# Patient Record
Sex: Female | Born: 1955
Health system: Southern US, Community
[De-identification: ages and names within clinical notes are randomized; demographics above are authoritative.]

## PROBLEM LIST (undated history)

## (undated) DIAGNOSIS — N189 Chronic kidney disease, unspecified: Secondary | ICD-10-CM

## (undated) DIAGNOSIS — Z91018 Allergy to other foods: Secondary | ICD-10-CM

## (undated) DIAGNOSIS — N819 Female genital prolapse, unspecified: Secondary | ICD-10-CM

## (undated) DIAGNOSIS — M21612 Bunion of left foot: Secondary | ICD-10-CM

## (undated) DIAGNOSIS — Z9889 Other specified postprocedural states: Secondary | ICD-10-CM

## (undated) DIAGNOSIS — I480 Paroxysmal atrial fibrillation: Secondary | ICD-10-CM

## (undated) DIAGNOSIS — M21611 Bunion of right foot: Secondary | ICD-10-CM

## (undated) DIAGNOSIS — A159 Respiratory tuberculosis unspecified: Secondary | ICD-10-CM

## (undated) DIAGNOSIS — H269 Unspecified cataract: Secondary | ICD-10-CM

## (undated) DIAGNOSIS — Z9109 Other allergy status, other than to drugs and biological substances: Secondary | ICD-10-CM

## (undated) DIAGNOSIS — K219 Gastro-esophageal reflux disease without esophagitis: Secondary | ICD-10-CM

## (undated) DIAGNOSIS — Z87442 Personal history of urinary calculi: Secondary | ICD-10-CM

## (undated) DIAGNOSIS — F419 Anxiety disorder, unspecified: Secondary | ICD-10-CM

## (undated) DIAGNOSIS — T7840XA Allergy, unspecified, initial encounter: Secondary | ICD-10-CM

## (undated) DIAGNOSIS — G479 Sleep disorder, unspecified: Secondary | ICD-10-CM

## (undated) DIAGNOSIS — M1990 Unspecified osteoarthritis, unspecified site: Secondary | ICD-10-CM

## (undated) DIAGNOSIS — R011 Cardiac murmur, unspecified: Secondary | ICD-10-CM

## (undated) DIAGNOSIS — R0600 Dyspnea, unspecified: Secondary | ICD-10-CM

## (undated) DIAGNOSIS — D509 Iron deficiency anemia, unspecified: Secondary | ICD-10-CM

## (undated) DIAGNOSIS — R002 Palpitations: Secondary | ICD-10-CM

## (undated) DIAGNOSIS — Z9981 Dependence on supplemental oxygen: Secondary | ICD-10-CM

## (undated) DIAGNOSIS — I517 Cardiomegaly: Secondary | ICD-10-CM

## (undated) DIAGNOSIS — T8859XA Other complications of anesthesia, initial encounter: Secondary | ICD-10-CM

## (undated) DIAGNOSIS — R112 Nausea with vomiting, unspecified: Secondary | ICD-10-CM

## (undated) DIAGNOSIS — M79672 Pain in left foot: Secondary | ICD-10-CM

## (undated) DIAGNOSIS — E039 Hypothyroidism, unspecified: Secondary | ICD-10-CM

## (undated) DIAGNOSIS — R32 Unspecified urinary incontinence: Secondary | ICD-10-CM

## (undated) DIAGNOSIS — I10 Essential (primary) hypertension: Secondary | ICD-10-CM

## (undated) DIAGNOSIS — J189 Pneumonia, unspecified organism: Secondary | ICD-10-CM

## (undated) DIAGNOSIS — R0902 Hypoxemia: Secondary | ICD-10-CM

## (undated) DIAGNOSIS — M2550 Pain in unspecified joint: Secondary | ICD-10-CM

## (undated) DIAGNOSIS — E669 Obesity, unspecified: Secondary | ICD-10-CM

## (undated) DIAGNOSIS — I499 Cardiac arrhythmia, unspecified: Secondary | ICD-10-CM

## (undated) DIAGNOSIS — K829 Disease of gallbladder, unspecified: Secondary | ICD-10-CM

## (undated) DIAGNOSIS — R600 Localized edema: Secondary | ICD-10-CM

## (undated) DIAGNOSIS — Z96653 Presence of artificial knee joint, bilateral: Secondary | ICD-10-CM

## (undated) DIAGNOSIS — F32A Depression, unspecified: Secondary | ICD-10-CM

## (undated) DIAGNOSIS — T4145XA Adverse effect of unspecified anesthetic, initial encounter: Secondary | ICD-10-CM

## (undated) HISTORY — DX: Palpitations: R00.2

## (undated) HISTORY — PX: TONSILLECTOMY: SUR1361

## (undated) HISTORY — PX: EYE SURGERY: SHX253

## (undated) HISTORY — DX: Localized edema: R60.0

## (undated) HISTORY — DX: Hypoxemia: R09.02

## (undated) HISTORY — DX: Chronic kidney disease, unspecified: N18.9

## (undated) HISTORY — DX: Pain in unspecified joint: M25.50

## (undated) HISTORY — DX: Female genital prolapse, unspecified: N81.9

## (undated) HISTORY — DX: Pain in left foot: M79.672

## (undated) HISTORY — PX: APPENDECTOMY: SHX54

## (undated) HISTORY — DX: Unspecified osteoarthritis, unspecified site: M19.90

## (undated) HISTORY — DX: Allergy, unspecified, initial encounter: T78.40XA

## (undated) HISTORY — DX: Paroxysmal atrial fibrillation: I48.0

## (undated) HISTORY — DX: Cardiomegaly: I51.7

## (undated) HISTORY — DX: Iron deficiency anemia, unspecified: D50.9

## (undated) HISTORY — DX: Bunion of left foot: M21.611

## (undated) HISTORY — DX: Allergy to other foods: Z91.018

## (undated) HISTORY — DX: Depression, unspecified: F32.A

## (undated) HISTORY — DX: Gastro-esophageal reflux disease without esophagitis: K21.9

## (undated) HISTORY — PX: CHOLECYSTECTOMY: SHX55

## (undated) HISTORY — DX: Dyspnea, unspecified: R06.00

## (undated) HISTORY — DX: Presence of artificial knee joint, bilateral: Z96.653

## (undated) HISTORY — DX: Obesity, unspecified: E66.9

## (undated) HISTORY — DX: Anxiety disorder, unspecified: F41.9

## (undated) HISTORY — DX: Unspecified cataract: H26.9

## (undated) HISTORY — PX: TUBAL LIGATION: SHX77

## (undated) HISTORY — DX: Disease of gallbladder, unspecified: K82.9

## (undated) HISTORY — DX: Bunion of left foot: M21.612

## (undated) HISTORY — DX: Bunion of right foot: M21.611

---

## 1997-04-16 HISTORY — PX: JOINT REPLACEMENT: SHX530

## 1998-02-21 ENCOUNTER — Inpatient Hospital Stay (HOSPITAL_COMMUNITY): Admission: RE | Admit: 1998-02-21 | Discharge: 1998-02-25 | Payer: Self-pay | Admitting: Orthopedic Surgery

## 1998-02-21 ENCOUNTER — Encounter: Payer: Self-pay | Admitting: Orthopedic Surgery

## 1998-03-14 ENCOUNTER — Encounter: Admission: RE | Admit: 1998-03-14 | Discharge: 1998-06-12 | Payer: Self-pay | Admitting: Orthopedic Surgery

## 2006-09-23 ENCOUNTER — Inpatient Hospital Stay (HOSPITAL_COMMUNITY): Admission: EM | Admit: 2006-09-23 | Discharge: 2006-09-30 | Payer: Self-pay | Admitting: Emergency Medicine

## 2006-09-23 ENCOUNTER — Ambulatory Visit: Payer: Self-pay | Admitting: Pulmonary Disease

## 2006-10-03 ENCOUNTER — Ambulatory Visit: Payer: Self-pay | Admitting: Internal Medicine

## 2006-10-16 ENCOUNTER — Ambulatory Visit: Payer: Self-pay | Admitting: Internal Medicine

## 2006-11-11 ENCOUNTER — Ambulatory Visit: Payer: Self-pay | Admitting: Internal Medicine

## 2006-11-11 LAB — CONVERTED CEMR LAB
BUN: 7 mg/dL (ref 6–23)
Calcium: 9.5 mg/dL (ref 8.4–10.5)
Chloride: 102 meq/L (ref 96–112)
Creatinine, Ser: 0.7 mg/dL (ref 0.4–1.2)
Pro B Natriuretic peptide (BNP): 72 pg/mL (ref 0.0–100.0)
Sed Rate: 72 mm/hr — ABNORMAL HIGH (ref 0–25)

## 2006-11-15 ENCOUNTER — Ambulatory Visit: Payer: Self-pay | Admitting: Internal Medicine

## 2006-11-15 ENCOUNTER — Ambulatory Visit: Payer: Self-pay | Admitting: Emergency Medicine

## 2006-11-16 ENCOUNTER — Ambulatory Visit (HOSPITAL_BASED_OUTPATIENT_CLINIC_OR_DEPARTMENT_OTHER): Admission: RE | Admit: 2006-11-16 | Discharge: 2006-11-16 | Payer: Self-pay | Admitting: Emergency Medicine

## 2006-11-26 ENCOUNTER — Ambulatory Visit: Payer: Self-pay

## 2006-11-27 ENCOUNTER — Ambulatory Visit: Payer: Self-pay | Admitting: Pulmonary Disease

## 2006-12-12 ENCOUNTER — Ambulatory Visit: Payer: Self-pay | Admitting: Emergency Medicine

## 2006-12-17 ENCOUNTER — Ambulatory Visit: Payer: Self-pay | Admitting: Internal Medicine

## 2006-12-27 ENCOUNTER — Ambulatory Visit: Payer: Self-pay | Admitting: Emergency Medicine

## 2007-01-03 ENCOUNTER — Ambulatory Visit: Payer: Self-pay | Admitting: Emergency Medicine

## 2007-03-19 ENCOUNTER — Ambulatory Visit: Payer: Self-pay | Admitting: Emergency Medicine

## 2007-04-21 ENCOUNTER — Ambulatory Visit: Payer: Self-pay | Admitting: Emergency Medicine

## 2007-04-21 DIAGNOSIS — R0902 Hypoxemia: Secondary | ICD-10-CM | POA: Insufficient documentation

## 2007-04-21 DIAGNOSIS — I10 Essential (primary) hypertension: Secondary | ICD-10-CM | POA: Insufficient documentation

## 2007-04-21 DIAGNOSIS — K219 Gastro-esophageal reflux disease without esophagitis: Secondary | ICD-10-CM | POA: Insufficient documentation

## 2007-04-21 DIAGNOSIS — E678 Other specified hyperalimentation: Secondary | ICD-10-CM | POA: Insufficient documentation

## 2007-05-20 ENCOUNTER — Ambulatory Visit: Payer: Self-pay | Admitting: Emergency Medicine

## 2007-07-16 ENCOUNTER — Ambulatory Visit: Payer: Self-pay | Admitting: Emergency Medicine

## 2007-07-16 DIAGNOSIS — J8409 Other alveolar and parieto-alveolar conditions: Secondary | ICD-10-CM | POA: Insufficient documentation

## 2007-08-06 ENCOUNTER — Encounter: Payer: Self-pay | Admitting: Emergency Medicine

## 2007-08-18 ENCOUNTER — Ambulatory Visit: Payer: Self-pay | Admitting: Emergency Medicine

## 2007-09-17 ENCOUNTER — Ambulatory Visit: Payer: Self-pay | Admitting: Internal Medicine

## 2007-10-16 ENCOUNTER — Ambulatory Visit: Payer: Self-pay | Admitting: Emergency Medicine

## 2007-11-17 ENCOUNTER — Ambulatory Visit: Payer: Self-pay | Admitting: Internal Medicine

## 2007-11-27 ENCOUNTER — Telehealth (INDEPENDENT_AMBULATORY_CARE_PROVIDER_SITE_OTHER): Payer: Self-pay | Admitting: *Deleted

## 2007-12-01 ENCOUNTER — Telehealth (INDEPENDENT_AMBULATORY_CARE_PROVIDER_SITE_OTHER): Payer: Self-pay | Admitting: *Deleted

## 2010-08-29 NOTE — Assessment & Plan Note (Signed)
[facility]                             PULMONARY OFFICE NOTE   Rebekah, [name]                           MRN:          [id number]  DATE:11/11/2006                            DOB:          [birth year]    HISTORY OF PRESENT ILLNESS:  Patient is a 55 year old morbidly obese  white female patient of Dr. Sherene Sires, who was recently in the hospital, June  9 through June 16 with hypoxic respiratory failure with diffuse  infiltrates on chest x-ray of unclear etiology, associated with  moderately elevated sed rate and a normal BNP.  Patient had dramatic  improvement symptomatically with empiric treatment with Avelox plus  prednisone and oxygen therapy.  Patient returns today, reporting that  she had been substantially improving to the point where she was using  oxygen mainly at bedtime and with exertional activity and had tapered  off prednisone.  Patient complains that, over the last week, she has had  a slight increase in her shortness of breath with activity and it is  noted that her weight had gone up substantially, almost at 20 pounds.  Patient has chronic lower extremity edema and feels that this has  increased, as well.  The patient denies any purulent sputum, hemoptysis,  orthopnea, or PND.   PAST MEDICAL HISTORY:  Reviewed.   CURRENT MEDICATIONS:  Reviewed.   PHYSICAL EXAM:  Patient is a morbidly obese female, in no acute  distress.  She is afebrile with stable vital signs, O2 saturation is 96% on 4 L.  HEENT:  Unremarkable.  NECK:  Supple without cervical adenopathy, no JVD.  LUNG SOUNDS:  Reveal diminished breath sounds in the bases without any  wheezing or crackles.  CARDIAC:  Regular rate and rhythm.  ABDOMEN:  Soft and nontender.  EXTREMITIES:  Warm without any calf cyanosis, clubbing.  There is venous  insufficiency and chronic stasis dermatitis changes with 2+ edema  bilaterally.   IMPRESSION AND PLAN:  Hypoxemic respiratory failure with  pulmonary  infiltrate of unclear etiology.  Patient does appear to have a component  of volume overload.  Will increase Lasix up to 40 mg daily over the next  four days and lab work, including a BMET, BNP and sed rate are pending.  Patient has a followup appointment here in four days with Dr. Sherene Sires, will  follow up accordingly.  Patient is recommended to use oxygen at 2 L at  rest and 4 L with activity  continuously.  Patient is to contact the office if symptoms do not  improve or worsen for sooner followup.      Rubye Oaks, NP  Electronically Signed      Charlaine Dalton. Sherene Sires, MD, [facility]  Electronically Signed   TP/MedQ  DD: 11/12/2006  DT: 11/13/2006  Job #: [id number]

## 2010-08-29 NOTE — Assessment & Plan Note (Signed)
[facility]                             PULMONARY OFFICE NOTE   TANDRA, ROSADO                           MRN:          [id number]  DATE:12/27/2006                            DOB:          [birth year]    Ms. Francom is a 55 year old woman with obesity-hypoventilation syndrome,  hypertension, gastroesophageal reflux.  She follows up today for a  regularly scheduled office visit and to follow up some recent testing.  She was admitted to the hospital in June, 2008 for hypoxemic respiratory  failure in the setting of acute bilateral infiltrates.  She was treated  for a presumed cryptogenic organizing pneumonia versus community-  acquired pneumonia.  She is currently off corticosteroids.  Unfortunately, she has continued to have significant exertional dyspnea  since that hospitalization.  We have performed a 6 minute walk,  echocardiography, and a sleep study to evaluate her further.  She has  also had a CT scan of her chest performed, as detailed below.   CURRENT MEDICATIONS:  1. Lexapro 20 mg daily.  2. Potassium chloride 20 mEq daily.  3. Metaclopramide 10 mg q.a.m. and q.p.m.  4. Prilosec 20 mg b.i.d.  5. Oxybutinin 5 mg t.i.d.  6. Oxygen at 2 liters per minute with activity and with sleep.  7. Benicar 40 mg daily.  8. Lasix 40 mg daily.  9. Centrum Silver once daily.  10.Tramadol p.r.n.  11.Zolpidem p.r.n.  12.Albuterol 2 puffs q.4h. p.r.n. shortness of breath.   PHYSICAL EXAMINATION:  GENERAL:  This pleasant, well-appearing,  overweight woman who is in no distress on room air.  Her weight is 351 pounds, which is down 16 pounds from December 12, 2006.  Her temperature is 98.5, blood pressure 130/90, heart rate 80, SpO2 95%  on 2 liters per minute nasal cannula at rest and 93% on room air at  rest.  HEENT:  Oropharynx is clear.  NECK:  Supple without lymphadenopathy or stridor.  LUNGS:  Distant with some decreased lung volumes, but there are no  crackles or wheezes.  HEART:  Regular without murmur.  ABDOMEN:  Obese, soft and nontender with positive bowel sounds.  EXTREMITIES:  No significant edema.   STUDIES:  Polysomnogram performed on November 16, 2006 showed no apneas or  hypopneas.  There was mild-to-moderate snoring noted.  She did not have  any arrhythmias or leg jerks.  Her saturations remained above 94% while  wearing 2 liters per minute by nasal cannula.   A CT scan of the chest performed on December 17, 2006 showed no evidence  of interstitial infiltrates.  There was some mild lingular scarring and  some changes consistent with mild bronchitic inflammation.   Echocardiogram performed on August 12th showed normal left ventricular  size and function with an ejection fraction for approximately 55-60%.  There was mild focal basal septal hypertrophy and mild mitral valvular  regurgitation.  The estimated peak arterial pressure was mildly  increased at 39 mmHg, consistent with mild pulmonary hypertension.   IMPRESSION:  1. Obesity-hypoventilation syndrome with exertional hypoxemia:  Her  polysomnogram did not support obstructive sleep apnea, and I feel      that we do have her adequately oxygenated at night on 2 liters per      minute.  Likewise, her echocardiogram is reassuring with the      exception of some mild pulmonary hypertension.  I believe we need      to keep her well oxygenated, both at night and with exertion.  We      will perform an ambulatory oximetry today to assure that 2 liters      per minute is adequate.  She has been debilitated by her acute-on-      chronic pulmonary disease and has not returned to her previous      functional capacity since her hospitalization in June.  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  At this time, I do not believe she can walk greater than      200 feet or lift a child, as mentioned.  Certainly, she is not      allowed to exert herself without her supplemental oxygen.  We have      initiated the process of obtaining total disability from work.  I      have filled out paper work to that affect with the dates to include      December 10, 2006 through the end of the current school term, which      is October 14, 2007.  We will reassess her functional status at that      time.  One of her goals is to potentially be able to return to      work, and if she improves, such that she can go back, we will get      rid of her disability at that time.  Alternatively if her job's      requirements change, she may be able to return to a different type      of work.  2. Gastroesophageal reflux disease:  I will discontinue her Reglan and      decrease her Prilosec to once daily.  3. History of cryptogenic organizing pneumonia without any evidence of      recurrence or persistent disease on her CT scan of the chest.     Leslye Peer, MD  Electronically Signed    RSB/MedQ  DD: 01/16/2007  DT: 01/16/2007  Job #: [id number]

## 2010-08-29 NOTE — Assessment & Plan Note (Signed)
[facility]                             PULMONARY OFFICE NOTE   Rebekah [name], Rebekah [name]                           MRN:          [id number]  DATE:01/03/2007                            DOB:          03-19-1956    HISTORY OF PRESENT ILLNESS:  Patient is a morbidly obese 55 year old  female patient of Dr. Delton Coombes who has a known history of  morbid obesity,  hypertension, gastroesophageal reflux disease, and hypoxemia who  presents today due to recent increased hair loss.  Patient reports that  she has noticed over the last several weeks that she has had increased  hair loss up to the point where she lost a large amount of hair over the  weekend.  She was advised by her pharmacist that possibly Benicar could  be causing this.  Patient had previously been switched off ACE inhibitor  over to Benicar several months ago.  Patient's pharmacist did recommend  that in the same class, Diovan does not typically contribute to  alopecia.  Patient denies any recent hair products, heat or cold  intolerances, abdominal pain, nausea, or vomiting.   PAST MEDICAL HISTORY:  Reviewed.   CURRENT MEDICATIONS:  Reviewed.   PHYSICAL EXAMINATION:  Patient is a morbidly obese female in no acute  distress.  She is afebrile with stable vital signs.  Her O2 saturation is 96% on  2L.  HEENT:  Unremarkable.  Hair pattern without any significant patches of  alopecia, and no lesions are noted.  NECK:  Supple without adenopathy.  No JVD.  No palpable thyromegaly.  LUNG FIELDS:  Clear in the bases.  CARDIAC:  Regular rate.  ABDOMEN:  Morbidly obese and soft.  EXTREMITIES:  Warm without any calf cyanosis or clubbing.  There is  chronic stasis dermatitic changes with 1+ edema.   IMPRESSION AND PLAN:  1. Recent increased hair loss.  Possibly secondary to Benicar.  It      should be fine to switch Benicar over to Diovan 160 mg daily.      Samples and prescription were given.  Patient has been  recommended      to follow back up with her primary care for routine followup.  She      has not been see there by her primary care practice in several      months, and have recommended she follow up for this problem.  2. Hypertension.  Patient had previously been intolerant to ACE      inhibitors, and had been changed off ACE inhibitor to Benicar, now      possibly having adverse effect to Benicar.  She has been changed      over to Diovan 160.  She will continue to follow up with her      primary care physician for this as well.  3. Hypoxemia, on oxygen with activity and at rest.  Patient is pending      an overnight oximetry.      We will follow up accordingly.  Patient is to follow back up with  Dr. Delton Coombes as scheduled, or sooner if needed.      Rubye Oaks, NP  Electronically Signed      Leslye Peer, MD  Electronically Signed   TP/MedQ  DD: 01/03/2007  DT: 01/03/2007  Job #: [id number]

## 2010-08-29 NOTE — Assessment & Plan Note (Signed)
[facility]                             PULMONARY OFFICE NOTE   Rebekah [name], Rebekah [name]                           MRN:          [id number]  DATE:12/12/2006                            DOB:          01-17-1956    HISTORY OF PRESENT ILLNESS:  Patient is a 55 year old woman with a known  history of morbid obesity, hypertension and gastroesophageal reflux  disease.  Patient was hospitalized earlier this year in June, June 9  through June 16 with hypoxic respiratory failure with diffuse  infiltrates on chest x-ray of unclear etiology associated with a  moderately elevated sed rate and normal BNP.  Patient was seen in the  office 4 weeks ago by Dr. Delton Coombes.  At that time continued to have  progressive shortness of breath with activity and exertional hypoxemia  with O2 saturations decreasing down into the mid 80s with ambulation on  room air.  Patient is maintained on 2 liters of O2 at rest and 4 liters  of O2 with activity.  Patient had been tapered off of prednisone  completely and chest x-ray showed a resolution of her interstitial  infiltrates.  Sed rate was elevated at 72 and her BNP was also at 72.  Patient was set up for a 2D echo which showed normal left ventricular  systolic function with an EF of 55% - 60% with some mild focal basal  septal hypertrophy.  There was some mild mitral valve regurgitation and  estimated peak pulmonary artery systolic pressures were mildly increased  at 39.  Patient underwent a sleep study on November 16, 2006 which showed  no events and decreased REM sleep with mild to moderate snoring.  O2  saturation maintained at 94% on 2 liters.  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  Patient reports by the time she gets up,  gets dressed and gets to school she is short of breath.  Patient's  job  does require activities such as helping students, changing students and  she does have quite a bit of difficulty with wearing out and shortness  of breath.  Patient had to leave work yesterday due to shortness of  breath.  Patient denies any associated chest pain, palpitations,  headache, visual changes, increased lower extremity swelling.   PAST MEDICAL HISTORY:  Reviewed.   CURRENT MEDICATIONS:  Reviewed.   PHYSICAL EXAMINATION:  Patient is a morbidly obese female in no acute  distress.  Weight is down 11 pounds at 367.  HEENT:  Unremarkable.  NECK:  Supple without cervical adenopathy.  No JVD.  LUNGS:  Sounds reveal diminished breath sounds at the bases.  CARDIAC:  Regular rate.  ABDOMEN:  Soft and morbidly obese with a large panniculus.  EXTREMITIES:  Warm without any calf tenderness, cyanosis, clubbing.  There is chronic stasis dermatitic changes with 1-2+ edema.   IMPRESSION AND PLAN:  Recent admission with acute on chronic respiratory  failure in the  setting of interstitial infiltrates which is now resolved  on chest x-ray.  Patient continues to have an elevated sed rate which is  unclear of its etiology and patient continues to have chronic hypoxemia.  Patient's sleep study did not show any significant obstructive sleep  apnea.  However, suspect patient has a component of obesity  hypoventilation syndrome which may be contributing to her persistent  hypoxemia.  and a component of mild pulmonary hypertension with elevated  pressures on recent 2D echo.  Patient is to continue on oxygen therapy  at 2 liters at rest and 4 liters with activity and her present regimen.  Patient is at risk due to her morbid obesity for possible underlying  pulmonary embolism.  We will go ahead and check a CT of the chest to  rule this out.  Patient will return back here as scheduled on September  12 with Dr. Delton Coombes for a followup.  Patient has been given a work note  today and suspect patient  will be unable to fill her work obligations,  will need to seek disability.      Rubye Oaks, NP  Electronically Signed      Leslye Peer, MD  Electronically Signed   TP/MedQ  DD: 12/12/2006  DT: 12/13/2006  Job #: [id number]

## 2010-08-29 NOTE — Discharge Summary (Signed)
NAME:  Rebekah [name], Rebekah [name]                  ACCOUNT NO.:  [id number]   MEDICAL RECORD NO.:  [id number]          PATIENT TYPE:  INP   LOCATION:  1416                         FACILITY:  [facility]   PHYSICIAN:  Charlaine Dalton. Sherene Sires, MD, FCCPDATE OF BIRTH:  05-26-1955   DATE OF ADMISSION:  09/23/2006  DATE OF DISCHARGE:  09/30/2006                               DISCHARGE SUMMARY   DISCHARGE DIAGNOSES:  1. Hypoxic respiratory failure in the setting of acute interstitial      lung disease of unclear etiology.  2. Obesity hypoventilation syndrome.  3. Volume overload.  4. Steroid-induced hyperglycemia.  5. Hypertension.   LABORATORY DATA:  Blood cultures both obtained on September 23, 2006 negative.  September 29, 2006 BNP 61.6.  September 29, 2006 white blood cell count 11.7,  hemoglobin 9, hematocrit 29.8, platelet count 218.  September 29, 2006 sodium  140, potassium 5, chloride 95, CO2 39, glucose 138, BUN 30, creatinine  0.86.  September 27, 2006 ESR 40, BNP 67.8.  Urinary strep antigen on September 25, 2006 negative.  Sputum per AFB on September 24, 2006 demonstrating no  acid fast bacilli (prelim).   BRIEF HISTORY:  This is a 55 year old morbidly obese female who was in  her usual state of health up until approximately seven days prior to  admission when she developed fever, productive cough with purulent  sputum, associated shortness of breath, wheezing.  She was treated by  her primary care physician in [place] with Levaquin.  Her symptoms  did not improve.  She presented with progressive shortness of breath and  cyanosis with marked hypoxia and saturations in the high 60s to low 70s  on presentation.  She was admitted for further evaluation and therapy.   HOSPITAL COURSE BY DISCHARGE DIAGNOSES:  Hypoxic respiratory failure in  the setting of acute interstitial pulmonary infiltrates of unclear  etiology.  [name] was treated broad spectrum to cover for both  pneumonia and bronchiolitis obliterans and organizing pneumonia.   She  has had a history in the past of positive PPD, but has received  chemoprophylaxis in the past.  The sputum was sent for AFB which is  negative to date.  She was treated initially with Rocephin.  This was  changed later to Avelox and Ceftin.  She will complete a total of 10-day  course of antibiotics which will necessitate three more days of Avelox  in the outpatient setting.  Additionally she was given IV systemic  steroids.  She has currently been weaned down to 40 of prednisone orally  daily.  She will complete three more days of 40 mg prednisone on a daily  basis, then will continue 20 mg daily until adjusted by Dr. Sherene Sires.  Upon  time of discharge her final chest x-ray on September 29, 2006 demonstrates  cardiomegaly with bilateral pulmonary infiltrates.  She clearly has an  element of volume overload with room for diuresis based on her blood  pressure and renal function.  Therefore, she will be discharged to home  on the following regimen.   1. Supplemental oxygen at  2 L at rest with orders to increase this to      4 L with exertion.  Ms. Kendricks was ambulated in the hallway.  She      was noted to have resting room air saturations of 86%.  She was      also noted to have desaturations down to 85 to 86% on 4 L nasal      cannula with exertion after ambulating approximately 150 feet.      This did rebound to the mid 90s after less than 30 seconds of rest.      She will be therefore sent home on 2 L at rest with orders to      increase to 4 L with exertion.  Additionally she will continue      three more days of Avelox, slow prednisone taper, and instructed to      use her albuterol MDI 2 puffs every 4-6 hours on an as needed      basis.  2. Obesity hypoventilation syndrome clearly contributing to her      resting hypoxia. She was instructed on incentive spirometry and      will be discharged with instructions on focus on weight loss.      Additionally she will continue supplemental  oxygen at all times.      Given her body morphology, would certainly consider outpatient PSG      for surveillance of sleep apnea.  3. Fluid volume overload.  Plan for this is to discharge [name]      home on a daily fixed dose of Lasix.  We will supplement her      potassium empirically with this.  4. Hyperglycemia.  Glucose currently fasting in the 80s.  She will be      discharged to home without a hyperglycemia regimen.  However, will      need followup in the outpatient setting to make sure this resolves.  5. Hypertension.  Plan for this is to discharge to home on Benicar and      Lasix.   DISCHARGE INSTRUCTIONS:  1. Low sodium diet.  2. Oxygen at 2 L with rest and increase to 4 L with activity.      Followup nurse practitioner Rubye Oaks, NP Thursday the 19th;      also with Dr. Sherene Sires on June 27th.   DISCHARGE MEDICATIONS:  1. Prednisone 10 mg tabs, four tabs a day x3 days, then decrease to      two times a day and hold.  2. Lexapro 20 mg tab daily.  3. Benicar 20 mg tab daily.  4. Prilosec 20 mg tab twice a day.  5. Avelox 400 mg tab daily x3 days.  6. Furosemide 20 mg tab daily.  7. K-Dur 20 mEq tab daily.  8. Reglan 10 mg p.o. before meals and before bed.  9. Ultram 50 mg tab every six hours as needed for pain.  10.Oxybutynin 5 mg tab every eight hours as needed.  11.Ambien 5 mg tab before bed p.r.n. insomnia.  12.Albuterol MDI 2 puffs every 4-6 hours p.r.n. shortness of breath.   Upon time of discharge [name] has had intensive instructions on  outpatient therapy, discharge instructions, and when to call physician.  She will be discharged to home in improved condition having reached  maximum inpatient benefit of hospital care.      Zenia Resides, NP      Charlaine Dalton. Sherene Sires, MD, [facility]  Electronically  Signed    PB/MEDQ  D:  09/30/2006  T:  09/30/2006  Job:  [id number]  cc:   Rubye Oaks, NP

## 2010-08-29 NOTE — H&P (Signed)
NAME:  Rebekah [name], Rebekah [name]                  ACCOUNT NO.:  [id number]   MEDICAL RECORD NO.:  [id number]          PATIENT TYPE:  INP   LOCATION:  1226                         FACILITY:  [facility]   PHYSICIAN:  Oley Balm. Sung Amabile, MD   DATE OF BIRTH:  1955/10/02   DATE OF ADMISSION:  09/23/2006  DATE OF DISCHARGE:                              HISTORY & PHYSICAL   ADMISSION DIAGNOSES:  1. Hypoxia respiratory failure.  2. Suspected post infectious pneumonitis.  3. Wheezing with no prior history of asthma.  4. Obesity.  5. History of positive PPD.   HISTORY OF PRESENT ILLNESS:  Rebekah [name] is a 55 year old woman who  generally enjoys reasonably good health and was in her usual state of  health until approximately 6 or 7 days prior to admission when she  developed fever, productive cough, purulent sputum, shortness of breath  and wheezing.  On the day after the onset of these symptoms, she called  her primary care physicians in [place], [place], and received  levofloxacin.  Subsequently, she was evaluated, and with the above  symptoms and finding she was started on prednisone, albuterol, Azmacort  and Tussionex.  She called her primary care physicians on the morning of  this admission reporting that her symptoms had not improved and  consequently it was arranged for her to see Dr. Sherene Sires in the office on  the day following this admission.  However, she had progressive  shortness of breath and cyanosis and consequently presented to the  emergency department for further evaluation where she was found to be  markedly hypoxic with oxygen saturations in the high 60s and low 70s on  room air.  Her fevers resolved yesterday.  She continues to have  purulent sputum but this has improved.  She denies pleuritic and anginal  chest pain.  She has no nausea, vomiting, diarrhea, or dysuria.  She  denies headaches and lower extremity edema.  She has had no calf  tenderness.   PAST MEDICAL HISTORY:  1.  Congenital deafness in the left ear.  2. Tonsillectomy and adenoidectomy at age 19.  3. Appendectomy at age 69.  4. Bilateral tubal ligation at age 82.  5. Laparoscopic cholecystectomy at age 52.  6. Left total knee replacement at approximately age 52.  7. Hypertension.  8. Depression.  9. Gastroesophageal reflux disease.  10.Seasonal allergic allergies.  11.Infrequent bouts of bronchitis.  12.Episode of pneumonia in this past winter.   CURRENT MEDICATIONS:  1. Lotrel 10/40 mg daily.  2. Benicar HCT 20/12.5 mg daily.  3. Lexapro 20 mg daily.  4. Oxybutynin 5 mg t.i.d. p.r.n.  5. Prilosec 20 mg once or twice a day when she is using nonsteroidal      which she takes for arthritic pain.  6. Hydrochlorothiazide 12.5 mg p.r.n. for increased lower extremity      edema.   More recently, as stated above, she has been started on Levaquin,  prednisone Dosepak, albuterol inhaler, Azmacort inhaler, Tussionex.   SOCIAL HISTORY:  She has never smoked.  She is a retired Astronomer. and in  fact used to work at [employer]  Presently, she is a IT trainer.  Notably, she has a history of positive PPD and she  did take isoniazid for 6 months.  She has never smoked.   FAMILY HISTORY:  This is noncontributory.   REVIEW OF SYSTEMS:  This is as per the history of present illness and is  only positive otherwise for a chronic nagging cough which has  persisted over the past several months, but is distinctly different in  character than her current productive cough.   PHYSICAL EXAMINATION:  VITAL SIGNS:  Temperature is 98.3, blood pressure  154/86.  Pulse was 119 initially and improved to 99 after rest.  Respirations are 20-25 and unlabored.  Oxygen saturation is 94% on face  mask oxygen but drops to 68% on room air.  HEENT:  Reveals no acute abnormalities.  NECK:  Supple without adenopathy.  Jugular venous pulsations cannot be  visualized.  CHEST:  Examination reveals a normal  percussion note throughout.  Breath  sounds are full throughout with diffuse coarse crackles and expiratory  wheezes diffusely.  There are no findings of consolidation.  CARDIAC:  Exam reveals a regular rate and rhythm with no murmurs heard.  ABDOMEN:  Markedly obese, soft, nontender, with normal bowel sounds.  EXTREMITIES:  Without clubbing, cyanosis or edema.  NEUROLOGIC:  Exam reveals no focal deficits.   DATA:  Her chest x-ray reveals cardiomegaly with diffuse bilateral  airspace disease consistent with edema versus diffuse pneumonitis versus  ARDS.  Laboratory data is notable for a white blood cell count 28,800  with 74% neutrophils.  Hemoglobin is 9.8, hematocrit 32.6%, platelet  count is normal.  Chemistries are normal except a glucose of 176.  B-  natriuretic peptide is 158.  Cardiac markers are negative.  EKG reveals  no acute ischemic changes.   IMPRESSION:  Hypoxic respiratory failure with bilateral pulmonary  infiltrates - clinical history is consistent with pneumonia and she was  very appropriately treated with levofloxacin.  The radiographic picture  now is suggestive of a diffuse pneumonitis picture.  I suspect that the  primary problem now is a post infectious pneumonitis such as  bronchiolitis obliterans and organizing pneumonia.  She also has airways  involvement with diffuse wheezing.  Of note that she has a positive PPD  but I highly doubt that this is a reactivation tuberculosis.  The  clinical and radiographic pictures are not right for this and she did  receive chemoprophylaxis to prevent TB reactivation which is nearly  universally effective.   PLAN:  She will be admitted to the step-down unit for close monitoring  as she is at some risk of requiring ventilatory support.  If her first  24 hours are unremarkable, she can probably be transferred to a regular  floor.  She needs oxygen therapy.  Her activities are going to have to  be limited until her respiratory  status improves.  Therefore, DVT  prophylaxis will be administered.  For the pneumonitis, I am going to  continue treating her with antibiotics - ceftriaxone plus doxycycline -  as well as empiric steroids with Solu-Medrol 80 mg IV q.12h.  Because  of her obesity and the steroids, she will need insulin coverage.  Further evaluation and management will be dictated by her clinical  course.  Of course, sputum Gram stain and culture will be obtained as  well as AFB smears and cultures.  My index of suspicion for tuberculosis  is so low that I do not believe that she requires respiratory isolation.      Oley Balm Sung Amabile, MD  Electronically Signed     DBS/MEDQ  D:  09/23/2006  T:  09/24/2006  Job:  [id number]   cc:   Samuel Jester  Fax: [phone]   Prudy Feeler, P.A.  [facility]  [place], [place] B. Sherene Sires, MD, FCCP  [address]  [place] [address]

## 2010-08-29 NOTE — Procedures (Signed)
NAME:  Rebekah, [name] NO.:  [id number]   MEDICAL RECORD NO.:  [id number]          PATIENT TYPE:  OUT   LOCATION:  SLEEP CENTER                 FACILITY:  [facility]   PHYSICIAN:  Barbaraann Share, MD,FCCPDATE OF BIRTH:  05-23-54   DATE OF STUDY:  11/16/2006                            NOCTURNAL POLYSOMNOGRAM   REFERRING PHYSICIAN:  Leslye Peer, MD   INDICATION FOR STUDY:  786.09, other respiratory disturbance.   EPWORTH SLEEPINESS SCORE:  8   MEDICATIONS:   SLEEP ARCHITECTURE:  The patient had total sleep time of 255 minutes  with mildly decreased slow-wave sleep for age. However, never achieved  REM. Sleep onset latency was mildly prolonged at 35 minutes. Sleep  efficiency was decreased at 71%.   RESPIRATORY DATA:  The patient had no obstructive or central events  during the entire evening. There was mild-to-moderate snoring noted  throughout, however.   OXYGEN DATA:  Low 02 saturation during the night was 94% with the  patient wearing 2 liters of nasal oxygen that she normally wears at  home.   CARDIAC DATA:  No clinically significant cardiac arrhythmias were noted.   MOVEMENT-PARASOMNIA:  None   IMPRESSIONS-RECOMMENDATIONS:  No clinically significant sleep disordered  breathing noted during the night, but there was mild-to-moderate  snoring. Even though the patient never achieved REM during the night,  she had more than ample opportunity to exhibit clinically significant  sleep disordered breathing that may be contributing to pulmonary  hypertension.      Barbaraann Share, MD,FCCP  Diplomate, American Board of Sleep  Medicine  Electronically Signed    KMC/MEDQ  D:  11/27/2006 16:56:13  T:  11/28/2006 21:45:02  Job:  [id number]

## 2010-08-29 NOTE — Assessment & Plan Note (Signed)
[facility]                             PULMONARY OFFICE NOTE   SIERA, BEYERSDORF                           MRN:          [id number]  DATE:10/16/2006                            DOB:          02-16-1956    PULMONARY/EXTENDED POST HOSPITAL FOLLOWUP VISIT   HISTORY:  A 55 year old white female admitted June 9 through June 16  with hypoxemic respiratory failure with diffuse infiltrates on chest x-  ray of unclear etiology associated with moderate elevation of sed rate  and a normal BNP.  She had dramatic improvement symptomatically with  empiric treatment with Avelox plus prednisone.  It is not tapered down  to 20 mg a day.  She still maintains oxygen at 2 L at rest, 4 L with  activity, but overall feels 80% back to normal.  She has minimum cough  with no excess sputum production without orthopnea, PND or leg swelling.   Interestingly she is worked up for a chronic cough by ENT physician  after a pneumonia (clinical diagnosis with no x-ray) was made in  December 2007.  Her symptoms then consisted of a persistent hoarseness,  sensation of fatigue, dyspnea and dry cough.  ENT physician told her  that the problem was just that she was coughing too much (note that she  was on Ace-inhibitors which the ENT physician may have not been aware  of).  She has not been off of the Ace-inhibitor since she was  hospitalized and is 100% back to baseline except for the fact that she  is still mildly short of breath with exertion (oxygen dependent now).   For full inventory of medication please see face sheet dated October 16, 2006, correct as listed.   PHYSICAL EXAMINATION:  She is a pleasant ambulatory white female in no  acute distress.  She has stable vital signs, except for a blood pressure 140/96.  HEENT:  Unremarkable.  Pharynx clear.  LUNG FIELDS:  Clear to auscultation and percussion.  HEART:  There is a rate and rhythm without murmur, rub, gallop.  ABDOMEN:   Soft, benign.  EXTREMITIES:  Warm without calf tenderness, cyanosis, clubbing or edema.   IMPRESSION:  Pulmonary infiltrates with hypoxemia of undetermined  chronicity.  She is convinced her symptoms date all the way back to  December 2007, but note that the clinical diagnosis of pneumonia was  made without an x-ray and a persistent cough, dyspnea and hoarseness  were probably all related to Ace-inhibitors following this clinical  diagnosis of pneumonia she abruptly deteriorated a week prior to  admission and I believe that would be the most likely time she would  have had all the infiltrates on chest (although we do not have a  baseline to make sure that is true).  Since we do not have a baseline  chest x-ray, the burden is upon [name] to make sure that she is restored to  100% of whatever her baseline is.   For now I have recommended the following:  1. Continue Benicar but increase it to 4 mg per day and  avoid Ace-      inhibitors because of the confusion that might insure if she      resumes the Ace-inhibitor as was the case after her previous      pneumonia.  2. Taper the prednisone off for over the next 2 weeks and then follow      her up in 4 weeks to see if we see any evidence of a steroid dose      response curve in terms of her infiltrates or clinical pattern of      cough and dyspnea.  3. She does still have desaturation with exercise, which may be a      result of obesity and with the poor VQ match in the bases (although      this typically results in improvement with exercise).  Note that      now her resting sat is normal, so she certainly does not need      oxygen at rest, but needs 2 lpm with activity and 2 lpm sleeping      until her next visit.     Charlaine Dalton. Sherene Sires, MD, [facility]  Electronically Signed    MBW/MedQ  DD: 10/16/2006  DT: 10/17/2006  Job #: [id number]

## 2010-08-29 NOTE — Assessment & Plan Note (Signed)
[facility]                             PULMONARY OFFICE NOTE   LILLION, ELBERT                           MRN:          [id number]  DATE:10/03/2006                            DOB:          08-Dec-1955    HISTORY OF PRESENT ILLNESS:  The patient is a new patient to the  practice and is a 55 year old, morbidly obese, white female patient of  Dr.  who presents today for a post hospitalization visit. The patient  was admitted June 9 through September 30, 2006, for hypoxia, respiratory  failure in the setting of acute interstitial lung disease of unclear  etiology with underlying obesity, hyperventilation syndrome and  suspected volume overload. The patient had presented to the emergency  room with progressively worsening shortness of breath and marked hypoxia  with saturations in the 60s and 70s. The patient was treated initially  with broad spectrum antibiotics to cover both pneumonia and  bronchiolitis obliterans organizing pneumonia. She does have a past  history of positive PPD and received a six months treatment of INH in  the past in around 1988.  The patient's sputum was sent for AFB, which  is negative to date. She was initially treated with Rocephin and changed  over to Avelox and Ceftin. She will complete a 10-day course of ava lox  as she was also started on IV systemic steroids and was transitioned  over to oral steroids and discharged on 40 mg of prednisone. Initial  chest x-ray showed bilateral pulmonary infiltrates. Sed rate was at 40  and BNP was at 67. A urine strep antigen was negative. The patient was  placed on continuous oxygen therapy and was discharged on 2 liters with  rest and 4 liters with activity. The patient was also given IV diuresis  to combat her volume overload.   The patient returns today reporting that she is substantially improved  with decreased shortness of breath. She is maintained presently on  prednisone 40 mg and will  be tapering down to her 20 mg in the next few  days. She has also taken her last dose of Avelox and she continues on  oxygen therapy at 4 liters at activity and 2 liters at rest. The patient  is saturating well on her present settings. The patient denies any chest  pain, orthopnea, PND or increased leg swelling. The patient reports she  has according to her scale she has lost 45 pounds since her admission.   PAST MEDICAL HISTORY:  1. Hypertension.  2. Gastroesophageal reflux disease.  3. Anxiety.  4. Urinary incontinence.  5. Status post appendectomy and cholecystectomy.  6. Degenerative joint disease.  7. Status post left total knee replacement.  8. Positive PPD in 1998.  9. Status post six months of INH therapy.   CURRENT MEDICATIONS:  1. Lexapro 20 mg daily.  2. Benicar 20 mg daily.  3. Lasix 20 mg daily.  4. Klor-Con 20 mEq daily.  5. Metoclopramide 10 mg b.i.d.  6. Prednisone 40 mg daily.  7. Prilosec 20 mg b.i.d.  8. Oxybutynin 5  mg t.i.d.  9. Oxygen 2 liters at rest and 4 liters with activity.  10.Tramadol p.r.n.  11.Ambien p.r.n.  12.Albuterol p.r.n.   DRUG ALLERGIES:  No known drug allergies.   SOCIAL HISTORY:  The patient is a Runner, broadcasting/film/video, works in IT consultant.  She has two children. She is married. Lives at home with her husband.  She is a never smoker. Rare alcohol use. She has no unusual hobbies or  exposures. Has one dog, five cats and 2 rabbits inside.   REVIEW OF SYSTEMS:  Essentially negative except as noted above.   PHYSICAL EXAMINATION:  The patient is a morbidly obese female in no  acute distress. She is afebrile. Blood pressure is 172/90, O2 saturation  is 96% on 4 liters. Weight: Is at 375.  HEENT: Is unremarkable.  NECK: Supple without cervical adenopathy. No JVD.  LUNGS: Lung sounds reveal diminished breath sounds bilaterally with a  scattered bi-basilar crackles.  CARDIAC: Regular rate and rhythm.  ABDOMEN: Soft, morbidly obese and  nontender.  EXTREMITIES: Warm without any calf cyanosis or clubbing. There is trace  to 1+ edema with venous insufficiency changes.   IMPRESSION/PLAN:  1. Hypoxic respiratory failure in the setting of acute interstitial      pulmonary infiltrates of unclear etiology. The patient will finish      antibiotics as recommended. Decreased down to prednisone 20 mg and      then hold. She will follow back up here in two weeks with Dr.  Sherene Sires      as scheduled or sooner if needed.  2. Obesity, hypoventilation syndrome. The patient is encouraged on      weight loss. The patient      may need to be evaluated for a sleep study to rule out underlying      obstructive sleep apnea.  3. Volume overload. Much improved after diuresis. The patient will      continue on Lasix daily.     Rubye Oaks, NP  Electronically Signed      Charlaine Dalton. Sherene Sires, MD, [facility]  Electronically Signed   TP/MedQ  DD: 10/03/2006  DT: 10/03/2006  Job #: [id number]

## 2010-08-29 NOTE — Assessment & Plan Note (Signed)
[facility]                             PULMONARY OFFICE NOTE   Rebekah [name], Rebekah [name]                           MRN:          [id number]  DATE:03/19/2007                            DOB:          [birth year]    HISTORY OF PRESENT ILLNESS:  The patient is a 55 year old morbidly obese  female patient of Dr. Kavin Leech, who has a known history of obesity,  hypoventilation syndrome with exertional hypoxemia.  The patient did  undergo a sleep study which did not support obstructive sleep apnea.  Since last visit, the patient reports that she continues to have  shortness of breath with activity.  The patient did undergo an overnight  oximetry on January 07, 2007, which did show nocturnal hypoxemia, and  the patient is continued on oxygen therapy, 2 L, nocturnally and with  activity.  The patient denies any chest pain, palpitations, orthopnea,  PND, or leg swelling.   PAST MEDICAL HISTORY:  Reviewed.   CURRENT MEDICATIONS:  Reviewed.   PHYSICAL EXAM:  The patient is a morbidly obese female in no acute  distress.  She is afebrile with stable vital signs.  O2 saturation is 95% on room  air.  HEENT:  Unremarkable.  NECK:  Supple without cervical adenopathy.  No JVD.  LUNG SOUNDS:  Diminished in the bases, otherwise clear.  CARDIAC:  Regular rate.  ABDOMEN:  Morbidly obese, soft, and nontender.  EXTREMITIES:  Warm without any calf tenderness, clubbing.  There is  chronic statis dermatotic changes with trace to 1+ edema bilaterally.   IMPRESSION AND PLAN:  1. Obesity hypoventilation syndrome with exertional hypoxemia.  The      patient will continue on oxygen nocturnally and with activity.  I      have recommended that the patient try to slowly increase her      activity as tolerated.  I have discussed with her the possibility      of referring her to a pulmonary rehab center.  The patient reports      that she will think about this, and we will discuss this at  our      next visit with followup with Dr. Delton Coombes in 1 month or sooner if      needed.  2. Hypertension, currently well controlled and gastroesophageal reflux      disease.  The patient is currently well controlled and will      continue on her present regimen.  3. History of cryptogenic organizing pneumonia with CT scan in      September 2008, showing      mild chronic bronchitic changes with no acute findings.  The      patient will continue to follow accordingly.      Rubye Oaks, NP  Electronically Signed      Leslye Peer, MD  Electronically Signed   TP/MedQ  DD: 03/21/2007  DT: 03/21/2007  Job #: [id number]

## 2010-08-29 NOTE — Assessment & Plan Note (Signed)
[facility]                             PULMONARY OFFICE NOTE   ERNESTA, TRABERT                           MRN:          [id number]  DATE:11/15/2006                            DOB:          06/18/1955    SUBJECTIVE:  Ms. Edmonds is a 55 year old woman with obesity,  hypertension, and gastroesophageal reflux disease.  She also has a  history of a positive PPD in 1998, treated as latent TB with 6 months of  INH therapy at that time.  She was admitted to the hospital in early  June 2008 for hypoxemic respiratory failure in the setting of acute  interstitial infiltrates.  She was treated for possible pneumonia as  well as possible inflammatory interstitial process.  Finally, she also  underwent fairly aggressive diuresis.  Although the etiology of her  infiltrates was never entirely clear, she did improve clinically and  radiographically.  She has since been followed up by Dr. Sherene Sires in our  office.  Her prednisone has been tapered to off.  She remains hypoxemic,  despite the improvement in her x-ray.  She presents today to discuss  returning to work in the aftermath of her hospitalization.  She denies  any changes in her dyspnea.  She has no cough.  She believes that her  activity level is slowly improving.  Her weight is down approximately 8  pounds since a recent visit to see our nurse practitioner, Rubye Oaks, on November 11, 2006.  At that time, her Lasix was changed to 40 mg  daily from 20 mg daily.   CURRENT MEDICATIONS:  1. Lexapro 20 mg daily.  2. Lasix 40 mg daily.  3. Potassium chloride 20 mEq daily.  4. Metoclopramide 10 mg q.a.m. and 10 mg q.p.m.  5. Prilosec 20 mg b.i.d.  6. Oxybutynin 5 mg t.i.d.  7. Oxygen at 2 liters per minute with activity and sleep.  8. Benicar 40 mg daily.  9. Tramadol 50 mg q.6 h. p.r.n.  10.Zolpidem 5 mg at bedtime p.r.n.  11.Albuterol 2 puffs q.4 h. p.r.n. for shortness of breath.   PHYSICAL EXAMINATION:   GENERAL:  This is a morbid obesity woman who is  comfortable on her oxygen.  VITAL SIGNS:  Her weight is 378 pounds, down from 386 pounds at her last  visit.  Temperature 98.2, blood pressure 128/86, heart rate 103, SPO2  95% on 2 liters O2 by nasal cannula.  HEENT:  The posterior pharynx is somewhat narrow.  She has some mild  erythema.  NECK:  Supple, without lymphadenopathy or stridor.  LUNGS:  Distant but clear.  She has no wheezing on a forced expiration.  HEART:  Regular, tachycardic, without murmur.  ABDOMEN:  Obese, soft, nontender.  Positive bowel sounds.  EXTREMITIES:  Have 1+ pretibial pitting edema.   Chest x-ray from today shows resolution of her interstitial infiltrates  and some cardiomegaly.   Laboratory data from November 11, 2006 shows a sedimentation rate of 72.  Normal BMP, with the exception of an elevated glucose at 111.  Her BNP  was 72.   IMPRESSION:  1. Recent admission for acute on chronic respiratory failure in the      setting of interstitial infiltrates.  These are now resolved.      Possible etiologies include viral pneumonitis versus pulmonary      edema in the setting of diastolic dysfunction and hypertension,      versus cryptogenic organizing pneumonia.  2. Chronic hypoxemia, likely secondary to obstructive sleep apnea,      obesity hypoventilation syndrome.  3. Probable secondary pulmonary hypertension, with associated lower      extremity edema and weight gain.   PLANS:  1. I have given her permission to go back to work as long as she uses      her oxygen and undertakes limited duties, including no heavy      lifting.  2. I will continue her Lasix at 40 mg daily for now, and check a BMP      at her next visit.  3. I will arrange for a polysomnogram to evaluate for probable      obstructive sleep apnea that has contributed to her hypoventilation      and pulmonary hypertension.  4. I will order a transthoracic echocardiogram to assess her  pulmonary      arterial pressures and to evaluate for possible diastolic      dysfunction, given her hypertension.     Leslye Peer, MD  Electronically Signed    RSB/MedQ  DD: 11/18/2006  DT: 11/19/2006  Job #: [id number]   cc:   Charlaine Dalton. Sherene Sires, MD, FCCP

## 2011-01-31 LAB — CBC
HCT: 29.8 — ABNORMAL LOW
Hemoglobin: 9 — ABNORMAL LOW
Platelets: 218
WBC: 11.7 — ABNORMAL HIGH

## 2011-01-31 LAB — DIFFERENTIAL
Eosinophils Relative: 0
Lymphocytes Relative: 18
Lymphs Abs: 2.1
Monocytes Relative: 12 — ABNORMAL HIGH

## 2011-01-31 LAB — BASIC METABOLIC PANEL
Calcium: 9
GFR calc non Af Amer: >60
Potassium: 5
Sodium: 140

## 2011-01-31 LAB — B-NATRIURETIC PEPTIDE (CONVERTED LAB): Pro B Natriuretic peptide (BNP): 61.6

## 2011-02-01 LAB — BASIC METABOLIC PANEL
BUN: 30 — ABNORMAL HIGH
CO2: 29
CO2: 33 — ABNORMAL HIGH
Calcium: 9
Calcium: 9.4
Chloride: 97
Creatinine, Ser: 0.75
Creatinine, Ser: 1.02
GFR calc Af Amer: >60
GFR calc Af Amer: >60
GFR calc non Af Amer: 57 — ABNORMAL LOW
Glucose, Bld: 220 — ABNORMAL HIGH
Potassium: 3.6
Potassium: 5.2 — ABNORMAL HIGH
Sodium: 139
Sodium: 140
Sodium: 142

## 2011-02-01 LAB — CBC
HCT: 34.9 — ABNORMAL LOW
Hemoglobin: 10.7 — ABNORMAL LOW
Hemoglobin: 9.8 — ABNORMAL LOW
MCHC: 30.1
MCHC: 30.6
Platelets: 339
RBC: 4.13
RBC: 4.54
RDW: 19.2 — ABNORMAL HIGH
WBC: 11.9 — ABNORMAL HIGH
WBC: 28.8 — ABNORMAL HIGH

## 2011-02-01 LAB — CULTURE, BLOOD (ROUTINE X 2)
Culture: NO GROWTH
Culture: NO GROWTH

## 2011-02-01 LAB — B-NATRIURETIC PEPTIDE (CONVERTED LAB)
Pro B Natriuretic peptide (BNP): 158 — ABNORMAL HIGH
Pro B Natriuretic peptide (BNP): 67.8

## 2011-02-01 LAB — DIFFERENTIAL
Basophils Absolute: 0.3 — ABNORMAL HIGH
Eosinophils Relative: 0
Lymphocytes Relative: 17
Monocytes Relative: 8
Neutrophils Relative %: 74

## 2011-02-01 LAB — BLOOD GAS, ARTERIAL
O2 Content: 13
Patient temperature: 98.6
TCO2: 30.5
pCO2 arterial: 63.7
pH, Arterial: 7.322 — ABNORMAL LOW

## 2011-02-01 LAB — STREP PNEUMONIAE URINARY ANTIGEN: Strep Pneumo Urinary Antigen: NEGATIVE

## 2011-02-01 LAB — AFB CULTURE WITH SMEAR (NOT AT ARMC)

## 2011-02-01 LAB — CULTURE, RESPIRATORY W GRAM STAIN: Culture: NORMAL

## 2011-02-01 LAB — SEDIMENTATION RATE
Sed Rate: 40 — ABNORMAL HIGH
Sed Rate: 70 — ABNORMAL HIGH

## 2011-02-01 LAB — POCT CARDIAC MARKERS: CKMB, poc: 3.2

## 2012-08-31 ENCOUNTER — Encounter (HOSPITAL_COMMUNITY): Payer: Self-pay

## 2012-08-31 ENCOUNTER — Emergency Department (HOSPITAL_COMMUNITY)
Admission: EM | Admit: 2012-08-31 | Discharge: 2012-08-31 | Disposition: A | Discharge status: Home / Self Care | Payer: BC Managed Care – PPO | Attending: Emergency Medicine | Admitting: Emergency Medicine

## 2012-08-31 ENCOUNTER — Emergency Department (HOSPITAL_COMMUNITY): Payer: BC Managed Care – PPO

## 2012-08-31 DIAGNOSIS — I10 Essential (primary) hypertension: Secondary | ICD-10-CM | POA: Insufficient documentation

## 2012-08-31 DIAGNOSIS — M1990 Unspecified osteoarthritis, unspecified site: Secondary | ICD-10-CM | POA: Insufficient documentation

## 2012-08-31 DIAGNOSIS — R0602 Shortness of breath: Secondary | ICD-10-CM

## 2012-08-31 DIAGNOSIS — R0989 Other specified symptoms and signs involving the circulatory and respiratory systems: Secondary | ICD-10-CM

## 2012-08-31 DIAGNOSIS — Z79899 Other long term (current) drug therapy: Secondary | ICD-10-CM | POA: Insufficient documentation

## 2012-08-31 HISTORY — DX: Essential (primary) hypertension: I10

## 2012-08-31 HISTORY — DX: Unspecified osteoarthritis, unspecified site: M19.90

## 2012-08-31 LAB — CBC WITH DIFFERENTIAL/PLATELET
Lymphocytes Relative: 22 % (ref 12–46)
Lymphs Abs: 2 10*3/uL (ref 0.7–4.0)
Neutrophils Relative %: 68 % (ref 43–77)
Platelets: 200 10*3/uL (ref 150–400)
RBC: 4.69 MIL/uL (ref 3.87–5.11)
WBC: 9 10*3/uL (ref 4.0–10.5)

## 2012-08-31 LAB — PRO B NATRIURETIC PEPTIDE: Pro B Natriuretic peptide (BNP): 112 pg/mL (ref 0–125)

## 2012-08-31 LAB — POCT I-STAT TROPONIN I: Troponin i, poc: 0 ng/mL (ref 0.00–0.08)

## 2012-08-31 LAB — BASIC METABOLIC PANEL
CO2: 30 mEq/L (ref 19–32)
GFR calc non Af Amer: >90 mL/min (ref >90)
Glucose, Bld: 91 mg/dL (ref 70–99)
Potassium: 3.7 mEq/L (ref 3.5–5.1)
Sodium: 140 mEq/L (ref 135–145)

## 2012-08-31 NOTE — ED Notes (Signed)
MD at bedside. 

## 2012-08-31 NOTE — ED Notes (Signed)
Patient ambulated well in hall, highest O2 went to was 93% on RAand lowest it went to was 91% on RA. RN notified.

## 2012-08-31 NOTE — ED Notes (Signed)
Pt c/o sob x2 weeks.  Reports that breathing has increasingly worsened over the past couple of days.  Pt also reports that she has gained over 40lbs in the past couple of weeks

## 2012-08-31 NOTE — ED Provider Notes (Signed)
History     CSN: [id number]  Arrival date & time 08/31/12  1122   First MD Initiated Contact with Patient 08/31/12 1149      Chief Complaint  Patient presents with  . Shortness of Breath    (Consider location/radiation/quality/duration/timing/severity/associated sxs/prior treatment) HPI Comments: Patient is a 57 year old female who presents with 2 weeks of gradually worsening SOB. She states she was able to walk a few blocks, but today standing in the shower made her short of breath. Resting alleviates her symptoms. She admits to increased swelling in her legs bilaterally and a 50 lbs weight gain. She has had problems with shortness of breath in the past 5 years ago and came into the hospital with apparent oxygen saturations in the 30s. She recovered from that and does not use home O2. She denies hx of CHF, but takes lasix for her fluid. She takes less than the prescribed dose during the week because she finds the amount she urinates distressing while at work. On the weekends she takes the full 40mg  tab. She had bronchitis 1 month ago which she feels as though she recovered from. No fevers, chest pain, abdominal pain, nausea, vomiting.   Patient is a 57 y.o. female presenting with shortness of breath. The history is provided by the patient. No language interpreter was used.  Shortness of Breath Associated symptoms: no abdominal pain, no chest pain, no fever and no vomiting     Past Medical History  Diagnosis Date  . Arthritis   . Hypertension     Past Surgical History  Procedure Laterality Date  . Joint replacement    . Tonsillectomy    . Cholecystectomy    . Appendectomy    . Tubal ligation      History reviewed. No pertinent family history.  History  Substance Use Topics  . Smoking status: Never Smoker   . Smokeless tobacco: Not on file  . Alcohol Use: No    OB History   Grav Para Term Preterm Abortions TAB SAB Ect Mult Living                  Review of Systems   Constitutional: Negative for fever and chills.  Respiratory: Positive for shortness of breath.   Cardiovascular: Positive for leg swelling. Negative for chest pain.  Gastrointestinal: Negative for nausea, vomiting and abdominal pain.  All other systems reviewed and are negative.    Allergies  Propoxyphene hcl and Sulfonamide derivatives  Home Medications   Current Outpatient Rx  Name  Route  Sig  Dispense  Refill  . albuterol (PROVENTIL HFA;VENTOLIN HFA) 108 (90 BASE) MCG/ACT inhaler   Inhalation   Inhale 2 puffs into the lungs every 6 (six) hours as needed for wheezing.         Marland Kitchen amLODipine (NORVASC) 10 MG tablet   Oral   Take 10 mg by mouth daily.         . cetirizine (ZYRTEC) 10 MG tablet   Oral   Take 10 mg by mouth daily.         Marland Kitchen EPINEPHrine (EPIPEN) 0.3 mg/0.3 mL DEVI   Intramuscular   Inject 0.3 mg into the muscle once.         . escitalopram (LEXAPRO) 20 MG tablet   Oral   Take 20 mg by mouth daily.         . Fluticasone-Salmeterol (ADVAIR HFA IN)   Inhalation   Inhale 2 puffs into the lungs  2 (two) times daily.         . furosemide (LASIX) 40 MG tablet   Oral   Take 40 mg by mouth daily.         . lansoprazole (PREVACID) 30 MG capsule   Oral   Take 30 mg by mouth daily.         . naproxen (NAPROSYN) 500 MG tablet   Oral   Take 500 mg by mouth at bedtime as needed (for pain).         . potassium chloride SA (K-DUR,KLOR-CON) 20 MEQ tablet   Oral   Take 20 mEq by mouth 2 (two) times daily.         . solifenacin (VESICARE) 10 MG tablet   Oral   Take 10 mg by mouth daily.         . traMADol (ULTRAM) 50 MG tablet   Oral   Take 50 mg by mouth every 6 (six) hours as needed for pain.         . valsartan (DIOVAN) 320 MG tablet   Oral   Take 320 mg by mouth daily.           BP 178/70  Pulse 84  Temp(Src) 98.1 F (36.7 C) (Oral)  Resp 20  SpO2 94%  Physical Exam  Nursing note and vitals reviewed. Constitutional:  She is oriented to person, place, and time. She appears well-developed and well-nourished. She does not appear ill. No distress.  Morbidly obese  HENT:  Head: Normocephalic and atraumatic.  Right Ear: External ear normal.  Left Ear: External ear normal.  Nose: Nose normal.  Mouth/Throat: Oropharynx is clear and moist.  Eyes: Conjunctivae are normal.  Neck: Normal range of motion.  Cardiovascular: Normal rate, regular rhythm, normal heart sounds and normal pulses.   Significant edema in bilateral lower extremities, nonpitting  Pulmonary/Chest: Effort normal and breath sounds normal. No stridor. No respiratory distress. She has no wheezes. She has no rales.  Abdominal: Soft. She exhibits no distension.  Musculoskeletal: Normal range of motion.  Neurological: She is alert and oriented to person, place, and time. She has normal strength.  Skin: Skin is warm and dry. She is not diaphoretic. No erythema.  Psychiatric: She has a normal mood and affect. Her behavior is normal.    ED Course  Procedures (including critical care time)  Labs Reviewed  CBC WITH DIFFERENTIAL - Abnormal; Notable for the following:    MCH 25.6 (*)    RDW 15.9 (*)    All other components within normal limits  PRO B NATRIURETIC PEPTIDE  BASIC METABOLIC PANEL  POCT I-STAT TROPONIN I   Dg Chest 2 View  08/31/2012   *RADIOLOGY REPORT*  Clinical Data: Shortness of breath.  Hypertension.  CHEST - 2 VIEW  Comparison: 11/15/2006 and chest CT 12/17/2006  Findings: Stable mild cardiomegaly.  Mild prominence of the central pulmonary vascularity on the right is stable.  Thoracic aorta is stable.  There is pulmonary vascular congestion without edema. Negative for pleural effusion, airspace disease, or pneumothorax. Typical degenerative changes of the thoracic spine.  IMPRESSION: Cardiomegaly with pulmonary vascular congestion.   Original Report Authenticated By: Britta Mccreedy, M.D.     Date: 08/31/2012  Rate: 78  Rhythm:  normal sinus rhythm  QRS Axis: normal  Intervals: normal  ST/T Wave abnormalities: normal  Conduction Disutrbances:none  Narrative Interpretation:   Old EKG Reviewed: none available    1. Shortness of breath   2. Pulmonary  vascular congestion       MDM  Patient is a morbidly obese female who presents with 2 weeks of worsening dyspnea on exertion. CXR shows pulmonary vascular congestion. Labs WNL. Patient is safe to follow up as an outpatient. Take 80mg  of Lasix today. 40 mg of Lasix tomorrow and the next day. Make a follow up appointment with Dr. Delton Coombes this week. Return instructions given. Vital signs stable for discharge. Dr. Rubin Payor evaluated this patient and agrees with plan. Patient / Family / Caregiver informed of clinical course, understand medical decision-making process, and agree with plan.         Mora Bellman, PA-C 09/01/12 1106

## 2012-08-31 NOTE — ED Notes (Signed)
WUJ:[id number]<JY> Expected date:<BR> Expected time:<BR> Means of arrival:<BR> Comments:<BR> Triage-ShOB

## 2012-08-31 NOTE — ED Notes (Signed)
She c/o shortness of breath, plus ~40 lb. Wt. Gain over past two weeks. She also c/o polymyalgias, esp. Of bilat. Knees, both of which have been replaced.

## 2012-08-31 NOTE — ED Notes (Signed)
Patient transported to X-ray 

## 2012-09-03 NOTE — ED Provider Notes (Signed)
Medical screening examination/treatment/procedure(s) were performed by non-physician practitioner and as supervising physician I was immediately available for consultation/collaboration.  Quinntin Malter R. Beuna Bolding, MD 09/03/12 1553 

## 2012-09-05 ENCOUNTER — Telehealth: Payer: Self-pay | Admitting: Pulmonary Disease

## 2012-09-05 ENCOUNTER — Ambulatory Visit (INDEPENDENT_AMBULATORY_CARE_PROVIDER_SITE_OTHER): Payer: BC Managed Care – PPO | Admitting: Pulmonary Disease

## 2012-09-05 ENCOUNTER — Encounter: Payer: Self-pay | Admitting: Pulmonary Disease

## 2012-09-05 VITALS — BP 160/100 | HR 97 | Temp 98.9°F | Ht 62.0 in | Wt 354.8 lb

## 2012-09-05 DIAGNOSIS — R0609 Other forms of dyspnea: Secondary | ICD-10-CM | POA: Insufficient documentation

## 2012-09-05 DIAGNOSIS — R0989 Other specified symptoms and signs involving the circulatory and respiratory systems: Secondary | ICD-10-CM

## 2012-09-05 NOTE — Assessment & Plan Note (Addendum)
The patient has significant dyspnea on exertion that I suspect is multifactorial.  She is morbidly obese, is deconditioned and has chronic musculoskeletal debility, and finally clearly has volume overload with pulmonary vascular congestion on her recent chest x-ray.  She does not have an ongoing pulmonary issue as far as I can tell currently.  She is not wearing oxygen at bedtime, and I suspect that she does have significant overnight desaturation that is contributing to her fluid issue.  I would like to check overnight oximetry and restart on oxygen if she meets criteria.  She is obviously going to need further aggressive diuresis, as well as a cardiac evaluation, and I will leave that to her primary care physician.  She also needs to work aggressively on weight loss and some type of conditioning program.

## 2012-09-05 NOTE — Telephone Encounter (Signed)
Called and spoke with Alvis Lemmings and Selena Batten with [facility] and both stated that a concentrator and portable tanks would be delivered to patient today. Pt was told by [name] at [facility] that we needed to place a separate order for the concentrator and a carrier for the portable tanks. This is standard delivery set up.  [facility] has also arranged for ONO on RA to be done tonight.  Called and spoke with patient who was confused after speaking with [name]. Advised patient that she would get the above equipment ordered and that the ono was to see if she needed o2 at night. Advised patient that [name] was the receptionist and was unsure of how these orders were worded. Verified with Dawn that the patient would have the oxygen delivered tonight, portable tanks with a carrier, concentrator and back up tanks. [name] J Cobb

## 2012-09-05 NOTE — Telephone Encounter (Signed)
LMOM for [name] ([facility]) to return call.

## 2012-09-05 NOTE — Telephone Encounter (Signed)
Larita Fife returned call and can be reached @ [phone]. Leanora Ivanoff

## 2012-09-05 NOTE — Telephone Encounter (Signed)
Called and LM with answering service to return call.

## 2012-09-05 NOTE — Telephone Encounter (Signed)
Message to be routed to [facility]--[name] and I spoke with Rebekah [name] in regards to matter and Rebekah [name] states she will contact [name].

## 2012-09-05 NOTE — Patient Instructions (Addendum)
Will check oxygen level overnight to see if this is needed. Please contact you primary care physician to set up cardiac evaluation and also to work with them on your fluid balance. Work on aggressive weight loss. Will arrange followup with me once we get the results of your oxygen evaluation.

## 2012-09-05 NOTE — Progress Notes (Signed)
  Subjective:    Patient ID: Rebekah [name], female    DOB: 1955/11/14, 57 y.o.   MRN: [id number]  HPI The patient is a 57 year old female who been asked to see for dyspnea on exertion.  The patient was seen last in this office approximately 5 years ago and felt to have mild obesity hypoventilation syndrome.  She has had a sleep study in the past with negative results, and also has a distant history of alveolar pneumopathy that resolved.  The patient was being treated with nocturnal oxygen in the past, but discontinued this on her return.  She recently went to the emergency room with increasing shortness of breath, and felt to be volume overloaded.  She had a chest x-ray that shows cardiomegaly and pulmonary vascular congestion, and was diuresed before sending home.  She did not have a cardiac workup prior to being released.  Some reason, the patient was asked to followup with [name] whether that her primary care physician.  Of note, the patient's serum bicarbonate was only 30 in the emergency room.  The patient saw definite improvement in her breathing with diuresis, but continues to have shortness of breath with exertion but not at rest.  She admits to chronic debility with chronic joint pain which interferes with her ability to ambulate.  The patient states that her weight has been up and down 50 pounds, and is unsure whether she is neutral or heavier compared to a year ago.  She denies any cough or congestion.   Review of Systems  Constitutional: Negative for fever and unexpected weight change.  HENT: Negative for ear pain, nosebleeds, congestion, sore throat, rhinorrhea, sneezing, trouble swallowing, dental problem, postnasal drip and sinus pressure.        Allergies  Eyes: Negative for redness and itching.  Respiratory: Positive for cough ( green/yellow/clear mostly in AM), shortness of breath and wheezing. Negative for chest tightness.   Cardiovascular: Positive for leg swelling. Negative for  palpitations.  Gastrointestinal: Negative for nausea and vomiting.  Genitourinary: Negative for dysuria.  Musculoskeletal: Negative for joint swelling.  Skin: Negative for rash.  Neurological: Negative for headaches.  Hematological: Does not bruise/bleed easily.  Psychiatric/Behavioral: Negative for dysphoric mood. The patient is not nervous/anxious.        Objective:   Physical Exam Constitutional:  Morbidly obese female, no acute distress  HENT:  Nares patent without discharge  Oropharynx without exudate, palate and uvula are mildly elongated.   Eyes:  Perrla, eomi, no scleral icterus  Neck:  No JVD, no TMG  Cardiovascular:  Normal rate, regular rhythm, no rubs or gallops.  No murmurs        Decreased distal pulses.   Pulmonary :  Normal breath sounds, no stridor or respiratory distress   No rales, rhonchi, or wheezing  Abdominal:  Soft, nondistended, bowel sounds present.  No tenderness noted.   Musculoskeletal:  2+ lower extremity edema noted.  Lymph Nodes:  No cervical lymphadenopathy noted  Skin:  No cyanosis noted  Neurologic:  Alert, appropriate, moves all 4 extremities without obvious deficit.         Assessment & Plan:

## 2012-09-09 ENCOUNTER — Telehealth: Payer: Self-pay | Admitting: *Deleted

## 2012-09-09 NOTE — Telephone Encounter (Signed)
OV notes per [facility] have been faxed to Prudy Feeler, PA in [place] at [phone] Spoke with her office at [phone]--verified this was received.  Nothing further needed.

## 2012-09-10 ENCOUNTER — Telehealth: Payer: Self-pay | Admitting: *Deleted

## 2012-09-10 NOTE — Telephone Encounter (Signed)
ONO results have been received and placed in KC green folder for review. Please advise, thanks.   

## 2012-09-11 ENCOUNTER — Telehealth: Payer: Self-pay | Admitting: Pulmonary Disease

## 2012-09-11 NOTE — Telephone Encounter (Signed)
KC, is this okay to have the pt evaluated for portable o2 with [facility]? Please advise thanks!

## 2012-09-12 NOTE — Telephone Encounter (Signed)
Spoke with pt and notified of recs per [facility] She verbalized understanding and denied any questions

## 2012-09-12 NOTE — Telephone Encounter (Signed)
Please let pt know that her oxygen level falls extremely low to dangerous levels overnight while sleeping.  She should get back on her oxygen everynight while sleeping at 3 liters.

## 2012-09-12 NOTE — Telephone Encounter (Signed)
Ok with me 

## 2012-09-12 NOTE — Telephone Encounter (Signed)
Spoke with Selena Batten at [facility] and gave VO for this  Nothing further was needed

## 2012-09-18 ENCOUNTER — Other Ambulatory Visit: Payer: Self-pay | Admitting: Pulmonary Disease

## 2012-09-18 DIAGNOSIS — R0609 Other forms of dyspnea: Secondary | ICD-10-CM

## 2012-09-30 ENCOUNTER — Telehealth: Payer: Self-pay | Admitting: Pulmonary Disease

## 2012-09-30 DIAGNOSIS — R0609 Other forms of dyspnea: Secondary | ICD-10-CM

## 2012-09-30 NOTE — Telephone Encounter (Signed)
Order was sent to [facility] 

## 2012-09-30 NOTE — Telephone Encounter (Signed)
Pt is on 4 lpm continuous during the day and 3 lpm at night. Pt needs to be evaluated for Portable Oxygen Concentrator Device. Please send referral to [facility]. [name] J Cobb

## 2012-10-02 ENCOUNTER — Telehealth: Payer: Self-pay | Admitting: *Deleted

## 2012-10-02 NOTE — Telephone Encounter (Signed)
ONO results have been received and placed in KC green folder for review. Please advise, thanks.   

## 2012-10-13 ENCOUNTER — Other Ambulatory Visit: Payer: Self-pay | Admitting: Pulmonary Disease

## 2012-10-13 DIAGNOSIS — R0609 Other forms of dyspnea: Secondary | ICD-10-CM

## 2012-10-13 NOTE — Telephone Encounter (Signed)
These were already addressed the end of May.  The pt was instructed to stay on oxygen at HS.  See telephone note in record.

## 2012-10-23 ENCOUNTER — Telehealth: Payer: Self-pay | Admitting: Pulmonary Disease

## 2012-10-23 NOTE — Telephone Encounter (Signed)
ATC [facility] but they are closed. Need to call in AM

## 2012-10-24 NOTE — Telephone Encounter (Signed)
Spoke with [employer] @ [facility]-- Order received to for patient to have o2 conserving device at 3L Per Kim patients o2 sats went below 89% at this rate States o2 stayed up on 5L Requesting to change order to 5L Please advise Dr. Shelle Iron, thanks!  Last OV:  09/05/12 w f/u --Will arrange followup with me once we get the results of your oxygen evaluation >>> no scheduled at this time

## 2012-10-27 NOTE — Telephone Encounter (Signed)
I spoke with pt and notified of recs per [facility]  She in out of town until 7/28  [facility] had no appts, so I have scheduled her to see TP on 11/11/12 at 3 pm

## 2012-10-27 NOTE — Telephone Encounter (Signed)
Have to weigh her temporary sat drops against mobility and portability of her oxygen.  I would like pt to come in here with pulsed oxygen at 3 liters and let's do ambulatory oximetry.  Can titrate to 85% or higher when she comes in to do testing with [name]. Please discuss with me the day she comes in.

## 2012-11-10 ENCOUNTER — Other Ambulatory Visit: Payer: Self-pay | Admitting: *Deleted

## 2012-11-10 DIAGNOSIS — R600 Localized edema: Secondary | ICD-10-CM

## 2012-11-10 DIAGNOSIS — R011 Cardiac murmur, unspecified: Secondary | ICD-10-CM

## 2012-11-11 ENCOUNTER — Ambulatory Visit: Payer: BC Managed Care – PPO | Admitting: Adult Health

## 2012-11-14 ENCOUNTER — Ambulatory Visit (HOSPITAL_COMMUNITY): Payer: BC Managed Care – PPO

## 2012-11-14 ENCOUNTER — Encounter: Payer: Self-pay | Admitting: Cardiovascular Disease

## 2012-11-14 ENCOUNTER — Ambulatory Visit (HOSPITAL_COMMUNITY)
Admission: RE | Admit: 2012-11-14 | Discharge: 2012-11-14 | Disposition: A | Discharge status: Home / Self Care | Payer: BC Managed Care – PPO | Source: Ambulatory Visit | Attending: Cardiovascular Disease | Admitting: Cardiovascular Disease

## 2012-11-14 DIAGNOSIS — R0989 Other specified symptoms and signs involving the circulatory and respiratory systems: Secondary | ICD-10-CM | POA: Insufficient documentation

## 2012-11-14 DIAGNOSIS — R0609 Other forms of dyspnea: Secondary | ICD-10-CM | POA: Insufficient documentation

## 2012-11-14 DIAGNOSIS — I10 Essential (primary) hypertension: Secondary | ICD-10-CM | POA: Insufficient documentation

## 2012-11-14 DIAGNOSIS — R011 Cardiac murmur, unspecified: Secondary | ICD-10-CM | POA: Insufficient documentation

## 2012-11-14 HISTORY — PX: TRANSTHORACIC ECHOCARDIOGRAM: SHX275

## 2012-11-14 NOTE — Progress Notes (Signed)
[facility]   2D echo completed 11/14/2012.   Cindy Marwa Fuhrman, RDCS  

## 2012-11-18 ENCOUNTER — Other Ambulatory Visit: Payer: Self-pay | Admitting: *Deleted

## 2012-11-18 ENCOUNTER — Ambulatory Visit (HOSPITAL_COMMUNITY)
Admission: RE | Admit: 2012-11-18 | Discharge: 2012-11-18 | Disposition: A | Discharge status: Home / Self Care | Payer: BC Managed Care – PPO | Source: Ambulatory Visit | Attending: Cardiovascular Disease | Admitting: Cardiovascular Disease

## 2012-11-18 DIAGNOSIS — R600 Localized edema: Secondary | ICD-10-CM

## 2012-11-18 DIAGNOSIS — R0602 Shortness of breath: Secondary | ICD-10-CM

## 2012-11-18 DIAGNOSIS — R609 Edema, unspecified: Secondary | ICD-10-CM | POA: Insufficient documentation

## 2012-11-18 NOTE — Progress Notes (Signed)
Lower Ext. Venous Duplex Completed. Marilynne Halsted, BS, RDMS, RVT

## 2013-02-19 ENCOUNTER — Other Ambulatory Visit: Payer: Self-pay

## 2013-06-25 ENCOUNTER — Ambulatory Visit (INDEPENDENT_AMBULATORY_CARE_PROVIDER_SITE_OTHER): Payer: Self-pay | Admitting: General Surgery

## 2013-07-02 ENCOUNTER — Encounter (INDEPENDENT_AMBULATORY_CARE_PROVIDER_SITE_OTHER): Payer: Self-pay | Admitting: General Surgery

## 2013-07-02 ENCOUNTER — Ambulatory Visit (INDEPENDENT_AMBULATORY_CARE_PROVIDER_SITE_OTHER): Payer: BC Managed Care – PPO | Admitting: General Surgery

## 2013-07-02 ENCOUNTER — Other Ambulatory Visit (INDEPENDENT_AMBULATORY_CARE_PROVIDER_SITE_OTHER): Payer: Self-pay | Admitting: General Surgery

## 2013-07-02 VITALS — BP 136/86 | HR 96 | Temp 97.4°F | Resp 16 | Ht 61.0 in | Wt 372.2 lb

## 2013-07-02 DIAGNOSIS — Z6841 Body Mass Index (BMI) 40.0 and over, adult: Secondary | ICD-10-CM

## 2013-07-02 NOTE — Patient Instructions (Signed)
Congratulations on starting your journey to a healthier life! Over the next few weeks you will be undergoing tests (x-rays and labs) and seeing specialists to help evaluate you for weight loss surgery.  These tests and consultations with a psychologist and nutritionist are needed to prepare you for the lifestyle changes that lie ahead and are often required by insurance companies to approve you for surgery.   Pathway to Surgery:  Over the next few weeks -->Lab work -->Radiology tests   - Chest x-ray - make sure your lungs are normal before surgery  - Upper GI - you drink barium and pictures are taken as it travels down your  esophagus and into your stomach - looks for reflux and a hiatal hernia which may  need to repaired at the same time as your weight loss surgery  - Abdominal Ultrasound - looks at your gallbladder and liver  - Mammogram - up to date mammogram if you are a female -->EKG  -->Sleep study - if you are felt to be at high risk for obstructive sleep apnea -->H. Pylori breath test ([employer]) - you surgeon may order this test to see if you have  a bacteria (H pylori) in your stomach which makes you at higher risk to develop a  ulcer or inflammation of your stomach -->Nutrition consultation -->Psychologist consultation -->Other specialist consults - your surgeon may determine that you need to see a  specialist like a cardiologist or pulmnologist depending on your health history -->Watch EMMI video about your planned weight loss surgery -->Physical Therapy consult to evaluate you and help devise exercise regimen -->I would like for you to try to lose 50 pounds before surgery - Consider rejoining Weight [employer], using [employer]itness[employer]  Two weeks prior to surgery  Go on the extremely low carb liquid diet - this will decrease the size of your liver  which will make surgery safer  Attend preoperative appointment with your surgeon  Attend preoperative surgery class  One week prior to  surgery  No aspirin products.  Tylenol is acceptable   24 hours prior to surgery  No alcoholic beverages  Report fever greater than 100.5 or excessive nasal drainage suggesting infection  Continue bariatric preop diet  Perform bowel prep if ordered  Do not eat or drink anything after midnight the night before surgery  Do not take any medications except those instructed by the anesthesiologist  Morning of surgery  Please arrive at the hospital at least 2 hours before your scheduled surgery time.  No makeup, fingernail polish or jewelry  Bring insurance cards with you  Bring your CPAP mask if you use this

## 2013-07-02 NOTE — Progress Notes (Signed)
Patient ID: Rebekah [name], female   DOB: 1955/07/13, 58 y.o.   MRN: [id number]  Chief Complaint  Patient presents with  . Obesity    New bari -gastric sleeve    HPI Rebekah [name] is a 58 y.o. female.   HPI 58 year old morbidly obese Caucasian female was referred by Particia Nearing, PA for evaluation of weight loss surgery. 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 She used to work at [employer]. She states that she has struggled with her weight her entire life. Despite numerous attempts for sustained weight loss she has been unsuccessful. She has done Weight Watchers numerous times and has had mixed results.She is also done physician supervised weight loss at one point and lost up to 130 pounds but regained all the weight back. She has also tried [employer]. She also has used the [employer] smart app  She is most interested in a sleeve gastrectomy because it by report decrease his hunger and is least invasive compared to a gastric bypass. She states that she understands surgery is a tool. And that surgery will require lifestyle changes. She misses camping and swimming.  She is oxygen dependent at night as well as with some ambulation. She has severe musculoskeletal disease in her knees which limits her mobility. She takes Naprosyn for joint pain Past Medical History  Diagnosis Date  . Arthritis   . Hypertension   . GERD (gastroesophageal reflux disease)     Past Surgical History  Procedure Laterality Date  . Joint replacement    . Tonsillectomy    . Cholecystectomy    . Appendectomy    . Tubal ligation      Family History  Problem Relation Age of Onset  . Diabetes Father   . Diabetes Maternal Grandmother   . Uterine cancer Mother   . Cervical cancer Mother   . Breast cancer Mother   . Cancer Mother     cervical and brreast cancer    Social History History  Substance Use Topics  . Smoking status: Never Smoker     . Smokeless tobacco: Not on file  . Alcohol Use: No    Allergies  Allergen Reactions  . Mucinex [Guaifenesin Er] Anaphylaxis  . Hydromet [Hydrocodone-Homatropine]   . Propoxyphene Hcl     REACTION: hallucinate  . Sulfonamide Derivatives     REACTION: rash    Current Outpatient Prescriptions  Medication Sig Dispense Refill  . albuterol (PROVENTIL HFA;VENTOLIN HFA) 108 (90 BASE) MCG/ACT inhaler Inhale 2 puffs into the lungs every 6 (six) hours as needed for wheezing.      Marland Kitchen amLODipine (NORVASC) 10 MG tablet Take 10 mg by mouth daily.      . cetirizine (ZYRTEC) 10 MG tablet Take 10 mg by mouth daily.      Marland Kitchen EPINEPHrine (EPIPEN) 0.3 mg/0.3 mL DEVI Inject 0.3 mg into the muscle once.      . escitalopram (LEXAPRO) 20 MG tablet Take 20 mg by mouth daily.      . fenoprofen (NALFON) 600 MG TABS tablet Take 600 mg by mouth.      . Fluticasone-Salmeterol (ADVAIR HFA IN) Inhale 2 puffs into the lungs 2 (two) times daily.      . furosemide (LASIX) 40 MG tablet Take 40 mg by mouth daily.      . lansoprazole (PREVACID) 30 MG capsule Take 30 mg by mouth daily.      Marland Kitchen  Multiple Minerals-Vitamins (CALCIUM & VIT D3 BONE HEALTH PO) Take by mouth.      . multivitamin-iron-minerals-folic acid (CENTRUM) chewable tablet Chew 1 tablet by mouth daily.      . naproxen (NAPROSYN) 500 MG tablet Take 500 mg by mouth at bedtime.       . potassium chloride SA (K-DUR,KLOR-CON) 20 MEQ tablet Take 20 mEq by mouth 2 (two) times daily.      . traMADol (ULTRAM) 50 MG tablet Take 50 mg by mouth every 6 (six) hours as needed for pain.      . Trospium Chloride 60 MG CP24       . valsartan (DIOVAN) 320 MG tablet Take 320 mg by mouth daily.       No current facility-administered medications for this visit.    Review of Systems Review of Systems  Constitutional: Negative for fever, activity change, appetite change and unexpected weight change.  HENT: Negative for nosebleeds and trouble swallowing.   Eyes: Negative for  photophobia and visual disturbance.  Respiratory: Negative for chest tightness and shortness of breath.        Uses 3 L of oxygen at night and sometimes with ambulation; prior sleep studies demonstrated no sleep disturbance  Cardiovascular: Negative for chest pain and leg swelling.       Denies CP, SOB, orthopnea, + PND, +DOE; states she cannot lay flat. States can't breathe if she lays flat. Sleeps in a recliner. Does have some lower extremity edema  Gastrointestinal: Negative for nausea, vomiting, abdominal pain, diarrhea and constipation.       Positive reflux. A bowel movement every day. Prior cholecystectomy and appendectomy. No history of upper endoscopy  Genitourinary: Negative for dysuria and difficulty urinating.       G2 P2  Musculoskeletal: Negative for arthralgias.       Bilateral shoulder pain, has osteoarthritis of fingers. Prior left knee replacement. Needs right knee replacement but was told she had to loose weight. Has seen Dr. Alvan Dame  Skin: Negative for pallor and rash.  Neurological: Negative for dizziness, seizures, facial asymmetry and numbness.       Denies TIA and amaurosis fugax   Hematological: Negative for adenopathy. Does not bruise/bleed easily.  Psychiatric/Behavioral: Negative for behavioral problems and agitation.    Blood pressure 136/86, pulse 96, temperature 97.4 F (36.3 C), temperature source Temporal, resp. rate 16, height 5\' 1"  (1.549 m), weight 372 lb 3.2 oz (168.829 kg).  Physical Exam Physical Exam  Vitals reviewed. Constitutional: She is oriented to person, place, and time. She appears well-developed and well-nourished. No distress.  Pear-shaped; Prior mammograms at [facility]; walks independently while in the house. At stores, Uses a scooter because of severe knee pain  HENT:  Head: Normocephalic and atraumatic.  Right Ear: External ear normal.  Left Ear: External ear normal.  Eyes: Conjunctivae are normal. No scleral icterus.  Neck:  Normal range of motion. Neck supple. No tracheal deviation present. No thyromegaly present.  Cardiovascular: Normal rate and normal heart sounds.   Pulmonary/Chest: Effort normal and breath sounds normal. No stridor. No respiratory distress. She has no wheezes.  Abdominal: Soft. She exhibits no distension. There is no tenderness. There is no rebound and no guarding.    Large abdomen, small fascial defect at the umbilicus about half a centimeter  Musculoskeletal: She exhibits no edema and no tenderness.  On both hands, some joint abnormalities involving several fingers consistent with arthritis  Lymphadenopathy:    She has no cervical adenopathy.  Neurological: She is alert and oriented to person, place, and time. She exhibits normal muscle tone.  Skin: Skin is warm and dry. No rash noted. She is not diaphoretic. No erythema.     Brawny lower extremity skin changes, trace edema bilateral lower extremity  Psychiatric: She has a normal mood and affect. Her behavior is normal. Judgment and thought content normal.    Data Reviewed Dr Gwenette Greet office note 09/05/12 - DOE, obesity hypoventilation syndrome Sleep study 2008- no obvious sleep disturbance, no REM LE duplex 8/14 - negative but limited to obesity Echo 8/14 - mod LVH, EF 60%, somewhat limited due to obesity, Dr Rollene Fare  Assessment    Morbid obesity BMI 70 Obesity hypoventilation syndrome Bilateral knee degenerative joint disease Gastroesophageal reflux disease Hypertension     Plan    The patient meets weight loss surgery criteria. I think the patient would be an acceptable candidate for Laparoscopic vertical sleeve gastrectomy. However at her current BMI of 70 surgery could be technically challenging and some of her perioperative risk factors would be increased at her current weight-we discussed this.  We discussed laparoscopic sleeve gastrectomy. We discussed the preoperative, operative and postoperative process. Using  diagrams, I explained the surgery in detail including the performance of an EGD near the end of the surgery and an Upper GI swallow study on POD 1. We discussed the typical hospital course including a 2-3 day stay baring any complications.   The patient was given educational material. I quoted the patient that most patients can lose up to 50-70% of their excess weight. We did discuss the possibility of weight regain several years after the procedure.  The risks of infection, bleeding, pain, scarring, weight regain, too little or too much weight loss, vitamin deficiencies and need for lifelong vitamin supplementation, hair loss, need for protein supplementation, leaks, stricture, reflux, food intolerance, gallstone formation, hernia, need for reoperation and conversion to roux Y gastric bypass, need for open surgery, injury to spleen or surrounding structures, DVT's, PE, and death again discussed with the patient and the patient expressed understanding and desires to proceed with laparoscopic vertical sleeve gastrectomy, possible open, intraoperative endoscopy.  We discussed that before and after surgery that there would be an alteration in their diet. I explained that we have put them on a diet 2 weeks before surgery. I also explained that they would be on a liquid diet for 2 weeks after surgery. We discussed that they would have to avoid certain foods after surgery. We discussed the importance of physical activity as well as compliance with our dietary and supplement recommendations and routine follow-up.  I explained to the patient that we will start our evaluation process which includes labs, Upper GI to evaluate stomach and swallowing anatomy, nutritionist consultation, psychiatrist consultation, EKG, CXR, abdominal ultrasound, Up to date mammogram, cardiac clearance, and physical therapy evaluation  During the evaluation. I've given the patient a goal of 50 pounds to lose.  We discussed her regarding  Weight Watchers or using her Smart app on her phone to help her. She was also given access to watch the EMMI video on sleeve gastrectomy.  Leighton Ruff. Redmond Pulling, MD, FACS General, Bariatric, & Minimally Invasive Surgery [facility] Surgery, [place]          [facility] M 07/02/2013, 6:08 PM

## 2013-07-03 LAB — LIPID PANEL
CHOL/HDL RATIO: 4.2 ratio
Cholesterol: 223 mg/dL — ABNORMAL HIGH (ref 0–200)
HDL: 53 mg/dL (ref >39)
LDL CALC: 127 mg/dL — AB (ref 0–99)
Triglycerides: 214 mg/dL — ABNORMAL HIGH (ref <150)
VLDL: 43 mg/dL — AB (ref 0–40)

## 2013-07-03 LAB — CBC
HCT: 38.7 % (ref 36.0–46.0)
Hemoglobin: 11.9 g/dL — ABNORMAL LOW (ref 12.0–15.0)
MCH: 25.1 pg — ABNORMAL LOW (ref 26.0–34.0)
MCHC: 30.7 g/dL (ref 30.0–36.0)
MCV: 81.5 fL (ref 78.0–100.0)
PLATELETS: 221 10*3/uL (ref 150–400)
RBC: 4.75 MIL/uL (ref 3.87–5.11)
RDW: 15.8 % — AB (ref 11.5–15.5)
WBC: 8.9 10*3/uL (ref 4.0–10.5)

## 2013-07-03 LAB — TSH: TSH: 5.902 u[IU]/mL — ABNORMAL HIGH (ref 0.350–4.500)

## 2013-07-03 LAB — H. PYLORI ANTIBODY, IGG: H Pylori IgG: 0.53 {ISR}

## 2013-07-03 LAB — HEMOGLOBIN A1C
Hgb A1c MFr Bld: 6.1 % — ABNORMAL HIGH (ref <5.7)
MEAN PLASMA GLUCOSE: 128 mg/dL — AB (ref <117)

## 2013-07-06 ENCOUNTER — Other Ambulatory Visit (INDEPENDENT_AMBULATORY_CARE_PROVIDER_SITE_OTHER): Payer: Self-pay | Admitting: General Surgery

## 2013-07-15 ENCOUNTER — Encounter: Payer: Self-pay | Admitting: *Deleted

## 2013-07-21 ENCOUNTER — Other Ambulatory Visit (INDEPENDENT_AMBULATORY_CARE_PROVIDER_SITE_OTHER): Payer: Self-pay | Admitting: General Surgery

## 2013-07-21 DIAGNOSIS — Z1231 Encounter for screening mammogram for malignant neoplasm of breast: Secondary | ICD-10-CM

## 2013-07-23 ENCOUNTER — Ambulatory Visit: Payer: BC Managed Care – PPO | Admitting: Internal Medicine

## 2013-07-24 ENCOUNTER — Other Ambulatory Visit (HOSPITAL_COMMUNITY): Payer: BC Managed Care – PPO

## 2013-07-24 ENCOUNTER — Ambulatory Visit (HOSPITAL_COMMUNITY): Payer: BC Managed Care – PPO

## 2013-07-29 ENCOUNTER — Inpatient Hospital Stay (HOSPITAL_COMMUNITY): Admission: RE | Admit: 2013-07-29 | Payer: BC Managed Care – PPO | Source: Ambulatory Visit

## 2013-07-29 ENCOUNTER — Other Ambulatory Visit (HOSPITAL_COMMUNITY): Payer: BC Managed Care – PPO

## 2013-08-11 ENCOUNTER — Ambulatory Visit
Admission: RE | Admit: 2013-08-11 | Discharge: 2013-08-11 | Disposition: A | Discharge status: Home / Self Care | Payer: BC Managed Care – PPO | Source: Ambulatory Visit | Attending: General Surgery | Admitting: General Surgery

## 2013-08-11 ENCOUNTER — Other Ambulatory Visit (INDEPENDENT_AMBULATORY_CARE_PROVIDER_SITE_OTHER): Payer: Self-pay | Admitting: General Surgery

## 2013-08-11 DIAGNOSIS — Z1231 Encounter for screening mammogram for malignant neoplasm of breast: Secondary | ICD-10-CM

## 2013-08-11 DIAGNOSIS — Z803 Family history of malignant neoplasm of breast: Secondary | ICD-10-CM

## 2013-08-12 ENCOUNTER — Ambulatory Visit (HOSPITAL_COMMUNITY)
Admission: RE | Admit: 2013-08-12 | Discharge: 2013-08-12 | Disposition: A | Discharge status: Home / Self Care | Payer: BC Managed Care – PPO | Source: Ambulatory Visit | Attending: General Surgery | Admitting: General Surgery

## 2013-08-12 ENCOUNTER — Other Ambulatory Visit: Payer: Self-pay

## 2013-08-12 ENCOUNTER — Ambulatory Visit (HOSPITAL_COMMUNITY): Payer: BC Managed Care – PPO

## 2013-08-12 DIAGNOSIS — Z6841 Body Mass Index (BMI) 40.0 and over, adult: Secondary | ICD-10-CM | POA: Insufficient documentation

## 2013-08-12 DIAGNOSIS — M1710 Unilateral primary osteoarthritis, unspecified knee: Secondary | ICD-10-CM | POA: Insufficient documentation

## 2013-08-12 DIAGNOSIS — M129 Arthropathy, unspecified: Secondary | ICD-10-CM | POA: Insufficient documentation

## 2013-08-12 DIAGNOSIS — K219 Gastro-esophageal reflux disease without esophagitis: Secondary | ICD-10-CM | POA: Insufficient documentation

## 2013-08-12 DIAGNOSIS — E662 Morbid (severe) obesity with alveolar hypoventilation: Secondary | ICD-10-CM | POA: Insufficient documentation

## 2013-08-12 DIAGNOSIS — Z9089 Acquired absence of other organs: Secondary | ICD-10-CM | POA: Insufficient documentation

## 2013-08-12 DIAGNOSIS — I10 Essential (primary) hypertension: Secondary | ICD-10-CM | POA: Insufficient documentation

## 2013-08-19 ENCOUNTER — Encounter: Payer: Self-pay | Admitting: Dietician

## 2013-08-19 ENCOUNTER — Encounter: Payer: BC Managed Care – PPO | Attending: General Surgery | Admitting: Dietician

## 2013-08-19 VITALS — Ht 61.0 in | Wt 367.1 lb

## 2013-08-19 DIAGNOSIS — Z6841 Body Mass Index (BMI) 40.0 and over, adult: Secondary | ICD-10-CM

## 2013-08-19 DIAGNOSIS — Z01818 Encounter for other preprocedural examination: Secondary | ICD-10-CM | POA: Insufficient documentation

## 2013-08-19 DIAGNOSIS — Z713 Dietary counseling and surveillance: Secondary | ICD-10-CM | POA: Insufficient documentation

## 2013-08-19 NOTE — Progress Notes (Signed)
  Pre-Op Assessment Visit:  Pre-Operative Gastric sleeve Surgery  Medical Nutrition Therapy:  Appt start time: 7253   End time:  6644.  Patient was seen on 08/19/2013 for Pre-Operative Gastric sleeve Nutrition Assessment. Assessment and letter of approval faxed to [facility] Bariatric Surgery Program coordinator on 08/19/2013.   Preferred Learning Style:  No preference indicated   Learning Readiness:  Ready  Handouts given during visit include:  Pre-Op Goals Bariatric Surgery Protein Shakes  Teaching Method Utilized:  Visual Auditory  Barriers to learning/adherence to lifestyle change: physical limitations  Demonstrated degree of understanding via:  Teach Back   Patient to call the [facility] to enroll in Pre-Op and Post-Op Nutrition Education when surgery date is scheduled.

## 2013-08-21 ENCOUNTER — Ambulatory Visit: Payer: BC Managed Care – PPO | Admitting: Internal Medicine

## 2013-09-16 ENCOUNTER — Ambulatory Visit (INDEPENDENT_AMBULATORY_CARE_PROVIDER_SITE_OTHER): Payer: BC Managed Care – PPO | Admitting: Internal Medicine

## 2013-09-16 ENCOUNTER — Encounter: Payer: Self-pay | Admitting: Cardiovascular Disease

## 2013-09-16 ENCOUNTER — Encounter: Payer: Self-pay | Admitting: Internal Medicine

## 2013-09-16 VITALS — BP 142/86 | HR 86 | Ht 61.0 in | Wt 363.5 lb

## 2013-09-16 DIAGNOSIS — R0902 Hypoxemia: Secondary | ICD-10-CM

## 2013-09-16 DIAGNOSIS — R0989 Other specified symptoms and signs involving the circulatory and respiratory systems: Secondary | ICD-10-CM

## 2013-09-16 DIAGNOSIS — Z6841 Body Mass Index (BMI) 40.0 and over, adult: Secondary | ICD-10-CM

## 2013-09-16 DIAGNOSIS — I10 Essential (primary) hypertension: Secondary | ICD-10-CM

## 2013-09-16 DIAGNOSIS — R0609 Other forms of dyspnea: Secondary | ICD-10-CM

## 2013-09-16 NOTE — Patient Instructions (Signed)
Your physician wants you to follow-up in:  6 months. You will receive a reminder letter in the mail two months in advance. If you don't receive a letter, please call our office to schedule the follow-up appointment.   

## 2013-09-16 NOTE — Progress Notes (Signed)
OFFICE NOTE  Chief Complaint:  Cardiac clearance for surgery, establish new cardiologist  Primary Care Physician: Terald Sleeper, PA-C  HPI:  Rebekah [name] is a pleasant 58 year old female with a past medical history significant for her super morbid obesity, chronic hypoxia, hypertension, GERD and prior knee replacement. She was previously followed by Dr. Rollene Fare and was being evaluated for bariatric surgery. She is here to establish new care with me today and for preoperative evaluation. 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 She appears he seen Dr. Rollene Fare will order an echocardiogram on August of 2014 which showed moderate thickening of the left ventricle with EF of 60-65%, mildly increased RV systolic pressure and no significant valvular disease. There was left atrial enlargement. She denies any chest pain but does have expected shortness of breath with exertion. Lower extremity venous Dopplers were performed which show no evidence of significant venous insufficiency.     PMHx:  Past Medical History  Diagnosis Date  . Arthritis   . Hypertension   . GERD (gastroesophageal reflux disease)   . Obesity     Past Surgical History  Procedure Laterality Date  . Joint replacement    . Tonsillectomy    . Cholecystectomy    . Appendectomy    . Tubal ligation    . Transthoracic echocardiogram  11/2012    EF 60-65%, mod LVH & mod conc hypertrophy, grade 1 diastolic dysfunction; LA mildly dilated; RV systolic pressure increased; PA peak pressure 58mmHg    FAMHx:  Family History  Problem Relation Age of Onset  . Diabetes Father   . Diabetes Maternal Grandmother   . Uterine cancer Mother   . Cervical cancer Mother   . Breast cancer Mother   . Cancer Mother     cervical and brreast cancer    SOCHx:   reports that she has never  smoked. She does not have any smokeless tobacco history on file. She reports that she does not drink alcohol or use illicit drugs.  ALLERGIES:  Allergies  Allergen Reactions  . Mucinex [Guaifenesin Er] Anaphylaxis  . Hydromet [Hydrocodone-Homatropine]   . Propoxyphene Hcl     REACTION: hallucinate  . Sulfonamide Derivatives     REACTION: rash    ROS: A comprehensive review of systems was negative except for: Constitutional: positive for fatigue Respiratory: positive for dyspnea on exertion  HOME MEDS: Current Outpatient Prescriptions  Medication Sig Dispense Refill  . albuterol (PROVENTIL HFA;VENTOLIN HFA) 108 (90 BASE) MCG/ACT inhaler Inhale 2 puffs into the lungs every 6 (six) hours as needed for wheezing.      Marland Kitchen amLODipine (NORVASC) 10 MG tablet Take 10 mg by mouth daily.      . cetirizine (ZYRTEC) 10 MG tablet Take 10 mg by mouth daily.      Marland Kitchen EPINEPHrine (EPIPEN) 0.3 mg/0.3 mL DEVI Inject 0.3 mg into the muscle once.      . escitalopram (LEXAPRO) 20 MG tablet Take 20 mg by mouth daily.      . Fluticasone-Salmeterol (ADVAIR HFA IN) Inhale 2 puffs into the lungs 2 (two) times daily as needed.       . furosemide (LASIX) 40 MG tablet Take 40 mg by mouth daily.      . lansoprazole (PREVACID) 30 MG capsule Take 30 mg by mouth daily.      Marland Kitchen levofloxacin (  LEVAQUIN) 500 MG tablet Take 1 tablet by mouth daily. For 14 days (started 6/1)      . Multiple Minerals-Vitamins (CALCIUM & VIT D3 BONE HEALTH PO) Take by mouth.      . multivitamin-iron-minerals-folic acid (CENTRUM) chewable tablet Chew 1 tablet by mouth daily.      . naproxen (NAPROSYN) 500 MG tablet Take 500 mg by mouth at bedtime.       . potassium chloride SA (K-DUR,KLOR-CON) 20 MEQ tablet Take 20 mEq by mouth 2 (two) times daily.      . traMADol (ULTRAM) 50 MG tablet Take 50 mg by mouth every 6 (six) hours as needed for pain.      . Trospium Chloride 60 MG CP24 Take 1 capsule by mouth daily.       . valsartan (DIOVAN) 320 MG  tablet Take 320 mg by mouth daily.       No current facility-administered medications for this visit.    LABS/IMAGING: No results found for this or any previous visit (from the past 48 hour(s)). No results found.  VITALS: BP 142/86  Pulse 86  Ht 5\' 1"  (1.549 m)  Wt 363 lb 8 oz (164.883 kg)  BMI 68.72 kg/m2  EXAM: General appearance: alert, no distress and morbidly obese Neck: no carotid bruit and no JVD Lungs: diminished breath sounds bilaterally Heart: regular rate and rhythm, S1, S2 normal, no murmur, click, rub or gallop Abdomen: morbidly obese Extremities: extremities normal, atraumatic, no cyanosis or edema Pulses: 2+ and symmetric Skin: Skin color, texture, turgor normal. No rashes or lesions Neurologic: Grossly normal Psych: Mood, affect normal  EKG:  normal sinus rhythm at 86  ASSESSMENT: 1. Low risk for upcoming bariatric surgery 2. Super morbid obesity 3. Hypoxia secondary to #2 4. Hypertension  PLAN: 1.    At this point Mrs. Gunderman should undergo surgery as it would be life saving for her.  I feel she is at low risk. She recognizes this and accepts that there are risks with surgery, but further perioperative testing is not necessary at this time. I will be available as needed perioperatively but will plan to followup with her in 6 months.  Pixie Casino, MD, [facility] Attending Cardiologist [place] 09/16/2013, 9:40 AM

## 2013-09-23 ENCOUNTER — Encounter (INDEPENDENT_AMBULATORY_CARE_PROVIDER_SITE_OTHER): Payer: Self-pay

## 2013-09-29 ENCOUNTER — Telehealth (INDEPENDENT_AMBULATORY_CARE_PROVIDER_SITE_OTHER): Payer: Self-pay | Admitting: General Surgery

## 2013-09-29 NOTE — Telephone Encounter (Signed)
Patient called in and stated that someone from our office call her PCP. Rebekah [name].  And she stated that they had to put her on synthroid 50 mcg. I told the patient that I did not see any phone message that anyone called from here. I told Mrs. Rebekah [name] that will need the lab work that she just had done fax to me so I can show it to Dr Redmond Pulling, I did tell her that I have cardiac clearance on her and I also asked her about her weight and she stated once she left the office that day the she has not been doing good on her weight, that she has been bed bound and she has a hard time with her weight, she ask does she still need to come in to see Dr Redmond Pulling, and I told her that I will send Dr. Redmond Pulling an message to see if he thinks that she needs to keep her 10-28-13 with [name]. And I told the patient that I will call her anyway, if she coming in or not. The patient understood

## 2013-09-29 NOTE — Telephone Encounter (Signed)
i'm not sure what happened. I forwarded her labs I believe to her PCP (if I remember correctly) because her screening bariatric labs revealed some hypothyroidism (elevated TSH). I felt her PCP should know this and would leave it up to them to determine if she needed to be on any thyroid medication. Yes i would like to see her as scheduled. She will need to lose some weight before I consider submitting her packet to insurance for surgery approval.

## 2013-09-29 NOTE — Telephone Encounter (Signed)
Patient call back and she talked to her PCP and Angle Jones stated to the patient that she will need to be on the synthroid med's for 4 weeks , she is getting her lab work done on 10-27-2013. As I was talking to the patient I saw Dr Redmond Pulling message back to me about that she still needs to keep her apt on the 10-28-13. And I read the whole message to the patient and once again she bought up about her lose weight, that it was hard, and she stated that she can not do a lot do having cellulitis. I told the patient that she has to take to lose the weight before Dr Redmond Pulling will consider submitting her packet to the insurance for surgery approval. She stated that she will try.

## 2013-10-28 ENCOUNTER — Other Ambulatory Visit (INDEPENDENT_AMBULATORY_CARE_PROVIDER_SITE_OTHER): Payer: Self-pay | Admitting: General Surgery

## 2013-10-28 ENCOUNTER — Encounter (INDEPENDENT_AMBULATORY_CARE_PROVIDER_SITE_OTHER): Payer: Self-pay | Admitting: General Surgery

## 2013-10-28 ENCOUNTER — Ambulatory Visit (INDEPENDENT_AMBULATORY_CARE_PROVIDER_SITE_OTHER): Payer: BC Managed Care – PPO | Admitting: General Surgery

## 2013-10-28 DIAGNOSIS — R1900 Intra-abdominal and pelvic swelling, mass and lump, unspecified site: Secondary | ICD-10-CM

## 2013-10-28 DIAGNOSIS — Z6841 Body Mass Index (BMI) 40.0 and over, adult: Secondary | ICD-10-CM

## 2013-10-28 NOTE — Patient Instructions (Signed)
Keep up the good work Eliminate sweet tea Continue to try to limit carbs Try parking further away from store entrances

## 2013-10-29 NOTE — Progress Notes (Signed)
Subjective:     Patient ID: Rebekah [name], female   DOB: 07-23-1955, 58 y.o.   MRN: [id number]  HPI 58 year old morbidly obese Caucasian female comes in for followup. I initially met her on March 19 for evaluation for laparoscopic Roux-en-Y gastric bypass surgery. Her weight at that time was 372 pounds. Her BMI was greater than 70. Based on her extreme obesity I asked her to try to lose some weight prior to [name] proceeding with surgery. She comes back in followup. She states that overall she has been doing well however she has had some issues develop since I last saw her. She developed some transient cellulitis of her left foot that resulted in some peeling of the skin however she states the cellulitis has resolved. She also developed some right shoulder rotator cuff problems and is having some difficulty lifting her right arm above her head. Otherwise she denies any chest pain or shortness of breath. She was evaluated by Dr. Debara Pickett in cardiology and was found to be a low risk for bariatric surgery. She is also completed the majority of her workup and evaluation for bariatric surgery. She was found to be hypothyroid on her initial bariatric labs. She was placed on low-dose Synthroid. Her primary care physician's office is monitoring that. She has been able to lose some weight and by monitoring her food choices. She is eating more fruits and vegetables. She is also doing some protein shakes as well. She is still drinking diet caffeine soda and some sweet tea occasionally.  PMHx, PSHx, SOCHx, FAMHx, ALL reviewed and unchanged   Review of Systems A 10 point Review of systems was performed and all systems are negative except for what is mentioned in the history of present illness     Objective:   Physical Exam  Vitals reviewed. Constitutional: She is oriented to person, place, and time. She appears well-developed and well-nourished. No distress.  Morbidly obese  HENT:  Head: Normocephalic and atraumatic.   Right Ear: External ear normal.  Left Ear: External ear normal.  Eyes: Conjunctivae are normal. No scleral icterus.  Cardiovascular: Normal rate.   Pulmonary/Chest: Effort normal. No stridor. No respiratory distress.  Musculoskeletal: She exhibits no edema and no tenderness.  L foot - no edema, skin intact. No cellulitis. Some dry scaly skin; some ROM limitations with R shoulder  Neurological: She is alert and oriented to person, place, and time.  Skin: Skin is warm and dry. No rash noted. She is not diaphoretic.  Psychiatric: She has a normal mood and affect. Her behavior is normal. Judgment and thought content normal.  Blood pressure 144/82, pulse 96, temperature 98.6 F (37 C), resp. rate 16, height 5' 1"  (1.549 m), weight 356 lb (161.481 kg).      Assessment:     Morbid obesity BMI 67 Mild hypothyroidism  Obesity hypoventilation syndrome  Bilateral knee degenerative joint disease  Gastroesophageal reflux disease  Hypertension      Plan:     We reviewed her workup today. Her upper GI was within normal limits except for a finding of a 1-1/2 cm calcified mass in her left pelvis. I have recommended a formal pelvic ultrasound to further look at this. Her labs for the most part were normal however a comprehensive metabolic panel was not ordered so we will repeat that. She has been able to lose some weight. Therefore I told her we can proceed with her evaluation and submission of her package insurance. In the interim I've encouraged to  continue to work on weight loss. She'll be scheduled for pelvic ultrasound  Leighton Ruff. Redmond Pulling, MD, FACS General, Bariatric, & Minimally Invasive Surgery [facility], [place]

## 2013-10-30 ENCOUNTER — Ambulatory Visit (HOSPITAL_COMMUNITY): Payer: BC Managed Care – PPO

## 2013-11-04 ENCOUNTER — Ambulatory Visit (HOSPITAL_COMMUNITY)
Admission: RE | Admit: 2013-11-04 | Discharge: 2013-11-04 | Disposition: A | Discharge status: Home / Self Care | Payer: BC Managed Care – PPO | Source: Ambulatory Visit | Attending: General Surgery | Admitting: General Surgery

## 2013-11-04 ENCOUNTER — Encounter (INDEPENDENT_AMBULATORY_CARE_PROVIDER_SITE_OTHER): Payer: Self-pay | Admitting: General Surgery

## 2013-11-04 DIAGNOSIS — R1900 Intra-abdominal and pelvic swelling, mass and lump, unspecified site: Secondary | ICD-10-CM

## 2013-11-04 MED ORDER — SODIUM CHLORIDE 0.9 % IJ SOLN
INTRAMUSCULAR | Status: AC
  Filled 2013-11-04: qty 33

## 2013-11-06 ENCOUNTER — Encounter (INDEPENDENT_AMBULATORY_CARE_PROVIDER_SITE_OTHER): Payer: Self-pay

## 2013-11-23 ENCOUNTER — Ambulatory Visit: Payer: BC Managed Care – PPO

## 2013-11-23 ENCOUNTER — Other Ambulatory Visit (INDEPENDENT_AMBULATORY_CARE_PROVIDER_SITE_OTHER): Payer: Self-pay | Admitting: General Surgery

## 2013-11-24 ENCOUNTER — Encounter (INDEPENDENT_AMBULATORY_CARE_PROVIDER_SITE_OTHER): Payer: Self-pay

## 2013-11-30 ENCOUNTER — Encounter: Payer: BC Managed Care – PPO | Attending: General Surgery

## 2013-11-30 DIAGNOSIS — Z6841 Body Mass Index (BMI) 40.0 and over, adult: Secondary | ICD-10-CM | POA: Insufficient documentation

## 2013-11-30 DIAGNOSIS — Z713 Dietary counseling and surveillance: Secondary | ICD-10-CM | POA: Insufficient documentation

## 2013-11-30 NOTE — Progress Notes (Signed)
  Pre-Operative Nutrition Class:  Appt start time: 830   End time:  930.  Patient was seen on 11/30/2013 for Pre-Operative Bariatric Surgery Education at the [facility].   Surgery date: 01/04/14 Surgery type: RYGB Start weight at [facility]: 367 lbs on 08/19/13 Weight today: 357.5 lbs  TANITA  BODY COMP RESULTS  11/30/13   BMI (kg/m^2) 67.5   Fat Mass (lbs) 196   Fat Free Mass (lbs) 161.5   Total Body Water (lbs) 118   Samples given per MNT protocol. Patient educated on appropriate usage: Premier protein shake (strawberry - qty 1) Lot #: [id number] Exp: 08/2014  Bariatric Advantage Calcium Citrate (wild cherry - qty 1) Lot #: [id number] Exp: 01/2014  Celebrate Vitamins Multivitamin (pineapple strawberry - qty 1) Lot #: [id number] Exp: 07/2014  Renee Pain Protein Powder (chocolate - qty 1) Lot #: [id number] Exp: 09/2014  The following the learning objectives were met by the patient during this course:  Identify Pre-Op Dietary Goals and will begin 2 weeks pre-operatively  Identify appropriate sources of fluids and proteins   State protein recommendations and appropriate sources pre and post-operatively  Identify Post-Operative Dietary Goals and will follow for 2 weeks post-operatively  Identify appropriate multivitamin and calcium sources  Describe the need for physical activity post-operatively and will follow MD recommendations  State when to call healthcare provider regarding medication questions or post-operative complications  Handouts given during class include:  Pre-Op Bariatric Surgery Diet Handout  Protein Shake Handout  Post-Op Bariatric Surgery Nutrition Handout  BELT Program Information Flyer  Support Group Information Flyer  [facility] Bariatric Supplements Price List  Follow-Up Plan: Patient will follow-up at [facility] 2 weeks post operatively for diet advancement per MD.

## 2013-12-14 ENCOUNTER — Telehealth: Payer: Self-pay | Admitting: Dietician

## 2013-12-14 NOTE — Telephone Encounter (Signed)
Email from pt on 12/05/13  Hello Magda Paganini and thanks for your time!  Your class was loaded with information that I am still trying to digest (oxymoron, huh?).    Does 1-whole egg + 1-egg white = a 3oz protein serving or a 1oz protein serving?  I would make a pre-op meal of salad greens, 2oz grilled chicken, egg and vegetable  OR   have eggs with cheese for breakfast a couple times/week.  Would eggs be 1 egg +1 white  or  2eggs +2 egg whites?  I'm an eggs for breakfast eater.       Can we use any of the [employer] products pre/post-op since 0 calories, 0 fat, 0 carbs, 0 sugar, 0 zero anything except flavors?  Don't know how they do that but they do.  Can be found out at all [employer] around here and [place].   Thank you!   Rebekah [name]   My reply on 12/08/13  Hi [name],   I'm sorry for the delay in my response. 1 whole egg + an egg white would probably be more like 2 ounces. You could add another egg white if you want to equal 3 ounces. Either of the examples you have provided sound great!   Feel free to use any of the [employer] products pre and post op! It truly is amazing that they can do that.   Magda Paganini  Patient reply:  Thank you! I wish I had thought of these 2 questions as well: 1- Do you count tomato as a fruit? I love tomatoes! I will be boohoo-ing for sure if I cant!  2- Maybe this is a stupid question with salad greens but...we are allowed to stuff a cup or eyeball a nice bowl of greens, correct? There's a huge difference between 3 leaves & shredded salad greens.   Sorry for the questions! I had 1 more but can't remember it. Don't worry, I won't keep asking but doing this for a month gives room for creative thinking.  Happy week! Rebekah [name]  My reply:  I think of tomatoes at a vegetable.  Also, you are fine to have a nice big bowl of leafy greens!  Magda Paganini

## 2013-12-15 ENCOUNTER — Ambulatory Visit (INDEPENDENT_AMBULATORY_CARE_PROVIDER_SITE_OTHER): Payer: BC Managed Care – PPO | Admitting: General Surgery

## 2013-12-22 ENCOUNTER — Ambulatory Visit: Payer: BC Managed Care – PPO

## 2013-12-22 ENCOUNTER — Encounter (HOSPITAL_COMMUNITY): Payer: Self-pay | Admitting: Pharmacy Technician

## 2013-12-24 NOTE — Progress Notes (Signed)
Talked to patient via phone making pre-op appointment for surgery and patient states she is NOT having a Gastric Bypass but having surgery for a Gastric Sleeve. Please clarify this with Dr. Redmond Pulling and surgery scheduling and OR consent states she is having a Roux-en-Y!. Please take care of this for her!

## 2013-12-29 NOTE — Patient Instructions (Addendum)
ANTHONELLA KLAUSNER  12/29/2013                           YOUR PROCEDURE IS SCHEDULED ON: 01/04/14               ENTER THRU [facility]IN [facility] ENTRANCE AND                            FOLLOW  SIGNS TO SHORT STAY CENTER                 ARRIVE AT SHORT STAY AT: 7:45 AM               CALL THIS NUMBER IF ANY PROBLEMS THE DAY OF SURGERY :               [phone]                                REMEMBER:   Do not eat food or drink liquids AFTER MIDNIGHT                  Take these medicines the morning of surgery with               A SIPS OF WATER :  AMLODIPINE / VESICARE / LEXAPRO / PREVACID / LEVOTHYROXINE / HYDROCODONE /               STOP ASPIRIN AND HERBAL MEDS 7 DAYS PREOP               NO ESTROGEN FOR 4 WKS PREOP      Do not wear jewelry, make-up   Do not wear lotions, powders, or perfumes.   Do not shave legs or underarms 12 hrs. before surgery (men may shave face)  Do not bring valuables to the hospital.  Contacts, dentures or bridgework may not be worn into surgery.  Leave suitcase in the car. After surgery it may be brought to your room.  For patients admitted to the hospital more than one night, checkout time is            11:00 AM                                                         ________________________________________________________________________                                                                        [place]  Before surgery, you can play an important role.  Because skin is not sterile, your skin needs to be as free of germs as possible.  You can reduce the number of germs on your skin by washing with CHG (chlorahexidine gluconate) soap before surgery.  CHG is an antiseptic cleaner which kills germs and bonds with the skin to continue killing germs even after washing. Please DO NOT use if  you have an allergy to CHG or antibacterial soaps.  If your skin becomes reddened/irritated stop using the CHG and  inform your nurse when you arrive at Short Stay. Do not shave (including legs and underarms) for at least 48 hours prior to the first CHG shower.  You may shave your face. Please follow these instructions carefully:   1.  Shower with CHG Soap the night before surgery and the  morning of Surgery.   2.  If you choose to wash your hair, wash your hair first as usual with your  normal  Shampoo.   3.  After you shampoo, rinse your hair and body thoroughly to remove the  shampoo.                                         4.  Use CHG as you would any other liquid soap.  You can apply chg directly  to the skin and wash . Gently wash with scrungie or clean wascloth    5.  Apply the CHG Soap to your body ONLY FROM THE NECK DOWN.   Do not use on open                           Wound or open sores. Avoid contact with eyes, ears mouth and genitals (private parts).                        Genitals (private parts) with your normal soap.              6.  Wash thoroughly, paying special attention to the area where your surgery  will be performed.   7.  Thoroughly rinse your body with warm water from the neck down.   8.  DO NOT shower/wash with your normal soap after using and rinsing off  the CHG Soap .                9.  Pat yourself dry with a clean towel.             10.  Wear clean pajamas.             11.  Place clean sheets on your bed the night of your first shower and do not  sleep with pets.  Day of Surgery : Do not apply any lotions/deodorants the morning of surgery.  Please wear clean clothes to the hospital/surgery center.  FAILURE TO FOLLOW THESE INSTRUCTIONS MAY RESULT IN THE CANCELLATION OF YOUR SURGERY    PATIENT SIGNATURE_________________________________  ______________________________________________________________________

## 2013-12-30 ENCOUNTER — Encounter (HOSPITAL_COMMUNITY)
Admission: RE | Admit: 2013-12-30 | Discharge: 2013-12-30 | Disposition: A | Discharge status: Home / Self Care | Payer: BC Managed Care – PPO | Source: Ambulatory Visit | Attending: General Surgery | Admitting: General Surgery

## 2013-12-30 ENCOUNTER — Ambulatory Visit (INDEPENDENT_AMBULATORY_CARE_PROVIDER_SITE_OTHER): Payer: BC Managed Care – PPO | Admitting: General Surgery

## 2013-12-30 ENCOUNTER — Encounter (HOSPITAL_COMMUNITY): Payer: Self-pay

## 2013-12-30 DIAGNOSIS — I10 Essential (primary) hypertension: Secondary | ICD-10-CM | POA: Insufficient documentation

## 2013-12-30 DIAGNOSIS — M1990 Unspecified osteoarthritis, unspecified site: Secondary | ICD-10-CM | POA: Diagnosis not present

## 2013-12-30 DIAGNOSIS — E785 Hyperlipidemia, unspecified: Secondary | ICD-10-CM | POA: Diagnosis not present

## 2013-12-30 DIAGNOSIS — K219 Gastro-esophageal reflux disease without esophagitis: Secondary | ICD-10-CM | POA: Diagnosis not present

## 2013-12-30 DIAGNOSIS — R7309 Other abnormal glucose: Secondary | ICD-10-CM | POA: Insufficient documentation

## 2013-12-30 DIAGNOSIS — Z01812 Encounter for preprocedural laboratory examination: Secondary | ICD-10-CM | POA: Insufficient documentation

## 2013-12-30 DIAGNOSIS — Z6841 Body Mass Index (BMI) 40.0 and over, adult: Secondary | ICD-10-CM | POA: Insufficient documentation

## 2013-12-30 DIAGNOSIS — E039 Hypothyroidism, unspecified: Secondary | ICD-10-CM | POA: Insufficient documentation

## 2013-12-30 HISTORY — DX: Unspecified urinary incontinence: R32

## 2013-12-30 HISTORY — DX: Other specified postprocedural states: Z98.890

## 2013-12-30 HISTORY — DX: Sleep disorder, unspecified: G47.9

## 2013-12-30 HISTORY — DX: Other allergy status, other than to drugs and biological substances: Z91.09

## 2013-12-30 HISTORY — DX: Adverse effect of unspecified anesthetic, initial encounter: T41.45XA

## 2013-12-30 HISTORY — DX: Other complications of anesthesia, initial encounter: T88.59XA

## 2013-12-30 HISTORY — DX: Nausea with vomiting, unspecified: R11.2

## 2013-12-30 LAB — CBC WITH DIFFERENTIAL/PLATELET
BASOS ABS: 0.1 10*3/uL (ref 0.0–0.1)
BASOS PCT: 1 % (ref 0–1)
Eosinophils Absolute: 0.2 10*3/uL (ref 0.0–0.7)
Eosinophils Relative: 2 % (ref 0–5)
HCT: 41.7 % (ref 36.0–46.0)
HEMOGLOBIN: 12.2 g/dL (ref 12.0–15.0)
Lymphocytes Relative: 25 % (ref 12–46)
Lymphs Abs: 2.1 10*3/uL (ref 0.7–4.0)
MCH: 25.3 pg — ABNORMAL LOW (ref 26.0–34.0)
MCHC: 29.3 g/dL — AB (ref 30.0–36.0)
MCV: 86.5 fL (ref 78.0–100.0)
Monocytes Absolute: 0.6 10*3/uL (ref 0.1–1.0)
Monocytes Relative: 7 % (ref 3–12)
Neutro Abs: 5.5 10*3/uL (ref 1.7–7.7)
Neutrophils Relative %: 65 % (ref 43–77)
Platelets: 180 10*3/uL (ref 150–400)
RBC: 4.82 MIL/uL (ref 3.87–5.11)
RDW: 15.6 % — ABNORMAL HIGH (ref 11.5–15.5)
WBC: 8.4 10*3/uL (ref 4.0–10.5)

## 2013-12-30 LAB — COMPREHENSIVE METABOLIC PANEL
ALBUMIN: 4.3 g/dL (ref 3.5–5.2)
ALK PHOS: 80 U/L (ref 39–117)
ALT: 27 U/L (ref 0–35)
AST: 39 U/L — AB (ref 0–37)
Anion gap: 14 (ref 5–15)
BILIRUBIN TOTAL: 0.3 mg/dL (ref 0.3–1.2)
BUN: 20 mg/dL (ref 6–23)
CHLORIDE: 98 meq/L (ref 96–112)
CO2: 28 mEq/L (ref 19–32)
Calcium: 10.6 mg/dL — ABNORMAL HIGH (ref 8.4–10.5)
Creatinine, Ser: 0.79 mg/dL (ref 0.50–1.10)
GFR calc Af Amer: >90 mL/min (ref >90)
GFR calc non Af Amer: 89 mL/min — ABNORMAL LOW (ref >90)
Glucose, Bld: 83 mg/dL (ref 70–99)
Potassium: 4.7 mEq/L (ref 3.7–5.3)
SODIUM: 140 meq/L (ref 137–147)
Total Protein: 8.5 g/dL — ABNORMAL HIGH (ref 6.0–8.3)

## 2013-12-30 NOTE — Progress Notes (Signed)
12/30/13 1351  OBSTRUCTIVE SLEEP APNEA  Have you ever been diagnosed with sleep apnea through a sleep study? No  Do you snore loudly (loud enough to be heard through closed doors)?  0  Do you often feel tired, fatigued, or sleepy during the daytime? 0  Has anyone observed you stop breathing during your sleep? 0  Do you have, or are you being treated for high blood pressure? 1  BMI more than 35 kg/m2? 1  Age over 59 years old? 1  Neck circumference greater than 40 cm/16 inches? 1  Gender: 0  Obstructive Sleep Apnea Score 4  Score 4 or greater  Results sent to PCP

## 2014-01-01 ENCOUNTER — Telehealth (INDEPENDENT_AMBULATORY_CARE_PROVIDER_SITE_OTHER): Payer: Self-pay

## 2014-01-01 NOTE — Progress Notes (Signed)
Dr.Wilson and/or Bariatric coordinator contacted x3 about needing new orders for consent

## 2014-01-01 NOTE — Progress Notes (Signed)
Dr. Redmond Pulling - pt states she is having gastric sleeve not gastric bypass. We need new order in [facility] for consent please

## 2014-01-01 NOTE — Progress Notes (Signed)
Spoke with bariatric coordinator on [phone] concerning need for new orders.

## 2014-01-01 NOTE — Telephone Encounter (Signed)
[facility] pre op calling b/c they couldn't get the pt to sign her sx consent b/c the order is incorrect having the pt getting the RNY but instead it should be the gastric sleeve. I advised [facility] that you were out of the office but you would have to correct it the day of sx on Monday then the pt could sign before sx.

## 2014-01-04 ENCOUNTER — Encounter (HOSPITAL_COMMUNITY)
Admission: RE | Disposition: A | Discharge status: Home / Self Care | Payer: Self-pay | Source: Ambulatory Visit | Attending: General Surgery

## 2014-01-04 ENCOUNTER — Encounter (HOSPITAL_COMMUNITY): Payer: Self-pay

## 2014-01-04 ENCOUNTER — Inpatient Hospital Stay (HOSPITAL_COMMUNITY): Payer: BC Managed Care – PPO | Admitting: Anesthesiology

## 2014-01-04 ENCOUNTER — Other Ambulatory Visit (INDEPENDENT_AMBULATORY_CARE_PROVIDER_SITE_OTHER): Payer: Self-pay | Admitting: General Surgery

## 2014-01-04 ENCOUNTER — Inpatient Hospital Stay (HOSPITAL_COMMUNITY)
Admission: RE | Admit: 2014-01-04 | Discharge: 2014-01-06 | DRG: 620 | Disposition: A | Discharge status: Home / Self Care | Payer: BC Managed Care – PPO | Source: Ambulatory Visit | Attending: General Surgery | Admitting: General Surgery

## 2014-01-04 ENCOUNTER — Encounter (HOSPITAL_COMMUNITY): Payer: BC Managed Care – PPO | Admitting: Anesthesiology

## 2014-01-04 DIAGNOSIS — R0609 Other forms of dyspnea: Secondary | ICD-10-CM | POA: Diagnosis present

## 2014-01-04 DIAGNOSIS — Z01812 Encounter for preprocedural laboratory examination: Secondary | ICD-10-CM

## 2014-01-04 DIAGNOSIS — R7303 Prediabetes: Secondary | ICD-10-CM | POA: Diagnosis present

## 2014-01-04 DIAGNOSIS — E039 Hypothyroidism, unspecified: Secondary | ICD-10-CM | POA: Diagnosis present

## 2014-01-04 DIAGNOSIS — Z6841 Body Mass Index (BMI) 40.0 and over, adult: Secondary | ICD-10-CM

## 2014-01-04 DIAGNOSIS — Z23 Encounter for immunization: Secondary | ICD-10-CM | POA: Diagnosis not present

## 2014-01-04 DIAGNOSIS — D259 Leiomyoma of uterus, unspecified: Secondary | ICD-10-CM | POA: Diagnosis present

## 2014-01-04 DIAGNOSIS — Y838 Other surgical procedures as the cause of abnormal reaction of the patient, or of later complication, without mention of misadventure at the time of the procedure: Secondary | ICD-10-CM | POA: Diagnosis not present

## 2014-01-04 DIAGNOSIS — I519 Heart disease, unspecified: Secondary | ICD-10-CM | POA: Diagnosis not present

## 2014-01-04 DIAGNOSIS — Z9981 Dependence on supplemental oxygen: Secondary | ICD-10-CM

## 2014-01-04 DIAGNOSIS — Z79899 Other long term (current) drug therapy: Secondary | ICD-10-CM | POA: Diagnosis not present

## 2014-01-04 DIAGNOSIS — I4891 Unspecified atrial fibrillation: Secondary | ICD-10-CM | POA: Diagnosis not present

## 2014-01-04 DIAGNOSIS — I480 Paroxysmal atrial fibrillation: Secondary | ICD-10-CM

## 2014-01-04 DIAGNOSIS — Z882 Allergy status to sulfonamides status: Secondary | ICD-10-CM | POA: Diagnosis not present

## 2014-01-04 DIAGNOSIS — Z888 Allergy status to other drugs, medicaments and biological substances status: Secondary | ICD-10-CM | POA: Diagnosis not present

## 2014-01-04 DIAGNOSIS — Z8249 Family history of ischemic heart disease and other diseases of the circulatory system: Secondary | ICD-10-CM | POA: Diagnosis not present

## 2014-01-04 DIAGNOSIS — K219 Gastro-esophageal reflux disease without esophagitis: Secondary | ICD-10-CM | POA: Diagnosis present

## 2014-01-04 DIAGNOSIS — I2789 Other specified pulmonary heart diseases: Secondary | ICD-10-CM | POA: Diagnosis present

## 2014-01-04 DIAGNOSIS — Z833 Family history of diabetes mellitus: Secondary | ICD-10-CM | POA: Diagnosis not present

## 2014-01-04 DIAGNOSIS — Z803 Family history of malignant neoplasm of breast: Secondary | ICD-10-CM

## 2014-01-04 DIAGNOSIS — Z8049 Family history of malignant neoplasm of other genital organs: Secondary | ICD-10-CM

## 2014-01-04 DIAGNOSIS — E785 Hyperlipidemia, unspecified: Secondary | ICD-10-CM | POA: Diagnosis present

## 2014-01-04 DIAGNOSIS — I10 Essential (primary) hypertension: Secondary | ICD-10-CM | POA: Diagnosis present

## 2014-01-04 DIAGNOSIS — K7689 Other specified diseases of liver: Secondary | ICD-10-CM | POA: Diagnosis present

## 2014-01-04 DIAGNOSIS — I369 Nonrheumatic tricuspid valve disorder, unspecified: Secondary | ICD-10-CM

## 2014-01-04 DIAGNOSIS — I9789 Other postprocedural complications and disorders of the circulatory system, not elsewhere classified: Secondary | ICD-10-CM

## 2014-01-04 DIAGNOSIS — M1710 Unilateral primary osteoarthritis, unspecified knee: Secondary | ICD-10-CM | POA: Diagnosis present

## 2014-01-04 DIAGNOSIS — M1990 Unspecified osteoarthritis, unspecified site: Secondary | ICD-10-CM | POA: Diagnosis present

## 2014-01-04 DIAGNOSIS — Z9884 Bariatric surgery status: Secondary | ICD-10-CM

## 2014-01-04 DIAGNOSIS — R0989 Other specified symptoms and signs involving the circulatory and respiratory systems: Secondary | ICD-10-CM | POA: Diagnosis present

## 2014-01-04 DIAGNOSIS — R0600 Dyspnea, unspecified: Secondary | ICD-10-CM

## 2014-01-04 HISTORY — PX: LAPAROSCOPIC GASTRIC SLEEVE RESECTION: SHX5895

## 2014-01-04 HISTORY — PX: UPPER GI ENDOSCOPY: SHX6162

## 2014-01-04 LAB — HEMOGLOBIN AND HEMATOCRIT, BLOOD
HCT: 38.6 % (ref 36.0–46.0)
HEMOGLOBIN: 11.5 g/dL — AB (ref 12.0–15.0)

## 2014-01-04 LAB — GLUCOSE, CAPILLARY: Glucose-Capillary: 154 mg/dL — ABNORMAL HIGH (ref 70–99)

## 2014-01-04 LAB — TROPONIN I
Troponin I: <0.3 ng/mL (ref <0.30)
Troponin I: <0.3 ng/mL (ref <0.30)

## 2014-01-04 LAB — MRSA PCR SCREENING: MRSA BY PCR: POSITIVE — AB

## 2014-01-04 SURGERY — GASTRECTOMY, SLEEVE, LAPAROSCOPIC
Anesthesia: General

## 2014-01-04 MED ORDER — BUPIVACAINE-EPINEPHRINE 0.25% -1:200000 IJ SOLN
INTRAMUSCULAR | Status: AC
  Filled 2014-01-04: qty 1

## 2014-01-04 MED ORDER — FENTANYL CITRATE 0.05 MG/ML IJ SOLN
INTRAMUSCULAR | Status: AC
  Filled 2014-01-04: qty 2

## 2014-01-04 MED ORDER — DILTIAZEM HCL 25 MG/5ML IV SOLN
INTRAVENOUS | Status: AC
  Filled 2014-01-04: qty 5

## 2014-01-04 MED ORDER — LIDOCAINE HCL (CARDIAC) 20 MG/ML IV SOLN
INTRAVENOUS | Status: DC | PRN
  Administered 2014-01-04: 60 mg via INTRAVENOUS

## 2014-01-04 MED ORDER — DILTIAZEM LOAD VIA INFUSION
10.0000 mg | Freq: Once | INTRAVENOUS | Status: AC
  Administered 2014-01-04: 10 mg via INTRAVENOUS
  Filled 2014-01-04: qty 10

## 2014-01-04 MED ORDER — PANTOPRAZOLE SODIUM 40 MG IV SOLR
40.0000 mg | INTRAVENOUS | Status: DC
  Administered 2014-01-04 – 2014-01-05 (×2): 40 mg via INTRAVENOUS
  Filled 2014-01-04 (×3): qty 40

## 2014-01-04 MED ORDER — PERFLUTREN LIPID MICROSPHERE
1.0000 mL | INTRAVENOUS | Status: AC | PRN
  Administered 2014-01-04: 2 mL via INTRAVENOUS
  Filled 2014-01-04: qty 10

## 2014-01-04 MED ORDER — LACTATED RINGERS IR SOLN
Status: DC | PRN
Start: 2014-01-04 — End: 2014-01-04
  Administered 2014-01-04: 3000 mL

## 2014-01-04 MED ORDER — PROMETHAZINE HCL 25 MG/ML IJ SOLN
6.2500 mg | INTRAMUSCULAR | Status: DC | PRN
Start: 2014-01-04 — End: 2014-01-04

## 2014-01-04 MED ORDER — KCL IN DEXTROSE-NACL 20-5-0.45 MEQ/L-%-% IV SOLN
INTRAVENOUS | Status: DC
  Administered 2014-01-04 – 2014-01-05 (×4): via INTRAVENOUS
  Administered 2014-01-06: 125 mL via INTRAVENOUS
  Filled 2014-01-04 (×8): qty 1000

## 2014-01-04 MED ORDER — PHENYLEPHRINE HCL 10 MG/ML IJ SOLN
INTRAMUSCULAR | Status: DC | PRN
Start: 2014-01-04 — End: 2014-01-04
  Administered 2014-01-04 (×3): 80 ug via INTRAVENOUS

## 2014-01-04 MED ORDER — DEXAMETHASONE SODIUM PHOSPHATE 10 MG/ML IJ SOLN
INTRAMUSCULAR | Status: DC | PRN
Start: 2014-01-04 — End: 2014-01-04
  Administered 2014-01-04: 10 mg via INTRAVENOUS

## 2014-01-04 MED ORDER — PROPOFOL 10 MG/ML IV BOLUS
INTRAVENOUS | Status: AC
  Filled 2014-01-04: qty 20

## 2014-01-04 MED ORDER — FENTANYL CITRATE 0.05 MG/ML IJ SOLN
25.0000 ug | INTRAMUSCULAR | Status: DC | PRN
  Administered 2014-01-04 (×2): 50 ug via INTRAVENOUS

## 2014-01-04 MED ORDER — PROPOFOL 10 MG/ML IV BOLUS
INTRAVENOUS | Status: DC | PRN
Start: 2014-01-04 — End: 2014-01-04
  Administered 2014-01-04: 200 mg via INTRAVENOUS
  Administered 2014-01-04: 20 mg via INTRAVENOUS

## 2014-01-04 MED ORDER — SODIUM CHLORIDE 0.9 % IV SOLN: INTRAVENOUS | Status: DC

## 2014-01-04 MED ORDER — FENTANYL CITRATE 0.05 MG/ML IJ SOLN
INTRAMUSCULAR | Status: DC | PRN
  Administered 2014-01-04 (×2): 50 ug via INTRAVENOUS
  Administered 2014-01-04: 100 ug via INTRAVENOUS
  Administered 2014-01-04: 50 ug via INTRAVENOUS

## 2014-01-04 MED ORDER — DEXTROSE 5 % IV SOLN
5.0000 mg/h | INTRAVENOUS | Status: DC
  Administered 2014-01-04 (×2): 5 mg/h via INTRAVENOUS
  Filled 2014-01-04: qty 100

## 2014-01-04 MED ORDER — TISSEEL VH 10 ML EX KIT
PACK | CUTANEOUS | Status: AC
  Filled 2014-01-04: qty 2

## 2014-01-04 MED ORDER — UNJURY CHICKEN SOUP POWDER: 2.0000 [oz_av] | Freq: Four times a day (QID) | ORAL | Status: DC

## 2014-01-04 MED ORDER — ROCURONIUM BROMIDE 100 MG/10ML IV SOLN
INTRAVENOUS | Status: AC
  Filled 2014-01-04: qty 1

## 2014-01-04 MED ORDER — OXYCODONE HCL 5 MG/5ML PO SOLN
5.0000 mg | ORAL | Status: DC | PRN
  Administered 2014-01-05 (×2): 5 mg via ORAL
  Filled 2014-01-04 (×3): qty 5

## 2014-01-04 MED ORDER — ALUM & MAG HYDROXIDE-SIMETH 200-200-20 MG/5ML PO SUSP: 15.0000 mL | ORAL | Status: DC | PRN

## 2014-01-04 MED ORDER — FENTANYL CITRATE 0.05 MG/ML IJ SOLN
INTRAMUSCULAR | Status: AC
  Filled 2014-01-04: qty 5

## 2014-01-04 MED ORDER — LACTATED RINGERS IV SOLN
INTRAVENOUS | Status: DC
  Administered 2014-01-04: 1000 mL via INTRAVENOUS

## 2014-01-04 MED ORDER — GLYCOPYRROLATE 0.2 MG/ML IJ SOLN
INTRAMUSCULAR | Status: DC | PRN
  Administered 2014-01-04: .6 mg via INTRAVENOUS

## 2014-01-04 MED ORDER — EPHEDRINE SULFATE 50 MG/ML IJ SOLN
INTRAMUSCULAR | Status: DC | PRN
  Administered 2014-01-04: 5 mg via INTRAVENOUS

## 2014-01-04 MED ORDER — NEOSTIGMINE METHYLSULFATE 10 MG/10ML IV SOLN
INTRAVENOUS | Status: DC | PRN
  Administered 2014-01-04: 4 mg via INTRAVENOUS

## 2014-01-04 MED ORDER — CHLORHEXIDINE GLUCONATE 4 % EX LIQD: 60.0000 mL | Freq: Once | CUTANEOUS | Status: DC

## 2014-01-04 MED ORDER — PHENYLEPHRINE 40 MCG/ML (10ML) SYRINGE FOR IV PUSH (FOR BLOOD PRESSURE SUPPORT)
PREFILLED_SYRINGE | INTRAVENOUS | Status: AC
  Filled 2014-01-04: qty 10

## 2014-01-04 MED ORDER — CHLORHEXIDINE GLUCONATE CLOTH 2 % EX PADS
6.0000 | MEDICATED_PAD | Freq: Every day | CUTANEOUS | Status: DC
  Administered 2014-01-06: 6 via TOPICAL

## 2014-01-04 MED ORDER — LIDOCAINE HCL (CARDIAC) 20 MG/ML IV SOLN
INTRAVENOUS | Status: AC
  Filled 2014-01-04: qty 5

## 2014-01-04 MED ORDER — ACETAMINOPHEN 10 MG/ML IV SOLN
INTRAVENOUS | Status: DC | PRN
  Administered 2014-01-04: 1000 mg via INTRAVENOUS

## 2014-01-04 MED ORDER — MEPERIDINE HCL 50 MG/ML IJ SOLN: 6.2500 mg | INTRAMUSCULAR | Status: DC | PRN

## 2014-01-04 MED ORDER — ALBUTEROL SULFATE (2.5 MG/3ML) 0.083% IN NEBU: 2.5000 mg | INHALATION_SOLUTION | Freq: Four times a day (QID) | RESPIRATORY_TRACT | Status: DC | PRN

## 2014-01-04 MED ORDER — ENOXAPARIN SODIUM 40 MG/0.4ML ~~LOC~~ SOLN
40.0000 mg | Freq: Two times a day (BID) | SUBCUTANEOUS | Status: DC
  Administered 2014-01-05 – 2014-01-06 (×3): 40 mg via SUBCUTANEOUS
  Filled 2014-01-04 (×5): qty 0.4

## 2014-01-04 MED ORDER — 0.9 % SODIUM CHLORIDE (POUR BTL) OPTIME
TOPICAL | Status: DC | PRN
  Administered 2014-01-04: 1000 mL

## 2014-01-04 MED ORDER — PROMETHAZINE HCL 25 MG/ML IJ SOLN: 6.2500 mg | INTRAMUSCULAR | Status: DC | PRN

## 2014-01-04 MED ORDER — ONDANSETRON HCL 4 MG/2ML IJ SOLN
4.0000 mg | INTRAMUSCULAR | Status: DC | PRN
  Administered 2014-01-05: 4 mg via INTRAVENOUS
  Filled 2014-01-04 (×2): qty 2

## 2014-01-04 MED ORDER — DEXMEDETOMIDINE HCL 200 MCG/2ML IV SOLN
INTRAVENOUS | Status: DC | PRN
  Administered 2014-01-04: 8 ug via INTRAVENOUS
  Administered 2014-01-04 (×2): 10 ug via INTRAVENOUS
  Administered 2014-01-04: 12 ug via INTRAVENOUS

## 2014-01-04 MED ORDER — ACETAMINOPHEN 160 MG/5ML PO SOLN: 650.0000 mg | ORAL | Status: DC | PRN

## 2014-01-04 MED ORDER — MIDAZOLAM HCL 2 MG/2ML IJ SOLN
INTRAMUSCULAR | Status: AC
  Filled 2014-01-04: qty 2

## 2014-01-04 MED ORDER — ESMOLOL HCL 10 MG/ML IV SOLN
5.0000 mg | Freq: Once | INTRAVENOUS | Status: AC
  Administered 2014-01-04: 5 mg via INTRAVENOUS
  Filled 2014-01-04: qty 0.5

## 2014-01-04 MED ORDER — MORPHINE SULFATE 2 MG/ML IJ SOLN
2.0000 mg | INTRAMUSCULAR | Status: DC | PRN
  Administered 2014-01-04: 2 mg via INTRAVENOUS
  Filled 2014-01-04: qty 1

## 2014-01-04 MED ORDER — ONDANSETRON HCL 4 MG/2ML IJ SOLN
INTRAMUSCULAR | Status: DC | PRN
  Administered 2014-01-04: 4 mg via INTRAVENOUS

## 2014-01-04 MED ORDER — ACETAMINOPHEN 160 MG/5ML PO SOLN: 325.0000 mg | ORAL | Status: DC | PRN

## 2014-01-04 MED ORDER — GLYCOPYRROLATE 0.2 MG/ML IJ SOLN
INTRAMUSCULAR | Status: AC
  Filled 2014-01-04: qty 3

## 2014-01-04 MED ORDER — ONDANSETRON HCL 4 MG/2ML IJ SOLN
INTRAMUSCULAR | Status: AC
  Filled 2014-01-04: qty 2

## 2014-01-04 MED ORDER — SUCCINYLCHOLINE CHLORIDE 20 MG/ML IJ SOLN
INTRAMUSCULAR | Status: DC | PRN
  Administered 2014-01-04: 100 mg via INTRAVENOUS

## 2014-01-04 MED ORDER — ESMOLOL HCL 10 MG/ML IV SOLN
INTRAVENOUS | Status: AC
  Filled 2014-01-04: qty 10

## 2014-01-04 MED ORDER — UNJURY CHOCOLATE CLASSIC POWDER: 2.0000 [oz_av] | Freq: Four times a day (QID) | ORAL | Status: DC

## 2014-01-04 MED ORDER — KETOROLAC TROMETHAMINE 30 MG/ML IJ SOLN
INTRAMUSCULAR | Status: DC | PRN
  Administered 2014-01-04: 30 mg via INTRAVENOUS

## 2014-01-04 MED ORDER — ACETAMINOPHEN 10 MG/ML IV SOLN
1000.0000 mg | Freq: Once | INTRAVENOUS | Status: DC
  Filled 2014-01-04: qty 100

## 2014-01-04 MED ORDER — KETOROLAC TROMETHAMINE 30 MG/ML IJ SOLN
INTRAMUSCULAR | Status: AC
  Filled 2014-01-04: qty 1

## 2014-01-04 MED ORDER — DEXMEDETOMIDINE HCL IN NACL 200 MCG/50ML IV SOLN
0.4000 ug/kg/h | INTRAVENOUS | Status: DC
  Filled 2014-01-04: qty 50

## 2014-01-04 MED ORDER — NEOSTIGMINE METHYLSULFATE 10 MG/10ML IV SOLN
INTRAVENOUS | Status: AC
  Filled 2014-01-04: qty 1

## 2014-01-04 MED ORDER — GLYCOPYRROLATE 0.2 MG/ML IJ SOLN
INTRAMUSCULAR | Status: AC
  Filled 2014-01-04: qty 1

## 2014-01-04 MED ORDER — ENOXAPARIN SODIUM 40 MG/0.4ML ~~LOC~~ SOLN
40.0000 mg | SUBCUTANEOUS | Status: AC
  Administered 2014-01-04: 40 mg via SUBCUTANEOUS
  Filled 2014-01-04: qty 0.4

## 2014-01-04 MED ORDER — BUPIVACAINE-EPINEPHRINE 0.25% -1:200000 IJ SOLN
INTRAMUSCULAR | Status: DC | PRN
  Administered 2014-01-04: 37 mL

## 2014-01-04 MED ORDER — UNJURY VANILLA POWDER: 2.0000 [oz_av] | Freq: Four times a day (QID) | ORAL | Status: DC

## 2014-01-04 MED ORDER — ROCURONIUM BROMIDE 100 MG/10ML IV SOLN
INTRAVENOUS | Status: DC | PRN
  Administered 2014-01-04: 20 mg via INTRAVENOUS
  Administered 2014-01-04: 5 mg via INTRAVENOUS
  Administered 2014-01-04: 20 mg via INTRAVENOUS
  Administered 2014-01-04: 50 mg via INTRAVENOUS
  Administered 2014-01-04: 10 mg via INTRAVENOUS

## 2014-01-04 MED ORDER — MUPIROCIN 2 % EX OINT
1.0000 "application " | TOPICAL_OINTMENT | Freq: Two times a day (BID) | CUTANEOUS | Status: DC
  Administered 2014-01-04 – 2014-01-06 (×4): 1 via NASAL
  Filled 2014-01-04 (×2): qty 22

## 2014-01-04 MED ORDER — FENTANYL CITRATE 0.05 MG/ML IJ SOLN: 25.0000 ug | INTRAMUSCULAR | Status: DC | PRN

## 2014-01-04 MED ORDER — METOCLOPRAMIDE HCL 5 MG/ML IJ SOLN
INTRAMUSCULAR | Status: DC | PRN
  Administered 2014-01-04: 10 mg via INTRAVENOUS

## 2014-01-04 MED ORDER — DEXTROSE 5 % IV SOLN
2.0000 g | INTRAVENOUS | Status: AC
  Administered 2014-01-04: 2 g via INTRAVENOUS

## 2014-01-04 MED ORDER — CEFOXITIN SODIUM 2 G IV SOLR
INTRAVENOUS | Status: AC
  Filled 2014-01-04: qty 2

## 2014-01-04 MED ORDER — MIDAZOLAM HCL 5 MG/5ML IJ SOLN
INTRAMUSCULAR | Status: DC | PRN
  Administered 2014-01-04: 2 mg via INTRAVENOUS

## 2014-01-04 SURGICAL SUPPLY — 64 items
ADH SKN CLS APL DERMABOND .7 (GAUZE/BANDAGES/DRESSINGS) ×1
APL SRG 32X5 SNPLK LF DISP (MISCELLANEOUS)
APPLICATOR COTTON TIP 6IN STRL (MISCELLANEOUS) IMPLANT
APPLIER CLIP ROT 10 11.4 M/L (STAPLE)
APR CLP MED LRG 11.4X10 (STAPLE)
BLADE SURG SZ11 CARB STEEL (BLADE) ×3 IMPLANT
CABLE HIGH FREQUENCY MONO STRZ (ELECTRODE) ×3 IMPLANT
CHLORAPREP W/TINT 26ML (MISCELLANEOUS) ×6 IMPLANT
CLIP APPLIE ROT 10 11.4 M/L (STAPLE) IMPLANT
DERMABOND ADVANCED (GAUZE/BANDAGES/DRESSINGS) ×1
DERMABOND ADVANCED .7 DNX12 (GAUZE/BANDAGES/DRESSINGS) ×2 IMPLANT
DEVICE SUT QUICK LOAD TK 5 (STAPLE) IMPLANT
DEVICE SUT TI-KNOT TK 5X26 (MISCELLANEOUS) IMPLANT
DEVICE SUTURE ENDOST 10MM (ENDOMECHANICALS) IMPLANT
DEVICE TROCAR PUNCTURE CLOSURE (ENDOMECHANICALS) ×3 IMPLANT
DRAPE CAMERA CLOSED 9X96 (DRAPES) ×3 IMPLANT
DRAPE UTILITY XL STRL (DRAPES) ×6 IMPLANT
ELECT REM PT RETURN 9FT ADLT (ELECTROSURGICAL) ×3
ELECTRODE REM PT RTRN 9FT ADLT (ELECTROSURGICAL) ×2 IMPLANT
GAUZE SPONGE 4X4 12PLY STRL (GAUZE/BANDAGES/DRESSINGS) IMPLANT
GLOVE BIOGEL M STRL SZ7.5 (GLOVE) ×3 IMPLANT
GOWN STRL REUS W/TWL XL LVL3 (GOWN DISPOSABLE) ×21 IMPLANT
HANDLE STAPLE EGIA 4 XL (STAPLE) ×3 IMPLANT
HOVERMATT SINGLE USE (MISCELLANEOUS) ×3 IMPLANT
KIT BASIN OR (CUSTOM PROCEDURE TRAY) ×3 IMPLANT
NEEDLE SPNL 22GX3.5 QUINCKE BK (NEEDLE) ×3 IMPLANT
PACK UNIVERSAL I (CUSTOM PROCEDURE TRAY) ×3 IMPLANT
PEN SKIN MARKING BROAD (MISCELLANEOUS) ×3 IMPLANT
RELOAD STAPLER BLUE 60MM (STAPLE) IMPLANT
RELOAD STAPLER GOLD 60MM (STAPLE) IMPLANT
RELOAD STAPLER GREEN 60MM (STAPLE) IMPLANT
RELOAD TRI 45 ART MED THCK BLK (STAPLE) ×3 IMPLANT
RELOAD TRI 60 ART MED THCK BLK (STAPLE) ×3 IMPLANT
RELOAD TRI 60 ART MED THCK PUR (STAPLE) ×15 IMPLANT
SCISSORS LAP 5X35 DISP (ENDOMECHANICALS) ×3 IMPLANT
SCISSORS LAP 5X45 EPIX DISP (ENDOMECHANICALS) ×3 IMPLANT
SEALANT SURGICAL APPL DUAL CAN (MISCELLANEOUS) IMPLANT
SET IRRIG TUBING LAPAROSCOPIC (IRRIGATION / IRRIGATOR) ×3 IMPLANT
SHEARS CURVED HARMONIC AC 45CM (MISCELLANEOUS) ×3 IMPLANT
SLEEVE ADV FIXATION 5X100MM (TROCAR) ×6 IMPLANT
SLEEVE GASTRECTOMY 36FR VISIGI (MISCELLANEOUS) ×3 IMPLANT
SLEEVE XCEL OPT CAN 5 100 (ENDOMECHANICALS) IMPLANT
SOLUTION ANTI FOG 6CC (MISCELLANEOUS) ×3 IMPLANT
STAPLE ECHEON FLEX 60 POW ENDO (STAPLE) IMPLANT
STAPLER ECHELON LONG 60 440 (INSTRUMENTS) IMPLANT
STAPLER RELOAD BLUE 60MM (STAPLE)
STAPLER RELOAD GOLD 60MM (STAPLE)
STAPLER RELOAD GREEN 60MM (STAPLE)
SUT MNCRL AB 4-0 PS2 18 (SUTURE) ×3 IMPLANT
SUT SURGIDAC NAB ES-9 0 48 120 (SUTURE) IMPLANT
SUT VIC AB 2-0 SH 27 (SUTURE) ×3
SUT VIC AB 2-0 SH 27X BRD (SUTURE) ×2 IMPLANT
SUT VICRYL 0 TIES 12 18 (SUTURE) ×3 IMPLANT
SYR 20CC LL (SYRINGE) ×3 IMPLANT
SYR 50ML LL SCALE MARK (SYRINGE) ×3 IMPLANT
TOWEL OR 17X26 10 PK STRL BLUE (TOWEL DISPOSABLE) ×3 IMPLANT
TOWEL OR NON WOVEN STRL DISP B (DISPOSABLE) ×3 IMPLANT
TRAY FOLEY CATH 14FRSI W/METER (CATHETERS) ×3 IMPLANT
TROCAR ADV FIXATION 5X100MM (TROCAR) ×3 IMPLANT
TROCAR BLADELESS 15MM (ENDOMECHANICALS) ×3 IMPLANT
TROCAR BLADELESS OPT 5 100 (ENDOMECHANICALS) ×3 IMPLANT
TUBING CONNECTING 10 (TUBING) ×3 IMPLANT
TUBING ENDO SMARTCAP (MISCELLANEOUS) ×3 IMPLANT
TUBING FILTER THERMOFLATOR (ELECTROSURGICAL) ×3 IMPLANT

## 2014-01-04 NOTE — Anesthesia Preprocedure Evaluation (Addendum)
Anesthesia Evaluation  Patient identified by MRN, date of birth, ID band Patient awake and Patient confused    Reviewed: Allergy & Precautions, H&P , NPO status , Patient's Chart, lab work & pertinent test results  Airway Mallampati: II TM Distance: >3 FB     Dental  (+) Dental Advisory Given, Teeth Intact   Pulmonary          Cardiovascular hypertension, Pt. on medications Rhythm:Regular Rate:Normal  EF 65% 2014, cardiac clearance ok   Neuro/Psych    GI/Hepatic   Endo/Other    Renal/GU      Musculoskeletal   Abdominal (+) + obese,   Peds  Hematology   Anesthesia Other Findings   Reproductive/Obstetrics                         Anesthesia Physical Anesthesia Plan  ASA: IV  Anesthesia Plan: General   Post-op Pain Management:    Induction: Intravenous  Airway Management Planned: Oral ETT  Additional Equipment:   Intra-op Plan:   Post-operative Plan:   Informed Consent: I have reviewed the patients History and Physical, chart, labs and discussed the procedure including the risks, benefits and alternatives for the proposed anesthesia with the patient or authorized representative who has indicated his/her understanding and acceptance.     Plan Discussed with:   Anesthesia Plan Comments: (Super, super, morbidly obese, multinodal pain rx)       Anesthesia Quick Evaluation

## 2014-01-04 NOTE — Op Note (Signed)
Name:  Rebekah [name] MRN: [id number] Date of Surgery: 01/04/2014  Preop Diagnosis:  Morbid Obesity  Postop Diagnosis:  Morbid Obesity (Weight - 356, BMI - 67.3), S/P Gastric Sleeve  Procedure:  Upper endoscopy  (Intraoperative)  Surgeon:  Alphonsa Overall, M.D.  Anesthesia:  GET  Indications for procedure: MARIENE DICKERMAN is a 58 y.o. female whose primary care physician is Terald Sleeper, PA-C and has completed a Gastric Sleeve today by Dr. Redmond Pulling.  I am doing an intraoperative upper endoscopy to evaluate the gastric pouch.  Operative Note: The patient is under general anesthesia.  Dr. Redmond Pulling is laparoscoping the patient while I do an upper endoscopy to evaluate the stomach pouch.  With the patient intubated, I passed the Pentax upper endoscope without difficulty down the esophagus.  The esophago-gastric junction was at 41 cm.    The mucosa of the stomach looked viable and the staple line was intact without bleeding.  I advanced to the pylorus, but did not go through it.  While I insufflated the stomach pouch with air, Dr. Redmond Pulling  flooded the upper abdomen with saline to put the gastric pouch under saline.  There was no bubbling or evidence of a leak.  Photos were taken of the gastric pouch.  There was no evidence of narrowing of the pouch and the gastric sleeve looked tubular.  The scope was then withdrawn.  The esophagus was unremarkable and the patient tolerated the endoscopy without difficulty.  Alphonsa Overall, MD, [facility] Surgery Pager: [phone] Office phone:  [phone]

## 2014-01-04 NOTE — Consult Note (Signed)
CARDIOLOGY CONSULT NOTE   Patient ID: Rebekah [name] MRN: [id number], DOB/AGE: 1955/05/23   Admit date: 01/04/2014 Date of Consult: 01/04/2014   Primary Physician: Terald Sleeper, PA-C Primary Cardiologist: Dr. Debara Pickett  Pt. Profile  super Rebekah obese 58 year old Caucasian female with past medical history significant for hypertension, morbid obesity, prediabetes, hyperlipidemia and chronic hypoventilation syndrome presented with postop a-fib with RVR after lap sleeve gastrectomy  Problem List  Past Medical History  Diagnosis Date  . Arthritis   . Hypertension   . GERD (gastroesophageal reflux disease)   . Obesity   . Complication of anesthesia   . PONV (postoperative nausea and vomiting)   . Shortness of breath     WITH EXERTION - uses 02 3L nightly / 5 L  other times with exertion   . Environmental allergies   . Incontinence of urine   . Urgency of urination   . Difficulty sleeping     Past Surgical History  Procedure Laterality Date  . Tonsillectomy    . Cholecystectomy    . Appendectomy    . Tubal ligation    . Transthoracic echocardiogram  11/2012    EF 60-65%, mod LVH & mod conc hypertrophy, grade 1 diastolic dysfunction; LA mildly dilated; RV systolic pressure increased; PA peak pressure 69mmHg  . Joint replacement  1999    L TOTAL KNEE     Allergies  Allergies  Allergen Reactions  . Mucinex [Guaifenesin Er] Anaphylaxis  . Propoxyphene Hcl     REACTION: hallucinate  . Sulfonamide Derivatives     REACTION: rash  . Hydromet [Hydrocodone-Homatropine] Hives and Rash    HPI   The patient is a super Rebekah obese 58 year old Caucasian female with past medical history significant for hypertension, morbid obesity, prediabetes, hyperlipidemia and chronic hypoventilation syndrome. She has been on home oxygen for the past year. She states she used about 3 L per minute at night in the roughly 5 L per minute during the day when she's exerting herself. She denies  being very active during the day. Her morbid obesity was significantly interfering with her lifestyle. She denies any previous cardiac history or history of atrial fibrillation. Her last echocardiogram on 11/14/2012 showed EF 60-65%, normal wall motion, grade 1 diastolic dysfunction, mildly dilated left atrium. She was previously followed by Dr. Rollene Fare and was being evaluated for bariatric surgery recently. She saw Dr. Debara Pickett on 09/16/2013 for preoperative clearance, at that time, she has some expected shortness breath on exertion however no significant chest discomfort. She was cleared from cardiology perspective to undergo bariatric surgery without further preoperative testing.  Patient underwent planned laparoscopic bariatric surgery on 01/04/2014. According to anesthesiology report, patient had some atrial ectopy prior to the surgery however was in normal sinus rhythm throughout the entire surgery. There was no complication from her procedure. Shortly after she was transferred to PACU, patient went into atrial fibrillation with RVR. She denies any cardiac awareness. Her vital has been stable in the 110 to 120s range, however her heart rate has been in the 100 to 120s. EKG was obtained which confirmed atrial fibrillation with RVR. Cardiology has been consulted for management of postoperative a-fib.   Inpatient Medications  . acetaminophen  1,000 mg Intravenous Once  . chlorhexidine  60 mL Topical Once   And  . [START ON 01/05/2014] chlorhexidine  60 mL Topical Once  . [COMPLETED] esmolol  5 mg Intravenous Once  . fentaNYL        Family History  Family History  Problem Relation Age of Onset  . Diabetes Father   . Diabetes Maternal Grandmother   . Uterine cancer Mother   . Cervical cancer Mother   . Breast cancer Mother   . Cancer Mother     cervical and brreast cancer     Social History History   Social History  . Marital Status: Married    Spouse Name: N/A    Number of Children: 2  .  Years of Education: N/A   Occupational History  . special education teacher Other    [place]  . retired Marine scientist - L&D @ [employer] Topics  . Smoking status: Never Smoker   . Smokeless tobacco: Not on file  . Alcohol Use: No  . Drug Use: No  . Sexual Activity: Not on file   Other Topics Concern  . Not on file   Social History Narrative  . No narrative on file     Review of Systems  General:  No chills, fever, night sweats or weight changes. Rebekah obese Cardiovascular:  No chest pain +chronic DOE, LE edema and orthopnea (sleep on recliner) Dermatological: No rash, lesions/masses Respiratory: No cough, dyspnea Urologic: No hematuria, dysuria Abdominal:   No nausea, vomiting, diarrhea, bright red blood per rectum, melena, or hematemesis Neurologic:  No visual changes, wkns, changes in mental status. All other systems reviewed and are otherwise negative except as noted above.  Physical Exam  Blood pressure 125/78, pulse 112, temperature 97.9 F (36.6 C), temperature source Oral, resp. rate 18, height 5\' 2"  (1.575 m), weight 340 lb 12.8 oz (154.586 kg), SpO2 100.00%.  General: Pleasant, NAD Psych: Normal affect. Neuro: Alert and oriented X 3. Moves all extremities spontaneously. HEENT: Normal  Neck: Supple without bruits or JVD. Lungs:  Resp regular and unlabored, CTA. Heart: tachycardic, irregular. no s3, s4, or murmurs. Abdomen: Soft, non-tender, non-distended, BS + x 4.  Extremities: No clubbing, cyanosis. DP/PT/Radials 2+ and equal bilaterally. LE nonpitting edema  Labs  No results found for this basename: CKTOTAL, CKMB, TROPONINI,  in the last 72 hours Lab Results  Component Value Date   WBC 8.4 12/30/2013   HGB 11.5* 01/04/2014   HCT 38.6 01/04/2014   MCV 86.5 12/30/2013   PLT 180 12/30/2013    Recent Labs Lab 12/30/13 1435  NA 140  K 4.7  CL 98  CO2 28  BUN 20  CREATININE 0.79  CALCIUM 10.6*  PROT 8.5*  BILITOT  0.3  ALKPHOS 80  ALT 27  AST 39*  GLUCOSE 83   Lab Results  Component Value Date   CHOL 223* 07/02/2013   HDL 53 07/02/2013   LDLCALC 127* 07/02/2013   TRIG 214* 07/02/2013   No results found for this basename: DDIMER    Radiology/Studies  No results found.  ECG  Atrial fibrillation with RVR with heart rates of 104  ASSESSMENT AND PLAN  1. Post-op a-fib with RVR  - check TSH, transfer to step-down  - obtain echo, trend troponin x 3  - initiate Diltiazem gtt, hopefully will self convert.   - if continue to have a-fib tomorrow, need check with primary team to start heparin and consider cardioversion (electrical vs chemical)  - CHADS-Vasc score 2 (age, female), however if this is purely postop a-fib and no recurrence, may not need systemic anticoagulation once converted  2. Morbid obesity s/p laparoscopic sleeve gastrectomy 9/21 3. Hypertension 4. Prediabetes 5. hyperlipidemia  6.  chronic hypoventilation syndrome on home O2  - 3L at night, PRN 5 L during day depend on activity    Signed, Almyra Deforest, PA-C 01/04/2014, 2:39 PM Patient seen and examined and history reviewed. Agree with above findings and plan. Rebekah [name] with history of chronic hypoventilation, HTN, prediabetes, and HL is seen post op bariatric surgery. Post op she developed Afib with RVR. No history of Afib. She is asymptomatic. No chest pain or SOB. Exam reveals Afib with rate 120s. No JVD. Lungs are clear. CV IRR without murmur or gallop. No edema.  Afib is probably related to stress of surgery. Will check TSH and cycle cardiac enzymes. Will control rate with IV cardizem. Since she is immediately post op I would not anticoagulate at this point. She likely will convert in the next 24 hours. If not may need to consider DCCV.   Peter Martinique, [place] 01/04/2014 3:19 PM

## 2014-01-04 NOTE — Progress Notes (Signed)
Clarified with Dr Redmond Pulling, pt to have FOLEY OVERNIGHT.

## 2014-01-04 NOTE — H&P (Signed)
History of Present Illness Rebekah [name] M. Fifi Schindler [name]; 01/04/2014 9:37 AM) Patient words: eval for bairtric surgery sleeve.  The patient is a 58 year old female who presents for a pre-op visit. The procedure scheduled is a Lap sleeve gastrectomy 01/04/14. The surgeon for the procedure will be Gayland Curry, [name].  Her initial visit weight on 07/02/13 was 372 pounds. Her last visit weight on 10/28/13 was 356 pounds.  She denies any changes since she was last seen. She has several questions regarding upcoming surgery. She is still having daily musculoskeletal difficulities with OA in hands and knees. otherwise she denies any new medications or trips to hospital/ED.  Her UGI was normal. Her cxr was normal. The calcified pelvic mass turned out to be uterine fibroid. She was cleared by cardiology by Dr Debara Pickett for surgery.  Her evaluation labs showed prediabetes with an A1C of 6.1. Her lipid panel was abnormal - Total cholesterol 223, LDL 127, TG 214. Her mammogram was normal.   Other Problems Gayland Curry, [name]; 01/04/2014 9:32 AM) Arthritis Bladder Problems Gastroesophageal Reflux Disease Hemorrhoids High blood pressure Home Oxygen Use Migraine Headache Thyroid Disease MORBID OBESITY, UNSPECIFIED OBESITY TYPE (278.01  E66.01) DYSLIPIDEMIA (272.4  E78.5) PREDIABETES (790.29  R73.09) A1C 07/02/13 - 6.1 OSTEOARTHRITIS OF BOTH KNEES, UNSPECIFIED OSTEOARTHRITIS TYPE (715.96  M17.0)  Past Surgical History Marjean Donna, CMA; 12/30/2013 11:16 AM) Appendectomy Gallbladder Surgery - Laparoscopic Knee Surgery Left. Tonsillectomy  Diagnostic Studies History Marjean Donna, CMA; 12/30/2013 11:16 AM) Colonoscopy never Mammogram within last year Pap Smear >5 years ago  Allergies Marjean Donna, CMA; 12/30/2013 11:21 AM) Mucinex *COUGH/COLD/ALLERGY* Propoxyphene Compound *ANALGESICS - OPIOID* Sulfonamide Derivatives Hydromet *COUGH/COLD/ALLERGY*  Medication History (Sonya Bynum, CMA;  12/30/2013 11:20 AM) Hydrocodone-Acetaminophen (10-325MG  Tablet, Oral as needed) Active. AmLODIPine Besylate (10MG  Tablet, Oral daily) Active. Furosemide (40MG  Tablet, Oral daily) Active. Klor-Con M20 [facility] Tablet ER, Oral daily) Active. VESIcare (10MG  Tablet, Oral daily) Active. Valsartan (320MG  Tablet, Oral daily) Active. Levothyroxine Sodium (50MCG Tablet, Oral daily) Active. Lansoprazole (30MG  Capsule DR, Oral daily) Active. Acetaminophen-Codeine #3 (300-30MG  Tablet, Oral as needed) Active. Lexapro (20MG  Tablet, Oral daily) Active.  Social History ([place]; 12/30/2013 11:16 AM) Caffeine use Tea. No alcohol use No drug use Tobacco use Never smoker.  Family History Marjean Donna, [place]; 12/30/2013 11:16 AM) Arthritis Mother. Breast Cancer Mother. Cervical Cancer Mother. Diabetes Mellitus Brother, Father. Hypertension Brother, Father, Mother.  Pregnancy / Birth History Marjean Donna, [place]; 12/30/2013 11:16 AM) Age at menarche 85 years. Age of menopause 48-55 Gravida 2 Irregular periods Maternal age 69-30 Para 2  Review of Systems Rebekah [name] M. Miel Wisener [name]; 01/04/2014 9:27 AM) General Not Present- Appetite Loss, Chills, Fatigue, Fever, Night Sweats, Weight Gain and Weight Loss. Skin Present- Dryness. Not Present- Change in Wart/Mole, Hives, Jaundice, New Lesions, Non-Healing Wounds, Rash and Ulcer. HEENT Present- Seasonal Allergies and Wears glasses/contact lenses. Not Present- Earache, Hearing Loss, Hoarseness, Nose Bleed, Oral Ulcers, Ringing in the Ears, Sinus Pain, Sore Throat, Visual Disturbances and Yellow Eyes. Respiratory Present- Difficulty Breathing and Snoring. Not Present- Bloody sputum, Chronic Cough and Wheezing. Note: uses O2 at night. had sleep study several yrs which showed no OSA. +DOE. no SOB at rest   Breast Not Present- Breast Mass, Breast Pain, Nipple Discharge and Skin Changes. Cardiovascular Present- Swelling of Extremities. Not  Present- Chest Pain, Difficulty Breathing Lying Down, Leg Cramps, Palpitations, Rapid Heart Rate and Shortness of Breath. Gastrointestinal Present- Heartburn and Hemorrhoids. Not Present- Abdominal Pain, Bloating, Bloody Stool, Change in Bowel  Habits, Chronic diarrhea, Constipation, Difficulty Swallowing, Excessive gas, Gets full quickly at meals, Indigestion, Nausea, Rectal Pain and Vomiting. Female Genitourinary Present- Urgency. Not Present- Frequency, Nocturia, Painful Urination and Pelvic Pain. Musculoskeletal Present- Joint Pain (B/L HANDS AND KNEES), Joint Stiffness, Muscle Pain and Swelling of Extremities. Not Present- Back Pain and Muscle Weakness. Neurological Present- Decreased Memory. Not Present- Fainting, Headaches, Numbness, Seizures, Tingling, Tremor, Trouble walking and Weakness. Endocrine Present- Heat Intolerance. Not Present- Cold Intolerance, Excessive Hunger, Hair Changes, Hot flashes and New Diabetes.  BP 149/82  Pulse 83  Temp(Src) 98.1 F (36.7 C) (Oral)  Resp 20  Ht 5\' 2"  (1.575 m)  Wt 340 lb 12.8 oz (154.586 kg)  BMI 62.32 kg/m2  SpO2 94%  Vitals (Sonya Bynum CMA; 12/30/2013 11:22 AM) 12/30/2013 11:21 AM Weight: 342 lb Height: 62in Body Surface Area: 2.6 m Body Mass Index: 62.55 kg/m Pulse: 83 (Regular)  BP: 146/84 (Sitting, Left Arm, Standard)    Physical Exam Rebekah [name] M. Suraj Ramdass [name]; 01/04/2014 9:30 AM) General Mental Status-Alert. General Appearance-Consistent with stated age. Hydration-Well hydrated. Voice-Normal. Note: MORBIDLY OBESE, PEAR SHAPED   Head and Neck Head-normocephalic, atraumatic with no lesions or palpable masses. Trachea-midline. Thyroid Gland Characteristics - normal size and consistency.  Eye Eyeball - Bilateral-Extraocular movements intact. Sclera/Conjunctiva - Bilateral-No scleral icterus.  Chest and Lung Exam Chest and lung exam reveals -quiet, even and easy respiratory effort with no use of  accessory muscles, non-tender and normal tactile fremitus and on auscultation, normal breath sounds, no adventitious sounds and normal vocal resonance. Inspection Chest Wall - Normal. Back - normal.  Breast - Did not examine.  Cardiovascular Cardiovascular examination reveals -on palpation PMI is normal in location and amplitude, no palpable S3 or S4. Normal cardiac borders., normal heart sounds, regular rate and rhythm with no murmurs, carotid auscultation reveals no bruits and normal pedal pulses bilaterally.  Abdomen Inspection Inspection of the abdomen reveals - No Hernias. Skin - Scar - Right Lower Quadrant(SMALL VERTICAL RLQ PARAMEDIAN INCISION). Palpation/Percussion Palpation and Percussion of the abdomen reveal - Soft, Non Tender, No Rebound tenderness, No Rigidity (guarding) and No hepatosplenomegaly. Auscultation Auscultation of the abdomen reveals - Bowel sounds normal.  Rectal - Did not examine.  Peripheral Vascular Upper Extremity Inspection - Bilateral - Normal - No Clubbing, No Cyanosis, No Edema, Pulses Intact. Palpation - Pulses bilaterally normal. Lower Extremity Palpation - Pulses bilaterally normal.  Neurologic Neurologic evaluation reveals -alert and oriented x 3 with no impairment of recent or remote memory. Mental Status-Normal.  Musculoskeletal Normal Exam - Left-Upper Extremity Strength Normal and Lower Extremity Strength Normal. Normal Exam - Right-Upper Extremity Strength Normal and Lower Extremity Strength Normal. Note: SOME CREPITUS IN B/L KNEES; SOME DECREASED ROM IN B/L FINGERS; BRAWNY SKIN B/L LE   Lymphatic Head & Neck  General Head & Neck Lymphatics: Bilateral - Description - Normal. Axillary  General Axillary Region: Bilateral - Description - Normal. Tenderness - Non Tender. Femoral & Inguinal  Generalized Femoral & Inguinal Lymphatics: Bilateral - Description - Normal. Tenderness - Non Tender.    Assessment & Plan Rebekah [name]  M. Enzley Kitchens [name]; 01/04/2014 9:37 AM) MORBID OBESITY, UNSPECIFIED OBESITY TYPE (278.01  E66.01) Impression: We discussed her preoperative workup. we discussed the typical hospitalization and postop course. We had about a 25 minute discussion. she has a requested a foley overnight. She is also concerned about her volume status perioperatively and requested to be given lasix the day of surgery. I explained that her intra-operative fluid volume should not be  that great and was reluctant to give her lasix on day of surgery. I also explained that her oral intake will be severely diminshed posteroperatively so she will need some IVFs but we will keep an eye on her volume status. She was given her postop pain medicaiton rx today. All of her questions were asked and answered.  24 hours prior to surgery No alcoholic beverages Report fever greater than 100.5 or excessive nasal drainage suggesting infection Continue bariatric preop diet Do not eat or drink anything after midnight the night before surgery Do not take any medications except those instructed by the anesthesiologist  Morning of surgery Please arrive at the hospital at least 2 hours before your scheduled surgery time. No makeup, fingernail polish or jewelry Bring insurance cards with you Bring your CPAP mask if you use this Started OxyCODONE HCl 5MG /5ML, 5-10 Milliliter every four hours, as needed, 200 Milliliter, 12/30/2013, No Refill.  DYSLIPIDEMIA (272.4  E78.5) OSTEOARTHRITIS OF BOTH KNEES, UNSPECIFIED OSTEOARTHRITIS TYPE (715.96  M17.0) PREDIABETES (790.29  R73.09) Story: A1C 07/02/13 - 6.1 High blood pressure Gastroesophageal Reflux Disease  Leighton Ruff. Redmond Pulling, [name], FACS General, Bariatric, & Minimally Invasive Surgery [facility], [place]

## 2014-01-04 NOTE — Progress Notes (Signed)
Echocardiogram 2D Echocardiogram has been performed.  Rebekah [name] 01/04/2014, 4:38 PM

## 2014-01-04 NOTE — Anesthesia Postprocedure Evaluation (Signed)
  Anesthesia Post-op Note  Patient: Rebekah [name]  Procedure(s) Performed: Procedure(s): LAPAROSCOPIC GASTRIC SLEEVE RESECTION (N/A) UPPER GI ENDOSCOPY  Patient Location: PACU  Anesthesia Type:General  Level of Consciousness: awake and alert   Airway and Oxygen Therapy: Patient Spontanous Breathing and Patient connected to nasal cannula oxygen  Post-op Pain: mild  Post-op Assessment: Post-op Vital signs reviewed, PATIENT'S CARDIOVASCULAR STATUS UNSTABLE, Respiratory Function Stable, Patent Airway and No signs of Nausea or vomiting  Post-op Vital Signs: Reviewed and stable  Last Vitals:  Filed Vitals:   01/04/14 1415  BP:   Pulse: 134  Temp:   Resp: 16    Complications: No apparent anesthesia complications

## 2014-01-04 NOTE — Progress Notes (Signed)
S/P Laparoscopic Sleeve Gastrectomy for obesity, some mild atrial arhythmia during induction, NSR throughout case, now in PACU has AF .  Hemodynamically stable, alert and oriented.  No new pain, continues to have pain from arthritis in Right elbow.  Have given pain meds without benefit.  Called cardiology for consultation.

## 2014-01-04 NOTE — Transfer of Care (Signed)
Immediate Anesthesia Transfer of Care Note  Patient: Rebekah [name]  Procedure(s) Performed: Procedure(s): LAPAROSCOPIC GASTRIC SLEEVE RESECTION (N/A) UPPER GI ENDOSCOPY  Patient Location: PACU  Anesthesia Type:General  Level of Consciousness: awake, alert , oriented and patient cooperative  Airway & Oxygen Therapy: Patient Spontanous Breathing and Patient connected to face mask oxygen  Post-op Assessment: Report given to PACU RN, Post -op Vital signs reviewed and stable and Patient moving all extremities  Post vital signs: Reviewed and stable  Complications: No apparent anesthesia complications

## 2014-01-04 NOTE — Op Note (Signed)
01/04/2014 Rebekah [name] 06-02-1955 [id number]   PRE-OPERATIVE DIAGNOSIS:   Morbid obesity BMI 62.5 Prediabetes OA/Degenerative Joint Disease HTN GERD Hypothyroidism dyslipidemia   POST-OPERATIVE DIAGNOSIS:  same  PROCEDURE:  Procedure(s): LAPAROSCOPIC SLEEVE GASTRECTOMY UPPER GI ENDOSCOPY  SURGEON:  Surgeon(s): Gayland Curry, MD FACS  ASSISTANTS: Alphonsa Overall MD  ANESTHESIA:   general  DRAINS: none   BOUGIE: 54 fr ViSiGi  LOCAL MEDICATIONS USED:  MARCAINE     SPECIMEN:  Source of Specimen:  Greater curvature of stomach  DISPOSITION OF SPECIMEN:  PATHOLOGY  COUNTS:  YES  INDICATION FOR PROCEDURE: This is a very pleasant 58 year old morbidly obese WF who has had unsuccessful attempts for sustained weight loss. She presents today for a planned laparoscopic sleeve gastrectomy with upper endoscopy. We have discussed the risk and benefits of the procedure extensively preoperatively. Please see my separate notes.  PROCEDURE: After obtaining informed consent and receiving 5000 units of subcutaneous heparin, the patient was brought to the operating room at [facility] and placed supine on the operating room table. General endotracheal anesthesia was established. Sequential compression devices were placed. A Foley catheter was placed. A orogastric tube was placed. The patient's abdomen was prepped and draped in the usual standard surgical fashion. She received preoperative IV antibiotics. A surgical timeout was performed.  Access to the abdomen was achieved using a 5 mm 0 laparoscope thru a 5 mm trocar In the left upper Quadrant 2 fingerbreadths below the left subcostal margin using the Optiview technique. Pneumoperitoneum was smoothly established up to 15 mm of mercury. The laparoscope was advanced and the abdominal cavity was surveilled. There was a small umbilical fascial defect found on laparoscopy.  A 5 mm trocar was placed slightly above and to the left of the  umbilicus under direct visualization. The patient was then placed in reverse Trendelenburg. The [facility] liver retractor was placed under the left lobe of the liver through a 5 mm trocar incision site in the subxiphoid position. A 5 mm trocar was placed in the lateral right upper quadrant along with a 15 mm trocar in the mid right abdomen  All under direct visualization after local had been infiltrated.  The stomach was inspected. It was completely decompressed and the orogastric tube was removed. We identified the pylorus and measured 5 cm proximal to the pylorus and identified an area of where we would start taking down the short gastric vessels. Harmonic scalpel was used to take down the short gastric vessels along the greater curvature of the stomach. We were able to enter the lesser sac. We continued to march along the greater curvature of the stomach taking down the short gastrics. As we approached the gastrosplenic ligament we took care in this area not to injure the spleen. We were able to take down the entire gastrosplenic ligament. We then mobilized the fundus away from the left crus of diaphragm. There were a few thin posterior gastric avascular attachments which were taken down. This left the stomach completely mobilized. No vessels had been taken down along the lesser curvature of the stomach.  We then reidentified the pylorus. A 36Fr ViSiGi was then placed in the oropharynx and advanced down into the stomach and placed in the distal antrum and positioned along the lesser curvature. It was placed under suction which secured the 36Fr ViSiGi in place along the lesser curve. Then using the Covidien black 45 mm TRS stapler load,  I placed a stapler along the antrum approximately 5cm from the  pylorus. The stapler was angled so that there is ample room at the angularis incisura. I then fired the first staple load after inspecting it posteriorly to ensure adequate space both anteriorly and posteriorly. At  this point I still was not completely past the angularis so with another 8mm black TRS stapler load, I placed the stapler in position just inside the prior stapleline. We then rotated the stomach to insure that there was adequate anteriorly as well as posteriorly. The stapler was then fired. At this point I started using 60 mm purple TRS stapler load staple cartridges. The  stapler was then repositioned with a 60 mm purple TRS load and we continued to march up along the [place]. My assistant was holding traction along the greater curvature stomach along the cauterized short gastric vessels ensuring that the stomach was symmetrically retracted. Prior to each firing of the staple, we rotated the stomach to ensure that there is adequate stomach left.  As we approached the fundus, the last firing of the stapler was lateral to the esophageal fat pad. Although the staples on this fire had completely gone thru the last part of the stomach it had not completely cut it. Therefore 1 additional 60 purple TRS load was used to free the remaining stomach. The sleeve was inspected. There is no evidence of cork screw. The staple line appeared hemostatic. The CRNA inflated the ViSiGi to the green zone and the upper abdomen was flooded with saline. There were no bubbles. The sleeve was decompressed and the ViSiGi removed. My assistant scrubbed out and performed an upper endoscopy. The sleeve easily distended with air and the scope was easily advanced to the pylorus. There is no evidence of internal bleeding or cork screwing. There was no narrowing at the angularis. There is no evidence of bubbles. Please see his operative note for further details. The gastric sleeve was decompressed and the endoscope was removed. I tack one area of the distal short gastric tissue back to the staple line just inferior to the incisura with a single interrupted 2-0 vicryl suture. The greater curvature the stomach was grasped with a laparoscopic grasper  and removed from the 15 mm trocar site.  The liver retractor was removed. I then closed the 15 mm trocar site with 1 interrupted 0 Vicryl sutures through the fascia using the endoclose. The closure was viewed laparoscopically and it was airtight. Pneumoperitoneum was released. All trocar sites were closed with a 4-0 Monocryl in a subcuticular fashion followed by the application of Dermabond. The Foley catheter was removed. The patient was extubated and taken to the recovery room in stable condition. All needle, instrument, and sponge counts were correct x2. There are no immediate complications  (1) 45 black TRS, (1) 60 black TRS, (5) 60 purple TRS  PLAN OF CARE: Admit to inpatient   PATIENT DISPOSITION:  PACU - hemodynamically stable.   Delay start of Pharmacological VTE agent (>24hrs) due to surgical blood loss or risk of bleeding:  no  Leighton Ruff. Redmond Pulling, MD, FACS General, Bariatric, & Minimally Invasive Surgery [facility], [place]

## 2014-01-04 NOTE — Telephone Encounter (Signed)
Consent/preop orders updated

## 2014-01-05 ENCOUNTER — Inpatient Hospital Stay (HOSPITAL_COMMUNITY): Payer: BC Managed Care – PPO

## 2014-01-05 ENCOUNTER — Encounter (HOSPITAL_COMMUNITY): Payer: Self-pay | Admitting: General Surgery

## 2014-01-05 DIAGNOSIS — R0609 Other forms of dyspnea: Secondary | ICD-10-CM

## 2014-01-05 DIAGNOSIS — R0989 Other specified symptoms and signs involving the circulatory and respiratory systems: Secondary | ICD-10-CM

## 2014-01-05 DIAGNOSIS — Z6841 Body Mass Index (BMI) 40.0 and over, adult: Secondary | ICD-10-CM

## 2014-01-05 LAB — COMPREHENSIVE METABOLIC PANEL
ALBUMIN: 3.6 g/dL (ref 3.5–5.2)
ALT: 86 U/L — AB (ref 0–35)
ANION GAP: 10 (ref 5–15)
AST: 86 U/L — ABNORMAL HIGH (ref 0–37)
Alkaline Phosphatase: 145 U/L — ABNORMAL HIGH (ref 39–117)
BILIRUBIN TOTAL: 0.2 mg/dL — AB (ref 0.3–1.2)
BUN: 12 mg/dL (ref 6–23)
CHLORIDE: 98 meq/L (ref 96–112)
CO2: 29 mEq/L (ref 19–32)
Calcium: 9.2 mg/dL (ref 8.4–10.5)
Creatinine, Ser: 0.7 mg/dL (ref 0.50–1.10)
GFR calc Af Amer: >90 mL/min (ref >90)
GFR calc non Af Amer: >90 mL/min (ref >90)
Glucose, Bld: 155 mg/dL — ABNORMAL HIGH (ref 70–99)
Potassium: 5.1 mEq/L (ref 3.7–5.3)
Sodium: 137 mEq/L (ref 137–147)
Total Protein: 7.4 g/dL (ref 6.0–8.3)

## 2014-01-05 LAB — CBC WITH DIFFERENTIAL/PLATELET
Basophils Absolute: 0 10*3/uL (ref 0.0–0.1)
Basophils Relative: 0 % (ref 0–1)
Eosinophils Absolute: 0 10*3/uL (ref 0.0–0.7)
Eosinophils Relative: 0 % (ref 0–5)
HCT: 36 % (ref 36.0–46.0)
Hemoglobin: 10.7 g/dL — ABNORMAL LOW (ref 12.0–15.0)
LYMPHS ABS: 1.2 10*3/uL (ref 0.7–4.0)
Lymphocytes Relative: 12 % (ref 12–46)
MCH: 25.4 pg — ABNORMAL LOW (ref 26.0–34.0)
MCHC: 29.7 g/dL — ABNORMAL LOW (ref 30.0–36.0)
MCV: 85.3 fL (ref 78.0–100.0)
MONOS PCT: 6 % (ref 3–12)
Monocytes Absolute: 0.6 10*3/uL (ref 0.1–1.0)
NEUTROS PCT: 82 % — AB (ref 43–77)
Neutro Abs: 8.2 10*3/uL — ABNORMAL HIGH (ref 1.7–7.7)
Platelets: 174 10*3/uL (ref 150–400)
RBC: 4.22 MIL/uL (ref 3.87–5.11)
RDW: 15.8 % — ABNORMAL HIGH (ref 11.5–15.5)
WBC: 9.9 10*3/uL (ref 4.0–10.5)

## 2014-01-05 LAB — TSH: TSH: 1.1 u[IU]/mL (ref 0.350–4.500)

## 2014-01-05 LAB — TROPONIN I: Troponin I: <0.3 ng/mL (ref <0.30)

## 2014-01-05 LAB — HEMOGLOBIN AND HEMATOCRIT, BLOOD
HCT: 36.9 % (ref 36.0–46.0)
Hemoglobin: 11.1 g/dL — ABNORMAL LOW (ref 12.0–15.0)

## 2014-01-05 MED ORDER — UNJURY CHOCOLATE CLASSIC POWDER
2.0000 [oz_av] | Freq: Four times a day (QID) | ORAL | Status: DC
  Administered 2014-01-05 – 2014-01-06 (×2): 2 [oz_av] via ORAL

## 2014-01-05 MED ORDER — HYDROMORPHONE HCL 1 MG/ML IJ SOLN
0.5000 mg | INTRAMUSCULAR | Status: DC | PRN
  Administered 2014-01-05: 0.5 mg via INTRAVENOUS
  Filled 2014-01-05: qty 1

## 2014-01-05 MED ORDER — IOHEXOL 300 MG/ML  SOLN
50.0000 mL | Freq: Once | INTRAMUSCULAR | Status: AC | PRN
  Administered 2014-01-05: 50 mL via ORAL

## 2014-01-05 MED ORDER — AMLODIPINE BESYLATE 5 MG PO TABS: 5.0000 mg | ORAL_TABLET | Freq: Every day | ORAL | Status: DC

## 2014-01-05 MED ORDER — LEVOTHYROXINE SODIUM 50 MCG PO TABS
50.0000 ug | ORAL_TABLET | Freq: Every day | ORAL | Status: DC
  Administered 2014-01-06: 50 ug via ORAL
  Filled 2014-01-05 (×2): qty 1

## 2014-01-05 MED ORDER — ENALAPRILAT 1.25 MG/ML IV SOLN
1.2500 mg | Freq: Four times a day (QID) | INTRAVENOUS | Status: DC | PRN
  Filled 2014-01-05: qty 1

## 2014-01-05 MED ORDER — UNJURY CHICKEN SOUP POWDER: 2.0000 [oz_av] | Freq: Four times a day (QID) | ORAL | Status: DC

## 2014-01-05 MED ORDER — AMLODIPINE BESYLATE 10 MG PO TABS
20.0000 mg | ORAL_TABLET | Freq: Every day | ORAL | Status: DC
  Administered 2014-01-05 – 2014-01-06 (×2): 20 mg via ORAL
  Filled 2014-01-05 (×2): qty 2

## 2014-01-05 MED ORDER — ENALAPRILAT 1.25 MG/ML IV SOLN
1.2500 mg | Freq: Once | INTRAVENOUS | Status: AC
  Administered 2014-01-05: 1.25 mg via INTRAVENOUS
  Filled 2014-01-05 (×2): qty 1

## 2014-01-05 MED ORDER — IRBESARTAN 150 MG PO TABS
150.0000 mg | ORAL_TABLET | Freq: Every day | ORAL | Status: DC
  Administered 2014-01-05 – 2014-01-06 (×2): 150 mg via ORAL
  Filled 2014-01-05 (×3): qty 1

## 2014-01-05 MED ORDER — INFLUENZA VAC SPLIT QUAD 0.5 ML IM SUSY
0.5000 mL | PREFILLED_SYRINGE | INTRAMUSCULAR | Status: AC
  Administered 2014-01-06: 0.5 mL via INTRAMUSCULAR
  Filled 2014-01-05 (×2): qty 0.5

## 2014-01-05 MED ORDER — ENALAPRILAT 1.25 MG/ML IV SOLN
1.2500 mg | Freq: Four times a day (QID) | INTRAVENOUS | Status: DC
Start: 2014-01-05 — End: 2014-01-05
  Filled 2014-01-05 (×4): qty 1

## 2014-01-05 MED ORDER — UNJURY VANILLA POWDER: 2.0000 [oz_av] | Freq: Four times a day (QID) | ORAL | Status: DC

## 2014-01-05 NOTE — Care Management Note (Signed)
    Page 1 of 1   01/05/2014     1:47:01 PM CARE MANAGEMENT NOTE 01/05/2014  Patient:  [facility]   Account Number:  [id number]  Date Initiated:  01/05/2014  Documentation initiated by:  Sunday Spillers  Subjective/Objective Assessment:   58 yo female admitted s/p sleeve gastrectomy. PTA lived at home with spouse     Action/Plan:   Home when stable   Anticipated DC Date:  01/08/2014   Anticipated DC Plan:  [place]  CM consult      Choice offered to / List presented to:             Status of service:  Completed, signed off Medicare Important Message given?   (If response is "NO", the following Medicare IM given date fields will be blank) Date Medicare IM given:   Medicare IM given by:   Date Additional Medicare IM given:   Additional Medicare IM given by:    Discharge Disposition:  HOME/SELF CARE  Per UR Regulation:  Reviewed for med. necessity/level of care/duration of stay  If discussed at [place] of Stay Meetings, dates discussed:    Comments:

## 2014-01-05 NOTE — Progress Notes (Signed)
Patient alert and oriented, Post op day 1.  Provided support and encouragement.  Encouraged pulmonary toilet, ambulation and small sips of liquids when swallow study returns satisfactorily.  All questions answered.  Will continue to monitor.

## 2014-01-05 NOTE — Progress Notes (Signed)
1 Day Post-Op  Subjective: Converted to sinus. Had HTN issues. Minimal pain. No n/v. Ambulated. Got a little nauseous with morphine  Objective: Vital signs in last 24 hours: Temp:  [97.9 F (36.6 C)-98.8 F (37.1 C)] 98.7 F (37.1 C) (09/22 0400) Pulse Rate:  [65-137] 73 (09/22 0700) Resp:  [14-26] 15 (09/22 0700) BP: (89-201)/(37-102) 168/78 mmHg (09/22 0700) SpO2:  [87 %-100 %] 95 % (09/22 0700) Weight:  [343 lb 14.7 oz (156 kg)] 343 lb 14.7 oz (156 kg) (09/21 1550) Last BM Date: 01/02/14  Intake/Output from previous day: 09/21 0701 - 09/22 0700 In: 4102.8 [I.V.:4102.8] Out: 1290 [Urine:1270; Blood:20] Intake/Output this shift:    Looks good. Nontoxic cta b/l Sinus, reg Obese, soft, nt, nd. Incision c/d/i No edema  Lab Results:   Recent Labs  01/04/14 1308 01/05/14 0304  WBC  --  9.9  HGB 11.5* 10.7*  HCT 38.6 36.0  PLT  --  174   BMET  Recent Labs  01/05/14 0304  NA 137  K 5.1  CL 98  CO2 29  GLUCOSE 155*  BUN 12  CREATININE 0.70  CALCIUM 9.2   PT/INR No results found for this basename: LABPROT, INR,  in the last 72 hours ABG No results found for this basename: PHART, PCO2, PO2, HCO3,  in the last 72 hours  Studies/Results: No results found.  Anti-infectives: Anti-infectives   Start     Dose/Rate Route Frequency Ordered Stop   01/04/14 0725  cefOXitin (MEFOXIN) 2 g in dextrose 5 % 50 mL IVPB     2 g 100 mL/hr over 30 Minutes Intravenous On call to O.R. 01/04/14 0725 01/04/14 1051      Assessment/Plan: s/p Procedure(s): LAPAROSCOPIC GASTRIC SLEEVE RESECTION (N/A) UPPER GI ENDOSCOPY  CV - appreciate card assistance. D/c cardizem gtt. In sinus. IV vasotec prn for BP. Will reassume home BP meds after swallow this am. F/u cards rec GI- strict NPO until swallow results back. No tachycardia, no fever, nml wbc. No clinical signs of leak Renal - good uop. Cr ok. D/c foley Pulm - home O2 at night, continue; out of bed, ambulate, IS VTE  prophylaxis - hgb down some. Will continue lovenox. Repeat cbc later today  tx to floor  Leighton Ruff. Redmond Pulling, MD, FACS General, Bariatric, & Minimally Invasive Surgery [facility], [place]   LOS: 1 day    Gayland Curry 01/05/2014

## 2014-01-05 NOTE — Progress Notes (Signed)
Patient Name: Rebekah [name] Date of Encounter: 01/05/2014  Active Problems:   Morbid obesity   Length of Stay: 1  SUBJECTIVE  The patients feels well, she started to walk, and only complains of mild disconfort in her stomach, otherwise no chest pain, SOB or palpitations.   CURRENT MEDS . Chlorhexidine Gluconate Cloth  6 each Topical Q0600  . enalaprilat  1.25 mg Intravenous 4 times per day  . enoxaparin (LOVENOX) injection  40 mg Subcutaneous Q12H  . mupirocin ointment  1 application Nasal BID  . pantoprazole (PROTONIX) IV  40 mg Intravenous Q24H  . [START ON 01/06/2014] protein supplement  2 oz Oral QID   Or  . [START ON 01/06/2014] protein supplement  2 oz Oral QID   Or  . [START ON 01/06/2014] protein supplement  2 oz Oral QID   . sodium chloride Stopped (01/04/14 2100)  . dextrose 5 % and 0.45 % NaCl with KCl 20 mEq/L 125 mL/hr at 01/05/14 0132   OBJECTIVE  Filed Vitals:   01/05/14 0400 01/05/14 0500 01/05/14 0600 01/05/14 0700  BP: 176/75 201/78 174/79 168/78  Pulse: 69 65 77 73  Temp: 98.7 F (37.1 C)     TempSrc: Oral     Resp: 16 15 17 15   Height:      Weight:      SpO2: 97% 98% 99% 95%    Intake/Output Summary (Last 24 hours) at 01/05/14 0828 Last data filed at 01/05/14 0700  Gross per 24 hour  Intake 4102.84 ml  Output   1290 ml  Net 2812.84 ml   Filed Weights   01/04/14 0724 01/04/14 1550  Weight: 340 lb 12.8 oz (154.586 kg) 343 lb 14.7 oz (156 kg)    PHYSICAL EXAM  General: Pleasant, NAD. Neuro: Alert and oriented X 3. Moves all extremities spontaneously. Psych: Normal affect. HEENT:  Normal  Neck: Supple without bruits or JVD. Lungs:  Resp regular and unlabored, CTA. Heart: RRR no s3, s4, or murmurs. Abdomen: Soft, non-tender, non-distended, BS + x 4.  Extremities: No clubbing, cyanosis or edema. DP/PT/Radials 2+ and equal bilaterally.  Accessory Clinical Findings  CBC  Recent Labs  01/04/14 1308 01/05/14 0304  WBC  --  9.9    NEUTROABS  --  8.2*  HGB 11.5* 10.7*  HCT 38.6 36.0  MCV  --  85.3  PLT  --  301   Basic Metabolic Panel  Recent Labs  01/05/14 0304  NA 137  K 5.1  CL 98  CO2 29  GLUCOSE 155*  BUN 12  CREATININE 0.70  CALCIUM 9.2   Liver Function Tests  Recent Labs  01/05/14 0304  AST 86*  ALT 86*  ALKPHOS 145*  BILITOT 0.2*  PROT 7.4  ALBUMIN 3.6   No results found for this basename: LIPASE, AMYLASE,  in the last 72 hours Cardiac Enzymes  Recent Labs  01/04/14 1520 01/04/14 2109 01/05/14 0304  TROPONINI <0.30 <0.30 <0.30    Recent Labs  01/04/14 2100  TSH 1.100    Ech0 01/04/2014 Left ventricle: The cavity size was normal. There was mild focal basal hypertrophy of the septum. Systolic function was vigorous. The estimated ejection fraction was in the range of 65% to 70%. Wall motion was normal; there were no regional wall motion abnormalities. Features are consistent with a pseudonormal left ventricular filling pattern, with concomitant abnormal relaxation and increased filling pressure (grade 2 diastolic dysfunction). - Right ventricle: The cavity size was moderately dilated.  Wall thickness was normal. - Right atrium: The atrium was moderately dilated. - Tricuspid valve: There was moderate regurgitation. - Pulmonary arteries: Systolic pressure was mildly increased. PA peak pressure: 46 mm Hg (S).  ECG: 01/04/2014, a-fib with RVR, no ST-T wave changes  TELE: SR, 1 episode of ns VT, 6 beats    ASSESSMENT AND PLAN  Super morbidly obese 58 year old Caucasian female with past medical history significant for hypertension, morbid obesity, prediabetes, hyperlipidemia and chronic hypoventilation syndrome presented with postop a-fib with RVR after lap sleeve gastrectomy  1. Post-op a-fib with RVR, spontaneously cardioverted to SR - TSH normal  - troponin negative x 3  - Diltiazem gtt discontinued   - CHADS-Vasc score 2 (age, female), however if this is purely  postop a-fib and no recurrence, may not need systemic anticoagulation once converted, for now no anticoagulation   2. Morbid obesity s/p laparoscopic sleeve gastrectomy 9/21   3. Hypertension - and pulmonary hypertension, we would recommend to restart home dose of amlodipine 20 mg po daily, if tolerated we could add Imdur 30 mg po daily.  4. Liver enzyme elevation - most probably sec to fatty liver  4. Prediabetes   5. Hyperlipidemia   6. chronic hypoventilation syndrome on home O2  - 3L at night, PRN 5 L during day depend on activity  Signed, Dorothy Spark MD, [facility] 01/05/2014

## 2014-01-06 ENCOUNTER — Ambulatory Visit (INDEPENDENT_AMBULATORY_CARE_PROVIDER_SITE_OTHER): Payer: BC Managed Care – PPO | Admitting: General Surgery

## 2014-01-06 DIAGNOSIS — Z9884 Bariatric surgery status: Secondary | ICD-10-CM

## 2014-01-06 DIAGNOSIS — I9789 Other postprocedural complications and disorders of the circulatory system, not elsewhere classified: Secondary | ICD-10-CM

## 2014-01-06 DIAGNOSIS — I4891 Unspecified atrial fibrillation: Secondary | ICD-10-CM | POA: Diagnosis not present

## 2014-01-06 DIAGNOSIS — M1990 Unspecified osteoarthritis, unspecified site: Secondary | ICD-10-CM | POA: Diagnosis present

## 2014-01-06 DIAGNOSIS — R7303 Prediabetes: Secondary | ICD-10-CM | POA: Diagnosis present

## 2014-01-06 DIAGNOSIS — E785 Hyperlipidemia, unspecified: Secondary | ICD-10-CM | POA: Diagnosis present

## 2014-01-06 LAB — CBC WITH DIFFERENTIAL/PLATELET
BASOS ABS: 0 10*3/uL (ref 0.0–0.1)
Basophils Relative: 0 % (ref 0–1)
EOS ABS: 0.1 10*3/uL (ref 0.0–0.7)
EOS PCT: 1 % (ref 0–5)
HCT: 36.1 % (ref 36.0–46.0)
Hemoglobin: 10.8 g/dL — ABNORMAL LOW (ref 12.0–15.0)
Lymphocytes Relative: 23 % (ref 12–46)
Lymphs Abs: 1.8 10*3/uL (ref 0.7–4.0)
MCH: 25.8 pg — AB (ref 26.0–34.0)
MCHC: 29.9 g/dL — ABNORMAL LOW (ref 30.0–36.0)
MCV: 86.2 fL (ref 78.0–100.0)
Monocytes Absolute: 1 10*3/uL (ref 0.1–1.0)
Monocytes Relative: 12 % (ref 3–12)
Neutro Abs: 5.1 10*3/uL (ref 1.7–7.7)
Neutrophils Relative %: 64 % (ref 43–77)
Platelets: 149 10*3/uL — ABNORMAL LOW (ref 150–400)
RBC: 4.19 MIL/uL (ref 3.87–5.11)
RDW: 16.1 % — AB (ref 11.5–15.5)
WBC: 8 10*3/uL (ref 4.0–10.5)

## 2014-01-06 MED ORDER — ONDANSETRON HCL 4 MG PO TABS: 4.0000 mg | ORAL_TABLET | Freq: Three times a day (TID) | ORAL | Status: DC | PRN

## 2014-01-06 MED ORDER — VALSARTAN 320 MG PO TABS: 160.0000 mg | ORAL_TABLET | Freq: Every morning | ORAL | Status: DC

## 2014-01-06 NOTE — Progress Notes (Signed)
Patient alert and oriented, pain is controlled. Patient is tolerating fluids, advanced to protein shake today, tolerating well.  Reviewed Gastric sleeve discharge instructions with patient and patient is able to articulate understanding.  Provided information on BELT program, Support Group and [facility] outpatient pharmacy. All questions answered, will continue to monitor.

## 2014-01-06 NOTE — Progress Notes (Signed)
2 Days Post-Op  Subjective: Doing well. Tolerating shakes. No regurg. +flatus. Pain controlled. ambulating  Objective: Vital signs in last 24 hours: Temp:  [98.4 F (36.9 C)-99.3 F (37.4 C)] 99.1 F (37.3 C) (09/23 0600) Pulse Rate:  [66-71] 70 (09/23 0600) Resp:  [18-21] 18 (09/23 0600) BP: (129-169)/(56-81) 129/56 mmHg (09/23 0600) SpO2:  [96 %-100 %] 97 % (09/23 0600) Last BM Date: 01/03/14  Intake/Output from previous day: 09/22 0701 - 09/23 0700 In: 2000 [P.O.:120; I.V.:1880] Out: 1600 [Urine:1600] Intake/Output this shift: Total I/O In: -  Out: 650 [Urine:650]  Alert, sitting in chair cta b/l Reg Obese, soft, expected incisional TTP, incision c/d/i +scds, no edema  Lab Results:   Recent Labs  01/05/14 0304 01/05/14 1540 01/06/14 0433  WBC 9.9  --  8.0  HGB 10.7* 11.1* 10.8*  HCT 36.0 36.9 36.1  PLT 174  --  149*   BMET  Recent Labs  01/05/14 0304  NA 137  K 5.1  CL 98  CO2 29  GLUCOSE 155*  BUN 12  CREATININE 0.70  CALCIUM 9.2   PT/INR No results found for this basename: LABPROT, INR,  in the last 72 hours ABG No results found for this basename: PHART, PCO2, PO2, HCO3,  in the last 72 hours  Studies/Results: Dg Ugi W/water Sol Cm  01/05/2014   CLINICAL DATA:  Status post gastric sleeve  EXAM: WATER SOLUBLE UPPER GI SERIES  TECHNIQUE: Single-column upper GI series was performed using water soluble contrast.  CONTRAST:  79mL OMNIPAQUE IOHEXOL 300 MG/ML  SOLN  COMPARISON:  None.  FLUOROSCOPY TIME:  53 seconds  FINDINGS: The esophagogastric junction is patent. Postoperative changes involving the stomach are noted compatible with gastric sleeve surgery. No extravasation of contrast material identified to suggest leak. The proximal small bowel loops appear normal.  IMPRESSION: 1. No complications status post gastric sleeve surgery.   Electronically Signed   By: Kerby Moors M.D.   On: 01/05/2014 11:07    Anti-infectives: Anti-infectives   Start      Dose/Rate Route Frequency Ordered Stop   01/04/14 0725  cefOXitin (MEFOXIN) 2 g in dextrose 5 % 50 mL IVPB     2 g 100 mL/hr over 30 Minutes Intravenous On call to O.R. 01/04/14 0725 01/04/14 1051      Assessment/Plan: s/p Procedure(s): LAPAROSCOPIC GASTRIC SLEEVE RESECTION (N/A) UPPER GI ENDOSCOPY  Doing well. No fever, tachycardia, wbc - no clinical signs of leak Probable dc later this am if cont to tolerate shakes.  BP - will send pt home on norvasc and 1/2 dose of her diovan. Will hold scheduled lasix for now given low PO that pt will be taking in over next week. Instructed pt to check BP daily. If increasing, resume full dose of diovan. If notice fluid retention, can take 20 of lasix. Will see if cards has other recs.   Discussed dc instructions. Discussed importance of ambulation  Rebekah Ruff. Redmond Pulling, MD, FACS General, Bariatric, & Minimally Invasive Surgery [facility], [place]   LOS: 2 days    Rebekah [name] 01/06/2014

## 2014-01-06 NOTE — Discharge Summary (Signed)
Physician Discharge Summary  Rebekah [name] VOJ:[id number] DOB: [birth year] DOA: 01/04/2014  PCP: Terald Sleeper, PA-C  Admit date: 01/04/2014 Discharge date: 01/06/2014  Recommendations for Outpatient Follow-up:   Follow-up Information   Follow up with Gayland Curry, MD On 01/27/2014. (10:15 AM  (arrive 10 AM) , For wound re-check)    Specialty:  General Surgery   Contact information:   [place] [address] [phone]       Follow up with Terald Sleeper, PA-C. Schedule an appointment as soon as possible for a visit in 2 weeks.   Specialty:  General Practice   Contact information:   [address] [place] [address] [phone]       Follow up with Dorothy Spark, MD. Schedule an appointment as soon as possible for a visit in 2 weeks. (for HR/afib recheck)    Specialty:  Cardiology   Contact information:   [place] [address] [phone]      Discharge Diagnoses:  Active Problems:   HYPERTENSION   G E R D   Dyspnea on exertion   Morbid obesity with BMI of 60.0-69.9, adult   Prediabetes   Dyslipidemia   OA (osteoarthritis)   S/P laparoscopic sleeve gastrectomy 01/04/14   Postoperative atrial fibrillation - resolved   Surgical Procedure: Laparoscopic Sleeve Gastrectomy, upper endoscopy  Discharge Condition: Good Disposition: Home  Diet recommendation: Postoperative sleeve gastrectomy diet (liquids only)  Filed Weights   01/04/14 0724 01/04/14 1550  Weight: 340 lb 12.8 oz (154.586 kg) 343 lb 14.7 oz (156 kg)     Hospital Course:  The patient was admitted for a planned laparoscopic sleeve gastrectomy. Please see operative note. Preoperatively the patient was given 5000 units of subcutaneous heparin for DVT prophylaxis. Postoperative prophylactic Lovenox dosing was started on the morning of postoperative day 1. In recovery the pt went into Afib with RVR- she was asymptomatic. Cardiology was  consulted. She was placed in stepdown for cardiac monitoring and cardizem drip. She spontaneously converted to sinus overnight and the drip was stopped and was transferred to the floor.  The patient underwent an upper GI on postoperative day 1 which demonstrated no extravasation of contrast and emptying of the contrast into the small bowel. The patient was started on ice chips and water which they tolerated. On postoperative day 2 The patient's diet was advanced to protein shakes which they also tolerated. The patient was ambulating without difficulty. Their vital signs are stable without fever or tachycardia. Their hemoglobin had remained stable. The small bump in LFTs is attributed to liver retraction during surgery. The patient had received discharge instructions and counseling. They were deemed stable for discharge. I did hold her lasix on discharge given her low oral intake anticipated over the next week. She was instructed to resume lasix if she noticed LE edema.    Discharge Instructions      Discharge Instructions   Discharge instructions    Complete by:  As directed   Monitor your blood pressure daily. If you notice your blood pressure increasing and consistently running high (>160/90) then resume diovan. I would recommend taking Lasix on a as needed basis for now given your oral intake will be low over the next week. Monitor for fluid in your legs - if present recommend taking 20mg  lasix. Call your PCP or me with questions     Increase activity slowly    Complete by:  As  directed             Medication List    STOP taking these medications       furosemide 40 MG tablet  Commonly known as:  LASIX     naproxen 500 MG tablet  Commonly known as:  NAPROSYN     potassium chloride SA 20 MEQ tablet  Commonly known as:  K-DUR,KLOR-CON      TAKE these medications       acetaminophen-codeine 300-30 MG per tablet  Commonly known as:  TYLENOL #3  Take 1-2 tablets by mouth every 6  (six) hours as needed for severe pain.     ADVAIR HFA IN  Inhale 2 puffs into the lungs 2 (two) times daily as needed.     albuterol 108 (90 BASE) MCG/ACT inhaler  Commonly known as:  PROVENTIL HFA;VENTOLIN HFA  Inhale 2 puffs into the lungs every 6 (six) hours as needed for wheezing.     amLODipine 10 MG tablet  Commonly known as:  NORVASC  Take 10 mg by mouth every morning.     CALCIUM & VIT D3 BONE HEALTH PO  Take by mouth.     cetirizine 10 MG tablet  Commonly known as:  ZYRTEC  Take 10 mg by mouth at bedtime.     EPIPEN 0.3 mg/0.3 mL Soaj injection  Generic drug:  EPINEPHrine  Inject 0.3 mg into the muscle once.     escitalopram 20 MG tablet  Commonly known as:  LEXAPRO  Take 20 mg by mouth every morning.     HYDROcodone-acetaminophen 10-325 MG per tablet  Commonly known as:  NORCO  Take 1 tablet by mouth every 6 (six) hours as needed for moderate pain.     levothyroxine 50 MCG tablet  Commonly known as:  SYNTHROID, LEVOTHROID  Take 50 mcg by mouth daily before breakfast.     multivitamin-iron-minerals-folic acid chewable tablet  Chew 1 tablet by mouth daily.     ondansetron 4 MG tablet  Commonly known as:  ZOFRAN  Take 1 tablet (4 mg total) by mouth every 8 (eight) hours as needed for nausea or vomiting.     OXYGEN  Inhale 5 L/min into the lungs continuous as needed (when active). 3 liters nightly     PREVACID 30 MG capsule  Generic drug:  lansoprazole  Take 30 mg by mouth every morning.     solifenacin 10 MG tablet  Commonly known as:  VESICARE  Take 10 mg by mouth every morning.     Trospium Chloride 60 MG Cp24  Take 1 capsule by mouth every morning.     valsartan 320 MG tablet  Commonly known as:  DIOVAN  Take 0.5 tablets (160 mg total) by mouth every morning.       Follow-up Information   Follow up with Gayland Curry, MD On 01/27/2014. (10:15 AM  (arrive 10 AM) , For wound re-check)    Specialty:  General Surgery   Contact information:    [place] [address] [phone]       Follow up with Terald Sleeper, PA-C. Schedule an appointment as soon as possible for a visit in 2 weeks.   Specialty:  General Practice   Contact information:   [address] [place] [address] [phone]       Follow up with Dorothy Spark, MD. Schedule an appointment as soon as possible for a visit in 2 weeks. (for HR/afib  recheck)    Specialty:  Cardiology   Contact information:   [place] [address] [phone]        The results of significant diagnostics from this hospitalization (including imaging, microbiology, ancillary and laboratory) are listed below for reference.    Significant Diagnostic Studies: Dg Ugi W/water Sol Cm  01/05/2014   CLINICAL DATA:  Status post gastric sleeve  EXAM: WATER SOLUBLE UPPER GI SERIES  TECHNIQUE: Single-column upper GI series was performed using water soluble contrast.  CONTRAST:  71mL OMNIPAQUE IOHEXOL 300 MG/ML  SOLN  COMPARISON:  None.  FLUOROSCOPY TIME:  53 seconds  FINDINGS: The esophagogastric junction is patent. Postoperative changes involving the stomach are noted compatible with gastric sleeve surgery. No extravasation of contrast material identified to suggest leak. The proximal small bowel loops appear normal.  IMPRESSION: 1. No complications status post gastric sleeve surgery.   Electronically Signed   By: Kerby Moors M.D.   On: 01/05/2014 11:07    Labs: Basic Metabolic Panel:  Recent Labs Lab 01/05/14 0304  NA 137  K 5.1  CL 98  CO2 29  GLUCOSE 155*  BUN 12  CREATININE 0.70  CALCIUM 9.2   Liver Function Tests:  Recent Labs Lab 01/05/14 0304  AST 86*  ALT 86*  ALKPHOS 145*  BILITOT 0.2*  PROT 7.4  ALBUMIN 3.6    CBC:  Recent Labs Lab 01/04/14 1308 01/05/14 0304 01/05/14 1540 01/06/14 0433  WBC  --  9.9  --  8.0  NEUTROABS  --  8.2*  --  5.1  HGB 11.5* 10.7* 11.1* 10.8*  HCT 38.6 36.0  36.9 36.1  MCV  --  85.3  --  86.2  PLT  --  174  --  149*    CBG:  Recent Labs Lab 01/04/14 1400  GLUCAP 154*    Active Problems:   HYPERTENSION   G E R D   Dyspnea on exertion   Morbid obesity with BMI of 60.0-69.9, adult   Prediabetes   Dyslipidemia   OA (osteoarthritis)   S/P laparoscopic sleeve gastrectomy 01/04/14   Postoperative atrial fibrillation - resolved   Time coordinating discharge: 15 minutes  Signed:  Gayland Curry, MD [facility], [place] 01/06/2014, 3:47 PM

## 2014-01-06 NOTE — Discharge Instructions (Signed)

## 2014-01-06 NOTE — Progress Notes (Signed)
Discharge instructions given to patient and husband at bedside.

## 2014-01-06 NOTE — Progress Notes (Signed)
Nutrition Education Note  Received consult for diet education per DROP protocol.   Discussed 2 week post op diet with pt. Emphasized that liquids must be non carbonated, non caffeinated, and sugar free. Fluid goals discussed. Pt to follow up with outpatient bariatric RD for further diet progression after 2 weeks. Multivitamins and minerals also reviewed. Teach back method used, pt expressed understanding, expect good compliance. Had questions about sugar free popsicles which were answered.    Diet: First 2 Weeks  You will see the nutritionist about two (2) weeks after your surgery. The nutritionist will increase the types of foods you can eat if you are handling liquids well:  If you have severe vomiting or nausea and cannot handle clear liquids lasting longer than 1 day, call your surgeon  Protein Shake  Drink at least 2 ounces of shake 5-6 times per day  Each serving of protein shakes (usually 8 - 12 ounces) should have a minimum of:  15 grams of protein  And no more than 5 grams of carbohydrate  Goal for protein each day:  Men = 80 grams per day  Women = 60 grams per day  Protein powder may be added to fluids such as non-fat milk or Lactaid milk or Soy milk (limit to 35 grams added protein powder per serving)   Hydration  Slowly increase the amount of water and other clear liquids as tolerated (See Acceptable Fluids)  Slowly increase the amount of protein shake as tolerated  Sip fluids slowly and throughout the day  May use sugar substitutes in small amounts (no more than 6 - 8 packets per day; i.e. Splenda)   Fluid Goal  The first goal is to drink at least 8 ounces of protein shake/drink per day (or as directed by the nutritionist); some examples of protein shakes are [employer], [employer], EAS Edge HP, and Unjury. See handout from pre-op Bariatric Education Class:  Slowly increase the amount of protein shake you drink as tolerated  You may find it easier to slowly sip  shakes throughout the day  It is important to get your proteins in first  Your fluid goal is to drink 64 - 100 ounces of fluid daily  It may take a few weeks to build up to this  32 oz (or more) should be clear liquids  And  32 oz (or more) should be full liquids (see below for examples)  Liquids should not contain sugar, caffeine, or carbonation   Clear Liquids:  Water or Sugar-free flavored water (i.e. Fruit H2O, Propel)  Decaffeinated coffee or tea (sugar-free)  Crystal Lite, Wyler's Lite, Minute Maid Lite  Sugar-free Jell-O  Bouillon or broth  Sugar-free Popsicle: *Less than 20 calories each; Limit 1 per day   Full Liquids:  Protein Shakes/Drinks + 2 choices per day of other full liquids  Full liquids must be:  No More Than 12 grams of Carbs per serving  No More Than 3 grams of Fat per serving  Strained low-fat cream soup  Non-Fat milk  Fat-free Lactaid Milk  Sugar-free yogurt (Dannon Lite & Fit, [place] yogurt)     [place] MS, RD, [place] [phone] Pager [phone] Weekend/After Hours Pager

## 2014-01-07 ENCOUNTER — Telehealth (HOSPITAL_COMMUNITY): Payer: Self-pay

## 2014-01-07 NOTE — Telephone Encounter (Signed)
Made discharge phone call to patient per DROP protocol. Asking the following questions.    1. Do you have someone to care for you now that you are home?  yes 2. Are you having pain now that is not relieved by your pain medication?  no 3. Are you able to drink the recommended daily amount of fluids (48 ounces minimum/day) and protein (60-80 grams/day) as prescribed by the dietitian or nutritional counselor?  Yes, working on it 4. Are you taking the vitamins and minerals as prescribed?  i am but they taste bad... 5. Do you have the "on call" number to contact your surgeon if you have a problem or question?  yes 6. Are your incisions free of redness, swelling or drainage? (If steri strips, address that these can fall off, shower as tolerated) yes 7. Have your bowels moved since your surgery?  If not, are you passing gas?  No, yes 8. Are you up and walking 3-4 times per day?  yes    1. Do you have an appointment made to see your surgeon in the next month?   yes 2. Were you provided your discharge medications before your surgery or before you were discharged from the hospital and are you taking them without problem?  yes 3. Were you provided phone numbers to the clinic/surgeon's office?  yes 4. Did you watch the patient education video module in the (clinic, surgeon's office, etc.) before your surgery? yes 5. Do you have a discharge checklist that was provided to you in the hospital to reference with instructions on how to take care of yourself after surgery?  yes 6. Did you see a dietitian or nutritional counselor while you were in the hospital?  yes 7. Do you have an appointment to see a dietitian or nutritional counselor in the next month?  yes

## 2014-01-14 ENCOUNTER — Telehealth (INDEPENDENT_AMBULATORY_CARE_PROVIDER_SITE_OTHER): Payer: Self-pay

## 2014-01-14 NOTE — Telephone Encounter (Signed)
Pt called stating she has had low grade temps for the past 10 days since surgery. Temp between 99.9 to 100.9. No other symptoms. No nausea. No drainage. No cough or sob. Voiding well with out pain or difficulty. BMs ok. No abd pain. Tolerating liquid diet. Pt will take tylenol and increase fluids. Pt states she was instructed at d/c to call if temp of 101 or higher. Pt states temp has not been over 100.9 but she wanted Dr Redmond Pulling to be aware of temps. Noted in allscripts also.

## 2014-01-14 NOTE — Telephone Encounter (Signed)
pls call pt back. Sounds ok AS long as no other symptoms - like abd or upper back or shoulder pain and tolerating liquids without regurgitation. Encourage incentive spirometry and ambulation

## 2014-01-18 NOTE — Telephone Encounter (Signed)
Pt is doing much better today.  Tolerating liquids and no pain.  She is using her home spirometer and is up moving around.  Still having low grade fevers (99.0 - 100F) which are controlled with Tylenol.  Advised her to call us with any changes.  She has a f/u appt with Dr. Redmond Pulling on 01/27/14.

## 2014-01-18 NOTE — Telephone Encounter (Signed)
Rebekah [name] has attempted to reach pt since 10-1. She will try to reach pt re: this again today.

## 2014-01-19 ENCOUNTER — Encounter: Payer: BC Managed Care – PPO | Attending: General Surgery

## 2014-01-19 DIAGNOSIS — Z6841 Body Mass Index (BMI) 40.0 and over, adult: Secondary | ICD-10-CM | POA: Diagnosis not present

## 2014-01-19 DIAGNOSIS — Z713 Dietary counseling and surveillance: Secondary | ICD-10-CM | POA: Insufficient documentation

## 2014-01-19 NOTE — Patient Instructions (Signed)
Patient to follow Phase 3A-Soft, High Protein Diet and follow-up at [facility] in 6 weeks for 2 months post-op nutrition visit for diet advancement. 

## 2014-01-19 NOTE — Progress Notes (Signed)
Bariatric Class:  Appt start time: 1530 end time:  1630.  2 Week Post-Operative Nutrition Class  Patient was seen on 01/19/14 for Post-[facility]ative Nutrition education at [facility].   Surgery date: 01/04/14 Surgery type: RYGB Start weight at [facility]: 367 lbs on 08/19/13 Weight today: 330.5 lbs Weight change: 27 lbs  TANITA  BODY COMP RESULTS  11/30/13 01/19/14   BMI (kg/m^2) 67.5 60.4   Fat Mass (lbs) 196 160.0   Fat Free Mass (lbs) 161.5 170.5   Total Body Water (lbs) 118 125.0    The following the learning objectives were met by the patient during this course:  Identifies Phase 3A (Soft, High Proteins) Dietary Goals and will begin from 2 weeks post-operatively to 2 months post-operatively  Identifies appropriate sources of fluids and proteins   States protein recommendations and appropriate sources post-operatively  Identifies the need for appropriate texture modifications, mastication, and bite sizes when consuming solids  Identifies appropriate multivitamin and calcium sources post-operatively  Describes the need for physical activity post-operatively and will follow MD recommendations  States when to call healthcare provider regarding medication questions or post-operative complications  Handouts given during class include:  Phase 3A: Soft, High Protein Diet Handout  Follow-Up Plan: Patient will follow-up at [facility] in 6 weeks for 2 month post-op nutrition visit for diet advancement per MD.   

## 2014-01-26 ENCOUNTER — Ambulatory Visit (INDEPENDENT_AMBULATORY_CARE_PROVIDER_SITE_OTHER): Payer: BC Managed Care – PPO | Admitting: Cardiology

## 2014-01-26 ENCOUNTER — Encounter: Payer: Self-pay | Admitting: Cardiology

## 2014-01-26 VITALS — BP 132/80 | HR 91 | Ht 62.0 in | Wt 328.0 lb

## 2014-01-26 DIAGNOSIS — I4891 Unspecified atrial fibrillation: Secondary | ICD-10-CM

## 2014-01-26 DIAGNOSIS — I9789 Other postprocedural complications and disorders of the circulatory system, not elsewhere classified: Secondary | ICD-10-CM

## 2014-01-26 NOTE — Patient Instructions (Addendum)
No changes have been made today in your current medication or treatment plan.  Lyda Jester, PA-C recommends that you schedule a follow-up appointment in 1 month with Dr Debara Pickett.

## 2014-01-26 NOTE — Progress Notes (Signed)
Patient ID: Rebekah [name], female   DOB: [birth year], 58 y.o.   MRN: [id number]    01/26/2014 Rebekah [name]   1955-10-04  [id number]  Primary Physicia Rebekah Sleeper, PA-C Primary Cardiologist: Dr. Debara Pickett  HPI:  The patient is a morbidly obese 58 year old Caucasian female with a past medical history significant for hypertension, morbid obesity, prediabetes, hyperlipidemia and chronic hypoventilation syndrome who underwent lap sleeve gastrectomy at [facility] in September 2015. Post-operatively, she developed atrial fibrillation with a rapid ventricular response. [facility] was consulted to evaluate. She was placed on an IV Cardizem drip and spontaneously converted back to normal sinus rhythm. TSH was within normal limits. Cardiac enzymes were negative x3. 2-D echocardiogram revealed normal systolic function with an estimated ejection fraction 65-70%. Grade 2 diastolic dysfunction was noted. There were no significant valvular abnormalities and the left atrium was normal size. Her CHADS VASC score was calculated at 2 (HTN and female sex). However, her atrial fibrillation was felt to be strictly postoperative and it was felt she would not need long-term oral anticoagulation unless she had further recurrence in the future. Once stable, she was discharged home. She was not discharged home on any rate control agents.   She presents back to clinic today for f/u. She denies any symptoms of atrial fibrillation: no palpitations, chest pain, dyspnea, dizziness, syncope/near syncope. Also denies orthopnea, PND and LEE.   Her EKG today demonstrates NSR. HR 91 bpm.   Current Outpatient Prescriptions  Medication Sig Dispense Refill  . amLODipine (NORVASC) 10 MG tablet Take 10 mg by mouth every morning.       . cetirizine (ZYRTEC) 10 MG tablet Take 10 mg by mouth at bedtime.       Marland Kitchen EPINEPHrine (EPIPEN) 0.3 mg/0.3 mL DEVI Inject 0.3 mg into the muscle once.      . escitalopram (LEXAPRO) 20 MG tablet Take 20 mg by mouth  every morning.       Marland Kitchen HYDROcodone-acetaminophen (NORCO) 10-325 MG per tablet Take 0.5 tablets by mouth as needed for moderate pain.       Marland Kitchen lansoprazole (PREVACID) 30 MG capsule Take 30 mg by mouth every morning.       Marland Kitchen levothyroxine (SYNTHROID, LEVOTHROID) 50 MCG tablet Take 50 mcg by mouth daily before breakfast.      . Multiple Minerals-Vitamins (CALCIUM & VIT D3 BONE HEALTH PO) Take by mouth.      . ondansetron (ZOFRAN) 4 MG tablet Take 1 tablet (4 mg total) by mouth every 8 (eight) hours as needed for nausea or vomiting.  20 tablet  0  . OVER THE COUNTER MEDICATION Journey 3+3 Multivitamin, take 1 tablet daily.      . OXYGEN Inhale 5 L/min into the lungs continuous as needed (when active). 3 liters nightly      . solifenacin (VESICARE) 10 MG tablet Take 10 mg by mouth every morning.      . valsartan (DIOVAN) 320 MG tablet Take 0.5 tablets (160 mg total) by mouth every morning.  1 tablet  0  . furosemide (LASIX) 40 MG tablet Take 1 tablet by mouth daily.      Marland Kitchen KLOR-CON M20 20 MEQ tablet Take 1 tablet by mouth daily.      . prednisoLONE acetate (PRED FORTE) 1 % ophthalmic suspension Place 1 drop into the left eye as needed.      Marland Kitchen PROLENSA 0.07 % SOLN Place 1 drop into the left eye daily.      Marland Kitchen  VIGAMOX 0.5 % ophthalmic solution Place 1 drop into the left eye 4 (four) times daily.       No current facility-administered medications for this visit.    Allergies  Allergen Reactions  . Mucinex [Guaifenesin Er] Anaphylaxis  . Propoxyphene Hcl     REACTION: hallucinate  . Sulfonamide Derivatives     REACTION: rash  . Hydromet [Hydrocodone-Homatropine] Hives and Rash    History   Social History  . Marital Status: Married    Spouse Name: N/A    Number of Children: 2  . Years of Education: N/A   Occupational History  . special education teacher Other    [place]  . retired Marine scientist - L&D @ [employer]  . Smoking status: Never Smoker     . Smokeless tobacco: Not on file  . Alcohol Use: No  . Drug Use: No  . Sexual Activity: Not on file   Other Topics Concern  . Not on file   Social History Narrative  . No narrative on file     Review of Systems: General: negative for chills, fever, night sweats or weight changes.  Cardiovascular: negative for chest pain, dyspnea on exertion, edema, orthopnea, palpitations, paroxysmal nocturnal dyspnea or shortness of breath Dermatological: negative for rash Respiratory: negative for cough or wheezing Urologic: negative for hematuria Abdominal: negative for nausea, vomiting, diarrhea, bright red blood per rectum, melena, or hematemesis Neurologic: negative for visual changes, syncope, or dizziness All other systems reviewed and are otherwise negative except as noted above.    Blood pressure 132/80, pulse 91, height 5\' 2"  (1.575 m), weight 328 lb (148.78 kg).  General appearance: alert, cooperative, no distress and morbidly obese Neck: no carotid bruit and no JVD Lungs: clear to auscultation bilaterally Heart: regular rate and rhythm and 2/6 SM heard throughout percordium but best at RUSB Extremities: no LEE Pulses: 2+ and symmetric Skin: warm and dry Neurologic: Grossly normal  EKG NSR 91 bpm  ASSESSMENT AND PLAN:   1. Pos-operative Afib: No further recurrence. EKG today demonstrates NSR. No indication for anticoagulation unless recurrence.   2. HTN: Well controlled at 132/80. Continue amlodipine and valsartan.  PLAN  No change in medical therapy. Continue routine f/u with Dr. Debara Pickett.   SIMMONS, Sasser 01/26/2014 9:13 AM

## 2014-01-27 ENCOUNTER — Encounter (INDEPENDENT_AMBULATORY_CARE_PROVIDER_SITE_OTHER): Payer: BC Managed Care – PPO | Admitting: General Surgery

## 2014-02-02 ENCOUNTER — Telehealth: Payer: Self-pay | Admitting: Dietician

## 2014-02-02 NOTE — Telephone Encounter (Signed)
Responded to Obie's question about taking Benefiber. Suggested that she take Benefiber or Miralax regularly until constipation subsides. Also answered her question about qualifying for the [facility] program. She has paid the $100 program fee and should be able to start [facility] when cleared. Recommended that she direct the contact at the [facility] program to our office if there is any question about her paying the fee.

## 2014-03-01 ENCOUNTER — Encounter: Payer: Self-pay | Admitting: Internal Medicine

## 2014-03-01 ENCOUNTER — Ambulatory Visit (INDEPENDENT_AMBULATORY_CARE_PROVIDER_SITE_OTHER): Payer: BC Managed Care – PPO | Admitting: Internal Medicine

## 2014-03-01 VITALS — BP 124/90 | HR 75 | Ht 62.0 in | Wt 305.3 lb

## 2014-03-01 DIAGNOSIS — Z9884 Bariatric surgery status: Secondary | ICD-10-CM

## 2014-03-01 DIAGNOSIS — I4891 Unspecified atrial fibrillation: Secondary | ICD-10-CM

## 2014-03-01 DIAGNOSIS — I10 Essential (primary) hypertension: Secondary | ICD-10-CM

## 2014-03-01 DIAGNOSIS — E785 Hyperlipidemia, unspecified: Secondary | ICD-10-CM

## 2014-03-01 DIAGNOSIS — I9789 Other postprocedural complications and disorders of the circulatory system, not elsewhere classified: Secondary | ICD-10-CM

## 2014-03-01 MED ORDER — METOPROLOL SUCCINATE ER 25 MG PO TB24: 12.5000 mg | ORAL_TABLET | Freq: Every day | ORAL | Status: DC

## 2014-03-01 NOTE — Patient Instructions (Signed)
Your physician has recommended you make the following change in your medication..  1. START Toprol XL12.5mg  once daily 2. DECREASE amlodipine to 5mg  once daily 3. START aspirin 81mg  once daily (if OK with surgeon)  Your physician wants you to follow-up in: 1 year with Dr. Debara Pickett. You will receive a reminder letter in the mail two months in advance. If you don't receive a letter, please call our office to schedule the follow-up appointment.

## 2014-03-01 NOTE — Progress Notes (Signed)
OFFICE NOTE  Chief Complaint:  Cardiac clearance for surgery, establish new cardiologist  Primary Care Physician: Terald Sleeper, PA-C  HPI:  Rebekah [name] is a pleasant 58 year old female with a past medical history significant for her super morbid obesity, chronic hypoxia, hypertension, GERD and prior knee replacement. She was previously followed by Dr. Rollene Fare and was being evaluated for bariatric surgery. She is here to establish new care with me today and for preoperative evaluation. 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 She appears he seen Dr. Rollene Fare will order an echocardiogram on August of 2014 which showed moderate thickening of the left ventricle with EF of 60-65%, mildly increased RV systolic pressure and no significant valvular disease. There was left atrial enlargement. She denies any chest pain but does have expected shortness of breath with exertion. Lower extremity venous Dopplers were performed which show no evidence of significant venous insufficiency.    Rebekah [name] returns today for follow-up. She seems to be doing remarkably well. She underwent gastric sleeve surgery and has had close to 60 pound weight loss. Unfortunately she was complicated by postoperative atrial fibrillation. She was seen by Dr. Martinique and placed on Cardizem. Subsequently she converted to sinus rhythm within 24 hours spontaneously. She's had no recurrence since that time and has not been additionally anticoagulated. She was not continued on Cardizem. She does have an elevated CHADSVASC score of 2.   PMHx:  Past Medical History  Diagnosis Date  . Arthritis   . Hypertension   . GERD (gastroesophageal reflux disease)   . Obesity   . Complication of anesthesia   . PONV (postoperative nausea and vomiting)   . Shortness of breath     WITH EXERTION -  uses 02 3L nightly / 5 L  other times with exertion   . Environmental allergies   . Incontinence of urine   . Urgency of urination   . Difficulty sleeping     Past Surgical History  Procedure Laterality Date  . Tonsillectomy    . Cholecystectomy    . Appendectomy    . Tubal ligation    . Transthoracic echocardiogram  11/2012    EF 60-65%, mod LVH & mod conc hypertrophy, grade 1 diastolic dysfunction; LA mildly dilated; RV systolic pressure increased; PA peak pressure 48mmHg  . Joint replacement  1999    L TOTAL KNEE  . Laparoscopic gastric sleeve resection N/A 01/04/2014    Procedure: LAPAROSCOPIC GASTRIC SLEEVE RESECTION;  Surgeon: Greer Pickerel, MD;  Location: [facility] ORS;  Service: General;  Laterality: N/A;  . Upper gi endoscopy  01/04/2014    Procedure: UPPER GI ENDOSCOPY;  Surgeon: Greer Pickerel, MD;  Location: [facility] ORS;  Service: General;;    FAMHx:  Family History  Problem Relation Age of Onset  . Diabetes Father   . Diabetes Maternal Grandmother   . Uterine cancer Mother   . Cervical cancer Mother   . Breast cancer Mother   . Cancer Mother     cervical and brreast cancer    SOCHx:   reports that she has never smoked. She does not have any smokeless tobacco history on file. She reports that she does not drink alcohol or use illicit drugs.  ALLERGIES:  Allergies  Allergen Reactions  . Mucinex [Guaifenesin Er] Anaphylaxis  . Ace Inhibitors Swelling and Cough  Pedal Edema  . Propoxyphene Hcl     REACTION: hallucinate  . Sulfonamide Derivatives     REACTION: rash  . Hydromet [Hydrocodone-Homatropine] Hives and Rash    ROS: A comprehensive review of systems was negative except for: Constitutional: positive for weight loss  HOME MEDS: Current Outpatient Prescriptions  Medication Sig Dispense Refill  . Acetaminophen (TYLENOL PO) Take by mouth as needed.    Marland Kitchen amLODipine (NORVASC) 10 MG tablet Take 5 mg by mouth every morning.     Marland Kitchen aspirin 81 MG tablet Take 81 mg by  mouth daily.    . cetirizine (ZYRTEC) 10 MG tablet Take 10 mg by mouth at bedtime.     Marland Kitchen EPINEPHrine (EPIPEN) 0.3 mg/0.3 mL DEVI Inject 0.3 mg into the muscle once.    . escitalopram (LEXAPRO) 20 MG tablet Take 20 mg by mouth every morning.     . furosemide (LASIX) 40 MG tablet Take 20-40 mg by mouth daily as needed for fluid or edema.     Marland Kitchen HYDROcodone-acetaminophen (NORCO) 10-325 MG per tablet Take 0.5 tablets by mouth as needed for moderate pain.     Marland Kitchen KLOR-CON M20 20 MEQ tablet Take 10-20 mEq by mouth daily as needed.     . lansoprazole (PREVACID) 30 MG capsule Take 30 mg by mouth every morning.     Marland Kitchen levothyroxine (SYNTHROID, LEVOTHROID) 50 MCG tablet Take 50 mcg by mouth daily before breakfast.    . Magnesium Hydroxide (MILK OF MAGNESIA PO) Take by mouth at bedtime. 5x weekly    . Multiple Minerals-Vitamins (CALCIUM & VIT D3 BONE HEALTH PO) Take by mouth.    Marland Kitchen OVER THE COUNTER MEDICATION Journey 3+3 Multivitamin, take 1 tablet daily.    . OXYGEN Inhale 5 L/min into the lungs continuous as needed (when active). 3 liters nightly    . solifenacin (VESICARE) 10 MG tablet Take 10 mg by mouth every morning.    Marland Kitchen VIGAMOX 0.5 % ophthalmic solution Place 1 drop into the left eye 4 (four) times daily.    . metoprolol succinate (TOPROL-XL) 25 MG 24 hr tablet Take 0.5 tablets (12.5 mg total) by mouth daily. 15 tablet 11   No current facility-administered medications for this visit.    LABS/IMAGING: No results found for this or any previous visit (from the past 48 hour(s)). No results found.  VITALS: BP 124/90 mmHg  Pulse 75  Ht 5\' 2"  (1.575 m)  Wt 305 lb 4.8 oz (138.483 kg)  BMI 55.83 kg/m2  EXAM: General appearance: alert, no distress and morbidly obese Neck: no carotid bruit and no JVD Lungs: diminished breath sounds bilaterally Heart: regular rate and rhythm, S1, S2 normal, no murmur, click, rub or gallop Abdomen: morbidly obese Extremities: extremities normal, atraumatic, no  cyanosis or edema Pulses: 2+ and symmetric Skin: Skin color, texture, turgor normal. No rashes or lesions Neurologic: Grossly normal Psych: Mood, affect normal  EKG:  normal sinus rhythm at 75  ASSESSMENT: 1. Marked weight loss s/p bariatric surgery 2. Super morbid obesity 3. Hypoxia secondary to #2 4. Hypertension 5. Postoperative transient A. fib  PLAN: 1.    Mrs. Carrera had successful bariatric surgery and has now lost about 60 pounds. She is discontinued her Diovan and blood pressure seems to be stable on amlodipine. Unfortunately she had perioperative A. Fib which was brief. She does not note any recurrence and was symptomatic. I think it is unlikely that she will have recurrent A. Fib however she would be at  increased risk of stroke if this were to happen. At this point I would recommend low-dose daily aspirin, which she could also benefit from base of the fact that she has chronic arthritic pain. She was advised by her surgeon to stay off of NSAIDs however I believe low-dose aspirin would be a reasonable compromise. She will discuss it with him. This should give her some stroke protection if she were to have recurrent A. Fib. Also, if she were to have recurrent A. Fib not associated with the surgery, she will need to be on medication to keep her rate controlled. I would recommend decreasing her Norvasc to 5 mg daily and adding low-dose Toprol-XL 12.5 mg daily to her regimen. Otherwise she is doing well and I'm happy to see her back annually or sooner as necessary.  Pixie Casino, MD, [facility] Attending Cardiologist [facility]  Braedin Millhouse C 03/01/2014, 10:09 AM

## 2014-03-04 ENCOUNTER — Ambulatory Visit: Payer: BC Managed Care – PPO | Admitting: Dietician

## 2014-03-04 ENCOUNTER — Encounter: Payer: BC Managed Care – PPO | Attending: General Surgery | Admitting: Dietician

## 2014-03-04 DIAGNOSIS — Z713 Dietary counseling and surveillance: Secondary | ICD-10-CM | POA: Diagnosis not present

## 2014-03-04 DIAGNOSIS — Z6841 Body Mass Index (BMI) 40.0 and over, adult: Secondary | ICD-10-CM | POA: Insufficient documentation

## 2014-03-04 NOTE — Patient Instructions (Addendum)
-  Keep portions of nuts and seeds very small -Enjoy small bites of favorites during the holidays and then get back on track  [place] RESULTS  11/30/13 01/19/14 03/04/14   BMI (kg/m^2) 67.5 60.4 55.2   Fat Mass (lbs) 196 160.0 142.5   Fat Free Mass (lbs) 161.5 170.5 159.5   Total Body Water (lbs) 118 125.0 117

## 2014-03-04 NOTE — Progress Notes (Signed)
  Follow-up visit:  8 Weeks Post-Operative Gastric sleeve Surgery  Medical Nutrition Therapy:  Appt start time: 910 end time:  1000.  Primary concerns today: Post-operative Bariatric Surgery Nutrition Management. Rebekah [name] returns today stating that she is feeling well and tolerating all recommended foods. She is still struggling with constipation and take Milk of Magnesia in pill form. She reports that she belches excessively when she gets too full.   Surgery date: 01/04/14 Surgery type: Gastric sleeve Start weight at [facility]: 367 lbs on 08/19/13 Weight today: 302 lbs Weight change: 28.5 lbs Total weight lost: 65 lbs  TANITA  BODY COMP RESULTS  11/30/13 01/19/14 03/04/14   BMI (kg/m^2) 67.5 60.4 55.2   Fat Mass (lbs) 196 160.0 142.5   Fat Free Mass (lbs) 161.5 170.5 159.5   Total Body Water (lbs) 118 125.0 117    Preferred Learning Style:  No preference indicated   Learning Readiness:   Ready  24-hr recall: B (AM): Premier protein shake and egg with slice of cheese (38S) Snk (AM): sometimes, low fat string cheese (6g)  L (PM): 2-3 oz fat free cottage cheese (12-18g) Snk (PM):   D (PM): 2-3 oz pulled pork with [employer] sauce (14-21g) Snk (PM): protein shake (30g)  Fluid intake: water, water with sugarfree flavoring Estimated total protein intake: 80-90 grams per day per patient  Medications: see list; stopped taking BP meds Supplementation: taking  Drinking while eating: no Hair loss: "not any more than normal" Carbonated beverages: none N/V/D/C: none; belching when she gets too full; constipation resolved with Milk of Magnesia pill Dumping syndrome: none  Recent physical activity:  none  Progress Towards Goal(s):  In progress.  Handouts given during visit include:  Phase 3B lean protein + non starchy vegetables  Bariatric Fast Food Guide   Nutritional Diagnosis:  [place]-3.3 Overweight/obesity related to past poor dietary habits and physical inactivity as evidenced  by patient w/ recent gastric sleeve surgery following dietary guidelines for continued weight loss.     Intervention:  Nutrition counseling provided.  Teaching Method Utilized:  Visual Auditory  Barriers to learning/adherence to lifestyle change: none  Demonstrated degree of understanding via:  Teach Back   Monitoring/Evaluation:  Dietary intake, exercise, and body weight. Follow up in 2 months for 5 month post-op visit.

## 2014-04-22 ENCOUNTER — Encounter: Payer: BC Managed Care – PPO | Attending: General Surgery | Admitting: Dietician

## 2014-04-22 DIAGNOSIS — Z6841 Body Mass Index (BMI) 40.0 and over, adult: Secondary | ICD-10-CM | POA: Insufficient documentation

## 2014-04-22 DIAGNOSIS — Z713 Dietary counseling and surveillance: Secondary | ICD-10-CM | POA: Diagnosis not present

## 2014-04-22 NOTE — Progress Notes (Signed)
  Follow-up visit:  3.5 months Post-Operative Gastric sleeve Surgery  Medical Nutrition Therapy:  Appt start time: 716 end time:  967  Primary concerns today: Post-operative Bariatric Surgery Nutrition Management.   Rebekah [name] returns having lost 17.5 lbs since last visit. She has been eating out a lot. She reports she is still struggling with constipation. Trying to eat more leafy greens and broccoli (raw or steamed). Able to tolerate 3 oz of meat; meeting protein needs.  Surgery date: 01/04/14 Surgery type: Gastric sleeve Start weight at [facility]: 367 lbs on 08/19/13 (372 lbs per patient) Weight today: 284.5 lbs Weight change: 17.5 lbs Total weight lost: 87.5 lbs  TANITA  BODY COMP RESULTS  11/30/13 01/19/14 03/04/14 04/22/14   BMI (kg/m^2) 67.5 60.4 55.2 52   Fat Mass (lbs) 196 160.0 142.5 131.5   Fat Free Mass (lbs) 161.5 170.5 159.5 153   Total Body Water (lbs) 118 125.0 117 112    Preferred Learning Style:  No preference indicated   Learning Readiness:   Ready  24-hr recall: B (AM): Premier protein shake and 1 egg with slice of fat free cheese (40g) Snk (AM):   L (PM): 2-3 oz fat free cottage cheese and celery (12-18g) Snk (PM): 5-6 nuts or cheese stick (6g) D (PM): [employer] roasted chicken bites OR [employer] chili (~12g) Snk (PM): protein shake if needed (30g)  Fluid intake: water, water with sugarfree flavoring Estimated total protein intake: 80-90 grams per day per patient  Medications: see list; taking 1/2 amlodipine, toprol added Supplementation: taking  Drinking while eating: no Hair loss: yes Carbonated beverages: none N/V/D/C: none; belching when she gets too full; constipation resolved with Milk of Magnesia pill Dumping syndrome: none  Recent physical activity:  none  Progress Towards Goal(s):  In progress.   Nutritional Diagnosis:  [place]-3.3 Overweight/obesity related to past poor dietary habits and physical inactivity as evidenced by patient w/ recent gastric  sleeve surgery following dietary guidelines for continued weight loss.     Intervention:  Nutrition counseling provided. Goals: -Try a diet Fudgesicle -Limit 15 grams of carbs per meal -Try water with cut up fruit -Try Quest protein chips -Try a Magnesium citrate supplement with calcium  Teaching Method Utilized:  Visual Auditory  Barriers to learning/adherence to lifestyle change: none  Demonstrated degree of understanding via:  Teach Back   Monitoring/Evaluation:  Dietary intake, exercise, and body weight. Follow up in 3 months for 6.5 month post-op visit.

## 2014-04-22 NOTE — Patient Instructions (Addendum)
-  Try a diet Fudgesicle -Limit 15 grams of carbs per meal -Try water with cut up fruit -Try Quest protein chips -Try a Magnesium citrate supplement with calcium   TANITA  BODY COMP RESULTS  11/30/13 01/19/14 03/04/14 04/22/14   BMI (kg/m^2) 67.5 60.4 55.2 52   Fat Mass (lbs) 196 160.0 142.5 131.5   Fat Free Mass (lbs) 161.5 170.5 159.5 153   Total Body Water (lbs) 118 125.0 117 112

## 2014-04-29 ENCOUNTER — Ambulatory Visit: Payer: BC Managed Care – PPO | Admitting: Dietician

## 2014-07-20 ENCOUNTER — Emergency Department (HOSPITAL_COMMUNITY): Payer: BC Managed Care – PPO

## 2014-07-20 ENCOUNTER — Emergency Department (HOSPITAL_COMMUNITY)
Admission: EM | Admit: 2014-07-20 | Discharge: 2014-07-20 | Disposition: A | Discharge status: Home / Self Care | Payer: BC Managed Care – PPO | Attending: Emergency Medicine | Admitting: Emergency Medicine

## 2014-07-20 ENCOUNTER — Encounter (HOSPITAL_COMMUNITY): Payer: Self-pay

## 2014-07-20 DIAGNOSIS — Z8719 Personal history of other diseases of the digestive system: Secondary | ICD-10-CM | POA: Insufficient documentation

## 2014-07-20 DIAGNOSIS — I10 Essential (primary) hypertension: Secondary | ICD-10-CM | POA: Diagnosis not present

## 2014-07-20 DIAGNOSIS — R079 Chest pain, unspecified: Secondary | ICD-10-CM

## 2014-07-20 DIAGNOSIS — I480 Paroxysmal atrial fibrillation: Secondary | ICD-10-CM | POA: Diagnosis not present

## 2014-07-20 DIAGNOSIS — I4891 Unspecified atrial fibrillation: Secondary | ICD-10-CM | POA: Diagnosis present

## 2014-07-20 DIAGNOSIS — M1990 Unspecified osteoarthritis, unspecified site: Secondary | ICD-10-CM | POA: Diagnosis not present

## 2014-07-20 DIAGNOSIS — Z79899 Other long term (current) drug therapy: Secondary | ICD-10-CM | POA: Insufficient documentation

## 2014-07-20 DIAGNOSIS — E669 Obesity, unspecified: Secondary | ICD-10-CM | POA: Diagnosis not present

## 2014-07-20 DIAGNOSIS — I481 Persistent atrial fibrillation: Secondary | ICD-10-CM | POA: Insufficient documentation

## 2014-07-20 DIAGNOSIS — Z7982 Long term (current) use of aspirin: Secondary | ICD-10-CM | POA: Insufficient documentation

## 2014-07-20 DIAGNOSIS — I4819 Other persistent atrial fibrillation: Secondary | ICD-10-CM

## 2014-07-20 LAB — CBC WITH DIFFERENTIAL/PLATELET
Basophils Absolute: 0.1 10*3/uL (ref 0.0–0.1)
Basophils Relative: 1 % (ref 0–1)
Eosinophils Absolute: 0.2 10*3/uL (ref 0.0–0.7)
Eosinophils Relative: 2 % (ref 0–5)
HEMATOCRIT: 45.1 % (ref 36.0–46.0)
Hemoglobin: 14.2 g/dL (ref 12.0–15.0)
Lymphocytes Relative: 27 % (ref 12–46)
Lymphs Abs: 2.4 10*3/uL (ref 0.7–4.0)
MCH: 28.3 pg (ref 26.0–34.0)
MCHC: 31.5 g/dL (ref 30.0–36.0)
MCV: 89.8 fL (ref 78.0–100.0)
Monocytes Absolute: 0.8 10*3/uL (ref 0.1–1.0)
Monocytes Relative: 9 % (ref 3–12)
NEUTROS ABS: 5.5 10*3/uL (ref 1.7–7.7)
Neutrophils Relative %: 61 % (ref 43–77)
Platelets: 151 10*3/uL (ref 150–400)
RBC: 5.02 MIL/uL (ref 3.87–5.11)
RDW: 17.6 % — AB (ref 11.5–15.5)
WBC: 9 10*3/uL (ref 4.0–10.5)

## 2014-07-20 LAB — BASIC METABOLIC PANEL
ANION GAP: 18 — AB (ref 5–15)
BUN: 22 mg/dL (ref 6–23)
CALCIUM: 9.9 mg/dL (ref 8.4–10.5)
CO2: 20 mmol/L (ref 19–32)
Chloride: 103 mmol/L (ref 96–112)
Creatinine, Ser: 0.74 mg/dL (ref 0.50–1.10)
GFR calc non Af Amer: >90 mL/min (ref >90)
Glucose, Bld: 87 mg/dL (ref 70–99)
Potassium: 4 mmol/L (ref 3.5–5.1)
SODIUM: 141 mmol/L (ref 135–145)

## 2014-07-20 LAB — TROPONIN I

## 2014-07-20 LAB — I-STAT TROPONIN, ED: TROPONIN I, POC: 0 ng/mL (ref 0.00–0.08)

## 2014-07-20 LAB — TSH: TSH: 1.523 u[IU]/mL (ref 0.350–4.500)

## 2014-07-20 MED ORDER — RIVAROXABAN 15 MG PO TABS: 15.0000 mg | ORAL_TABLET | Freq: Once | ORAL | Status: DC

## 2014-07-20 MED ORDER — DILTIAZEM HCL 25 MG/5ML IV SOLN
20.0000 mg | Freq: Once | INTRAVENOUS | Status: AC
  Administered 2014-07-20: 20 mg via INTRAVENOUS
  Filled 2014-07-20: qty 5

## 2014-07-20 MED ORDER — SODIUM CHLORIDE 0.9 % IV BOLUS (SEPSIS)
500.0000 mL | Freq: Once | INTRAVENOUS | Status: AC
  Administered 2014-07-20: 500 mL via INTRAVENOUS

## 2014-07-20 MED ORDER — DILTIAZEM HCL ER COATED BEADS 120 MG PO CP24
120.0000 mg | ORAL_CAPSULE | Freq: Every day | ORAL | Status: DC
  Administered 2014-07-20: 120 mg via ORAL
  Filled 2014-07-20 (×2): qty 1

## 2014-07-20 MED ORDER — DILTIAZEM HCL ER 120 MG PO CP24: 120.0000 mg | ORAL_CAPSULE | Freq: Every day | ORAL | Status: DC

## 2014-07-20 MED ORDER — RIVAROXABAN 20 MG PO TABS
20.0000 mg | ORAL_TABLET | Freq: Once | ORAL | Status: AC
  Administered 2014-07-20: 20 mg via ORAL
  Filled 2014-07-20: qty 1

## 2014-07-20 MED ORDER — FLECAINIDE ACETATE 100 MG PO TABS
300.0000 mg | ORAL_TABLET | Freq: Once | ORAL | Status: AC
  Administered 2014-07-20: 300 mg via ORAL
  Filled 2014-07-20: qty 3

## 2014-07-20 MED ORDER — XARELTO VTE STARTER PACK 15 & 20 MG PO TBPK: 20.0000 mg | ORAL_TABLET | ORAL | Status: DC

## 2014-07-20 NOTE — Discharge Instructions (Signed)

## 2014-07-20 NOTE — ED Notes (Addendum)
Cardiologist at bedside.  

## 2014-07-20 NOTE — ED Provider Notes (Signed)
CSN: [id number]     Arrival date & time 07/20/14  1350 History   First MD Initiated Contact with Patient 07/20/14 1459     Chief Complaint  Patient presents with  . Atrial Fibrillation     (Consider location/radiation/quality/duration/timing/severity/associated sxs/prior Treatment) HPI Comments: Noted some occasional palpitations in her chest last night. Patient denies any CP, SOB. Noted "irregular heartbeat" on her automatic blood pressure cuff last night.  Hx of Afib once previously after bariatric surgery. On metoprolol and aspirin, but not on any long-term anti-coagulants.  Patient is a 59 y.o. female presenting with atrial fibrillation. The history is provided by the patient.  Atrial Fibrillation This is a recurrent problem. The current episode started yesterday. The problem occurs constantly. The problem has not changed since onset.Pertinent negatives include no abdominal pain and no shortness of breath. Nothing aggravates the symptoms. Nothing relieves the symptoms.    Past Medical History  Diagnosis Date  . Arthritis   . Hypertension   . GERD (gastroesophageal reflux disease)   . Obesity   . Complication of anesthesia   . PONV (postoperative nausea and vomiting)   . Shortness of breath     WITH EXERTION - uses 02 3L nightly / 5 L  other times with exertion   . Environmental allergies   . Incontinence of urine   . Urgency of urination   . Difficulty sleeping    Past Surgical History  Procedure Laterality Date  . Tonsillectomy    . Cholecystectomy    . Appendectomy    . Tubal ligation    . Transthoracic echocardiogram  11/2012    EF 60-65%, mod LVH & mod conc hypertrophy, grade 1 diastolic dysfunction; LA mildly dilated; RV systolic pressure increased; PA peak pressure 65mmHg  . Joint replacement  1999    L TOTAL KNEE  . Laparoscopic gastric sleeve resection N/A 01/04/2014    Procedure: LAPAROSCOPIC GASTRIC SLEEVE RESECTION;  Surgeon: Greer Pickerel, MD;  Location: [facility];   Service: General;  Laterality: N/A;  . Upper gi endoscopy  01/04/2014    Procedure: UPPER GI ENDOSCOPY;  Surgeon: Greer Pickerel, MD;  Location: [facility];  Service: General;;   Family History  Problem Relation Age of Onset  . Diabetes Father   . Diabetes Maternal Grandmother   . Uterine cancer Mother   . Cervical cancer Mother   . Breast cancer Mother   . Cancer Mother     cervical and brreast cancer   History  Substance Use Topics  . Smoking status: Never Smoker   . Smokeless tobacco: Not on file  . Alcohol Use: No   OB History    No data available     Review of Systems  Constitutional: Negative for fever and chills.  Respiratory: Negative for cough and shortness of breath.   Cardiovascular: Positive for palpitations (occasional, mild).  Gastrointestinal: Negative for vomiting and abdominal pain.  All other systems reviewed and are negative.     Allergies  Mucinex; Ace inhibitors; Propoxyphene hcl; Sulfonamide derivatives; and Hydromet  Home Medications   Prior to Admission medications   Medication Sig Start Date End Date Taking? Authorizing Provider  acetaminophen (TYLENOL) 500 MG tablet Take 500-1,000 mg by mouth every 6 (six) hours as needed for moderate pain or headache.   Yes Historical Provider, MD  amLODipine (NORVASC) 10 MG tablet Take 5 mg by mouth every morning.    Yes Historical Provider, MD  aspirin 81 MG tablet Take 81 mg by mouth  daily.   Yes Historical Provider, MD  BIOTIN PO Take 1 tablet by mouth daily.   Yes Historical Provider, MD  cetirizine (ZYRTEC) 10 MG tablet Take 10 mg by mouth at bedtime.    Yes Historical Provider, MD  docusate sodium (COLACE) 100 MG capsule Take 100 mg by mouth daily as needed for mild constipation.   Yes Historical Provider, MD  EPINEPHrine (EPIPEN) 0.3 mg/0.3 mL DEVI Inject 0.3 mg into the muscle once.   Yes Historical Provider, MD  escitalopram (LEXAPRO) 20 MG tablet Take 20 mg by mouth every morning.    Yes Historical  Provider, MD  furosemide (LASIX) 40 MG tablet Take 20-40 mg by mouth daily as needed for fluid or edema.  12/03/13  Yes Historical Provider, MD  HYDROcodone-acetaminophen (NORCO) 10-325 MG per tablet Take 0.5 tablets by mouth as needed for moderate pain.    Yes Historical Provider, MD  KLOR-CON M20 20 MEQ tablet Take 10-20 mEq by mouth daily as needed (when takes lasix).  11/04/13  Yes Historical Provider, MD  levothyroxine (SYNTHROID, LEVOTHROID) 50 MCG tablet Take 50 mcg by mouth daily before breakfast.   Yes Historical Provider, MD  metoprolol succinate (TOPROL-XL) 25 MG 24 hr tablet Take 0.5 tablets (12.5 mg total) by mouth daily. 03/01/14  Yes Pixie Casino, MD  Multiple Minerals-Vitamins (CALCIUM & VIT D3 BONE HEALTH PO) Take 1 tablet by mouth daily.    Yes Historical Provider, MD  OVER THE COUNTER MEDICATION Journey 3+3 Multivitamin, take 1 tablet daily.   Yes Historical Provider, MD  OXYGEN Inhale 5 L/min into the lungs continuous as needed (when active). 3 liters nightly   Yes Historical Provider, MD  solifenacin (VESICARE) 10 MG tablet Take 10 mg by mouth every morning.   Yes Historical Provider, MD   BP 135/89 mmHg  Pulse 146  Temp(Src) 97.8 F (36.6 C) (Oral)  Resp 16  SpO2 96% Physical Exam  Constitutional: She is oriented to person, place, and time. She appears well-developed and well-nourished. No distress.  HENT:  Head: Normocephalic and atraumatic.  Mouth/Throat: Oropharynx is clear and moist.  Eyes: EOM are normal. Pupils are equal, round, and reactive to light.  Neck: Normal range of motion. Neck supple.  Cardiovascular: An irregularly irregular rhythm present. Tachycardia present.  Exam reveals no friction rub.   No murmur heard. Pulmonary/Chest: Effort normal and breath sounds normal. No respiratory distress. She has no wheezes. She has no rales.  Abdominal: Soft. She exhibits no distension. There is no tenderness. There is no rebound.  Musculoskeletal: Normal range  of motion. She exhibits no edema.  Neurological: She is alert and oriented to person, place, and time. No cranial nerve deficit. She exhibits normal muscle tone.  Skin: No rash noted. She is not diaphoretic.  Nursing note and vitals reviewed.   ED Course  Procedures (including critical care time) Labs Review Labs Reviewed  CBC WITH DIFFERENTIAL/PLATELET - Abnormal; Notable for the following:    RDW 17.6 (*)    All other components within normal limits  BASIC METABOLIC PANEL  I-STAT TROPOININ, ED    Imaging Review Dg Chest Port 1 View  07/20/2014   CLINICAL DATA:  Chest pain, atrial fibrillation complicating prior bariatric surgery, hypertension  EXAM: PORTABLE CHEST - 1 VIEW  COMPARISON:  Portable exam 1457 hours compared to 08/12/2013  FINDINGS: Minimal enlargement of cardiac silhouette with pulmonary vascular congestion.  Mediastinal contours normal.  Lungs clear.  No pleural effusion or pneumothorax.  Osseous structures  unremarkable.  IMPRESSION: Minimal enlargement of cardiac silhouette with slight pulmonary vascular congestion.  No acute infiltrate.   Electronically Signed   By: Lavonia Dana M.D.   On: 07/20/2014 15:32     EKG Interpretation   Date/Time:  Tuesday July 20 2014 14:04:58 EDT Ventricular Rate:  154 PR Interval:    QRS Duration: 85 QT Interval:  301 QTC Calculation: 482 R Axis:   75 Text Interpretation:  Atrial fibrillation with rapid V-rate Anteroseptal  infarct, old Repolarization abnormality, prob rate related T wave  inversion V3-V6, new, Afib old Confirmed by Mingo Amber  MD, [place] ([id number]) on  07/20/2014 3:02:19 PM      MDM   Final diagnoses:  Persistent atrial fibrillation    59 year old female here with atrial fibrillation. History of once in the PACU after bariatric surgery. As not had a recurrence since then. She has seen Dr. Debara Pickett with cardiology for this before. She's on metoprolol for blood pressure. She stated they changed her to metoprolol from  Diovan when she was known to have A. fib. She is on aspirin but no other anticoagulants. She noticed some skipping beats in her chest last night. She does not feel bad or complaint chest pain, shortness of breath, fever. All blood pressures here. Will start with IV don't bolus and speak with cardiology. If IV Cardizem does not work for conversion back to sinus rhythm, will discuss electrical cardioversion with patient.  Repeat EKG with better rate control, but patient is still in Atrial Fibrillation.  Seen by Cards, recommended Xarelto initiation, switching from toprol to diltiazem, and f/u in a day or two. Patient comfortable with this plan. Stable for discharge, given first dose of PO dilt here.  Evelina Bucy, MD 07/20/14 [phone]

## 2014-07-20 NOTE — Consult Note (Signed)
  CONSULTATION NOTE  Reason for Consult: A-fib   Requesting Physician: Dr. Walden   Cardiologist: Hilty  HPI: This is a 59 y.o. female with a past medical history significant for super morbid obesity, chronic hypoxia, hypertension, GERD and prior knee replacement. She was previously followed by Dr. Weintraub and was being evaluated for bariatric surgery. She was recently seen in November of 2015 to establish new care with me. 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 Unfortunately she's had significant weight gain over the past several years and is now significantly affecting her quality of life. She appears he seen Dr. Weintraub will order an echocardiogram on August of 2014 which showed moderate thickening of the left ventricle with EF of 60-65%, mildly increased RV systolic pressure and no significant valvular disease. There was left atrial enlargement. She denies any chest pain but does have expected shortness of breath with exertion. Lower extremity venous Dopplers were performed which show no evidence of significant venous insufficiency.Ultimately, she underwent gastric sleeve surgery and has had close to 60 pound weight loss - at this point, I understand it is close to 150 lbs. Unfortunately her hospitalization was complicated by postoperative atrial fibrillation. She was seen by Dr. Jordan and placed on Cardizem. Subsequently she converted to sinus rhythm within 24 hours spontaneously. She's had no recurrence since that time and has not been additionally anticoagulated. She was not continued on Cardizem. She does have an elevated CHADSVASC score of 2. When I saw her in the office I recommended decreasing her Norvasc to 5 mg daily and adding low-dose Toprol XL 12.5 mg daily.  I'm now asked to consult on her as she's gone back into atrial fibrillation. Cardiology was notified earlier of her abnormal rhythm and given the fact that she is had onset within the last 24 hours,  recommended single dose 300 mg flecainide. She is now been 1-1/2 hours since her dose of flecainide and she remains in atrial fibrillation. She was also given Xarelto 20 mg.  PMHx:  Past Medical History  Diagnosis Date  . Arthritis   . Hypertension   . GERD (gastroesophageal reflux disease)   . Obesity   . Complication of anesthesia   . PONV (postoperative nausea and vomiting)   . Shortness of breath     WITH EXERTION - uses 02 3L nightly / 5 L  other times with exertion   . Environmental allergies   . Incontinence of urine   . Urgency of urination   . Difficulty sleeping    Past Surgical History  Procedure Laterality Date  . Tonsillectomy    . Cholecystectomy    . Appendectomy    . Tubal ligation    . Transthoracic echocardiogram  11/2012    EF 60-65%, mod LVH & mod conc hypertrophy, grade 1 diastolic dysfunction; LA mildly dilated; RV systolic pressure increased; PA peak pressure 36mmHg  . Joint replacement  1999    L TOTAL KNEE  . Laparoscopic gastric sleeve resection N/A 01/04/2014    Procedure: LAPAROSCOPIC GASTRIC SLEEVE RESECTION;  Surgeon: Eric Wilson, MD;  Location: [facility];  Service: General;  Laterality: N/A;  . Upper gi endoscopy  01/04/2014    Procedure: UPPER GI ENDOSCOPY;  Surgeon: Eric Wilson, MD;  Location: [facility];  Service: General;;    FAMHx: Family History  Problem Relation Age of Onset  . Diabetes Father   . Diabetes Maternal Grandmother   . Uterine cancer Mother   . Cervical   cancer Mother   . Breast cancer Mother   . Cancer Mother     cervical and brreast cancer    SOCHx:  reports that she has never smoked. She does not have any smokeless tobacco history on file. She reports that she does not drink alcohol or use illicit drugs.  ALLERGIES: Allergies  Allergen Reactions  . Mucinex [Guaifenesin Er] Anaphylaxis  . Ace Inhibitors Swelling and Cough    Pedal Edema  . Propoxyphene Hcl     REACTION: hallucinate  . Sulfonamide Derivatives      REACTION: rash  . Hydromet [Hydrocodone-Homatropine] Hives and Rash    ROS: A comprehensive review of systems was negative except for: Cardiovascular: positive for palpitations  HOME MEDICATIONS:   Medication List    TAKE these medications        diltiazem 120 MG 24 hr capsule  Commonly known as:  DILACOR XR  Take 1 capsule (120 mg total) by mouth daily.     XARELTO STARTER PACK 15 & 20 MG Tbpk  Generic drug:  Rivaroxaban  Take 20 mg by mouth as directed. Take 20 mg once daily      ASK your doctor about these medications        acetaminophen 500 MG tablet  Commonly known as:  TYLENOL  Take 500-1,000 mg by mouth every 6 (six) hours as needed for moderate pain or headache.     amLODipine 10 MG tablet  Commonly known as:  NORVASC  Take 5 mg by mouth every morning.     aspirin 81 MG tablet  Take 81 mg by mouth daily.     BIOTIN PO  Take 1 tablet by mouth daily.     CALCIUM & VIT D3 BONE HEALTH PO  Take 1 tablet by mouth daily.     cetirizine 10 MG tablet  Commonly known as:  ZYRTEC  Take 10 mg by mouth at bedtime.     docusate sodium 100 MG capsule  Commonly known as:  COLACE  Take 100 mg by mouth daily as needed for mild constipation.     EPIPEN 0.3 mg/0.3 mL Soaj injection  Generic drug:  EPINEPHrine  Inject 0.3 mg into the muscle once.     escitalopram 20 MG tablet  Commonly known as:  LEXAPRO  Take 20 mg by mouth every morning.     furosemide 40 MG tablet  Commonly known as:  LASIX  Take 20-40 mg by mouth daily as needed for fluid or edema.     HYDROcodone-acetaminophen 10-325 MG per tablet  Commonly known as:  NORCO  Take 0.5 tablets by mouth as needed for moderate pain.     KLOR-CON M20 20 MEQ tablet  Generic drug:  potassium chloride SA  Take 10-20 mEq by mouth daily as needed (when takes lasix).     levothyroxine 50 MCG tablet  Commonly known as:  SYNTHROID, LEVOTHROID  Take 50 mcg by mouth daily before breakfast.     metoprolol succinate  25 MG 24 hr tablet  Commonly known as:  TOPROL-XL  Take 0.5 tablets (12.5 mg total) by mouth daily.     OVER THE COUNTER MEDICATION  Journey 3+3 Multivitamin, take 1 tablet daily.     OXYGEN  Inhale 5 L/min into the lungs continuous as needed (when active). 3 liters nightly     solifenacin 10 MG tablet  Commonly known as:  VESICARE  Take 10 mg by mouth every morning.          HOSPITAL MEDICATIONS: I have reviewed the patient's current medications.  VITALS: Blood pressure 112/86, pulse 75, temperature 97.8 F (36.6 C), temperature source Oral, resp. rate 19, SpO2 96 %.  PHYSICAL EXAM: General appearance: alert and no distress Neck: no carotid bruit and no JVD Lungs: clear to auscultation bilaterally Heart: irregularly irregular rhythm Abdomen: soft, non-tender; bowel sounds normal; no masses,  no organomegaly Extremities: extremities normal, atraumatic, no cyanosis or edema Pulses: 2+ and symmetric Skin: Skin color, texture, turgor normal. No rashes or lesions Neurologic: Grossly normal Psych: Pleasant  LABS: Results for orders placed or performed during the hospital encounter of 07/20/14 (from the past 48 hour(s))  CBC with Differential     Status: Abnormal   Collection Time: 07/20/14  3:04 PM  Result Value Ref Range   WBC 9.0 4.0 - 10.5 K/uL   RBC 5.02 3.87 - 5.11 MIL/uL   Hemoglobin 14.2 12.0 - 15.0 g/dL   HCT 45.1 36.0 - 46.0 %   MCV 89.8 78.0 - 100.0 fL   MCH 28.3 26.0 - 34.0 pg   MCHC 31.5 30.0 - 36.0 g/dL   RDW 17.6 (H) 11.5 - 15.5 %   Platelets 151 150 - 400 K/uL   Neutrophils Relative % 61 43 - 77 %   Neutro Abs 5.5 1.7 - 7.7 K/uL   Lymphocytes Relative 27 12 - 46 %   Lymphs Abs 2.4 0.7 - 4.0 K/uL   Monocytes Relative 9 3 - 12 %   Monocytes Absolute 0.8 0.1 - 1.0 K/uL   Eosinophils Relative 2 0 - 5 %   Eosinophils Absolute 0.2 0.0 - 0.7 K/uL   Basophils Relative 1 0 - 1 %   Basophils Absolute 0.1 0.0 - 0.1 K/uL  Basic metabolic panel     Status:  Abnormal   Collection Time: 07/20/14  3:04 PM  Result Value Ref Range   Sodium 141 135 - 145 mmol/L   Potassium 4.0 3.5 - 5.1 mmol/L   Chloride 103 96 - 112 mmol/L   CO2 20 19 - 32 mmol/L   Glucose, Bld 87 70 - 99 mg/dL   BUN 22 6 - 23 mg/dL   Creatinine, Ser 0.74 0.50 - 1.10 mg/dL   Calcium 9.9 8.4 - 10.5 mg/dL   GFR calc non Af Amer >90 >90 mL/min   GFR calc Af Amer >90 >90 mL/min    Comment: (NOTE) The eGFR has been calculated using the CKD EPI equation. This calculation has not been validated in all clinical situations. eGFR's persistently <90 mL/min signify possible Chronic Kidney Disease.    Anion gap 18 (H) 5 - 15  I-Stat Troponin, ED (not at [facility])     Status: None   Collection Time: 07/20/14  3:45 PM  Result Value Ref Range   Troponin i, poc 0.00 0.00 - 0.08 ng/mL   Comment 3            Comment: Due to the release kinetics of cTnI, a negative result within the first hours of the onset of symptoms does not rule out myocardial infarction with certainty. If myocardial infarction is still suspected, repeat the test at appropriate intervals.   TSH     Status: None   Collection Time: 07/20/14  3:46 PM  Result Value Ref Range   TSH 1.523 0.350 - 4.500 uIU/mL  Troponin I     Status: None   Collection Time: 07/20/14  3:47 PM  Result Value Ref Range   Troponin I <0.03 <  0.031 ng/mL    Comment:        NO INDICATION OF MYOCARDIAL INJURY.     IMAGING: Dg Chest Port 1 View  07/20/2014   CLINICAL DATA:  Chest pain, atrial fibrillation complicating prior bariatric surgery, hypertension  EXAM: PORTABLE CHEST - 1 VIEW  COMPARISON:  Portable exam 1457 hours compared to 08/12/2013  FINDINGS: Minimal enlargement of cardiac silhouette with pulmonary vascular congestion.  Mediastinal contours normal.  Lungs clear.  No pleural effusion or pneumothorax.  Osseous structures unremarkable.  IMPRESSION: Minimal enlargement of cardiac silhouette with slight pulmonary vascular congestion.  No  acute infiltrate.   Electronically Signed   By: Lavonia Dana M.D.   On: 07/20/2014 15:32    HOSPITAL DIAGNOSES: Principal Problem:   Paroxysmal a-fib Active Problems:   Essential hypertension   IMPRESSION: 1. Recurrent atrial fibrillation 2. HTN 3. CHADSVASC 2  RECOMMENDATION: 1. Mrs. Thane has recurrent atrial fibrillation. It was initially thought that this was just postoperative however it has now occurred away from surgery. She was maintained on aspirin, but I agree with increasing her anticoagulation based on her CHADSVASC score of 2. Xarelto 20 mg daily is a good option and she had her first dose tonight. I recommend that she continues on that without interruption. She had good rate control with IV Cardizem. I recommend starting Cardizem ER 120 mg daily. We can discontinue her amlodipine. She should remain on Toprol-XL 12.5 mg daily. She can discontinue aspirin. Stable for discharge. Our office will contact her tomorrow to schedule an outpatient cardioversion (no TEE) later this week at [facility].  Thanks for the consultation.  Time Spent Directly with Patient: 45 minutes  Pixie Casino, MD, [facility] Attending Cardiologist [facility]  Zarya Lasseigne C 07/20/2014, 7:43 PM

## 2014-07-20 NOTE — ED Notes (Signed)
Per pt, has gone into a-fib in past year post surgery.  Last night felt a little "strange in her chest".  This morning called MD and was told to come here.  Pt states she stills feels "different in chest".  Denies chest pain.

## 2014-07-21 ENCOUNTER — Encounter: Payer: Self-pay | Admitting: Internal Medicine

## 2014-07-21 ENCOUNTER — Other Ambulatory Visit: Payer: Self-pay | Admitting: *Deleted

## 2014-07-21 ENCOUNTER — Telehealth: Payer: Self-pay | Admitting: Internal Medicine

## 2014-07-21 ENCOUNTER — Ambulatory Visit (INDEPENDENT_AMBULATORY_CARE_PROVIDER_SITE_OTHER): Payer: BC Managed Care – PPO

## 2014-07-21 VITALS — BP 126/84 | HR 63 | Ht 62.0 in | Wt 249.4 lb

## 2014-07-21 DIAGNOSIS — I480 Paroxysmal atrial fibrillation: Secondary | ICD-10-CM

## 2014-07-21 DIAGNOSIS — I4891 Unspecified atrial fibrillation: Secondary | ICD-10-CM

## 2014-07-21 DIAGNOSIS — Z0189 Encounter for other specified special examinations: Secondary | ICD-10-CM

## 2014-07-21 MED ORDER — SODIUM CHLORIDE 0.9 % IV SOLN: INTRAVENOUS | Status: DC

## 2014-07-21 NOTE — Patient Instructions (Addendum)
Stop Norvasc ( Amlodipine )  Continue Toprol XL 12.5 mg daily  No Aspirin   Continue all other medications  Cardioversion cancelled 07/22/14   Keep appointment with Dr.Hilty Wed 08/18/14 at 2:45 pm.

## 2014-07-21 NOTE — Telephone Encounter (Signed)
Pt went to the ER yesterday for atrial fib. Please call,concerning her medicine,different dosage and different instructions.

## 2014-07-21 NOTE — Progress Notes (Signed)
1.) Reason for visit:EKG  2.) Name of MD requesting visit: Dr.Hilty  3.) H&P: Atrial Fib  4.) ROS related to problem: Patient feels like heart back in rhythm  5.) Assessment and plan per MD: Dr.Hilty advised cancel cardioversion scheduled 07/22/14.Stop Norvasc,continue Toprol XL 12.5 mg daily,No Aspirin,continue all other medications.Keep appointment with him 08/18/14.

## 2014-07-21 NOTE — Progress Notes (Deleted)
1.) Reason for visit: ***  2.) Name of MD requesting visit: ****  3.) H&P: *****  4.) ROS related to problem: *****  5.) Assessment and plan per MD: ****

## 2014-07-21 NOTE — Telephone Encounter (Signed)
Spoke with patient and she was confused about her xarelto dose. Her instructions from ED said to take xarelto 15mg  BID x21 days and on day 22 change to xarelto 20mg  daily. Only xarelto 20mg  was called in to patient's pharmacy. This medication discrepancy was clarified with Dr. Debara Pickett - she should take xarelto 20mg  once daily. Patient voiced understanding of this.   Patient also reports she feels as if she has converted back to SR. This was communicated to Dr. Debara Pickett and he recommended that patient come in for EKG check (along with a BP check) today to assess if is in AF or SR. Patient will come in around 230-3pm for this.

## 2014-07-22 ENCOUNTER — Encounter (HOSPITAL_COMMUNITY): Admission: RE | Payer: Self-pay | Source: Ambulatory Visit

## 2014-07-22 ENCOUNTER — Ambulatory Visit (HOSPITAL_COMMUNITY)
Admission: RE | Admit: 2014-07-22 | Payer: BC Managed Care – PPO | Source: Ambulatory Visit | Admitting: Internal Medicine

## 2014-07-22 ENCOUNTER — Ambulatory Visit: Payer: BC Managed Care – PPO | Admitting: Dietician

## 2014-07-22 SURGERY — CARDIOVERSION
Anesthesia: Monitor Anesthesia Care

## 2014-07-23 ENCOUNTER — Telehealth: Payer: Self-pay | Admitting: *Deleted

## 2014-07-23 NOTE — Telephone Encounter (Signed)
Patient called in with 2 questions/complaints.   1. She reports constipation after having bariatric surgery   >> she used to have BM QD and now goes about 1-2x/week  >> she was concerned that straining to have BM may trigger AF - she consulted with EDP about this who told her it would not - just concern of vagal response 2. She reports yesterday afternoon she had ~ 10 minutes of what she felt was AF and then again last nite 10-30 minutes - just as she had in ED. She reports his AM she had another episode lasting ~ 10 minutes.   >> she is NOT symptomatic with AF but is aware of it  >> she reports her wrist BP cuff alerts her when her heart beat is irregular.   >> she reports her heart beat is normal & steady now 3. Her BP ranges 90s/50s - 120s/80s Her HR ranges 40s-70s  Informed her I would notify Dr. Debara Pickett for advice - med changes? monitor until OV in May?

## 2014-07-23 NOTE — Telephone Encounter (Signed)
She could take the toprol XL 12.5 mg tablet twice daily - morning and night.  Dr. Lemmie Evens

## 2014-07-25 ENCOUNTER — Encounter: Payer: Self-pay | Admitting: Internal Medicine

## 2014-07-26 NOTE — Telephone Encounter (Signed)
Dr. Debara Pickett spoke with patient Friday 4/8

## 2014-08-18 ENCOUNTER — Encounter: Payer: Self-pay | Admitting: Internal Medicine

## 2014-08-18 ENCOUNTER — Ambulatory Visit (INDEPENDENT_AMBULATORY_CARE_PROVIDER_SITE_OTHER): Payer: BC Managed Care – PPO | Admitting: Internal Medicine

## 2014-08-18 VITALS — BP 142/104 | HR 66 | Ht 62.0 in | Wt 233.8 lb

## 2014-08-18 DIAGNOSIS — I480 Paroxysmal atrial fibrillation: Secondary | ICD-10-CM | POA: Diagnosis not present

## 2014-08-18 DIAGNOSIS — I10 Essential (primary) hypertension: Secondary | ICD-10-CM

## 2014-08-18 DIAGNOSIS — K219 Gastro-esophageal reflux disease without esophagitis: Secondary | ICD-10-CM

## 2014-08-18 DIAGNOSIS — Z6841 Body Mass Index (BMI) 40.0 and over, adult: Secondary | ICD-10-CM

## 2014-08-18 NOTE — Patient Instructions (Signed)
Dr Debara Pickett recommends that you schedule a follow-up appointment in 6 months. You will receive a reminder letter in the mail two months in advance. If you don't receive a letter, please call our office to schedule the follow-up appointment.

## 2014-08-18 NOTE — Progress Notes (Signed)
OFFICE NOTE  Chief Complaint:  Palpitations  Primary Care Physician: Terald Sleeper, PA-C  HPI:  Rebekah [name] is a pleasant 59 year old female with a past medical history significant for her super morbid obesity, chronic hypoxia, hypertension, GERD and prior knee replacement. She was previously followed by Dr. Rollene Fare and was being evaluated for bariatric surgery. She is here to establish new care with me today and for preoperative evaluation. 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 She appears he seen Dr. Rollene Fare will order an echocardiogram on August of 2014 which showed moderate thickening of the left ventricle with EF of 60-65%, mildly increased RV systolic pressure and no significant valvular disease. There was left atrial enlargement. She denies any chest pain but does have expected shortness of breath with exertion. Lower extremity venous Dopplers were performed which show no evidence of significant venous insufficiency.    Rebekah [name] returns today for follow-up. She seems to be doing remarkably well. She underwent gastric sleeve surgery and has had close to 60 pound weight loss. Unfortunately she was complicated by postoperative atrial fibrillation. She was seen by Dr. Martinique and placed on Cardizem. Subsequently she converted to sinus rhythm within 24 hours spontaneously. She's had no recurrence since that time and has not been additionally anticoagulated. She was not continued on Cardizem. She does have an elevated CHADSVASC score of 2.  I saw Rebekah [name] back in the office today. Please see her recent notes when I saw her in the emergency room at [facility]. She was noted to be in atrial fibrillation again and given flecainide to help convert her, but this was not successful. She is placed on diltiazem and in addition to metoprolol.  Rate slowed down and eventually she cardioverted on her own. We also started her on Xarelto which she's continues to take. Overall she's had very good rate control his heart rate is remained in the mid 40s to mid 60s. Blood pressure is somewhat labile and ranges between the low 90s up to the 140s. Overall she feels fairly well. She still has a few episodes of palpitations during the week when she feels that she is in A. fib although she feels like she is in A. fib today and her rhythm is sinus. She may not be sensitive to A. fib anymore. 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   PMHx:  Past Medical History  Diagnosis Date  . Arthritis   . Hypertension   . GERD (gastroesophageal reflux disease)   . Obesity   . Complication of anesthesia   . PONV (postoperative nausea and vomiting)   . Shortness of breath     WITH EXERTION - uses 02 3L nightly / 5 L  other times with exertion   . Environmental allergies   . Incontinence of urine   . Urgency of urination   . Difficulty sleeping     Past Surgical History  Procedure Laterality Date  . Tonsillectomy    . Cholecystectomy    . Appendectomy    . Tubal ligation    . Transthoracic echocardiogram  11/2012    EF 60-65%, mod LVH & mod conc hypertrophy, grade 1 diastolic dysfunction; LA mildly dilated; RV systolic pressure increased; PA peak pressure 3mmHg  . Joint replacement  1999    L TOTAL KNEE  .  Laparoscopic gastric sleeve resection N/A 01/04/2014    Procedure: LAPAROSCOPIC GASTRIC SLEEVE RESECTION;  Surgeon: Greer Pickerel, MD;  Location: [facility];  Service: General;  Laterality: N/A;  . Upper gi endoscopy  01/04/2014    Procedure: UPPER GI ENDOSCOPY;  Surgeon: Greer Pickerel, MD;  Location: [facility];  Service: General;;    FAMHx:  Family History  Problem Relation Age of Onset  . Diabetes Father   . Diabetes Maternal Grandmother   . Uterine cancer Mother   . Cervical cancer Mother   . Breast cancer Mother   . Cancer  Mother     cervical and brreast cancer    SOCHx:   reports that she has never smoked. She does not have any smokeless tobacco history on file. She reports that she does not drink alcohol or use illicit drugs.  ALLERGIES:  Allergies  Allergen Reactions  . Mucinex [Guaifenesin Er] Anaphylaxis  . Ace Inhibitors Swelling and Cough    Pedal Edema  . Propoxyphene Hcl     REACTION: hallucinate  . Sulfonamide Derivatives     REACTION: rash  . Hydromet [Hydrocodone-Homatropine] Hives and Rash    ROS: A comprehensive review of systems was negative except for: Constitutional: positive for weight loss Cardiovascular: positive for palpitations  HOME MEDS: Current Outpatient Prescriptions  Medication Sig Dispense Refill  . acetaminophen (TYLENOL) 500 MG tablet Take 500-1,000 mg by mouth every 6 (six) hours as needed for moderate pain or headache.    Marland Kitchen BIOTIN PO Take 1 tablet by mouth daily.    Marland Kitchen CALCIUM PO Take 500 mg by mouth daily.    . cetirizine (ZYRTEC) 10 MG tablet Take 10 mg by mouth at bedtime.     Marland Kitchen diltiazem (DILACOR XR) 120 MG 24 hr capsule Take 1 capsule (120 mg total) by mouth daily. 30 capsule 0  . docusate sodium (COLACE) 100 MG capsule Take 100 mg by mouth daily as needed for mild constipation.    Marland Kitchen EPINEPHrine (EPIPEN) 0.3 mg/0.3 mL DEVI Inject 0.3 mg into the muscle once.    . escitalopram (LEXAPRO) 20 MG tablet Take 20 mg by mouth every morning.     . furosemide (LASIX) 40 MG tablet Take 20-40 mg by mouth daily as needed for fluid or edema.     Marland Kitchen HYDROcodone-acetaminophen (NORCO) 10-325 MG per tablet Take 0.5 tablets by mouth as needed for moderate pain.     Marland Kitchen KLOR-CON M20 20 MEQ tablet Take 10-20 mEq by mouth daily as needed (when takes lasix).     Marland Kitchen levothyroxine (SYNTHROID, LEVOTHROID) 50 MCG tablet Take 50 mcg by mouth daily before breakfast.    . metoprolol succinate (TOPROL-XL) 25 MG 24 hr tablet Take 0.5 tablets (12.5 mg total) by mouth daily. 15 tablet 11  .  Multiple Vitamins-Minerals (MULTIVITAMIN ADULT PO) Take 3 capsules by mouth 2 (two) times daily. Journey 3+3 Bariatric Multivitamin    . OVER THE COUNTER MEDICATION Journey 3+3 Multivitamin, take 1 tablet daily.    . OXYGEN Inhale 5 L/min into the lungs continuous as needed (when active). 3 liters nightly    . rivaroxaban (XARELTO) 20 MG TABS tablet Take 1 tablet (20 mg total) by mouth daily with supper. 30 tablet 6  . solifenacin (VESICARE) 10 MG tablet Take 10 mg by mouth every morning.     No current facility-administered medications for this visit.    LABS/IMAGING: No results found for this or any previous visit (from the past 48 hour(s)). No results  found.  VITALS: BP 142/104 mmHg  Pulse 66  Ht 5\' 2"  (1.575 m)  Wt 233 lb 12.8 oz (106.051 kg)  BMI 42.75 kg/m2  EXAM: General appearance: alert, no distress and morbidly obese Neck: no carotid bruit and no JVD Lungs: diminished breath sounds bilaterally Heart: regular rate and rhythm, S1, S2 normal, no murmur, click, rub or gallop Abdomen: morbidly obese Extremities: extremities normal, atraumatic, no cyanosis or edema Pulses: 2+ and symmetric Skin: Skin color, texture, turgor normal. No rashes or lesions Neurologic: Grossly normal Psych: Mood, affect normal  EKG:  normal sinus rhythm at 66  ASSESSMENT: 1. Marked weight loss s/p bariatric surgery 2. Super morbid obesity 3. Hypoxia secondary to #2 4. Hypertension 5. Paroxysmal atrial fibrillation  PLAN: 1.    Rebekah [name] has had recurrent atrial fibrillation. This did not respond to single dose flecainide. She is now on diltiazem and metoprolol for rate control. She's taking Xarelto for anticoagulation. Overall her episodes are very infrequent. She notices them but is not particular bothered by them. I would not recommend antiarrhythmic therapy at this time. She should continue work on weight loss. I do not feel that she would be disabled from a cardiac standpoint with her  atrial fibrillation, however her weight and any orthopedic issues may ultimately make her disabled. Given her labile blood pressures, I've asked her to take her metoprolol at night and diltiazem in the morning, rather than take all of her medicines at the same time. Hopefully spacing this out may help a little bit with her lower heart rates and labile blood pressures.  Plan to see her back in 6 months.  Pixie Casino, MD, [facility] Attending Cardiologist [place] 08/18/2014, 6:10 PM

## 2014-08-19 ENCOUNTER — Telehealth: Payer: Self-pay | Admitting: Internal Medicine

## 2014-08-19 ENCOUNTER — Other Ambulatory Visit: Payer: Self-pay | Admitting: *Deleted

## 2014-08-19 MED ORDER — RIVAROXABAN 20 MG PO TABS: 20.0000 mg | ORAL_TABLET | Freq: Every day | ORAL | Status: DC

## 2014-08-19 MED ORDER — DILTIAZEM HCL ER 120 MG PO CP24: 6.0000 mg | ORAL_CAPSULE | Freq: Every day | ORAL | Status: DC

## 2014-08-19 MED ORDER — DILTIAZEM HCL ER 120 MG PO CP24: 120.0000 mg | ORAL_CAPSULE | Freq: Every day | ORAL | Status: DC

## 2014-08-19 NOTE — Telephone Encounter (Signed)
°  1. Which medications need to be refilled? Xarelto and Diltiaz ER -new ptescription2. Which pharmacy is medication to be sent to?[facility]-[id number]  3. Do they need a 30 day or 90 day supply? 90 and refills 4. Would they like a call back once the medication has been sent to the pharmacy? no

## 2014-08-19 NOTE — Telephone Encounter (Signed)
Refill submitted to patient's preferred pharmacy.  

## 2014-09-24 ENCOUNTER — Ambulatory Visit: Payer: BC Managed Care – PPO | Admitting: Physical Therapy

## 2014-10-27 ENCOUNTER — Ambulatory Visit: Payer: BC Managed Care – PPO | Attending: Physician Assistant

## 2014-10-27 DIAGNOSIS — M79641 Pain in right hand: Secondary | ICD-10-CM

## 2014-10-27 DIAGNOSIS — M25551 Pain in right hip: Secondary | ICD-10-CM | POA: Diagnosis present

## 2014-10-27 DIAGNOSIS — M25572 Pain in left ankle and joints of left foot: Secondary | ICD-10-CM

## 2014-10-27 DIAGNOSIS — M25562 Pain in left knee: Secondary | ICD-10-CM | POA: Diagnosis present

## 2014-10-27 DIAGNOSIS — M6289 Other specified disorders of muscle: Secondary | ICD-10-CM | POA: Insufficient documentation

## 2014-10-27 DIAGNOSIS — M25561 Pain in right knee: Secondary | ICD-10-CM

## 2014-10-27 DIAGNOSIS — R29898 Other symptoms and signs involving the musculoskeletal system: Secondary | ICD-10-CM

## 2014-10-27 NOTE — Therapy (Addendum)
[place], [place], [address] Phone: [phone]   Fax:  [phone]  Physical Therapy Evaluation  Patient Details  Name: Rebekah [name] MRN: [id number] Date of Birth: [birth year] Referring Provider:  Terald Sleeper, PA-C  Encounter Date: 10/27/2014      PT End of Session - 10/27/14 1259    Visit Number 1   PT Start Time 7017   PT Stop Time 7939   PT Time Calculation (min) 261 min   Activity Tolerance Patient limited by pain;Patient tolerated treatment well   Behavior During Therapy [facility] for tasks assessed/performed      Past Medical History  Diagnosis Date  . Arthritis   . Hypertension   . GERD (gastroesophageal reflux disease)   . Obesity   . Complication of anesthesia   . PONV (postoperative nausea and vomiting)   . Shortness of breath     WITH EXERTION - uses 02 3L nightly / 5 L  other times with exertion   . Environmental allergies   . Incontinence of urine   . Urgency of urination   . Difficulty sleeping     Past Surgical History  Procedure Laterality Date  . Tonsillectomy    . Cholecystectomy    . Appendectomy    . Tubal ligation    . Transthoracic echocardiogram  11/2012    EF 60-65%, mod LVH & mod conc hypertrophy, grade 1 diastolic dysfunction; LA mildly dilated; RV systolic pressure increased; PA peak pressure 40mHg  . Joint replacement  1999    L TOTAL KNEE  . Laparoscopic gastric sleeve resection N/A 01/04/2014    Procedure: LAPAROSCOPIC GASTRIC SLEEVE RESECTION;  Surgeon: EGreer Pickerel MD;  Location: [facility];  Service: General;  Laterality: N/A;  . Upper gi endoscopy  01/04/2014    Procedure: UPPER GI ENDOSCOPY;  Surgeon: EGreer Pickerel MD;  Location: [facility];  Service: General;;    There were no vitals filed for this visit.  Visit Diagnosis:  Arthralgia of both knees - Plan: PT plan of care cert/re-cert  Pain of right hand - Plan: PT plan of care cert/re-cert  Pain, hip, right  - Plan: PT plan of care cert/re-cert  Pain in joint, ankle and foot, left - Plan: PT plan of care cert/re-cert  Weakness of both hips - Plan: PT plan of care cert/re-cert      She completed the FCE process with rating of light level of work. See scanned FCE test for results.                                    Plan - 10/27/14 1301    Clinical Impression Statement She completed the FCE process with rating of light level of work   PT Next Visit Plan Discharge to MD this visit   Consulted and Agree with Plan of Care Patient         Problem List Patient Active Problem List   Diagnosis Date Noted  . Paroxysmal a-fib 07/20/2014  . Prediabetes 01/06/2014  . Dyslipidemia 01/06/2014  . OA (osteoarthritis) 01/06/2014  . S/P laparoscopic sleeve gastrectomy 01/04/14 01/06/2014  . Postoperative atrial fibrillation - resolved 01/06/2014  . Morbid obesity 01/04/2014  . Morbid obesity with BMI of 60.0-69.9, adult 10/28/2013  . Dyspnea on exertion 09/05/2012  . Essential hypertension 04/21/2007  . G E R D 04/21/2007    CDarrel Hoover  PT 10/27/2014, 1:05 PM  [place], [place], [address] Phone: [phone]   Fax:  [phone]    PHYSICAL THERAPY DISCHARGE SUMMARY  Visits from Start of Care: EVAL only  Current functional level related to goals / functional outcomes: NA   Remaining deficits: NA   Education / Equipment: NA Plan: Patient agrees to discharge.  Patient goals were met. Patient is being discharged due to                                                     ?????   Conpleteing the FCE process

## 2015-02-17 ENCOUNTER — Telehealth: Payer: Self-pay | Admitting: Internal Medicine

## 2015-02-17 NOTE — Telephone Encounter (Signed)
Pt called in stating that she had a really bad nose bleed last night that lasted a couple of hours. She thinks that her oxygen might have caused this. But she wanted to be sure that it had nothing to do her Xarelto prescription. Please f/u with her  Thanks

## 2015-02-17 NOTE — Telephone Encounter (Signed)
Recommendations given to patient, who voiced understanding and agreement w/ recommendations. Pt advised to call for any further needs.

## 2015-02-17 NOTE — Telephone Encounter (Signed)
Should use nasal saline spray once or twice daily. Agree with humidified oxygen. For minor nosebleeds, pinch nose and hold for 20 minutes - no blowing, lean head forward, so blood doesn't drain down the throat. Could also use Afrin, which constricts blood vessels and may help - but don't use it regularly.  Serious bleeds that don't stop, would be a need to go to the ER.  Xarelto is not the cause of this, but makes the blood thinner when there is bleeding.  Dr. Lemmie Evens

## 2015-02-17 NOTE — Telephone Encounter (Signed)
Spoke to this patient. She reports intermittent epistaxis for "a while", notes awareness that her nighttime O2 makes nostrils dry.  She states she had a large bleed yesterday that did not resolve for 2+ hours. This began after she sneezed. She knows Xarelto makes it more difficult for the bleed to stop. Filled several tissues w/ blood, still can feel plug in nose this morning from congealed blood.  She asked for recommendations - no longer seeing pulmonology. Pt used to use humidifed O2, but was having problems w/ water from tank getting into cannulae. Asked if vaseline OK to use around nostrils. Advised caution w/ this as concern for flammability. Should be fine to use in AM once off nighttime O2.  Advised if she has return of bleeds prolonged for more than an hour, go to urgent care. She states no UC w/in hour of her that are open after hours - bleed occurred last night and she was reluctant to go to ED, thought problem too trivial to address there. Advised ED would be appropriate w/ prolonged bleed.  While on phone, her nose began to bleed again.  Advised UC today, will route to Dr. Debara Pickett for further considerations, pt voiced agreement w/ this plan.

## 2015-02-17 NOTE — Telephone Encounter (Signed)
Left message for patient to call to discuss recommendations.

## 2015-02-23 ENCOUNTER — Other Ambulatory Visit: Payer: Self-pay | Admitting: Internal Medicine

## 2015-03-01 ENCOUNTER — Encounter: Payer: Self-pay | Admitting: Internal Medicine

## 2015-03-01 ENCOUNTER — Ambulatory Visit (INDEPENDENT_AMBULATORY_CARE_PROVIDER_SITE_OTHER): Payer: BC Managed Care – PPO | Admitting: Internal Medicine

## 2015-03-01 VITALS — BP 124/76 | HR 70 | Ht 62.0 in | Wt 188.2 lb

## 2015-03-01 DIAGNOSIS — I4891 Unspecified atrial fibrillation: Secondary | ICD-10-CM | POA: Diagnosis not present

## 2015-03-01 DIAGNOSIS — I10 Essential (primary) hypertension: Secondary | ICD-10-CM | POA: Diagnosis not present

## 2015-03-01 DIAGNOSIS — R0902 Hypoxemia: Secondary | ICD-10-CM

## 2015-03-01 DIAGNOSIS — I9789 Other postprocedural complications and disorders of the circulatory system, not elsewhere classified: Secondary | ICD-10-CM

## 2015-03-01 DIAGNOSIS — Z6841 Body Mass Index (BMI) 40.0 and over, adult: Secondary | ICD-10-CM

## 2015-03-01 DIAGNOSIS — R0609 Other forms of dyspnea: Secondary | ICD-10-CM

## 2015-03-01 DIAGNOSIS — Z9884 Bariatric surgery status: Secondary | ICD-10-CM

## 2015-03-01 NOTE — Patient Instructions (Signed)
Dr. Debara Pickett has ordered an [place] - this will be arranged thru [facility] wants you to follow-up in: 6 months with Dr. Debara Pickett. You will receive a reminder letter in the mail two months in advance. If you don't receive a letter, please call our office to schedule the follow-up appointment.

## 2015-03-02 NOTE — Progress Notes (Signed)
OFFICE NOTE  Chief Complaint:  No complaints  Primary Care Physician: Terald Sleeper, PA-C  HPI:  Rebekah [name] is a pleasant 59 year old female with a past medical history significant for her super morbid obesity, chronic hypoxia, hypertension, GERD and prior knee replacement. She was previously followed by Dr. Rollene Fare and was being evaluated for bariatric surgery. She is here to establish new care with me today and for preoperative evaluation. 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 She appears he seen Dr. Rollene Fare will order an echocardiogram on August of 2014 which showed moderate thickening of the left ventricle with EF of 60-65%, mildly increased RV systolic pressure and no significant valvular disease. There was left atrial enlargement. She denies any chest pain but does have expected shortness of breath with exertion. Lower extremity venous Dopplers were performed which show no evidence of significant venous insufficiency.    Rebekah [name] returns today for follow-up. She seems to be doing remarkably well. She underwent gastric sleeve surgery and has had close to 60 pound weight loss. Unfortunately she was complicated by postoperative atrial fibrillation. She was seen by Dr. Martinique and placed on Cardizem. Subsequently she converted to sinus rhythm within 24 hours spontaneously. She's had no recurrence since that time and has not been additionally anticoagulated. She was not continued on Cardizem. She does have an elevated CHADSVASC score of 2.  I saw Rebekah [name] back in the office today. Please see her recent notes when I saw her in the emergency room at [facility]. She was noted to be in atrial fibrillation again and given flecainide to help convert her, but this was not successful. She is placed on diltiazem and in addition to metoprolol.  Rate slowed down and eventually she cardioverted on her own. We also started her on Xarelto which she's continues to take. Overall she's had very good rate control his heart rate is remained in the mid 40s to mid 60s. Blood pressure is somewhat labile and ranges between the low 90s up to the 140s. Overall she feels fairly well. She still has a few episodes of palpitations during the week when she feels that she is in A. fib although she feels like she is in A. fib today and her rhythm is sinus. She may not be sensitive to A. fib anymore. 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  I saw Rebekah [name] back in the office today. She's had remarkable weight loss. In fact her weight after sleeve gastrectomy is down 288 pounds. Her main issue now is excess tissue for which she's tried to get plastic surgery but has been denied multiple times. She reports no further palpitations or atrial fibrillation. She continues to take Xarelto without any bleeding complications. She continues to use nocturnal oxygen due to hypoxia, although her sleep study indicated no sleep apnea, her overnight oximetry study did indicate hypoxia with a low O2 sats ration of 86%. This was, of course prior to the recent 80-100 pound weight loss. She also reports recently she's been having epistaxis. In fact she had an episode that lasted for 10-12 hours, she used a box of 40 micro-tampons to try to get it to stop and then eventually tried to go to the emergency room. She is using nasal saline but feels that the oxygen at night is probably drying  her nose out.  PMHx:  Past Medical History  Diagnosis Date  . Arthritis   . Hypertension   . GERD (gastroesophageal reflux disease)   . Obesity   . Complication of anesthesia   . PONV (postoperative nausea and vomiting)   . Shortness of breath     WITH EXERTION - uses 02 3L nightly / 5 L  other times with exertion   . Environmental allergies   . Incontinence of urine   .  Urgency of urination   . Difficulty sleeping     Past Surgical History  Procedure Laterality Date  . Tonsillectomy    . Cholecystectomy    . Appendectomy    . Tubal ligation    . Transthoracic echocardiogram  11/2012    EF 60-65%, mod LVH & mod conc hypertrophy, grade 1 diastolic dysfunction; LA mildly dilated; RV systolic pressure increased; PA peak pressure 76mmHg  . Joint replacement  1999    L TOTAL KNEE  . Laparoscopic gastric sleeve resection N/A 01/04/2014    Procedure: LAPAROSCOPIC GASTRIC SLEEVE RESECTION;  Surgeon: Greer Pickerel, MD;  Location: [facility];  Service: General;  Laterality: N/A;  . Upper gi endoscopy  01/04/2014    Procedure: UPPER GI ENDOSCOPY;  Surgeon: Greer Pickerel, MD;  Location: [facility];  Service: General;;    FAMHx:  Family History  Problem Relation Age of Onset  . Diabetes Father   . Diabetes Maternal Grandmother   . Uterine cancer Mother   . Cervical cancer Mother   . Breast cancer Mother   . Cancer Mother     cervical and brreast cancer    SOCHx:   reports that she has never smoked. She does not have any smokeless tobacco history on file. She reports that she does not drink alcohol or use illicit drugs.  ALLERGIES:  Allergies  Allergen Reactions  . Mucinex [Guaifenesin Er] Anaphylaxis  . Ace Inhibitors Swelling and Cough    Pedal Edema  . Codeine Nausea Only    Stomach Ache, face itches  . Propoxyphene Hcl     REACTION: hallucinate  . Sulfonamide Derivatives     REACTION: rash  . Hydromet [Hydrocodone-Homatropine] Hives and Rash    ROS: A comprehensive review of systems was negative except for: Constitutional: positive for weight loss  HOME MEDS: Current Outpatient Prescriptions  Medication Sig Dispense Refill  . acetaminophen (TYLENOL) 500 MG tablet Take 500-1,000 mg by mouth every 6 (six) hours as needed for moderate pain or headache.    Marland Kitchen BIOTIN PO Take 5,000 mcg by mouth every other day.     Marland Kitchen CALCIUM PO Take 500 mg by mouth daily.     . cetirizine (ZYRTEC) 10 MG tablet Take 10 mg by mouth at bedtime.     Marland Kitchen diltiazem (DILACOR XR) 120 MG 24 hr capsule Take 1 capsule (120 mg total) by mouth daily. 90 capsule 3  . docusate sodium (COLACE) 100 MG capsule Take 100 mg by mouth daily as needed for mild constipation.    Marland Kitchen EPINEPHrine (EPIPEN) 0.3 mg/0.3 mL DEVI Inject 0.3 mg into the muscle once.    . escitalopram (LEXAPRO) 20 MG tablet Take 20 mg by mouth every morning.     . furosemide (LASIX) 40 MG tablet Take 20-40 mg by mouth daily as needed for fluid or edema.     Marland Kitchen KLOR-CON M20 20 MEQ tablet Take 10-20 mEq by mouth daily as needed (when takes lasix).     Marland Kitchen levothyroxine (SYNTHROID,  LEVOTHROID) 50 MCG tablet Take 50 mcg by mouth daily before breakfast.    . metoprolol succinate (TOPROL-XL) 25 MG 24 hr tablet Take 0.5 tablets (12.5 mg total) by mouth daily. 15 tablet 5  . Multiple Vitamins-Minerals (MULTIVITAMIN ADULT PO) Take 3 capsules by mouth 2 (two) times daily. Journey 3+3 Bariatric Multivitamin    . OXYGEN Inhale 5 L/min into the lungs continuous as needed (when active). 3 liters nightly    . rivaroxaban (XARELTO) 20 MG TABS tablet Take 1 tablet (20 mg total) by mouth daily with supper. 90 tablet 3  . tolterodine (DETROL LA) 4 MG 24 hr capsule Take 4 mg by mouth daily.    . traMADol (ULTRAM) 50 MG tablet Take 50 mg by mouth every 6 (six) hours as needed.     No current facility-administered medications for this visit.    LABS/IMAGING: No results found for this or any previous visit (from the past 48 hour(s)). No results found.  VITALS: BP 124/76 mmHg  Pulse 70  Ht 5\' 2"  (1.575 m)  Wt 188 lb 3.2 oz (85.367 kg)  BMI 34.41 kg/m2  EXAM: General appearance: alert, no distress and moderately obese Neck: no carotid bruit and no JVD Lungs: diminished breath sounds bilaterally Heart: regular rate and rhythm, S1, S2 normal, no murmur, click, rub or gallop Abdomen: obese Extremities: extremities normal, atraumatic, no  cyanosis or edema Pulses: 2+ and symmetric Skin: Skin color, texture, turgor normal. No rashes or lesions Neurologic: Grossly normal Psych: Mood, affect normal  EKG:  normal sinus rhythm at 70  ASSESSMENT: 1. Marked weight loss s/p bariatric surgery 2. Super morbid obesity - improving 3. Hypoxia secondary to #2 (on nocturnal oxygen) 4. Recurrent epistaxis 5. Hypertension 6. Paroxysmal atrial fibrillation - post-op, no recurrence  PLAN: 1.    Rebekah [name] has been experiencing epistaxis which is been recurrent and significant. She does need to stay on Xarelto for stroke risk reduction secondary to A. fib. I'm doubtful that she would need oxygen at night if her sleep study was negative for obstructive sleep apnea. Her hypoxia seem to be related to her obesity which is decreasing significantly after surgery. I like to repeat her nocturnal oxygen study off of oxygen and see if she really needs to continue on it. Hopefully we can discontinue it that would reduce her likelihood of getting epistaxis. Should she continue to have symptoms, I would refer her to an ear nose and throat specialist.  Plan to see her back in 6 months.  Pixie Casino, MD, [facility] Attending Cardiologist [place] 03/02/2015, 6:34 PM

## 2015-03-26 ENCOUNTER — Encounter (HOSPITAL_COMMUNITY): Payer: Self-pay | Admitting: Emergency Medicine

## 2015-03-26 ENCOUNTER — Emergency Department (HOSPITAL_COMMUNITY)
Admission: EM | Admit: 2015-03-26 | Discharge: 2015-03-26 | Disposition: A | Discharge status: Home / Self Care | Payer: BC Managed Care – PPO | Attending: Emergency Medicine | Admitting: Emergency Medicine

## 2015-03-26 DIAGNOSIS — E669 Obesity, unspecified: Secondary | ICD-10-CM | POA: Diagnosis not present

## 2015-03-26 DIAGNOSIS — R7989 Other specified abnormal findings of blood chemistry: Secondary | ICD-10-CM | POA: Diagnosis not present

## 2015-03-26 DIAGNOSIS — Z8669 Personal history of other diseases of the nervous system and sense organs: Secondary | ICD-10-CM | POA: Diagnosis not present

## 2015-03-26 DIAGNOSIS — Z7901 Long term (current) use of anticoagulants: Secondary | ICD-10-CM | POA: Insufficient documentation

## 2015-03-26 DIAGNOSIS — R799 Abnormal finding of blood chemistry, unspecified: Secondary | ICD-10-CM

## 2015-03-26 DIAGNOSIS — M1990 Unspecified osteoarthritis, unspecified site: Secondary | ICD-10-CM | POA: Insufficient documentation

## 2015-03-26 DIAGNOSIS — I480 Paroxysmal atrial fibrillation: Secondary | ICD-10-CM | POA: Diagnosis not present

## 2015-03-26 DIAGNOSIS — I10 Essential (primary) hypertension: Secondary | ICD-10-CM | POA: Diagnosis not present

## 2015-03-26 DIAGNOSIS — Z79899 Other long term (current) drug therapy: Secondary | ICD-10-CM | POA: Diagnosis not present

## 2015-03-26 DIAGNOSIS — R002 Palpitations: Secondary | ICD-10-CM | POA: Diagnosis present

## 2015-03-26 DIAGNOSIS — Z8719 Personal history of other diseases of the digestive system: Secondary | ICD-10-CM | POA: Diagnosis not present

## 2015-03-26 LAB — BASIC METABOLIC PANEL
Anion gap: 10 (ref 5–15)
BUN: 27 mg/dL — AB (ref 6–20)
CO2: 27 mmol/L (ref 22–32)
Calcium: 10.3 mg/dL (ref 8.9–10.3)
Chloride: 104 mmol/L (ref 101–111)
Creatinine, Ser: 0.8 mg/dL (ref 0.44–1.00)
GFR calc Af Amer: >60 mL/min (ref >60)
GLUCOSE: 168 mg/dL — AB (ref 65–99)
Potassium: 3.7 mmol/L (ref 3.5–5.1)
SODIUM: 141 mmol/L (ref 135–145)

## 2015-03-26 LAB — CBC WITH DIFFERENTIAL/PLATELET
BASOS ABS: 0.1 10*3/uL (ref 0.0–0.1)
BASOS PCT: 1 %
EOS PCT: 4 %
Eosinophils Absolute: 0.3 10*3/uL (ref 0.0–0.7)
HCT: 42.2 % (ref 36.0–46.0)
Hemoglobin: 13.3 g/dL (ref 12.0–15.0)
Lymphocytes Relative: 41 %
Lymphs Abs: 2.8 10*3/uL (ref 0.7–4.0)
MCH: 29 pg (ref 26.0–34.0)
MCHC: 31.5 g/dL (ref 30.0–36.0)
MCV: 92.1 fL (ref 78.0–100.0)
MONO ABS: 0.8 10*3/uL (ref 0.1–1.0)
Monocytes Relative: 11 %
NEUTROS ABS: 2.9 10*3/uL (ref 1.7–7.7)
Neutrophils Relative %: 43 %
PLATELETS: 193 10*3/uL (ref 150–400)
RBC: 4.58 MIL/uL (ref 3.87–5.11)
RDW: 14.1 % (ref 11.5–15.5)
WBC: 6.8 10*3/uL (ref 4.0–10.5)

## 2015-03-26 MED ORDER — FLECAINIDE ACETATE 100 MG PO TABS
300.0000 mg | ORAL_TABLET | Freq: Once | ORAL | Status: DC
  Filled 2015-03-26: qty 3

## 2015-03-26 MED ORDER — DILTIAZEM HCL 100 MG IV SOLR
5.0000 mg/h | INTRAVENOUS | Status: DC
  Filled 2015-03-26: qty 100

## 2015-03-26 MED ORDER — DILTIAZEM LOAD VIA INFUSION
15.0000 mg | Freq: Once | INTRAVENOUS | Status: DC
  Filled 2015-03-26: qty 15

## 2015-03-26 NOTE — Discharge Instructions (Signed)

## 2015-03-26 NOTE — ED Notes (Signed)
MD at bedside. 

## 2015-03-26 NOTE — ED Notes (Signed)
Pt c/o palpitations onset @2100 . Pt has hx of afib post op last year. Denies pain.

## 2015-03-26 NOTE — ED Provider Notes (Addendum)
CSN: [id number]     Arrival date & time 03/26/15  0252 History  By signing my name below, I, Emmanuella Mensah, attest that this documentation has been prepared under the direction and in the presence of Delora Fuel, MD. Electronically Signed: Judithann Sauger, ED Scribe. 03/26/2015. 3:42 AM.    Chief Complaint  Patient presents with  . Palpitations   The history is provided by the patient. No language interpreter was used.   HPI Comments: Rebekah [name] is a 59 y.o. female with a hx of HTN, SOB, and Afib who presents to the Emergency Department complaining of ongoing moderate episodes of irregular palpitations onset 9 pm last night. She reports associated jitters and a "pinching feeling". She denies any SOB or chest pain. She states that she took her Xarelto last night and her Diltiazem last am. She notes that she is currenlty on night home O2 to help her sleep. She reports that she has a hx of Afib s/p her bariatric surgery last year. She states that her HR was over 200 at that time and was sent home. She adds that did not have to have the cardioversion because she had flipped before her appointment. She reports that she was seen here in April for similar symptoms and she was in Afib.    Past Medical History  Diagnosis Date  . Arthritis   . Hypertension   . GERD (gastroesophageal reflux disease)   . Obesity   . Complication of anesthesia   . PONV (postoperative nausea and vomiting)   . Shortness of breath     WITH EXERTION - uses 02 3L nightly / 5 L  other times with exertion   . Environmental allergies   . Incontinence of urine   . Urgency of urination   . Difficulty sleeping    Past Surgical History  Procedure Laterality Date  . Tonsillectomy    . Cholecystectomy    . Appendectomy    . Tubal ligation    . Transthoracic echocardiogram  11/2012    EF 60-65%, mod LVH & mod conc hypertrophy, grade 1 diastolic dysfunction; LA mildly dilated; RV systolic pressure increased; PA peak  pressure 13mmHg  . Joint replacement  1999    L TOTAL KNEE  . Laparoscopic gastric sleeve resection N/A 01/04/2014    Procedure: LAPAROSCOPIC GASTRIC SLEEVE RESECTION;  Surgeon: Greer Pickerel, MD;  Location: [facility];  Service: General;  Laterality: N/A;  . Upper gi endoscopy  01/04/2014    Procedure: UPPER GI ENDOSCOPY;  Surgeon: Greer Pickerel, MD;  Location: [facility];  Service: General;;   Family History  Problem Relation Age of Onset  . Diabetes Father   . Diabetes Maternal Grandmother   . Uterine cancer Mother   . Cervical cancer Mother   . Breast cancer Mother   . Cancer Mother     cervical and brreast cancer   Social History  Substance Use Topics  . Smoking status: Never Smoker   . Smokeless tobacco: None  . Alcohol Use: No   OB History    No data available     Review of Systems  Cardiovascular: Positive for palpitations. Negative for chest pain.  Gastrointestinal: Negative for nausea and vomiting.  All other systems reviewed and are negative.     Allergies  Mucinex; Ace inhibitors; Codeine; Propoxyphene hcl; Sulfonamide derivatives; and Hydromet  Home Medications   Prior to Admission medications   Medication Sig Start Date End Date Taking? Authorizing Provider  acetaminophen (TYLENOL) 500  MG tablet Take 500-1,000 mg by mouth every 6 (six) hours as needed for moderate pain or headache.    Historical Provider, MD  BIOTIN PO Take 5,000 mcg by mouth every other day.     Historical Provider, MD  CALCIUM PO Take 500 mg by mouth daily.    Historical Provider, MD  cetirizine (ZYRTEC) 10 MG tablet Take 10 mg by mouth at bedtime.     Historical Provider, MD  diltiazem (DILACOR XR) 120 MG 24 hr capsule Take 1 capsule (120 mg total) by mouth daily. 08/19/14   Pixie Casino, MD  docusate sodium (COLACE) 100 MG capsule Take 100 mg by mouth daily as needed for mild constipation.    Historical Provider, MD  EPINEPHrine (EPIPEN) 0.3 mg/0.3 mL DEVI Inject 0.3 mg into the muscle once.     Historical Provider, MD  escitalopram (LEXAPRO) 20 MG tablet Take 20 mg by mouth every morning.     Historical Provider, MD  furosemide (LASIX) 40 MG tablet Take 20-40 mg by mouth daily as needed for fluid or edema.  12/03/13   Historical Provider, MD  KLOR-CON M20 20 MEQ tablet Take 10-20 mEq by mouth daily as needed (when takes lasix).  11/04/13   Historical Provider, MD  levothyroxine (SYNTHROID, LEVOTHROID) 50 MCG tablet Take 50 mcg by mouth daily before breakfast.    Historical Provider, MD  metoprolol succinate (TOPROL-XL) 25 MG 24 hr tablet Take 0.5 tablets (12.5 mg total) by mouth daily. 02/24/15   Pixie Casino, MD  Multiple Vitamins-Minerals (MULTIVITAMIN ADULT PO) Take 3 capsules by mouth 2 (two) times daily. Journey 3+3 Bariatric Multivitamin    Historical Provider, MD  OXYGEN Inhale 5 L/min into the lungs continuous as needed (when active). 3 liters nightly    Historical Provider, MD  rivaroxaban (XARELTO) 20 MG TABS tablet Take 1 tablet (20 mg total) by mouth daily with supper. 08/19/14   Pixie Casino, MD  tolterodine (DETROL LA) 4 MG 24 hr capsule Take 4 mg by mouth daily.    Historical Provider, MD  traMADol (ULTRAM) 50 MG tablet Take 50 mg by mouth every 6 (six) hours as needed.    Historical Provider, MD   BP 112/71 mmHg  Pulse 69  Temp(Src) 98.4 F (36.9 C) (Oral)  Resp 22  SpO2 99% Physical Exam  Constitutional: She is oriented to person, place, and time. She appears well-developed and well-nourished. No distress.  HENT:  Head: Normocephalic and atraumatic.  Eyes: Conjunctivae and EOM are normal.  Neck: Neck supple. No tracheal deviation present.  Cardiovascular: Normal rate.   Tachycardiac and irregular  Pulmonary/Chest: Effort normal. No respiratory distress.  Musculoskeletal: Normal range of motion.  Neurological: She is alert and oriented to person, place, and time.  Skin: Skin is warm and dry.  Psychiatric: She has a normal mood and affect. Her behavior is  normal.  Nursing note and vitals reviewed.   ED Course  Procedures (including critical care time) DIAGNOSTIC STUDIES: Oxygen Saturation is 99% on RA, normal by my interpretation.    COORDINATION OF CARE: 3:40 AM- Pt advised of plan for treatment and pt agrees. Will try flecainide here in the ER to monitor pt's progress on medication.    Labs Review Results for orders placed or performed during the hospital encounter of [id number]  Basic metabolic panel  Result Value Ref Range   Sodium 141 135 - 145 mmol/L   Potassium 3.7 3.5 - 5.1 mmol/L   Chloride 104 101 -  111 mmol/L   CO2 27 22 - 32 mmol/L   Glucose, Bld 168 (H) 65 - 99 mg/dL   BUN 27 (H) 6 - 20 mg/dL   Creatinine, Ser 0.80 0.44 - 1.00 mg/dL   Calcium 10.3 8.9 - 10.3 mg/dL   GFR calc non Af Amer >60 >60 mL/min   GFR calc Af Amer >60 >60 mL/min   Anion gap 10 5 - 15  CBC with Differential  Result Value Ref Range   WBC 6.8 4.0 - 10.5 K/uL   RBC 4.58 3.87 - 5.11 MIL/uL   Hemoglobin 13.3 12.0 - 15.0 g/dL   HCT 42.2 36.0 - 46.0 %   MCV 92.1 78.0 - 100.0 fL   MCH 29.0 26.0 - 34.0 pg   MCHC 31.5 30.0 - 36.0 g/dL   RDW 14.1 11.5 - 15.5 %   Platelets 193 150 - 400 K/uL   Neutrophils Relative % 43 %   Neutro Abs 2.9 1.7 - 7.7 K/uL   Lymphocytes Relative 41 %   Lymphs Abs 2.8 0.7 - 4.0 K/uL   Monocytes Relative 11 %   Monocytes Absolute 0.8 0.1 - 1.0 K/uL   Eosinophils Relative 4 %   Eosinophils Absolute 0.3 0.0 - 0.7 K/uL   Basophils Relative 1 %   Basophils Absolute 0.1 0.0 - 0.1 K/uL    Delora Fuel, MD has personally reviewed and evaluated these lab results as part of his medical decision-making.   EKG Interpretation   Date/Time:  Saturday March 26 2015 03:01:18 EST Ventricular Rate:  154 PR Interval:    QRS Duration: 95 QT Interval:  271 QTC Calculation: 434 R Axis:   69 Text Interpretation:  Atrial fibrillation Repolarization abnormality, prob  rate related Baseline wander in lead(s) I II aVR aVL V1 V2 V3  V4 V5 V6  When compared with ECG of 07/20/2014, HEART RATE has increased Confirmed by  [facility]  MD, Yancarlos Berthold ([id number]) on 03/26/2015 3:14:02 AM        EKG Interpretation   Date/Time:  Saturday March 26 2015 04:08:41 EST Ventricular Rate:  72 PR Interval:  190 QRS Duration: 100 QT Interval:  394 QTC Calculation: 431 R Axis:   63 Text Interpretation:  Sinus rhythm Atrial premature complexes Otherwise  within normal limits When compared with ECG of 03/26/2015, Sinus rhythm  has replaced Atrial fibrillation with rapid ventricular response Confirmed  by [facility]  MD, Vernadine Coombs ([id number]) on 03/26/2015 4:21:13 AM      CRITICAL CARE Performed by: [id number] Total critical care time: 35 minutes Critical care time was exclusive of separately billable procedures and treating other patients. Critical care was necessary to treat or prevent imminent or life-threatening deterioration. Critical care was time spent personally by me on the following activities: development of treatment plan with patient and/or surrogate as well as nursing, discussions with consultants, evaluation of patient's response to treatment, examination of patient, obtaining history from patient or surrogate, ordering and performing treatments and interventions, ordering and review of laboratory studies, ordering and review of radiographic studies, pulse oximetry and re-evaluation of patient's condition.  MDM   Final diagnoses:  Paroxysmal atrial fibrillation (HCC)  Elevated BUN  Anticoagulant long-term use    Atrial fibrillation with rapid ventricular response. Old records are reviewed confirming prior ED visit for atrial fibrillation. At that time, she converted spontaneously prior to scheduled cardioversion. Intravenous diltiazem and oral flecainide were ordered, the patient converted to sinus rhythm prior to her getting any medications. Laboratory workup shows mild  elevation of BUN but not really significantly changed from baseline.  At this point, I feel she needs to discuss with her cardiologist whether she should be on medication to try to stay in sinus rhythm, and whether she should get a prescription for flecainide to use on an as-needed basis. She is discharged in good condition.  I personally performed the services described in this documentation, which was scribed in my presence. The recorded information has been reviewed and is accurate.      Delora Fuel, MD [id number]  Delora Fuel, MD [id number]

## 2015-03-26 NOTE — ED Notes (Signed)
Patient has converted to NSR, MD notified. Medications held at this time pending further orders from MD. EKG obtained.

## 2015-03-28 ENCOUNTER — Telehealth: Payer: Self-pay | Admitting: Internal Medicine

## 2015-03-28 ENCOUNTER — Ambulatory Visit (INDEPENDENT_AMBULATORY_CARE_PROVIDER_SITE_OTHER): Payer: BC Managed Care – PPO | Admitting: Physician Assistant

## 2015-03-28 ENCOUNTER — Ambulatory Visit: Payer: BC Managed Care – PPO | Admitting: Physician Assistant

## 2015-03-28 ENCOUNTER — Encounter: Payer: Self-pay | Admitting: Physician Assistant

## 2015-03-28 VITALS — BP 136/88 | HR 68 | Ht 62.0 in | Wt 185.5 lb

## 2015-03-28 DIAGNOSIS — R0609 Other forms of dyspnea: Secondary | ICD-10-CM | POA: Diagnosis not present

## 2015-03-28 DIAGNOSIS — R079 Chest pain, unspecified: Secondary | ICD-10-CM | POA: Diagnosis not present

## 2015-03-28 DIAGNOSIS — R011 Cardiac murmur, unspecified: Secondary | ICD-10-CM

## 2015-03-28 NOTE — Telephone Encounter (Signed)
Spoke to patient Appointment schedule for today  patient would like parameters on what to do PAF.

## 2015-03-28 NOTE — Progress Notes (Addendum)
Cardiology Office Note   Date:  03/28/2015   ID:  Rebekah [name], DOB 05-11-1955, MRN [id number]  PCP:  Terald Sleeper, PA-C  Cardiologist:   Dr. Debara Pickett (was Dr. Rollene Fare)  [place], [place]   Chief Complaint  Patient presents with  . Follow-up    ED--pt states she was in AFIB for 9 hrs//Friday night and yesterday she felt jittery, "pinching" in her chest , lasted less than minute.//no other Sx.//would like to know at what point she should be concerned when having an AFIB episode.    History of Present Illness: Rebekah [name] is a 59 y.o. female with a history of PAF, super morbid obesity ( peak weight 372 pounds, peak BMI 70.5) s/p gastric sleeve, HTN, GERD, L TKR, chronic hypoxia felt secondary to obesity hypoventilation.  Rebekah [name] presents for evaluation after an episode of atrial fibrillation associated with chest pain.   she was in her usual state of health Friday night when she had sudden onset of palpitations. At that time, she also had some pinching type of chest pain. The pain would reach a 3 or 4/10. The pain was in the upper left chest. She has had similar symptoms related to GERD, but this was in a different location. The symptoms resolved without intervention. When her A. Fib did not resolve , she went to the hospital and she spontaneously converted to sinus rhythm after being in atrial fibrillation about 9 hours total. This is longest episode she's had a long time.    She had another episode of chest pain yesterday that started without any obvious cause. She was not exerting herself and not having any palpitations. It also resolved in a few minutes without intervention.   She has gotten the atrial fibrillation intermittently since it first happened. The episodes will last 2 or 3 minutes and then resolve. She doesn't have very many of them in a year and can go a couple of months without one.    she does not have problems with fatigue. She is aware that her heart rate  is in the 40s when she is at rest and Notes that during her recent overnight oximetry, it would drop into the 30s at times. She is not aware of any symptoms that she has from this.   She has some dyspnea on exertion but feels this is secondary to the pain in her knee, possibly some deconditioning. She needs a right knee replacement and this is scheduled for the end of January.   Past Medical History  Diagnosis Date  . Arthritis   . Hypertension   . GERD (gastroesophageal reflux disease)   . Obesity   . Complication of anesthesia   . PONV (postoperative nausea and vomiting)   . Shortness of breath     WITH EXERTION - uses 02 3L nightly / 5 L  other times with exertion   . Environmental allergies   . Incontinence of urine   . Urgency of urination   . Difficulty sleeping     Past Surgical History  Procedure Laterality Date  . Tonsillectomy    . Cholecystectomy    . Appendectomy    . Tubal ligation    . Transthoracic echocardiogram  11/2012    EF 60-65%, mod LVH & mod conc hypertrophy, grade 1 diastolic dysfunction; LA mildly dilated; RV systolic pressure increased; PA peak pressure 64mmHg  . Joint replacement  1999    L TOTAL KNEE  . Laparoscopic  gastric sleeve resection N/A 01/04/2014    Procedure: LAPAROSCOPIC GASTRIC SLEEVE RESECTION;  Surgeon: Greer Pickerel, MD;  Location: [facility];  Service: General;  Laterality: N/A;  . Upper gi endoscopy  01/04/2014    Procedure: UPPER GI ENDOSCOPY;  Surgeon: Greer Pickerel, MD;  Location: [facility];  Service: General;;    Current Outpatient Prescriptions  Medication Sig Dispense Refill  . acetaminophen (TYLENOL) 500 MG tablet Take 500-1,000 mg by mouth every 6 (six) hours as needed for moderate pain or headache.    Marland Kitchen BIOTIN PO Take 5,000 mcg by mouth every other day.     Marland Kitchen CALCIUM PO Take 500 mg by mouth daily.    . cetirizine (ZYRTEC) 10 MG tablet Take 10 mg by mouth at bedtime.     Marland Kitchen diltiazem (DILACOR XR) 120 MG 24 hr capsule Take 1 capsule (120  mg total) by mouth daily. 90 capsule 3  . docusate sodium (COLACE) 100 MG capsule Take 100 mg by mouth daily as needed for mild constipation.    Marland Kitchen EPINEPHrine (EPIPEN) 0.3 mg/0.3 mL DEVI Inject 0.3 mg into the muscle once as needed (anaphylaxis).     Marland Kitchen escitalopram (LEXAPRO) 20 MG tablet Take 10 mg by mouth every morning.     . furosemide (LASIX) 40 MG tablet Take 20-40 mg by mouth daily as needed for fluid or edema.     Marland Kitchen KLOR-CON M20 20 MEQ tablet Take 10-20 mEq by mouth daily as needed (when takes lasix).     Marland Kitchen levothyroxine (SYNTHROID, LEVOTHROID) 50 MCG tablet Take 50 mcg by mouth daily before breakfast.    . metoprolol succinate (TOPROL-XL) 25 MG 24 hr tablet Take 0.5 tablets (12.5 mg total) by mouth daily. 15 tablet 5  . Multiple Vitamins-Minerals (MULTIVITAMIN ADULT PO) Take 3 capsules by mouth 2 (two) times daily. Journey 3+3 Bariatric Multivitamin    . OXYGEN Inhale 5 L/min into the lungs continuous as needed (when active). 3 liters nightly    . rivaroxaban (XARELTO) 20 MG TABS tablet Take 1 tablet (20 mg total) by mouth daily with supper. 90 tablet 3  . tolterodine (DETROL LA) 4 MG 24 hr capsule Take 4 mg by mouth daily.    . traMADol (ULTRAM) 50 MG tablet Take 50 mg by mouth every 6 (six) hours as needed for severe pain.      No current facility-administered medications for this visit.    Allergies:   Mucinex; Ace inhibitors; Codeine; Propoxyphene hcl; Tramadol; Hydromet; and Sulfonamide derivatives    Social History:  The patient  reports that she has never smoked. She does not have any smokeless tobacco history on file. She reports that she does not drink alcohol or use illicit drugs.   Family History:  The patient's family history includes Breast cancer in her mother; Cancer in her mother; Cervical cancer in her mother; Diabetes in her father and maternal grandmother; Uterine cancer in her mother.    ROS:  Please see the history of present illness. All other systems are  reviewed and negative.    PHYSICAL EXAM: VS:  BP 136/88 mmHg  Pulse 68  Ht 5\' 2"  (1.575 m)  Wt 185 lb 8 oz (84.142 kg)  BMI 33.92 kg/m2 , BMI Body mass index is 33.92 kg/(m^2). GEN: Well nourished, well developed, female in no acute distress HEENT: normal for age  Neck: no JVD, left carotid bruit versus radiation of murmur, no masses Cardiac: RRR;  2/6 murmur, no rubs, or gallops Respiratory:  clear to auscultation bilaterally, normal work of breathing GI: soft, nontender, nondistended, + BS MS: no deformity or atrophy; no edema; distal pulses are 2+ in all 4 extremities  Skin: warm and dry, no rash Neuro:  Strength and sensation are intact Psych: euthymic mood, full affect   EKG:  EKG is not ordered today.  Recent Labs: 07/20/2014: TSH 1.523 03/26/2015: BUN 27*; Creatinine, Ser 0.80; Hemoglobin 13.3; Platelets 193; Potassium 3.7; Sodium 141    Lipid Panel    Component Value Date/Time   CHOL 223* 07/02/2013 1301   TRIG 214* 07/02/2013 1301   HDL 53 07/02/2013 1301   CHOLHDL 4.2 07/02/2013 1301   VLDL 43* 07/02/2013 1301   LDLCALC 127* 07/02/2013 1301     Wt Readings from Last 3 Encounters:  03/28/15 185 lb 8 oz (84.142 kg)  03/01/15 188 lb 3.2 oz (85.367 kg)  08/18/14 233 lb 12.8 oz (106.051 kg)     Other studies Reviewed: Additional studies/ records that were reviewed today include:  Previous office notes, recent hospital records in testing.  ASSESSMENT AND PLAN:  1.   Paroxysmal atrial fibrillation: she is very sensitive to her symptoms. She feels that she is aware when she has an episode although most of the episodes are very brief. The emergency room physician suggested that she get on a medication to prevent the episodes versus taking something whenever she gets one and she is curious about this.  She also wants to know when she should go to the hospital as she feels like she converted spontaneously and so could have stayed home.   She would be a candidate for  flecainide, pill-in-pocket approach. She may not need daily flecainide as her episodes are very infrequent an generally very brief.  I will discuss this with Dr. Debara Pickett.   2. Systolic murmur: because she may be placed on flecainide and because of her murmur, we will check an echocardiogram to make sure her EF is normal.   3. Dyspnea on exertion: There is likely an element of deconditioning because of her knee problems. She also feels the pain is causing her to be more short of breath which is likely. However, since she is scheduled for surgery, we will go ahead and do a Lexi scan Myoview.  4. Hypoxia: f/u on results of the sleep study, she may not need overnight O2 any more.  5. Bradycardia: If HR is sustained < 40, may need to change the Cardizem to short acting 30 mg TID or change to a BB.  Current medicines are reviewed at length with the patient today.  The patient does not have concerns regarding medicines.  The following changes have been made:  no change  Labs/ tests ordered today include:   Orders Placed This Encounter  Procedures  . Myocardial Perfusion Imaging  . ECHOCARDIOGRAM COMPLETE   Disposition:   FU with  Dr. Debara Pickett  Signed, Lenoard Aden  03/28/2015 3:00 PM    [address], [place], [place]  [address] Phone: [phone]; Fax: [phone]

## 2015-03-28 NOTE — Telephone Encounter (Signed)
Pt went to the ER on Friday night late,she was in Atrial Fib. They told her to call the office today and talk to somebody.

## 2015-03-28 NOTE — Patient Instructions (Signed)
Your physician has requested that you have an echocardiogram. Echocardiography is a painless test that uses sound waves to create images of your heart. It provides your doctor with information about the size and shape of your heart and how well your heart's chambers and valves are working. This procedure takes approximately one hour. There are no restrictions for this procedure.  Your physician has requested that you have a lexiscan myoview. For further information please visit [email or web address]. Please follow instruction sheet, as given.  Your physician recommends that you schedule a follow-up appointment in: LATE February WITH DR. HILTY

## 2015-04-01 ENCOUNTER — Telehealth (HOSPITAL_COMMUNITY): Payer: Self-pay

## 2015-04-01 ENCOUNTER — Telehealth: Payer: Self-pay | Admitting: Internal Medicine

## 2015-04-01 NOTE — Telephone Encounter (Signed)
Pt called in stating that she received a call from our office(bu tno one answered) and she thought that it might have been in regards to her Oxygen Titration test. Please call her back if this is so. If not please close this encounter.  Thank you

## 2015-04-01 NOTE — Telephone Encounter (Signed)
Encounter complete. 

## 2015-04-05 ENCOUNTER — Telehealth: Payer: Self-pay | Admitting: Internal Medicine

## 2015-04-05 NOTE — Telephone Encounter (Signed)
Please call,questions about her atrial fib.

## 2015-04-05 NOTE — Telephone Encounter (Signed)
While she responded to flecainide in the hospital, we now know she has moderate LVH and signs of structural heart disease on echo, which is a contraindication to flecainide. Given this an her low heart rates, I would prefer a referral to Dr. Rayann Heman or [facility] for their opinion as to the best antiarrythmic strategy.  Dr. Lemmie Evens

## 2015-04-05 NOTE — Telephone Encounter (Signed)
Returned Rebekah [name]'s call. We spoke about her recent visit w/ [name] on the 12th.  Pt notes [name] was going to get Dr. Lysbeth Penner recommendation on whether or not to start flecainide.  Pt states she continues to have resting bradycardic rate and intermittent A Fib episodes w/ rapid rate.  She woke at 3am last night w/ one of these episodes. Notes she went back to sleep and woke around 6am and was still having palps - this resolved when she got in the shower soon afterwards. She can tell when she converts. She did note 2 more isolated episodes today, 2-3 mins long. She has had intermittent isolated episodes throughout the week.  Since her HR normally in 40s & unsure of last BP, advised no change to current diltiazem & metoprolol doses. Pt aware I will forward to Dr. Debara Pickett for recommendations.  Advised to call if new problems.

## 2015-04-06 ENCOUNTER — Ambulatory Visit (HOSPITAL_COMMUNITY)
Admission: RE | Admit: 2015-04-06 | Discharge: 2015-04-06 | Disposition: A | Discharge status: Home / Self Care | Payer: BC Managed Care – PPO | Source: Ambulatory Visit | Attending: Cardiovascular Disease | Admitting: Cardiovascular Disease

## 2015-04-06 DIAGNOSIS — Z9884 Bariatric surgery status: Secondary | ICD-10-CM | POA: Diagnosis not present

## 2015-04-06 DIAGNOSIS — Z8249 Family history of ischemic heart disease and other diseases of the circulatory system: Secondary | ICD-10-CM | POA: Diagnosis not present

## 2015-04-06 DIAGNOSIS — R0609 Other forms of dyspnea: Secondary | ICD-10-CM | POA: Diagnosis not present

## 2015-04-06 DIAGNOSIS — E669 Obesity, unspecified: Secondary | ICD-10-CM | POA: Diagnosis not present

## 2015-04-06 DIAGNOSIS — I10 Essential (primary) hypertension: Secondary | ICD-10-CM | POA: Insufficient documentation

## 2015-04-06 DIAGNOSIS — Z6833 Body mass index (BMI) 33.0-33.9, adult: Secondary | ICD-10-CM | POA: Insufficient documentation

## 2015-04-06 DIAGNOSIS — R001 Bradycardia, unspecified: Secondary | ICD-10-CM | POA: Diagnosis not present

## 2015-04-06 DIAGNOSIS — R079 Chest pain, unspecified: Secondary | ICD-10-CM | POA: Insufficient documentation

## 2015-04-06 LAB — MYOCARDIAL PERFUSION IMAGING
CSEPPHR: 82 {beats}/min
LV dias vol: 150 mL
LV sys vol: 60 mL
Rest HR: 60 {beats}/min
SDS: 0
SRS: 0
SSS: 0
TID: 1.16

## 2015-04-06 MED ORDER — REGADENOSON 0.4 MG/5ML IV SOLN
0.4000 mg | Freq: Once | INTRAVENOUS | Status: AC
  Administered 2015-04-06: 0.4 mg via INTRAVENOUS

## 2015-04-06 MED ORDER — TECHNETIUM TC 99M SESTAMIBI GENERIC - CARDIOLITE
10.8000 | Freq: Once | INTRAVENOUS | Status: AC | PRN
  Administered 2015-04-06: 11 via INTRAVENOUS

## 2015-04-06 MED ORDER — TECHNETIUM TC 99M SESTAMIBI GENERIC - CARDIOLITE
31.2000 | Freq: Once | INTRAVENOUS | Status: AC | PRN
  Administered 2015-04-06: 31.2 via INTRAVENOUS

## 2015-04-06 NOTE — Telephone Encounter (Signed)
Called pt. She understands rationale for referral. She also states she is a little confused regarding the need for her upcoming echo - she had most recent one done in 2015 and we have these records, but she was told at last visit she needed one bc she hadn't had in 4 years.  She came to office today for stress test for pre-op clearance. Understands these results pending.  Will route to Dr. Debara Pickett for recommendations.

## 2015-04-12 ENCOUNTER — Other Ambulatory Visit (HOSPITAL_COMMUNITY): Payer: BC Managed Care – PPO

## 2015-04-13 ENCOUNTER — Encounter: Payer: Self-pay | Admitting: Physician Assistant

## 2015-04-13 NOTE — Telephone Encounter (Signed)
The echo apparently is to evaluate her shortness of breath and a "new murmur" that [name] heard in the office. Also to look for interval changes compared to the prior echo in 2015. Not sure why she was told the last study was 4 years ago (it was in 2015). There may be some confusion there. Either way, the echo and stress test seem like reasonable tests to do based on her recent office visit. Please communicate this to her.  Dr. Lemmie Evens

## 2015-04-19 ENCOUNTER — Telehealth: Payer: Self-pay | Admitting: Internal Medicine

## 2015-04-19 NOTE — Telephone Encounter (Signed)
Spoke with pt, according to dr hilty's note from 04-13-15, the echo is needed.

## 2015-04-19 NOTE — Telephone Encounter (Signed)
Please call asap,she wants to know if she should have her echo tomorrow?

## 2015-04-20 ENCOUNTER — Other Ambulatory Visit: Payer: Self-pay

## 2015-04-20 ENCOUNTER — Ambulatory Visit (HOSPITAL_COMMUNITY): Payer: BC Managed Care – PPO | Attending: Cardiology

## 2015-04-20 DIAGNOSIS — I10 Essential (primary) hypertension: Secondary | ICD-10-CM | POA: Insufficient documentation

## 2015-04-20 DIAGNOSIS — R011 Cardiac murmur, unspecified: Secondary | ICD-10-CM

## 2015-04-20 DIAGNOSIS — E785 Hyperlipidemia, unspecified: Secondary | ICD-10-CM | POA: Insufficient documentation

## 2015-04-20 DIAGNOSIS — R7303 Prediabetes: Secondary | ICD-10-CM | POA: Insufficient documentation

## 2015-04-20 DIAGNOSIS — I340 Nonrheumatic mitral (valve) insufficiency: Secondary | ICD-10-CM | POA: Diagnosis not present

## 2015-04-20 DIAGNOSIS — Z6833 Body mass index (BMI) 33.0-33.9, adult: Secondary | ICD-10-CM | POA: Insufficient documentation

## 2015-04-20 DIAGNOSIS — R079 Chest pain, unspecified: Secondary | ICD-10-CM | POA: Diagnosis present

## 2015-04-20 DIAGNOSIS — I351 Nonrheumatic aortic (valve) insufficiency: Secondary | ICD-10-CM | POA: Insufficient documentation

## 2015-04-20 DIAGNOSIS — I517 Cardiomegaly: Secondary | ICD-10-CM | POA: Insufficient documentation

## 2015-04-26 ENCOUNTER — Telehealth: Payer: Self-pay | Admitting: *Deleted

## 2015-04-26 NOTE — Telephone Encounter (Signed)
Signed surgical clearance faxed to Dr. Alvan Dame for right knee surgery.  Authorized to [employer] 3 days prior to surgery.

## 2015-04-28 ENCOUNTER — Telehealth: Payer: Self-pay | Admitting: Internal Medicine

## 2015-04-28 NOTE — Telephone Encounter (Signed)
Left voice mail telling her that the form has been completed by Dr. Debara Pickett and sent over to her physician's office

## 2015-04-28 NOTE — Telephone Encounter (Signed)
Pt is calling in to see if she is cleared to have her knee surgery that is scheduled for 1/30. She says that Dr. Aurea Graff office faxed over a clearance form on 12/5 but she has not received a response yet.  Please f/u with her  Thanks

## 2015-05-04 ENCOUNTER — Encounter: Payer: Self-pay | Admitting: Internal Medicine

## 2015-05-04 ENCOUNTER — Ambulatory Visit (INDEPENDENT_AMBULATORY_CARE_PROVIDER_SITE_OTHER): Payer: BC Managed Care – PPO | Admitting: Internal Medicine

## 2015-05-04 VITALS — BP 128/80 | HR 55 | Ht 62.0 in | Wt 181.6 lb

## 2015-05-04 DIAGNOSIS — I10 Essential (primary) hypertension: Secondary | ICD-10-CM | POA: Diagnosis not present

## 2015-05-04 DIAGNOSIS — I480 Paroxysmal atrial fibrillation: Secondary | ICD-10-CM

## 2015-05-04 MED ORDER — DILTIAZEM HCL 30 MG PO TABS: ORAL_TABLET | ORAL | Status: DC

## 2015-05-04 MED ORDER — FLECAINIDE ACETATE 100 MG PO TABS: ORAL_TABLET | ORAL | Status: DC

## 2015-05-04 NOTE — Patient Instructions (Addendum)
Medication Instructions:  Your physician has recommended you make the following change in your medication:  1) Stop Metoprolol 2) Take Flecainide 100mg --take 3 tablets by mouth as needed at onset of fast heart rates 3) Take Cardizem 30mg  1-2 by mouth every 6 hours as needed for fast heart rates   Labwork: None ordered   Testing/Procedures: None ordered   Follow-Up: Your physician recommends that you schedule a follow-up appointment in: 6 months with Roderic Palau, NP in the afib clinic   Any Other Special Instructions Will Be Listed Below (If Applicable).     If you need a refill on your cardiac medications before your next appointment, please call your pharmacy.

## 2015-05-04 NOTE — Progress Notes (Signed)
Electrophysiology Office Note   Date:  05/04/2015   ID:  Rebekah [name], DOB 1955-10-04, MRN [id number]  PCP:  Terald Sleeper, PA-C  Cardiologist:  Dr Debara Pickett (referring) Primary Electrophysiologist: Thompson Grayer, MD    Chief Complaint  Patient presents with  . Atrial Fibrillation     History of Present Illness: Rebekah [name] is a 60 y.o. female who presents today for electrophysiology evaluation.   The patient reports initially having atrial fibrillation following bariatric surgery 9/15.  She did well until 4/16 when she woke from sleep with recurrent symptomatic atrial fibrillation.  Her AF persisted for more than 30 hours and so she went to [facility] ER.  She was given flecainide and converted to sinus rhythm.  03/28/15, she had recurrent afib for which she presented to the ED.  She was placed on diltiazem and converted to sinus rhythm.  She reports having intermittent palpitations for about 2 weeks following these events.  She has done well since that time.  She is unaware of triggers or precipitants.    Today, she denies symptoms of chest pain, shortness of breath, orthopnea, PND, lower extremity edema, claudication, dizziness, presyncope, syncope, bleeding, or neurologic sequela. The patient is tolerating medications without difficulties and is otherwise without complaint today.    Past Medical History  Diagnosis Date  . Arthritis   . Hypertension   . GERD (gastroesophageal reflux disease)   . Obesity     s/p gastric sleeve 12/2013 (previously weighed close to 400 lbs)  . Complication of anesthesia   . PONV (postoperative nausea and vomiting)   . Shortness of breath     WITH EXERTION - uses 02 3L nightly / 5 L  other times with exertion ,  resolved with bariatric surgery and weight loss  . Environmental allergies   . Incontinence of urine   . Difficulty sleeping     prior sleep study did not reveal sleep apnea per patient  . Paroxysmal atrial fibrillation (HCC)   . Left atrial  dilatation   . DJD (degenerative joint disease)    Past Surgical History  Procedure Laterality Date  . Tonsillectomy    . Cholecystectomy    . Appendectomy    . Tubal ligation    . Transthoracic echocardiogram  11/2012    EF 60-65%, mod LVH & mod conc hypertrophy, grade 1 diastolic dysfunction; LA mildly dilated; RV systolic pressure increased; PA peak pressure 27mmHg  . Joint replacement  1999    L TOTAL KNEE  . Laparoscopic gastric sleeve resection N/A 01/04/2014    Procedure: LAPAROSCOPIC GASTRIC SLEEVE RESECTION;  Surgeon: Greer Pickerel, MD;  Location: [facility];  Service: General;  Laterality: N/A;  . Upper gi endoscopy  01/04/2014    Procedure: UPPER GI ENDOSCOPY;  Surgeon: Greer Pickerel, MD;  Location: [facility];  Service: General;;     Current Outpatient Prescriptions  Medication Sig Dispense Refill  . acetaminophen (TYLENOL) 500 MG tablet Take 500-1,000 mg by mouth every 6 (six) hours as needed for moderate pain or headache.    Marland Kitchen BIOTIN PO Take 5,000 mcg by mouth daily.     Marland Kitchen CALCIUM PO Take 1,000 mg by mouth daily.    . cetirizine (ZYRTEC) 10 MG tablet Take 10 mg by mouth at bedtime.     . Cyanocobalamin (VITAMIN B 12 PO) Take 500 mcg by mouth daily.    Marland Kitchen diltiazem (DILACOR XR) 120 MG 24 hr capsule Take 1 capsule (120 mg total) by  mouth daily. 90 capsule 3  . docusate sodium (COLACE) 100 MG capsule Take 100 mg by mouth daily.     Marland Kitchen EPINEPHrine (EPIPEN) 0.3 mg/0.3 mL DEVI Inject 0.3 mg into the muscle once as needed (anaphylaxis).     Marland Kitchen escitalopram (LEXAPRO) 20 MG tablet Take 10 mg by mouth every morning.     Marland Kitchen levothyroxine (SYNTHROID, LEVOTHROID) 50 MCG tablet Take 50 mcg by mouth daily before breakfast.    . metoprolol succinate (TOPROL-XL) 25 MG 24 hr tablet Take 0.5 tablets (12.5 mg total) by mouth daily. 15 tablet 5  . Multiple Vitamins-Minerals (MULTIVITAMIN ADULT PO) Take 1 capsule by mouth 2 (two) times daily. Journey 3+3 Bariatric Multivitamin    . OXYGEN Inhale 3 L/min into  the lungs at bedtime. Continuously    . rivaroxaban (XARELTO) 20 MG TABS tablet Take 1 tablet (20 mg total) by mouth daily with supper. 90 tablet 3  . traMADol (ULTRAM) 50 MG tablet Take 50 mg by mouth every 6 (six) hours as needed for severe pain.      No current facility-administered medications for this visit.    Allergies:   Mucinex; Ace inhibitors; Codeine; Propoxyphene hcl; Tramadol; Hydromet; and Sulfonamide derivatives   Social History:  The patient  reports that she has never smoked. She does not have any smokeless tobacco history on file. She reports that she does not drink alcohol or use illicit drugs.   Family History:  The patient's  family history includes Breast cancer in her mother; Cancer in her mother; Cervical cancer in her mother; Diabetes in her father and maternal grandmother; Uterine cancer in her mother.    ROS:  Please see the history of present illness.   All other systems are reviewed and negative.    PHYSICAL EXAM: VS:  BP 128/80 mmHg  Pulse 55  Ht 5\' 2"  (1.575 m)  Wt 181 lb 9.6 oz (82.373 kg)  BMI 33.21 kg/m2 , BMI Body mass index is 33.21 kg/(m^2). GEN: overweight, in no acute distress HEENT: normal Neck: no JVD, carotid bruits, or masses Cardiac: RRR; no murmurs, rubs, or gallops,no edema  Respiratory:  clear to auscultation bilaterally, normal work of breathing GI: soft, nontender, nondistended, + BS MS: no deformity or atrophy Skin: warm and dry  Neuro:  Strength and sensation are intact Psych: euthymic mood, full affect  EKG:  EKG is ordered today. The ekg ordered today shows sinus bradycardia, nonspecific St/T changes    Recent Labs: 07/20/2014: TSH 1.523 03/26/2015: BUN 27*; Creatinine, Ser 0.80; Hemoglobin 13.3; Platelets 193; Potassium 3.7; Sodium 141    Lipid Panel     Component Value Date/Time   CHOL 223* 07/02/2013 1301   TRIG 214* 07/02/2013 1301   HDL 53 07/02/2013 1301   CHOLHDL 4.2 07/02/2013 1301   VLDL 43* 07/02/2013 1301     LDLCALC 127* 07/02/2013 1301     Wt Readings from Last 3 Encounters:  05/04/15 181 lb 9.6 oz (82.373 kg)  04/06/15 185 lb (83.915 kg)  03/28/15 185 lb 8 oz (84.142 kg)      Other studies Reviewed: Additional studies/ records that were reviewed today include: echo, myoview, Dr Hilty's notes, R Barrett's notes    ASSESSMENT AND PLAN:  1.  Paroxysmal atrial fibrillation The patient has symptomatic recurrent atrial fibrillation.  She has done well with pill in pocket flecainide previously but has recently been taken off of this medicine.  Based on most recent echo and myoview, I do not see  any contraindications to this approach.  Therapeutic strategies for afib including medicine and ablation were discussed in detail with the patient today. Risk, benefits, and alternatives to EP study and radiofrequency ablation for afib were also discussed in detail today.  She is most interested in resuming flecainide as a pill in pocket option.  She does not wish to consider ablation at this time.  I will stop metoprolol at this time due to fatigue/ sinus bradycardia and she can use prn cardizem in addition to flecainide.  chads2vasc score is at least 2.  Continue long term anticoagulation  2. preop Proceed with surgery if medically indicated without further EP workup  3. HTN Stable No change required today  Follow-up with Dr Debara Pickett in 3 months (as scheduled) and with Roderic Palau NP in the AF clinic in 6 months.  I will see as needed going forward.  Current medicines are reviewed at length with the patient today.   The patient does not have concerns regarding her medicines.  The following changes were made today:  none   Signed, Thompson Grayer, MD  05/04/2015 9:28 AM     [address] [place] [address] [phone] (office) [phone] (fax)

## 2015-05-06 NOTE — H&P (Signed)
TOTAL KNEE ADMISSION H&P  Patient is being admitted for right total knee arthroplasty.  Subjective:  Chief Complaint:  Right knee primary OA / pain  HPI: Rebekah [name], 60 y.o. female, has a history of pain and functional disability in the right knee due to arthritis and has failed non-surgical conservative treatments for greater than 12 weeks to include NSAID's and/or analgesics, corticosteriod injections, viscosupplementation injections, use of assistive devices and activity modification.  Onset of symptoms was gradual, starting >10 years ago with gradually worsening course since that time. The patient noted no past surgery on the right knee(s).  Patient currently rates pain in the right knee(s) at 8 out of 10 with activity. Patient has night pain, worsening of pain with activity and weight bearing, pain that interferes with activities of daily living, pain with passive range of motion, crepitus and joint swelling.  Patient has evidence of periarticular osteophytes and joint space narrowing by imaging studies.  There is no active infection.   Risks, benefits and expectations were discussed with the patient.  Risks including but not limited to the risk of anesthesia, blood clots, nerve damage, blood vessel damage, failure of the prosthesis, infection and up to and including death.  Patient understand the risks, benefits and expectations and wishes to proceed with surgery.   PCP: Rebekah Sleeper, PA-C  D/C Plans:      Home with HHPT  Post-op Meds:       No Rx given  Tranexamic Acid:      To be given - IV   Decadron:      Is to be given  FYI:     Xarelto (on pre-op)  Dilaudid PO  O2 at night 3L    Patient Active Problem List   Diagnosis Date Noted  . Paroxysmal a-fib ([place]) 07/20/2014  . Prediabetes 01/06/2014  . Dyslipidemia 01/06/2014  . OA (osteoarthritis) 01/06/2014  . S/P laparoscopic sleeve gastrectomy 01/04/14 01/06/2014  . Postoperative atrial fibrillation - resolved 01/06/2014  .  Morbid obesity ([place]) 01/04/2014  . Morbid obesity with BMI of 60.0-69.9, adult ([place]) 10/28/2013  . Dyspnea on exertion 09/05/2012  . Essential hypertension 04/21/2007  . G E R D 04/21/2007   Past Medical History  Diagnosis Date  . Arthritis   . Hypertension   . GERD (gastroesophageal reflux disease)   . Obesity     s/p gastric sleeve 12/2013 (previously weighed close to 400 lbs)  . Complication of anesthesia   . PONV (postoperative nausea and vomiting)   . Shortness of breath     WITH EXERTION - uses 02 3L nightly / 5 L  other times with exertion ,  resolved with bariatric surgery and weight loss  . Environmental allergies   . Incontinence of urine   . Difficulty sleeping     prior sleep study did not reveal sleep apnea per patient  . Paroxysmal atrial fibrillation ([facility])   . Left atrial dilatation   . DJD (degenerative joint disease)     Past Surgical History  Procedure Laterality Date  . Tonsillectomy    . Cholecystectomy    . Appendectomy    . Tubal ligation    . Transthoracic echocardiogram  11/2012    EF 60-65%, mod LVH & mod conc hypertrophy, grade 1 diastolic dysfunction; LA mildly dilated; RV systolic pressure increased; PA peak pressure 78mmHg  . Joint replacement  1999    L TOTAL KNEE  . Laparoscopic gastric sleeve resection N/A 01/04/2014  Procedure: LAPAROSCOPIC GASTRIC SLEEVE RESECTION;  Surgeon: Greer Pickerel, MD;  Location: [facility];  Service: General;  Laterality: N/A;  . Upper gi endoscopy  01/04/2014    Procedure: UPPER GI ENDOSCOPY;  Surgeon: Greer Pickerel, MD;  Location: [facility];  Service: General;;    No prescriptions prior to admission   Allergies  Allergen Reactions  . Mucinex [Guaifenesin Er] Anaphylaxis  . Ace Inhibitors Swelling and Cough    Pedal Edema  . Codeine Nausea Only and Other (See Comments)    Stomach Ache, face itches  . Propoxyphene Hcl Other (See Comments)    REACTION: hallucinate- [place]  . Tramadol Itching and Other (See Comments)     Severe abdominal pain- can stand when she eats.   Marland Kitchen Hydromet [Hydrocodone-Homatropine] Itching and Other (See Comments)    Severe stomach pain-face itches.   . Sulfonamide Derivatives Rash    Social History  Substance Use Topics  . Smoking status: Never Smoker   . Smokeless tobacco: Not on file  . Alcohol Use: No    Family History  Problem Relation Age of Onset  . Diabetes Father   . Diabetes Maternal Grandmother   . Uterine cancer Mother   . Cervical cancer Mother   . Breast cancer Mother   . Cancer Mother     cervical and brreast cancer     Review of Systems  Constitutional: Negative.   HENT: Positive for hearing loss and tinnitus.   Eyes: Negative.   Respiratory: Positive for shortness of breath (on exertion).   Cardiovascular: Negative.   Genitourinary: Positive for frequency.  Musculoskeletal: Positive for joint pain.  Skin: Negative.   Neurological: Negative.   Endo/Heme/Allergies: Negative.   Psychiatric/Behavioral: Negative.     Objective:  Physical Exam  Constitutional: She is oriented to person, place, and time. She appears well-developed.  HENT:  Head: Normocephalic.  Eyes: Pupils are equal, round, and reactive to light.  Neck: Neck supple. No JVD present. No tracheal deviation present. No thyromegaly present.  Cardiovascular: Normal rate, regular rhythm and intact distal pulses.   Murmur heard. Respiratory: Effort normal and breath sounds normal. No stridor. No respiratory distress. She has no wheezes.  GI: Soft. There is no tenderness. There is no guarding.  Musculoskeletal:       Right knee: She exhibits decreased range of motion, swelling and bony tenderness. She exhibits no ecchymosis, no deformity, no laceration and no erythema. Tenderness found.  Lymphadenopathy:    She has no cervical adenopathy.  Neurological: She is alert and oriented to person, place, and time.  Skin: Skin is warm and dry.  Psychiatric: She has a normal mood and affect.       Labs:  Estimated body mass index is 33.92 kg/(m^2) as calculated from the following:   Height as of 03/01/15: 5\' 2"  (1.575 m).   Weight as of 03/28/15: 84.142 kg (185 lb 8 oz).   Imaging Review Plain radiographs demonstrate severe degenerative joint disease of the right knee(s).  The bone quality appears to be good for age and reported activity level.  Assessment/Plan:  End stage arthritis, right knee   The patient history, physical examination, clinical judgment of the provider and imaging studies are consistent with end stage degenerative joint disease of the right knee(s) and total knee arthroplasty is deemed medically necessary. The treatment options including medical management, injection therapy arthroscopy and arthroplasty were discussed at length. The risks and benefits of total knee arthroplasty were presented and reviewed. The risks due  to aseptic loosening, infection, stiffness, patella tracking problems, thromboembolic complications and other imponderables were discussed. The patient acknowledged the explanation, agreed to proceed with the plan and consent was signed. Patient is being admitted for inpatient treatment for surgery, pain control, PT, OT, prophylactic antibiotics, VTE prophylaxis, progressive ambulation and ADL's and discharge planning. The patient is planning to be discharged home with home health services.     West Pugh Citlaly Camplin   PA-C  05/06/2015, 9:13 AM

## 2015-05-09 NOTE — Patient Instructions (Signed)
Rebekah [name]  05/09/2015   Your procedure is scheduled on: 05/17/2015    Report to [facility] Main  Entrance take [place]  elevators to 3rd floor to  [place] at    0830 AM.  Call this number if you have problems the morning of surgery [phone]   Remember: ONLY 1 PERSON MAY GO WITH YOU TO SHORT STAY TO GET  READY MORNING OF [place].  Do not eat food or drink liquids :After Midnight.     Take these medicines the morning of surgery with A SIP OF WATER: DiltiazemXR, Lexapro, Synthroid, Flecainide if needed, Cardiazem if needed                                 You may not have any metal on your body including hair pins and              piercings  Do not wear jewelry, make-up, lotions, powders or perfumes, deodorant             Do not wear nail polish.  Do not shave  48 hours prior to surgery.                Do not bring valuables to the hospital. [place].  Contacts, dentures or bridgework may not be worn into surgery.  Leave suitcase in the car. After surgery it may be brought to your room.         Special Instructions: coughing and deep breathing exercises, leg exercises               Please read over the following fact sheets you were given: _____________________________________________________________________             [facility] - Preparing for Surgery Before surgery, you can play an important role.  Because skin is not sterile, your skin needs to be as free of germs as possible.  You can reduce the number of germs on your skin by washing with CHG (chlorahexidine gluconate) soap before surgery.  CHG is an antiseptic cleaner which kills germs and bonds with the skin to continue killing germs even after washing. Please DO NOT use if you have an allergy to CHG or antibacterial soaps.  If your skin becomes reddened/irritated stop using the CHG and inform your nurse when you arrive at Short  Stay. Do not shave (including legs and underarms) for at least 48 hours prior to the first CHG shower.  You may shave your face/neck. Please follow these instructions carefully:  1.  Shower with CHG Soap the night before surgery and the  morning of Surgery.  2.  If you choose to wash your hair, wash your hair first as usual with your  normal  shampoo.  3.  After you shampoo, rinse your hair and body thoroughly to remove the  shampoo.                           4.  Use CHG as you would any other liquid soap.  You can apply chg directly  to the skin and wash  Gently with a scrungie or clean washcloth.  5.  Apply the CHG Soap to your body ONLY FROM THE NECK DOWN.   Do not use on face/ open                           Wound or open sores. Avoid contact with eyes, ears mouth and genitals (private parts).                       Wash face,  Genitals (private parts) with your normal soap.             6.  Wash thoroughly, paying special attention to the area where your surgery  will be performed.  7.  Thoroughly rinse your body with warm water from the neck down.  8.  DO NOT shower/wash with your normal soap after using and rinsing off  the CHG Soap.                9.  Pat yourself dry with a clean towel.            10.  Wear clean pajamas.            11.  Place clean sheets on your bed the night of your first shower and do not  sleep with pets. Day of Surgery : Do not apply any lotions/deodorants the morning of surgery.  Please wear clean clothes to the hospital/surgery center.  FAILURE TO FOLLOW THESE INSTRUCTIONS MAY RESULT IN THE CANCELLATION OF YOUR SURGERY PATIENT SIGNATURE_________________________________  NURSE SIGNATURE__________________________________  ________________________________________________________________________  WHAT IS A BLOOD TRANSFUSION? Blood Transfusion Information  A transfusion is the replacement of blood or some of its parts. Blood is made up of  multiple cells which provide different functions.  Red blood cells carry oxygen and are used for blood loss replacement.  White blood cells fight against infection.  Platelets control bleeding.  Plasma helps clot blood.  Other blood products are available for specialized needs, such as hemophilia or other clotting disorders. BEFORE THE TRANSFUSION  Who gives blood for transfusions?   Healthy volunteers who are fully evaluated to make sure their blood is safe. This is blood bank blood. Transfusion therapy is the safest it has ever been in the practice of medicine. Before blood is taken from a donor, a complete history is taken to make sure that person has no history of diseases nor engages in risky social behavior (examples are intravenous drug use or sexual activity with multiple partners). The donor's travel history is screened to minimize risk of transmitting infections, such as malaria. The donated blood is tested for signs of infectious diseases, such as HIV and hepatitis. The blood is then tested to be sure it is compatible with you in order to minimize the chance of a transfusion reaction. If you or a relative donates blood, this is often done in anticipation of surgery and is not appropriate for emergency situations. It takes many days to process the donated blood. RISKS AND COMPLICATIONS Although transfusion therapy is very safe and saves many lives, the main dangers of transfusion include:  1. Getting an infectious disease. 2. Developing a transfusion reaction. This is an allergic reaction to something in the blood you were given. Every precaution is taken to prevent this. The decision to have a blood transfusion has been considered carefully by your caregiver before blood is given. Blood is not given unless the benefits outweigh  the risks. AFTER THE TRANSFUSION  Right after receiving a blood transfusion, you will usually feel much better and more energetic. This is especially true if  your red blood cells have gotten low (anemic). The transfusion raises the level of the red blood cells which carry oxygen, and this usually causes an energy increase.  The nurse administering the transfusion will monitor you carefully for complications. HOME CARE INSTRUCTIONS  No special instructions are needed after a transfusion. You may find your energy is better. Speak with your caregiver about any limitations on activity for underlying diseases you may have. SEEK MEDICAL CARE IF:   Your condition is not improving after your transfusion.  You develop redness or irritation at the intravenous (IV) site. SEEK IMMEDIATE MEDICAL CARE IF:  Any of the following symptoms occur over the next 12 hours:  Shaking chills.  You have a temperature by mouth above 102 F (38.9 C), not controlled by medicine.  Chest, back, or muscle pain.  People around you feel you are not acting correctly or are confused.  Shortness of breath or difficulty breathing.  Dizziness and fainting.  You get a rash or develop hives.  You have a decrease in urine output.  Your urine turns a dark color or changes to pink, red, or brown. Any of the following symptoms occur over the next 10 days:  You have a temperature by mouth above 102 F (38.9 C), not controlled by medicine.  Shortness of breath.  Weakness after normal activity.  The white part of the eye turns yellow (jaundice).  You have a decrease in the amount of urine or are urinating less often.  Your urine turns a dark color or changes to pink, red, or brown. Document Released: 03/30/2000 Document Revised: 06/25/2011 Document Reviewed: 11/17/2007 ExitCare Patient Information 2014 [place].  _______________________________________________________________________  Incentive Spirometer  An incentive spirometer is a tool that can help keep your lungs clear and active. This tool measures how well you are filling your lungs with each breath.  Taking long deep breaths may help reverse or decrease the chance of developing breathing (pulmonary) problems (especially infection) following:  A long period of time when you are unable to move or be active. BEFORE THE PROCEDURE   If the spirometer includes an indicator to show your best effort, your nurse or respiratory therapist will set it to a desired goal.  If possible, sit up straight or lean slightly forward. Try not to slouch.  Hold the incentive spirometer in an upright position. INSTRUCTIONS FOR USE  3. Sit on the edge of your bed if possible, or sit up as far as you can in bed or on a chair. 4. Hold the incentive spirometer in an upright position. 5. Breathe out normally. 6. Place the mouthpiece in your mouth and seal your lips tightly around it. 7. Breathe in slowly and as deeply as possible, raising the piston or the ball toward the top of the column. 8. Hold your breath for 3-5 seconds or for as long as possible. Allow the piston or ball to fall to the bottom of the column. 9. Remove the mouthpiece from your mouth and breathe out normally. 10. Rest for a few seconds and repeat Steps 1 through 7 at least 10 times every 1-2 hours when you are awake. Take your time and take a few normal breaths between deep breaths. 11. The spirometer may include an indicator to show your best effort. Use the indicator as a goal to work  toward during each repetition. 12. After each set of 10 deep breaths, practice coughing to be sure your lungs are clear. If you have an incision (the cut made at the time of surgery), support your incision when coughing by placing a pillow or rolled up towels firmly against it. Once you are able to get out of bed, walk around indoors and cough well. You may stop using the incentive spirometer when instructed by your caregiver.  RISKS AND COMPLICATIONS  Take your time so you do not get dizzy or light-headed.  If you are in pain, you may need to take or ask for  pain medication before doing incentive spirometry. It is harder to take a deep breath if you are having pain. AFTER USE  Rest and breathe slowly and easily.  It can be helpful to keep track of a log of your progress. Your caregiver can provide you with a simple table to help with this. If you are using the spirometer at home, follow these instructions: [place] IF:   You are having difficultly using the spirometer.  You have trouble using the spirometer as often as instructed.  Your pain medication is not giving enough relief while using the spirometer.  You develop fever of 100.5 F (38.1 C) or higher. SEEK IMMEDIATE MEDICAL CARE IF:   You cough up bloody sputum that had not been present before.  You develop fever of 102 F (38.9 C) or greater.  You develop worsening pain at or near the incision site. MAKE SURE YOU:   Understand these instructions.  Will watch your condition.  Will get help right away if you are not doing well or get worse. Document Released: 08/13/2006 Document Revised: 06/25/2011 Document Reviewed: 10/14/2006 [facility] Patient Information 2014 [place], [place].   ________________________________________________________________________

## 2015-05-11 ENCOUNTER — Encounter (HOSPITAL_COMMUNITY)
Admission: RE | Admit: 2015-05-11 | Discharge: 2015-05-11 | Disposition: A | Discharge status: Home / Self Care | Payer: BC Managed Care – PPO | Source: Ambulatory Visit | Attending: Orthopedic Surgery | Admitting: Orthopedic Surgery

## 2015-05-11 ENCOUNTER — Encounter (HOSPITAL_COMMUNITY): Payer: Self-pay

## 2015-05-11 DIAGNOSIS — Z01812 Encounter for preprocedural laboratory examination: Secondary | ICD-10-CM | POA: Diagnosis not present

## 2015-05-11 HISTORY — DX: Pneumonia, unspecified organism: J18.9

## 2015-05-11 HISTORY — DX: Cardiac murmur, unspecified: R01.1

## 2015-05-11 HISTORY — DX: Hypothyroidism, unspecified: E03.9

## 2015-05-11 HISTORY — DX: Respiratory tuberculosis unspecified: A15.9

## 2015-05-11 HISTORY — DX: Dependence on supplemental oxygen: Z99.81

## 2015-05-11 LAB — CBC
HCT: 40.3 % (ref 36.0–46.0)
Hemoglobin: 12.8 g/dL (ref 12.0–15.0)
MCH: 28.4 pg (ref 26.0–34.0)
MCHC: 31.8 g/dL (ref 30.0–36.0)
MCV: 89.4 fL (ref 78.0–100.0)
PLATELETS: 142 10*3/uL — AB (ref 150–400)
RBC: 4.51 MIL/uL (ref 3.87–5.11)
RDW: 13.1 % (ref 11.5–15.5)
WBC: 4.6 10*3/uL (ref 4.0–10.5)

## 2015-05-11 LAB — URINALYSIS, ROUTINE W REFLEX MICROSCOPIC
Bilirubin Urine: NEGATIVE
Glucose, UA: NEGATIVE mg/dL
Hgb urine dipstick: NEGATIVE
Ketones, ur: NEGATIVE mg/dL
NITRITE: NEGATIVE
PH: 6 (ref 5.0–8.0)
Protein, ur: NEGATIVE mg/dL
SPECIFIC GRAVITY, URINE: 1.022 (ref 1.005–1.030)

## 2015-05-11 LAB — BASIC METABOLIC PANEL
Anion gap: 8 (ref 5–15)
BUN: 20 mg/dL (ref 6–20)
CALCIUM: 10.1 mg/dL (ref 8.9–10.3)
CHLORIDE: 104 mmol/L (ref 101–111)
CO2: 30 mmol/L (ref 22–32)
CREATININE: 0.72 mg/dL (ref 0.44–1.00)
GFR calc non Af Amer: >60 mL/min (ref >60)
Glucose, Bld: 92 mg/dL (ref 65–99)
Potassium: 4.1 mmol/L (ref 3.5–5.1)
SODIUM: 142 mmol/L (ref 135–145)

## 2015-05-11 LAB — SURGICAL PCR SCREEN
MRSA, PCR: NEGATIVE
STAPHYLOCOCCUS AUREUS: POSITIVE — AB

## 2015-05-11 LAB — ABO/RH: ABO/RH(D): A POS

## 2015-05-11 LAB — URINE MICROSCOPIC-ADD ON

## 2015-05-11 LAB — PROTIME-INR
INR: 1.29 (ref 0.00–1.49)
PROTHROMBIN TIME: 15.7 s — AB (ref 11.6–15.2)

## 2015-05-11 LAB — APTT: aPTT: 37 seconds (ref 24–37)

## 2015-05-11 NOTE — Progress Notes (Signed)
U/A and Micro results done 05/11/15 faxed via EPIC to Dr Alvan Dame.

## 2015-05-11 NOTE — Progress Notes (Signed)
EKG- 03/28/15- EPIC  Clearance- 03/28/15- Dr Debara Pickett on chart 04/20/15- [place]  04/06/15- Stress Test- EPIC  1V CXR- 07/2014- EPIC  05/04/15- [place]- Cardiology- EPIC

## 2015-05-17 ENCOUNTER — Inpatient Hospital Stay (HOSPITAL_COMMUNITY)
Admission: RE | Admit: 2015-05-17 | Discharge: 2015-05-18 | DRG: 470 | Disposition: A | Discharge status: Home Health | Payer: BC Managed Care – PPO | Source: Ambulatory Visit | Attending: Orthopedic Surgery | Admitting: Orthopedic Surgery

## 2015-05-17 ENCOUNTER — Encounter (HOSPITAL_COMMUNITY): Payer: Self-pay | Admitting: Certified Registered Nurse Anesthetist

## 2015-05-17 ENCOUNTER — Inpatient Hospital Stay (HOSPITAL_COMMUNITY): Payer: BC Managed Care – PPO | Admitting: Anesthesiology

## 2015-05-17 ENCOUNTER — Encounter (HOSPITAL_COMMUNITY)
Admission: RE | Disposition: A | Discharge status: Home Health | Payer: Self-pay | Source: Ambulatory Visit | Attending: Orthopedic Surgery

## 2015-05-17 DIAGNOSIS — Z96651 Presence of right artificial knee joint: Secondary | ICD-10-CM

## 2015-05-17 DIAGNOSIS — H9190 Unspecified hearing loss, unspecified ear: Secondary | ICD-10-CM | POA: Diagnosis present

## 2015-05-17 DIAGNOSIS — Z96659 Presence of unspecified artificial knee joint: Secondary | ICD-10-CM

## 2015-05-17 DIAGNOSIS — M25561 Pain in right knee: Secondary | ICD-10-CM | POA: Diagnosis present

## 2015-05-17 DIAGNOSIS — E669 Obesity, unspecified: Secondary | ICD-10-CM | POA: Diagnosis present

## 2015-05-17 DIAGNOSIS — Z01812 Encounter for preprocedural laboratory examination: Secondary | ICD-10-CM | POA: Diagnosis not present

## 2015-05-17 DIAGNOSIS — M1711 Unilateral primary osteoarthritis, right knee: Secondary | ICD-10-CM | POA: Diagnosis present

## 2015-05-17 DIAGNOSIS — Z96652 Presence of left artificial knee joint: Secondary | ICD-10-CM | POA: Diagnosis present

## 2015-05-17 DIAGNOSIS — Z9884 Bariatric surgery status: Secondary | ICD-10-CM | POA: Diagnosis not present

## 2015-05-17 DIAGNOSIS — M659 Synovitis and tenosynovitis, unspecified: Secondary | ICD-10-CM | POA: Diagnosis present

## 2015-05-17 HISTORY — PX: TOTAL KNEE ARTHROPLASTY: SHX125

## 2015-05-17 LAB — TYPE AND SCREEN
ABO/RH(D): A POS
Antibody Screen: NEGATIVE

## 2015-05-17 SURGERY — ARTHROPLASTY, KNEE, TOTAL
Anesthesia: Monitor Anesthesia Care | Site: Knee | Laterality: Right

## 2015-05-17 MED ORDER — METHOCARBAMOL 1000 MG/10ML IJ SOLN
500.0000 mg | Freq: Four times a day (QID) | INTRAMUSCULAR | Status: DC | PRN
  Administered 2015-05-17: 500 mg via INTRAVENOUS
  Filled 2015-05-17 (×2): qty 5

## 2015-05-17 MED ORDER — LORATADINE 10 MG PO TABS
10.0000 mg | ORAL_TABLET | Freq: Every day | ORAL | Status: DC
  Administered 2015-05-18: 10 mg via ORAL
  Filled 2015-05-17: qty 1

## 2015-05-17 MED ORDER — SODIUM CHLORIDE 0.9 % IR SOLN
Status: DC | PRN
  Administered 2015-05-17: 1000 mL

## 2015-05-17 MED ORDER — MIDAZOLAM HCL 2 MG/2ML IJ SOLN
INTRAMUSCULAR | Status: AC
  Filled 2015-05-17: qty 2

## 2015-05-17 MED ORDER — DIPHENHYDRAMINE HCL 25 MG PO CAPS: 25.0000 mg | ORAL_CAPSULE | Freq: Four times a day (QID) | ORAL | Status: DC | PRN

## 2015-05-17 MED ORDER — DEXAMETHASONE SODIUM PHOSPHATE 10 MG/ML IJ SOLN
10.0000 mg | Freq: Once | INTRAMUSCULAR | Status: AC
  Administered 2015-05-17: 10 mg via INTRAVENOUS

## 2015-05-17 MED ORDER — ONDANSETRON HCL 4 MG PO TABS: 4.0000 mg | ORAL_TABLET | Freq: Four times a day (QID) | ORAL | Status: DC | PRN

## 2015-05-17 MED ORDER — STERILE WATER FOR IRRIGATION IR SOLN
Status: DC | PRN
  Administered 2015-05-17: 2000 mL

## 2015-05-17 MED ORDER — CHLORHEXIDINE GLUCONATE 4 % EX LIQD: 60.0000 mL | Freq: Once | CUTANEOUS | Status: DC

## 2015-05-17 MED ORDER — EPINEPHRINE 0.3 MG/0.3ML IJ SOAJ: 0.3000 mg | Freq: Once | INTRAMUSCULAR | Status: DC | PRN

## 2015-05-17 MED ORDER — ONDANSETRON HCL 4 MG/2ML IJ SOLN: 4.0000 mg | Freq: Four times a day (QID) | INTRAMUSCULAR | Status: DC | PRN

## 2015-05-17 MED ORDER — ALUM & MAG HYDROXIDE-SIMETH 200-200-20 MG/5ML PO SUSP: 30.0000 mL | ORAL | Status: DC | PRN

## 2015-05-17 MED ORDER — ESCITALOPRAM OXALATE 10 MG PO TABS
10.0000 mg | ORAL_TABLET | Freq: Every morning | ORAL | Status: DC
  Administered 2015-05-18: 10 mg via ORAL
  Filled 2015-05-17: qty 1

## 2015-05-17 MED ORDER — FENTANYL CITRATE (PF) 100 MCG/2ML IJ SOLN
INTRAMUSCULAR | Status: DC | PRN
  Administered 2015-05-17 (×2): 50 ug via INTRAVENOUS

## 2015-05-17 MED ORDER — BUPIVACAINE-EPINEPHRINE (PF) 0.25% -1:200000 IJ SOLN
INTRAMUSCULAR | Status: DC | PRN
  Administered 2015-05-17: 30 mL

## 2015-05-17 MED ORDER — CEFAZOLIN SODIUM-DEXTROSE 2-3 GM-% IV SOLR
2.0000 g | Freq: Four times a day (QID) | INTRAVENOUS | Status: AC
  Administered 2015-05-17 (×2): 2 g via INTRAVENOUS
  Filled 2015-05-17 (×2): qty 50

## 2015-05-17 MED ORDER — MIDAZOLAM HCL 5 MG/5ML IJ SOLN
INTRAMUSCULAR | Status: DC | PRN
  Administered 2015-05-17 (×2): 1 mg via INTRAVENOUS

## 2015-05-17 MED ORDER — FENTANYL CITRATE (PF) 100 MCG/2ML IJ SOLN
INTRAMUSCULAR | Status: AC
  Filled 2015-05-17: qty 2

## 2015-05-17 MED ORDER — BISACODYL 10 MG RE SUPP: 10.0000 mg | Freq: Every day | RECTAL | Status: DC | PRN

## 2015-05-17 MED ORDER — METOCLOPRAMIDE HCL 10 MG PO TABS: 5.0000 mg | ORAL_TABLET | Freq: Three times a day (TID) | ORAL | Status: DC | PRN

## 2015-05-17 MED ORDER — SODIUM CHLORIDE 0.9 % IJ SOLN
INTRAMUSCULAR | Status: DC | PRN
  Administered 2015-05-17: 30 mL

## 2015-05-17 MED ORDER — DILTIAZEM HCL ER 120 MG PO CP24
120.0000 mg | ORAL_CAPSULE | Freq: Every day | ORAL | Status: DC
  Administered 2015-05-18: 120 mg via ORAL
  Filled 2015-05-17: qty 1

## 2015-05-17 MED ORDER — CEFAZOLIN SODIUM-DEXTROSE 2-3 GM-% IV SOLR
INTRAVENOUS | Status: AC
  Filled 2015-05-17: qty 50

## 2015-05-17 MED ORDER — BUPIVACAINE-EPINEPHRINE (PF) 0.25% -1:200000 IJ SOLN
INTRAMUSCULAR | Status: AC
  Filled 2015-05-17: qty 30

## 2015-05-17 MED ORDER — BUPIVACAINE HCL (PF) 0.75 % IJ SOLN
INTRAMUSCULAR | Status: DC | PRN
  Administered 2015-05-17: 1.4 mL via INTRATHECAL

## 2015-05-17 MED ORDER — FERROUS SULFATE 325 (65 FE) MG PO TABS
325.0000 mg | ORAL_TABLET | Freq: Three times a day (TID) | ORAL | Status: DC
  Administered 2015-05-18: 325 mg via ORAL
  Filled 2015-05-17 (×4): qty 1

## 2015-05-17 MED ORDER — MENTHOL 3 MG MT LOZG: 1.0000 | LOZENGE | OROMUCOSAL | Status: DC | PRN

## 2015-05-17 MED ORDER — PROPOFOL 10 MG/ML IV BOLUS
INTRAVENOUS | Status: AC
  Filled 2015-05-17: qty 40

## 2015-05-17 MED ORDER — EPHEDRINE SULFATE 50 MG/ML IJ SOLN
INTRAMUSCULAR | Status: DC | PRN
  Administered 2015-05-17 (×2): 10 mg via INTRAVENOUS

## 2015-05-17 MED ORDER — KETOROLAC TROMETHAMINE 30 MG/ML IJ SOLN
INTRAMUSCULAR | Status: DC | PRN
  Administered 2015-05-17: 30 mg

## 2015-05-17 MED ORDER — PROPOFOL 500 MG/50ML IV EMUL
INTRAVENOUS | Status: DC | PRN
  Administered 2015-05-17: 50 ug/kg/min via INTRAVENOUS

## 2015-05-17 MED ORDER — CEFAZOLIN SODIUM-DEXTROSE 2-3 GM-% IV SOLR
2.0000 g | INTRAVENOUS | Status: AC
  Administered 2015-05-17: 2 g via INTRAVENOUS

## 2015-05-17 MED ORDER — HYDROMORPHONE HCL 2 MG PO TABS
2.0000 mg | ORAL_TABLET | ORAL | Status: DC
  Administered 2015-05-17 (×2): 2 mg via ORAL
  Administered 2015-05-18 (×3): 4 mg via ORAL
  Filled 2015-05-17: qty 1
  Filled 2015-05-17 (×6): qty 2

## 2015-05-17 MED ORDER — PHENOL 1.4 % MT LIQD: 1.0000 | OROMUCOSAL | Status: DC | PRN

## 2015-05-17 MED ORDER — RIVAROXABAN 10 MG PO TABS
10.0000 mg | ORAL_TABLET | ORAL | Status: DC
  Administered 2015-05-18: 10 mg via ORAL
  Filled 2015-05-17 (×2): qty 1

## 2015-05-17 MED ORDER — POLYETHYLENE GLYCOL 3350 17 G PO PACK: 17.0000 g | PACK | Freq: Two times a day (BID) | ORAL | Status: DC

## 2015-05-17 MED ORDER — HYDROMORPHONE HCL 1 MG/ML IJ SOLN
0.5000 mg | INTRAMUSCULAR | Status: DC | PRN
  Administered 2015-05-17: 1 mg via INTRAVENOUS
  Administered 2015-05-18 (×2): 0.5 mg via INTRAVENOUS
  Filled 2015-05-17 (×3): qty 1

## 2015-05-17 MED ORDER — TRANEXAMIC ACID 1000 MG/10ML IV SOLN
1000.0000 mg | Freq: Once | INTRAVENOUS | Status: AC
  Administered 2015-05-17: 1000 mg via INTRAVENOUS
  Filled 2015-05-17: qty 10

## 2015-05-17 MED ORDER — DILTIAZEM HCL 30 MG PO TABS
30.0000 mg | ORAL_TABLET | Freq: Four times a day (QID) | ORAL | Status: DC | PRN
  Filled 2015-05-17: qty 2

## 2015-05-17 MED ORDER — HYDROMORPHONE HCL 1 MG/ML IJ SOLN
0.2500 mg | INTRAMUSCULAR | Status: DC | PRN
  Administered 2015-05-17 (×2): 0.5 mg via INTRAVENOUS

## 2015-05-17 MED ORDER — LACTATED RINGERS IV SOLN
INTRAVENOUS | Status: DC
  Administered 2015-05-17: 1000 mL via INTRAVENOUS
  Administered 2015-05-17: 12:00:00 via INTRAVENOUS

## 2015-05-17 MED ORDER — SODIUM CHLORIDE 0.9 % IV SOLN
INTRAVENOUS | Status: DC
  Administered 2015-05-17 – 2015-05-18 (×2): via INTRAVENOUS
  Filled 2015-05-17 (×5): qty 1000

## 2015-05-17 MED ORDER — HYDROMORPHONE HCL 1 MG/ML IJ SOLN
INTRAMUSCULAR | Status: AC
  Filled 2015-05-17: qty 1

## 2015-05-17 MED ORDER — 0.9 % SODIUM CHLORIDE (POUR BTL) OPTIME
TOPICAL | Status: DC | PRN
  Administered 2015-05-17: 1000 mL

## 2015-05-17 MED ORDER — FENTANYL CITRATE (PF) 100 MCG/2ML IJ SOLN
25.0000 ug | INTRAMUSCULAR | Status: DC | PRN
  Administered 2015-05-17 (×2): 50 ug via INTRAVENOUS

## 2015-05-17 MED ORDER — METHOCARBAMOL 500 MG PO TABS
500.0000 mg | ORAL_TABLET | Freq: Four times a day (QID) | ORAL | Status: DC | PRN
  Administered 2015-05-17 – 2015-05-18 (×3): 500 mg via ORAL
  Filled 2015-05-17 (×3): qty 1

## 2015-05-17 MED ORDER — SODIUM CHLORIDE 0.9 % IJ SOLN
INTRAMUSCULAR | Status: AC
  Filled 2015-05-17: qty 50

## 2015-05-17 MED ORDER — DEXAMETHASONE SODIUM PHOSPHATE 10 MG/ML IJ SOLN
10.0000 mg | Freq: Once | INTRAMUSCULAR | Status: AC
  Administered 2015-05-18: 10 mg via INTRAVENOUS
  Filled 2015-05-17: qty 1

## 2015-05-17 MED ORDER — DOCUSATE SODIUM 100 MG PO CAPS
100.0000 mg | ORAL_CAPSULE | Freq: Two times a day (BID) | ORAL | Status: DC
  Administered 2015-05-17 – 2015-05-18 (×2): 100 mg via ORAL

## 2015-05-17 MED ORDER — FLECAINIDE ACETATE 100 MG PO TABS
300.0000 mg | ORAL_TABLET | Freq: Once | ORAL | Status: DC | PRN
  Filled 2015-05-17: qty 3

## 2015-05-17 MED ORDER — MAGNESIUM CITRATE PO SOLN
1.0000 | Freq: Once | ORAL | Status: DC | PRN
Start: 2015-05-17 — End: 2015-05-18

## 2015-05-17 MED ORDER — LEVOTHYROXINE SODIUM 50 MCG PO TABS
50.0000 ug | ORAL_TABLET | Freq: Every day | ORAL | Status: DC
  Administered 2015-05-18: 50 ug via ORAL
  Filled 2015-05-17 (×2): qty 1

## 2015-05-17 MED ORDER — KETOROLAC TROMETHAMINE 30 MG/ML IJ SOLN
INTRAMUSCULAR | Status: AC
  Filled 2015-05-17: qty 1

## 2015-05-17 MED ORDER — METOCLOPRAMIDE HCL 5 MG/ML IJ SOLN: 5.0000 mg | Freq: Three times a day (TID) | INTRAMUSCULAR | Status: DC | PRN

## 2015-05-17 SURGICAL SUPPLY — 44 items
BAG DECANTER FOR FLEXI CONT (MISCELLANEOUS) IMPLANT
BAG ZIPLOCK 12X15 (MISCELLANEOUS) IMPLANT
BANDAGE ACE 6X5 VEL STRL LF (GAUZE/BANDAGES/DRESSINGS) ×2 IMPLANT
BLADE SAW SGTL 13.0X1.19X90.0M (BLADE) ×2 IMPLANT
BOWL SMART MIX CTS (DISPOSABLE) ×2 IMPLANT
CAPT KNEE TOTAL 3 ATTUNE ×2 IMPLANT
CEMENT HV SMART SET (Cement) ×4 IMPLANT
CLOTH BEACON ORANGE TIMEOUT ST (SAFETY) ×2 IMPLANT
CUFF TOURN SGL QUICK 34 (TOURNIQUET CUFF) ×2
CUFF TRNQT CYL 34X4X40X1 (TOURNIQUET CUFF) ×1 IMPLANT
DRAPE U-SHAPE 47X51 STRL (DRAPES) ×2 IMPLANT
DRSG AQUACEL AG ADV 3.5X10 (GAUZE/BANDAGES/DRESSINGS) ×2 IMPLANT
DURAPREP 26ML APPLICATOR (WOUND CARE) ×4 IMPLANT
ELECT REM PT RETURN 9FT ADLT (ELECTROSURGICAL) ×2
ELECTRODE REM PT RTRN 9FT ADLT (ELECTROSURGICAL) ×1 IMPLANT
GLOVE BIOGEL M 7.0 STRL (GLOVE) ×2 IMPLANT
GLOVE BIOGEL PI IND STRL 6.5 (GLOVE) ×1 IMPLANT
GLOVE BIOGEL PI IND STRL 7.5 (GLOVE) ×1 IMPLANT
GLOVE BIOGEL PI INDICATOR 6.5 (GLOVE) ×1
GLOVE BIOGEL PI INDICATOR 7.5 (GLOVE) ×1
GLOVE ORTHO TXT STRL SZ7.5 (GLOVE) ×4 IMPLANT
GLOVE SURG SS PI 7.0 STRL IVOR (GLOVE) ×2 IMPLANT
GOWN STRL REUS W/TWL LRG LVL3 (GOWN DISPOSABLE) ×2 IMPLANT
GOWN STRL REUS W/TWL XL LVL3 (GOWN DISPOSABLE) ×2 IMPLANT
HANDPIECE INTERPULSE COAX TIP (DISPOSABLE) ×2
LIQUID BAND (GAUZE/BANDAGES/DRESSINGS) ×2 IMPLANT
MANIFOLD NEPTUNE II (INSTRUMENTS) ×2 IMPLANT
NDL SAFETY ECLIPSE 18X1.5 (NEEDLE) IMPLANT
NEEDLE HYPO 18GX1.5 SHARP (NEEDLE)
PACK TOTAL KNEE CUSTOM (KITS) ×2 IMPLANT
POSITIONER SURGICAL ARM (MISCELLANEOUS) ×2 IMPLANT
SET HNDPC FAN SPRY TIP SCT (DISPOSABLE) ×1 IMPLANT
SET PAD KNEE POSITIONER (MISCELLANEOUS) ×2 IMPLANT
SUCTION FRAZIER 12FR DISP (SUCTIONS) ×2 IMPLANT
SUT MNCRL AB 4-0 PS2 18 (SUTURE) ×2 IMPLANT
SUT VIC AB 1 CT1 36 (SUTURE) ×2 IMPLANT
SUT VIC AB 2-0 CT1 27 (SUTURE) ×6
SUT VIC AB 2-0 CT1 TAPERPNT 27 (SUTURE) ×3 IMPLANT
SUT VLOC 180 0 24IN GS25 (SUTURE) ×2 IMPLANT
SYR CONTROL 10ML LL (SYRINGE) ×2 IMPLANT
TRAY FOLEY W/METER SILVER 14FR (SET/KITS/TRAYS/PACK) ×2 IMPLANT
WATER STERILE IRR 1500ML POUR (IV SOLUTION) ×2 IMPLANT
WRAP KNEE MAXI GEL POST OP (GAUZE/BANDAGES/DRESSINGS) ×2 IMPLANT
YANKAUER SUCT BULB TIP 10FT TU (MISCELLANEOUS) ×2 IMPLANT

## 2015-05-17 NOTE — Anesthesia Procedure Notes (Signed)
Spinal Patient location during procedure: OR Start time: 05/17/2015 11:35 AM End time: 05/17/2015 11:39 AM Staffing Resident/CRNA: Herbie Drape J Performed by: resident/CRNA  Preanesthetic Checklist Completed: patient identified, site marked, surgical consent, pre-op evaluation, timeout performed, IV checked, risks and benefits discussed and monitors and equipment checked Spinal Block Patient position: sitting Prep: Betadine Patient monitoring: heart rate, continuous pulse ox and blood pressure Approach: midline Location: L4-5 Injection technique: single-shot Needle Needle type: Spinocan  Needle gauge: 22 G Needle length: 9 cm Needle insertion depth: 7 cm

## 2015-05-17 NOTE — Anesthesia Preprocedure Evaluation (Signed)
Anesthesia Evaluation  Patient identified by MRN, date of birth, ID band Patient awake    Reviewed: Allergy & Precautions, NPO status , Patient's Chart, lab work & pertinent test results  History of Anesthesia Complications (+) PONV  Airway Mallampati: II   Neck ROM: full    Dental   Pulmonary shortness of breath,    breath sounds clear to auscultation       Cardiovascular hypertension, + dysrhythmias Atrial Fibrillation  Rhythm:regular Rate:Normal     Neuro/Psych    GI/Hepatic GERD  ,  Endo/Other  Hypothyroidism   Renal/GU      Musculoskeletal  (+) Arthritis ,   Abdominal   Peds  Hematology   Anesthesia Other Findings   Reproductive/Obstetrics                             Anesthesia Physical Anesthesia Plan  ASA: III  Anesthesia Plan: MAC and Spinal   Post-op Pain Management:    Induction: Intravenous  Airway Management Planned: Simple Face Mask  Additional Equipment:   Intra-op Plan:   Post-operative Plan:   Informed Consent: I have reviewed the patients History and Physical, chart, labs and discussed the procedure including the risks, benefits and alternatives for the proposed anesthesia with the patient or authorized representative who has indicated his/her understanding and acceptance.     Plan Discussed with: CRNA, Anesthesiologist and Surgeon  Anesthesia Plan Comments:         Anesthesia Quick Evaluation

## 2015-05-17 NOTE — Op Note (Signed)
NAME:  Rebekah [name]                      MEDICAL RECORD NO.:  [id number]                             FACILITY:  [facility]      PHYSICIAN:  Pietro Cassis. Alvan Dame, M.D.  DATE OF BIRTH:  [birth year]      DATE OF PROCEDURE:  05/17/2015                                     OPERATIVE REPORT         PREOPERATIVE DIAGNOSIS:  Right knee osteoarthritis.      POSTOPERATIVE DIAGNOSIS:  Right knee osteoarthritis.      FINDINGS:  The patient was noted to have complete loss of cartilage and   bone-on-bone arthritis with associated osteophytes in all three compartments of   the knee with a significant synovitis and associated effusion.      PROCEDURE:  Right total knee replacement.      COMPONENTS USED:  DePuy Attune rotating platform posterior stabilized knee   system, a size 6 femur, 6 tibia, 6 mm AOX PS insert, and 35 anatomic patellar   button.      SURGEON:  Pietro Cassis. Alvan Dame, M.D.      ASSISTANT:  Nehemiah Massed, PA-C.      ANESTHESIA:  Spinal.      SPECIMENS:  None.      COMPLICATION:  None.      DRAINS: None  EBL: <100cc      TOURNIQUET TIME:   Total Tourniquet Time Documented: Thigh (Right) - 36 minutes Total: Thigh (Right) - 36 minutes  .      The patient was stable to the recovery room.      INDICATION FOR PROCEDURE:  Rebekah [name] is a 60 y.o. female patient of   mine.  The patient had been seen, evaluated, and treated conservatively in the   office with medication, activity modification, and injections.  The patient had   radiographic changes of bone-on-bone arthritis with endplate sclerosis and osteophytes noted.      The patient failed conservative measures including medication, injections, and activity modification, and at this point was ready for more definitive measures.   Based on the radiographic changes and failed conservative measures, the patient   decided to proceed with total knee replacement.  Risks of infection,   DVT, component failure, need for revision surgery,  postop course, and   expectations were all   discussed and reviewed.  Consent was obtained for benefit of pain   relief.      PROCEDURE IN DETAIL:  The patient was brought to the operative theater.   Once adequate anesthesia, preoperative antibiotics, 2 gm of Ancef, 1 gm of Tranexamic Acid, and 10 mg of Decadron administered, the patient was positioned supine with the right thigh tourniquet placed.  The  right lower extremity was prepped and draped in sterile fashion.  A time-   out was performed identifying the patient, planned procedure, and   extremity.      The right lower extremity was placed in the [facility] leg holder.  The leg was   exsanguinated, tourniquet elevated to 250 mmHg.  A midline incision was   made followed by median parapatellar  arthrotomy.  Following initial   exposure, attention was first directed to the patella.  Precut   measurement was noted to be 23 mm.  I resected down to 14 mm and used a   35 anatomic patellar button to restore patellar height as well as cover the cut   surface.      The lug holes were drilled and a metal shim was placed to protect the   patella from retractors and saw blades.      At this point, attention was now directed to the femur.  The femoral   canal was opened with a drill, irrigated to try to prevent fat emboli.  An   intramedullary rod was passed at 3 degrees valgus, 9 mm of bone was   resected off the distal femur.  Following this resection, the tibia was   subluxated anteriorly.  Using the extramedullary guide, 2 mm of bone was resected off   the proximal medial tibia.  We confirmed the gap would be   stable medially and laterally with a size 5 insert as well as confirmed   the cut was perpendicular in the coronal plane, checking with an alignment rod.      Once this was done, I sized the femur to be a size 6 in the anterior-   posterior dimension, chose a standard component based on medial and   lateral dimension.  The size 6  rotation block was then pinned in   position anterior referenced using the C-clamp to set rotation.  The   anterior, posterior, and  chamfer cuts were made without difficulty nor   notching making certain that I was along the anterior cortex to help   with flexion gap stability.      The final box cut was made off the lateral aspect of distal femur.      At this point, the tibia was sized to be a size 6, the size 6 tray was   then pinned in position through the medial third of the tubercle,   drilled, and keel punched.  Trial reduction was now carried with a 6 femur,  6 tibia, a size 6 mm PS insert, and the 35 anatomic patella botton.  The knee was brought to   extension, full extension with good flexion stability with the patella   tracking through the trochlea without application of pressure.  Given   all these findings the femoral lugs were drilled and the trial components removed.  Final components were   opened and cement was mixed.  The knee was irrigated with normal saline   solution and pulse lavage.  The synovial lining was   then injected with 30 cc of 0.25% Marcaine with epinephrine and 1 cc of Toradol plus 30 cc of NS for a  total of 61 cc.      The knee was irrigated.  Final implants were then cemented onto clean and   dried cut surfaces of bone with the knee brought to extension with a size 6 mm trial insert.      Once the cement had fully cured, the excess cement was removed   throughout the knee.  I confirmed I was satisfied with the range of   motion and stability, and the final size 6 mm PS AOX insert was chosen.  It was   placed into the knee.      The tourniquet had been let down at 36 minutes.  No significant   hemostasis  required.  The   extensor mechanism was then reapproximated using #1 Vicryl and #0 V-lock sutures with the knee   in flexion.  The   remaining wound was closed with 2-0 Vicryl and running 4-0 Monocryl.   The knee was cleaned, dried, dressed  sterilely using Dermabond and   Aquacel dressing.  The patient was then   brought to recovery room in stable condition, tolerating the procedure   well.   Please note that Physician Assistant, Nehemiah Massed, PA-C, was present for the entirety of the case, and was utilized for pre-operative positioning, peri-operative retractor management, general facilitation of the procedure.  He was also utilized for primary wound closure at the end of the case.              Pietro Cassis Alvan Dame, M.D.    05/17/2015 2:38 PM

## 2015-05-17 NOTE — Anesthesia Postprocedure Evaluation (Signed)
Anesthesia Post Note  Patient: Rebekah [name]  Procedure(s) Performed: Procedure(s) (LRB): RIGHT TOTAL KNEE ARTHROPLASTY (Right)  Patient location during evaluation: PACU Anesthesia Type: Spinal Level of consciousness: oriented and awake and alert Pain management: pain level controlled Vital Signs Assessment: post-procedure vital signs reviewed and stable Respiratory status: spontaneous breathing, respiratory function stable and patient connected to nasal cannula oxygen Cardiovascular status: blood pressure returned to baseline and stable Postop Assessment: no headache and no backache Anesthetic complications: no    Last Vitals:  Filed Vitals:   05/17/15 1509 05/17/15 1609  BP: 157/90 154/88  Pulse: 67 73  Temp: 36.6 C 36.7 C  Resp: 16 15    Last Pain:  Filed Vitals:   05/17/15 1620  PainSc: [place]

## 2015-05-17 NOTE — Transfer of Care (Signed)
Immediate Anesthesia Transfer of Care Note  Patient: Rebekah [name]  Procedure(s) Performed: Procedure(s): RIGHT TOTAL KNEE ARTHROPLASTY (Right)  Patient Location: PACU  Anesthesia Type:Spinal  Level of Consciousness: awake, alert  and oriented  Airway & Oxygen Therapy: Patient Spontanous Breathing and Patient connected to face mask oxygen  Post-op Assessment: Report given to RN and Post -op Vital signs reviewed and stable  Post vital signs: Reviewed and stable  Last Vitals:  Filed Vitals:   05/17/15 0832  BP: 157/93  Pulse: 63  Temp: 36.7 C  Resp: 18    Complications: No apparent anesthesia complications

## 2015-05-17 NOTE — Interval H&P Note (Signed)
History and Physical Interval Note:  05/17/2015 10:26 AM  Rebekah [name]  has presented today for surgery, with the diagnosis of right knee osteoarthritis  The various methods of treatment have been discussed with the patient and family. After consideration of risks, benefits and other options for treatment, the patient has consented to  Procedure(s): RIGHT TOTAL KNEE ARTHROPLASTY (Right) as a surgical intervention .  The patient's history has been reviewed, patient examined, no change in status, stable for surgery.  I have reviewed the patient's chart and labs.  Questions were answered to the patient's satisfaction.     Mauri Pole

## 2015-05-17 NOTE — Discharge Instructions (Signed)

## 2015-05-17 NOTE — Progress Notes (Signed)
Utilization review completed.  

## 2015-05-18 LAB — BASIC METABOLIC PANEL
Anion gap: 8 (ref 5–15)
BUN: 21 mg/dL — ABNORMAL HIGH (ref 6–20)
CALCIUM: 9.1 mg/dL (ref 8.9–10.3)
CO2: 27 mmol/L (ref 22–32)
Chloride: 106 mmol/L (ref 101–111)
Creatinine, Ser: 0.73 mg/dL (ref 0.44–1.00)
GFR calc Af Amer: >60 mL/min (ref >60)
Glucose, Bld: 141 mg/dL — ABNORMAL HIGH (ref 65–99)
POTASSIUM: 4.1 mmol/L (ref 3.5–5.1)
SODIUM: 141 mmol/L (ref 135–145)

## 2015-05-18 LAB — CBC
HCT: 29.7 % — ABNORMAL LOW (ref 36.0–46.0)
Hemoglobin: 9.6 g/dL — ABNORMAL LOW (ref 12.0–15.0)
MCH: 29.2 pg (ref 26.0–34.0)
MCHC: 32.3 g/dL (ref 30.0–36.0)
MCV: 90.3 fL (ref 78.0–100.0)
PLATELETS: 117 10*3/uL — AB (ref 150–400)
RBC: 3.29 MIL/uL — AB (ref 3.87–5.11)
RDW: 13.4 % (ref 11.5–15.5)
WBC: 8 10*3/uL (ref 4.0–10.5)

## 2015-05-18 MED ORDER — POLYETHYLENE GLYCOL 3350 17 G PO PACK: 17.0000 g | PACK | Freq: Two times a day (BID) | ORAL | Status: DC

## 2015-05-18 MED ORDER — HYDROMORPHONE HCL 2 MG PO TABS: 2.0000 mg | ORAL_TABLET | ORAL | Status: DC | PRN

## 2015-05-18 MED ORDER — METHOCARBAMOL 500 MG PO TABS: 500.0000 mg | ORAL_TABLET | Freq: Four times a day (QID) | ORAL | Status: DC | PRN

## 2015-05-18 MED ORDER — DOCUSATE SODIUM 100 MG PO CAPS: 100.0000 mg | ORAL_CAPSULE | Freq: Two times a day (BID) | ORAL | Status: DC

## 2015-05-18 MED ORDER — FERROUS SULFATE 325 (65 FE) MG PO TABS: 325.0000 mg | ORAL_TABLET | Freq: Three times a day (TID) | ORAL | Status: DC

## 2015-05-18 MED FILL — HYDROmorphone HCL 2 MG TABS: 2 | 8 days supply | Qty: 100 | Fill #0

## 2015-05-18 MED FILL — METHOCARBAMOL 500 MG TABLET: 500 | 10 days supply | Qty: 40 | Fill #0

## 2015-05-18 NOTE — Progress Notes (Signed)
Physical Therapy Treatment Patient Details Name: Rebekah [name] MRN: [id number] DOB: 1955/04/30 Today's Date: 05-19-15    History of Present Illness Pt s/p R TKR with hx of L TKR 1999    PT Comments    Pt motivated but ltd this pm by increased pain.  Reviewed stair with pt and spouse.  Follow Up Recommendations  Home health PT     Equipment Recommendations  None recommended by PT    Recommendations for Other Services OT consult     Precautions / Restrictions Precautions Precautions: Knee;Fall Restrictions Weight Bearing Restrictions: No Other Position/Activity Restrictions: WBAT    Mobility  Bed Mobility               General bed mobility comments: PT OOB and declines back to bed  Transfers Overall transfer level: Needs assistance Equipment used: Rolling walker (2 wheeled) Transfers: Sit to/from Stand Sit to Stand: Supervision         General transfer comment: cues for LE management and use of UEs to self assist  Ambulation/Gait Ambulation/Gait assistance: Min guard;Supervision Ambulation Distance (Feet): 44 Feet (and 20' into bathroom) Assistive device: Rolling walker (2 wheeled) Gait Pattern/deviations: Step-to pattern;Decreased step length - right;Decreased step length - left;Shuffle;Trunk flexed Gait velocity: decr   General Gait Details: cues for sequence, posture and position from RW   Stairs Stairs: Yes Stairs assistance: Min assist Stair Management: No rails;Step to pattern;Backwards;With walker Number of Stairs: 3 General stair comments: single step 3x with spouse assisting on last attempt  Wheelchair Mobility    Modified Rankin (Stroke Patients Only)       Balance                                    Cognition Arousal/Alertness: Awake/alert Behavior During Therapy: WFL for tasks assessed/performed Overall Cognitive Status: Within Functional Limits for tasks assessed                      Exercises       General Comments        Pertinent Vitals/Pain Pain Assessment: 0-10 Pain Score: 7  Pain Location: R knee Pain Descriptors / Indicators: Aching;Sore Pain Intervention(s): Limited activity within patient's tolerance;Monitored during session;Premedicated before session;Ice applied    Home Living                      Prior Function            PT Goals (current goals can now be found in the care plan section) Acute Rehab PT Goals Patient Stated Goal: Resume previous lifestyle wtih decreased pain PT Goal Formulation: With patient Time For Goal Achievement: 05/21/15 Potential to Achieve Goals: Good Progress towards PT goals: Progressing toward goals    Frequency  7X/week    PT Plan Current plan remains appropriate    Co-evaluation             End of Session Equipment Utilized During Treatment: Gait belt Activity Tolerance: Patient tolerated treatment well Patient left: in chair;with call bell/phone within reach;with family/visitor present     Time: [id number] PT Time Calculation (min) (ACUTE ONLY): 17 min  Charges:  $Gait Training: 8-22 mins                    G Codes:      Rebekah [name] 05-19-15, 5:13 PM

## 2015-05-18 NOTE — Care Management Note (Signed)
Case Management Note  Patient Details  Name: ARPITA FENTRESS MRN: [id number] Date of Birth: [birth year]  Subjective/Objective:                  RIGHT TOTAL KNEE ARTHROPLASTY (Right) Action/Plan: Discharge planning Expected Discharge Date:  05/18/15             Expected Discharge Plan:  [place]  In-House Referral:     Discharge planning Services  CM Consult  Post Acute Care Choice:  Home Health Choice offered to:  Patient  DME Arranged:  N/A DME Agency:  NA  HH Arranged:  PT HH Agency:  [facility]  Status of Service:  Completed, signed off  Medicare Important Message Given:    Date Medicare IM Given:    Medicare IM give by:    Date Additional Medicare IM Given:    Additional Medicare Important Message give by:     If discussed at [place] of Stay Meetings, dates discussed:    Additional Comments: CM met with pt in room to offer choice of home health agency,  Pt chooses [facility] to render HHPT.  Pt states she has rolling walker and bedside commode at home.  Referral given to [employer], Tim.  No other CM needs were communicated. Dellie Catholic, RN 05/18/2015, 12:09 PM

## 2015-05-18 NOTE — Progress Notes (Signed)
Pt to d/c home with [facility] home [facility]. No DME needs. AVS reviewed and "My Chart" discussed with pt. Pt capable of verbalizing medications, signs and symptoms of infection, and follow-up appointments. Remains hemodynamically stable. No signs and symptoms of distress. Educated pt to return to ER in the case of SOB, dizziness, or chest pain.  

## 2015-05-18 NOTE — Progress Notes (Signed)
     Subjective: 1 Day Post-Op Procedure(s) (LRB): RIGHT TOTAL KNEE ARTHROPLASTY (Right)   Patient reports pain as mild, pain controlled. No events throughout the night. Ready to work with PT and get this knee well. Discussed the use of ACE bandages in the place of TED hose.  Ready to be discharged home.  Objective:   VITALS:   Filed Vitals:   05/18/15 0239 05/18/15 0644  BP: 108/71 149/79  Pulse: 63 60  Temp: 98.6 F (37 C) 98.7 F (37.1 C)  Resp: 16 16    Dorsiflexion/Plantar flexion intact Incision: dressing C/D/I No cellulitis present Compartment soft  LABS  Recent Labs  05/18/15 0456  HGB 9.6*  HCT 29.7*  WBC 8.0  PLT 117*     Recent Labs  05/18/15 0456  NA 141  K 4.1  BUN 21*  CREATININE 0.73  GLUCOSE 141*     Assessment/Plan: 1 Day Post-Op Procedure(s) (LRB): RIGHT TOTAL KNEE ARTHROPLASTY (Right) Foley cath d/c'ed Advance diet Up with therapy D/C IV fluids Discharge home with home health  Follow up in 2 weeks at [facility]. Follow up with OLIN,Clay Solum D in 2 weeks.  Contact information:  [facility] [address], [place] [id number]    Obese (BMI 30-39.9) Estimated body mass index is 32.91 kg/(m^2) as calculated from the following:   Height as of this encounter: 5\' 2"  (1.575 m).   Weight as of this encounter: 81.647 kg (180 lb). Patient also counseled that weight may inhibit the healing process Patient counseled that losing weight will help with future health issues         [place] Pugh. Aster Screws   PAC  05/18/2015, 9:45 AM

## 2015-05-18 NOTE — Progress Notes (Signed)
Occupational Therapy Evaluation Patient Details Name: Rebekah [name] MRN: [id number] DOB: May 30, 1955 Today's Date: 05/18/2015    History of Present Illness Pt s/p R TKR with hx of L TKR 1999   Clinical Impression   Patient educated on all ADL techniques and ADL transfer techniques. All question answered. No further OT needs. Will sign off.    Follow Up Recommendations  No OT follow up;Supervision - Intermittent    Equipment Recommendations  3 in 1 bedside comode    Recommendations for Other Services       Precautions / Restrictions Precautions Precautions: Knee;Fall Restrictions Weight Bearing Restrictions: No Other Position/Activity Restrictions: WBAT      Mobility Bed Mobility              Transfers Overall transfer level: Needs assistance Equipment used: Rolling walker (2 wheeled) Transfers: Sit to/from Stand Sit to Stand: Supervision             Balance                                            ADL Overall ADL's : Needs assistance/impaired Eating/Feeding: Independent;Sitting   Grooming: Wash/dry hands;Wash/dry face;Oral care;Applying deodorant;Brushing hair;Supervision/safety;Standing   Upper Body Bathing: Supervision/ safety;Standing   Lower Body Bathing: Minimal assistance;Sit to/from stand   Upper Body Dressing : Set up;Sitting   Lower Body Dressing: Minimal assistance;Sit to/from stand   Toilet Transfer: Supervision/safety;Ambulation;RW   Toileting- Clothing Manipulation and Hygiene: Supervision/safety;Sit to/from stand       Functional mobility during ADLs: Supervision/safety;Rolling walker General ADL Comments: Patient received in the bathroom completing toileting. After toileting, she performed grooming at sink, then ambulated to recliner with RW and donned her pajama top and bottom from home. Patient tolerated session well. Education regarding tub/shower transfer with therapist demonstration. Patient and husband  to purchase shower chair and they verbalize understanding of techniques. No further OT needs as all education complete.     Vision     Perception     Praxis      Pertinent Vitals/Pain Pain Assessment: 0-10 Pain Score: 4  Pain Location: R knee Pain Descriptors / Indicators: Aching;Sore Pain Intervention(s): Limited activity within patient's tolerance;Monitored during session;Repositioned;Ice applied     Hand Dominance Right   Extremity/Trunk Assessment Upper Extremity Assessment Upper Extremity Assessment: Overall WFL for tasks assessed   Lower Extremity Assessment Lower Extremity Assessment: Defer to PT evaluation RLE Deficits / Details: 3/5 quads with IND SLR and AAROM -10 - 40   Cervical / Trunk Assessment Cervical / Trunk Assessment: Normal   Communication Communication Communication: No difficulties   Cognition Arousal/Alertness: Awake/alert Behavior During Therapy: WFL for tasks assessed/performed Overall Cognitive Status: Within Functional Limits for tasks assessed                     General Comments       Exercises      Shoulder Instructions      Home Living Family/patient expects to be discharged to:: Private residence Living Arrangements: Spouse/significant other Available Help at Discharge: Family Type of Home: House Home Access: Stairs to enter Technical brewer of Steps: 1   Home Layout: One level     Bathroom Shower/Tub: Teacher, [employer] years/pre: Standard Bathroom Accessibility: Yes How Accessible: Accessible via walker Home Equipment: Walker - standard;Walker - 2 wheels (patient reports she will purchase tub  seat)          Prior Functioning/Environment Level of Independence: Independent             OT Diagnosis: Acute pain   OT Problem List: Decreased strength;Decreased range of motion;Decreased knowledge of use of DME or AE;Pain   OT Treatment/Interventions:      OT Goals(Current goals can be found  in the care plan section) Acute Rehab OT Goals Patient Stated Goal: Resume previous lifestyle wtih decreased pain OT Goal Formulation: All assessment and education complete, DC therapy  OT Frequency:     Barriers to D/C:            Co-evaluation              End of Session Equipment Utilized During Treatment: Rolling walker Nurse Communication: Mobility status  Activity Tolerance: Patient tolerated treatment well Patient left: in chair;with call bell/phone within reach;with family/visitor present   Time: 1015-1051 OT Time Calculation (min): 36 min Charges:  OT General Charges $OT Visit: 1 Procedure OT Evaluation $OT Eval Moderate Complexity: 1 Procedure OT Treatments $Self Care/Home Management : 8-22 mins G-Codes:    Ringo Sherod A Jun 09, 2015, 12:47 PM

## 2015-05-18 NOTE — Evaluation (Signed)
Physical Therapy Evaluation Patient Details Name: Rebekah [name] MRN: [id number] DOB: 02-09-1956 Today's Date: 05/18/2015   History of Present Illness  Pt s/p R TKR with hx of L TKR 1999  Clinical Impression  Pt s/p R TKR presents with decreased R LE strength/ROM and post op pain limiting functional mobility.  Pt should progress to dc home with family assist and HHPT follow up.    Follow Up Recommendations Home health PT    Equipment Recommendations  None recommended by PT    Recommendations for Other Services OT consult     Precautions / Restrictions Precautions Precautions: Knee;Fall Restrictions Weight Bearing Restrictions: No Other Position/Activity Restrictions: WBAT      Mobility  Bed Mobility Overal bed mobility: Needs Assistance Bed Mobility: Supine to Sit     Supine to sit: Min guard     General bed mobility comments: Cues for sequence and use of LLE to self assist  Transfers Overall transfer level: Needs assistance Equipment used: Rolling walker (2 wheeled) Transfers: Sit to/from Stand Sit to Stand: Min guard         General transfer comment: cues for LE management and use of UEs to self assist  Ambulation/Gait Ambulation/Gait assistance: Min assist;Min guard Ambulation Distance (Feet): 75 Feet Assistive device: Rolling walker (2 wheeled) Gait Pattern/deviations: Step-to pattern;Decreased step length - right;Decreased step length - left;Shuffle Gait velocity: decr   General Gait Details: cues for sequence, posture and position from [employer]            Wheelchair Mobility    Modified Rankin (Stroke Patients Only)       Balance                                             Pertinent Vitals/Pain Pain Assessment: 0-10 Pain Score: 4  Pain Location: R knee Pain Descriptors / Indicators: Aching;Sore Pain Intervention(s): Limited activity within patient's tolerance;Monitored during session;Premedicated before  session;Ice applied    Home Living Family/patient expects to be discharged to:: Private residence Living Arrangements: Spouse/significant other Available Help at Discharge: Family Type of Home: House Home Access: Stairs to enter   Technical brewer of Steps: 1 Home Layout: One level Home Equipment: Nurse, children's - 4 wheels      Prior Function Level of Independence: Independent               Hand Dominance        Extremity/Trunk Assessment   Upper Extremity Assessment: Overall WFL for tasks assessed           Lower Extremity Assessment: RLE deficits/detail RLE Deficits / Details: 3/5 quads with IND SLR and AAROM -10 - 40    Cervical / Trunk Assessment: Normal  Communication   Communication: No difficulties  Cognition Arousal/Alertness: Awake/alert Behavior During Therapy: WFL for tasks assessed/performed Overall Cognitive Status: Within Functional Limits for tasks assessed                      General Comments      Exercises Total Joint Exercises Ankle Circles/Pumps: AROM;20 reps;Supine;Both Quad Sets: AROM;Both;10 reps;Supine Heel Slides: AAROM;Right;15 reps;Supine Straight Leg Raises: AAROM;AROM;Right;20 reps;Supine      Assessment/Plan    PT Assessment Patient needs continued PT services  PT Diagnosis Difficulty walking   PT Problem List Decreased strength;Decreased range of motion;Decreased activity tolerance;Decreased mobility;Decreased knowledge  of use of DME;Pain;Obesity  PT Treatment Interventions DME instruction;Gait training;Stair training;Functional mobility training;Therapeutic activities;Therapeutic exercise;Patient/family education   PT Goals (Current goals can be found in the Care Plan section) Acute Rehab PT Goals Patient Stated Goal: Resume previous lifestyle wtih decreased pain PT Goal Formulation: With patient Time For Goal Achievement: 05/21/15 Potential to Achieve Goals: Good    Frequency 7X/week    Barriers to discharge        Co-evaluation               End of Session Equipment Utilized During Treatment: Gait belt Activity Tolerance: Patient tolerated treatment well Patient left: in chair;with call bell/phone within reach Nurse Communication: Mobility status         Time: [id number] PT Time Calculation (min) (ACUTE ONLY): 39 min   Charges:   PT Evaluation $PT Eval Low Complexity: 1 Procedure PT Treatments $Gait Training: 8-22 mins $Therapeutic Exercise: 8-22 mins   PT G Codes:        [name] May 28, 2015, 12:33 PM

## 2015-05-20 DIAGNOSIS — E669 Obesity, unspecified: Secondary | ICD-10-CM | POA: Diagnosis present

## 2015-05-20 NOTE — Discharge Summary (Signed)
Physician Discharge Summary  Patient ID: Rebekah [name] MRN: [id number] DOB/AGE: [birth year] 60 y.o.  Admit date: 05/17/2015 Discharge date: 05/18/2015   Procedures:  Procedure(s) (LRB): RIGHT TOTAL KNEE ARTHROPLASTY (Right)  Attending Physician:  Dr. Paralee Cancel   Admission Diagnoses:   Right knee primary OA / pain  Discharge Diagnoses:  Principal Problem:   S/P right TKA Active Problems:   S/P knee replacement   Obese  Past Medical History  Diagnosis Date  . Arthritis   . Hypertension   . GERD (gastroesophageal reflux disease)   . Obesity     s/p gastric sleeve 12/2013 (previously weighed close to 400 lbs)  . Complication of anesthesia   . PONV (postoperative nausea and vomiting)   . Shortness of breath     WITH EXERTION - uses 02 3L nightly / 5 L  other times with exertion ,  resolved with bariatric surgery and weight loss  . Environmental allergies   . Incontinence of urine     at nite   . Difficulty sleeping     prior sleep study did not reveal sleep apnea per patient  . Paroxysmal atrial fibrillation (HCC)   . Left atrial dilatation   . DJD (degenerative joint disease)   . Heart murmur   . Supplemental oxygen dependent     uses 3l of oxygen at nite when lying flat   . Pneumonia     hx of   . Tuberculosis     had 6 month of INH due to exposure   . Hypothyroidism     HPI:    Rebekah [name], 60 y.o. female, has a history of pain and functional disability in the right knee due to arthritis and has failed non-surgical conservative treatments for greater than 12 weeks to include NSAID's and/or analgesics, corticosteriod injections, viscosupplementation injections, use of assistive devices and activity modification. Onset of symptoms was gradual, starting >10 years ago with gradually worsening course since that time. The patient noted no past surgery on the right knee(s). Patient currently rates pain in the right knee(s) at 8 out of 10 with activity. Patient has night  pain, worsening of pain with activity and weight bearing, pain that interferes with activities of daily living, pain with passive range of motion, crepitus and joint swelling. Patient has evidence of periarticular osteophytes and joint space narrowing by imaging studies. There is no active infection. Risks, benefits and expectations were discussed with the patient. Risks including but not limited to the risk of anesthesia, blood clots, nerve damage, blood vessel damage, failure of the prosthesis, infection and up to and including death. Patient understand the risks, benefits and expectations and wishes to proceed with surgery.   PCP: Terald Sleeper, PA-C   Discharged Condition: good  Hospital Course:  Patient underwent the above stated procedure on 05/17/2015. Patient tolerated the procedure well and brought to the recovery room in good condition and subsequently to the floor.  POD #1 BP: 149/79 ; Pulse: 60 ; Temp: 98.7 F (37.1 C) ; Resp: 16 Patient reports pain as mild, pain controlled. No events throughout the night. Ready to work with PT and get this knee well. Discussed the use of ACE bandages in the place of TED hose. Ready to be discharged home. Dorsiflexion/plantar flexion intact, incision: dressing C/D/I, no cellulitis present and compartment soft.   LABS  Basename    HGB     9.6  HCT     29.7    Discharge  Exam: General appearance: alert, cooperative and no distress Extremities: Homans sign is negative, no sign of DVT, no edema, redness or tenderness in the calves or thighs and no ulcers, gangrene or trophic changes  Disposition: Home with follow up in 2 weeks   Follow-up Information    Follow up with Mauri Pole, MD. Schedule an appointment as soon as possible for a visit in 2 weeks.   Specialty:  Orthopedic Surgery   Contact information:   [address] [place] [address] [phone]       Follow up with [facility].   Why:  home  health physical therapy   Contact information:   [address] [place] [address] [phone]       Discharge Instructions    Call MD / Call 911    Complete by:  As directed   If you experience chest pain or shortness of breath, CALL 911 and be transported to the hospital emergency room.  If you develope a fever above 101 F, pus (white drainage) or increased drainage or redness at the wound, or calf pain, call your surgeon's office.     Change dressing    Complete by:  As directed   Maintain surgical dressing until follow up in the clinic. If the edges start to pull up, may reinforce with tape. If the dressing is no longer working, may remove and cover with gauze and tape, but must keep the area dry and clean.  Call with any questions or concerns.     Constipation Prevention    Complete by:  As directed   Drink plenty of fluids.  Prune juice may be helpful.  You may use a stool softener, such as Colace (over the counter) 100 mg twice a day.  Use MiraLax (over the counter) for constipation as needed.     Diet - low sodium heart healthy    Complete by:  As directed      Discharge instructions    Complete by:  As directed   Maintain surgical dressing until follow up in the clinic. If the edges start to pull up, may reinforce with tape. If the dressing is no longer working, may remove and cover with gauze and tape, but must keep the area dry and clean.  Follow up in 2 weeks at [facility]. Call with any questions or concerns.     Increase activity slowly as tolerated    Complete by:  As directed   Weight bearing as tolerated with assist device (walker, cane, etc) as directed, use it as long as suggested by your surgeon or therapist, typically at least 4-6 weeks.     TED hose    Complete by:  As directed   Use stockings (TED hose) for 2 weeks on both leg(s).  You may remove them at night for sleeping.             Medication List    STOP taking these  medications        traMADol 50 MG tablet  Commonly known as:  ULTRAM      TAKE these medications        acetaminophen 500 MG tablet  Commonly known as:  TYLENOL  Take 500-1,000 mg by mouth every 6 (six) hours as needed for moderate pain or headache.     BIOTIN PO  Take 5,000 mcg by mouth daily.     CALCIUM PO  Take 1,000 mg by mouth daily.  cetirizine 10 MG tablet  Commonly known as:  ZYRTEC  Take 10 mg by mouth at bedtime.     ciprofloxacin 500 MG tablet  Commonly known as:  CIPRO  Take 500 mg by mouth 2 (two) times daily.     diltiazem 120 MG 24 hr capsule  Commonly known as:  DILACOR XR  Take 1 capsule (120 mg total) by mouth daily.     diltiazem 30 MG tablet  Commonly known as:  CARDIZEM  Take 1-2 tablets by mouth every 6 hours as needed for fast heart rates     docusate sodium 100 MG capsule  Commonly known as:  COLACE  Take 1 capsule (100 mg total) by mouth 2 (two) times daily.     EPIPEN 0.3 mg/0.3 mL Soaj injection  Generic drug:  EPINEPHrine  Inject 0.3 mg into the muscle once as needed (anaphylaxis).     escitalopram 20 MG tablet  Commonly known as:  LEXAPRO  Take 10 mg by mouth every morning.     ferrous sulfate 325 (65 FE) MG tablet  Take 1 tablet (325 mg total) by mouth 3 (three) times daily after meals.     flecainide 100 MG tablet  Commonly known as:  TAMBOCOR  Take 3 tablets by mouth at onset of fast heart rates as needed     HYDROmorphone 2 MG tablet  Commonly known as:  DILAUDID  Take 1-2 tablets (2-4 mg total) by mouth every 4 (four) hours as needed for severe pain.     levothyroxine 50 MCG tablet  Commonly known as:  SYNTHROID, LEVOTHROID  Take 50 mcg by mouth daily before breakfast.     methocarbamol 500 MG tablet  Commonly known as:  ROBAXIN  Take 1 tablet (500 mg total) by mouth every 6 (six) hours as needed for muscle spasms.     MULTIVITAMIN ADULT PO  Take 1 capsule by mouth 2 (two) times daily. Journey 3+3 Bariatric  Multivitamin     OXYGEN  Inhale 3 L/min into the lungs at bedtime. Continuously     polyethylene glycol packet  Commonly known as:  MIRALAX / GLYCOLAX  Take 17 g by mouth 2 (two) times daily.     rivaroxaban 20 MG Tabs tablet  Commonly known as:  XARELTO  Take 1 tablet (20 mg total) by mouth daily with supper.     VITAMIN B 12 PO  Take 500 mcg by mouth daily.         Signed: [place] Pugh. Irini Leet   PA-C  05/20/2015, 12:51 PM

## 2015-05-30 ENCOUNTER — Ambulatory Visit (INDEPENDENT_AMBULATORY_CARE_PROVIDER_SITE_OTHER): Payer: BC Managed Care – PPO | Admitting: Internal Medicine

## 2015-05-30 ENCOUNTER — Telehealth: Payer: Self-pay | Admitting: Internal Medicine

## 2015-05-30 ENCOUNTER — Encounter: Payer: Self-pay | Admitting: Internal Medicine

## 2015-05-30 VITALS — BP 122/70 | HR 81 | Ht 62.0 in | Wt 184.9 lb

## 2015-05-30 DIAGNOSIS — I480 Paroxysmal atrial fibrillation: Secondary | ICD-10-CM

## 2015-05-30 DIAGNOSIS — I10 Essential (primary) hypertension: Secondary | ICD-10-CM | POA: Diagnosis not present

## 2015-05-30 DIAGNOSIS — R600 Localized edema: Secondary | ICD-10-CM | POA: Diagnosis not present

## 2015-05-30 MED ORDER — FUROSEMIDE 20 MG PO TABS: 20.0000 mg | ORAL_TABLET | Freq: Every day | ORAL | Status: DC | PRN

## 2015-05-30 MED ORDER — POTASSIUM CHLORIDE CRYS ER 20 MEQ PO TBCR: 20.0000 meq | EXTENDED_RELEASE_TABLET | Freq: Every day | ORAL | Status: DC | PRN

## 2015-05-30 MED FILL — HYDROmorphone HCL 2 MG TABS: 2 | 15 days supply | Qty: 60 | Fill #0

## 2015-05-30 NOTE — Telephone Encounter (Signed)
Rebekah [name] is calling in stating that the pt came in to office with Afib and a HR spiking up to 200bpm. She would like for the pt to be seen today.

## 2015-05-30 NOTE — Telephone Encounter (Signed)
Spoke to [place] Patient is seeing  [place] PA, for post op follow up  Patient is in afb  Rate up to 200 beats prior to office visit per L[place] Sousa Would like for patient to be evaluated today since she lives in [place]? Reviewed with Dr Debara Pickett. Schedule appointment for today , patient will be notified by [facility]

## 2015-05-30 NOTE — Patient Instructions (Signed)
Your physician has recommended you make the following change in your medication...  1. Dr. Debara Pickett has prescribed FUROSEMIDE (LASIX) 20mg  to take [place] for swelling  >> if you take lasix, please take POTASSIUM 72mEq with this   Please keep scheduled appointment

## 2015-05-30 NOTE — Progress Notes (Signed)
OFFICE NOTE  Chief Complaint:  Paroxysmal tachycardia   Primary Care Physician: Terald Sleeper, PA-C  HPI:  Rebekah [name] is a pleasant 60 year old female with a past medical history significant for her super morbid obesity, chronic hypoxia, hypertension, GERD and prior knee replacement. She was previously followed by Dr. Rollene Fare and was being evaluated for bariatric surgery. She is here to establish new care with me today and for preoperative evaluation. 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 She appears he seen Dr. Rollene Fare will order an echocardiogram on August of 2014 which showed moderate thickening of the left ventricle with EF of 60-65%, mildly increased RV systolic pressure and no significant valvular disease. There was left atrial enlargement. She denies any chest pain but does have expected shortness of breath with exertion. Lower extremity venous Dopplers were performed which show no evidence of significant venous insufficiency.    Rebekah [name] returns today for follow-up. She seems to be doing remarkably well. She underwent gastric sleeve surgery and has had close to 60 pound weight loss. Unfortunately she was complicated by postoperative atrial fibrillation. She was seen by Dr. Martinique and placed on Cardizem. Subsequently she converted to sinus rhythm within 24 hours spontaneously. She's had no recurrence since that time and has not been additionally anticoagulated. She was not continued on Cardizem. She does have an elevated CHADSVASC score of 2.  I saw Rebekah [name] back in the office today. Please see her recent notes when I saw her in the emergency room at [facility]. She was noted to be in atrial fibrillation again and given flecainide to help convert her, but this was not successful. She is placed on diltiazem and in addition to  metoprolol. Rate slowed down and eventually she cardioverted on her own. We also started her on Xarelto which she's continues to take. Overall she's had very good rate control his heart rate is remained in the mid 40s to mid 60s. Blood pressure is somewhat labile and ranges between the low 90s up to the 140s. Overall she feels fairly well. She still has a few episodes of palpitations during the week when she feels that she is in A. fib although she feels like she is in A. fib today and her rhythm is sinus. She may not be sensitive to A. fib anymore. 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  I saw Rebekah [name] back in the office today. She's had remarkable weight loss. In fact her weight after sleeve gastrectomy is down 288 pounds. Her main issue now is excess tissue for which she's tried to get plastic surgery but has been denied multiple times. She reports no further palpitations or atrial fibrillation. She continues to take Xarelto without any bleeding complications. She continues to use nocturnal oxygen due to hypoxia, although her sleep study indicated no sleep apnea, her overnight oximetry study did indicate hypoxia with a low O2 sats ration of 86%. This was, of course prior to the recent 80-100 pound weight loss. She also reports recently she's been having epistaxis. In fact she had an episode that lasted for 10-12 hours, she used a box of 40 micro-tampons to try to get it to stop and then eventually tried to go to the emergency room. She is using nasal saline but feels that the oxygen at night is probably  drying her nose out.  Rebekah [name] was seen today for an urgent add-on. This morning she was awakened by her alarm clock around 5 AM and noticed that her heart was racing when she woke up. It did not slow down right away and she felt increasingly more fatigue. She said after taking a shower she felt dizzy and had some chest pressure. Her heart rate was very elevated and she tried to take  it noting it was "greater than 200". She decided then take 300 mg of flecainide. Subsequently she took 2 diltiazem's and after several hours he decided to come in for her appointment with the orthopedist. She says that on the way in to the office her heart rate slowed down and she felt better. When she saw her orthopedist they felt that she should be reevaluated by [name] here. Currently she is asymptomatic. EKG was performed showing sinus rhythm with sinus arrhythmia at 81.  PMHx:  Past Medical History  Diagnosis Date  . Arthritis   . Hypertension   . GERD (gastroesophageal reflux disease)   . Obesity     s/p gastric sleeve 12/2013 (previously weighed close to 400 lbs)  . Complication of anesthesia   . PONV (postoperative nausea and vomiting)   . Shortness of breath     WITH EXERTION - uses 02 3L nightly / 5 L  other times with exertion ,  resolved with bariatric surgery and weight loss  . Environmental allergies   . Incontinence of urine     at nite   . Difficulty sleeping     prior sleep study did not reveal sleep apnea per patient  . Paroxysmal atrial fibrillation (HCC)   . Left atrial dilatation   . DJD (degenerative joint disease)   . Heart murmur   . Supplemental oxygen dependent     uses 3l of oxygen at nite when lying flat   . Pneumonia     hx of   . Tuberculosis     had 6 month of INH due to exposure   . Hypothyroidism     Past Surgical History  Procedure Laterality Date  . Tonsillectomy    . Cholecystectomy    . Appendectomy    . Tubal ligation    . Transthoracic echocardiogram  11/2012    EF 60-65%, mod LVH & mod conc hypertrophy, grade 1 diastolic dysfunction; LA mildly dilated; RV systolic pressure increased; PA peak pressure 19mmHg  . Joint replacement  1999    L TOTAL KNEE  . Laparoscopic gastric sleeve resection N/A 01/04/2014    Procedure: LAPAROSCOPIC GASTRIC SLEEVE RESECTION;  Surgeon: Greer Pickerel, MD;  Location: [facility];  Service: General;  Laterality: N/A;  .  Upper gi endoscopy  01/04/2014    Procedure: UPPER GI ENDOSCOPY;  Surgeon: Greer Pickerel, MD;  Location: [facility];  Service: General;;  . Eye surgery      03/2014 lens implant left lens   . Total knee arthroplasty Right 05/17/2015    Procedure: RIGHT TOTAL KNEE ARTHROPLASTY;  Surgeon: Paralee Cancel, MD;  Location: [facility];  Service: Orthopedics;  Laterality: Right;    FAMHx:  Family History  Problem Relation Age of Onset  . Diabetes Father   . Diabetes Maternal Grandmother   . Uterine cancer Mother   . Cervical cancer Mother   . Breast cancer Mother   . Cancer Mother     cervical and brreast cancer    SOCHx:   reports that she has never smoked.  She has never used smokeless tobacco. She reports that she does not drink alcohol or use illicit drugs.  ALLERGIES:  Allergies  Allergen Reactions  . Mucinex [Guaifenesin Er] Anaphylaxis  . Ace Inhibitors Swelling and Cough    Pedal Edema  . Codeine Nausea Only and Other (See Comments)    Stomach Ache, face itches  . Propoxyphene Hcl Other (See Comments)    REACTION: hallucinate- [place]  . Tramadol Itching and Other (See Comments)    Severe abdominal pain- can stand when she eats.   Marland Kitchen Hydromet [Hydrocodone-Homatropine] Itching and Other (See Comments)    Severe stomach pain-face itches.   . Sulfonamide Derivatives Rash    ROS: Pertinent items noted in HPI and remainder of comprehensive ROS otherwise negative.  HOME MEDS: Current Outpatient Prescriptions  Medication Sig Dispense Refill  . acetaminophen (TYLENOL) 500 MG tablet Take 500-1,000 mg by mouth every 6 (six) hours as needed for moderate pain or headache.    Marland Kitchen BIOTIN PO Take 5,000 mcg by mouth daily.     Marland Kitchen CALCIUM PO Take 1,000 mg by mouth daily.    . cetirizine (ZYRTEC) 10 MG tablet Take 10 mg by mouth at bedtime.     . Cyanocobalamin (VITAMIN B 12 PO) Take 500 mcg by mouth daily.    Marland Kitchen diltiazem (CARDIZEM) 30 MG tablet Take 1-2 tablets by mouth every 6 hours as needed for fast  heart rates 30 tablet 1  . diltiazem (DILACOR XR) 120 MG 24 hr capsule Take 1 capsule (120 mg total) by mouth daily. 90 capsule 3  . docusate sodium (COLACE) 100 MG capsule Take 1 capsule (100 mg total) by mouth 2 (two) times daily. 10 capsule 0  . EPINEPHrine (EPIPEN) 0.3 mg/0.3 mL DEVI Inject 0.3 mg into the muscle once as needed (anaphylaxis).     Marland Kitchen escitalopram (LEXAPRO) 20 MG tablet Take 10 mg by mouth every morning.     . flecainide (TAMBOCOR) 100 MG tablet Take 3 tablets by mouth at onset of fast heart rates as needed 30 tablet 1  . HYDROmorphone (DILAUDID) 2 MG tablet Take 1-2 tablets (2-4 mg total) by mouth every 4 (four) hours as needed for severe pain. 100 tablet 0  . levothyroxine (SYNTHROID, LEVOTHROID) 50 MCG tablet Take 50 mcg by mouth daily before breakfast.    . methocarbamol (ROBAXIN) 500 MG tablet Take 1 tablet (500 mg total) by mouth every 6 (six) hours as needed for muscle spasms. 40 tablet 0  . Multiple Vitamins-Minerals (MULTIVITAMIN ADULT PO) Take 1 capsule by mouth 2 (two) times daily. Journey 3+3 Bariatric Multivitamin    . OXYGEN Inhale 3 L/min into the lungs at bedtime. Continuously    . rivaroxaban (XARELTO) 20 MG TABS tablet Take 1 tablet (20 mg total) by mouth daily with supper. 90 tablet 3  . furosemide (LASIX) 20 MG tablet Take 1 tablet (20 mg total) by mouth daily as needed for edema. 30 tablet 3  . potassium chloride SA (K-DUR,KLOR-CON) 20 MEQ tablet Take 1 tablet (20 mEq total) by mouth daily as needed (take when taking lasix). 30 tablet 3   No current facility-administered medications for this visit.    LABS/IMAGING: No results found for this or any previous visit (from the past 48 hour(s)). No results found.  VITALS: BP 122/70 mmHg  Pulse 81  Ht 5\' 2"  (1.575 m)  Wt 184 lb 14.4 oz (83.87 kg)  BMI 33.81 kg/m2  EXAM: General appearance: alert, no distress and moderately obese  Lungs: diminished breath sounds bilaterally Heart: regular rate and rhythm,  S1, S2 normal, no murmur, click, rub or gallop Skin: Skin color, texture, turgor normal. No rashes or lesions Neurologic: Grossly normal  EKG: Normal sinus rhythm at 81  ASSESSMENT: 1. Paroxysmal atrial fibrillation, recurrent- on pocket pill therapy 2. Bilateral leg edema  PLAN: 1.    Rebekah [name] had an episode this morning of paroxysmal atrial fibrillation. She treated appropriately with pocket pill flecainide and Cardizem. She converted back to sinus rhythm within an hour and seems to be feeling better. I encouraged her to continue with the same strategy of management and if she has more frequent episodes, will need to refer back to Dr. Rayann Heman for consideration of ablation or more regular antiarrhythmic use. She has been appropriately anticoagulated on Xarelto and I encouraged her to continue taking that without fail. She is also reporting worsening lower extremity edema today. She has a very firm right lower extremity. It is not thought that she has DVT. She had an Ace bandage wrapped around it from her orthopedist today. She previously took Lasix for swelling. Her current pills are more than 61 year old. I'll give her a new prescription for Lasix and potassium. She has follow-up with me next month.  Pixie Casino, MD, [facility] Attending Cardiologist Turner C Hilty 05/30/2015, 6:15 PM

## 2015-06-07 ENCOUNTER — Ambulatory Visit: Payer: BC Managed Care – PPO | Attending: Orthopedic Surgery | Admitting: Physical Therapy

## 2015-06-07 DIAGNOSIS — M25461 Effusion, right knee: Secondary | ICD-10-CM | POA: Diagnosis present

## 2015-06-07 DIAGNOSIS — R5381 Other malaise: Secondary | ICD-10-CM | POA: Insufficient documentation

## 2015-06-07 DIAGNOSIS — R29898 Other symptoms and signs involving the musculoskeletal system: Secondary | ICD-10-CM | POA: Insufficient documentation

## 2015-06-07 DIAGNOSIS — M25661 Stiffness of right knee, not elsewhere classified: Secondary | ICD-10-CM

## 2015-06-07 DIAGNOSIS — M25561 Pain in right knee: Secondary | ICD-10-CM | POA: Insufficient documentation

## 2015-06-07 NOTE — Therapy (Signed)
[facility] [place], [place], [address] Phone: [phone]   Fax:  [phone]  Physical Therapy Evaluation  Patient Details  Name: Rebekah [name] MRN: [id number] Date of Birth: 1955/11/10 Referring Provider: Paralee Cancel MD.  Encounter Date: 06/07/2015      PT End of Session - 06/07/15 1213    Visit Number 1   Number of Visits 12   Date for PT Re-Evaluation 07/12/15   PT Start Time 0958   PT Stop Time 1048   PT Time Calculation (min) 50 min      Past Medical History  Diagnosis Date  . Arthritis   . Hypertension   . GERD (gastroesophageal reflux disease)   . Obesity     s/p gastric sleeve 12/2013 (previously weighed close to 400 lbs)  . Complication of anesthesia   . PONV (postoperative nausea and vomiting)   . Shortness of breath     WITH EXERTION - uses 02 3L nightly / 5 L  other times with exertion ,  resolved with bariatric surgery and weight loss  . Environmental allergies   . Incontinence of urine     at nite   . Difficulty sleeping     prior sleep study did not reveal sleep apnea per patient  . Paroxysmal atrial fibrillation (HCC)   . Left atrial dilatation   . DJD (degenerative joint disease)   . Heart murmur   . Supplemental oxygen dependent     uses 3l of oxygen at nite when lying flat   . Pneumonia     hx of   . Tuberculosis     had 6 month of INH due to exposure   . Hypothyroidism     Past Surgical History  Procedure Laterality Date  . Tonsillectomy    . Cholecystectomy    . Appendectomy    . Tubal ligation    . Transthoracic echocardiogram  11/2012    EF 60-65%, mod LVH & mod conc hypertrophy, grade 1 diastolic dysfunction; LA mildly dilated; RV systolic pressure increased; PA peak pressure 58mmHg  . Joint replacement  1999    L TOTAL KNEE  . Laparoscopic gastric sleeve resection N/A 01/04/2014    Procedure: LAPAROSCOPIC GASTRIC SLEEVE RESECTION;  Surgeon: Greer Pickerel, MD;  Location: [facility];  Service: General;  Laterality: N/A;  . Upper gi endoscopy  01/04/2014    Procedure: UPPER GI ENDOSCOPY;  Surgeon: Greer Pickerel, MD;  Location: [facility];  Service: General;;  . Eye surgery      03/2014 lens implant left lens   . Total knee arthroplasty Right 05/17/2015    Procedure: RIGHT TOTAL KNEE ARTHROPLASTY;  Surgeon: Paralee Cancel, MD;  Location: [facility];  Service: Orthopedics;  Laterality: Right;    There were no vitals filed for this visit.  Visit Diagnosis:  Right knee pain - Plan: PT plan of care cert/re-cert  Swelling of right knee joint - Plan: PT plan of care cert/re-cert  Decreased range of motion of right knee - Plan: PT plan of care cert/re-cert  Debility - Plan: PT plan of care cert/re-cert      Subjective Assessment - 06/07/15 1223    Subjective pain about a 2-3/10 right now at rest.   Limitations Standing   Patient Stated Goals Get back to normal life.   Pain Score 3    Pain Location Knee   Pain Orientation Right   Pain Descriptors / Indicators Aching   Pain Type Surgical  pain   Pain Frequency Constant            [facility] PT Assessment - 06/07/15 0001    Assessment   Medical Diagnosis Right total knee replacement.   Referring Provider Paralee Cancel MD.   Onset Date/Surgical Date --  05/17/15 (surgery date).   Precautions   Precautions --  No ultrasound.   Restrictions   Weight Bearing Restrictions No   Balance Screen   Has the patient fallen in the past 6 months Yes   How many times? --  1   Has the patient had a decrease in activity level because of a fear of falling?  No   Is the patient reluctant to leave their home because of a fear of falling?  No   Home Environment   Living Environment Private residence   Prior Function   Level of Independence Independent   Observation/Other Assessments   Observations Right knee incision looks good.  She is using to ACE wraps to help control swelling.   Observation/Other Assessments-Edema    Edema  Circumferential   Circumferential Edema   Circumferential - Right 12.5 cms > left   ROM / Strength   AROM / PROM / Strength AROM;Strength   AROM   Overall AROM Comments -8 degrees and passive extension of right knee= -3 degrees with active flexion= 79 degres and 85 degrees of passive flexion.   Strength   Overall Strength Comments Right hip strength= 3=/5 and right knee pain= 4-/5.   Palpation   Palpation comment Mild tenderness on either side of the patient's right knee.incisional site.   Ambulation/Gait   Gait Pattern Decreased stance time - right;Antalgic                   [facility] Adult PT Treatment/Exercise - 06/07/15 0001    Modalities   Modalities Vasopneumatic   Vasopneumatic   Number Minutes Vasopneumatic  15 minutes   Vasopnuematic Location  --  Right knee.   Vasopneumatic Pressure Medium                     PT Long Term Goals - 06/07/15 1235    PT LONG TERM GOAL #1   Title Ind with a HEP.   Time 4   Period Weeks   Status New   PT LONG TERM GOAL #2   Title Decrease edema to within 4 cms of contralateral side to assist with range of motion gains and decrease pain.   Time 4   Period Weeks   Status New   PT LONG TERM GOAL #3   Title Full active right knee extension in order to normalize gait   Time 4   Period Weeks   Status New   PT LONG TERM GOAL #4   Title Active knee flexion to 115 degrees+ so the patient can perform functional tasks and do so with pain not > 2-3/10.   Time 4   Period Weeks   Status New   PT LONG TERM GOAL #5   Title Increase right  knee strength to a solid 4+/5 to provide good stability for accomplishment of functional activities   Time 4   Period Weeks   Status New   Additional Long Term Goals   Additional Long Term Goals Yes   PT LONG TERM GOAL #6   Title Perform a reciprocating stair gait with one railing with pain not > 2-3/10.   Time 4   Period Weeks  Status New               Plan - 06/07/15  1225    Clinical Impression Statement The patient underwent a right total knee replacement on 05/17/15.  She reports she had a gastric sleeve surgery on 01/04/14.  As a result of this she reports a great deal of loose skin.  This has caused a great deal of right knee swelling.  She has a lot of swelling today but reports it was even worse than this.  She had home health physical therapy and is compliant to these exercises.  She reports a resting pain-level of  2-3/10 and higher (5-6+/10) with range of motion exercises.  Her pain also increases with prolonged standing.  The patient states when she is not doing her exercises her Dr. recommends that she lie down and keep her feet elevated.  She also states that she has a pressure sore on a buttock and her husband is treatment this for her..   Pt will benefit from skilled therapeutic intervention in order to improve on the following deficits Abnormal gait;Decreased activity tolerance;Pain;Decreased range of motion;Increased edema;Decreased strength   Rehab Potential Excellent   PT Frequency 3x / week   PT Duration 4 weeks   PT Treatment/Interventions ADLs/Self Care Home Management;Cryotherapy;Electrical Stimulation;Therapeutic exercise;Moist Heat;Therapeutic activities;Neuromuscular re-education;Patient/family education;Manual techniques;Vasopneumatic Device;Passive range of motion   PT Next Visit Plan Total knee replacement protocol. No ultrasound.  Vasopneumatic and electrical stimulatiom.   Consulted and Agree with Plan of Care Patient         Problem List Patient Active Problem List   Diagnosis Date Noted  . Obese 05/20/2015  . S/P right TKA 05/17/2015  . S/P knee replacement 05/17/2015  . Paroxysmal a-fib ([place]) 07/20/2014  . Prediabetes 01/06/2014  . Dyslipidemia 01/06/2014  . OA (osteoarthritis) 01/06/2014  . S/P laparoscopic sleeve gastrectomy 01/04/14 01/06/2014  . Postoperative atrial fibrillation - resolved 01/06/2014  . Morbid obesity  ([place]) 01/04/2014  . Morbid obesity with BMI of 60.0-69.9, adult ([place]) 10/28/2013  . Dyspnea on exertion 09/05/2012  . Essential hypertension 04/21/2007  . Hermina Staggers 04/21/2007    Elim Peale, [place] MPT 06/07/2015, 12:58 PM  [facility] [address] [place], [place], [address] Phone: [phone]   Fax:  [phone]  Name: MAKEL HALLIGAN MRN: [id number] Date of Birth: [birth year]

## 2015-06-09 ENCOUNTER — Ambulatory Visit: Payer: BC Managed Care – PPO | Admitting: Physical Therapy

## 2015-06-09 DIAGNOSIS — M25461 Effusion, right knee: Secondary | ICD-10-CM

## 2015-06-09 DIAGNOSIS — M25561 Pain in right knee: Secondary | ICD-10-CM | POA: Diagnosis not present

## 2015-06-09 DIAGNOSIS — R5381 Other malaise: Secondary | ICD-10-CM

## 2015-06-09 DIAGNOSIS — M25661 Stiffness of right knee, not elsewhere classified: Secondary | ICD-10-CM

## 2015-06-09 NOTE — Therapy (Signed)
[facility] [place], [place], [address] Phone: [phone]   Fax:  [phone]  Physical Therapy Treatment  Patient Details  Name: Rebekah [name] MRN: [id number] Date of Birth: 04-Dec-1955 Referring Provider: Paralee Cancel MD.  Encounter Date: 06/09/2015      PT End of Session - 06/09/15 1638    Visit Number 2   Number of Visits 12   Date for PT Re-Evaluation 07/12/15   PT Start Time 0315   PT Stop Time 0432   PT Time Calculation (min) 77 min      Past Medical History  Diagnosis Date  . Arthritis   . Hypertension   . GERD (gastroesophageal reflux disease)   . Obesity     s/p gastric sleeve 12/2013 (previously weighed close to 400 lbs)  . Complication of anesthesia   . PONV (postoperative nausea and vomiting)   . Shortness of breath     WITH EXERTION - uses 02 3L nightly / 5 L  other times with exertion ,  resolved with bariatric surgery and weight loss  . Environmental allergies   . Incontinence of urine     at nite   . Difficulty sleeping     prior sleep study did not reveal sleep apnea per patient  . Paroxysmal atrial fibrillation (HCC)   . Left atrial dilatation   . DJD (degenerative joint disease)   . Heart murmur   . Supplemental oxygen dependent     uses 3l of oxygen at nite when lying flat   . Pneumonia     hx of   . Tuberculosis     had 6 month of INH due to exposure   . Hypothyroidism     Past Surgical History  Procedure Laterality Date  . Tonsillectomy    . Cholecystectomy    . Appendectomy    . Tubal ligation    . Transthoracic echocardiogram  11/2012    EF 60-65%, mod LVH & mod conc hypertrophy, grade 1 diastolic dysfunction; LA mildly dilated; RV systolic pressure increased; PA peak pressure 59mmHg  . Joint replacement  1999    L TOTAL KNEE  . Laparoscopic gastric sleeve resection N/A 01/04/2014    Procedure: LAPAROSCOPIC GASTRIC SLEEVE RESECTION;  Surgeon: Greer Pickerel, MD;  Location: [facility];  Service: General;  Laterality: N/A;  . Upper gi endoscopy  01/04/2014    Procedure: UPPER GI ENDOSCOPY;  Surgeon: Greer Pickerel, MD;  Location: [facility];  Service: General;;  . Eye surgery      03/2014 lens implant left lens   . Total knee arthroplasty Right 05/17/2015    Procedure: RIGHT TOTAL KNEE ARTHROPLASTY;  Surgeon: Paralee Cancel, MD;  Location: [facility];  Service: Orthopedics;  Laterality: Right;    There were no vitals filed for this visit.  Visit Diagnosis:  Right knee pain  Swelling of right knee joint  Decreased range of motion of right knee  Debility      Subjective Assessment - 06/09/15 1639    Subjective i'm doing okay today.  The PA showed me 2 more exercises today.  One to straighten my knee and another to bend it.   Patient Stated Goals Get back to normal life.   Pain Score 3    Pain Location Knee   Pain Orientation Right   Pain Descriptors / Indicators Aching   Pain Type Surgical pain   Pain Frequency Constant  [facility] Adult PT Treatment/Exercise - 06/09/15 0001    Exercises   Exercises Knee/Hip;Ankle   Knee/Hip Exercises: Aerobic   Nustep Level 3 x 15 minutes moving seat forward x 1 to increase flexion.   Vasopneumatic   Number Minutes Vasopneumatic  20 minutes   Vasopnuematic Location  --  Right knee.   Vasopneumatic Pressure Medium   Manual Therapy   Manual Therapy Passive ROM   Manual therapy comments Gentle PROM/stretching in supine to patient's right knee in flexion and extension with long duration holds and STW on either side of the patient's incisional site but not over the incision.   Ankle Exercises: Standing   Rocker Board Limitations 4 minutes in parallel bars.                     PT Long Term Goals - 06/07/15 1235    PT LONG TERM GOAL #1   Title Ind with a HEP.   Time 4   Period Weeks   Status New   PT LONG TERM GOAL #2   Title Decrease edema to within 4 cms of contralateral side  to assist with range of motion gains and decrease pain.   Time 4   Period Weeks   Status New   PT LONG TERM GOAL #3   Title Full active right knee extension in order to normalize gait   Time 4   Period Weeks   Status New   PT LONG TERM GOAL #4   Title Active knee flexion to 115 degrees+ so the patient can perform functional tasks and do so with pain not > 2-3/10.   Time 4   Period Weeks   Status New   PT LONG TERM GOAL #5   Title Increase right  knee strength to a solid 4+/5 to provide good stability for accomplishment of functional activities   Time 4   Period Weeks   Status New   Additional Long Term Goals   Additional Long Term Goals Yes   PT LONG TERM GOAL #6   Title Perform a reciprocating stair gait with one railing with pain not > 2-3/10.   Time 4   Period Weeks   Status New               Problem List Patient Active Problem List   Diagnosis Date Noted  . Obese 05/20/2015  . S/P right TKA 05/17/2015  . S/P knee replacement 05/17/2015  . Paroxysmal a-fib ([place]) 07/20/2014  . Prediabetes 01/06/2014  . Dyslipidemia 01/06/2014  . OA (osteoarthritis) 01/06/2014  . S/P laparoscopic sleeve gastrectomy 01/04/14 01/06/2014  . Postoperative atrial fibrillation - resolved 01/06/2014  . Morbid obesity ([place]) 01/04/2014  . Morbid obesity with BMI of 60.0-69.9, adult ([place]) 10/28/2013  . Dyspnea on exertion 09/05/2012  . Essential hypertension 04/21/2007  . [name] 04/21/2007    Taralyn Ferraiolo, [place]  MPT 06/09/2015, 4:53 PM  [facility] [address] [place], [place], [address] Phone: [phone]   Fax:  [phone]  Name: STEPHINIE JOLLIE MRN: [id number] Date of Birth: 12/24/1955

## 2015-06-13 ENCOUNTER — Ambulatory Visit: Payer: BC Managed Care – PPO | Admitting: Physical Therapy

## 2015-06-13 ENCOUNTER — Encounter: Payer: Self-pay | Admitting: Physical Therapy

## 2015-06-13 DIAGNOSIS — M25561 Pain in right knee: Secondary | ICD-10-CM

## 2015-06-13 DIAGNOSIS — R5381 Other malaise: Secondary | ICD-10-CM

## 2015-06-13 DIAGNOSIS — M25461 Effusion, right knee: Secondary | ICD-10-CM

## 2015-06-13 DIAGNOSIS — M25661 Stiffness of right knee, not elsewhere classified: Secondary | ICD-10-CM

## 2015-06-13 NOTE — Therapy (Signed)
[facility] [place], [place], [address] Phone: [phone]   Fax:  [phone]  Physical Therapy Treatment  Patient Details  Name: Rebekah [name] MRN: [id number] Date of Birth: 03-12-1956 Referring Provider: Paralee Cancel MD.  Encounter Date: 06/13/2015      PT End of Session - 06/13/15 0953    Visit Number 3   Number of Visits 12   Date for PT Re-Evaluation 07/12/15   PT Start Time 0952   PT Stop Time 1035   PT Time Calculation (min) 43 min   Activity Tolerance Patient tolerated treatment well   Behavior During Therapy [facility] for tasks assessed/performed      Past Medical History  Diagnosis Date  . Arthritis   . Hypertension   . GERD (gastroesophageal reflux disease)   . Obesity     s/p gastric sleeve 12/2013 (previously weighed close to 400 lbs)  . Complication of anesthesia   . PONV (postoperative nausea and vomiting)   . Shortness of breath     WITH EXERTION - uses 02 3L nightly / 5 L  other times with exertion ,  resolved with bariatric surgery and weight loss  . Environmental allergies   . Incontinence of urine     at nite   . Difficulty sleeping     prior sleep study did not reveal sleep apnea per patient  . Paroxysmal atrial fibrillation (HCC)   . Left atrial dilatation   . DJD (degenerative joint disease)   . Heart murmur   . Supplemental oxygen dependent     uses 3l of oxygen at nite when lying flat   . Pneumonia     hx of   . Tuberculosis     had 6 month of INH due to exposure   . Hypothyroidism     Past Surgical History  Procedure Laterality Date  . Tonsillectomy    . Cholecystectomy    . Appendectomy    . Tubal ligation    . Transthoracic echocardiogram  11/2012    EF 60-65%, mod LVH & mod conc hypertrophy, grade 1 diastolic dysfunction; LA mildly dilated; RV systolic pressure increased; PA peak pressure 74mmHg  . Joint replacement  1999    L TOTAL KNEE  . Laparoscopic gastric sleeve  resection N/A 01/04/2014    Procedure: LAPAROSCOPIC GASTRIC SLEEVE RESECTION;  Surgeon: Greer Pickerel, MD;  Location: [facility];  Service: General;  Laterality: N/A;  . Upper gi endoscopy  01/04/2014    Procedure: UPPER GI ENDOSCOPY;  Surgeon: Greer Pickerel, MD;  Location: [facility];  Service: General;;  . Eye surgery      03/2014 lens implant left lens   . Total knee arthroplasty Right 05/17/2015    Procedure: RIGHT TOTAL KNEE ARTHROPLASTY;  Surgeon: Paralee Cancel, MD;  Location: [facility];  Service: Orthopedics;  Laterality: Right;    There were no vitals filed for this visit.  Visit Diagnosis:  Right knee pain  Swelling of right knee joint  Decreased range of motion of right knee  Debility      Subjective Assessment - 06/13/15 0953    Subjective States that pressure sore has now healed and she does not have A Fib symptoms today. Reports pain in posterior R knee and has anteriomedial knee hot spot.   Limitations Standing   Patient Stated Goals Get back to normal life.   Currently in Pain? Yes   Pain Score 3    Pain Location Knee  Pain Orientation Right   Pain Descriptors / Indicators Aching   Pain Type Surgical pain            [facility] PT Assessment - 06/13/15 0001    Assessment   Medical Diagnosis Right total knee replacement.   Onset Date/Surgical Date 05/17/15   Restrictions   Weight Bearing Restrictions No                     [facility] Adult PT Treatment/Exercise - 06/13/15 0001    Knee/Hip Exercises: Aerobic   Nustep Level 3 x 15 minutes moving seat forward x 1 to increase flexion.   Knee/Hip Exercises: Standing   Rocker Board 3 minutes   Knee/Hip Exercises: Supine   Short Arc Quad Sets AROM;Right;2 sets;10 reps;Other (comment)  5 sec hold at end range   Heel Slides AROM;Right;2 sets;10 reps   Modalities   Modalities Vasopneumatic   Vasopneumatic   Number Minutes Vasopneumatic  15 minutes   Vasopnuematic Location  Knee   Vasopneumatic Pressure Medium    Vasopneumatic Temperature  38                     PT Long Term Goals - 06/07/15 1235    PT LONG TERM GOAL #1   Title Ind with a HEP.   Time 4   Period Weeks   Status New   PT LONG TERM GOAL #2   Title Decrease edema to within 4 cms of contralateral side to assist with range of motion gains and decrease pain.   Time 4   Period Weeks   Status New   PT LONG TERM GOAL #3   Title Full active right knee extension in order to normalize gait   Time 4   Period Weeks   Status New   PT LONG TERM GOAL #4   Title Active knee flexion to 115 degrees+ so the patient can perform functional tasks and do so with pain not > 2-3/10.   Time 4   Period Weeks   Status New   PT LONG TERM GOAL #5   Title Increase right  knee strength to a solid 4+/5 to provide good stability for accomplishment of functional activities   Time 4   Period Weeks   Status New   Additional Long Term Goals   Additional Long Term Goals Yes   PT LONG TERM GOAL #6   Title Perform a reciprocating stair gait with one railing with pain not > 2-3/10.   Time 4   Period Weeks   Status New               Plan - 06/13/15 1021    Clinical Impression Statement Patient tolerated today's treatment well with no reports of increased pain with any exercises. Continues to be restricted with R knee flexion due to swelling and extra skin from weight loss. Presented in clnic with SPC and with ACE bandage around R knee to control RLE edema. Reports HEP complaince with exercises at home. Ambulated between exercises in gym without AD with antalgic gait and decreased R knee flexion. Normal vasopneumatic response noted following removal of the modalities. Patient experienced increased sensitivty to anteriomedial R knee hot spot following vasopneumatic system.   Pt will benefit from skilled therapeutic intervention in order to improve on the following deficits Abnormal gait;Decreased activity tolerance;Pain;Decreased range of  motion;Increased edema;Decreased strength   Rehab Potential Excellent   PT Frequency 3x / week   PT  Duration 4 weeks   PT Treatment/Interventions ADLs/Self Care Home Management;Cryotherapy;Electrical Stimulation;Therapeutic exercise;Moist Heat;Therapeutic activities;Neuromuscular re-education;Patient/family education;Manual techniques;Vasopneumatic Device;Passive range of motion   PT Next Visit Plan Total knee replacement protocol. No ultrasound.  Vasopneumatic and electrical stimulatiom.   Consulted and Agree with Plan of Care Patient        Problem List Patient Active Problem List   Diagnosis Date Noted  . Obese 05/20/2015  . S/P right TKA 05/17/2015  . S/P knee replacement 05/17/2015  . Paroxysmal a-fib ([place]) 07/20/2014  . Prediabetes 01/06/2014  . Dyslipidemia 01/06/2014  . OA (osteoarthritis) 01/06/2014  . S/P laparoscopic sleeve gastrectomy 01/04/14 01/06/2014  . Postoperative atrial fibrillation - resolved 01/06/2014  . Morbid obesity ([place]) 01/04/2014  . Morbid obesity with BMI of 60.0-69.9, adult ([place]) 10/28/2013  . Dyspnea on exertion 09/05/2012  . Essential hypertension 04/21/2007  . [name] 04/21/2007    Wynelle Fanny, PTA 06/13/2015, 10:39 AM  [facility] [address] [place], [place], [address] Phone: [phone]   Fax:  [phone]  Name: NATAIYA CZARNOWSKI MRN: [id number] Date of Birth: Dec 28, 1955

## 2015-06-15 ENCOUNTER — Encounter: Payer: Self-pay | Admitting: Physical Therapy

## 2015-06-15 ENCOUNTER — Ambulatory Visit: Payer: BC Managed Care – PPO | Attending: Orthopedic Surgery | Admitting: Physical Therapy

## 2015-06-15 DIAGNOSIS — M25661 Stiffness of right knee, not elsewhere classified: Secondary | ICD-10-CM

## 2015-06-15 DIAGNOSIS — R5381 Other malaise: Secondary | ICD-10-CM | POA: Insufficient documentation

## 2015-06-15 DIAGNOSIS — R29898 Other symptoms and signs involving the musculoskeletal system: Secondary | ICD-10-CM | POA: Insufficient documentation

## 2015-06-15 DIAGNOSIS — M25461 Effusion, right knee: Secondary | ICD-10-CM | POA: Insufficient documentation

## 2015-06-15 DIAGNOSIS — M25561 Pain in right knee: Secondary | ICD-10-CM | POA: Insufficient documentation

## 2015-06-15 NOTE — Therapy (Signed)
[facility] [place], [place], [address] Phone: [phone]   Fax:  [phone]  Physical Therapy Treatment  Patient Details  Name: Rebekah [name] MRN: [id number] Date of Birth: 1955/12/20 Referring Provider: Paralee Cancel MD.  Encounter Date: 06/15/2015      PT End of Session - 06/15/15 0943    Visit Number 4   Number of Visits 12   Date for PT Re-Evaluation 07/12/15   PT Start Time 0945   PT Stop Time 1034   PT Time Calculation (min) 49 min   Activity Tolerance Patient tolerated treatment well   Behavior During Therapy [facility] for tasks assessed/performed      Past Medical History  Diagnosis Date  . Arthritis   . Hypertension   . GERD (gastroesophageal reflux disease)   . Obesity     s/p gastric sleeve 12/2013 (previously weighed close to 400 lbs)  . Complication of anesthesia   . PONV (postoperative nausea and vomiting)   . Shortness of breath     WITH EXERTION - uses 02 3L nightly / 5 L  other times with exertion ,  resolved with bariatric surgery and weight loss  . Environmental allergies   . Incontinence of urine     at nite   . Difficulty sleeping     prior sleep study did not reveal sleep apnea per patient  . Paroxysmal atrial fibrillation (HCC)   . Left atrial dilatation   . DJD (degenerative joint disease)   . Heart murmur   . Supplemental oxygen dependent     uses 3l of oxygen at nite when lying flat   . Pneumonia     hx of   . Tuberculosis     had 6 month of INH due to exposure   . Hypothyroidism     Past Surgical History  Procedure Laterality Date  . Tonsillectomy    . Cholecystectomy    . Appendectomy    . Tubal ligation    . Transthoracic echocardiogram  11/2012    EF 60-65%, mod LVH & mod conc hypertrophy, grade 1 diastolic dysfunction; LA mildly dilated; RV systolic pressure increased; PA peak pressure 15mmHg  . Joint replacement  1999    L TOTAL KNEE  . Laparoscopic gastric sleeve  resection N/A 01/04/2014    Procedure: LAPAROSCOPIC GASTRIC SLEEVE RESECTION;  Surgeon: Greer Pickerel, MD;  Location: [facility];  Service: General;  Laterality: N/A;  . Upper gi endoscopy  01/04/2014    Procedure: UPPER GI ENDOSCOPY;  Surgeon: Greer Pickerel, MD;  Location: [facility];  Service: General;;  . Eye surgery      03/2014 lens implant left lens   . Total knee arthroplasty Right 05/17/2015    Procedure: RIGHT TOTAL KNEE ARTHROPLASTY;  Surgeon: Paralee Cancel, MD;  Location: [facility];  Service: Orthopedics;  Laterality: Right;    There were no vitals filed for this visit.  Visit Diagnosis:  Right knee pain  Swelling of right knee joint  Decreased range of motion of right knee  Debility      Subjective Assessment - 06/15/15 0959    Subjective I know I'm supposed to elevate my leg when I'm not up but I feel my knee is stiffening.  Plan on using a Cardio-Gloide I have at home.   Currently in Pain? Yes   Pain Score 2    Pain Location Knee   Pain Orientation Right   Pain Descriptors / Indicators Aching   Pain  Type Surgical pain   Pain Frequency Constant   Aggravating Factors  Rest and elevation.                         [facility] Adult PT Treatment/Exercise - 06/15/15 0001    Exercises   Exercises Knee/Hip   Knee/Hip Exercises: Aerobic   Stationary Bike Attempting backward revolutions over 15 minutes.   Vasopneumatic   Number Minutes Vasopneumatic  15 minutes   Vasopnuematic Location  --  Right knee.   Vasopneumatic Pressure Medium   Manual Therapy   Manual Therapy Passive ROM   Manual therapy comments PROM x 8 minutes to patient's right knee into flexion and extension                     PT Long Term Goals - 06/07/15 1235    PT LONG TERM GOAL #1   Title Ind with a HEP.   Time 4   Period Weeks   Status New   PT LONG TERM GOAL #2   Title Decrease edema to within 4 cms of contralateral side to assist with range of motion gains and decrease pain.    Time 4   Period Weeks   Status New   PT LONG TERM GOAL #3   Title Full active right knee extension in order to normalize gait   Time 4   Period Weeks   Status New   PT LONG TERM GOAL #4   Title Active knee flexion to 115 degrees+ so the patient can perform functional tasks and do so with pain not > 2-3/10.   Time 4   Period Weeks   Status New   PT LONG TERM GOAL #5   Title Increase right  knee strength to a solid 4+/5 to provide good stability for accomplishment of functional activities   Time 4   Period Weeks   Status New   Additional Long Term Goals   Additional Long Term Goals Yes   PT LONG TERM GOAL #6   Title Perform a reciprocating stair gait with one railing with pain not > 2-3/10.   Time 4   Period Weeks   Status New               Problem List Patient Active Problem List   Diagnosis Date Noted  . Obese 05/20/2015  . S/P right TKA 05/17/2015  . S/P knee replacement 05/17/2015  . Paroxysmal a-fib ([place]) 07/20/2014  . Prediabetes 01/06/2014  . Dyslipidemia 01/06/2014  . OA (osteoarthritis) 01/06/2014  . S/P laparoscopic sleeve gastrectomy 01/04/14 01/06/2014  . Postoperative atrial fibrillation - resolved 01/06/2014  . Morbid obesity ([place]) 01/04/2014  . Morbid obesity with BMI of 60.0-69.9, adult ([place]) 10/28/2013  . Dyspnea on exertion 09/05/2012  . Essential hypertension 04/21/2007  . [name] 04/21/2007    Dal Blew, [place] MPT 06/15/2015, 10:37 AM  [facility] [address] [place], [place], [address] Phone: [phone]   Fax:  [phone]  Name: Rebekah [name] MRN: [id number] Date of Birth: 1956/04/06

## 2015-06-16 ENCOUNTER — Ambulatory Visit: Payer: BC Managed Care – PPO | Admitting: Physical Therapy

## 2015-06-16 ENCOUNTER — Encounter: Payer: Self-pay | Admitting: Physical Therapy

## 2015-06-16 DIAGNOSIS — M25461 Effusion, right knee: Secondary | ICD-10-CM

## 2015-06-16 DIAGNOSIS — M25561 Pain in right knee: Secondary | ICD-10-CM | POA: Diagnosis not present

## 2015-06-16 DIAGNOSIS — M25661 Stiffness of right knee, not elsewhere classified: Secondary | ICD-10-CM

## 2015-06-16 DIAGNOSIS — R5381 Other malaise: Secondary | ICD-10-CM

## 2015-06-16 NOTE — Therapy (Signed)
[facility] [place], [place], [address] Phone: [phone]   Fax:  [phone]  Physical Therapy Treatment  Patient Details  Name: Rebekah [name] MRN: [id number] Date of Birth: 08/18/1955 Referring Provider: Paralee Cancel MD.  Encounter Date: 06/16/2015      PT End of Session - 06/16/15 0958    Visit Number 5   Number of Visits 12   Date for PT Re-Evaluation 07/12/15   PT Start Time 0950   PT Stop Time 1031   PT Time Calculation (min) 41 min   Activity Tolerance Patient tolerated treatment well   Behavior During Therapy [facility] for tasks assessed/performed      Past Medical History  Diagnosis Date  . Arthritis   . Hypertension   . GERD (gastroesophageal reflux disease)   . Obesity     s/p gastric sleeve 12/2013 (previously weighed close to 400 lbs)  . Complication of anesthesia   . PONV (postoperative nausea and vomiting)   . Shortness of breath     WITH EXERTION - uses 02 3L nightly / 5 L  other times with exertion ,  resolved with bariatric surgery and weight loss  . Environmental allergies   . Incontinence of urine     at nite   . Difficulty sleeping     prior sleep study did not reveal sleep apnea per patient  . Paroxysmal atrial fibrillation (HCC)   . Left atrial dilatation   . DJD (degenerative joint disease)   . Heart murmur   . Supplemental oxygen dependent     uses 3l of oxygen at nite when lying flat   . Pneumonia     hx of   . Tuberculosis     had 6 month of INH due to exposure   . Hypothyroidism     Past Surgical History  Procedure Laterality Date  . Tonsillectomy    . Cholecystectomy    . Appendectomy    . Tubal ligation    . Transthoracic echocardiogram  11/2012    EF 60-65%, mod LVH & mod conc hypertrophy, grade 1 diastolic dysfunction; LA mildly dilated; RV systolic pressure increased; PA peak pressure 10mmHg  . Joint replacement  1999    L TOTAL KNEE  . Laparoscopic gastric sleeve  resection N/A 01/04/2014    Procedure: LAPAROSCOPIC GASTRIC SLEEVE RESECTION;  Surgeon: Greer Pickerel, MD;  Location: [facility];  Service: General;  Laterality: N/A;  . Upper gi endoscopy  01/04/2014    Procedure: UPPER GI ENDOSCOPY;  Surgeon: Greer Pickerel, MD;  Location: [facility];  Service: General;;  . Eye surgery      03/2014 lens implant left lens   . Total knee arthroplasty Right 05/17/2015    Procedure: RIGHT TOTAL KNEE ARTHROPLASTY;  Surgeon: Paralee Cancel, MD;  Location: [facility];  Service: Orthopedics;  Laterality: Right;    There were no vitals filed for this visit.  Visit Diagnosis:  Right knee pain  Swelling of right knee joint  Decreased range of motion of right knee  Debility      Subjective Assessment - 06/16/15 0956    Subjective States that her B legs hurt today. Reports tightness in R HS.   Limitations Standing   Patient Stated Goals Get back to normal life.   Currently in Pain? Yes   Pain Score 4    Pain Location Knee   Pain Orientation Right;Distal;Medial   Pain Descriptors / Indicators Burning  Quarter size hotspot per patient  report   Pain Type Surgical pain            [facility] PT Assessment - 06/16/15 0001    Assessment   Medical Diagnosis Right total knee replacement.   Onset Date/Surgical Date 05/17/15   Next MD Visit 06/24/2015   Restrictions   Weight Bearing Restrictions No                     [facility] Adult PT Treatment/Exercise - 06/16/15 0001    Knee/Hip Exercises: Aerobic   Nustep L5 x18 min with seat advanced x2 to improve R knee ROM   Knee/Hip Exercises: Standing   Forward Step Up Right;2 sets;10 reps;Hand Hold: 2;Step Height: 6"   Rocker Board 4 minutes   Knee/Hip Exercises: Seated   Long Arc Quad Strengthening;Right;3 sets;10 reps;Weights   Long Arc Quad Weight 3 lbs.   Manual Therapy   Manual Therapy Myofascial release   Myofascial Release MFR to R distal HS to decrease tightness reported by patient                      PT Long Term Goals - 06/07/15 1235    PT LONG TERM GOAL #1   Title Ind with a HEP.   Time 4   Period Weeks   Status New   PT LONG TERM GOAL #2   Title Decrease edema to within 4 cms of contralateral side to assist with range of motion gains and decrease pain.   Time 4   Period Weeks   Status New   PT LONG TERM GOAL #3   Title Full active right knee extension in order to normalize gait   Time 4   Period Weeks   Status New   PT LONG TERM GOAL #4   Title Active knee flexion to 115 degrees+ so the patient can perform functional tasks and do so with pain not > 2-3/10.   Time 4   Period Weeks   Status New   PT LONG TERM GOAL #5   Title Increase right  knee strength to a solid 4+/5 to provide good stability for accomplishment of functional activities   Time 4   Period Weeks   Status New   Additional Long Term Goals   Additional Long Term Goals Yes   PT LONG TERM GOAL #6   Title Perform a reciprocating stair gait with one railing with pain not > 2-3/10.   Time 4   Period Weeks   Status New               Plan - 06/16/15 1324    Clinical Impression Statement Patient tolerated today's treatment well today although she reports R HS tightness and discomfort in B knees. Patient able to advance seat on NuStep as warm up progressed and to her tolerance. Patient tolerated new functional strengthening exercises fairly well such as forward step up and LAQ. Patient encounters difficulty with functional activities such as step ups due to the extra skin that is present around the medial aspects of both knees and the inflammation from the R TKR has caused the extra skin around R knee to swell. The area of extra skin around the R knee is more firm than that present around L knee and the extra skin comes in contact with each other during normal ambulation but also present with step up activities. Minimal R HS tightnes was present during manual therapy today in supine. Patient denied  modalities  as she feels it makes no significant difference in the inflammation of the R knee especially that that has settled in the extra skin of the R knee. Patient experienced 2-3/10 R knee pain following today's treatment.   Pt will benefit from skilled therapeutic intervention in order to improve on the following deficits Abnormal gait;Decreased activity tolerance;Pain;Decreased range of motion;Increased edema;Decreased strength   Rehab Potential Excellent   PT Frequency 3x / week   PT Duration 4 weeks   PT Treatment/Interventions ADLs/Self Care Home Management;Cryotherapy;Electrical Stimulation;Therapeutic exercise;Moist Heat;Therapeutic activities;Neuromuscular re-education;Patient/family education;Manual techniques;Vasopneumatic Device;Passive range of motion   PT Next Visit Plan Continue per TKR protocol and MPT POC as tolerated with modalites as needed for pain and inflammation.   Consulted and Agree with Plan of Care Patient        Problem List Patient Active Problem List   Diagnosis Date Noted  . Obese 05/20/2015  . S/P right TKA 05/17/2015  . S/P knee replacement 05/17/2015  . Paroxysmal a-fib ([place]) 07/20/2014  . Prediabetes 01/06/2014  . Dyslipidemia 01/06/2014  . OA (osteoarthritis) 01/06/2014  . S/P laparoscopic sleeve gastrectomy 01/04/14 01/06/2014  . Postoperative atrial fibrillation - resolved 01/06/2014  . Morbid obesity ([place]) 01/04/2014  . Morbid obesity with BMI of 60.0-69.9, adult ([place]) 10/28/2013  . Dyspnea on exertion 09/05/2012  . Essential hypertension 04/21/2007  . [name] 04/21/2007    Wynelle Fanny, PTA 06/16/2015, 1:33 PM  [facility] [address] [place], [place], [address] Phone: [phone]   Fax:  [phone]  Name: SHANTEZ WIKER MRN: [id number] Date of Birth: 08-19-1955

## 2015-06-17 ENCOUNTER — Encounter: Payer: Self-pay | Admitting: Internal Medicine

## 2015-06-17 ENCOUNTER — Ambulatory Visit (INDEPENDENT_AMBULATORY_CARE_PROVIDER_SITE_OTHER): Payer: BC Managed Care – PPO | Admitting: Internal Medicine

## 2015-06-17 VITALS — BP 122/74 | HR 60 | Ht 62.0 in | Wt 185.1 lb

## 2015-06-17 DIAGNOSIS — I480 Paroxysmal atrial fibrillation: Secondary | ICD-10-CM | POA: Diagnosis not present

## 2015-06-17 DIAGNOSIS — R0609 Other forms of dyspnea: Secondary | ICD-10-CM | POA: Diagnosis not present

## 2015-06-17 NOTE — Patient Instructions (Signed)
Your physician wants you to follow-up in: 6 months with Dr. Hilty. You will receive a reminder letter in the mail two months in advance. If you don't receive a letter, please call our office to schedule the follow-up appointment.    

## 2015-06-19 NOTE — Progress Notes (Signed)
OFFICE NOTE  Chief Complaint:  No complaints  Primary Care Physician: Terald Sleeper, PA-C  HPI:  Rebekah [name] is a pleasant 60 year old female with a past medical history significant for her super morbid obesity, chronic hypoxia, hypertension, GERD and prior knee replacement. She was previously followed by Dr. Rollene Fare and was being evaluated for bariatric surgery. She is here to establish new care with me today and for preoperative evaluation. 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 She appears he seen Dr. Rollene Fare will order an echocardiogram on August of 2014 which showed moderate thickening of the left ventricle with EF of 60-65%, mildly increased RV systolic pressure and no significant valvular disease. There was left atrial enlargement. She denies any chest pain but does have expected shortness of breath with exertion. Lower extremity venous Dopplers were performed which show no evidence of significant venous insufficiency.    Rebekah [name] returns today for follow-up. She seems to be doing remarkably well. She underwent gastric sleeve surgery and has had close to 60 pound weight loss. Unfortunately she was complicated by postoperative atrial fibrillation. She was seen by Dr. Martinique and placed on Cardizem. Subsequently she converted to sinus rhythm within 24 hours spontaneously. She's had no recurrence since that time and has not been additionally anticoagulated. She was not continued on Cardizem. She does have an elevated CHADSVASC score of 2.  I saw Rebekah [name] back in the office today. Please see her recent notes when I saw her in the emergency room at [facility]. She was noted to be in atrial fibrillation again and given flecainide to help convert her, but this was not successful. She is placed on diltiazem and in addition to metoprolol.  Rate slowed down and eventually she cardioverted on her own. We also started her on Xarelto which she's continues to take. Overall she's had very good rate control his heart rate is remained in the mid 40s to mid 60s. Blood pressure is somewhat labile and ranges between the low 90s up to the 140s. Overall she feels fairly well. She still has a few episodes of palpitations during the week when she feels that she is in A. fib although she feels like she is in A. fib today and her rhythm is sinus. She may not be sensitive to A. fib anymore. 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  I saw Rebekah [name] back in the office today. She's had remarkable weight loss. In fact her weight after sleeve gastrectomy is down 288 pounds. Her main issue now is excess tissue for which she's tried to get plastic surgery but has been denied multiple times. She reports no further palpitations or atrial fibrillation. She continues to take Xarelto without any bleeding complications. She continues to use nocturnal oxygen due to hypoxia, although her sleep study indicated no sleep apnea, her overnight oximetry study did indicate hypoxia with a low O2 sats ration of 86%. This was, of course prior to the recent 80-100 pound weight loss. She also reports recently she's been having epistaxis. In fact she had an episode that lasted for 10-12 hours, she used a box of 40 micro-tampons to try to get it to stop and then eventually tried to go to the emergency room. She is using nasal saline but feels that the oxygen at night is probably drying  her nose out.  Rebekah [name] was seen today for an urgent add-on. This morning she was awakened by her alarm clock around 5 AM and noticed that her heart was racing when she woke up. It did not slow down right away and she felt increasingly more fatigue. She said after taking a shower she felt dizzy and had some chest pressure. Her heart rate was very elevated and she tried to take it noting  it was "greater than 200". She decided then take 300 mg of flecainide. Subsequently she took 2 diltiazem's and after several hours he decided to come in for her appointment with the orthopedist. She says that on the way in to the office her heart rate slowed down and she felt better. When she saw her orthopedist they felt that she should be reevaluated by [name] here. Currently she is asymptomatic. EKG was performed showing sinus rhythm with sinus arrhythmia at 81.  Rebekah [name] returns today for follow-up. She had been reporting some worsening leg swelling however this seems to be improved somewhat today. She has had no recurrent paroxysmal arrhythmias. She continues to want to use the pocket pill strategy.  PMHx:  Past Medical History  Diagnosis Date  . Arthritis   . Hypertension   . GERD (gastroesophageal reflux disease)   . Obesity     s/p gastric sleeve 12/2013 (previously weighed close to 400 lbs)  . Complication of anesthesia   . PONV (postoperative nausea and vomiting)   . Shortness of breath     WITH EXERTION - uses 02 3L nightly / 5 L  other times with exertion ,  resolved with bariatric surgery and weight loss  . Environmental allergies   . Incontinence of urine     at nite   . Difficulty sleeping     prior sleep study did not reveal sleep apnea per patient  . Paroxysmal atrial fibrillation (HCC)   . Left atrial dilatation   . DJD (degenerative joint disease)   . Heart murmur   . Supplemental oxygen dependent     uses 3l of oxygen at nite when lying flat   . Pneumonia     hx of   . Tuberculosis     had 6 month of INH due to exposure   . Hypothyroidism     Past Surgical History  Procedure Laterality Date  . Tonsillectomy    . Cholecystectomy    . Appendectomy    . Tubal ligation    . Transthoracic echocardiogram  11/2012    EF 60-65%, mod LVH & mod conc hypertrophy, grade 1 diastolic dysfunction; LA mildly dilated; RV systolic pressure increased; PA peak pressure 52mmHg  .  Joint replacement  1999    L TOTAL KNEE  . Laparoscopic gastric sleeve resection N/A 01/04/2014    Procedure: LAPAROSCOPIC GASTRIC SLEEVE RESECTION;  Surgeon: Greer Pickerel, MD;  Location: [facility];  Service: General;  Laterality: N/A;  . Upper gi endoscopy  01/04/2014    Procedure: UPPER GI ENDOSCOPY;  Surgeon: Greer Pickerel, MD;  Location: [facility];  Service: General;;  . Eye surgery      03/2014 lens implant left lens   . Total knee arthroplasty Right 05/17/2015    Procedure: RIGHT TOTAL KNEE ARTHROPLASTY;  Surgeon: Paralee Cancel, MD;  Location: [facility];  Service: Orthopedics;  Laterality: Right;    FAMHx:  Family History  Problem Relation Age of Onset  . Diabetes Father   . Diabetes Maternal Grandmother   . Uterine  cancer Mother   . Cervical cancer Mother   . Breast cancer Mother   . Cancer Mother     cervical and brreast cancer    SOCHx:   reports that she has never smoked. She has never used smokeless tobacco. She reports that she does not drink alcohol or use illicit drugs.  ALLERGIES:  Allergies  Allergen Reactions  . Mucinex [Guaifenesin Er] Anaphylaxis  . Ace Inhibitors Swelling and Cough    Pedal Edema  . Codeine Nausea Only and Other (See Comments)    Stomach Ache, face itches  . Propoxyphene Hcl Other (See Comments)    REACTION: hallucinate- Darvon  . Tramadol Itching and Other (See Comments)    Severe abdominal pain- can stand when she eats.   Marland Kitchen Hydromet [Hydrocodone-Homatropine] Itching and Other (See Comments)    Severe stomach pain-face itches.   . Sulfonamide Derivatives Rash    ROS: Pertinent items noted in HPI and remainder of comprehensive ROS otherwise negative.  HOME MEDS: Current Outpatient Prescriptions  Medication Sig Dispense Refill  . acetaminophen (TYLENOL) 500 MG tablet Take 500-1,000 mg by mouth every 6 (six) hours as needed for moderate pain or headache.    Marland Kitchen BIOTIN PO Take 5,000 mcg by mouth daily.     Marland Kitchen CALCIUM PO Take 1,000 mg by mouth daily.     . cetirizine (ZYRTEC) 10 MG tablet Take 10 mg by mouth at bedtime.     . Cyanocobalamin (VITAMIN B 12 PO) Take 500 mcg by mouth daily.    Marland Kitchen diltiazem (CARDIZEM) 30 MG tablet Take 1-2 tablets by mouth every 6 hours as needed for fast heart rates 30 tablet 1  . diltiazem (DILACOR XR) 120 MG 24 hr capsule Take 1 capsule (120 mg total) by mouth daily. 90 capsule 3  . docusate sodium (COLACE) 100 MG capsule Take 1 capsule (100 mg total) by mouth 2 (two) times daily. (Patient taking differently: Take 100 mg by mouth as needed. ) 10 capsule 0  . doxycycline (DORYX) 100 MG EC tablet Take 100 mg by mouth 2 (two) times daily.    Marland Kitchen EPINEPHrine (EPIPEN) 0.3 mg/0.3 mL DEVI Inject 0.3 mg into the muscle once as needed (anaphylaxis).     Marland Kitchen escitalopram (LEXAPRO) 20 MG tablet Take 10 mg by mouth every morning.     . flecainide (TAMBOCOR) 100 MG tablet Take 3 tablets by mouth at onset of fast heart rates as needed 30 tablet 1  . furosemide (LASIX) 20 MG tablet Take 1 tablet (20 mg total) by mouth daily as needed for edema. 30 tablet 3  . HYDROmorphone (DILAUDID) 2 MG tablet Take 2-4 mg by mouth every 6 (six) hours as needed for severe pain.    Marland Kitchen levothyroxine (SYNTHROID, LEVOTHROID) 50 MCG tablet Take 50 mcg by mouth daily before breakfast.    . Multiple Vitamins-Minerals (MULTIVITAMIN ADULT PO) Take 1 capsule by mouth 2 (two) times daily. Journey 3+3 Bariatric Multivitamin    . OXYGEN Inhale 3 L/min into the lungs at bedtime. Continuously    . potassium chloride SA (K-DUR,KLOR-CON) 20 MEQ tablet Take 1 tablet (20 mEq total) by mouth daily as needed (take when taking lasix). 30 tablet 3  . Probiotic Product (PROBIOTIC DAILY PO) Take 2-4 Doses by mouth daily.    . rivaroxaban (XARELTO) 20 MG TABS tablet Take 1 tablet (20 mg total) by mouth daily with supper. 90 tablet 3   No current facility-administered medications for this visit.    LABS/IMAGING:  No results found for this or any previous visit (from the  past 48 hour(s)). No results found.  VITALS: BP 122/74 mmHg  Pulse 60  Ht 5\' 2"  (1.575 m)  Wt 185 lb 1.6 oz (83.961 kg)  BMI 33.85 kg/m2  EXAM: General appearance: alert, no distress and moderately obese Lungs: diminished breath sounds bilaterally Heart: regular rate and rhythm, S1, S2 normal, no murmur, click, rub or gallop Extremities: No ankle edema, excess tissue and firmness around the upper calf and knee areas Skin: Skin color, texture, turgor normal. No rashes or lesions Neurologic: Grossly normal  EKG: Deferred  ASSESSMENT: 1. Paroxysmal atrial fibrillation, recurrent- on pocket pill therapy 2. Bilateral leg edema - minimal 3. Morbid obesity with significant weight loss after bariatric surgery and excess tissue  PLAN: 1.    Rebekah [name] does not appear to have significant ankle or foot swelling today. She has significant excess tissue of her upper legs and knees from massive recent weight loss. She is not had recurrent A. fib since we last saw her and continues to want to use the pocket pill strategy. She's not had any problems with bleeding on Xarelto. We'll continue her current medications plan to see her back in 6 months.  Pixie Casino, MD, [facility] Attending Cardiologist [place] C Hilty 06/19/2015, 12:32 PM

## 2015-06-20 ENCOUNTER — Ambulatory Visit: Payer: BC Managed Care – PPO | Admitting: Physical Therapy

## 2015-06-20 ENCOUNTER — Encounter: Payer: Self-pay | Admitting: Physical Therapy

## 2015-06-20 DIAGNOSIS — R5381 Other malaise: Secondary | ICD-10-CM

## 2015-06-20 DIAGNOSIS — M25561 Pain in right knee: Secondary | ICD-10-CM | POA: Diagnosis not present

## 2015-06-20 DIAGNOSIS — M25661 Stiffness of right knee, not elsewhere classified: Secondary | ICD-10-CM

## 2015-06-20 DIAGNOSIS — M25461 Effusion, right knee: Secondary | ICD-10-CM

## 2015-06-20 NOTE — Therapy (Signed)
[facility] [place], [place], [address] Phone: [phone]   Fax:  [phone]  Physical Therapy Treatment  Patient Details  Name: Rebekah [name] MRN: [id number] Date of Birth: 1955/05/29 Referring Provider: Paralee Cancel MD.  Encounter Date: 06/20/2015      PT End of Session - 06/20/15 0949    Visit Number 6   Number of Visits 12   Date for PT Re-Evaluation 07/12/15   PT Start Time 0947   PT Stop Time 1037   PT Time Calculation (min) 50 min   Activity Tolerance Patient tolerated treatment well   Behavior During Therapy [place] for tasks assessed/performed      Past Medical History  Diagnosis Date  . Arthritis   . Hypertension   . GERD (gastroesophageal reflux disease)   . Obesity     s/p gastric sleeve 12/2013 (previously weighed close to 400 lbs)  . Complication of anesthesia   . PONV (postoperative nausea and vomiting)   . Shortness of breath     WITH EXERTION - uses 02 3L nightly / 5 L  other times with exertion ,  resolved with bariatric surgery and weight loss  . Environmental allergies   . Incontinence of urine     at nite   . Difficulty sleeping     prior sleep study did not reveal sleep apnea per patient  . Paroxysmal atrial fibrillation (HCC)   . Left atrial dilatation   . DJD (degenerative joint disease)   . Heart murmur   . Supplemental oxygen dependent     uses 3l of oxygen at nite when lying flat   . Pneumonia     hx of   . Tuberculosis     had 6 month of INH due to exposure   . Hypothyroidism     Past Surgical History  Procedure Laterality Date  . Tonsillectomy    . Cholecystectomy    . Appendectomy    . Tubal ligation    . Transthoracic echocardiogram  11/2012    EF 60-65%, mod LVH & mod conc hypertrophy, grade 1 diastolic dysfunction; LA mildly dilated; RV systolic pressure increased; PA peak pressure 29mmHg  . Joint replacement  1999    L TOTAL KNEE  . Laparoscopic gastric sleeve  resection N/A 01/04/2014    Procedure: LAPAROSCOPIC GASTRIC SLEEVE RESECTION;  Surgeon: Greer Pickerel, MD;  Location: [facility];  Service: General;  Laterality: N/A;  . Upper gi endoscopy  01/04/2014    Procedure: UPPER GI ENDOSCOPY;  Surgeon: Greer Pickerel, MD;  Location: [facility];  Service: General;;  . Eye surgery      03/2014 lens implant left lens   . Total knee arthroplasty Right 05/17/2015    Procedure: RIGHT TOTAL KNEE ARTHROPLASTY;  Surgeon: Paralee Cancel, MD;  Location: [facility];  Service: Orthopedics;  Laterality: Right;    There were no vitals filed for this visit.  Visit Diagnosis:  Right knee pain  Swelling of right knee joint  Decreased range of motion of right knee  Debility      Subjective Assessment - 06/20/15 0948    Subjective Reports that HS discomfort is not too bad today   Limitations Standing   Patient Stated Goals Get back to normal life.   Currently in Pain? Yes   Pain Score 2    Pain Location Knee   Pain Orientation Right   Pain Descriptors / Indicators Aching   Pain Type Surgical pain  [facility] PT Assessment - 06/20/15 0001    Assessment   Medical Diagnosis Right total knee replacement.   Onset Date/Surgical Date 05/17/15   Next MD Visit 06/24/2015   Restrictions   Weight Bearing Restrictions No   ROM / Strength   AROM / PROM / Strength AROM;PROM   AROM   Overall AROM  Deficits   AROM Assessment Site Knee   Right/Left Knee Right   Right Knee Flexion 94   PROM   Overall PROM  Deficits   PROM Assessment Site Knee   Right/Left Knee Right   Right Knee Flexion 98                     [facility] Adult PT Treatment/Exercise - 06/20/15 0001    Ambulation/Gait   Ambulation/Gait Yes   Ambulation/Gait Assistance 6: Modified independent (Device/Increase time)   Ambulation Distance (Feet) 80 Feet   Assistive device None   Gait Pattern Step-through pattern;Decreased hip/knee flexion - right;Antalgic;Lateral trunk lean to left   Ambulation  Surface Level;Indoor   Stairs Yes   Stair Management Technique One rail Right;Step to pattern;Forwards   Number of Stairs 12   Height of Stairs 6   Door Management 6: Modified independent (Device/Increase time)   Knee/Hip Exercises: Aerobic   Nustep L6-8 x12 min with seat advanced x2 for R knee ROM   Knee/Hip Exercises: Standing   Hip Abduction Stengthening;Right;2 sets;10 reps   Forward Step Up Right;3 sets;10 reps;Hand Hold: 2;Step Height: 6"   Functional Squat 2 sets;10 reps   Knee/Hip Exercises: Seated   Long Arc Quad Strengthening;Right;3 sets;10 reps;Weights   Long Arc Quad Weight 3 lbs.   Manual Therapy   Manual Therapy Myofascial release;Passive ROM;Soft tissue mobilization   Soft tissue mobilization R knee incision mobilizations and patella mobilizations in L/R, sup/inf, tilits to prevent adhesions and promote proper mobility   Myofascial Release MFR to R distal HS to decrease tightness reported by patient   Passive ROM PROM of R knee into flexion with gentle holds at end range in supine                     PT Long Term Goals - 06/07/15 1235    PT LONG TERM GOAL #1   Title Ind with a HEP.   Time 4   Period Weeks   Status New   PT LONG TERM GOAL #2   Title Decrease edema to within 4 cms of contralateral side to assist with range of motion gains and decrease pain.   Time 4   Period Weeks   Status New   PT LONG TERM GOAL #3   Title Full active right knee extension in order to normalize gait   Time 4   Period Weeks   Status New   PT LONG TERM GOAL #4   Title Active knee flexion to 115 degrees+ so the patient can perform functional tasks and do so with pain not > 2-3/10.   Time 4   Period Weeks   Status New   PT LONG TERM GOAL #5   Title Increase right  knee strength to a solid 4+/5 to provide good stability for accomplishment of functional activities   Time 4   Period Weeks   Status New   Additional Long Term Goals   Additional Long Term Goals Yes    PT LONG TERM GOAL #6   Title Perform a reciprocating stair gait with one railing with pain not >  2-3/10.   Time 4   Period Weeks   Status New               Plan - 06/20/15 1111    Clinical Impression Statement Patient tolerated today's treatment well with no reports of any increased pain with exercises. Patient able to advance seat and resistance on NuStep as tolerated by her discretion. Patient completed all exercises well with good technique although with squats patient could feel L knee discomfort. Patient ambulated well without AD with only gait deviation being lack of full R knee flexion and also the contact of extra skin with leg swing. Patient presented with decreased inflammation and tightness in posterior R knee today and also has good R knee incison and patella mobility. Patient demonstrated 94 deg AROM flexion and PROM measured as 98 deg today in supine.    Pt will benefit from skilled therapeutic intervention in order to improve on the following deficits Abnormal gait;Decreased activity tolerance;Pain;Decreased range of motion;Increased edema;Decreased strength   Rehab Potential Excellent   PT Frequency 3x / week   PT Duration 4 weeks   PT Treatment/Interventions ADLs/Self Care Home Management;Cryotherapy;Electrical Stimulation;Therapeutic exercise;Moist Heat;Therapeutic activities;Neuromuscular re-education;Patient/family education;Manual techniques;Vasopneumatic Device;Passive range of motion   PT Next Visit Plan Continue per TKR protocol and MPT POC as tolerated with modalites as needed for pain and inflammation.   Consulted and Agree with Plan of Care Patient        Problem List Patient Active Problem List   Diagnosis Date Noted  . Obese 05/20/2015  . S/P right TKA 05/17/2015  . S/P knee replacement 05/17/2015  . Paroxysmal a-fib ([place]) 07/20/2014  . Prediabetes 01/06/2014  . Dyslipidemia 01/06/2014  . OA (osteoarthritis) 01/06/2014  . S/P laparoscopic sleeve  gastrectomy 01/04/14 01/06/2014  . Postoperative atrial fibrillation - resolved 01/06/2014  . Morbid obesity ([place]) 01/04/2014  . Morbid obesity with BMI of 60.0-69.9, adult ([place]) 10/28/2013  . Dyspnea on exertion 09/05/2012  . Essential hypertension 04/21/2007  . [name] 04/21/2007    Wynelle Fanny, PTA 06/20/2015, 11:17 AM  [facility] [address] [place], [place], [address] Phone: [phone]   Fax:  [phone]  Name: LENDA MORTENSEN MRN: [id number] Date of Birth: 1955-11-25

## 2015-06-22 ENCOUNTER — Ambulatory Visit: Payer: BC Managed Care – PPO | Admitting: *Deleted

## 2015-06-22 DIAGNOSIS — M25561 Pain in right knee: Secondary | ICD-10-CM

## 2015-06-22 DIAGNOSIS — R5381 Other malaise: Secondary | ICD-10-CM

## 2015-06-22 DIAGNOSIS — M25461 Effusion, right knee: Secondary | ICD-10-CM

## 2015-06-22 DIAGNOSIS — M25661 Stiffness of right knee, not elsewhere classified: Secondary | ICD-10-CM

## 2015-06-22 NOTE — Therapy (Signed)
[facility] [place], [place], [address] Phone: [phone]   Fax:  [phone]  Physical Therapy Treatment  Patient Details  Name: Rebekah [name] MRN: [id number] Date of Birth: 08-Aug-1955 Referring Provider: Paralee Cancel MD.  Encounter Date: 06/22/2015      PT End of Session - 06/22/15 0955    Visit Number 7   Number of Visits 12   Date for PT Re-Evaluation 07/12/15   PT Start Time 0945   PT Stop Time [id number]   PT Time Calculation (min) 59 min      Past Medical History  Diagnosis Date  . Arthritis   . Hypertension   . GERD (gastroesophageal reflux disease)   . Obesity     s/p gastric sleeve 12/2013 (previously weighed close to 400 lbs)  . Complication of anesthesia   . PONV (postoperative nausea and vomiting)   . Shortness of breath     WITH EXERTION - uses 02 3L nightly / 5 L  other times with exertion ,  resolved with bariatric surgery and weight loss  . Environmental allergies   . Incontinence of urine     at nite   . Difficulty sleeping     prior sleep study did not reveal sleep apnea per patient  . Paroxysmal atrial fibrillation (HCC)   . Left atrial dilatation   . DJD (degenerative joint disease)   . Heart murmur   . Supplemental oxygen dependent     uses 3l of oxygen at nite when lying flat   . Pneumonia     hx of   . Tuberculosis     had 6 month of INH due to exposure   . Hypothyroidism     Past Surgical History  Procedure Laterality Date  . Tonsillectomy    . Cholecystectomy    . Appendectomy    . Tubal ligation    . Transthoracic echocardiogram  11/2012    EF 60-65%, mod LVH & mod conc hypertrophy, grade 1 diastolic dysfunction; LA mildly dilated; RV systolic pressure increased; PA peak pressure 67mmHg  . Joint replacement  1999    L TOTAL KNEE  . Laparoscopic gastric sleeve resection N/A 01/04/2014    Procedure: LAPAROSCOPIC GASTRIC SLEEVE RESECTION;  Surgeon: Greer Pickerel, MD;  Location: [facility];  Service: General;  Laterality: N/A;  . Upper gi endoscopy  01/04/2014    Procedure: UPPER GI ENDOSCOPY;  Surgeon: Greer Pickerel, MD;  Location: [facility];  Service: General;;  . Eye surgery      03/2014 lens implant left lens   . Total knee arthroplasty Right 05/17/2015    Procedure: RIGHT TOTAL KNEE ARTHROPLASTY;  Surgeon: Paralee Cancel, MD;  Location: [facility];  Service: Orthopedics;  Laterality: Right;    There were no vitals filed for this visit.  Visit Diagnosis:  Right knee pain  Swelling of right knee joint  Decreased range of motion of right knee  Debility      Subjective Assessment - 06/22/15 0951    Subjective Reports that HS discomfort is not too bad today. RT knee continues to swell   Limitations Standing   Patient Stated Goals Get back to normal life.   Currently in Pain? Yes   Pain Score 2    Pain Location Knee   Pain Orientation Right   Pain Descriptors / Indicators Aching   Pain Type Surgical pain   Pain Frequency Constant   Pain Relieving Factors Rest and elevation  [place] Adult PT Treatment/Exercise - 06/22/15 0001    Knee/Hip Exercises: Aerobic   Nustep L6-8 x15 min with seat advanced 10,9,8,7,6 for R knee ROM   Knee/Hip Exercises: Standing   Knee Flexion AROM;Right;10 reps   Forward Step Up Right;3 sets;10 reps;Hand Hold: 2;Step Height: 6"   Rocker Board 4 minutes  calf stretching   Modalities   Modalities Vasopneumatic   Vasopneumatic   Number Minutes Vasopneumatic  15 minutes   Vasopnuematic Location  Knee   Vasopneumatic Pressure Medium   Vasopneumatic Temperature  38   Manual Therapy   Manual Therapy Myofascial release;Passive ROM;Soft tissue mobilization   Passive ROM PROM of R knee into flexion with gentle holds at end range in supine                     PT Long Term Goals - 06/07/15 1235    PT LONG TERM GOAL #1   Title Ind with a HEP.   Time 4   Period Weeks   Status New   PT LONG  TERM GOAL #2   Title Decrease edema to within 4 cms of contralateral side to assist with range of motion gains and decrease pain.   Time 4   Period Weeks   Status New   PT LONG TERM GOAL #3   Title Full active right knee extension in order to normalize gait   Time 4   Period Weeks   Status New   PT LONG TERM GOAL #4   Title Active knee flexion to 115 degrees+ so the patient can perform functional tasks and do so with pain not > 2-3/10.   Time 4   Period Weeks   Status New   PT LONG TERM GOAL #5   Title Increase right  knee strength to a solid 4+/5 to provide good stability for accomplishment of functional activities   Time 4   Period Weeks   Status New   Additional Long Term Goals   Additional Long Term Goals Yes   PT LONG TERM GOAL #6   Title Perform a reciprocating stair gait with one railing with pain not > 2-3/10.   Time 4   Period Weeks   Status New               Plan - 06/22/15 [id number]    Clinical Impression Statement Pt  did great today and was able to move seat closer on nustep to increase ROM and had 95 degrees today. She did well with other Therex and feels that her strength is improving. Her greatest concern is scar tissue and swelling preventing her flexion ROM.   Pt will benefit from skilled therapeutic intervention in order to improve on the following deficits Abnormal gait;Decreased activity tolerance;Pain;Decreased range of motion;Increased edema;Decreased strength   Rehab Potential Excellent   PT Frequency 3x / week   PT Duration 4 weeks   PT Treatment/Interventions ADLs/Self Care Home Management;Cryotherapy;Electrical Stimulation;Therapeutic exercise;Moist Heat;Therapeutic activities;Neuromuscular re-education;Patient/family education;Manual techniques;Vasopneumatic Device;Passive range of motion   PT Next Visit Plan Continue per TKR protocol and MPT POC as tolerated with modalites as needed for pain and inflammation.  TO MD FRIDAY   Consulted and Agree  with Plan of Care Patient        Problem List Patient Active Problem List   Diagnosis Date Noted  . Obese 05/20/2015  . S/P right TKA 05/17/2015  . S/P knee replacement 05/17/2015  . Paroxysmal a-fib ([place]) 07/20/2014  . Prediabetes 01/06/2014  .  Dyslipidemia 01/06/2014  . OA (osteoarthritis) 01/06/2014  . S/P laparoscopic sleeve gastrectomy 01/04/14 01/06/2014  . Postoperative atrial fibrillation - resolved 01/06/2014  . Morbid obesity ([place]) 01/04/2014  . Morbid obesity with BMI of 60.0-69.9, adult ([place]) 10/28/2013  . Dyspnea on exertion 09/05/2012  . Essential hypertension 04/21/2007  . G E R D 04/21/2007    RAMSEUR,CHRIS, PTA 06/22/2015, 11:16 AM  [facility] [place], [place], [address] Phone: [phone]   Fax:  [phone]  Name: Rebekah [name] MRN: [id number] Date of Birth: 10-Jul-1955

## 2015-06-24 ENCOUNTER — Ambulatory Visit: Payer: BC Managed Care – PPO | Admitting: Physical Therapy

## 2015-06-24 DIAGNOSIS — M25561 Pain in right knee: Secondary | ICD-10-CM | POA: Diagnosis not present

## 2015-06-24 DIAGNOSIS — M25461 Effusion, right knee: Secondary | ICD-10-CM

## 2015-06-24 DIAGNOSIS — M25661 Stiffness of right knee, not elsewhere classified: Secondary | ICD-10-CM

## 2015-06-24 NOTE — Therapy (Signed)
[facility] [place], [place], [address] Phone: [phone]   Fax:  [phone]  Physical Therapy Treatment  Patient Details  Name: Rebekah [name] MRN: [id number] Date of Birth: 20-Mar-1956 Referring Provider: Paralee Cancel MD.  Encounter Date: 06/24/2015      PT End of Session - 06/24/15 0919    Visit Number 8   Number of Visits 12   Date for PT Re-Evaluation 07/12/15   PT Start Time 0900   Activity Tolerance Patient tolerated treatment well   Behavior During Therapy [facility] for tasks assessed/performed      Past Medical History  Diagnosis Date  . Arthritis   . Hypertension   . GERD (gastroesophageal reflux disease)   . Obesity     s/p gastric sleeve 12/2013 (previously weighed close to 400 lbs)  . Complication of anesthesia   . PONV (postoperative nausea and vomiting)   . Shortness of breath     WITH EXERTION - uses 02 3L nightly / 5 L  other times with exertion ,  resolved with bariatric surgery and weight loss  . Environmental allergies   . Incontinence of urine     at nite   . Difficulty sleeping     prior sleep study did not reveal sleep apnea per patient  . Paroxysmal atrial fibrillation (HCC)   . Left atrial dilatation   . DJD (degenerative joint disease)   . Heart murmur   . Supplemental oxygen dependent     uses 3l of oxygen at nite when lying flat   . Pneumonia     hx of   . Tuberculosis     had 6 month of INH due to exposure   . Hypothyroidism     Past Surgical History  Procedure Laterality Date  . Tonsillectomy    . Cholecystectomy    . Appendectomy    . Tubal ligation    . Transthoracic echocardiogram  11/2012    EF 60-65%, mod LVH & mod conc hypertrophy, grade 1 diastolic dysfunction; LA mildly dilated; RV systolic pressure increased; PA peak pressure 64mmHg  . Joint replacement  1999    L TOTAL KNEE  . Laparoscopic gastric sleeve resection N/A 01/04/2014    Procedure: LAPAROSCOPIC GASTRIC  SLEEVE RESECTION;  Surgeon: Greer Pickerel, MD;  Location: [facility];  Service: General;  Laterality: N/A;  . Upper gi endoscopy  01/04/2014    Procedure: UPPER GI ENDOSCOPY;  Surgeon: Greer Pickerel, MD;  Location: [facility];  Service: General;;  . Eye surgery      03/2014 lens implant left lens   . Total knee arthroplasty Right 05/17/2015    Procedure: RIGHT TOTAL KNEE ARTHROPLASTY;  Surgeon: Paralee Cancel, MD;  Location: [facility];  Service: Orthopedics;  Laterality: Right;    There were no vitals filed for this visit.  Visit Diagnosis:  Right knee pain  Swelling of right knee joint  Decreased range of motion of right knee      Subjective Assessment - 06/24/15 0920    Subjective My pain-level is a 2/10 today.   Limitations Standing   Patient Stated Goals Get back to normal life.   Pain Score 2    Pain Location Knee   Pain Orientation Right   Pain Descriptors / Indicators Aching   Pain Type Surgical pain   Pain Frequency Constant            [facility] PT Assessment - 06/24/15 0001    PROM  Right/Left Knee --  Right knee.   Right Knee Flexion --  100 degrees.                     [place] Adult PT Treatment/Exercise - 06/24/15 0001    Knee/Hip Exercises: Aerobic   Nustep Level 6 to 8 moving forward to increase flexion to seat 5 over 5 minutes.   Vasopneumatic   Number Minutes Vasopneumatic  15 minutes   Vasopnuematic Location  --  Right knee.   Vasopneumatic Pressure Medium   Manual Therapy   Manual therapy comments PROM into right knee flexion x 3 minutes.   Ankle Exercises: Standing   Rocker Board Limitations 6 minutes.                     PT Long Term Goals - 06/24/15 0956    PT LONG TERM GOAL #1   Title Ind with a HEP.   Time 4   Period Weeks   Status Achieved   PT LONG TERM GOAL #4   Status --  Passive 100 degrees.               Plan - 06/24/15 0955    Clinical Impression Statement My pain is low but my knee keeps swelling.   Pt  will benefit from skilled therapeutic intervention in order to improve on the following deficits Abnormal gait;Decreased activity tolerance;Pain;Decreased range of motion;Increased edema;Decreased strength        Problem List Patient Active Problem List   Diagnosis Date Noted  . Obese 05/20/2015  . S/P right TKA 05/17/2015  . S/P knee replacement 05/17/2015  . Paroxysmal a-fib ([place]) 07/20/2014  . Prediabetes 01/06/2014  . Dyslipidemia 01/06/2014  . OA (osteoarthritis) 01/06/2014  . S/P laparoscopic sleeve gastrectomy 01/04/14 01/06/2014  . Postoperative atrial fibrillation - resolved 01/06/2014  . Morbid obesity ([place]) 01/04/2014  . Morbid obesity with BMI of 60.0-69.9, adult ([place]) 10/28/2013  . Dyspnea on exertion 09/05/2012  . Essential hypertension 04/21/2007  . G E R D 04/21/2007   Plan to continue and push ROM. Duston Smolenski, [place] MPT 06/24/2015, 9:59 AM  [facility] [address] [place], [place], [address] Phone: [phone]   Fax:  [phone]  Name: JENNAVIE SALYARDS MRN: [id number] Date of Birth: 1955-08-24

## 2015-06-27 ENCOUNTER — Ambulatory Visit: Payer: BC Managed Care – PPO | Admitting: Physical Therapy

## 2015-06-27 ENCOUNTER — Encounter: Payer: Self-pay | Admitting: Physical Therapy

## 2015-06-27 DIAGNOSIS — R5381 Other malaise: Secondary | ICD-10-CM

## 2015-06-27 DIAGNOSIS — M25561 Pain in right knee: Secondary | ICD-10-CM

## 2015-06-27 DIAGNOSIS — M25461 Effusion, right knee: Secondary | ICD-10-CM

## 2015-06-27 DIAGNOSIS — M25661 Stiffness of right knee, not elsewhere classified: Secondary | ICD-10-CM

## 2015-06-27 NOTE — Patient Instructions (Signed)
Stretching: Hamstring (Sitting)    With right leg straight, tuck other foot near groin. Reach down until stretch is felt in back of thigh. Keep back straight. Hold __30__ seconds. Repeat _3___ times per set. Do __1__ sets per session. Do __2-3__ sessions per day.  http://orth.exer.us/660   Copyright  VHI. All rights reserved.

## 2015-06-27 NOTE — Therapy (Signed)
[facility] [place], [place], [address] Phone: [phone]   Fax:  [phone]  Physical Therapy Treatment  Patient Details  Name: Rebekah [name] MRN: [id number] Date of Birth: February 01, 1956 Referring Provider: Paralee Cancel MD.  Encounter Date: 06/27/2015      PT End of Session - 06/27/15 0951    Visit Number 9   Number of Visits 12   Date for PT Re-Evaluation 07/12/15   PT Start Time 0949   PT Stop Time 1030   PT Time Calculation (min) 41 min   Activity Tolerance Patient tolerated treatment well   Behavior During Therapy [facility] for tasks assessed/performed      Past Medical History  Diagnosis Date  . Arthritis   . Hypertension   . GERD (gastroesophageal reflux disease)   . Obesity     s/p gastric sleeve 12/2013 (previously weighed close to 400 lbs)  . Complication of anesthesia   . PONV (postoperative nausea and vomiting)   . Shortness of breath     WITH EXERTION - uses 02 3L nightly / 5 L  other times with exertion ,  resolved with bariatric surgery and weight loss  . Environmental allergies   . Incontinence of urine     at nite   . Difficulty sleeping     prior sleep study did not reveal sleep apnea per patient  . Paroxysmal atrial fibrillation (HCC)   . Left atrial dilatation   . DJD (degenerative joint disease)   . Heart murmur   . Supplemental oxygen dependent     uses 3l of oxygen at nite when lying flat   . Pneumonia     hx of   . Tuberculosis     had 6 month of INH due to exposure   . Hypothyroidism     Past Surgical History  Procedure Laterality Date  . Tonsillectomy    . Cholecystectomy    . Appendectomy    . Tubal ligation    . Transthoracic echocardiogram  11/2012    EF 60-65%, mod LVH & mod conc hypertrophy, grade 1 diastolic dysfunction; LA mildly dilated; RV systolic pressure increased; PA peak pressure 38mmHg  . Joint replacement  1999    L TOTAL KNEE  . Laparoscopic gastric sleeve  resection N/A 01/04/2014    Procedure: LAPAROSCOPIC GASTRIC SLEEVE RESECTION;  Surgeon: Greer Pickerel, MD;  Location: [facility];  Service: General;  Laterality: N/A;  . Upper gi endoscopy  01/04/2014    Procedure: UPPER GI ENDOSCOPY;  Surgeon: Greer Pickerel, MD;  Location: [facility];  Service: General;;  . Eye surgery      03/2014 lens implant left lens   . Total knee arthroplasty Right 05/17/2015    Procedure: RIGHT TOTAL KNEE ARTHROPLASTY;  Surgeon: Paralee Cancel, MD;  Location: [facility];  Service: Orthopedics;  Laterality: Right;    There were no vitals filed for this visit.  Visit Diagnosis:  Right knee pain  Swelling of right knee joint  Decreased range of motion of right knee  Debility      Subjective Assessment - 06/27/15 0950    Subjective States MD was pleased and is understanding of obstacles she is encountering and is now allowed to drive.   Limitations Standing   Patient Stated Goals Get back to normal life.   Currently in Pain? Yes   Pain Score 2    Pain Location Knee   Pain Orientation Right   Pain Type Surgical pain  [facility] PT Assessment - 06/27/15 0001    Assessment   Medical Diagnosis Right total knee replacement.   Onset Date/Surgical Date 05/17/15   Next MD Visit 08/05/2015   Restrictions   Weight Bearing Restrictions No                     [facility] Adult PT Treatment/Exercise - 06/27/15 0001    Knee/Hip Exercises: Aerobic   Nustep L6, seat 7-5 x12 min for L knee fleixon   Knee/Hip Exercises: Standing   Lateral Step Up Right;2 sets;10 reps;Hand Hold: 2;Step Height: 6"   Forward Step Up Right;3 sets;10 reps;Hand Hold: 2;Step Height: 6"   Rocker Board 3 minutes   Knee/Hip Exercises: Seated   Long Arc Quad Strengthening;Right;3 sets;10 reps;Weights   Long Arc Quad Weight 3 lbs.   Manual Therapy   Manual Therapy Passive ROM   Passive ROM PROM of R knee into flexion/ext with gentle holds at end range in supine                PT  Education - 06/27/15 1036    Education provided Yes   Education Details HEP- HS stretch   Person(s) Educated Patient   Methods Explanation;Verbal cues;Handout   Comprehension Verbalized understanding;Verbal cues required             PT Long Term Goals - 06/24/15 0956    PT LONG TERM GOAL #1   Title Ind with a HEP.   Time 4   Period Weeks   Status Achieved   PT LONG TERM GOAL #4   Status --  Passive 100 degrees.               Plan - 06/27/15 1036    Clinical Impression Statement Patient tolerated today's treatment well with only reports of discomfort with PROM of R knee into flexion. R hip circumduction noted with initial reps of forward step ups but patient able to correct without VCs from PTA. Completed all exercises well otherwise without compensation noted and without complaint of pain. The extra skin was visibily observed as in contact with forward step ups. PROM of R knee was tolerated fairly well and firm end feels noted with both flexion and extension. Accepted HS stretch in sitting HEP to improve knee extension and to decrease HS tightness and discomfort.   Pt will benefit from skilled therapeutic intervention in order to improve on the following deficits Abnormal gait;Decreased activity tolerance;Pain;Decreased range of motion;Increased edema;Decreased strength   Rehab Potential Excellent   PT Frequency 3x / week   PT Duration 4 weeks   PT Treatment/Interventions ADLs/Self Care Home Management;Cryotherapy;Electrical Stimulation;Therapeutic exercise;Moist Heat;Therapeutic activities;Neuromuscular re-education;Patient/family education;Manual techniques;Vasopneumatic Device;Passive range of motion   PT Next Visit Plan Continue per TKR protocol and MPT POC as tolerated with modalites as needed for pain and inflammation.    PT Home Exercise Plan HEP- HS stretch   Consulted and Agree with Plan of Care Patient        Problem List Patient Active Problem List    Diagnosis Date Noted  . Obese 05/20/2015  . S/P right TKA 05/17/2015  . S/P knee replacement 05/17/2015  . Paroxysmal a-fib ([place]) 07/20/2014  . Prediabetes 01/06/2014  . Dyslipidemia 01/06/2014  . OA (osteoarthritis) 01/06/2014  . S/P laparoscopic sleeve gastrectomy 01/04/14 01/06/2014  . Postoperative atrial fibrillation - resolved 01/06/2014  . Morbid obesity ([place]) 01/04/2014  . Morbid obesity with BMI of 60.0-69.9, adult ([place]) 10/28/2013  . Dyspnea on exertion  09/05/2012  . Essential hypertension 04/21/2007  . [name] 04/21/2007    Wynelle Fanny, PTA 06/27/2015, 10:46 AM  [facility] [address] [place], [place], [address] Phone: [phone]   Fax:  [phone]  Name: Rebekah [name] MRN: [id number] Date of Birth: 1955-05-28

## 2015-06-30 ENCOUNTER — Ambulatory Visit: Payer: BC Managed Care – PPO | Admitting: Physical Therapy

## 2015-06-30 ENCOUNTER — Encounter: Payer: Self-pay | Admitting: Physical Therapy

## 2015-06-30 DIAGNOSIS — M25461 Effusion, right knee: Secondary | ICD-10-CM

## 2015-06-30 DIAGNOSIS — M25661 Stiffness of right knee, not elsewhere classified: Secondary | ICD-10-CM

## 2015-06-30 DIAGNOSIS — M25561 Pain in right knee: Secondary | ICD-10-CM | POA: Diagnosis not present

## 2015-06-30 DIAGNOSIS — R5381 Other malaise: Secondary | ICD-10-CM

## 2015-06-30 NOTE — Therapy (Signed)
[name] [place], [place], [address] Phone: [phone]   Fax:  [phone]  Physical Therapy Treatment  Patient Details  Name: Rebekah [name] MRN: [id number] Date of Birth: 08/23/1955 Referring Provider: Paralee Cancel MD.  Encounter Date: 06/30/2015      PT End of Session - 06/30/15 0949    Visit Number 10   Number of Visits 12   Date for PT Re-Evaluation 07/12/15   PT Start Time 0952   PT Stop Time 1034   PT Time Calculation (min) 42 min   Activity Tolerance Patient tolerated treatment well   Behavior During Therapy Rebekah [name] for tasks assessed/performed      Past Medical History  Diagnosis Date  . Arthritis   . Hypertension   . GERD (gastroesophageal reflux disease)   . Obesity     s/p gastric sleeve 12/2013 (previously weighed close to 400 lbs)  . Complication of anesthesia   . PONV (postoperative nausea and vomiting)   . Shortness of breath     WITH EXERTION - uses 02 3L nightly / 5 L  other times with exertion ,  resolved with bariatric surgery and weight loss  . Environmental allergies   . Incontinence of urine     at nite   . Difficulty sleeping     prior sleep study did not reveal sleep apnea per patient  . Paroxysmal atrial fibrillation (HCC)   . Left atrial dilatation   . DJD (degenerative joint disease)   . Heart murmur   . Supplemental oxygen dependent     uses 3l of oxygen at nite when lying flat   . Pneumonia     hx of   . Tuberculosis     had 6 month of INH due to exposure   . Hypothyroidism     Past Surgical History  Procedure Laterality Date  . Tonsillectomy    . Cholecystectomy    . Appendectomy    . Tubal ligation    . Transthoracic echocardiogram  11/2012    EF 60-65%, mod LVH & mod conc hypertrophy, grade 1 diastolic dysfunction; LA mildly dilated; RV systolic pressure increased; PA peak pressure 13mmHg  . Joint replacement  1999    L TOTAL KNEE  . Laparoscopic gastric sleeve  resection N/A 01/04/2014    Procedure: LAPAROSCOPIC GASTRIC SLEEVE RESECTION;  Surgeon: Greer Pickerel, MD;  Location: [facility];  Service: General;  Laterality: N/A;  . Upper gi endoscopy  01/04/2014    Procedure: UPPER GI ENDOSCOPY;  Surgeon: Greer Pickerel, MD;  Location: [facility];  Service: General;;  . Eye surgery      03/2014 lens implant left lens   . Total knee arthroplasty Right 05/17/2015    Procedure: RIGHT TOTAL KNEE ARTHROPLASTY;  Surgeon: Paralee Cancel, MD;  Location: [facility];  Service: Orthopedics;  Laterality: Right;    There were no vitals filed for this visit.  Visit Diagnosis:  Right knee pain  Swelling of right knee joint  Decreased range of motion of right knee  Debility      Subjective Assessment - 06/30/15 0959    Subjective States that her knee is a little sore after being busy the last few days. States that she had A Fib symptoms this morning but are now gone. Patient refused vitals monitoring in clinic. States that she has not taken any pain medication in several days.   Limitations Standing   Patient Stated Goals Get back to normal life.  Currently in Pain? Yes   Pain Score 2    Pain Location Knee   Pain Orientation Right   Pain Descriptors / Indicators Sore   Pain Type Surgical pain            [facility] PT Assessment - 06/30/15 0001    Assessment   Medical Diagnosis Right total knee replacement.   Onset Date/Surgical Date 05/17/15   Next MD Visit 08/05/2015   Restrictions   Weight Bearing Restrictions No                     [facility] Adult PT Treatment/Exercise - 06/30/15 0001    Knee/Hip Exercises: Aerobic   Nustep L6, seat 8- 5, x10 min for L knee flexion   Knee/Hip Exercises: Standing   Forward Lunges Right;2 sets;10 reps;3 seconds  12" step   Lateral Step Up Right;3 sets;10 reps;Hand Hold: 2;Step Height: 6"   Forward Step Up Right;3 sets;10 reps;Hand Hold: 2;Step Height: 6"   Rocker Board 3 minutes   Knee/Hip Exercises: Seated   Long Arc  Quad Strengthening;Right;2 sets;10 reps;Weights   Long Arc Quad Weight 4 lbs.   Manual Therapy   Manual Therapy Passive ROM   Passive ROM PROM of R knee into flexion with gentle holds at end range in sitting                     PT Long Term Goals - 06/30/15 [id number]    PT LONG TERM GOAL #1   Title Ind with a HEP.   Time 4   Period Weeks   Status Achieved   PT LONG TERM GOAL #2   Title Decrease edema to within 4 cms of contralateral side to assist with range of motion gains and decrease pain.   Time 4   Period Weeks   Status On-going   PT LONG TERM GOAL #3   Title Full active right knee extension in order to normalize gait   Time 4   Period Weeks   Status On-going   PT LONG TERM GOAL #4   Title Active knee flexion to 115 degrees+ so the patient can perform functional tasks and do so with pain not > 2-3/10.   Time 4   Period Weeks   Status On-going  Passive 100 degrees.   PT LONG TERM GOAL #5   Title Increase right  knee strength to a solid 4+/5 to provide good stability for accomplishment of functional activities   Time 4   Period Weeks   Status On-going   PT LONG TERM GOAL #6   Title Perform a reciprocating stair gait with one railing with pain not > 2-3/10.   Time 4   Period Weeks   Status On-going               Plan - 06/30/15 1300    Clinical Impression Statement Patient tolerated today's treatment well although she arrived with increased soreness. Completed all exercises well today with no complaints of increased pain or soreness. Contact with the extra skin of B lower legs continues to limit patient with step up activiites. Patient completed step ups with minimal to very little hip hiking to advance RLE. Patient was able to advance seat position on NuStep per her tolerance to increase R knee flexion. PROM of R knee into flexion in sitting was tolerated without complaint of increased pain or soreness. Patient experienced 4-5/10 R knee soreness following  today's treatment. Patient did not  verbalize any A Fib symptoms aloud to PT staff during today's treatment.   Pt will benefit from skilled therapeutic intervention in order to improve on the following deficits Abnormal gait;Decreased activity tolerance;Pain;Decreased range of motion;Increased edema;Decreased strength   Rehab Potential Excellent   PT Frequency 3x / week   PT Duration 4 weeks   PT Treatment/Interventions ADLs/Self Care Home Management;Cryotherapy;Electrical Stimulation;Therapeutic exercise;Moist Heat;Therapeutic activities;Neuromuscular re-education;Patient/family education;Manual techniques;Vasopneumatic Device;Passive range of motion   PT Next Visit Plan Continue per TKR protocol and MPT POC as tolerated with modalites as needed for pain and inflammation.    PT Home Exercise Plan HEP- HS stretch   Consulted and Agree with Plan of Care Patient        Problem List Patient Active Problem List   Diagnosis Date Noted  . Obese 05/20/2015  . S/P right TKA 05/17/2015  . S/P knee replacement 05/17/2015  . Paroxysmal a-fib ([place]) 07/20/2014  . Prediabetes 01/06/2014  . Dyslipidemia 01/06/2014  . OA (osteoarthritis) 01/06/2014  . S/P laparoscopic sleeve gastrectomy 01/04/14 01/06/2014  . Postoperative atrial fibrillation - resolved 01/06/2014  . Morbid obesity ([place]) 01/04/2014  . Morbid obesity with BMI of 60.0-69.9, adult ([place]) 10/28/2013  . Dyspnea on exertion 09/05/2012  . Essential hypertension 04/21/2007  . [name] 04/21/2007    Wynelle Fanny, PTA 06/30/2015, 4:20 PM  [name] [address] [place], [place], [address] Phone: [phone]   Fax:  [phone]  Name: Rebekah [name] MRN: [id number] Date of Birth: Apr 15, 1956

## 2015-07-04 ENCOUNTER — Ambulatory Visit: Payer: BC Managed Care – PPO | Admitting: Physical Therapy

## 2015-07-04 ENCOUNTER — Encounter: Payer: Self-pay | Admitting: Physical Therapy

## 2015-07-04 DIAGNOSIS — M25561 Pain in right knee: Secondary | ICD-10-CM

## 2015-07-04 DIAGNOSIS — M25461 Effusion, right knee: Secondary | ICD-10-CM

## 2015-07-04 DIAGNOSIS — M25661 Stiffness of right knee, not elsewhere classified: Secondary | ICD-10-CM

## 2015-07-04 DIAGNOSIS — R5381 Other malaise: Secondary | ICD-10-CM

## 2015-07-04 NOTE — Therapy (Signed)
[facility] [place], [place], [address] Phone: [phone]   Fax:  [phone]  Physical Therapy Treatment  Patient Details  Name: Rebekah [name] MRN: [id number] Date of Birth: 1955/10/08 Referring Provider: Paralee Cancel MD.  Encounter Date: 07/04/2015      PT End of Session - 07/04/15 0943    Visit Number 11   Number of Visits 12   Date for PT Re-Evaluation 07/12/15   PT Start Time 0948   PT Stop Time 1035   PT Time Calculation (min) 47 min   Activity Tolerance Patient tolerated treatment well   Behavior During Therapy [facility] for tasks assessed/performed      Past Medical History  Diagnosis Date  . Arthritis   . Hypertension   . GERD (gastroesophageal reflux disease)   . Obesity     s/p gastric sleeve 12/2013 (previously weighed close to 400 lbs)  . Complication of anesthesia   . PONV (postoperative nausea and vomiting)   . Shortness of breath     WITH EXERTION - uses 02 3L nightly / 5 L  other times with exertion ,  resolved with bariatric surgery and weight loss  . Environmental allergies   . Incontinence of urine     at nite   . Difficulty sleeping     prior sleep study did not reveal sleep apnea per patient  . Paroxysmal atrial fibrillation (HCC)   . Left atrial dilatation   . DJD (degenerative joint disease)   . Heart murmur   . Supplemental oxygen dependent     uses 3l of oxygen at nite when lying flat   . Pneumonia     hx of   . Tuberculosis     had 6 month of INH due to exposure   . Hypothyroidism     Past Surgical History  Procedure Laterality Date  . Tonsillectomy    . Cholecystectomy    . Appendectomy    . Tubal ligation    . Transthoracic echocardiogram  11/2012    EF 60-65%, mod LVH & mod conc hypertrophy, grade 1 diastolic dysfunction; LA mildly dilated; RV systolic pressure increased; PA peak pressure 60mmHg  . Joint replacement  1999    L TOTAL KNEE  . Laparoscopic gastric sleeve  resection N/A 01/04/2014    Procedure: LAPAROSCOPIC GASTRIC SLEEVE RESECTION;  Surgeon: Greer Pickerel, MD;  Location: [facility];  Service: General;  Laterality: N/A;  . Upper gi endoscopy  01/04/2014    Procedure: UPPER GI ENDOSCOPY;  Surgeon: Greer Pickerel, MD;  Location: [facility];  Service: General;;  . Eye surgery      03/2014 lens implant left lens   . Total knee arthroplasty Right 05/17/2015    Procedure: RIGHT TOTAL KNEE ARTHROPLASTY;  Surgeon: Paralee Cancel, MD;  Location: [facility];  Service: Orthopedics;  Laterality: Right;    There were no vitals filed for this visit.  Visit Diagnosis:  Right knee pain  Swelling of right knee joint  Decreased range of motion of right knee  Debility      Subjective Assessment - 07/04/15 1010    Subjective Reports she was in bed all day yesterday due to A- Fib symptoms. States that she continues elevating and icing at home for inflammation. States that she still has difficulty with stairs to which she attributes to weakness.   Limitations Standing   Patient Stated Goals Get back to normal life.   Currently in Pain? Yes   Pain  Score 1    Pain Location Knee   Pain Orientation Right   Pain Descriptors / Indicators Sore   Pain Type Surgical pain            [facility] PT Assessment - 07/04/15 0001    Assessment   Medical Diagnosis Right total knee replacement.   Onset Date/Surgical Date 05/17/15   Next MD Visit 08/05/2015   Restrictions   Weight Bearing Restrictions No   Observation/Other Assessments-Edema    Edema Circumferential   ROM / Strength   AROM / PROM / Strength AROM;PROM   AROM   Overall AROM  Deficits   AROM Assessment Site Knee   Right/Left Knee Right   Right Knee Extension -3   Right Knee Flexion 100   PROM   Overall PROM  --   PROM Assessment Site --   Right/Left Knee --                     [facility] Adult PT Treatment/Exercise - 07/04/15 0001    Knee/Hip Exercises: Aerobic   Nustep L6, seat 8-5, x12 min for L knee  flexion   Knee/Hip Exercises: Standing   Forward Lunges Right;2 sets;10 reps;3 seconds  10" step   Lateral Step Up Right;3 sets;10 reps;Hand Hold: 2;Step Height: 6"   Forward Step Up Right;3 sets;10 reps;Hand Hold: 2;Step Height: 6"   Knee/Hip Exercises: Seated   Long Arc Quad Strengthening;Right;3 sets;10 reps;Weights   Long Arc Quad Weight 4 lbs.   Manual Therapy   Manual Therapy Passive ROM   Passive ROM PROM of R knee into flexion with gentle holds at end range in sitting                     PT Long Term Goals - 06/30/15 [id number]    PT LONG TERM GOAL #1   Title Ind with a HEP.   Time 4   Period Weeks   Status Achieved   PT LONG TERM GOAL #2   Title Decrease edema to within 4 cms of contralateral side to assist with range of motion gains and decrease pain.   Time 4   Period Weeks   Status On-going   PT LONG TERM GOAL #3   Title Full active right knee extension in order to normalize gait   Time 4   Period Weeks   Status On-going   PT LONG TERM GOAL #4   Title Active knee flexion to 115 degrees+ so the patient can perform functional tasks and do so with pain not > 2-3/10.   Time 4   Period Weeks   Status On-going  Passive 100 degrees.   PT LONG TERM GOAL #5   Title Increase right  knee strength to a solid 4+/5 to provide good stability for accomplishment of functional activities   Time 4   Period Weeks   Status On-going   PT LONG TERM GOAL #6   Title Perform a reciprocating stair gait with one railing with pain not > 2-3/10.   Time 4   Period Weeks   Status On-going               Plan - 07/04/15 1043    Clinical Impression Statement Patient tolerated today's treatment well with only reports of R medioinferior knee "hotspot" as patient calls it. Completed all exercises well although weakness continues to be noted forward and lateral step ups. Patient noted that she could tell R knee was swelling  during exercises as extra skin between knees were  coming in contact more often. AROM of R knee measured as 3-100 deg today. Patient experenced R knee "hotspot" sensitivity with PROM of R knee into flexion and sitting. Patient experienced 2-3/10 R knee "hotspot" soreness upon end of treatment.   Pt will benefit from skilled therapeutic intervention in order to improve on the following deficits Abnormal gait;Decreased activity tolerance;Pain;Decreased range of motion;Increased edema;Decreased strength   Rehab Potential Excellent   PT Frequency 3x / week   PT Duration 4 weeks   PT Treatment/Interventions ADLs/Self Care Home Management;Cryotherapy;Electrical Stimulation;Therapeutic exercise;Moist Heat;Therapeutic activities;Neuromuscular re-education;Patient/family education;Manual techniques;Vasopneumatic Device;Passive range of motion   PT Next Visit Plan Continue per TKR protocol and MPT POC as tolerated with modalites as needed for pain and inflammation.    PT Home Exercise Plan HEP- HS stretch   Consulted and Agree with Plan of Care Patient        Problem List Patient Active Problem List   Diagnosis Date Noted  . Obese 05/20/2015  . S/P right TKA 05/17/2015  . S/P knee replacement 05/17/2015  . Paroxysmal a-fib ([place]) 07/20/2014  . Prediabetes 01/06/2014  . Dyslipidemia 01/06/2014  . OA (osteoarthritis) 01/06/2014  . S/P laparoscopic sleeve gastrectomy 01/04/14 01/06/2014  . Postoperative atrial fibrillation - resolved 01/06/2014  . Morbid obesity ([place]) 01/04/2014  . Morbid obesity with BMI of 60.0-69.9, adult ([place]) 10/28/2013  . Dyspnea on exertion 09/05/2012  . Essential hypertension 04/21/2007  . [name] 04/21/2007    Wynelle Fanny, PTA 07/04/2015, 10:48 AM  [facility] [address] [place], [place], [address] Phone: [phone]   Fax:  [phone]  Name: Rebekah [name] MRN: [id number] Date of Birth: 1955/09/13

## 2015-07-06 ENCOUNTER — Ambulatory Visit: Payer: BC Managed Care – PPO | Admitting: Physical Therapy

## 2015-07-06 ENCOUNTER — Encounter: Payer: Self-pay | Admitting: Physical Therapy

## 2015-07-06 DIAGNOSIS — M25561 Pain in right knee: Secondary | ICD-10-CM | POA: Diagnosis not present

## 2015-07-06 DIAGNOSIS — M25461 Effusion, right knee: Secondary | ICD-10-CM

## 2015-07-06 DIAGNOSIS — M25661 Stiffness of right knee, not elsewhere classified: Secondary | ICD-10-CM

## 2015-07-06 DIAGNOSIS — R5381 Other malaise: Secondary | ICD-10-CM

## 2015-07-06 NOTE — Therapy (Addendum)
[facility] [place], [place], [address] Phone: [phone]   Fax:  [phone]  Physical Therapy Treatment  Patient Details  Name: Rebekah [name] MRN: [id number] Date of Birth: 09/17/1955 Referring Provider: Paralee Cancel MD.  Encounter Date: 07/06/2015      PT End of Session - 07/06/15 0946    Visit Number 12   Number of Visits 12   Date for PT Re-Evaluation 07/12/15   PT Start Time 0949   PT Stop Time 1031   PT Time Calculation (min) 42 min   Activity Tolerance Patient tolerated treatment well   Behavior During Therapy [facility] for tasks assessed/performed      Past Medical History  Diagnosis Date  . Arthritis   . Hypertension   . GERD (gastroesophageal reflux disease)   . Obesity     s/p gastric sleeve 12/2013 (previously weighed close to 400 lbs)  . Complication of anesthesia   . PONV (postoperative nausea and vomiting)   . Shortness of breath     WITH EXERTION - uses 02 3L nightly / 5 L  other times with exertion ,  resolved with bariatric surgery and weight loss  . Environmental allergies   . Incontinence of urine     at nite   . Difficulty sleeping     prior sleep study did not reveal sleep apnea per patient  . Paroxysmal atrial fibrillation (HCC)   . Left atrial dilatation   . DJD (degenerative joint disease)   . Heart murmur   . Supplemental oxygen dependent     uses 3l of oxygen at nite when lying flat   . Pneumonia     hx of   . Tuberculosis     had 6 month of INH due to exposure   . Hypothyroidism     Past Surgical History  Procedure Laterality Date  . Tonsillectomy    . Cholecystectomy    . Appendectomy    . Tubal ligation    . Transthoracic echocardiogram  11/2012    EF 60-65%, mod LVH & mod conc hypertrophy, grade 1 diastolic dysfunction; LA mildly dilated; RV systolic pressure increased; PA peak pressure 71mmHg  . Joint replacement  1999    L TOTAL KNEE  . Laparoscopic gastric sleeve  resection N/A 01/04/2014    Procedure: LAPAROSCOPIC GASTRIC SLEEVE RESECTION;  Surgeon: Greer Pickerel, MD;  Location: [facility];  Service: General;  Laterality: N/A;  . Upper gi endoscopy  01/04/2014    Procedure: UPPER GI ENDOSCOPY;  Surgeon: Greer Pickerel, MD;  Location: [facility];  Service: General;;  . Eye surgery      03/2014 lens implant left lens   . Total knee arthroplasty Right 05/17/2015    Procedure: RIGHT TOTAL KNEE ARTHROPLASTY;  Surgeon: Paralee Cancel, MD;  Location: [facility];  Service: Orthopedics;  Laterality: Right;    There were no vitals filed for this visit.  Visit Diagnosis:  Right knee pain  Swelling of right knee joint  Decreased range of motion of right knee  Debility      Subjective Assessment - 07/06/15 0946    Subjective States that she felt like she could straighten knee out better in standing and feels like it is equal to LLE.   Limitations Standing   Patient Stated Goals Get back to normal life.            [facility] PT Assessment - 07/06/15 0001    Assessment   Medical Diagnosis Right  total knee replacement.   Onset Date/Surgical Date 05/17/15   Next MD Visit 08/05/2015   Restrictions   Weight Bearing Restrictions No   ROM / Strength   AROM / PROM / Strength AROM   AROM   Overall AROM  Deficits   AROM Assessment Site Knee   Right/Left Knee Right   Right Knee Extension -8  in standing per patient request   Right Knee Flexion 100  in sitting                     [facility] Adult PT Treatment/Exercise - 07/06/15 0001    Knee/Hip Exercises: Aerobic   Nustep L6, seat 8-4, x12 min for L knee flexion   Knee/Hip Exercises: Standing   Lateral Step Up Right;3 sets;10 reps;Hand Hold: 2;Step Height: 6"   Forward Step Up Right;4 sets;10 reps;Hand Hold: 2;Step Height: 6"   Manual Therapy   Manual Therapy Passive ROM   Passive ROM PROM of R knee into flexion with gentle holds at end range in sitting                     PT Long Term Goals -  06/30/15 [id number]    PT LONG TERM GOAL #1   Title Ind with a HEP.   Time 4   Period Weeks   Status Achieved   PT LONG TERM GOAL #2   Title Decrease edema to within 4 cms of contralateral side to assist with range of motion gains and decrease pain.   Time 4   Period Weeks   Status On-going   PT LONG TERM GOAL #3   Title Full active right knee extension in order to normalize gait   Time 4   Period Weeks   Status On-going   PT LONG TERM GOAL #4   Title Active knee flexion to 115 degrees+ so the patient can perform functional tasks and do so with pain not > 2-3/10.   Time 4   Period Weeks   Status On-going  Passive 100 degrees.   PT LONG TERM GOAL #5   Title Increase right  knee strength to a solid 4+/5 to provide good stability for accomplishment of functional activities   Time 4   Period Weeks   Status On-going   PT LONG TERM GOAL #6   Title Perform a reciprocating stair gait with one railing with pain not > 2-3/10.   Time 4   Period Weeks   Status On-going               Plan - 07/06/15 1036    Clinical Impression Statement Patient tolerated today's treatment well and requested R knee extension measured in standing as she feels she is able to extend it more and is now equal to the LLE. In standing R knee extension was measured as -8 deg from neutral and LLE was measured as 0 deg. Patient's R knee flexion was measured as 100 deg in sitting with VC to avoid hip hiking. Patient able to tolerate advancing NuStep seat to 4 today to further improve R knee flexion with reports of hotspot aggravation. Hotspot aggravation in R knee was noted again PROM of R knee into flexion in sitting.    Pt will benefit from skilled therapeutic intervention in order to improve on the following deficits Abnormal gait;Decreased activity tolerance;Pain;Decreased range of motion;Increased edema;Decreased strength   Rehab Potential Excellent   PT Frequency 3x / week   PT Duration  4 weeks   PT  Treatment/Interventions ADLs/Self Care Home Management;Cryotherapy;Electrical Stimulation;Therapeutic exercise;Moist Heat;Therapeutic activities;Neuromuscular re-education;Patient/family education;Manual techniques;Vasopneumatic Device;Passive range of motion   PT Next Visit Plan Continue per TKR protocol and MPT POC as tolerated with modalites as needed for pain and inflammation.    PT Home Exercise Plan HEP- HS stretch   Consulted and Agree with Plan of Care Patient        Problem List Patient Active Problem List   Diagnosis Date Noted  . Obese 05/20/2015  . S/P right TKA 05/17/2015  . S/P knee replacement 05/17/2015  . Paroxysmal a-fib ([place]) 07/20/2014  . Prediabetes 01/06/2014  . Dyslipidemia 01/06/2014  . OA (osteoarthritis) 01/06/2014  . S/P laparoscopic sleeve gastrectomy 01/04/14 01/06/2014  . Postoperative atrial fibrillation - resolved 01/06/2014  . Morbid obesity ([place]) 01/04/2014  . Morbid obesity with BMI of 60.0-69.9, adult ([place]) 10/28/2013  . Dyspnea on exertion 09/05/2012  . Essential hypertension 04/21/2007  . [name] 04/21/2007    Wynelle Fanny, PTA 07/06/2015, 10:42 AM  [facility] [address] [place], [place], [address] Phone: [phone]   Fax:  [phone]  Name: Rebekah [name] MRN: [id number] Date of Birth: 1955-10-17

## 2015-07-07 ENCOUNTER — Encounter: Payer: BC Managed Care – PPO | Admitting: Physical Therapy

## 2015-07-11 ENCOUNTER — Encounter: Payer: Self-pay | Admitting: Physical Therapy

## 2015-07-11 ENCOUNTER — Ambulatory Visit: Payer: BC Managed Care – PPO | Admitting: Physical Therapy

## 2015-07-11 DIAGNOSIS — R5381 Other malaise: Secondary | ICD-10-CM

## 2015-07-11 DIAGNOSIS — M25661 Stiffness of right knee, not elsewhere classified: Secondary | ICD-10-CM

## 2015-07-11 DIAGNOSIS — M25561 Pain in right knee: Secondary | ICD-10-CM | POA: Diagnosis not present

## 2015-07-11 DIAGNOSIS — M25461 Effusion, right knee: Secondary | ICD-10-CM

## 2015-07-11 NOTE — Therapy (Signed)
[facility] [place], [place], [address] Phone: [phone]   Fax:  [phone]  Physical Therapy Treatment  Patient Details  Name: Rebekah [name] MRN: [id number] Date of Birth: 07/22/1955 Referring Provider: Paralee Cancel MD.  Encounter Date: 07/11/2015      PT End of Session - 07/11/15 0951    Visit Number 13   Number of Visits 20   Date for PT Re-Evaluation 07/22/15   PT Start Time 0947   PT Stop Time 1032   PT Time Calculation (min) 45 min   Activity Tolerance Patient tolerated treatment well   Behavior During Therapy [facility] for tasks assessed/performed      Past Medical History  Diagnosis Date  . Arthritis   . Hypertension   . GERD (gastroesophageal reflux disease)   . Obesity     s/p gastric sleeve 12/2013 (previously weighed close to 400 lbs)  . Complication of anesthesia   . PONV (postoperative nausea and vomiting)   . Shortness of breath     WITH EXERTION - uses 02 3L nightly / 5 L  other times with exertion ,  resolved with bariatric surgery and weight loss  . Environmental allergies   . Incontinence of urine     at nite   . Difficulty sleeping     prior sleep study did not reveal sleep apnea per patient  . Paroxysmal atrial fibrillation (HCC)   . Left atrial dilatation   . DJD (degenerative joint disease)   . Heart murmur   . Supplemental oxygen dependent     uses 3l of oxygen at nite when lying flat   . Pneumonia     hx of   . Tuberculosis     had 6 month of INH due to exposure   . Hypothyroidism     Past Surgical History  Procedure Laterality Date  . Tonsillectomy    . Cholecystectomy    . Appendectomy    . Tubal ligation    . Transthoracic echocardiogram  11/2012    EF 60-65%, mod LVH & mod conc hypertrophy, grade 1 diastolic dysfunction; LA mildly dilated; RV systolic pressure increased; PA peak pressure 58mmHg  . Joint replacement  1999    L TOTAL KNEE  . Laparoscopic gastric sleeve  resection N/A 01/04/2014    Procedure: LAPAROSCOPIC GASTRIC SLEEVE RESECTION;  Surgeon: Greer Pickerel, MD;  Location: [facility];  Service: General;  Laterality: N/A;  . Upper gi endoscopy  01/04/2014    Procedure: UPPER GI ENDOSCOPY;  Surgeon: Greer Pickerel, MD;  Location: [facility];  Service: General;;  . Eye surgery      03/2014 lens implant left lens   . Total knee arthroplasty Right 05/17/2015    Procedure: RIGHT TOTAL KNEE ARTHROPLASTY;  Surgeon: Paralee Cancel, MD;  Location: [facility];  Service: Orthopedics;  Laterality: Right;    There were no vitals filed for this visit.  Visit Diagnosis:  Right knee pain  Swelling of right knee joint  Decreased range of motion of right knee  Debility      Subjective Assessment - 07/11/15 0950    Subjective States that she has very low pain this morning but went to both [employer] and [employer] yesterday and had pain after walking around both places. States that she took a pain medication and muscle relaxant.   Limitations Standing   Patient Stated Goals Get back to normal life.   Currently in Pain? Yes   Pain Score 1  Pain Location Knee   Pain Orientation Right   Pain Type Surgical pain            [facility] PT Assessment - 07/11/15 0001    Assessment   Medical Diagnosis Right total knee replacement.   Onset Date/Surgical Date 05/17/15   Next MD Visit 08/05/2015   Restrictions   Weight Bearing Restrictions No                     [facility] Adult PT Treatment/Exercise - 07/11/15 0001    Knee/Hip Exercises: Aerobic   Nustep L6, seat 7-4, x15 min for L knee flexion   Knee/Hip Exercises: Standing   Lateral Step Up Right;3 sets;10 reps;Hand Hold: 2;Step Height: 6"   Forward Step Up Right;3 sets;10 reps;Hand Hold: 2;Step Height: 6"   Knee/Hip Exercises: Seated   Long Arc Quad Strengthening;Right;3 sets;10 reps;Weights   Long Arc Quad Weight 4 lbs.   Manual Therapy   Manual Therapy Passive ROM   Passive ROM PROM of R knee into flexion/  extension with gentle holds at end range in sitting                     PT Long Term Goals - 06/30/15 0958    PT LONG TERM GOAL #1   Title Ind with a HEP.   Time 4   Period Weeks   Status Achieved   PT LONG TERM GOAL #2   Title Decrease edema to within 4 cms of contralateral side to assist with range of motion gains and decrease pain.   Time 4   Period Weeks   Status On-going   PT LONG TERM GOAL #3   Title Full active right knee extension in order to normalize gait   Time 4   Period Weeks   Status On-going   PT LONG TERM GOAL #4   Title Active knee flexion to 115 degrees+ so the patient can perform functional tasks and do so with pain not > 2-3/10.   Time 4   Period Weeks   Status On-going  Passive 100 degrees.   PT LONG TERM GOAL #5   Title Increase right  knee strength to a solid 4+/5 to provide good stability for accomplishment of functional activities   Time 4   Period Weeks   Status On-going   PT LONG TERM GOAL #6   Title Perform a reciprocating stair gait with one railing with pain not > 2-3/10.   Time 4   Period Weeks   Status On-going               Plan - 07/11/15 1113    Clinical Impression Statement Patient tolerated today's treatment well although she was fatigued from previous day of prolonged ambulation. Patient was able to complete exercises well through the fatigue. Patient was able to tolerate advancing to seat four on NuStep again. Patient tolerated PROM of R knee well although ROM into flexion may not have been as good as it has previously due to fatigue.    Pt will benefit from skilled therapeutic intervention in order to improve on the following deficits Abnormal gait;Decreased activity tolerance;Pain;Decreased range of motion;Increased edema;Decreased strength   Rehab Potential Excellent   PT Frequency 3x / week   PT Duration 4 weeks   PT Treatment/Interventions ADLs/Self Care Home Management;Cryotherapy;Electrical  Stimulation;Therapeutic exercise;Moist Heat;Therapeutic activities;Neuromuscular re-education;Patient/family education;Manual techniques;Vasopneumatic Device;Passive range of motion   PT Next Visit Plan Continue per TKR protocol and MPT  POC as tolerated with modalites as needed for pain and inflammation.    PT Home Exercise Plan HEP- HS stretch   Consulted and Agree with Plan of Care Patient        Problem List Patient Active Problem List   Diagnosis Date Noted  . Obese 05/20/2015  . S/P right TKA 05/17/2015  . S/P knee replacement 05/17/2015  . Paroxysmal a-fib ([place]) 07/20/2014  . Prediabetes 01/06/2014  . Dyslipidemia 01/06/2014  . OA (osteoarthritis) 01/06/2014  . S/P laparoscopic sleeve gastrectomy 01/04/14 01/06/2014  . Postoperative atrial fibrillation - resolved 01/06/2014  . Morbid obesity ([place]) 01/04/2014  . Morbid obesity with BMI of 60.0-69.9, adult ([place]) 10/28/2013  . Dyspnea on exertion 09/05/2012  . Essential hypertension 04/21/2007  . [name] 04/21/2007    Wynelle Fanny, PTA 07/11/2015, 11:17 AM  [facility] [address] [place], [place], [address] Phone: [phone]   Fax:  [phone]  Name: Rebekah [name] MRN: [id number] Date of Birth: 06/24/1955

## 2015-07-13 ENCOUNTER — Ambulatory Visit: Payer: BC Managed Care – PPO | Admitting: Physical Therapy

## 2015-07-13 ENCOUNTER — Encounter: Payer: Self-pay | Admitting: Physical Therapy

## 2015-07-13 DIAGNOSIS — R5381 Other malaise: Secondary | ICD-10-CM

## 2015-07-13 DIAGNOSIS — M25561 Pain in right knee: Secondary | ICD-10-CM | POA: Diagnosis not present

## 2015-07-13 DIAGNOSIS — M25661 Stiffness of right knee, not elsewhere classified: Secondary | ICD-10-CM

## 2015-07-13 DIAGNOSIS — M25461 Effusion, right knee: Secondary | ICD-10-CM

## 2015-07-13 NOTE — Therapy (Signed)
[facility] [place], [place], [address] Phone: [phone]   Fax:  [phone]  Physical Therapy Treatment  Patient Details  Name: RHIANNAH DALSANTO MRN: [id number] Date of Birth: [birth year] Referring Provider: Paralee Cancel MD.  Encounter Date: 07/13/2015      PT End of Session - 07/13/15 0947    Visit Number 14   Number of Visits 20   Date for PT Re-Evaluation 07/22/15   PT Start Time 0950   PT Stop Time 1031   PT Time Calculation (min) 41 min   Activity Tolerance Patient tolerated treatment well   Behavior During Therapy [facility] for tasks assessed/performed      Past Medical History  Diagnosis Date  . Arthritis   . Hypertension   . GERD (gastroesophageal reflux disease)   . Obesity     s/p gastric sleeve 12/2013 (previously weighed close to 400 lbs)  . Complication of anesthesia   . PONV (postoperative nausea and vomiting)   . Shortness of breath     WITH EXERTION - uses 02 3L nightly / 5 L  other times with exertion ,  resolved with bariatric surgery and weight loss  . Environmental allergies   . Incontinence of urine     at nite   . Difficulty sleeping     prior sleep study did not reveal sleep apnea per patient  . Paroxysmal atrial fibrillation (HCC)   . Left atrial dilatation   . DJD (degenerative joint disease)   . Heart murmur   . Supplemental oxygen dependent     uses 3l of oxygen at nite when lying flat   . Pneumonia     hx of   . Tuberculosis     had 6 month of INH due to exposure   . Hypothyroidism     Past Surgical History  Procedure Laterality Date  . Tonsillectomy    . Cholecystectomy    . Appendectomy    . Tubal ligation    . Transthoracic echocardiogram  11/2012    EF 60-65%, mod LVH & mod conc hypertrophy, grade 1 diastolic dysfunction; LA mildly dilated; RV systolic pressure increased; PA peak pressure 53mmHg  . Joint replacement  1999    L TOTAL KNEE  . Laparoscopic gastric sleeve  resection N/A 01/04/2014    Procedure: LAPAROSCOPIC GASTRIC SLEEVE RESECTION;  Surgeon: Greer Pickerel, MD;  [facility]: [facility];  Service: General;  Laterality: N/A;  . Upper gi endoscopy  01/04/2014    Procedure: UPPER GI ENDOSCOPY;  Surgeon: Greer Pickerel, MD;  [facility]: [facility];  Service: General;;  . Eye surgery      03/2014 lens implant left lens   . Total knee arthroplasty Right 05/17/2015    Procedure: RIGHT TOTAL KNEE ARTHROPLASTY;  Surgeon: Paralee Cancel, MD;  Location: [facility];  Service: Orthopedics;  Laterality: Right;    There were no vitals filed for this visit.  Visit Diagnosis:  Right knee pain  Swelling of right knee joint  Decreased range of motion of right knee  Debility      Subjective Assessment - 07/13/15 1026    Subjective States she has some soreness from playing pickeball yesterday. States that while she is out of town she will continue to exercise as she can and will look into local gym.   Limitations Standing   Patient Stated Goals Get back to normal life.   Currently in Pain? Yes   Pain Score 1    Pain Location  Knee   Pain Orientation Right   Pain Descriptors / Indicators Sore   Pain Type Surgical pain            [facility] PT Assessment - 07/13/15 0001    Assessment   Medical Diagnosis Right total knee replacement.   Onset Date/Surgical Date 05/17/15   Next MD Visit 08/05/2015   Restrictions   Weight Bearing Restrictions No                     [facility] Adult PT Treatment/Exercise - 07/13/15 0001    Knee/Hip Exercises: Aerobic   Nustep L6, seat 7-4, x15 min for L knee flexion   Knee/Hip Exercises: Standing   Forward Lunges Right;2 sets;10 reps;3 seconds  12" step   Lateral Step Up Right;3 sets;10 reps;Hand Hold: 2;Step Height: 6"   Forward Step Up Right;3 sets;10 reps;Hand Hold: 2;Step Height: 6"   Manual Therapy   Manual Therapy Passive ROM   Passive ROM PROM of R knee into flexion with gentle holds at end range in sitting                      PT Long Term Goals - 06/30/15 [id number]    PT LONG TERM GOAL #1   Title Ind with a HEP.   Time 4   Period Weeks   Status Achieved   PT LONG TERM GOAL #2   Title Decrease edema to within 4 cms of contralateral side to assist with range of motion gains and decrease pain.   Time 4   Period Weeks   Status On-going   PT LONG TERM GOAL #3   Title Full active right knee extension in order to normalize gait   Time 4   Period Weeks   Status On-going   PT LONG TERM GOAL #4   Title Active knee flexion to 115 degrees+ so the patient can perform functional tasks and do so with pain not > 2-3/10.   Time 4   Period Weeks   Status On-going  Passive 100 degrees.   PT LONG TERM GOAL #5   Title Increase right  knee strength to a solid 4+/5 to provide good stability for accomplishment of functional activities   Time 4   Period Weeks   Status On-going   PT LONG TERM GOAL #6   Title Perform a reciprocating stair gait with one railing with pain not > 2-3/10.   Time 4   Period Weeks   Status On-going               Plan - 07/13/15 1022    Clinical Impression Statement Patient tolerated today's treatment well although she experienced soreness from playing pickleball yesterday. Patient completed all exercises well but with lateral step ups she noticed discomfort in lateral R knee where she has been noted as numb by patient. Patient will be out of town for 2 weeks and was discussed with PT to place her on hold until she returns and will resume therapy. Paitent continues to be limited during medial extra skin at B knee region. Patient experinced 2-3/10 soreness after today's treatment.   Pt will benefit from skilled therapeutic intervention in order to improve on the following deficits Abnormal gait;Decreased activity tolerance;Pain;Decreased range of motion;Increased edema;Decreased strength   Rehab Potential Excellent   PT Frequency 3x / week   PT Duration 4 weeks    PT Treatment/Interventions ADLs/Self Care Home Management;Cryotherapy;Electrical Stimulation;Therapeutic exercise;Moist Heat;Therapeutic activities;Neuromuscular re-education;Patient/family education;Manual  techniques;Vasopneumatic Device;Passive range of motion   PT Next Visit Plan Continue per TKR protocol and MPT POC as tolerated with modalites as needed for pain and inflammation.    PT Home Exercise Plan HEP- HS stretch   Consulted and Agree with Plan of Care Patient        Problem List Patient Active Problem List   Diagnosis Date Noted  . Obese 05/20/2015  . S/P right TKA 05/17/2015  . S/P knee replacement 05/17/2015  . Paroxysmal a-fib ([place]) 07/20/2014  . Prediabetes 01/06/2014  . Dyslipidemia 01/06/2014  . OA (osteoarthritis) 01/06/2014  . S/P laparoscopic sleeve gastrectomy 01/04/14 01/06/2014  . Postoperative atrial fibrillation - resolved 01/06/2014  . Morbid obesity ([place]) 01/04/2014  . Morbid obesity with BMI of 60.0-69.9, adult ([place]) 10/28/2013  . Dyspnea on exertion 09/05/2012  . Essential hypertension 04/21/2007  . [name] 04/21/2007    Wynelle Fanny, PTA  07/13/2015, 11:06 AM  [facility] [address] [place], [place], [address] Phone: [phone]   Fax:  [phone]  Name: MARIANN NIEBEL MRN: [id number] Date of Birth: 23-Feb-1956

## 2015-08-03 ENCOUNTER — Ambulatory Visit: Payer: BC Managed Care – PPO | Attending: Orthopedic Surgery | Admitting: Physical Therapy

## 2015-08-03 ENCOUNTER — Encounter: Payer: Self-pay | Admitting: Physical Therapy

## 2015-08-03 DIAGNOSIS — R601 Generalized edema: Secondary | ICD-10-CM | POA: Diagnosis not present

## 2015-08-03 DIAGNOSIS — M25561 Pain in right knee: Secondary | ICD-10-CM | POA: Insufficient documentation

## 2015-08-03 DIAGNOSIS — M25661 Stiffness of right knee, not elsewhere classified: Secondary | ICD-10-CM | POA: Insufficient documentation

## 2015-08-03 NOTE — Therapy (Signed)
[facility] [place], [place], [address] Phone: [phone]   Fax:  [phone]  Physical Therapy Treatment  Patient Details  Name: Rebekah [name] MRN: [id number] Date of Birth: 10/15/1955 Referring Provider: Paralee Cancel MD.  Encounter Date: 08/03/2015      PT End of Session - 08/03/15 1044    Visit Number 15   Number of Visits 20   Date for PT Re-Evaluation 09/16/15   PT Start Time [id number]   PT Stop Time 1115   PT Time Calculation (min) 46 min   Activity Tolerance Patient tolerated treatment well   Behavior During Therapy [facility] for tasks assessed/performed      Past Medical History  Diagnosis Date  . Arthritis   . Hypertension   . GERD (gastroesophageal reflux disease)   . Obesity     s/p gastric sleeve 12/2013 (previously weighed close to 400 lbs)  . Complication of anesthesia   . PONV (postoperative nausea and vomiting)   . Shortness of breath     WITH EXERTION - uses 02 3L nightly / 5 L  other times with exertion ,  resolved with bariatric surgery and weight loss  . Environmental allergies   . Incontinence of urine     at nite   . Difficulty sleeping     prior sleep study did not reveal sleep apnea per patient  . Paroxysmal atrial fibrillation (HCC)   . Left atrial dilatation   . DJD (degenerative joint disease)   . Heart murmur   . Supplemental oxygen dependent     uses 3l of oxygen at nite when lying flat   . Pneumonia     hx of   . Tuberculosis     had 6 month of INH due to exposure   . Hypothyroidism     Past Surgical History  Procedure Laterality Date  . Tonsillectomy    . Cholecystectomy    . Appendectomy    . Tubal ligation    . Transthoracic echocardiogram  11/2012    EF 60-65%, mod LVH & mod conc hypertrophy, grade 1 diastolic dysfunction; LA mildly dilated; RV systolic pressure increased; PA peak pressure 67mmHg  . Joint replacement  1999    L TOTAL KNEE  . Laparoscopic gastric sleeve  resection N/A 01/04/2014    Procedure: LAPAROSCOPIC GASTRIC SLEEVE RESECTION;  Surgeon: Greer Pickerel, MD;  [facility];  Service: General;  Laterality: N/A;  . Upper gi endoscopy  01/04/2014    Procedure: UPPER GI ENDOSCOPY;  Surgeon: Greer Pickerel, MD;  [facility]: [facility];  Service: General;;  . Eye surgery      03/2014 lens implant left lens   . Total knee arthroplasty Right 05/17/2015    Procedure: RIGHT TOTAL KNEE ARTHROPLASTY;  Surgeon: Paralee Cancel, MD;  Location: [facility];  Service: Orthopedics;  Laterality: Right;    There were no vitals filed for this visit.      Subjective Assessment - 08/03/15 1042    Subjective States that she walked approx. 2 or so miles a day and had more issue with L leg and foot.   Limitations Standing   Patient Stated Goals Get back to normal life.   Currently in Pain? Yes   Pain Score 2    Pain Location Knee   Pain Orientation Right   Pain Descriptors / Indicators Tightness  Hamstring   Pain Type Surgical pain            [facility] PT  Assessment - 08/03/15 0001    Assessment   Medical Diagnosis Right total knee replacement.   Onset Date/Surgical Date 05/17/15   Next MD Visit 08/05/2015   Restrictions   Weight Bearing Restrictions No   ROM / Strength   AROM / PROM / Strength AROM;PROM   AROM   Overall AROM  Deficits   AROM Assessment Site Knee   Right/Left Knee Right   Right Knee Flexion 107   PROM   Overall PROM  Deficits   PROM Assessment Site Knee   Right/Left Knee Right   Right Knee Flexion 115                     [facility] Adult PT Treatment/Exercise - 08/03/15 0001    Knee/Hip Exercises: Aerobic   Nustep L6, seat 6-4 x15 min for L knee flexion   Knee/Hip Exercises: Standing   Forward Lunges Right;2 sets;10 reps;3 seconds  12" step   Lateral Step Up Right;3 sets;10 reps;Hand Hold: 2;Step Height: 6"   Forward Step Up Right;3 sets;10 reps;Hand Hold: 2;Step Height: 6"   Rocker Board 4 minutes   Manual Therapy   Manual  Therapy Passive ROM   Passive ROM PROM of R knee into flexion with gentle holds at end range in sitting                     PT Long Term Goals - 06/30/15 [id number]    PT LONG TERM GOAL #1   Title Ind with a HEP.   Time 4   Period Weeks   Status Achieved   PT LONG TERM GOAL #2   Title Decrease edema to within 4 cms of contralateral side to assist with range of motion gains and decrease pain.   Time 4   Period Weeks   Status On-going   PT LONG TERM GOAL #3   Title Full active right knee extension in order to normalize gait   Time 4   Period Weeks   Status On-going   PT LONG TERM GOAL #4   Title Active knee flexion to 115 degrees+ so the patient can perform functional tasks and do so with pain not > 2-3/10.   Time 4   Period Weeks   Status On-going  Passive 100 degrees.   PT LONG TERM GOAL #5   Title Increase right  knee strength to a solid 4+/5 to provide good stability for accomplishment of functional activities   Time 4   Period Weeks   Status On-going   PT LONG TERM GOAL #6   Title Perform a reciprocating stair gait with one railing with pain not > 2-3/10.   Time 4   Period Weeks   Status On-going               Plan - 08/03/15 1120    Clinical Impression Statement Patient's R knee has improved upon return to PT from being out of town. Arc of motion of R knee flexion is much smoother upon observation. Completed all exercises as directed with no reports of pain or of "hotspot" pain. Seat 4 was reported as much easier for patient on NuStep today. AROM R knee flexion measured as 107 deg and PROM of R knee flexion measured as 115 deg in sitting.    Rehab Potential Excellent   PT Frequency 3x / week   PT Duration 4 weeks   PT Treatment/Interventions ADLs/Self Care Home Management;Cryotherapy;Electrical Stimulation;Therapeutic exercise;Moist Heat;Therapeutic activities;Neuromuscular re-education;Patient/family education;Manual  techniques;Vasopneumatic  Device;Passive range of motion   PT Next Visit Plan Continue per TKR protocol and MPT POC as tolerated with modalites as needed for pain and inflammation.    PT Home Exercise Plan HEP- HS stretch   Consulted and Agree with Plan of Care Patient      Patient will benefit from skilled therapeutic intervention in order to improve the following deficits and impairments:  Abnormal gait, Decreased activity tolerance, Pain, Decreased range of motion, Increased edema, Decreased strength  Visit Diagnosis: Pain in right knee - Plan: PT plan of care cert/re-cert  Stiffness of right knee, not elsewhere classified - Plan: PT plan of care cert/re-cert     Problem List Patient Active Problem List   Diagnosis Date Noted  . Obese 05/20/2015  . S/P right TKA 05/17/2015  . S/P knee replacement 05/17/2015  . Paroxysmal a-fib ([place]) 07/20/2014  . Prediabetes 01/06/2014  . Dyslipidemia 01/06/2014  . OA (osteoarthritis) 01/06/2014  . S/P laparoscopic sleeve gastrectomy 01/04/14 01/06/2014  . Postoperative atrial fibrillation - resolved 01/06/2014  . Morbid obesity ([place]) 01/04/2014  . Morbid obesity with BMI of 60.0-69.9, adult ([place]) 10/28/2013  . Dyspnea on exertion 09/05/2012  . Essential hypertension 04/21/2007  . Hermina Staggers 04/21/2007    Ahmed Prima, PTA 08/03/2015 11:33 AM Mali Applegate MPT [facility] [address] [place], [place], [address] Phone: [phone]   Fax:  [phone]  Name: ALFHILD FEINER MRN: [id number] Date of Birth: [birth year]

## 2015-08-09 ENCOUNTER — Encounter: Payer: Self-pay | Admitting: Physical Therapy

## 2015-08-09 ENCOUNTER — Ambulatory Visit: Payer: BC Managed Care – PPO | Admitting: Physical Therapy

## 2015-08-09 DIAGNOSIS — M25661 Stiffness of right knee, not elsewhere classified: Secondary | ICD-10-CM

## 2015-08-09 DIAGNOSIS — M25561 Pain in right knee: Secondary | ICD-10-CM | POA: Diagnosis not present

## 2015-08-09 NOTE — Therapy (Signed)
[facility] [place], [place], [address] Phone: [phone]   Fax:  [phone]  Physical Therapy Treatment  Patient Details  Name: Rebekah [name] MRN: [id number] Date of Birth: 11/01/1955 Referring Provider: Paralee Cancel MD.  Encounter Date: 08/09/2015      PT End of Session - 08/09/15 1120    Visit Number 16   Number of Visits 20   Date for PT Re-Evaluation 09/16/15   PT Start Time 1121   PT Stop Time 1202  2 units secondary to patient no longer using modalities and appointmet   PT Time Calculation (min) 41 min   Activity Tolerance Patient tolerated treatment well   Behavior During Therapy [facility] for tasks assessed/performed      Past Medical History  Diagnosis Date  . Arthritis   . Hypertension   . GERD (gastroesophageal reflux disease)   . Obesity     s/p gastric sleeve 12/2013 (previously weighed close to 400 lbs)  . Complication of anesthesia   . PONV (postoperative nausea and vomiting)   . Shortness of breath     WITH EXERTION - uses 02 3L nightly / 5 L  other times with exertion ,  resolved with bariatric surgery and weight loss  . Environmental allergies   . Incontinence of urine     at nite   . Difficulty sleeping     prior sleep study did not reveal sleep apnea per patient  . Paroxysmal atrial fibrillation (HCC)   . Left atrial dilatation   . DJD (degenerative joint disease)   . Heart murmur   . Supplemental oxygen dependent     uses 3l of oxygen at nite when lying flat   . Pneumonia     hx of   . Tuberculosis     had 6 month of INH due to exposure   . Hypothyroidism     Past Surgical History  Procedure Laterality Date  . Tonsillectomy    . Cholecystectomy    . Appendectomy    . Tubal ligation    . Transthoracic echocardiogram  11/2012    EF 60-65%, mod LVH & mod conc hypertrophy, grade 1 diastolic dysfunction; LA mildly dilated; RV systolic pressure increased; PA peak pressure 68mHg  . Joint  replacement  1999    L TOTAL KNEE  . Laparoscopic gastric sleeve resection N/A 01/04/2014    Procedure: LAPAROSCOPIC GASTRIC SLEEVE RESECTION;  Surgeon: EGreer Pickerel MD;  Location: [facility];  Service: General;  Laterality: N/A;  . Upper gi endoscopy  01/04/2014    Procedure: UPPER GI ENDOSCOPY;  Surgeon: EGreer Pickerel MD;  Location: [facility];  Service: General;;  . Eye surgery      03/2014 lens implant left lens   . Total knee arthroplasty Right 05/17/2015    Procedure: RIGHT TOTAL KNEE ARTHROPLASTY;  Surgeon: MParalee Cancel MD;  Location: [facility];  Service: Orthopedics;  Laterality: Right;    There were no vitals filed for this visit.      Subjective Assessment - 08/09/15 1120    Subjective Had no new complaints today.   Limitations Standing   Patient Stated Goals Get back to normal life.   Currently in Pain? Other (Comment)  Gave no numerical pain rating for pain            [facility]    Assessment   Medical Diagnosis Right total knee replacement.   Onset Date/Surgical Date 05/17/15   Restrictions  Weight Bearing Restrictions No   Observation/Other Assessments-Edema    Edema Circumferential   Circumferential Edema   Circumferential - Right 56.4   Circumferential - Left  56   ROM / Strength   AROM / PROM / Strength AROM;Strength   AROM   Overall AROM  Deficits   AROM Assessment Site Knee   Right/Left Knee Right   Right Knee Extension 0   Right Knee Flexion 107   Strength   Overall Strength Within functional limits for tasks performed   Strength Assessment Site Knee   Right/Left Knee Right   Right Knee Flexion 5/5   Right Knee Extension 5/5                     [facility] Adult PT Treatment/Exercise - 08/09/15 0001    Knee/Hip Exercises: Aerobic   Nustep L5, seat 5-4, x15 min for knee ROM   Knee/Hip Exercises: Machines for Strengthening   Cybex Knee Extension 10# x20 reps   Cybex Knee Flexion 30# x20 reps   Cybex Leg Press Seat 4, 2  plates x20 reps                     PT Long Term Goals - 08/09/15 1128    PT LONG TERM GOAL #1   Title Ind with a HEP.   Time 4   Period Weeks   Status Achieved   PT LONG TERM GOAL #2   Title Decrease edema to within 4 cms of contralateral side to assist with range of motion gains and decrease pain.   Time 4   Period Weeks   Status Achieved  0.4 cm difference with R>L 08/09/2015   PT LONG TERM GOAL #3   Title Full active right knee extension in order to normalize gait   Time 4   Period Weeks   Status Achieved  AROM R knee ext 0 deg 08/09/2015   PT LONG TERM GOAL #4   Title Active knee flexion to 115 degrees+ so the patient can perform functional tasks and do so with pain not > 2-3/10.   Time 4   Period Weeks   Status Partially Met  AROM R knee flexion 107 deg 08/09/2015   PT LONG TERM GOAL #5   Title Increase right  knee strength to a solid 4+/5 to provide good stability for accomplishment of functional activities   Time 4   Period Weeks   Status Achieved  R knee MMT 5/5 throughout 08/09/2015   PT LONG TERM GOAL #6   Title Perform a reciprocating stair gait with one railing with pain not > 2-3/10.   Time 4   Period Weeks   Status Not Met  Reports pain >2-3/10 pain with stairs per patient report 08/09/2015               Plan - 08/09/15 1320    Clinical Impression Statement Patient tolerated today's treatment well with no reports of R knee pain throughout the treatment. Patient has met or partially met all goals set at evaluation. Able to tolerate machine strengthening as patient is interested in beginning faciltiy's self directed gym program. Patient was educated upon the [place] and how to advance herself with the exercises. AROM of R knee measured as 0-107 deg in supine and MMT of R knee assessed as 5/5. Patient also achieved R knee edema goal with a 0.6 cm difference R>L. Patient continues to have difficulty with stair ambulation and did not  achieve but  patient does not encounter stairs much in her daily activities. Patient notes that descending stairs is the most difficult as she feels pain in Hamstring region.   Rehab Potential Excellent   PT Frequency 3x / week   PT Duration 4 weeks   PT Treatment/Interventions ADLs/Self Care Home Management;Cryotherapy;Electrical Stimulation;Therapeutic exercise;Moist Heat;Therapeutic activities;Neuromuscular re-education;Patient/family education;Manual techniques;Vasopneumatic Device;Passive range of motion   PT Next Visit Plan D/C summary required for patient's chart at this time.   PT Home Exercise Plan HEP- HS stretch   Consulted and Agree with Plan of Care Patient      Patient will benefit from skilled therapeutic intervention in order to improve the following deficits and impairments:  Abnormal gait, Decreased activity tolerance, Pain, Decreased range of motion, Increased edema, Decreased strength  Visit Diagnosis: Pain in right knee  Stiffness of right knee, not elsewhere classified     Problem List Patient Active Problem List   Diagnosis Date Noted  . Obese 05/20/2015  . S/P right TKA 05/17/2015  . S/P knee replacement 05/17/2015  . Paroxysmal a-fib ([place]) 07/20/2014  . Prediabetes 01/06/2014  . Dyslipidemia 01/06/2014  . OA (osteoarthritis) 01/06/2014  . S/P laparoscopic sleeve gastrectomy 01/04/14 01/06/2014  . Postoperative atrial fibrillation - resolved 01/06/2014  . Morbid obesity ([place]) 01/04/2014  . Morbid obesity with BMI of 60.0-69.9, adult ([place]) 10/28/2013  . Dyspnea on exertion 09/05/2012  . Essential hypertension 04/21/2007  . G E R D 04/21/2007   PHYSICAL THERAPY DISCHARGE SUMMARY  Visits from Start of Care: 16  Current functional level related to goals / functional outcomes: Please see above.   Remaining deficits: Right knee flexion to 107 degrees.   Education / Equipment: HEP.  Plan: Patient agrees to discharge.  Patient goals were partially met. Patient is  being discharged due to being pleased with the current functional level.  ?????      Yurika Pereda, [place] MPT 08/09/2015, 5:23 PM  [facility] [address] [place], [place], [address] Phone: [phone]   Fax:  [phone]  Name: Rebekah [name] MRN: [id number] Date of Birth: 1955-12-30

## 2015-08-09 NOTE — Therapy (Signed)
[facility] [place], [place], [address] Phone: [phone]   Fax:  [phone]  Physical Therapy Treatment  Patient Details  Name: Rebekah [name] MRN: [id number] Date of Birth: 11/01/1955 Referring Provider: Paralee Cancel MD.  Encounter Date: 08/09/2015      PT End of Session - 08/09/15 1120    Visit Number 16   Number of Visits 20   Date for PT Re-Evaluation 09/16/15   PT Start Time 1121   PT Stop Time 1202  2 units secondary to patient no longer using modalities and appointmet   PT Time Calculation (min) 41 min   Activity Tolerance Patient tolerated treatment well   Behavior During Therapy [facility] for tasks assessed/performed      Past Medical History  Diagnosis Date  . Arthritis   . Hypertension   . GERD (gastroesophageal reflux disease)   . Obesity     s/p gastric sleeve 12/2013 (previously weighed close to 400 lbs)  . Complication of anesthesia   . PONV (postoperative nausea and vomiting)   . Shortness of breath     WITH EXERTION - uses 02 3L nightly / 5 L  other times with exertion ,  resolved with bariatric surgery and weight loss  . Environmental allergies   . Incontinence of urine     at nite   . Difficulty sleeping     prior sleep study did not reveal sleep apnea per patient  . Paroxysmal atrial fibrillation (HCC)   . Left atrial dilatation   . DJD (degenerative joint disease)   . Heart murmur   . Supplemental oxygen dependent     uses 3l of oxygen at nite when lying flat   . Pneumonia     hx of   . Tuberculosis     had 6 month of INH due to exposure   . Hypothyroidism     Past Surgical History  Procedure Laterality Date  . Tonsillectomy    . Cholecystectomy    . Appendectomy    . Tubal ligation    . Transthoracic echocardiogram  11/2012    EF 60-65%, mod LVH & mod conc hypertrophy, grade 1 diastolic dysfunction; LA mildly dilated; RV systolic pressure increased; PA peak pressure 68mHg  . Joint  replacement  1999    L TOTAL KNEE  . Laparoscopic gastric sleeve resection N/A 01/04/2014    Procedure: LAPAROSCOPIC GASTRIC SLEEVE RESECTION;  Surgeon: EGreer Pickerel MD;  [facility];  Service: General;  Laterality: N/A;  . Upper gi endoscopy  01/04/2014    Procedure: UPPER GI ENDOSCOPY;  Surgeon: EGreer Pickerel MD;  [facility];  Service: General;;  . Eye surgery      03/2014 lens implant left lens   . Total knee arthroplasty Right 05/17/2015    Procedure: RIGHT TOTAL KNEE ARTHROPLASTY;  Surgeon: MParalee Cancel MD;  [facility];  Service: Orthopedics;  Laterality: Right;    There were no vitals filed for this visit.      Subjective Assessment - 08/09/15 1120    Subjective Had no new complaints today.   Limitations Standing   Patient Stated Goals Get back to normal life.   Currently in Pain? Other (Comment)  Gave no numerical pain rating for pain            [facility] - [facility]    Assessment   Medical Diagnosis Right total knee replacement.   Onset Date/Surgical Date 05/17/15   Restrictions  Weight Bearing Restrictions No   Observation/Other Assessments-Edema    Edema Circumferential   Circumferential Edema   Circumferential - Right 56.4   Circumferential - Left  56   ROM / Strength   AROM / PROM / Strength AROM;Strength   AROM   Overall AROM  Deficits   AROM Assessment Site Knee   Right/Left Knee Right   Right Knee Extension 0   Right Knee Flexion 107   Strength   Overall Strength Within functional limits for tasks performed   Strength Assessment Site Knee   Right/Left Knee Right   Right Knee Flexion 5/5   Right Knee Extension 5/5                     [facility] Adult PT Treatment/Exercise - 08/09/15 0001    Knee/Hip Exercises: Aerobic   Nustep L5, seat 5-4, x15 min for knee ROM   Knee/Hip Exercises: Machines for Strengthening   Cybex Knee Extension 10# x20 reps   Cybex Knee Flexion 30# x20 reps   Cybex Leg Press Seat 4, 2  plates x20 reps                     PT Long Term Goals - 08/09/15 1128    PT LONG TERM GOAL #1   Title Ind with a HEP.   Time 4   Period Weeks   Status Achieved   PT LONG TERM GOAL #2   Title Decrease edema to within 4 cms of contralateral side to assist with range of motion gains and decrease pain.   Time 4   Period Weeks   Status Achieved  0.4 cm difference with R>L 08/09/2015   PT LONG TERM GOAL #3   Title Full active right knee extension in order to normalize gait   Time 4   Period Weeks   Status Achieved  AROM R knee ext 0 deg 08/09/2015   PT LONG TERM GOAL #4   Title Active knee flexion to 115 degrees+ so the patient can perform functional tasks and do so with pain not > 2-3/10.   Time 4   Period Weeks   Status Partially Met  AROM R knee flexion 107 deg 08/09/2015   PT LONG TERM GOAL #5   Title Increase right  knee strength to a solid 4+/5 to provide good stability for accomplishment of functional activities   Time 4   Period Weeks   Status Achieved  R knee MMT 5/5 throughout 08/09/2015   PT LONG TERM GOAL #6   Title Perform a reciprocating stair gait with one railing with pain not > 2-3/10.   Time 4   Period Weeks   Status Not Met  Reports pain >2-3/10 pain with stairs per patient report 08/09/2015               Plan - 08/09/15 1320    Clinical Impression Statement Patient tolerated today's treatment well with no reports of R knee pain throughout the treatment. Patient has met or partially met all goals set at evaluation. Able to tolerate machine strengthening as patient is interested in beginning faciltiy's self directed gym program. Patient was educated upon the [place] and how to advance herself with the exercises. AROM of R knee measured as 0-107 deg in supine and MMT of R knee assessed as 5/5. Patient also achieved R knee edema goal with a 0.6 cm difference R>L. Patient continues to have difficulty with stair ambulation and did not  achieve but  patient does not encounter stairs much in her daily activities. Patient notes that descending stairs is the most difficult as she feels pain in Hamstring region.   Rehab Potential Excellent   PT Frequency 3x / week   PT Duration 4 weeks   PT Treatment/Interventions ADLs/Self Care Home Management;Cryotherapy;Electrical Stimulation;Therapeutic exercise;Moist Heat;Therapeutic activities;Neuromuscular re-education;Patient/family education;Manual techniques;Vasopneumatic Device;Passive range of motion   PT Next Visit Plan D/C summary required for patient's chart at this time.   PT Home Exercise Plan HEP- HS stretch   Consulted and Agree with Plan of Care Patient      Patient will benefit from skilled therapeutic intervention in order to improve the following deficits and impairments:  Abnormal gait, Decreased activity tolerance, Pain, Decreased range of motion, Increased edema, Decreased strength  Visit Diagnosis: Pain in right knee  Stiffness of right knee, not elsewhere classified     Problem List Patient Active Problem List   Diagnosis Date Noted  . Obese 05/20/2015  . S/P right TKA 05/17/2015  . S/P knee replacement 05/17/2015  . Paroxysmal a-fib ([place]) 07/20/2014  . Prediabetes 01/06/2014  . Dyslipidemia 01/06/2014  . OA (osteoarthritis) 01/06/2014  . S/P laparoscopic sleeve gastrectomy 01/04/14 01/06/2014  . Postoperative atrial fibrillation - resolved 01/06/2014  . Morbid obesity ([place]) 01/04/2014  . Morbid obesity with BMI of 60.0-69.9, adult ([place]) 10/28/2013  . Dyspnea on exertion 09/05/2012  . Essential hypertension 04/21/2007  . Hermina Staggers 04/21/2007    Ahmed Prima, PTA 08/09/2015 5:13 PM  [facility] [place], [place], [address] Phone: [phone]   Fax:  [phone]  Name: Rebekah [name] MRN: [id number] Date of Birth: [birth year]

## 2015-08-10 ENCOUNTER — Other Ambulatory Visit: Payer: Self-pay | Admitting: Internal Medicine

## 2015-08-11 NOTE — Telephone Encounter (Signed)
Rx has been sent to the pharmacy electronically. ° °

## 2015-08-30 ENCOUNTER — Other Ambulatory Visit: Payer: Self-pay | Admitting: Internal Medicine

## 2015-11-02 ENCOUNTER — Encounter (HOSPITAL_COMMUNITY): Payer: Self-pay | Admitting: Nurse Practitioner

## 2015-11-02 ENCOUNTER — Ambulatory Visit (HOSPITAL_COMMUNITY)
Admission: RE | Admit: 2015-11-02 | Discharge: 2015-11-02 | Disposition: A | Discharge status: Home / Self Care | Payer: BC Managed Care – PPO | Source: Ambulatory Visit | Attending: Nurse Practitioner | Admitting: Nurse Practitioner

## 2015-11-02 VITALS — BP 138/82 | HR 64 | Ht 62.0 in | Wt 187.2 lb

## 2015-11-02 DIAGNOSIS — R9431 Abnormal electrocardiogram [ECG] [EKG]: Secondary | ICD-10-CM | POA: Diagnosis not present

## 2015-11-02 DIAGNOSIS — I480 Paroxysmal atrial fibrillation: Secondary | ICD-10-CM | POA: Insufficient documentation

## 2015-11-02 DIAGNOSIS — I517 Cardiomegaly: Secondary | ICD-10-CM | POA: Diagnosis not present

## 2015-11-02 DIAGNOSIS — I4891 Unspecified atrial fibrillation: Secondary | ICD-10-CM | POA: Diagnosis present

## 2015-11-03 NOTE — Progress Notes (Signed)
Patient ID: Rebekah [name], female   DOB: 1956/03/24, 60 y.o.   MRN: [id number]     Primary Care Physician: Terald Sleeper, PA Referring Physician: Dr. Jearld Lesch is a 60 y.o. female with a h/o PAF, that is in the afib clinic for f/u.She  is s/p bariatric surgery 9/15 and has lost 200 lbs since surgery. She has PIP flecainide for breakthrough afib but has only had to use it 2x in last year. Most recent epiosde was 2 weeks post op knee replacement. She took short acting cardizem, as she reports high v rates and flecainide and went on for a post op knee visit. Orthopedics office in is same building of Dr. Debara Pickett and she was asked to be seen by cardiology that same day. When seen by Dr. Debara Pickett, she was feeling improved and had converted.She has not had any further issues with afib. She is currently happy with management of afib with PIP flecainide with short acting Cardizem and is on long acting Cardizem daily. She continues on xarelto with a chadsvasc score of 2.  Today, she denies symptoms of palpitations, chest pain, shortness of breath, orthopnea, PND, lower extremity edema, dizziness, presyncope, syncope, or neurologic sequela. The patient is tolerating medications without difficulties and is otherwise without complaint today.   Past Medical History  Diagnosis Date  . Arthritis   . Hypertension   . GERD (gastroesophageal reflux disease)   . Obesity     s/p gastric sleeve 12/2013 (previously weighed close to 400 lbs)  . Complication of anesthesia   . PONV (postoperative nausea and vomiting)   . Shortness of breath     WITH EXERTION - uses 02 3L nightly / 5 L  other times with exertion ,  resolved with bariatric surgery and weight loss  . Environmental allergies   . Incontinence of urine     at nite   . Difficulty sleeping     prior sleep study did not reveal sleep apnea per patient  . Paroxysmal atrial fibrillation (HCC)   . Left atrial dilatation   . DJD (degenerative joint  disease)   . Heart murmur   . Supplemental oxygen dependent     uses 3l of oxygen at nite when lying flat   . Pneumonia     hx of   . Tuberculosis     had 6 month of INH due to exposure   . Hypothyroidism    Past Surgical History  Procedure Laterality Date  . Tonsillectomy    . Cholecystectomy    . Appendectomy    . Tubal ligation    . Transthoracic echocardiogram  11/2012    EF 60-65%, mod LVH & mod conc hypertrophy, grade 1 diastolic dysfunction; LA mildly dilated; RV systolic pressure increased; PA peak pressure 109mmHg  . Joint replacement  1999    L TOTAL KNEE  . Laparoscopic gastric sleeve resection N/A 01/04/2014    Procedure: LAPAROSCOPIC GASTRIC SLEEVE RESECTION;  Surgeon: Greer Pickerel, MD;  [facility];  Service: General;  Laterality: N/A;  . Upper gi endoscopy  01/04/2014    Procedure: UPPER GI ENDOSCOPY;  Surgeon: Greer Pickerel, MD;  Location: [facility];  Service: General;;  . Eye surgery      03/2014 lens implant left lens   . Total knee arthroplasty Right 05/17/2015    Procedure: RIGHT TOTAL KNEE ARTHROPLASTY;  Surgeon: Paralee Cancel, MD;  Location: [facility];  Service: Orthopedics;  Laterality: Right;  Current Outpatient Prescriptions  Medication Sig Dispense Refill  . acetaminophen (TYLENOL) 500 MG tablet Take 500-1,000 mg by mouth every 6 (six) hours as needed for moderate pain or headache.    Marland Kitchen BIOTIN PO Take 5,000 mcg by mouth daily.     Marland Kitchen CALCIUM PO Take 1,000 mg by mouth daily.    . cetirizine (ZYRTEC) 10 MG tablet Take 10 mg by mouth at bedtime.     . Cyanocobalamin (VITAMIN B 12 PO) Take 500 mcg by mouth daily.    Marland Kitchen diltiazem (CARDIZEM) 30 MG tablet Take 1-2 tablets by mouth every 6 hours as needed for fast heart rates 30 tablet 1  . diltiazem (DILACOR XR) 120 MG 24 hr capsule Take 1 capsule (120 mg total) by mouth daily. 90 capsule 3  . docusate sodium (COLACE) 100 MG capsule Take 1 capsule (100 mg total) by mouth 2 (two) times daily. 10 capsule 0  .  EPINEPHrine (EPIPEN) 0.3 mg/0.3 mL DEVI Inject 0.3 mg into the muscle once as needed (anaphylaxis).     Marland Kitchen escitalopram (LEXAPRO) 10 MG tablet Take 10 mg by mouth daily.    . flecainide (TAMBOCOR) 100 MG tablet Take 3 tablets by mouth at onset of fast heart rates as needed 30 tablet 1  . furosemide (LASIX) 20 MG tablet Take 1 tablet (20 mg total) by mouth daily as needed for edema. 30 tablet 3  . levothyroxine (SYNTHROID, LEVOTHROID) 50 MCG tablet Take 50 mcg by mouth daily before breakfast.    . Multiple Vitamins-Minerals (MULTIVITAMIN ADULT PO) Take 1 capsule by mouth 2 (two) times daily. Journey 3+3 Bariatric Multivitamin    . OXYGEN Inhale 3 L/min into the lungs at bedtime. Continuously    . potassium chloride SA (K-DUR,KLOR-CON) 20 MEQ tablet Take 1 tablet (20 mEq total) by mouth daily as needed (take when taking lasix). 30 tablet 3  . XARELTO 20 MG TABS tablet TAKE ONE TABLET BY MOUTH ONCE DAILY WITH SUPPER 90 tablet 1   No current facility-administered medications for this encounter.    Allergies  Allergen Reactions  . Mucinex [Guaifenesin Er] Anaphylaxis  . Ace Inhibitors Swelling and Cough    Pedal Edema  . Codeine Nausea Only and Other (See Comments)    Stomach Ache, face itches  . Propoxyphene Hcl Other (See Comments)    REACTION: hallucinate- [place]von  . Hydromet [Hydrocodone-Homatropine] Itching and Other (See Comments)    Severe stomach pain-face itches.   . Sulfonamide Derivatives Rash    Social History   Social History  . Marital Status: Married    Spouse Name: N/A  . Number of Children: 2  . Years of Education: N/A   Occupational History  . special education teacher Other    [place]  . 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 Smoking status: Never Smoker   . Smokeless tobacco: Never Used  . Alcohol Use: No  . Drug Use: No  . Sexual Activity: Not on file   Other Topics Concern  . 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 Diabetes Father   . Diabetes Maternal Grandmother   . Uterine cancer Mother   . Cervical cancer Mother   . Breast cancer Mother   . Cancer  Mother     cervical and brreast cancer    ROS- All systems are reviewed and negative except as per the HPI above  Physical Exam: Filed Vitals:   11/02/15 1001  BP: 138/82  Pulse: 64  Height: 5\' 2"  (1.575 m)  Weight: 187 lb 3.2 oz (84.913 kg)    GEN- The patient is well appearing, alert and oriented x 3 today.   Head- normocephalic, atraumatic Eyes-  Sclera clear, conjunctiva pink Ears- hearing intact Oropharynx- clear Neck- supple, no JVP Lymph- no cervical lymphadenopathy Lungs- Clear to ausculation bilaterally, normal work of breathing Heart- Regular rate and rhythm, no murmurs, rubs or gallops, PMI not laterally displaced GI- soft, NT, ND, + BS Extremities- no clubbing, cyanosis, or edema MS- no significant deformity or atrophy Skin- no rash or lesion Psych- euthymic mood, full affect Neuro- strength and sensation are intact  EKG-NSR at 64 bpm, pr int 136 ms, qrs int 96 ms, qtc 414 ms Epic records reviewed  Assessment and Plan: 1. PAF Currently happy to treat afib episodes with PIP approach with flecainide Currently low afib burden Continue diltiazem, long acting and short acting as needed Continue xarelto 20 mg daily  2. Morbid obesity Improved after 200 lb  weight loss with bariatric surgery Continue weight loss efforts thru close follow up with bariatric support group  3. HTN stable  F/u in December Dr. Debara Pickett as scheduled   Geroge Baseman. Jacolyn Joaquin, [facility] [address] [place], [place] [address] [phone]

## 2015-11-07 ENCOUNTER — Encounter: Payer: Self-pay | Admitting: Internal Medicine

## 2015-11-07 ENCOUNTER — Encounter (HOSPITAL_COMMUNITY): Payer: Self-pay | Admitting: Nurse Practitioner

## 2015-11-24 ENCOUNTER — Encounter: Payer: Self-pay | Admitting: Internal Medicine

## 2015-12-27 ENCOUNTER — Encounter: Payer: Self-pay | Admitting: Dietician

## 2015-12-27 ENCOUNTER — Encounter: Payer: BC Managed Care – PPO | Attending: General Surgery | Admitting: Dietician

## 2015-12-27 DIAGNOSIS — Z713 Dietary counseling and surveillance: Secondary | ICD-10-CM | POA: Diagnosis not present

## 2015-12-27 DIAGNOSIS — Z9884 Bariatric surgery status: Secondary | ICD-10-CM | POA: Insufficient documentation

## 2015-12-27 NOTE — Patient Instructions (Addendum)
-  Continue to eat protein first, then vegetables, then carbs -Continue to preportion foods -Increase weight bearing activity (PT gym)

## 2015-12-27 NOTE — Progress Notes (Signed)
  Follow-up visit:  2 years Post-Operative Sleeve gastrectomy Surgery  Medical Nutrition Therapy:  Appt start time: 9169 end time:  1150.  Primary concerns today: Post-operative Bariatric Surgery Nutrition Management.  Rebekah [name] returns today for a bariatric nutrition follow up. She has been seeing a dietitian in private practice for the past year and a half because she was covered under her insurance. [name] states that this RD is no longer working in the bariatric specialty and wanted to follow up here since she has met her out of pocket for the year. Patient reports that she is proud of her weight loss but would like to lose more. She reports struggling with A-fib and states she has "gained more doctors since I've lost weight." Highest weight 411 lbs per patient. Lowest weight: 180 lbs and then immediately went up with 185-195 lbs. Currently weighs 206.6 lbs. She states she does not sleep well, may only sleep 2-3 hours a night and sometimes not at all; then sometimes may sleep excessively. Sleep pattern is erratic overall. Sleep meds are not recommended for her due to heartrate drops at night. Does not do a lot of organized activity due to joint issues but states she is more active overall. Playing pickleball. Patient reports eating 500-700 calories per day.   TANITA  BODY COMP RESULTS  11/30/13 01/19/14 03/04/14 04/22/14 12/27/15   BMI (kg/m^2) 67.5 60.4 55.2 52 37.8   Fat Mass (lbs) 196 160.0 142.5 131.5 74.8   Fat Free Mass (lbs) 161.5 170.5 159.5 153 131.8   Total Body Water (lbs) 118 125.0 117 112 94.2    Preferred Learning Style:  No preference indicated   Learning Readiness:  Ready  Change in progress  24-hr recall: B (AM): Premier protein shake Snk (AM): none   L (PM): leftover meat and vegetable OR cottage cheese with apple slices, sometimes a few peanuts (less than an ounce) Snk (PM):   D (PM): 3-4 oz chicken or steak, vegetables, sometimes fruit  Snk (PM): bariatric-friendly  cheesecake  Fluid intake: 80-90 oz per day of decaf tea with sweetener (does not like plain water) Estimated total protein intake: 60+ grams per day  Medications: see list Supplementation: yes  Recent physical activity:  Pickleball, walking, and playing with grandson  Progress Towards Goal(s):  In progress.  Handouts given during visit include:  none   Nutritional Diagnosis:  [name]-3.4 Unintentional weight gain As related to 2 years post op bariatric surgery and patient recently went out of town and increased energy intake.  As evidenced by patient reports recent weight gain of 13 pounds. .    Intervention:  Nutrition counseling provided. Congratulated patient on her weight loss and praised her for sticking with her healthy habits. Encouraged her to continue to eat protein foods first, then vegetables. Recommended increased physical activity as tolerated. Encouraged the patient to be kind to herself and focus on non scale victories.   Teaching Method Utilized:  Visual Auditory Hands on  Barriers to learning/adherence to lifestyle change: joint pain  Demonstrated degree of understanding via:  Teach Back   Monitoring/Evaluation:  Dietary intake, exercise, and body weight. Patient to call to follow up before the end of the year.

## 2016-01-13 ENCOUNTER — Telehealth (HOSPITAL_COMMUNITY): Payer: Self-pay | Admitting: *Deleted

## 2016-01-13 NOTE — Telephone Encounter (Signed)
Pt called in stating she had a bout of Afib last night in which she took PIP flecainide as well as several doses of PRN cardizem.  Pt states after about 6 hours she did convert back into SR. Pt just feels fatigued today but otherwise without complaint. Instructed pt to call if breakthrough afib becomes more frequent and will bring in sooner than scheduled appt in Dec. Reminded pt she cannot take PIP flecainide for next 4 days pt verbalized understanding.

## 2016-01-31 ENCOUNTER — Telehealth: Payer: Self-pay

## 2016-01-31 NOTE — Telephone Encounter (Signed)
Received Medical clearance, form completed by Dr Debara Pickett and faxed back to [facility]. Copy of medical clearance form will be scanned in to chart.

## 2016-02-01 ENCOUNTER — Other Ambulatory Visit: Payer: Self-pay | Admitting: Orthopedic Surgery

## 2016-02-08 ENCOUNTER — Encounter: Payer: Self-pay | Admitting: Dietician

## 2016-02-08 ENCOUNTER — Encounter: Payer: BC Managed Care – PPO | Attending: General Surgery | Admitting: Dietician

## 2016-02-08 DIAGNOSIS — Z713 Dietary counseling and surveillance: Secondary | ICD-10-CM | POA: Diagnosis present

## 2016-02-08 DIAGNOSIS — Z9884 Bariatric surgery status: Secondary | ICD-10-CM | POA: Diagnosis not present

## 2016-02-08 NOTE — Patient Instructions (Addendum)
-  Increase weight bearing activity (PT gym)  -Increase calorie intake  -Try having a solid breakfast (1/4 cup steel cut oatmeal) and a shake midmorning if you need it    -3 solid meals

## 2016-02-08 NOTE — Progress Notes (Signed)
Follow-up visit:  2 years Post-Operative Sleeve gastrectomy Surgery  Medical Nutrition Therapy:  Appt start time: 1100 end time:  60  Primary concerns today: Post-operative Bariatric Surgery Nutrition Management.  Rebekah [name] returns today for a bariatric nutrition follow up. She has been seeing a dietitian in private practice for the past year and a half because she was covered under her insurance. Rebekah [name] states that this RD is no longer working in the bariatric specialty and wanted to follow up here since she has met her out of pocket for the year. Patient reports that she is proud of her weight loss but would like to lose more. She reports struggling with A-fib and states she has "gained more doctors since I've lost weight." Highest weight 411 lbs per patient. Lowest weight: 180 lbs and then immediately went up with 185-195 lbs. Currently weighs 206.6 lbs. She states she does not sleep well, may only sleep 2-3 hours a night and sometimes not at all; then sometimes may sleep excessively. Sleep pattern is erratic overall. Sleep meds are not recommended for her due to heartrate drops at night. Does not do a lot of organized activity due to joint issues but states she is more active overall. Playing pickleball. Patient reports eating 500-700 calories per day.   Rebekah [name] returns having lost 2 pounds in the last 6 weeks. She would still like to lose another 50 pounds and is interested in increasing her calorie intake in an attempt to jumpstart her metabolism. Having foot surgery at the end of November and going back to [place] this weekend.    TANITA  BODY COMP RESULTS  11/30/13 01/19/14 03/04/14 04/22/14 12/27/15 02/08/16   BMI (kg/m^2) 67.5 60.4 55.2 52 37.8 37.3   Fat Mass (lbs) 196 160.0 142.5 131.5 74.8 77.2   Fat Free Mass (lbs) 161.5 170.5 159.5 153 131.8 126.8   Total Body Water (lbs) 118 125.0 117 112 94.2 90.6    Preferred Learning Style:  No preference indicated   Learning  Readiness:  Ready  Change in progress  24-hr recall: B (6:30-8 AM): Premier protein shake Snk (AM): none   L (11:30-12:30 PM): leftover meat and vegetable OR cottage cheese with apple slices, sometimes a few peanuts (less than an ounce) Snk (PM):   D (PM): 3-4 oz chicken or steak, vegetables, sometimes fruit  Snk (PM): bariatric-friendly cheesecake  Fluid intake: 80-90 oz per day of decaf tea with sweetener (does not like plain water) Estimated total protein intake: 60+ grams per day  Medications: see list Supplementation: yes  Recent physical activity:  Pickleball, walking, and playing with grandson  Progress Towards Goal(s):  In progress.  Handouts given during visit include:  none   Nutritional Diagnosis:  [place]-3.4 Unintentional weight gain As related to 2 years post op bariatric surgery and patient recently went out of town and increased energy intake.  As evidenced by patient reports recent weight gain of 13 pounds. .    Intervention:  Nutrition counseling provided. Congratulated patient on her weight loss and praised her for sticking with her healthy habits. Encouraged her to continue to eat protein foods first, then vegetables. Recommended increased physical activity as tolerated. Encouraged the patient to be kind to herself and focus on non scale victories.   Teaching Method Utilized:  Visual Auditory Hands on  Barriers to learning/adherence to lifestyle change: joint pain  Demonstrated degree of understanding via:  Teach Back   Monitoring/Evaluation:  Dietary intake, exercise, and body weight. Patient to call to  follow up before the end of the year.

## 2016-02-09 ENCOUNTER — Telehealth: Payer: Self-pay | Admitting: *Deleted

## 2016-02-09 NOTE — Telephone Encounter (Signed)
Patient is needing clearance for gastrocnemius recession as well as subtalar and talonavicular joint arthrodesis. They are also requesting directions regarding xarelto. Will forward for dr hilty's review and advise.

## 2016-02-10 NOTE — Telephone Encounter (Signed)
Low to intermediate risk for surgery. Hold Xarelto for 2 days prior and restart after.  Dr. Lemmie Evens

## 2016-02-14 NOTE — Telephone Encounter (Signed)
Will forward this note to the number provided. 

## 2016-03-06 ENCOUNTER — Other Ambulatory Visit: Payer: Self-pay | Admitting: Internal Medicine

## 2016-03-12 ENCOUNTER — Encounter (HOSPITAL_BASED_OUTPATIENT_CLINIC_OR_DEPARTMENT_OTHER): Payer: Self-pay | Admitting: *Deleted

## 2016-03-12 DIAGNOSIS — Z6839 Body mass index (BMI) 39.0-39.9, adult: Secondary | ICD-10-CM | POA: Diagnosis not present

## 2016-03-12 DIAGNOSIS — K219 Gastro-esophageal reflux disease without esophagitis: Secondary | ICD-10-CM | POA: Diagnosis not present

## 2016-03-12 DIAGNOSIS — I10 Essential (primary) hypertension: Secondary | ICD-10-CM | POA: Diagnosis not present

## 2016-03-12 DIAGNOSIS — J449 Chronic obstructive pulmonary disease, unspecified: Secondary | ICD-10-CM | POA: Diagnosis not present

## 2016-03-12 DIAGNOSIS — M76822 Posterior tibial tendinitis, left leg: Secondary | ICD-10-CM | POA: Diagnosis not present

## 2016-03-12 DIAGNOSIS — M6702 Short Achilles tendon (acquired), left ankle: Secondary | ICD-10-CM | POA: Diagnosis not present

## 2016-03-12 DIAGNOSIS — I4891 Unspecified atrial fibrillation: Secondary | ICD-10-CM | POA: Diagnosis not present

## 2016-03-12 DIAGNOSIS — Z9981 Dependence on supplemental oxygen: Secondary | ICD-10-CM | POA: Diagnosis not present

## 2016-03-12 DIAGNOSIS — M1990 Unspecified osteoarthritis, unspecified site: Secondary | ICD-10-CM | POA: Diagnosis not present

## 2016-03-12 NOTE — Progress Notes (Signed)
Per Dr Debara Pickett, pt will hold Xarelto 2d prior to surgery. Chart reviewed by Dr Ola Spurr, aware that patient uses supplemental O2 at night,she states that her oxygen drops when her HR drops when asleep. No hx OSA. Plan on [place] stay.

## 2016-03-15 ENCOUNTER — Encounter (HOSPITAL_BASED_OUTPATIENT_CLINIC_OR_DEPARTMENT_OTHER): Payer: Self-pay | Admitting: Certified Registered"

## 2016-03-15 ENCOUNTER — Encounter (HOSPITAL_BASED_OUTPATIENT_CLINIC_OR_DEPARTMENT_OTHER)
Admission: RE | Disposition: A | Discharge status: Home / Self Care | Payer: Self-pay | Source: Ambulatory Visit | Attending: Orthopedic Surgery

## 2016-03-15 ENCOUNTER — Ambulatory Visit (HOSPITAL_BASED_OUTPATIENT_CLINIC_OR_DEPARTMENT_OTHER): Payer: BC Managed Care – PPO | Admitting: Certified Registered"

## 2016-03-15 ENCOUNTER — Ambulatory Visit (HOSPITAL_BASED_OUTPATIENT_CLINIC_OR_DEPARTMENT_OTHER)
Admission: RE | Admit: 2016-03-15 | Discharge: 2016-03-15 | Disposition: A | Discharge status: Home / Self Care | Payer: BC Managed Care – PPO | Source: Ambulatory Visit | Attending: Orthopedic Surgery | Admitting: Orthopedic Surgery

## 2016-03-15 DIAGNOSIS — M1990 Unspecified osteoarthritis, unspecified site: Secondary | ICD-10-CM | POA: Insufficient documentation

## 2016-03-15 DIAGNOSIS — M6702 Short Achilles tendon (acquired), left ankle: Secondary | ICD-10-CM | POA: Insufficient documentation

## 2016-03-15 DIAGNOSIS — J449 Chronic obstructive pulmonary disease, unspecified: Secondary | ICD-10-CM | POA: Insufficient documentation

## 2016-03-15 DIAGNOSIS — Z9981 Dependence on supplemental oxygen: Secondary | ICD-10-CM | POA: Insufficient documentation

## 2016-03-15 DIAGNOSIS — M76822 Posterior tibial tendinitis, left leg: Secondary | ICD-10-CM | POA: Insufficient documentation

## 2016-03-15 DIAGNOSIS — I4891 Unspecified atrial fibrillation: Secondary | ICD-10-CM | POA: Insufficient documentation

## 2016-03-15 DIAGNOSIS — Z6839 Body mass index (BMI) 39.0-39.9, adult: Secondary | ICD-10-CM | POA: Insufficient documentation

## 2016-03-15 DIAGNOSIS — I10 Essential (primary) hypertension: Secondary | ICD-10-CM | POA: Insufficient documentation

## 2016-03-15 DIAGNOSIS — K219 Gastro-esophageal reflux disease without esophagitis: Secondary | ICD-10-CM | POA: Insufficient documentation

## 2016-03-15 HISTORY — PX: GASTROC RECESSION EXTREMITY: SHX6262

## 2016-03-15 HISTORY — DX: Cardiac arrhythmia, unspecified: I49.9

## 2016-03-15 HISTORY — PX: FOOT ARTHRODESIS: SHX1655

## 2016-03-15 SURGERY — RECESSION, TENDON, GASTROCNEMIUS
Anesthesia: General | Site: Leg Lower | Laterality: Left

## 2016-03-15 MED ORDER — PROPOFOL 10 MG/ML IV BOLUS
INTRAVENOUS | Status: DC | PRN
  Administered 2016-03-15: 200 mg via INTRAVENOUS

## 2016-03-15 MED ORDER — CEFAZOLIN SODIUM-DEXTROSE 2-4 GM/100ML-% IV SOLN
INTRAVENOUS | Status: AC
  Filled 2016-03-15: qty 100

## 2016-03-15 MED ORDER — CEFAZOLIN SODIUM-DEXTROSE 2-4 GM/100ML-% IV SOLN
2.0000 g | INTRAVENOUS | Status: AC
  Administered 2016-03-15: 2 g via INTRAVENOUS

## 2016-03-15 MED ORDER — FENTANYL CITRATE (PF) 100 MCG/2ML IJ SOLN
INTRAMUSCULAR | Status: AC
  Filled 2016-03-15: qty 2

## 2016-03-15 MED ORDER — 0.9 % SODIUM CHLORIDE (POUR BTL) OPTIME
TOPICAL | Status: DC | PRN
  Administered 2016-03-15: 250 mL

## 2016-03-15 MED ORDER — ONDANSETRON HCL 4 MG/2ML IJ SOLN
INTRAMUSCULAR | Status: DC | PRN
  Administered 2016-03-15: 4 mg via INTRAVENOUS

## 2016-03-15 MED ORDER — LACTATED RINGERS IV SOLN
INTRAVENOUS | Status: DC
  Administered 2016-03-15: 09:00:00 via INTRAVENOUS

## 2016-03-15 MED ORDER — DOCUSATE SODIUM 100 MG PO CAPS
100.0000 mg | ORAL_CAPSULE | Freq: Two times a day (BID) | ORAL | 0 refills | Status: DC
Start: 2016-03-15 — End: 2016-04-20

## 2016-03-15 MED ORDER — SODIUM CHLORIDE 0.9 % IV SOLN: INTRAVENOUS | Status: DC

## 2016-03-15 MED ORDER — FENTANYL CITRATE (PF) 100 MCG/2ML IJ SOLN
50.0000 ug | INTRAMUSCULAR | Status: DC | PRN
  Administered 2016-03-15: 100 ug via INTRAVENOUS

## 2016-03-15 MED ORDER — OXYCODONE HCL 5 MG PO TABS: 5.0000 mg | ORAL_TABLET | ORAL | 0 refills | Status: DC | PRN

## 2016-03-15 MED ORDER — BUPIVACAINE-EPINEPHRINE (PF) 0.5% -1:200000 IJ SOLN
INTRAMUSCULAR | Status: DC | PRN
  Administered 2016-03-15: 30 mL via PERINEURAL

## 2016-03-15 MED ORDER — SENNA 8.6 MG PO TABS: 2.0000 | ORAL_TABLET | Freq: Two times a day (BID) | ORAL | 0 refills | Status: DC

## 2016-03-15 MED ORDER — MIDAZOLAM HCL 2 MG/2ML IJ SOLN
1.0000 mg | INTRAMUSCULAR | Status: DC | PRN
  Administered 2016-03-15: 2 mg via INTRAVENOUS

## 2016-03-15 MED ORDER — BUPIVACAINE HCL (PF) 0.5 % IJ SOLN
INTRAMUSCULAR | Status: DC | PRN
  Administered 2016-03-15: 15 mL

## 2016-03-15 MED ORDER — DEXAMETHASONE SODIUM PHOSPHATE 10 MG/ML IJ SOLN
INTRAMUSCULAR | Status: DC | PRN
  Administered 2016-03-15: 4 mg via INTRAVENOUS

## 2016-03-15 MED ORDER — LIDOCAINE HCL (CARDIAC) 20 MG/ML IV SOLN
INTRAVENOUS | Status: DC | PRN
  Administered 2016-03-15: 30 mg via INTRAVENOUS

## 2016-03-15 MED ORDER — MIDAZOLAM HCL 2 MG/2ML IJ SOLN
INTRAMUSCULAR | Status: AC
  Filled 2016-03-15: qty 2

## 2016-03-15 MED ORDER — SCOPOLAMINE 1 MG/3DAYS TD PT72: 1.0000 | MEDICATED_PATCH | Freq: Once | TRANSDERMAL | Status: DC | PRN

## 2016-03-15 MED ORDER — CHLORHEXIDINE GLUCONATE 4 % EX LIQD: 60.0000 mL | Freq: Once | CUTANEOUS | Status: DC

## 2016-03-15 SURGICAL SUPPLY — 77 items
BANDAGE ESMARK 6X9 LF (GAUZE/BANDAGES/DRESSINGS) ×2 IMPLANT
BIT DRILL 4.8X200 CANN (BIT) ×3 IMPLANT
BIT DRILL CANN 3.5MM (DRILL) ×2 IMPLANT
BLADE AVERAGE 25X9 (BLADE) IMPLANT
BLADE MICRO SAGITTAL (BLADE) ×3 IMPLANT
BLADE SURG 15 STRL LF DISP TIS (BLADE) ×4 IMPLANT
BLADE SURG 15 STRL SS (BLADE) ×6
BNDG CMPR 9X4 STRL LF SNTH (GAUZE/BANDAGES/DRESSINGS)
BNDG CMPR 9X6 STRL LF SNTH (GAUZE/BANDAGES/DRESSINGS) ×2
BNDG COHESIVE 4X5 TAN STRL (GAUZE/BANDAGES/DRESSINGS) ×3 IMPLANT
BNDG COHESIVE 6X5 TAN STRL LF (GAUZE/BANDAGES/DRESSINGS) ×3 IMPLANT
BNDG ESMARK 4X9 LF (GAUZE/BANDAGES/DRESSINGS) IMPLANT
BNDG ESMARK 6X9 LF (GAUZE/BANDAGES/DRESSINGS) ×3
CHLORAPREP W/TINT 26ML (MISCELLANEOUS) ×3 IMPLANT
COVER BACK TABLE 60X90IN (DRAPES) ×3 IMPLANT
CUFF TOURNIQUET SINGLE 34IN LL (TOURNIQUET CUFF) IMPLANT
CUFF TOURNIQUET SINGLE 44IN (TOURNIQUET CUFF) ×3 IMPLANT
DRAPE C-ARM 42X72 X-RAY (DRAPES) IMPLANT
DRAPE EXTREMITY T 121X128X90 (DRAPE) ×3 IMPLANT
DRAPE OEC MINIVIEW 54X84 (DRAPES) ×3 IMPLANT
DRAPE U-SHAPE 47X51 STRL (DRAPES) ×3 IMPLANT
DRILL CANN 3.5MM (DRILL) ×3
DRSG MEPITEL 4X7.2 (GAUZE/BANDAGES/DRESSINGS) ×3 IMPLANT
DRSG PAD ABDOMINAL 8X10 ST (GAUZE/BANDAGES/DRESSINGS) ×6 IMPLANT
ELECT REM PT RETURN 9FT ADLT (ELECTROSURGICAL) ×3
ELECTRODE REM PT RTRN 9FT ADLT (ELECTROSURGICAL) ×2 IMPLANT
GAUZE SPONGE 4X4 12PLY STRL (GAUZE/BANDAGES/DRESSINGS) ×3 IMPLANT
GLOVE BIO SURGEON STRL SZ8 (GLOVE) ×3 IMPLANT
GLOVE BIOGEL PI IND STRL 7.0 (GLOVE) ×4 IMPLANT
GLOVE BIOGEL PI IND STRL 8 (GLOVE) ×4 IMPLANT
GLOVE BIOGEL PI INDICATOR 7.0 (GLOVE) ×2
GLOVE BIOGEL PI INDICATOR 8 (GLOVE) ×2
GLOVE ECLIPSE 6.5 STRL STRAW (GLOVE) ×3 IMPLANT
GLOVE ECLIPSE 7.5 STRL STRAW (GLOVE) ×3 IMPLANT
GOWN STRL REUS W/ TWL LRG LVL3 (GOWN DISPOSABLE) ×2 IMPLANT
GOWN STRL REUS W/ TWL XL LVL3 (GOWN DISPOSABLE) ×4 IMPLANT
GOWN STRL REUS W/TWL LRG LVL3 (GOWN DISPOSABLE) ×3
GOWN STRL REUS W/TWL XL LVL3 (GOWN DISPOSABLE) ×6
K-WIRE .062X4 (WIRE) IMPLANT
NDL SAFETY ECLIPSE 18X1.5 (NEEDLE) IMPLANT
NEEDLE HYPO 18GX1.5 SHARP (NEEDLE)
NEEDLE HYPO 22GX1.5 SAFETY (NEEDLE) IMPLANT
PACK BASIN DAY SURGERY FS (CUSTOM PROCEDURE TRAY) ×3 IMPLANT
PAD CAST 4YDX4 CTTN HI CHSV (CAST SUPPLIES) ×2 IMPLANT
PADDING CAST ABS 4INX4YD NS (CAST SUPPLIES)
PADDING CAST ABS COTTON 4X4 ST (CAST SUPPLIES) IMPLANT
PADDING CAST COTTON 4X4 STRL (CAST SUPPLIES) ×2
PADDING CAST COTTON 6X4 STRL (CAST SUPPLIES) ×3 IMPLANT
PENCIL BUTTON HOLSTER BLD 10FT (ELECTRODE) ×3 IMPLANT
PIN GUIDE DRILL TIP 2.8X300 (DRILL) ×3 IMPLANT
SANITIZER HAND PURELL 535ML FO (MISCELLANEOUS) ×3 IMPLANT
SCREW 16MM THREAD 6.5X75MM (Screw) ×3 IMPLANT
SCREW CANN 5.0X34MM (Screw) ×3 IMPLANT
SCREW CANNFT 6.5X60 (Screw) ×3 IMPLANT
SCREW CANNULATED 5.0X34 (Screw) ×2 IMPLANT
SCREW CANNULATED 5.0X34MM (Screw) ×3 IMPLANT
SCREW P/T IMPL CANN 5.0X36 (Screw) ×3 IMPLANT
SHEET MEDIUM DRAPE 40X70 STRL (DRAPES) ×6 IMPLANT
SLEEVE SCD COMPRESS KNEE MED (MISCELLANEOUS) ×3 IMPLANT
SPLINT FAST PLASTER 5X30 (CAST SUPPLIES) ×20
SPLINT PLASTER CAST FAST 5X30 (CAST SUPPLIES) ×40 IMPLANT
SPONGE LAP 18X18 X RAY DECT (DISPOSABLE) ×3 IMPLANT
SPONGE SURGIFOAM ABS GEL 12-7 (HEMOSTASIS) IMPLANT
STOCKINETTE 6  STRL (DRAPES) ×1
STOCKINETTE 6 STRL (DRAPES) ×2 IMPLANT
STRIP CLOSURE SKIN 1/2X4 (GAUZE/BANDAGES/DRESSINGS) IMPLANT
SUCTION FRAZIER HANDLE 10FR (MISCELLANEOUS) ×1
SUCTION TUBE FRAZIER 10FR DISP (MISCELLANEOUS) ×2 IMPLANT
SUT ETHILON 3 0 PS 1 (SUTURE) ×9 IMPLANT
SUT MNCRL AB 3-0 PS2 18 (SUTURE) ×6 IMPLANT
SUT VIC AB 0 SH 27 (SUTURE) IMPLANT
SUT VIC AB 2-0 SH 27 (SUTURE) ×3
SUT VIC AB 2-0 SH 27XBRD (SUTURE) ×2 IMPLANT
SYR BULB 3OZ (MISCELLANEOUS) ×3 IMPLANT
TOWEL OR 17X24 6PK STRL BLUE (TOWEL DISPOSABLE) ×6 IMPLANT
TUBE CONNECTING 20X1/4 (TUBING) ×3 IMPLANT
UNDERPAD 30X30 (UNDERPADS AND DIAPERS) ×3 IMPLANT

## 2016-03-15 NOTE — Anesthesia Postprocedure Evaluation (Signed)
Anesthesia Post Note  Patient: Rebekah [name]  Procedure(s) Performed: Procedure(s) (LRB): LEFT GASTROC RECESSION (Left) TALONAVICULAR AND SUBTALAR ARTHRODESIS (Left)  Patient location during evaluation: PACU Anesthesia Type: General and Regional Level of consciousness: awake and alert, patient cooperative and oriented Pain management: pain level controlled Vital Signs Assessment: post-procedure vital signs reviewed and stable Respiratory status: spontaneous breathing, nonlabored ventilation and respiratory function stable Cardiovascular status: blood pressure returned to baseline and stable Postop Assessment: no signs of nausea or vomiting Anesthetic complications: no    Last Vitals:  Vitals:   03/15/16 1200 03/15/16 1244  BP: (!) 162/98 (!) 147/86  Pulse: 85 73  Resp: (!) 30 20  Temp:  36.9 C    Last Pain:  Vitals:   03/15/16 1244  TempSrc: Oral  PainSc: 6                  Hildegard Hlavac,E. Reniya Mcclees

## 2016-03-15 NOTE — H&P (Signed)
Rebekah [name] is an 60 y.o. female.   Chief Complaint:  Left foot pain HPI: 60 y/o female with PMH of a fib (on Xarelto) with chronic left foot pain from a stage 3 PTTD and a tight heelcord.  She has failed non op treatment to date and present today for surgical correction.    Past Medical History:  Diagnosis Date  . Arthritis   . Complication of anesthesia   . Difficulty sleeping    prior sleep study did not reveal sleep apnea per patient  . DJD (degenerative joint disease)   . Dysrhythmia    a-fib  . Environmental allergies   . GERD (gastroesophageal reflux disease)   . Heart murmur   . Hypertension    no meds after weight loss  . Hypothyroidism   . Incontinence of urine    at nite   . Left atrial dilatation   . Obesity    s/p gastric sleeve 12/2013 (previously weighed close to 400 lbs)  . Paroxysmal atrial fibrillation (HCC)   . Pneumonia    hx of   . PONV (postoperative nausea and vomiting)   . Supplemental oxygen dependent    uses 2l/[place] at night, states HR goes low and O2 drops  . Tuberculosis    had 6 month of INH due to exposure     Past Surgical History:  Procedure Laterality Date  . APPENDECTOMY    . CHOLECYSTECTOMY    . EYE SURGERY     03/2014 lens implant left lens   . JOINT REPLACEMENT  1999   L TOTAL KNEE  . LAPAROSCOPIC GASTRIC SLEEVE RESECTION N/A 01/04/2014   Procedure: LAPAROSCOPIC GASTRIC SLEEVE RESECTION;  Surgeon: Greer Pickerel, MD;  Location: [facility];  Service: General;  Laterality: N/A;  . TONSILLECTOMY    . TOTAL KNEE ARTHROPLASTY Right 05/17/2015   Procedure: RIGHT TOTAL KNEE ARTHROPLASTY;  Surgeon: Paralee Cancel, MD;  Location: [facility];  Service: Orthopedics;  Laterality: Right;  . TRANSTHORACIC ECHOCARDIOGRAM  11/2012   EF 60-65%, mod LVH & mod conc hypertrophy, grade 1 diastolic dysfunction; LA mildly dilated; RV systolic pressure increased; PA peak pressure 108mmHg  . TUBAL LIGATION    . UPPER GI ENDOSCOPY  01/04/2014   Procedure: UPPER GI ENDOSCOPY;   Surgeon: Greer Pickerel, MD;  Location: [facility];  Service: General;;    Family History  Problem Relation Age of Onset  . Diabetes Father   . Uterine cancer Mother   . Cervical cancer Mother   . Breast cancer Mother   . Cancer Mother     cervical and brreast cancer  . Diabetes Maternal Grandmother    Social History:  reports that she has never smoked. She has never used smokeless tobacco. She reports that she does not drink alcohol or use drugs.  Allergies:  Allergies  Allergen Reactions  . Mucinex [Guaifenesin Er] Anaphylaxis  . Ace Inhibitors Swelling and Cough    Pedal Edema  . Propoxyphene Hcl Other (See Comments)    REACTION: hallucinate- Darvon  . Hydromet [Hydrocodone-Homatropine] Itching and Other (See Comments)    Severe stomach pain-face itches.   . Sulfonamide Derivatives Rash    Medications Prior to Admission  Medication Sig Dispense Refill  . BIOTIN PO Take 5,000 mcg by mouth daily.     Marland Kitchen CALCIUM PO Take 1,000 mg by mouth daily.    . cetirizine (ZYRTEC) 10 MG tablet Take 10 mg by mouth at bedtime.     Marland Kitchen diltiazem (DILACOR XR)  120 MG 24 hr capsule Take 1 capsule (120 mg total) by mouth daily. 90 capsule 3  . escitalopram (LEXAPRO) 10 MG tablet Take 10 mg by mouth daily.    . furosemide (LASIX) 20 MG tablet Take 1 tablet (20 mg total) by mouth daily as needed for edema. 30 tablet 3  . levothyroxine (SYNTHROID, LEVOTHROID) 50 MCG tablet Take 50 mcg by mouth daily before breakfast.    . Multiple Vitamins-Minerals (MULTIVITAMIN ADULT PO) Take 1 capsule by mouth 2 (two) times daily. Journey 3+3 Bariatric Multivitamin    . OXYGEN Inhale 3 L/min into the lungs at bedtime. Continuously    . potassium chloride SA (K-DUR,KLOR-CON) 20 MEQ tablet Take 1 tablet (20 mEq total) by mouth daily as needed (take when taking lasix). 30 tablet 3  . traMADol (ULTRAM) 50 MG tablet Take by mouth every 6 (six) hours as needed.    Alveda Reasons 20 MG TABS tablet TAKE ONE TABLET BY MOUTH ONCE DAILY  WITH SUPPER 90 tablet 1  . acetaminophen (TYLENOL) 500 MG tablet Take 500-1,000 mg by mouth every 6 (six) hours as needed for moderate pain or headache.    . diltiazem (CARDIZEM) 30 MG tablet Take 1-2 tablets by mouth every 6 hours as needed for fast heart rates 30 tablet 1  . EPINEPHrine (EPIPEN) 0.3 mg/0.3 mL DEVI Inject 0.3 mg into the muscle once as needed (anaphylaxis).     . flecainide (TAMBOCOR) 100 MG tablet Take 3 tablets by mouth at onset of fast heart rates as needed 30 tablet 1    No results found for this or any previous visit (from the past 48 hour(s)). No results found.  ROS  No recent f/c/n/v/wt loss  Blood pressure (!) 147/89, pulse 82, temperature 97.8 F (36.6 C), temperature source Oral, resp. rate 20, height 5\' 2"  (1.575 m), weight 98.4 kg (217 lb), SpO2 100 %. Physical Exam  wn wd woman in nad.  A and O x 4.  Mood and affect normal.  EOMI.  resp unlabored.  L foot with healthy skin.  No lymphadenpathy.  sens to LT intact.  Tight left heelcord.  5/5 strength in PF and DF of the ankle and toes.  Assessment/Plan L stage 3 PTTD and tight heelcord.  To OR for TN and ST fusion and gastroc recession. The risks and benefits of the alternative treatment options have been discussed in detail.  The patient wishes to proceed with surgery and specifically understands risks of bleeding, infection, nerve damage, blood clots, need for additional surgery, amputation and death.   Wylene Simmer, MD 2016/03/31, 9:25 AM

## 2016-03-15 NOTE — Discharge Instructions (Addendum)
Rebekah Simmer, MD [place]  Please read the following information regarding your care after surgery.  Medications  You only need a prescription for the narcotic pain medicine (ex. oxycodone, Percocet, Norco).  All of the other medicines listed below are available over the counter. X acetominophen (Tylenol) 650 mg every 4-6 hours as you need for minor pain X oxycodone as prescribed for moderate to severe pain X aleve 220 mg - 2 tablets twice daily with food as needed for pain.   Narcotic pain medicine (ex. oxycodone, Percocet, Vicodin) will cause constipation.  To prevent this problem, take the following medicines while you are taking any pain medicine. X docusate sodium (Colace) 100 mg twice a day X senna (Senokot) 2 tablets twice a day  X To help prevent blood clots, restart your xarelto on Friday 03/16/16.  You should also get up every hour while you are awake to move around.    Weight Bearing X Do not bear any weight on the operated leg or foot.  Cast / Splint / Dressing X Keep your splint or cast clean and dry.  Dont put anything (coat hanger, pencil, etc) down inside of it.  If it gets damp, use a hair dryer on the cool setting to dry it.  If it gets soaked, call the office to schedule an appointment for a cast change.  After your dressing, cast or splint is removed; you may shower, but do not soak or scrub the wound.  Allow the water to run over it, and then gently pat it dry.  Swelling It is normal for you to have swelling where you had surgery.  To reduce swelling and pain, keep your toes above your nose for at least 3 days after surgery.  It may be necessary to keep your foot or leg elevated for several weeks.  If it hurts, it should be elevated.  Follow Up Call my office at [phone] when you are discharged from the hospital or surgery center to schedule an appointment to be seen two weeks after surgery.  Call my office at [phone] if you develop a fever  >101.5 F, nausea, vomiting, bleeding from the surgical site or severe pain.    Regional Anesthesia Blocks  1. Numbness or the inability to move the "blocked" extremity may last from 3-48 hours after placement. The length of time depends on the medication injected and your individual response to the medication. If the numbness is not going away after 48 hours, call your surgeon.  2. The extremity that is blocked will need to be protected until the numbness is gone and the  Strength has returned. Because you cannot feel it, you will need to take extra care to avoid injury. Because it may be weak, you may have difficulty moving it or using it. You may not know what position it is in without looking at it while the block is in effect.  3. For blocks in the legs and feet, returning to weight bearing and walking needs to be done carefully. You will need to wait until the numbness is entirely gone and the strength has returned. You should be able to move your leg and foot normally before you try and bear weight or walk. You will need someone to be with you when you first try to ensure you do not fall and possibly risk injury.  4. Bruising and tenderness at the needle site are common side effects and will resolve in a few days.  5. Persistent  numbness or new problems with movement should be communicated to the surgeon or the [facility] [phone] [place] [phone]).  Post Anesthesia Home Care Instructions  Activity: Get plenty of rest for the remainder of the day. A responsible adult should stay with you for 24 hours following the procedure.  For the next 24 hours, DO NOT: -Drive a car -Paediatric nurse -Drink alcoholic beverages -Take any medication unless instructed by your physician -Make any legal decisions or sign important papers.  Meals: Start with liquid foods such as gelatin or soup. Progress to regular foods as tolerated. Avoid greasy, spicy, heavy  foods. If nausea and/or vomiting occur, drink only clear liquids until the nausea and/or vomiting subsides. Call your physician if vomiting continues.  Special Instructions/Symptoms: Your throat may feel dry or sore from the anesthesia or the breathing tube placed in your throat during surgery. If this causes discomfort, gargle with warm salt water. The discomfort should disappear within 24 hours.  If you had a scopolamine patch placed behind your ear for the management of post- operative nausea and/or vomiting:  1. The medication in the patch is effective for 72 hours, after which it should be removed.  Wrap patch in a tissue and discard in the trash. Wash hands thoroughly with soap and water. 2. You may remove the patch earlier than 72 hours if you experience unpleasant side effects which may include dry mouth, dizziness or visual disturbances. 3. Avoid touching the patch. Wash your hands with soap and water after contact with the patch.

## 2016-03-15 NOTE — Brief Op Note (Signed)
03/15/2016  11:37 AM  PATIENT:  [name]  60 y.o. female  PRE-OPERATIVE DIAGNOSIS:    1.  Stage 3 left posterior tibial tendon dysfunction      2.  Tight left heelcord   POST-OPERATIVE DIAGNOSIS:  Same  Procedure(s): 1.  LEFT GASTROC RECESSION 2.  Left TALONAVICULAR arthrodesis 3.  Left SUBTALAR ARTHRODESIS 4.  Left foot AP, lateral and Harris heel xrays  SURGEON:  Wylene Simmer, MD  ASSISTANT: Mechele Claude, PA-C  ANESTHESIA:   General, regional  EBL:  minimal   TOURNIQUET:   Total Tourniquet Time Documented: Thigh (Left) - 82 minutes Total: Thigh (Left) - 82 minutes  COMPLICATIONS:  None apparent  DISPOSITION:  Extubated, awake and stable to recovery.  DICTATION ID:  [id number]

## 2016-03-15 NOTE — Progress Notes (Signed)
Assisted Dr. Annye Asa with left, popliteal/saphenous block. Side rails up, monitors on throughout procedure. See vital signs in flow sheet. Tolerated Procedure well.

## 2016-03-15 NOTE — Anesthesia Procedure Notes (Signed)
Anesthesia Regional Block:  Popliteal block  Pre-Anesthetic Checklist: ,, timeout performed, Correct Patient, Correct Site, Correct Laterality, Correct Procedure, Correct Position, site marked, Risks and benefits discussed,  Surgical consent,  Pre-op evaluation,  At surgeon's request and post-op pain management  Laterality: Left and Lower  Prep: chloraprep       Needles:  Injection technique: Single-shot  Needle Type: Stimulator Needle - 80     Needle Length: 9cm 9 cm Needle Gauge: 22 and 22 G    Additional Needles:  Procedures: nerve stimulator Popliteal block  Nerve Stimulator or Paresthesia:  Response: toe dorsiflexion, 0.45 mA, 0.1 ms,  Response: toe abduction, 0.45 mA, 0.1 ms,   Additional Responses:   Narrative:  Start time: 03/15/2016 9:17 AM End time: 03/15/2016 9:32 AM Injection made incrementally with aspirations every 5 mL.  Performed by: Personally  Anesthesiologist: Glennon Mac, Samyak Sackmann  Additional Notes: Pt identified in Holding room.  Monitors applied. Working IV access confirmed. Sterile prep L lateral knee.  #22ga PNS to toe twitches at 0.60mA threshold.  30cc 0.5% Bupivacaine with 1:200k epi injected incrementally after negative test dose. Re-prep prox, ant-med tibia and 15 cc 0.5% Bupivacaine infiltrated for saph nerve supplementation. Patient asymptomatic, VSS, no heme aspirated, tolerated well.  Jenita Seashore, MD

## 2016-03-15 NOTE — Anesthesia Procedure Notes (Signed)
Procedure Name: LMA Insertion Date/Time: 03/15/2016 9:49 AM Performed by: Estellar Cadena D Pre-anesthesia Checklist: Patient identified, Emergency Drugs available, Suction available and Patient being monitored Patient Re-evaluated:Patient Re-evaluated prior to inductionOxygen Delivery Method: Circle system utilized Preoxygenation: Pre-oxygenation with 100% oxygen Intubation Type: IV induction Ventilation: Mask ventilation without difficulty LMA: LMA inserted LMA Size: 4.0 Number of attempts: 1 Airway Equipment and Method: Bite block Placement Confirmation: positive ETCO2 Tube secured with: Tape Dental Injury: Teeth and Oropharynx as per pre-operative assessment

## 2016-03-15 NOTE — Transfer of Care (Signed)
Immediate Anesthesia Transfer of Care Note  Patient: Rebekah [name]  Procedure(s) Performed: Procedure(s): LEFT GASTROC RECESSION (Left) TALONAVICULAR AND SUBTALAR ARTHRODESIS (Left)  Patient Location: PACU  Anesthesia Type:GA combined with regional for post-op pain  Level of Consciousness: awake and patient cooperative  Airway & Oxygen Therapy: Patient Spontanous Breathing and Patient connected to face mask oxygen  Post-op Assessment: Report given to RN and Post -op Vital signs reviewed and stable  Post vital signs: Reviewed and stable  Last Vitals:  Vitals:   03/15/16 0936 03/15/16 0937  BP:    Pulse: 75 78  Resp: 18 20  Temp:      Last Pain:  Vitals:   03/15/16 0857  TempSrc: Oral         Complications: No apparent anesthesia complications

## 2016-03-15 NOTE — Anesthesia Preprocedure Evaluation (Addendum)
Anesthesia Evaluation  Patient identified by MRN, date of birth, ID band Patient awake    Reviewed: Allergy & Precautions, NPO status , Patient's Chart, lab work & pertinent test results  History of Anesthesia Complications (+) PONV and history of anesthetic complications  Airway Mallampati: II  TM Distance: >3 FB Neck ROM: Full    Dental  (+) Dental Advisory Given   Pulmonary COPD,  oxygen dependent,    breath sounds clear to auscultation       Cardiovascular hypertension, + dysrhythmias Atrial Fibrillation  Rhythm:Regular Rate:Normal  '16 ECHO: The left ventricular ejection fraction is normal (55-65%). no ST segment deviation noted during stress. The study is normal. 1/17 ECHO: EF 60-65%, valves OK   Neuro/Psych negative neurological ROS     GI/Hepatic Neg liver ROS, GERD  Controlled,  Endo/Other  Hypothyroidism Morbid obesity  Renal/GU negative Renal ROS     Musculoskeletal  (+) Arthritis ,   Abdominal (+) + obese,   Peds  Hematology negative hematology ROS (+) Xarelto: off since sunday   Anesthesia Other Findings   Reproductive/Obstetrics                             Anesthesia Physical Anesthesia Plan  ASA: III  Anesthesia Plan: General   Post-op Pain Management: GA combined w/ Regional for post-op pain   Induction: Intravenous  Airway Management Planned: LMA  Additional Equipment:   Intra-op Plan:   Post-operative Plan:   Informed Consent: I have reviewed the patients History and Physical, chart, labs and discussed the procedure including the risks, benefits and alternatives for the proposed anesthesia with the patient or authorized representative who has indicated his/her understanding and acceptance.   Dental advisory given  Plan Discussed with: CRNA and Surgeon  Anesthesia Plan Comments: (Plan routine monitors, GA- LMA OK, popliteal block for post op analgesia)        Anesthesia Quick Evaluation

## 2016-03-16 ENCOUNTER — Encounter (HOSPITAL_BASED_OUTPATIENT_CLINIC_OR_DEPARTMENT_OTHER): Payer: Self-pay | Admitting: Orthopedic Surgery

## 2016-03-16 NOTE — Op Note (Signed)
NAME:  Rebekah [name], Rebekah [name] NO.:  [id number]  MEDICAL RECORD NO.:  [id number]  LOCATION:                                 FACILITY:  PHYSICIAN:  Wylene Simmer, MD        DATE OF BIRTH:  1956/03/10  DATE OF PROCEDURE:  03/15/2016 DATE OF DISCHARGE:                              OPERATIVE REPORT   PREOPERATIVE DIAGNOSES: 1. Stage III posterior tibial tendon dysfunction. 2. Tight left heel cord.  POSTOPERATIVE DIAGNOSES: 1. Stage III posterior tibial tendon dysfunction. 2. Tight left heel cord.  PROCEDURE: 1. Left gastrocnemius recession through a separate incision. 2. Left talonavicular joint arthrodesis through a separate incision. 3. Left subtalar joint arthrodesis. 4. Left foot AP, lateral, and Harris heel x-rays.  ASSISTANT:  Mechele Claude, PA-C.  ANESTHESIA:  General, regional.  ESTIMATED BLOOD LOSS:  Minimal.  TOURNIQUET TIME:  80 minutes at 350 mmHg.  COMPLICATIONS:  None apparent.  DISPOSITION:  Extubated, awake, and stable to recovery.  INDICATIONS FOR PROCEDURE:  The patient is a 60 year old woman with past medical history significant for atrial fibrillation.  She has a long history of left foot pain.  She has stage III posterior tibial tendon dysfunction as well as the tight left heel cord.  She has failed nonoperative treatment for this painful condition and presents today for surgical correction.  She understands the risks and benefits of the alternative treatment options and elects surgical treatment.  She specifically understands risks of bleeding, infection, nerve damage, blood clots, need for additional surgery, continued pain, nonunion, amputation, and death.  PROCEDURE IN DETAIL:  After preoperative consent was obtained, the correct operative site was identified, the patient was brought to the operating room and placed supine on the operating table.  General anesthesia was induced.  Preop antibiotics were administered.  Surgical time-out  was taken.  Left lower extremity was prepped and draped in standard sterile fashion with tourniquet around the thigh.  The extremity was exsanguinated and tourniquet was inflated to 250 mmHg.  A longitudinal incision was then made over the medial calf, sharp dissection was carried down through skin subcutaneous tissue.  The superficial fascia was incised.  The gastrocnemius tendon was identified.  The sural nerve was protected.  The tendon was transected from medial to lateral in its entirety.  Wound was irrigated and closed with Monocryl and nylon.  Attention was then turned to the lateral aspect of the hindfoot.  A curvilinear incision was made at the sinus tarsi.  Sharp dissection was carried down through skin and subcutaneous tissue.  Peroneal tendons were protected.  The subtalar joint was opened with a lamina spreader. The remaining articular cartilage on both sides of the joint was removed with curettes and rongeurs.  Wound was irrigated copiously.  A drill bit was used to perforate both sides of the joint leaving the resultant bone graft in place.  Attention was then turned to the talonavicular joint.  A longitudinal incision was made.  Sharp dissection was carried down through the skin and subcutaneous tissue.  The interval between the tibialis anterior and extensor hallucis longus was developed.  Neurovascular bundle was identified.  It  was retracted and protected throughout the case. Talonavicular joint was opened with lamina spreaders.  The remaining articular cartilage and subchondral bone were removed from both sides of the joint.  The wound was irrigated copiously.  The drill bit was again used to perforate both sides of the joint leaving the resultant bone graft in place.  The subtalar joint was then reduced.  A K-wire was inserted from the tuberosity of the heel across the posterior facet and into the dome of the talus.  AP ankle, and lateral foot films  show appropriate position of this guide pin.  It was then over drilled and a 6.5 mm partially threaded cannulated screw from the new [employer] set was inserted and advanced.  It was noted to compress the joint appropriately and had excellent purchase.  The heel wound was then irrigated and closed with nylon.  A second guide wire was then placed from the neck of the talus across the anterior portion of the subtalar joint into the anterior process of the calcaneus.  AP and lateral radiographs confirmed appropriate position of the guide pin.  A guide pin was then over drilled and a fully-threaded 6.5 mm cannulated screw was inserted.  It was noted to have excellent purchase.  The guidewire was removed.  The talonavicular joint was then reduced.  It was provisionally pinned over the lateral half of the joint.  The medial half was then pinned.  AP and lateral radiographs confirmed appropriate reduction of the talonavicular joint. Both guide pins were then over drilled and a 5 mm partially threaded cannulated screws from the [employer] set were inserted.  Both were noted to compress the joint appropriately.  A fully-threaded 5 mm screw was then inserted more centrally between the other 2 compression screws.  AP, lateral, and Harris heel radiographs showed appropriate position length of all hardware and appropriate reduction of the talonavicular and subtalar joints.  Wounds were irrigated.  The retinaculum was closed with Vicryl.  Subcutaneous tissues were approximated with Monocryl and the skin was closed with nylon anteriorly.  Laterally, the peroneal tendon sheath was closed with Vicryl.  Subcutaneous tissues were approximated with Monocryl and skin closed with nylon.  Sterile dressings were applied followed by well-padded short-leg splint. Tourniquet was released after application of the dressings at 82 minutes.  The patient was awakened from anesthesia and transported to the recovery room in  stable condition.  FOLLOWUP PLAN:  The patient will be nonweightbearing on the left lower extremity.  She will be observed overnight for pain control.  She will restart Xarelto tomorrow.  RADIOGRAPHS:  AP foot, lateral foot, and Harris heel radiographs of the left foot were obtained today intraoperatively.  These show interval arthrodesis of the talonavicular and subtalar joints.  Hardware is appropriately positioned of the appropriate lengths.  Mechele Claude, PA-C, was present and scrubbed for the duration of the case.  His assistance was essential in positioning the patient, prepping and draping, gaining and maintaining exposure, performing the operation, closing and dressing the wounds, and applying the splint.     Wylene Simmer, MD   ______________________________ Wylene Simmer, MD    JH/MEDQ  D:  03/15/2016  T:  03/16/2016  Job:  [id number]

## 2016-03-28 ENCOUNTER — Ambulatory Visit (HOSPITAL_COMMUNITY): Payer: BC Managed Care – PPO | Admitting: Nurse Practitioner

## 2016-04-04 ENCOUNTER — Ambulatory Visit (HOSPITAL_COMMUNITY)
Admission: RE | Admit: 2016-04-04 | Discharge: 2016-04-04 | Disposition: A | Discharge status: Home / Self Care | Payer: BC Managed Care – PPO | Source: Ambulatory Visit | Attending: Nurse Practitioner | Admitting: Nurse Practitioner

## 2016-04-04 ENCOUNTER — Encounter (HOSPITAL_COMMUNITY): Payer: Self-pay | Admitting: Nurse Practitioner

## 2016-04-04 VITALS — BP 150/90 | HR 64 | Ht 62.0 in

## 2016-04-04 DIAGNOSIS — Z803 Family history of malignant neoplasm of breast: Secondary | ICD-10-CM | POA: Insufficient documentation

## 2016-04-04 DIAGNOSIS — Z7901 Long term (current) use of anticoagulants: Secondary | ICD-10-CM | POA: Diagnosis not present

## 2016-04-04 DIAGNOSIS — K219 Gastro-esophageal reflux disease without esophagitis: Secondary | ICD-10-CM | POA: Insufficient documentation

## 2016-04-04 DIAGNOSIS — Z96653 Presence of artificial knee joint, bilateral: Secondary | ICD-10-CM | POA: Diagnosis not present

## 2016-04-04 DIAGNOSIS — I480 Paroxysmal atrial fibrillation: Secondary | ICD-10-CM | POA: Diagnosis not present

## 2016-04-04 DIAGNOSIS — I10 Essential (primary) hypertension: Secondary | ICD-10-CM | POA: Insufficient documentation

## 2016-04-04 DIAGNOSIS — Z885 Allergy status to narcotic agent status: Secondary | ICD-10-CM | POA: Diagnosis not present

## 2016-04-04 DIAGNOSIS — Z79899 Other long term (current) drug therapy: Secondary | ICD-10-CM | POA: Diagnosis not present

## 2016-04-04 DIAGNOSIS — Z888 Allergy status to other drugs, medicaments and biological substances status: Secondary | ICD-10-CM | POA: Insufficient documentation

## 2016-04-04 DIAGNOSIS — E039 Hypothyroidism, unspecified: Secondary | ICD-10-CM | POA: Diagnosis not present

## 2016-04-04 DIAGNOSIS — Z833 Family history of diabetes mellitus: Secondary | ICD-10-CM | POA: Diagnosis not present

## 2016-04-04 DIAGNOSIS — M1990 Unspecified osteoarthritis, unspecified site: Secondary | ICD-10-CM | POA: Insufficient documentation

## 2016-04-04 DIAGNOSIS — Z8049 Family history of malignant neoplasm of other genital organs: Secondary | ICD-10-CM | POA: Diagnosis not present

## 2016-04-04 DIAGNOSIS — Z882 Allergy status to sulfonamides status: Secondary | ICD-10-CM | POA: Diagnosis not present

## 2016-04-04 DIAGNOSIS — Z9981 Dependence on supplemental oxygen: Secondary | ICD-10-CM | POA: Diagnosis not present

## 2016-04-04 DIAGNOSIS — Z9884 Bariatric surgery status: Secondary | ICD-10-CM | POA: Diagnosis not present

## 2016-04-04 NOTE — Progress Notes (Addendum)
Patient ID: Rebekah [name], female   DOB: 10/17/1955, 60 y.o.   MRN: [id number]     Primary Care Physician: Terald Sleeper, PA-C Referring Physician: Dr. Jearld Lesch is a 60 y.o. female with a h/o PAF, that is in the afib clinic for f/u.She  is s/p bariatric surgery 9/15 and has lost 200 lbs since surgery. She has PIP flecainide for breakthrough afib but has only had to use it 2x in last year. Most recent epiosde was 2 weeks post op knee replacement. She took short acting cardizem, as she reports high v rates and flecainide and went on for a post op knee visit. Orthopedics office in is same building of Dr. Debara Pickett and she was asked to be seen by cardiology that same day. When seen by Dr. Debara Pickett, she was feeling improved and had converted.She has not had any further issues with afib. She is currently happy with management of afib with PIP flecainide with short acting Cardizem and is on long acting Cardizem daily. She continues on xarelto with a chadsvasc score of 2.  F/u in afib clinic 12/20- she is in [place]. She however had to take pill in pocket flecainide yesterday. She may have to take 2-3 times in one month but then may go several months and not need. She is happy with her current afib management. She will usually convert in 1-2 hours.  Today, she denies symptoms of palpitations, chest pain, shortness of breath, orthopnea, PND, lower extremity edema, dizziness, presyncope, syncope, or neurologic sequela. The patient is tolerating medications without difficulties and is otherwise without complaint today.   Past Medical History:  Diagnosis Date  . Arthritis   . Complication of anesthesia   . Difficulty sleeping    prior sleep study did not reveal sleep apnea per patient  . DJD (degenerative joint disease)   . Dysrhythmia    a-fib  . Environmental allergies   . GERD (gastroesophageal reflux disease)   . Heart murmur   . Hypertension    no meds after weight loss  . Hypothyroidism   .  Incontinence of urine    at nite   . Left atrial dilatation   . Obesity    s/p gastric sleeve 12/2013 (previously weighed close to 400 lbs)  . Paroxysmal atrial fibrillation (HCC)   . Pneumonia    hx of   . PONV (postoperative nausea and vomiting)   . Supplemental oxygen dependent    uses 2l/[place] at night, states HR goes low and O2 drops  . Tuberculosis    had 6 month of INH due to exposure    Past Surgical History:  Procedure Laterality Date  . APPENDECTOMY    . CHOLECYSTECTOMY    . EYE SURGERY     03/2014 lens implant left lens   . FOOT ARTHRODESIS Left 03/15/2016   Procedure: TALONAVICULAR AND SUBTALAR ARTHRODESIS;  Surgeon: Wylene Simmer, MD;  Location: [facility];  Service: Orthopedics;  Laterality: Left;  Marland Kitchen GASTROC RECESSION EXTREMITY Left 03/15/2016   Procedure: LEFT GASTROC RECESSION;  Surgeon: Wylene Simmer, MD;  Location: [facility];  Service: Orthopedics;  Laterality: Left;  . JOINT REPLACEMENT  1999   L TOTAL KNEE  . LAPAROSCOPIC GASTRIC SLEEVE RESECTION N/A 01/04/2014   Procedure: LAPAROSCOPIC GASTRIC SLEEVE RESECTION;  Surgeon: Greer Pickerel, MD;  Location: [facility];  Service: General;  Laterality: N/A;  . TONSILLECTOMY    . TOTAL KNEE ARTHROPLASTY Right 05/17/2015  Procedure: RIGHT TOTAL KNEE ARTHROPLASTY;  Surgeon: Paralee Cancel, MD;  Location: [facility];  Service: Orthopedics;  Laterality: Right;  . TRANSTHORACIC ECHOCARDIOGRAM  11/2012   EF 60-65%, mod LVH & mod conc hypertrophy, grade 1 diastolic dysfunction; LA mildly dilated; RV systolic pressure increased; PA peak pressure 59mmHg  . TUBAL LIGATION    . UPPER GI ENDOSCOPY  01/04/2014   Procedure: UPPER GI ENDOSCOPY;  Surgeon: Greer Pickerel, MD;  Location: [facility];  Service: General;;    Current Outpatient Prescriptions  Medication Sig Dispense Refill  . acetaminophen (TYLENOL) 500 MG tablet Take 500-1,000 mg by mouth every 6 (six) hours as needed for moderate pain or headache.    Marland Kitchen BIOTIN PO Take  5,000 mcg by mouth daily.     Marland Kitchen CALCIUM PO Take 1,000 mg by mouth daily.    . cetirizine (ZYRTEC) 10 MG tablet Take 10 mg by mouth at bedtime.     Marland Kitchen diltiazem (CARDIZEM) 30 MG tablet Take 1-2 tablets by mouth every 6 hours as needed for fast heart rates 30 tablet 1  . diltiazem (DILACOR XR) 120 MG 24 hr capsule Take 1 capsule (120 mg total) by mouth daily. 90 capsule 3  . escitalopram (LEXAPRO) 10 MG tablet Take 10 mg by mouth daily.    . flecainide (TAMBOCOR) 100 MG tablet Take 3 tablets by mouth at onset of fast heart rates as needed 30 tablet 1  . furosemide (LASIX) 20 MG tablet Take 1 tablet (20 mg total) by mouth daily as needed for edema. 30 tablet 3  . levothyroxine (SYNTHROID, LEVOTHROID) 50 MCG tablet Take 50 mcg by mouth daily before breakfast.    . Multiple Vitamins-Minerals (MULTIVITAMIN ADULT PO) Take 1 capsule by mouth 2 (two) times daily. Journey 3+3 Bariatric Multivitamin    . OXYGEN Inhale 3 L/min into the lungs at bedtime. Continuously    . potassium chloride SA (K-DUR,KLOR-CON) 20 MEQ tablet Take 1 tablet (20 mEq total) by mouth daily as needed (take when taking lasix). 30 tablet 3  . XARELTO 20 MG TABS tablet TAKE ONE TABLET BY MOUTH ONCE DAILY WITH SUPPER 90 tablet 1  . docusate sodium (COLACE) 100 MG capsule Take 1 capsule (100 mg total) by mouth 2 (two) times daily. While taking narcotic pain medicine. (Patient not taking: Reported on 04/04/2016) 30 capsule 0  . EPINEPHrine (EPIPEN) 0.3 mg/0.3 mL DEVI Inject 0.3 mg into the muscle once as needed (anaphylaxis).     Marland Kitchen oxyCODONE (ROXICODONE) 5 MG immediate release tablet Take 1-2 tablets (5-10 mg total) by mouth every 4 (four) hours as needed for moderate pain or severe pain. (Patient not taking: Reported on 04/04/2016) 30 tablet 0  . senna (SENOKOT) 8.6 MG TABS tablet Take 2 tablets (17.2 mg total) by mouth 2 (two) times daily. (Patient not taking: Reported on 04/04/2016) 30 each 0   No current facility-administered medications  for this encounter.     Allergies  Allergen Reactions  . Mucinex [Guaifenesin Er] Anaphylaxis  . Ace Inhibitors Swelling and Cough    Pedal Edema  . Propoxyphene Hcl Other (See Comments)    REACTION: hallucinate- Darvon  . Hydromet [Hydrocodone-Homatropine] Itching and Other (See Comments)    Severe stomach pain-face itches.   . Sulfonamide Derivatives Rash    Social History   Social History  . Marital status: Married    Spouse name: N/A  . Number of children: 2  . Years of education: N/A   Occupational History  . special  education teacher Other    [place]  . retired Marine scientist - L&D @ [employer] Topics  . Smoking status: Never Smoker  . Smokeless tobacco: Never Used  . Alcohol use No  . Drug use: No  . Sexual activity: Not on file   Other Topics Concern  . 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 Diabetes Father   . Uterine cancer Mother   . Cervical cancer Mother   . Breast cancer Mother   . Cancer Mother     cervical and brreast cancer  . Diabetes Maternal Grandmother     ROS- All systems are reviewed and negative except as per the HPI above  Physical Exam: Vitals:   04/04/16 1112  BP: (!) 150/90  Pulse: 64  Height: 5\' 2"  (1.575 m)    GEN- The patient is well appearing, alert and oriented x 3 today.   Head- normocephalic, atraumatic Eyes-  Sclera clear, conjunctiva pink Ears- hearing intact Oropharynx- clear Neck- supple, no JVP Lymph- no cervical lymphadenopathy Lungs- Clear to ausculation bilaterally, normal work of breathing Heart- Regular rate and rhythm, no murmurs, rubs or gallops, PMI not laterally displaced GI- soft, NT, ND, + BS Extremities- no clubbing, cyanosis, or edema MS- no significant deformity or atrophy Skin- no rash or lesion Psych- euthymic  mood, full affect Neuro- strength and sensation are intact  EKG-NSR at 64 bpm, pr int 148 ms, qrs int 96 ms, qtc 425 ms Epic records reviewed  Assessment and Plan: 1. PAF Currently happy to treat afib episodes with PIP approach with flecainide, not ready for daily flecainide  Currently low afib burden Continue diltiazem, long acting and short acting as needed Continue xarelto 20 mg daily  2. Morbid obesity Improved after 200 lb  weight loss with bariatric surgery Continue weight loss efforts thru close follow up with bariatric support group  3. HTN stable   afib clinic as needed Dr. Debara Pickett as scheduled   Geroge Baseman. [place], [facility] [address] [place], [place] [address] [phone]

## 2016-04-05 ENCOUNTER — Encounter: Payer: Self-pay | Admitting: Dietician

## 2016-04-05 ENCOUNTER — Encounter: Payer: BC Managed Care – PPO | Attending: General Surgery | Admitting: Dietician

## 2016-04-05 DIAGNOSIS — Z9884 Bariatric surgery status: Secondary | ICD-10-CM | POA: Insufficient documentation

## 2016-04-05 DIAGNOSIS — Z713 Dietary counseling and surveillance: Secondary | ICD-10-CM | POA: Diagnosis not present

## 2016-04-05 NOTE — Patient Instructions (Signed)
-  Increase weight bearing activity (PT gym)  -Increase calorie intake  -Try having a solid breakfast (1/4 cup steel cut oatmeal) and a shake midmorning if you need it    -3 solid meals

## 2016-04-05 NOTE — Progress Notes (Signed)
  Follow-up visit:  2 years Post-Operative Sleeve gastrectomy Surgery  Medical Nutrition Therapy:  Appt start time: 1100 end time:  1200  Primary concerns today: Post-operative Bariatric Surgery Nutrition Management.  Rebekah [name] returns today for a bariatric nutrition follow up. Unable to be weighed due to recent foot surgery; wheelchair/walker bound. Will need physical therapy.    TANITA  BODY COMP RESULTS  11/30/13 01/19/14 03/04/14 04/22/14 12/27/15 02/08/16 04/05/16   BMI (kg/m^2) 67.5 60.4 55.2 52 37.8 37.3 n/a   Fat Mass (lbs) 196 160.0 142.5 131.5 74.8 77.2    Fat Free Mass (lbs) 161.5 170.5 159.5 153 131.8 126.8    Total Body Water (lbs) 118 125.0 117 112 94.2 90.6     Preferred Learning Style:  No preference indicated   Learning Readiness:  Ready  Change in progress  24-hr recall: B (6:30-8 AM): Premier protein shake Snk (AM): none   L (11:30-12:30 PM): leftover meat and vegetable OR cottage cheese with apple slices, sometimes a few peanuts (less than an ounce) Snk (PM):   D (PM): 3-4 oz chicken or steak, vegetables, sometimes fruit  Snk (PM): bariatric-friendly cheesecake  Fluid intake: 80-90 oz per day of decaf tea with sweetener (does not like plain water) Estimated total protein intake: 60+ grams per day  Medications: see list Supplementation: yes  Recent physical activity:  Pickleball, walking, and playing with grandson  Progress Towards Goal(s):  In progress.  Handouts given during visit include:  none   Nutritional Diagnosis:  [place]-3.4 Unintentional weight gain As related to 2 years post op bariatric surgery and patient recently went out of town and increased energy intake.  As evidenced by patient reports recent weight gain of 13 pounds. .    Intervention:  Nutrition counseling provided. Congratulated patient on her weight loss and praised her for sticking with her healthy habits. Encouraged her to continue to eat protein foods first, then vegetables.  Recommended increased physical activity as tolerated. Encouraged the patient to be kind to herself and focus on non scale victories.   Teaching Method Utilized:  Visual Auditory Hands on  Barriers to learning/adherence to lifestyle change: joint pain  Demonstrated degree of understanding via:  Teach Back   Monitoring/Evaluation:  Dietary intake, exercise, and body weight. Patient to call to follow up before the end of the year.

## 2016-04-13 ENCOUNTER — Telehealth: Payer: Self-pay | Admitting: Physician Assistant

## 2016-04-13 NOTE — Telephone Encounter (Signed)
Pt given appt with Glenard Haring Jan 5th at 3:40

## 2016-04-20 ENCOUNTER — Encounter: Payer: Self-pay | Admitting: Physician Assistant

## 2016-04-20 ENCOUNTER — Ambulatory Visit (INDEPENDENT_AMBULATORY_CARE_PROVIDER_SITE_OTHER): Payer: BC Managed Care – PPO | Admitting: Physician Assistant

## 2016-04-20 ENCOUNTER — Ambulatory Visit (INDEPENDENT_AMBULATORY_CARE_PROVIDER_SITE_OTHER): Payer: BC Managed Care – PPO

## 2016-04-20 VITALS — BP 152/92 | HR 68 | Temp 97.7°F | Ht 62.0 in | Wt 204.0 lb

## 2016-04-20 DIAGNOSIS — S6992XA Unspecified injury of left wrist, hand and finger(s), initial encounter: Secondary | ICD-10-CM | POA: Diagnosis not present

## 2016-04-20 MED ORDER — TRAMADOL HCL 50 MG PO TABS: 50.0000 mg | ORAL_TABLET | Freq: Four times a day (QID) | ORAL | 5 refills | Status: DC | PRN

## 2016-04-20 NOTE — Patient Instructions (Signed)
Wrist Sprain, Adult A wrist sprain is a stretch or tear in the strong, fibrous tissues (ligaments) that connect your wrist bones. There are three types of wrist sprains:  Grade 1. In this type of sprain, the ligament is stretched more than normal.  Grade 2. In this type of sprain, the ligament is partially torn. You may be able to move your wrist, but not very much.  Grade 3. In this type of sprain, the ligament or muscle is completely torn. You may find it difficult or extremely painful to move your wrist even a little.  What are the causes? A wrist sprain can be caused by using the wrist too much during sports, exercise, or at work. It can also happen with a fall or during an accident. What increases the risk? This condition is more likely to occur in people:  With a previous wrist or arm injury.  With poor wrist strength and flexibility.  Who play contact sports, such as football or soccer.  Who play sports that may result in a fall, such as skateboarding, biking, skiing, or snowboarding.  Who do not exercise regularly.  Who use exercise equipment that does not fit well.  What are the signs or symptoms? Symptoms of this condition include:  Pain in the wrist, arm, or hand.  Swelling or bruised skin near the wrist, hand, or arm. The skin may look yellow or kind of blue.  Stiffness or trouble moving the hand.  Hearing a pop or feeling a tear at the time of the injury.  A warm feeling in the skin around the wrist.  How is this diagnosed? This condition is diagnosed with a physical exam. Sometimes an X-ray is taken to make sure a bone did not break. If your health care provider thinks that you tore a ligament, he or she may order an MRI of your wrist. How is this treated? This condition is treated by resting and applying ice to your wrist. Additional treatment may include:  Medicine for pain and inflammation.  A splint to keep your wrist still (immobilized).  Exercises  to strengthen and stretch your wrist.  Surgery. This may be done if the ligament is completely torn.  Follow these instructions at home: If you have a splint:   Do not put pressure on any part of the splint until it is fully hardened. This may take several hours.  Wear the splint as told by your health care provider. Remove it only as told by your health care provider.  Loosen the splint if your fingers tingle, become numb, or turn cold and blue.  If your splint is not waterproof: ? Do not let it get wet. ? Cover it with a watertight covering when you take a bath or a shower.  Keep the splint clean. Managing pain, stiffness, and swelling   If directed, put ice on the injured area. ? If you have a removable splint, remove it as told by your health care provider. ? Put ice in a plastic bag. ? Place a towel between your skin and the bag or between the splint and the bag. ? Leave the ice on for 20 minutes, 2-3 times per day.  Move your fingers often to avoid stiffness and to lessen swelling.  Raise (elevate) the injured area above the level of your heart while you are sitting or lying down. Activity  Rest your wrist. Do not do things that cause pain.  Return to your normal activities as told by   your health care provider. Ask your health care provider what activities are safe for you.  Do exercises as told by your health care provider. General instructions  Take over-the-counter and prescription medicines only as told by your health care provider.  Do not use any products that contain nicotine or tobacco, such as cigarettes and e-cigarettes. These can delay healing. If you need help quitting, ask your health care provider.  Ask your health care provider when it is safe to drive if you have a splint.  Keep all follow-up visits as told by your health care provider. This is important. Contact a health care provider if:  Your pain, bruising, or swelling gets worse.  Your  skin becomes red, gets a rash, or has open sores.  Your pain does not get better or it gets worse. Get help right away if:  You have a new or sudden sharp pain in the hand, arm, or wrist.  You have tingling or numbness in your hand.  Your fingers turn white, very red, or cold and blue.  You cannot move your fingers. This information is not intended to replace advice given to you by your health care provider. Make sure you discuss any questions you have with your health care provider. Document Released: 12/04/2013 Document Revised: 10/29/2015 Document Reviewed: 10/20/2015 Elsevier Interactive Patient Education  2017 Elsevier Inc.  

## 2016-04-22 ENCOUNTER — Encounter: Payer: Self-pay | Admitting: Physician Assistant

## 2016-04-22 NOTE — Progress Notes (Signed)
BP (!) 152/92   Pulse 68   Temp 97.7 F (36.5 C) (Oral)   Ht 5\' 2"  (1.575 m)   Wt 204 lb (92.5 kg)   BMI 37.31 kg/m    Subjective:    Patient ID: Rebekah [name], female    DOB: 1955/08/21, 61 y.o.   MRN: [id number]  HPI: Rebekah [name] is a 61 y.o. female presenting on 04/20/2016 for Wrist Pain (hurt wrist 6 weeks ago- left )  Pain in the left wrist with movement and lifting. Hurt about 4 weeks ago but has had to be off feet because of foot surgery.  No severe deformity. Otherwise doing well.   Past Medical History:  Diagnosis Date  . Arthritis   . Complication of anesthesia   . Difficulty sleeping    prior sleep study did not reveal sleep apnea per patient  . DJD (degenerative joint disease)   . Dysrhythmia    a-fib  . Environmental allergies   . GERD (gastroesophageal reflux disease)   . Heart murmur   . Hypertension    no meds after weight loss  . Hypothyroidism   . Incontinence of urine    at nite   . Left atrial dilatation   . Obesity    s/p gastric sleeve 12/2013 (previously weighed close to 400 lbs)  . Paroxysmal atrial fibrillation (HCC)   . Pneumonia    hx of   . PONV (postoperative nausea and vomiting)   . Supplemental oxygen dependent    uses 2l/[place] at night, states HR goes low and O2 drops  . Tuberculosis    had 6 month of INH due to exposure    Relevant past medical, surgical, family and social history reviewed and updated as indicated. Interim medical history since our last visit reviewed. Allergies and medications reviewed and updated. DATA REVIEWED: CHART IN EPIC  Social History   Social History  . Marital status: Married    Spouse name: N/A  . Number of children: 2  . Years of education: N/A   Occupational History  . special education teacher Other    [place]  . retired Marine scientist - L&D @ [employer]  . Smoking status: Never Smoker  . Smokeless tobacco: Never Used  . Alcohol use No  . Drug use: No    . Sexual activity: Not on file   Other Topics Concern  . 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 APPENDECTOMY    . CHOLECYSTECTOMY    . EYE SURGERY     03/2014 lens implant left lens   . FOOT ARTHRODESIS Left 03/15/2016   Procedure: TALONAVICULAR AND SUBTALAR ARTHRODESIS;  Surgeon: Wylene Simmer, MD;  Location: [facility];  Service: Orthopedics;  Laterality: Left;  Marland Kitchen GASTROC RECESSION EXTREMITY Left 03/15/2016   Procedure: LEFT GASTROC RECESSION;  Surgeon: Wylene Simmer, MD;  Location: [facility];  Service: Orthopedics;  Laterality: Left;  . JOINT REPLACEMENT  1999   L TOTAL KNEE  . LAPAROSCOPIC GASTRIC SLEEVE RESECTION N/A 01/04/2014   Procedure: LAPAROSCOPIC GASTRIC SLEEVE RESECTION;  Surgeon: Greer Pickerel, MD;  [facility];  Service: General;  Laterality: N/A;  . TONSILLECTOMY    . TOTAL KNEE ARTHROPLASTY Right 05/17/2015   Procedure: RIGHT  TOTAL KNEE ARTHROPLASTY;  Surgeon: Paralee Cancel, MD;  [facility];  Service: Orthopedics;  Laterality: Right;  . TRANSTHORACIC ECHOCARDIOGRAM  11/2012   EF 60-65%, mod LVH & mod conc hypertrophy, grade 1 diastolic dysfunction; LA mildly dilated; RV systolic pressure increased; PA peak pressure 70mmHg  . TUBAL LIGATION    . UPPER GI ENDOSCOPY  01/04/2014   Procedure: UPPER GI ENDOSCOPY;  Surgeon: Greer Pickerel, MD;  [facility];  Service: General;;    Family History  Problem Relation Age of Onset  . Diabetes Father   . Uterine cancer Mother   . Cervical cancer Mother   . Breast cancer Mother   . Cancer Mother     cervical and brreast cancer  . Diabetes Maternal Grandmother     Review of Systems  Constitutional: Negative.  Negative for activity change, fatigue and fever.  HENT: Negative.   Eyes: Negative.   Respiratory: Negative.  Negative for  cough.   Cardiovascular: Negative.  Negative for chest pain.  Gastrointestinal: Negative.  Negative for abdominal pain.  Endocrine: Negative.   Genitourinary: Negative.  Negative for dysuria.  Musculoskeletal: Negative.   Skin: Negative.   Neurological: Negative.     Allergies as of 04/20/2016      Reactions   Mucinex [guaifenesin Er] Anaphylaxis   Ace Inhibitors Swelling, Cough   Pedal Edema   Propoxyphene Hcl Other (See Comments)   REACTION: hallucinate- Darvon   Hydromet [hydrocodone-homatropine] Itching, Other (See Comments)   Severe stomach pain-face itches.    Sulfonamide Derivatives Rash      Medication List       Accurate as of 04/20/16 11:59 PM. Always use your most recent med list.          acetaminophen 500 MG tablet Commonly known as:  TYLENOL Take 500-1,000 mg by mouth every 6 (six) hours as needed for moderate pain or headache.   BIOTIN PO Take 5,000 mcg by mouth daily.   CALCIUM PO Take 1,000 mg by mouth daily.   cetirizine 10 MG tablet Commonly known as:  ZYRTEC Take 10 mg by mouth at bedtime.   diltiazem 120 MG 24 hr capsule Commonly known as:  DILACOR XR Take 1 capsule (120 mg total) by mouth daily.   diltiazem 30 MG tablet Commonly known as:  CARDIZEM Take 1-2 tablets by mouth every 6 hours as needed for fast heart rates   EPIPEN 0.3 mg/0.3 mL Soaj injection Generic drug:  EPINEPHrine Inject 0.3 mg into the muscle once as needed (anaphylaxis).   escitalopram 10 MG tablet Commonly known as:  LEXAPRO Take 10 mg by mouth daily.   flecainide 100 MG tablet Commonly known as:  TAMBOCOR Take 3 tablets by mouth at onset of fast heart rates as needed   furosemide 20 MG tablet Commonly known as:  LASIX Take 1 tablet (20 mg total) by mouth daily as needed for edema.   levothyroxine 50 MCG tablet Commonly known as:  SYNTHROID, LEVOTHROID Take 50 mcg by mouth daily before breakfast.   MULTIVITAMIN ADULT PO Take 1 capsule by mouth 2 (two)  times daily. Journey 3+3 Bariatric Multivitamin   OXYGEN Inhale 3 L/min into the lungs at bedtime. Continuously   potassium chloride SA 20 MEQ tablet Commonly known as:  K-DUR,KLOR-CON Take 1 tablet (20 mEq total) by mouth daily as needed (take when taking lasix).   traMADol 50 MG tablet Commonly known as:  ULTRAM Take 1 tablet (50 mg total) by mouth every 6 (six)  hours as needed.   XARELTO 20 MG Tabs tablet Generic drug:  rivaroxaban TAKE ONE TABLET BY MOUTH ONCE DAILY WITH SUPPER          Objective:    BP (!) 152/92   Pulse 68   Temp 97.7 F (36.5 C) (Oral)   Ht 5\' 2"  (1.575 m)   Wt 204 lb (92.5 kg)   BMI 37.31 kg/m   Allergies  Allergen Reactions  . Mucinex [Guaifenesin Er] Anaphylaxis  . Ace Inhibitors Swelling and Cough    Pedal Edema  . Propoxyphene Hcl Other (See Comments)    REACTION: hallucinate- Darvon  . Hydromet [Hydrocodone-Homatropine] Itching and Other (See Comments)    Severe stomach pain-face itches.   . Sulfonamide Derivatives Rash    Wt Readings from Last 3 Encounters:  04/20/16 204 lb (92.5 kg)  03/15/16 217 lb (98.4 kg)  02/08/16 204 lb (92.5 kg)    Physical Exam  Constitutional: She is oriented to person, place, and time. She appears well-developed and well-nourished.  HENT:  Head: Normocephalic and atraumatic.  Eyes: Conjunctivae and EOM are normal. Pupils are equal, round, and reactive to light.  Cardiovascular: Normal rate, regular rhythm, normal heart sounds and intact distal pulses.   Pulmonary/Chest: Effort normal and breath sounds normal.  Abdominal: Soft. Bowel sounds are normal.  Musculoskeletal:       Left wrist: She exhibits decreased range of motion, tenderness and swelling. She exhibits no deformity.  Neurological: She is alert and oriented to person, place, and time. She has normal reflexes.  Skin: Skin is warm and dry. No rash noted.  Psychiatric: She has a normal mood and affect. Her behavior is normal. Judgment and  thought content normal.        Assessment & Plan:   1. Injury of left wrist, initial encounter - DG Wrist Complete Left; Future Wrap, rest, call if not better in 2 weeks. Tramadol 50 mg 1 BID  Continue all other maintenance medications as listed above.  Follow up plan: Follow-up as needed or worsening of symptoms. Call office for any issues.   Orders Placed This Encounter  Procedures  . DG Wrist Complete Left    Educational handout given for wrist sprain  Terald Sleeper PA-C [facility] [address]  [place], [place] [address] [phone]   04/22/2016, 6:59 PM

## 2016-04-26 ENCOUNTER — Other Ambulatory Visit: Payer: Self-pay | Admitting: Cardiology

## 2016-04-27 NOTE — Telephone Encounter (Signed)
Rx(s) sent to pharmacy electronically.  

## 2016-05-01 ENCOUNTER — Ambulatory Visit: Payer: BC Managed Care – PPO | Attending: Orthopedic Surgery | Admitting: Physical Therapy

## 2016-05-01 ENCOUNTER — Encounter: Payer: Self-pay | Admitting: Physical Therapy

## 2016-05-01 DIAGNOSIS — M25672 Stiffness of left ankle, not elsewhere classified: Secondary | ICD-10-CM | POA: Insufficient documentation

## 2016-05-01 DIAGNOSIS — R600 Localized edema: Secondary | ICD-10-CM | POA: Insufficient documentation

## 2016-05-01 DIAGNOSIS — R2689 Other abnormalities of gait and mobility: Secondary | ICD-10-CM | POA: Insufficient documentation

## 2016-05-01 NOTE — Patient Instructions (Signed)
Gastroc / Heel Cord Stretch - Seated With Towel   Sit on floor, towel around ball of foot. Gently pull foot in toward body, stretching heel cord and calf. Hold for _30__ seconds. Repeat _3__ times. Do 3-4___ times per day.   Ankle Alphabet   Using left ankle and foot only, trace the letters of the alphabet. Perform A to Z. Repeat _1___ times per set. Do ____ sets per session. Do __2-3__ sessions per day.  http://orth.exer.us/16   Copyright  VHI. All rights reserved.    Ankle Circles   Slowly rotate right foot and ankle clockwise then counterclockwise. Gradually increase range of motion. Avoid pain. Circle __10__ times each direction per set. Do ____ sets per session. Do 2-3____ sessions per day.  http://orth.exer.us/30   Copyright  VHI. All rights reserved.    ROM: Plantar / Dorsiflexion   With left leg relaxed, gently flex and extend ankle. Move through full range of motion. Avoid pain. Repeat __20__ times per set. Do ____ sets per session. Do __3-4__ sessions per day.  http://orth.exer.us/34   Copyright  VHI. All rights reserved.    ROM: Inversion / Eversion   With left leg relaxed, gently turn ankle and foot in and out. Move through full range of motion. Avoid pain. Repeat _20___ times per set. Do ____ sets per session. Do _3-4___ sessions per day.  http://orth.exer.us/36   Copyright  VHI. All rights reserved.   Toe Curl: Bilateral   With both feet resting on towel, slowly bunch up towel by curling toes. Hold ____ seconds. Repeat _10-20___ times per set. Do ____ sets per session. Do _2___ sessions per day.  http://orth.exer.us/20   Rebekah [name], PT 05/01/16 [facility] [place], [place], [address] Phone: [phone]   Fax:  [phone]

## 2016-05-01 NOTE — Therapy (Signed)
[facility] [place], [place], [address] Phone: [phone]   Fax:  [phone]  Physical Therapy Evaluation  Patient Details  Name: Rebekah [name] MRN: [id number] Date of Birth: 01-23-1956 Referring Provider: Mechele Claude, PA-C  Encounter Date: 05/01/2016      PT End of Session - 05/01/16 1036    Visit Number 1   Number of Visits 12   Date for PT Re-Evaluation 06/12/16   PT Start Time [id number]   PT Stop Time 1126   PT Time Calculation (min) 51 min   Activity Tolerance Patient tolerated treatment well   Behavior During Therapy [facility] for tasks assessed/performed      Past Medical History:  Diagnosis Date  . Arthritis   . Complication of anesthesia   . Difficulty sleeping    prior sleep study did not reveal sleep apnea per patient  . DJD (degenerative joint disease)   . Dysrhythmia    a-fib  . Environmental allergies   . GERD (gastroesophageal reflux disease)   . Heart murmur   . Hypertension    no meds after weight loss  . Hypothyroidism   . Incontinence of urine    at nite   . Left atrial dilatation   . Obesity    s/p gastric sleeve 12/2013 (previously weighed close to 400 lbs)  . Paroxysmal atrial fibrillation (HCC)   . Pneumonia    hx of   . PONV (postoperative nausea and vomiting)   . Supplemental oxygen dependent    uses 2l/[place] at night, states HR goes low and O2 drops  . Tuberculosis    had 6 month of INH due to exposure     Past Surgical History:  Procedure Laterality Date  . APPENDECTOMY    . CHOLECYSTECTOMY    . EYE SURGERY     03/2014 lens implant left lens   . FOOT ARTHRODESIS Left 03/15/2016   Procedure: TALONAVICULAR AND SUBTALAR ARTHRODESIS;  Surgeon: Wylene Simmer, MD;  Location: [facility];  Service: Orthopedics;  Laterality: Left;  Marland Kitchen GASTROC RECESSION EXTREMITY Left 03/15/2016   Procedure: LEFT GASTROC RECESSION;  Surgeon: Wylene Simmer, MD;  Location: [facility];   Service: Orthopedics;  Laterality: Left;  . JOINT REPLACEMENT  1999   L TOTAL KNEE  . LAPAROSCOPIC GASTRIC SLEEVE RESECTION N/A 01/04/2014   Procedure: LAPAROSCOPIC GASTRIC SLEEVE RESECTION;  Surgeon: Greer Pickerel, MD;  [facility];  Service: General;  Laterality: N/A;  . TONSILLECTOMY    . TOTAL KNEE ARTHROPLASTY Right 05/17/2015   Procedure: RIGHT TOTAL KNEE ARTHROPLASTY;  Surgeon: Paralee Cancel, MD;  [facility];  Service: Orthopedics;  Laterality: Right;  . TRANSTHORACIC ECHOCARDIOGRAM  11/2012   EF 60-65%, mod LVH & mod conc hypertrophy, grade 1 diastolic dysfunction; LA mildly dilated; RV systolic pressure increased; PA peak pressure 50mmHg  . TUBAL LIGATION    . UPPER GI ENDOSCOPY  01/04/2014   Procedure: UPPER GI ENDOSCOPY;  Surgeon: Greer Pickerel, MD;  [facility];  Service: General;;    There were no vitals filed for this visit.       Subjective Assessment - 05/01/16 1026    Subjective Patient had surgery on her L foot 03/15/16. She presents today with rollator walker and with her L foot in a CAM boot which she started wearing 04/25/16. She also has a splint on her L wrist.  She has only been walking short distances in the house and to car. She reports  soreness in the inner ankle. She was a significant pronator which is why she had the surgery.   Pertinent History L foot arthrodesis (talonavicular and subtalar); L gastro recession; R TKA (1/17), afib, HTN; L wrist pain   How long can you walk comfortably? 2 steps but walks short distances   Patient Stated Goals decrease pain, improve strength/RoM   Currently in Pain? Yes   Pain Score 2    Pain Location Foot   Pain Orientation Left   Pain Descriptors / Indicators Throbbing;Pressure   Pain Type Surgical pain   Pain Onset More than a month ago   Pain Frequency Intermittent   Aggravating Factors  walking   Pain Relieving Factors rest, elevation   Effect of Pain on Daily Activities limited with walking             [facility] PT Assessment - 05/01/16 0001      Assessment   Medical Diagnosis L Post tibial tendon dysfunction   Referring Provider Mechele Claude, PA-C   Onset Date/Surgical Date 03/15/16   Next MD Visit 05/26/16     Precautions   Precaution Comments CAM boot or AD with ambulation   Required Braces or Orthoses Other Brace/Splint   Other Brace/Splint CAM boot     Restrictions   Other Position/Activity Restrictions WBAT     Balance Screen   Has the patient fallen in the past 6 months Yes   How many times? 4-6  not lack of balance per pt; uneven surfaces   Has the patient had a decrease in activity level because of a fear of falling?  No   Is the patient reluctant to leave their home because of a fear of falling?  No     Home Environment   Living Environment Private residence   Type of [place] Access Stairs to enter   Entrance Stairs-Number of Steps 1  one 4 inch step no railing   Home Layout One level   [place] - 4 wheels;Cane - single point     Prior Function   Level of Independence Independent with household mobility with device   Vocation Retired     Observation/Other Assessments-Edema    Edema Figure 8     Figure 8 Edema   Figure 8 - Right  58 cm   Figure 8 - Left  63 cm     ROM / Strength   AROM / PROM / Strength AROM;PROM;Strength     AROM   AROM Assessment Site Ankle   Right/Left Ankle Left   Left Ankle Dorsiflexion 4   Left Ankle Plantar Flexion 31   Left Ankle Inversion 5   Left Ankle Eversion 14     PROM   PROM Assessment Site Ankle   Right/Left Ankle Left   Left Ankle Dorsiflexion 5   Left Ankle Plantar Flexion 42     Strength   Overall Strength Comments B hip flex 4-/5, ABD in sitting 5/5,    Strength Assessment Site Ankle   Right/Left Ankle Left                           PT Education - 05/01/16 1123    Education provided Yes   Education Details HEP   Person(s) Educated Patient   Methods  Explanation;Demonstration;Handout   Comprehension Verbalized understanding;Returned demonstration;Verbal cues required          PT Short Term Goals - 05/01/16  [place] #1   Title Patient independent with initial HEP. (05/29/16)   Time 4   Period Weeks   Status New     PT SHORT TERM GOAL #2   Title Patient able to ambulate safely with SPC in the home. (05/29/16)   Time 4   Period Weeks   Status New           PT Long Term Goals - 05/01/16 1225      PT LONG TERM GOAL #1   Title Ind with advanced HEP.   Time 8   Period Weeks   Status New     PT LONG TERM GOAL #2   Title Decrease edema in L foot/ankle  to within 3 cms of contralateral side to assist with range of motion gains and decrease pain.   Time 8   Period Weeks   Status New     PT LONG TERM GOAL #3   Title Patient able to amb safely 300 feet in clinic with least restrictive AD and normal heel strike.   Time 8   Period Weeks   Status New     PT LONG TERM GOAL #4   Title Patient able to ambulate with pain no greater than 1/10 for community distances.   Time 8   Period Weeks   Status New     PT LONG TERM GOAL #5   Title Patient to be able to demonstrate good balance with activities on noncompliant surfaces to prevent falls.   Time 8   Period Weeks   Status New               Plan - 05/01/16 1130    Clinical Impression Statement Patient presents s/p Left foot arthrodesis. She amb in a CAM boot and with a rollator walker with abnormal gait pattern due to boot. She has pain and decreased L ankle ROM and strength limiting standing and walking.    Rehab Potential Excellent   PT Frequency 2x / week  1x per week until boot off   PT Duration 8 weeks   PT Treatment/Interventions ADLs/Self Care Home Management;Electrical Stimulation;Cryotherapy;Ultrasound;Gait training;Stair training;Functional mobility training;Therapeutic activities;Therapeutic exercise;Neuromuscular  re-education;Balance training;Patient/family education;Passive range of motion;Scar mobilization;Manual techniques;Vasopneumatic Device   PT Next Visit Plan work on gait with SPC in boot, ankle ROM, strength NWB for ankle/knee/hip. modalities for pain/edema.   PT Home Exercise Plan ankle ROM, toe scrunch, towel stretch for DF   Consulted and Agree with Plan of Care Patient      Patient will benefit from skilled therapeutic intervention in order to improve the following deficits and impairments:  Abnormal gait, Decreased range of motion, Pain, Decreased strength, Increased edema  Visit Diagnosis: Stiffness of left ankle, not elsewhere classified - Plan: PT plan of care cert/re-cert  Other abnormalities of gait and mobility - Plan: PT plan of care cert/re-cert  Localized edema - Plan: PT plan of care cert/re-cert     Problem List Patient Active Problem List   Diagnosis Date Noted  . Obese 05/20/2015  . S/P right TKA 05/17/2015  . S/P knee replacement 05/17/2015  . Paroxysmal a-fib ([place]) 07/20/2014  . Prediabetes 01/06/2014  . Dyslipidemia 01/06/2014  . OA (osteoarthritis) 01/06/2014  . S/P laparoscopic sleeve gastrectomy 01/04/14 01/06/2014  . Postoperative atrial fibrillation - resolved 01/06/2014  . Morbid obesity ([place]) 01/04/2014  . Morbid obesity with BMI of 60.0-69.9, adult ([place]) 10/28/2013  . Dyspnea on exertion 09/05/2012  .  Essential hypertension 04/21/2007  . Hermina Staggers 04/21/2007    Madelyn Flavors PT 05/01/2016, 12:34 PM  [facility] [place], [place], [address] Phone: [phone]   Fax:  [phone]  Name: Rebekah [name] MRN: [id number] Date of Birth: 1955/05/02

## 2016-05-08 ENCOUNTER — Ambulatory Visit: Payer: BC Managed Care – PPO | Admitting: *Deleted

## 2016-05-08 DIAGNOSIS — R600 Localized edema: Secondary | ICD-10-CM

## 2016-05-08 DIAGNOSIS — R2689 Other abnormalities of gait and mobility: Secondary | ICD-10-CM

## 2016-05-08 DIAGNOSIS — M25672 Stiffness of left ankle, not elsewhere classified: Secondary | ICD-10-CM

## 2016-05-08 NOTE — Therapy (Signed)
[facility] [place], [place], [address] Phone: [phone]   Fax:  [phone]  Physical Therapy Treatment  Patient Details  Name: Rebekah [name] MRN: [id number] Date of Birth: 1955-07-27 Referring Provider: Mechele Claude, PA-C  Encounter Date: 05/08/2016      PT End of Session - 05/08/16 1132    Visit Number 2   Number of Visits 12   Date for PT Re-Evaluation 06/12/16   PT Start Time 1030   PT Stop Time 1124   PT Time Calculation (min) 54 min      Past Medical History:  Diagnosis Date  . Arthritis   . Complication of anesthesia   . Difficulty sleeping    prior sleep study did not reveal sleep apnea per patient  . DJD (degenerative joint disease)   . Dysrhythmia    a-fib  . Environmental allergies   . GERD (gastroesophageal reflux disease)   . Heart murmur   . Hypertension    no meds after weight loss  . Hypothyroidism   . Incontinence of urine    at nite   . Left atrial dilatation   . Obesity    s/p gastric sleeve 12/2013 (previously weighed close to 400 lbs)  . Paroxysmal atrial fibrillation (HCC)   . Pneumonia    hx of   . PONV (postoperative nausea and vomiting)   . Supplemental oxygen dependent    uses 2l/[place] at night, states HR goes low and O2 drops  . Tuberculosis    had 6 month of INH due to exposure     Past Surgical History:  Procedure Laterality Date  . APPENDECTOMY    . CHOLECYSTECTOMY    . EYE SURGERY     03/2014 lens implant left lens   . FOOT ARTHRODESIS Left 03/15/2016   Procedure: TALONAVICULAR AND SUBTALAR ARTHRODESIS;  Surgeon: Wylene Simmer, MD;  Location: [place];  Service: Orthopedics;  Laterality: Left;  Marland Kitchen GASTROC RECESSION EXTREMITY Left 03/15/2016   Procedure: LEFT GASTROC RECESSION;  Surgeon: Wylene Simmer, MD;  Location: [place];  Service: Orthopedics;  Laterality: Left;  . JOINT REPLACEMENT  1999   L TOTAL KNEE  . LAPAROSCOPIC GASTRIC  SLEEVE RESECTION N/A 01/04/2014   Procedure: LAPAROSCOPIC GASTRIC SLEEVE RESECTION;  Surgeon: Greer Pickerel, MD;  [facility];  Service: General;  Laterality: N/A;  . TONSILLECTOMY    . TOTAL KNEE ARTHROPLASTY Right 05/17/2015   Procedure: RIGHT TOTAL KNEE ARTHROPLASTY;  Surgeon: Paralee Cancel, MD;  [facility];  Service: Orthopedics;  Laterality: Right;  . TRANSTHORACIC ECHOCARDIOGRAM  11/2012   EF 60-65%, mod LVH & mod conc hypertrophy, grade 1 diastolic dysfunction; LA mildly dilated; RV systolic pressure increased; PA peak pressure 74mmHg  . TUBAL LIGATION    . UPPER GI ENDOSCOPY  01/04/2014   Procedure: UPPER GI ENDOSCOPY;  Surgeon: Greer Pickerel, MD;  [facility];  Service: General;;    There were no vitals filed for this visit.                       [facility] Adult PT Treatment/Exercise - 05/08/16 0001      Ambulation/Gait   Gait Comments Gait with SPC and CAM boot in clinic working on Heel-toe pateern with less Hip ER     Exercises   Exercises Ankle;Knee/Hip     Knee/Hip Exercises: Supine   Straight Leg Raises AROM;Left;3 sets  3x 10-15  Modalities   Modalities Vasopneumatic     Vasopneumatic   Number Minutes Vasopneumatic  15 minutes   Vasopnuematic Location  Ankle   Vasopneumatic Pressure Low   Vasopneumatic Temperature  38     Ankle Exercises: Aerobic   Stationary Bike Nustep x 10 mins with boot on ,seat 10     Ankle Exercises: Seated   Other Seated Ankle Exercises rocker board DF/PF x 72mins                  PT Short Term Goals - 05/01/16 1219      PT SHORT TERM GOAL #1   Title Patient independent with initial HEP. (05/29/16)   Time 4   Period Weeks   Status New     PT SHORT TERM GOAL #2   Title Patient able to ambulate safely with SPC in the home. (05/29/16)   Time 4   Period Weeks   Status New           PT Long Term Goals - 05/01/16 1225      PT LONG TERM GOAL #1   Title Ind with advanced HEP.   Time 8    Period Weeks   Status New     PT LONG TERM GOAL #2   Title Decrease edema in L foot/ankle  to within 3 cms of contralateral side to assist with range of motion gains and decrease pain.   Time 8   Period Weeks   Status New     PT LONG TERM GOAL #3   Title Patient able to amb safely 300 feet in clinic with least restrictive AD and normal heel strike.   Time 8   Period Weeks   Status New     PT LONG TERM GOAL #4   Title Patient able to ambulate with pain no greater than 1/10 for community distances.   Time 8   Period Weeks   Status New     PT LONG TERM GOAL #5   Title Patient to be able to demonstrate good balance with activities on noncompliant surfaces to prevent falls.   Time 8   Period Weeks   Status New               Plan - 05/08/16 1133    Clinical Impression Statement Pt did fairly well today with LT ankle rehab and LE therex. She was able to perform seated rockerboard for PF/DF ROM with only minimal discomfort. We practiced gait with SPC and Cam boot with focus on heel-toe pattern and decreasing hip ER.   Rehab Potential Excellent   PT Frequency 2x / week   PT Treatment/Interventions ADLs/Self Care Home Management;Electrical Stimulation;Cryotherapy;Ultrasound;Gait training;Stair training;Functional mobility training;Therapeutic activities;Therapeutic exercise;Neuromuscular re-education;Balance training;Patient/family education;Passive range of motion;Scar mobilization;Manual techniques;Vasopneumatic Device   PT Next Visit Plan work on gait with SPC in boot, ankle ROM, strength NWB for ankle/knee/hip. modalities for pain/edema.   PT Home Exercise Plan ankle ROM, toe scrunch, towel stretch for DF   Consulted and Agree with Plan of Care Patient      Patient will benefit from skilled therapeutic intervention in order to improve the following deficits and impairments:  Abnormal gait, Decreased range of motion, Pain, Decreased strength, Increased edema  Visit  Diagnosis: Stiffness of left ankle, not elsewhere classified  Other abnormalities of gait and mobility  Localized edema     Problem List Patient Active Problem List   Diagnosis Date Noted  . Obese 05/20/2015  . S/P  right TKA 05/17/2015  . S/P knee replacement 05/17/2015  . Paroxysmal a-fib ([place]) 07/20/2014  . Prediabetes 01/06/2014  . Dyslipidemia 01/06/2014  . OA (osteoarthritis) 01/06/2014  . S/P laparoscopic sleeve gastrectomy 01/04/14 01/06/2014  . Postoperative atrial fibrillation - resolved 01/06/2014  . Morbid obesity ([place]) 01/04/2014  . Morbid obesity with BMI of 60.0-69.9, adult ([place]) 10/28/2013  . Dyspnea on exertion 09/05/2012  . Essential hypertension 04/21/2007  . G E R D 04/21/2007    Lasha Echeverria,CHRIS, PTA 05/08/2016, 12:04 PM  [facility] [place], [place], [address] Phone: [phone]   Fax:  [phone]  Name: Rebekah [name] MRN: [id number] Date of Birth: 10-10-1955

## 2016-05-09 ENCOUNTER — Encounter: Payer: Self-pay | Admitting: Physician Assistant

## 2016-05-15 ENCOUNTER — Ambulatory Visit: Payer: BC Managed Care – PPO | Admitting: Physical Therapy

## 2016-05-15 DIAGNOSIS — M25672 Stiffness of left ankle, not elsewhere classified: Secondary | ICD-10-CM

## 2016-05-15 DIAGNOSIS — R600 Localized edema: Secondary | ICD-10-CM

## 2016-05-15 DIAGNOSIS — R2689 Other abnormalities of gait and mobility: Secondary | ICD-10-CM

## 2016-05-15 NOTE — Therapy (Signed)
[facility] [place], [place], [address] Phone: [phone]   Fax:  [phone]  Physical Therapy Treatment  Patient Details  Name: Rebekah [name] MRN: [id number] Date of Birth: 04-Sep-1955 Referring Provider: Mechele Claude, PA-C  Encounter Date: 05/15/2016      PT End of Session - 05/15/16 1114    Visit Number 3   Number of Visits 12   Date for PT Re-Evaluation 06/12/16   PT Start Time 1030   Activity Tolerance Patient tolerated treatment well   Behavior During Therapy [facility] for tasks assessed/performed      Past Medical History:  Diagnosis Date  . Arthritis   . Complication of anesthesia   . Difficulty sleeping    prior sleep study did not reveal sleep apnea per patient  . DJD (degenerative joint disease)   . Dysrhythmia    a-fib  . Environmental allergies   . GERD (gastroesophageal reflux disease)   . Heart murmur   . Hypertension    no meds after weight loss  . Hypothyroidism   . Incontinence of urine    at nite   . Left atrial dilatation   . Obesity    s/p gastric sleeve 12/2013 (previously weighed close to 400 lbs)  . Paroxysmal atrial fibrillation (HCC)   . Pneumonia    hx of   . PONV (postoperative nausea and vomiting)   . Supplemental oxygen dependent    uses 2l/[place] at night, states HR goes low and O2 drops  . Tuberculosis    had 6 month of INH due to exposure     Past Surgical History:  Procedure Laterality Date  . APPENDECTOMY    . CHOLECYSTECTOMY    . EYE SURGERY     03/2014 lens implant left lens   . FOOT ARTHRODESIS Left 03/15/2016   Procedure: TALONAVICULAR AND SUBTALAR ARTHRODESIS;  Surgeon: Wylene Simmer, MD;  Location: [place];  Service: Orthopedics;  Laterality: Left;  Marland Kitchen GASTROC RECESSION EXTREMITY Left 03/15/2016   Procedure: LEFT GASTROC RECESSION;  Surgeon: Wylene Simmer, MD;  Location: [place];  Service: Orthopedics;  Laterality: Left;  . JOINT  REPLACEMENT  1999   L TOTAL KNEE  . LAPAROSCOPIC GASTRIC SLEEVE RESECTION N/A 01/04/2014   Procedure: LAPAROSCOPIC GASTRIC SLEEVE RESECTION;  Surgeon: Greer Pickerel, MD;  [facility];  Service: General;  Laterality: N/A;  . TONSILLECTOMY    . TOTAL KNEE ARTHROPLASTY Right 05/17/2015   Procedure: RIGHT TOTAL KNEE ARTHROPLASTY;  Surgeon: Paralee Cancel, MD;  [facility];  Service: Orthopedics;  Laterality: Right;  . TRANSTHORACIC ECHOCARDIOGRAM  11/2012   EF 60-65%, mod LVH & mod conc hypertrophy, grade 1 diastolic dysfunction; LA mildly dilated; RV systolic pressure increased; PA peak pressure 11mmHg  . TUBAL LIGATION    . UPPER GI ENDOSCOPY  01/04/2014   Procedure: UPPER GI ENDOSCOPY;  Surgeon: Greer Pickerel, MD;  [facility];  Service: General;;    There were no vitals filed for this visit.      Subjective Assessment - 05/15/16 1108    Subjective I'm doing good.   Pain Score 2    Pain Location Ankle   Pain Orientation Left   Pain Descriptors / Indicators Throbbing   Pain Type Surgical pain   Pain Frequency Intermittent                         [facility] Adult PT Treatment/Exercise - 05/15/16 0001  Exercises   Exercises Ankle     Knee/Hip Exercises: Aerobic   Nustep Level 7 x 15 minutes.     Vasopneumatic   Number Minutes Vasopneumatic  15 minutes   Vasopnuematic Location  --  Ankle.   Vasopneumatic Pressure Medium     Manual Therapy   Manual Therapy Passive ROM   Passive ROM PROM into left ankle dorsiflexion and plantarflexion x 10 minutes.     Ankle Exercises: Seated   Other Seated Ankle Exercises Rockerboard into dorsiflexion and plantarflexion x 5 minutes.                  PT Short Term Goals - 05/01/16 1219      PT SHORT TERM GOAL #1   Title Patient independent with initial HEP. (05/29/16)   Time 4   Period Weeks   Status New     PT SHORT TERM GOAL #2   Title Patient able to ambulate safely with SPC in the home. (05/29/16)    Time 4   Period Weeks   Status New           PT Long Term Goals - 05/01/16 1225      PT LONG TERM GOAL #1   Title Ind with advanced HEP.   Time 8   Period Weeks   Status New     PT LONG TERM GOAL #2   Title Decrease edema in L foot/ankle  to within 3 cms of contralateral side to assist with range of motion gains and decrease pain.   Time 8   Period Weeks   Status New     PT LONG TERM GOAL #3   Title Patient able to amb safely 300 feet in clinic with least restrictive AD and normal heel strike.   Time 8   Period Weeks   Status New     PT LONG TERM GOAL #4   Title Patient able to ambulate with pain no greater than 1/10 for community distances.   Time 8   Period Weeks   Status New     PT LONG TERM GOAL #5   Title Patient to be able to demonstrate good balance with activities on noncompliant surfaces to prevent falls.   Time 8   Period Weeks   Status New             Patient will benefit from skilled therapeutic intervention in order to improve the following deficits and impairments:  Abnormal gait, Decreased range of motion, Pain, Decreased strength, Increased edema  Visit Diagnosis: Stiffness of left ankle, not elsewhere classified  Other abnormalities of gait and mobility  Localized edema     Problem List Patient Active Problem List   Diagnosis Date Noted  . Obese 05/20/2015  . S/P right TKA 05/17/2015  . S/P knee replacement 05/17/2015  . Paroxysmal a-fib ([place]) 07/20/2014  . Prediabetes 01/06/2014  . Dyslipidemia 01/06/2014  . OA (osteoarthritis) 01/06/2014  . S/P laparoscopic sleeve gastrectomy 01/04/14 01/06/2014  . Postoperative atrial fibrillation - resolved 01/06/2014  . Morbid obesity ([place]) 01/04/2014  . Morbid obesity with BMI of 60.0-69.9, adult ([place]) 10/28/2013  . Dyspnea on exertion 09/05/2012  . Essential hypertension 04/21/2007  . [name] 04/21/2007    Trellis Guirguis, [place] MPT 05/15/2016, 11:19 AM  [facility] [address] [place], [place], [address] Phone: [phone]   Fax:  [phone]  Name: Rebekah [name] MRN: [id number] Date of Birth: [birth year]

## 2016-05-22 ENCOUNTER — Encounter: Payer: BC Managed Care – PPO | Admitting: *Deleted

## 2016-05-24 ENCOUNTER — Encounter: Payer: Self-pay | Admitting: Physical Therapy

## 2016-05-24 ENCOUNTER — Other Ambulatory Visit: Payer: Self-pay | Admitting: Cardiology

## 2016-05-24 ENCOUNTER — Ambulatory Visit: Payer: BC Managed Care – PPO | Attending: Orthopedic Surgery | Admitting: Physical Therapy

## 2016-05-24 DIAGNOSIS — M25672 Stiffness of left ankle, not elsewhere classified: Secondary | ICD-10-CM

## 2016-05-24 DIAGNOSIS — R2689 Other abnormalities of gait and mobility: Secondary | ICD-10-CM | POA: Insufficient documentation

## 2016-05-24 DIAGNOSIS — R600 Localized edema: Secondary | ICD-10-CM | POA: Insufficient documentation

## 2016-05-24 NOTE — Telephone Encounter (Signed)
Rx(s) sent to pharmacy electronically.  

## 2016-05-24 NOTE — Therapy (Signed)
[facility] [place], [place], [address] Phone: [phone]   Fax:  [phone]  Physical Therapy Treatment  Patient Details  Name: Rebekah [name] MRN: [id number] Date of Birth: 16-Jan-1956 Referring Provider: Mechele Claude, PA-C  Encounter Date: 05/24/2016      PT End of Session - 05/24/16 0952    Visit Number 4   Number of Visits 12   Date for PT Re-Evaluation 06/12/16   PT Start Time 0947   PT Stop Time 1030   PT Time Calculation (min) 43 min   Activity Tolerance Patient tolerated treatment well   Behavior During Therapy [facility] for tasks assessed/performed      Past Medical History:  Diagnosis Date  . Arthritis   . Complication of anesthesia   . Difficulty sleeping    prior sleep study did not reveal sleep apnea per patient  . DJD (degenerative joint disease)   . Dysrhythmia    a-fib  . Environmental allergies   . GERD (gastroesophageal reflux disease)   . Heart murmur   . Hypertension    no meds after weight loss  . Hypothyroidism   . Incontinence of urine    at nite   . Left atrial dilatation   . Obesity    s/p gastric sleeve 12/2013 (previously weighed close to 400 lbs)  . Paroxysmal atrial fibrillation (HCC)   . Pneumonia    hx of   . PONV (postoperative nausea and vomiting)   . Supplemental oxygen dependent    uses 2l/[place] at night, states HR goes low and O2 drops  . Tuberculosis    had 6 month of INH due to exposure     Past Surgical History:  Procedure Laterality Date  . APPENDECTOMY    . CHOLECYSTECTOMY    . EYE SURGERY     03/2014 lens implant left lens   . FOOT ARTHRODESIS Left 03/15/2016   Procedure: TALONAVICULAR AND SUBTALAR ARTHRODESIS;  Surgeon: Wylene Simmer, MD;  Location: [place];  Service: Orthopedics;  Laterality: Left;  Marland Kitchen GASTROC RECESSION EXTREMITY Left 03/15/2016   Procedure: LEFT GASTROC RECESSION;  Surgeon: Wylene Simmer, MD;  Location: [place];   Service: Orthopedics;  Laterality: Left;  . JOINT REPLACEMENT  1999   L TOTAL KNEE  . LAPAROSCOPIC GASTRIC SLEEVE RESECTION N/A 01/04/2014   Procedure: LAPAROSCOPIC GASTRIC SLEEVE RESECTION;  Surgeon: Greer Pickerel, MD;  [facility];  Service: General;  Laterality: N/A;  . TONSILLECTOMY    . TOTAL KNEE ARTHROPLASTY Right 05/17/2015   Procedure: RIGHT TOTAL KNEE ARTHROPLASTY;  Surgeon: Paralee Cancel, MD;  [facility];  Service: Orthopedics;  Laterality: Right;  . TRANSTHORACIC ECHOCARDIOGRAM  11/2012   EF 60-65%, mod LVH & mod conc hypertrophy, grade 1 diastolic dysfunction; LA mildly dilated; RV systolic pressure increased; PA peak pressure 39mmHg  . TUBAL LIGATION    . UPPER GI ENDOSCOPY  01/04/2014   Procedure: UPPER GI ENDOSCOPY;  Surgeon: Greer Pickerel, MD;  [facility];  Service: General;;    There were no vitals filed for this visit.      Subjective Assessment - 05/24/16 0959    Subjective Reports that her knees feel like they are getting overworked. Only to be in shoe 1 hour a day after will change into boot after PT today.   Pertinent History L foot arthrodesis (talonavicular and subtalar); L gastro recession; R TKA (1/17), afib, HTN; L wrist pain   How long can you  walk comfortably? 2 steps but walks short distances   Patient Stated Goals decrease pain, improve strength/RoM   Currently in Pain? No/denies            [facility] PT Assessment - 05/24/16 0001      Assessment   Medical Diagnosis L Post tibial tendon dysfunction   Onset Date/Surgical Date 03/15/16   Next MD Visit 06/2016     Precautions   Precaution Comments CAM boot or AD with ambulation   Required Braces or Orthoses Other Brace/Splint   Other Brace/Splint CAM boot     Restrictions   Other Position/Activity Restrictions WBAT                     [facility] Adult PT Treatment/Exercise - 05/24/16 0001      Knee/Hip Exercises: Aerobic   Nustep L5 x15 min     Knee/Hip Exercises: Seated    Long Arc Quad Strengthening;Left;2 sets;10 reps;Weights   Long Arc Quad Weight 4 lbs.     Modalities   Modalities Cryotherapy     Cryotherapy   Number Minutes Cryotherapy 12 Minutes   Cryotherapy Location Ankle   Type of Cryotherapy Ice pack     Ankle Exercises: Seated   Heel Raises 20 reps   Toe Raise 20 reps   Other Seated Ankle Exercises Rockerboard into dorsiflexion and plantarflexion x 3 minutes.   Other Seated Ankle Exercises Dynadisc PF/DF, circles x3 min                  PT Short Term Goals - 05/01/16 1219      PT SHORT TERM GOAL #1   Title Patient independent with initial HEP. (05/29/16)   Time 4   Period Weeks   Status New     PT SHORT TERM GOAL #2   Title Patient able to ambulate safely with SPC in the home. (05/29/16)   Time 4   Period Weeks   Status New           PT Long Term Goals - 05/01/16 1225      PT LONG TERM GOAL #1   Title Ind with advanced HEP.   Time 8   Period Weeks   Status New     PT LONG TERM GOAL #2   Title Decrease edema in L foot/ankle  to within 3 cms of contralateral side to assist with range of motion gains and decrease pain.   Time 8   Period Weeks   Status New     PT LONG TERM GOAL #3   Title Patient able to amb safely 300 feet in clinic with least restrictive AD and normal heel strike.   Time 8   Period Weeks   Status New     PT LONG TERM GOAL #4   Title Patient able to ambulate with pain no greater than 1/10 for community distances.   Time 8   Period Weeks   Status New     PT LONG TERM GOAL #5   Title Patient to be able to demonstrate good balance with activities on noncompliant surfaces to prevent falls.   Time 8   Period Weeks   Status New               Plan - 05/24/16 1020    Clinical Impression Statement Patient continues to utilize [facility] for ambulation but arrived in gym with regular tennis shoes on. Patient to apply CAM boot following today's treatment. Patient complained  of more knee pain  than any ankle pain. Patient able to complete minimal inv/ev direction with AROM circles. Patient denied any ankle pain with ankle exercises and only had L ankle popping and clicking with LAQ. Vasopnuematic system not avaliable during treatment thus cryotherapy pack applied to L ankle.   Rehab Potential Excellent   PT Frequency 2x / week   PT Duration 8 weeks   PT Treatment/Interventions ADLs/Self Care Home Management;Electrical Stimulation;Cryotherapy;Ultrasound;Gait training;Stair training;Functional mobility training;Therapeutic activities;Therapeutic exercise;Neuromuscular re-education;Balance training;Patient/family education;Passive range of motion;Scar mobilization;Manual techniques;Vasopneumatic Device   PT Next Visit Plan work on gait with SPC in boot, ankle ROM, strength NWB for ankle/knee/hip. modalities for pain/edema.   PT Home Exercise Plan ankle ROM, toe scrunch, towel stretch for DF   Consulted and Agree with Plan of Care Patient      Patient will benefit from skilled therapeutic intervention in order to improve the following deficits and impairments:  Abnormal gait, Decreased range of motion, Pain, Decreased strength, Increased edema  Visit Diagnosis: Stiffness of left ankle, not elsewhere classified  Other abnormalities of gait and mobility  Localized edema     Problem List Patient Active Problem List   Diagnosis Date Noted  . Obese 05/20/2015  . S/P right TKA 05/17/2015  . S/P knee replacement 05/17/2015  . Paroxysmal a-fib ([place]) 07/20/2014  . Prediabetes 01/06/2014  . Dyslipidemia 01/06/2014  . OA (osteoarthritis) 01/06/2014  . S/P laparoscopic sleeve gastrectomy 01/04/14 01/06/2014  . Postoperative atrial fibrillation - resolved 01/06/2014  . Morbid obesity ([place]) 01/04/2014  . Morbid obesity with BMI of 60.0-69.9, adult ([place]) 10/28/2013  . Dyspnea on exertion 09/05/2012  . Essential hypertension 04/21/2007  . [name] 04/21/2007    Wynelle Fanny,  PTA 05/24/2016, 10:35 AM  [facility] [address] [place], [place], [address] Phone: [phone]   Fax:  [phone]  Name: Rebekah [name] MRN: [id number] Date of Birth: 07-20-1955

## 2016-05-29 ENCOUNTER — Ambulatory Visit: Payer: BC Managed Care – PPO | Admitting: *Deleted

## 2016-05-29 DIAGNOSIS — R2689 Other abnormalities of gait and mobility: Secondary | ICD-10-CM

## 2016-05-29 DIAGNOSIS — R600 Localized edema: Secondary | ICD-10-CM

## 2016-05-29 DIAGNOSIS — M25672 Stiffness of left ankle, not elsewhere classified: Secondary | ICD-10-CM

## 2016-05-29 NOTE — Therapy (Signed)
[facility] [place], [place], [address] Phone: [phone]   Fax:  [phone]  Physical Therapy Treatment  Patient Details  Name: TAMARA DELHOYO MRN: [id number] Date of Birth: Jun 25, 1955 Referring Provider: Mechele Claude, PA-C  Encounter Date: 05/29/2016      PT End of Session - 05/29/16 1137    Visit Number 5   Number of Visits 12   Date for PT Re-Evaluation 06/12/16   PT Start Time 1115   PT Stop Time 1209   PT Time Calculation (min) 54 min      Past Medical History:  Diagnosis Date  . Arthritis   . Complication of anesthesia   . Difficulty sleeping    prior sleep study did not reveal sleep apnea per patient  . DJD (degenerative joint disease)   . Dysrhythmia    a-fib  . Environmental allergies   . GERD (gastroesophageal reflux disease)   . Heart murmur   . Hypertension    no meds after weight loss  . Hypothyroidism   . Incontinence of urine    at nite   . Left atrial dilatation   . Obesity    s/p gastric sleeve 12/2013 (previously weighed close to 400 lbs)  . Paroxysmal atrial fibrillation (HCC)   . Pneumonia    hx of   . PONV (postoperative nausea and vomiting)   . Supplemental oxygen dependent    uses 2l/[place] at night, states HR goes low and O2 drops  . Tuberculosis    had 6 month of INH due to exposure     Past Surgical History:  Procedure Laterality Date  . APPENDECTOMY    . CHOLECYSTECTOMY    . EYE SURGERY     03/2014 lens implant left lens   . FOOT ARTHRODESIS Left 03/15/2016   Procedure: TALONAVICULAR AND SUBTALAR ARTHRODESIS;  Surgeon: Wylene Simmer, MD;  Location: [place];  Service: Orthopedics;  Laterality: Left;  Marland Kitchen GASTROC RECESSION EXTREMITY Left 03/15/2016   Procedure: LEFT GASTROC RECESSION;  Surgeon: Wylene Simmer, MD;  Location: [place];  Service: Orthopedics;  Laterality: Left;  . JOINT REPLACEMENT  1999   L TOTAL KNEE  . LAPAROSCOPIC GASTRIC  SLEEVE RESECTION N/A 01/04/2014   Procedure: LAPAROSCOPIC GASTRIC SLEEVE RESECTION;  Surgeon: Greer Pickerel, MD;  [facility];  Service: General;  Laterality: N/A;  . TONSILLECTOMY    . TOTAL KNEE ARTHROPLASTY Right 05/17/2015   Procedure: RIGHT TOTAL KNEE ARTHROPLASTY;  Surgeon: Paralee Cancel, MD;  [facility];  Service: Orthopedics;  Laterality: Right;  . TRANSTHORACIC ECHOCARDIOGRAM  11/2012   EF 60-65%, mod LVH & mod conc hypertrophy, grade 1 diastolic dysfunction; LA mildly dilated; RV systolic pressure increased; PA peak pressure 25mmHg  . TUBAL LIGATION    . UPPER GI ENDOSCOPY  01/04/2014   Procedure: UPPER GI ENDOSCOPY;  Surgeon: Greer Pickerel, MD;  [facility];  Service: General;;    There were no vitals filed for this visit.      Subjective Assessment - 05/29/16 1134    Subjective Reports that her knees feel like they are getting overworked. Only to be in shoe 1 hour a day after will change into boot after PT today.   Pertinent History L foot arthrodesis (talonavicular and subtalar); L gastro recession; R TKA (1/17), afib, HTN; L wrist pain   How long can you walk comfortably? 2 steps but walks short distances   Patient Stated Goals decrease pain, improve strength/RoM  Currently in Pain? Yes   Pain Score 1    Pain Location Ankle   Pain Orientation Left   Pain Type Surgical pain   Pain Onset More than a month ago   Pain Frequency Intermittent                         [facility] Adult PT Treatment/Exercise - 05/29/16 0001      Exercises   Exercises Ankle     Knee/Hip Exercises: Aerobic   Nustep L5 x15 min  without boot     Vasopneumatic   Number Minutes Vasopneumatic  15 minutes   Vasopnuematic Location  Ankle   Vasopneumatic Pressure Medium   Vasopneumatic Temperature  38     Ankle Exercises: Standing   Rocker Board 5 minutes   Other Standing Ankle Exercises Dyna disc x 5 mins     Ankle Exercises: Seated   Other Seated Ankle Exercises Ankle  islator 1# DF and circles 3x10 each                  PT Short Term Goals - 05/01/16 1219      PT SHORT TERM GOAL #1   Title Patient independent with initial HEP. (05/29/16)   Time 4   Period Weeks   Status New     PT SHORT TERM GOAL #2   Title Patient able to ambulate safely with SPC in the home. (05/29/16)   Time 4   Period Weeks   Status New           PT Long Term Goals - 05/01/16 1225      PT LONG TERM GOAL #1   Title Ind with advanced HEP.   Time 8   Period Weeks   Status New     PT LONG TERM GOAL #2   Title Decrease edema in L foot/ankle  to within 3 cms of contralateral side to assist with range of motion gains and decrease pain.   Time 8   Period Weeks   Status New     PT LONG TERM GOAL #3   Title Patient able to amb safely 300 feet in clinic with least restrictive AD and normal heel strike.   Time 8   Period Weeks   Status New     PT LONG TERM GOAL #4   Title Patient able to ambulate with pain no greater than 1/10 for community distances.   Time 8   Period Weeks   Status New     PT LONG TERM GOAL #5   Title Patient to be able to demonstrate good balance with activities on noncompliant surfaces to prevent falls.   Time 8   Period Weeks   Status New               Plan - 05/29/16 1149    Clinical Impression Statement Pt arrived wearing regular tennis shoes again, but brought CAM boot for after Rx. She was able to perform ankle exs in standing and sitting today and did well. Her Inv/Ev ROM is limited due to surgery, but muscle activation is improving. LTGs are ongoing.    Rehab Potential Excellent   PT Frequency 2x / week   PT Duration 8 weeks   PT Treatment/Interventions ADLs/Self Care Home Management;Electrical Stimulation;Cryotherapy;Ultrasound;Gait training;Stair training;Functional mobility training;Therapeutic activities;Therapeutic exercise;Neuromuscular re-education;Balance training;Patient/family education;Passive range of  motion;Scar mobilization;Manual techniques;Vasopneumatic Device   PT Home Exercise Plan ankle ROM, toe scrunch, towel stretch for  DF   Consulted and Agree with Plan of Care Patient      Patient will benefit from skilled therapeutic intervention in order to improve the following deficits and impairments:  Abnormal gait, Decreased range of motion, Pain, Decreased strength, Increased edema  Visit Diagnosis: Stiffness of left ankle, not elsewhere classified  Other abnormalities of gait and mobility  Localized edema     Problem List Patient Active Problem List   Diagnosis Date Noted  . Obese 05/20/2015  . S/P right TKA 05/17/2015  . S/P knee replacement 05/17/2015  . Paroxysmal a-fib ([place]) 07/20/2014  . Prediabetes 01/06/2014  . Dyslipidemia 01/06/2014  . OA (osteoarthritis) 01/06/2014  . S/P laparoscopic sleeve gastrectomy 01/04/14 01/06/2014  . Postoperative atrial fibrillation - resolved 01/06/2014  . Morbid obesity ([place]) 01/04/2014  . Morbid obesity with BMI of 60.0-69.9, adult ([place]) 10/28/2013  . Dyspnea on exertion 09/05/2012  . Essential hypertension 04/21/2007  . G E R D 04/21/2007    RAMSEUR,CHRIS, PTA 05/29/2016, 1:13 PM  [facility] [place], [place], [address] Phone: [phone]   Fax:  [phone]  Name: HEALY FURTAK MRN: [id number] Date of Birth: Apr 12, 1956

## 2016-06-01 ENCOUNTER — Ambulatory Visit: Payer: BC Managed Care – PPO | Admitting: Physical Therapy

## 2016-06-01 DIAGNOSIS — M25672 Stiffness of left ankle, not elsewhere classified: Secondary | ICD-10-CM

## 2016-06-01 DIAGNOSIS — R600 Localized edema: Secondary | ICD-10-CM

## 2016-06-01 DIAGNOSIS — R2689 Other abnormalities of gait and mobility: Secondary | ICD-10-CM

## 2016-06-01 NOTE — Patient Instructions (Signed)
ANKLE: Dorsiflexion (Band)    Sit at edge of surface. Place band around top of foot. Keeping heel on floor, raise toes of banded foot. Hold _1-2__ seconds. Use ___green_____ band. _15__ reps per set, _1-2__ sets per day, _6-7__ days per week  Copyright  VHI. All rights reserved.    ANKLE: Eversion, Unilateral (Band)    Place band around left foot. Keeping heel in place, raise toes of banded foot up and away from body. Do not move hip. Hold _1-2__ seconds. Use __green______ band. _15__ reps per set, __1-2_ sets per day, _6-7__ days per week  Copyright  VHI. All rights reserved.    ANKLE: Inversion, Unilateral (Band)    Placing band around left foot. Keeping heel in place, lift toes of banded foot up and in. Do not move hip. Hold _1-2__ seconds. Use _freen____ band. _15__ reps per set, __1-2_ sets per day, __6-7_ days per week  Copyright  VHI. All rights reserved.    ANKLE: Plantarflexion - Sitting (Band)    Place band around foot; hold other end. Sit at edge of sitting surface. Keep heel in place. Push foot down against band. Hold _1-2__ seconds. Use ___green_____ band. _15__ reps per set, _1-2__ sets per day, _6-7__ days per week  Copyright  VHI. All rights reserved.    Single Leg (Compliant Surface) - Eyes Open    Stand on compliant surface: __pillow or cushion___ holding support. Lift right leg while maintaining balance over other leg. Progress to removing hands from support surface for longer periods of time. Hold__15__ seconds. Repeat __5__ times per session. Do _1-2___ sessions per day.  Copyright  VHI. All rights reserved.

## 2016-06-01 NOTE — Therapy (Signed)
[facility] [place], [place], [address] Phone: [phone]   Fax:  [phone]  Physical Therapy Treatment  Patient Details  Name: Rebekah [name] MRN: [id number] Date of Birth: 1955/07/15 Referring Provider: Mechele Claude, PA-C  Encounter Date: 06/01/2016      PT End of Session - 06/01/16 1113    Visit Number 6   Number of Visits 12   Date for PT Re-Evaluation 06/12/16   PT Start Time 1030   PT Stop Time 1125   PT Time Calculation (min) 55 min   Activity Tolerance Patient tolerated treatment well   Behavior During Therapy [facility] for tasks assessed/performed      Past Medical History:  Diagnosis Date  . Arthritis   . Complication of anesthesia   . Difficulty sleeping    prior sleep study did not reveal sleep apnea per patient  . DJD (degenerative joint disease)   . Dysrhythmia    a-fib  . Environmental allergies   . GERD (gastroesophageal reflux disease)   . Heart murmur   . Hypertension    no meds after weight loss  . Hypothyroidism   . Incontinence of urine    at nite   . Left atrial dilatation   . Obesity    s/p gastric sleeve 12/2013 (previously weighed close to 400 lbs)  . Paroxysmal atrial fibrillation (HCC)   . Pneumonia    hx of   . PONV (postoperative nausea and vomiting)   . Supplemental oxygen dependent    uses 2l/[place] at night, states HR goes low and O2 drops  . Tuberculosis    had 6 month of INH due to exposure     Past Surgical History:  Procedure Laterality Date  . APPENDECTOMY    . CHOLECYSTECTOMY    . EYE SURGERY     03/2014 lens implant left lens   . FOOT ARTHRODESIS Left 03/15/2016   Procedure: TALONAVICULAR AND SUBTALAR ARTHRODESIS;  Surgeon: Wylene Simmer, MD;  Location: [place];  Service: Orthopedics;  Laterality: Left;  Marland Kitchen GASTROC RECESSION EXTREMITY Left 03/15/2016   Procedure: LEFT GASTROC RECESSION;  Surgeon: Wylene Simmer, MD;  Location: [place];   Service: Orthopedics;  Laterality: Left;  . JOINT REPLACEMENT  1999   L TOTAL KNEE  . LAPAROSCOPIC GASTRIC SLEEVE RESECTION N/A 01/04/2014   Procedure: LAPAROSCOPIC GASTRIC SLEEVE RESECTION;  Surgeon: Greer Pickerel, MD;  [facility];  Service: General;  Laterality: N/A;  . TONSILLECTOMY    . TOTAL KNEE ARTHROPLASTY Right 05/17/2015   Procedure: RIGHT TOTAL KNEE ARTHROPLASTY;  Surgeon: Paralee Cancel, MD;  [facility];  Service: Orthopedics;  Laterality: Right;  . TRANSTHORACIC ECHOCARDIOGRAM  11/2012   EF 60-65%, mod LVH & mod conc hypertrophy, grade 1 diastolic dysfunction; LA mildly dilated; RV systolic pressure increased; PA peak pressure 70mmHg  . TUBAL LIGATION    . UPPER GI ENDOSCOPY  01/04/2014   Procedure: UPPER GI ENDOSCOPY;  Surgeon: Greer Pickerel, MD;  [facility];  Service: General;;    There were no vitals filed for this visit.      Subjective Assessment - 06/01/16 1037    Subjective had some pain in the ankle sweeping, no pain now.   Patient Stated Goals decrease pain, improve strength/RoM   Currently in Pain? No/denies                         [facility] Adult PT Treatment/Exercise -  06/01/16 1046      Knee/Hip Exercises: Aerobic   Recumbent Bike no resistance x 10 min     Vasopneumatic   Number Minutes Vasopneumatic  15 minutes   Vasopnuematic Location  Ankle   Vasopneumatic Pressure Medium   Vasopneumatic Temperature  max cold     Ankle Exercises: Supine   T-Band seated 4-way, green theraband x 15 LLE     Ankle Exercises: Standing   SLS 5x15 sec on compliant surface   Heel Raises 20 reps   Toe Raise 20 reps                PT Education - 06/01/16 1113    Education provided Yes   Education Details HEP   Person(s) Educated Patient   Methods Explanation   Comprehension Verbalized understanding          PT Short Term Goals - 06/01/16 1114      PT SHORT TERM GOAL #1   Title Patient independent with initial HEP. (05/29/16)    Baseline updated 2/16   Status Achieved     PT SHORT TERM GOAL #2   Title Patient able to ambulate safely with SPC in the home. (05/29/16)   Status Achieved           PT Long Term Goals - 05/01/16 1225      PT LONG TERM GOAL #1   Title Ind with advanced HEP.   Time 8   Period Weeks   Status New     PT LONG TERM GOAL #2   Title Decrease edema in L foot/ankle  to within 3 cms of contralateral side to assist with range of motion gains and decrease pain.   Time 8   Period Weeks   Status New     PT LONG TERM GOAL #3   Title Patient able to amb safely 300 feet in clinic with least restrictive AD and normal heel strike.   Time 8   Period Weeks   Status New     PT LONG TERM GOAL #4   Title Patient able to ambulate with pain no greater than 1/10 for community distances.   Time 8   Period Weeks   Status New     PT LONG TERM GOAL #5   Title Patient to be able to demonstrate good balance with activities on noncompliant surfaces to prevent falls.   Time 8   Period Weeks   Status New               Plan - 06/01/16 1114    Clinical Impression Statement Pt tolerated increased weight bearing exercises today with min increase in pain.  Will continue to benefit from PT to maximize function.   PT Treatment/Interventions ADLs/Self Care Home Management;Electrical Stimulation;Cryotherapy;Ultrasound;Gait training;Stair training;Functional mobility training;Therapeutic activities;Therapeutic exercise;Neuromuscular re-education;Balance training;Patient/family education;Passive range of motion;Scar mobilization;Manual techniques;Vasopneumatic Device   PT Next Visit Plan work on gait with SPC in boot, ankle ROM, strength NWB for ankle/knee/hip. modalities for pain/edema.   PT Home Exercise Plan ankle ROM, toe scrunch, towel stretch for DF, theraband, SLS   Consulted and Agree with Plan of Care Patient      Patient will benefit from skilled therapeutic intervention in order to  improve the following deficits and impairments:  Abnormal gait, Decreased range of motion, Pain, Decreased strength, Increased edema  Visit Diagnosis: Stiffness of left ankle, not elsewhere classified  Other abnormalities of gait and mobility  Localized edema     Problem List  Patient Active Problem List   Diagnosis Date Noted  . Obese 05/20/2015  . S/P right TKA 05/17/2015  . S/P knee replacement 05/17/2015  . Paroxysmal a-fib ([place]) 07/20/2014  . Prediabetes 01/06/2014  . Dyslipidemia 01/06/2014  . OA (osteoarthritis) 01/06/2014  . S/P laparoscopic sleeve gastrectomy 01/04/14 01/06/2014  . Postoperative atrial fibrillation - resolved 01/06/2014  . Morbid obesity ([place]) 01/04/2014  . Morbid obesity with BMI of 60.0-69.9, adult ([place]) 10/28/2013  . Dyspnea on exertion 09/05/2012  . Essential hypertension 04/21/2007  . Hermina Staggers 04/21/2007     Laureen Abrahams, PT, DPT 06/01/16 11:15 AM    [facility] [place], [place], [address] Phone: [phone]   Fax:  [phone]  Name: SARYIAH BARTHOLF MRN: [id number] Date of Birth: October 18, 1955

## 2016-06-04 ENCOUNTER — Ambulatory Visit: Payer: BC Managed Care – PPO | Admitting: Physical Therapy

## 2016-06-04 DIAGNOSIS — R2689 Other abnormalities of gait and mobility: Secondary | ICD-10-CM

## 2016-06-04 DIAGNOSIS — R600 Localized edema: Secondary | ICD-10-CM

## 2016-06-04 DIAGNOSIS — M25672 Stiffness of left ankle, not elsewhere classified: Secondary | ICD-10-CM | POA: Diagnosis not present

## 2016-06-04 NOTE — Therapy (Signed)
[facility] [place], [place], [address] Phone: [phone]   Fax:  [phone]  Physical Therapy Treatment  Patient Details  Name: Rebekah [name] MRN: [id number] Date of Birth: [birth year] Referring Provider: Mechele Claude, PA-C  Encounter Date: 06/04/2016      PT End of Session - 06/04/16 0811    Visit Number 7   Number of Visits 12   Date for PT Re-Evaluation 06/12/16   PT Start Time 0815   PT Stop Time 0916   PT Time Calculation (min) 61 min   Activity Tolerance Patient tolerated treatment well   Behavior During Therapy [facility] for tasks assessed/performed      Past Medical History:  Diagnosis Date  . Arthritis   . Complication of anesthesia   . Difficulty sleeping    prior sleep study did not reveal sleep apnea per patient  . DJD (degenerative joint disease)   . Dysrhythmia    a-fib  . Environmental allergies   . GERD (gastroesophageal reflux disease)   . Heart murmur   . Hypertension    no meds after weight loss  . Hypothyroidism   . Incontinence of urine    at nite   . Left atrial dilatation   . Obesity    s/p gastric sleeve 12/2013 (previously weighed close to 400 lbs)  . Paroxysmal atrial fibrillation (HCC)   . Pneumonia    hx of   . PONV (postoperative nausea and vomiting)   . Supplemental oxygen dependent    uses 2l/[place] at night, states HR goes low and O2 drops  . Tuberculosis    had 6 month of INH due to exposure     Past Surgical History:  Procedure Laterality Date  . APPENDECTOMY    . CHOLECYSTECTOMY    . EYE SURGERY     03/2014 lens implant left lens   . FOOT ARTHRODESIS Left 03/15/2016   Procedure: TALONAVICULAR AND SUBTALAR ARTHRODESIS;  Surgeon: Wylene Simmer, MD;  Location: [place];  Service: Orthopedics;  Laterality: Left;  Marland Kitchen GASTROC RECESSION EXTREMITY Left 03/15/2016   Procedure: LEFT GASTROC RECESSION;  Surgeon: Wylene Simmer, MD;  Location: [place];   Service: Orthopedics;  Laterality: Left;  . JOINT REPLACEMENT  1999   L TOTAL KNEE  . LAPAROSCOPIC GASTRIC SLEEVE RESECTION N/A 01/04/2014   Procedure: LAPAROSCOPIC GASTRIC SLEEVE RESECTION;  Surgeon: Greer Pickerel, MD;  [facility];  Service: General;  Laterality: N/A;  . TONSILLECTOMY    . TOTAL KNEE ARTHROPLASTY Right 05/17/2015   Procedure: RIGHT TOTAL KNEE ARTHROPLASTY;  Surgeon: Paralee Cancel, MD;  [facility];  Service: Orthopedics;  Laterality: Right;  . TRANSTHORACIC ECHOCARDIOGRAM  11/2012   EF 60-65%, mod LVH & mod conc hypertrophy, grade 1 diastolic dysfunction; LA mildly dilated; RV systolic pressure increased; PA peak pressure 63mHg  . TUBAL LIGATION    . UPPER GI ENDOSCOPY  01/04/2014   Procedure: UPPER GI ENDOSCOPY;  Surgeon: EGreer Pickerel MD;  [facility];  Service: General;;    There were no vitals filed for this visit.      Subjective Assessment - 06/04/16 0811    Subjective Patient states that her pain is never more than 1-2/10 with anything. She is going to [employer]   Pertinent History L foot arthrodesis (talonavicular and subtalar); L gastro recession; R TKA (1/17), afib, HTN; L wrist pain   How long can you walk comfortably? 2 steps but walks short distances  Patient Stated Goals decrease pain, improve strength/RoM   Currently in Pain? No/denies            [facility] PT Assessment - 06/04/16 0001      Figure 8 Edema   Figure 8 - Right  59.5  over socks   Figure 8 - Left  64  over socks                     [facility] Adult PT Treatment/Exercise - 06/04/16 0001      Ambulation/Gait   Ambulation/Gait Yes   Ambulation/Gait Assistance 7: Independent   Ambulation Distance (Feet) 180 Feet   Gait Pattern Trendelenburg  R   Pre-Gait Activities exaggerated step lenght B     Knee/Hip Exercises: Aerobic   Nustep L5 x12 min  without boot     Knee/Hip Exercises: Standing   Hip Abduction Stengthening;Both;2 sets;10 reps      Vasopneumatic   Number Minutes Vasopneumatic  15 minutes   Vasopnuematic Location  Ankle   Vasopneumatic Pressure Medium   Vasopneumatic Temperature  max cold     Ankle Exercises: Standing   Heel Raises 20 reps  R foot on 2 inch step and hands on back bar   Other Standing Ankle Exercises dyna disc x 3 min pf/df/ever/inv     Ankle Exercises: Seated   Other Seated Ankle Exercises Ankle islator 1# DF and inv/ever 2x10 each                  PT Short Term Goals - 06/01/16 1114      PT SHORT TERM GOAL #1   Title Patient independent with initial HEP. (05/29/16)   Baseline updated 2/16   Status Achieved     PT SHORT TERM GOAL #2   Title Patient able to ambulate safely with SPC in the home. (05/29/16)   Status Achieved           PT Long Term Goals - 06/04/16 0855      PT LONG TERM GOAL #1   Title Ind with advanced HEP.   Time 8   Period Weeks   Status On-going     PT LONG TERM GOAL #2   Title Decrease edema in L foot/ankle  to within 3 cms of contralateral side to assist with range of motion gains and decrease pain.   Baseline R 59.5 cm  L 64 cm (over socks) as of 06/04/16   Time 8   Period Weeks     PT LONG TERM GOAL #3   Title Patient able to amb safely 300 feet in clinic with least restrictive AD and normal heel strike.   Time 8   Period Weeks   Status On-going     PT LONG TERM GOAL #4   Title Patient able to ambulate with pain no greater than 1/10 for community distances.   Time 8   Period Weeks   Status On-going     PT LONG TERM GOAL #5   Title Patient to be able to demonstrate good balance with activities on noncompliant surfaces to prevent falls.   Time 8   Period Weeks   Status On-going               Plan - 06/04/16 0909    Clinical Impression Statement Patient did well with therapy today. No LTGs have been met at this time, but patient is progressing with strengthening.    Rehab Potential Excellent   PT Frequency  2x / week   PT  Duration 8 weeks   PT Treatment/Interventions ADLs/Self Care Home Management;Electrical Stimulation;Cryotherapy;Ultrasound;Gait training;Stair training;Functional mobility training;Therapeutic activities;Therapeutic exercise;Neuromuscular re-education;Balance training;Patient/family education;Passive range of motion;Scar mobilization;Manual techniques;Vasopneumatic Device   PT Next Visit Plan balance, gait, ankle ROM, strengthening; edema management   PT Home Exercise Plan ankle ROM, toe scrunch, towel stretch for DF, theraband, SLS   Consulted and Agree with Plan of Care Patient      Patient will benefit from skilled therapeutic intervention in order to improve the following deficits and impairments:  Abnormal gait, Decreased range of motion, Pain, Decreased strength, Increased edema  Visit Diagnosis: Stiffness of left ankle, not elsewhere classified  Other abnormalities of gait and mobility  Localized edema     Problem List Patient Active Problem List   Diagnosis Date Noted  . Obese 05/20/2015  . S/P right TKA 05/17/2015  . S/P knee replacement 05/17/2015  . Paroxysmal a-fib ([place]) 07/20/2014  . Prediabetes 01/06/2014  . Dyslipidemia 01/06/2014  . OA (osteoarthritis) 01/06/2014  . S/P laparoscopic sleeve gastrectomy 01/04/14 01/06/2014  . Postoperative atrial fibrillation - resolved 01/06/2014  . Morbid obesity ([place]) 01/04/2014  . Morbid obesity with BMI of 60.0-69.9, adult ([place]) 10/28/2013  . Dyspnea on exertion 09/05/2012  . Essential hypertension 04/21/2007  . Hermina Staggers 04/21/2007    Madelyn Flavors PT 06/04/2016, [facility] [address] [place], [place], [address] Phone: [phone]   Fax:  [phone]  Name: Rebekah [name] MRN: [id number] Date of Birth: 1956/02/28

## 2016-06-19 ENCOUNTER — Ambulatory Visit: Payer: BC Managed Care – PPO | Attending: Orthopedic Surgery | Admitting: *Deleted

## 2016-06-19 DIAGNOSIS — R2689 Other abnormalities of gait and mobility: Secondary | ICD-10-CM | POA: Insufficient documentation

## 2016-06-19 DIAGNOSIS — M25672 Stiffness of left ankle, not elsewhere classified: Secondary | ICD-10-CM | POA: Diagnosis not present

## 2016-06-19 DIAGNOSIS — R600 Localized edema: Secondary | ICD-10-CM

## 2016-06-19 NOTE — Therapy (Signed)
[facility] [place], [place], [address] Phone: [phone]   Fax:  [phone]  Physical Therapy Treatment  Patient Details  Name: Rebekah [name] MRN: [id number] Date of Birth: 1955/08/01 Referring Provider: Mechele Claude, PA-C  Encounter Date: 06/19/2016      PT End of Session - 06/19/16 1159    Visit Number 8   Number of Visits 12   Date for PT Re-Evaluation 06/26/16   PT Start Time 1031   PT Stop Time 1122   PT Time Calculation (min) 51 min   Activity Tolerance Patient tolerated treatment well   Behavior During Therapy [facility] for tasks assessed/performed      Past Medical History:  Diagnosis Date  . Arthritis   . Complication of anesthesia   . Difficulty sleeping    prior sleep study did not reveal sleep apnea per patient  . DJD (degenerative joint disease)   . Dysrhythmia    a-fib  . Environmental allergies   . GERD (gastroesophageal reflux disease)   . Heart murmur   . Hypertension    no meds after weight loss  . Hypothyroidism   . Incontinence of urine    at nite   . Left atrial dilatation   . Obesity    s/p gastric sleeve 12/2013 (previously weighed close to 400 lbs)  . Paroxysmal atrial fibrillation (HCC)   . Pneumonia    hx of   . PONV (postoperative nausea and vomiting)   . Supplemental oxygen dependent    uses 2l/[place] at night, states HR goes low and O2 drops  . Tuberculosis    had 6 month of INH due to exposure     Past Surgical History:  Procedure Laterality Date  . APPENDECTOMY    . CHOLECYSTECTOMY    . EYE SURGERY     03/2014 lens implant left lens   . FOOT ARTHRODESIS Left 03/15/2016   Procedure: TALONAVICULAR AND SUBTALAR ARTHRODESIS;  Surgeon: Wylene Simmer, MD;  Location: [place];  Service: Orthopedics;  Laterality: Left;  Marland Kitchen GASTROC RECESSION EXTREMITY Left 03/15/2016   Procedure: LEFT GASTROC RECESSION;  Surgeon: Wylene Simmer, MD;  Location: [place];   Service: Orthopedics;  Laterality: Left;  . JOINT REPLACEMENT  1999   L TOTAL KNEE  . LAPAROSCOPIC GASTRIC SLEEVE RESECTION N/A 01/04/2014   Procedure: LAPAROSCOPIC GASTRIC SLEEVE RESECTION;  Surgeon: Greer Pickerel, MD;  [facility]: [facility];  Service: General;  Laterality: N/A;  . TONSILLECTOMY    . TOTAL KNEE ARTHROPLASTY Right 05/17/2015   Procedure: RIGHT TOTAL KNEE ARTHROPLASTY;  Surgeon: Paralee Cancel, MD;  Location: [facility];  Service: Orthopedics;  Laterality: Right;  . TRANSTHORACIC ECHOCARDIOGRAM  11/2012   EF 60-65%, mod LVH & mod conc hypertrophy, grade 1 diastolic dysfunction; LA mildly dilated; RV systolic pressure increased; PA peak pressure 26mmHg  . TUBAL LIGATION    . UPPER GI ENDOSCOPY  01/04/2014   Procedure: UPPER GI ENDOSCOPY;  Surgeon: Greer Pickerel, MD;  Location: [facility];  Service: General;;    There were no vitals filed for this visit.      Subjective Assessment - 06/19/16 1058    Subjective Patient states that her pain is never more than 1-2/10 with anything. She is going to [employer]   Pertinent History L foot arthrodesis (talonavicular and subtalar); L gastro recession; R TKA (1/17), afib, HTN; L wrist pain   How long can you walk comfortably? 2 steps but walks short distances  Patient Stated Goals decrease pain, improve strength/RoM   Currently in Pain? Yes   Pain Score 2    Pain Orientation Left   Pain Type Surgical pain   Pain Onset More than a month ago   Pain Frequency Intermittent                         [facility] Adult PT Treatment/Exercise - 06/19/16 0001      Knee/Hip Exercises: Aerobic   Nustep L5 x15 min  without boot     Knee/Hip Exercises: Standing   Hip Abduction Stengthening;Both;2 sets;10 reps     Vasopneumatic   Number Minutes Vasopneumatic  15 minutes   Vasopnuematic Location  Ankle   Vasopneumatic Pressure Medium   Vasopneumatic Temperature  max cold     Ankle Exercises: Standing   SLS on L with one hand  decreasing to 1 finger  difficult   Rocker Board 5 minutes   Heel Raises 20 reps  R foot on 2 inch step and hands on back bar     Ankle Exercises: Seated   Other Seated Ankle Exercises Ankle islator 2# DF and inv/ever 2x10 each                  PT Short Term Goals - 06/01/16 1114      PT SHORT TERM GOAL #1   Title Patient independent with initial HEP. (05/29/16)   Baseline updated 2/16   Status Achieved     PT SHORT TERM GOAL #2   Title Patient able to ambulate safely with SPC in the home. (05/29/16)   Status Achieved           PT Long Term Goals - 06/04/16 0855      PT LONG TERM GOAL #1   Title Ind with advanced HEP.   Time 8   Period Weeks   Status On-going     PT LONG TERM GOAL #2   Title Decrease edema in L foot/ankle  to within 3 cms of contralateral side to assist with range of motion gains and decrease pain.   Baseline R 59.5 cm  L 64 cm (over socks) as of 06/04/16   Time 8   Period Weeks     PT LONG TERM GOAL #3   Title Patient able to amb safely 300 feet in clinic with least restrictive AD and normal heel strike.   Time 8   Period Weeks   Status On-going     PT LONG TERM GOAL #4   Title Patient able to ambulate with pain no greater than 1/10 for community distances.   Time 8   Period Weeks   Status On-going     PT LONG TERM GOAL #5   Title Patient to be able to demonstrate good balance with activities on noncompliant surfaces to prevent falls.   Time 8   Period Weeks   Status On-going               Plan - 06/19/16 1200    Clinical Impression Statement Pt arrived in clinic today a little sore from walking a lot in boot. She is Doing better walking in shoe and will try to wean from boot even more. DF ROM was 15 degrees today.   Rehab Potential Excellent   PT Frequency 2x / week   PT Duration 8 weeks   PT Treatment/Interventions ADLs/Self Care Home Management;Electrical Stimulation;Cryotherapy;Ultrasound;Gait training;Stair  training;Functional mobility training;Therapeutic activities;Therapeutic exercise;Neuromuscular re-education;Balance  training;Patient/family education;Passive range of motion;Scar mobilization;Manual techniques;Vasopneumatic Device   PT Next Visit Plan balance, gait, ankle ROM, strengthening; edema management   PT Home Exercise Plan ankle ROM, toe scrunch, towel stretch for DF, theraband, SLS   Consulted and Agree with Plan of Care Patient      Patient will benefit from skilled therapeutic intervention in order to improve the following deficits and impairments:  Abnormal gait, Decreased range of motion, Pain, Decreased strength, Increased edema  Visit Diagnosis: Stiffness of left ankle, not elsewhere classified  Other abnormalities of gait and mobility  Localized edema     Problem List Patient Active Problem List   Diagnosis Date Noted  . Obese 05/20/2015  . S/P right TKA 05/17/2015  . S/P knee replacement 05/17/2015  . Paroxysmal a-fib ([place]) 07/20/2014  . Prediabetes 01/06/2014  . Dyslipidemia 01/06/2014  . OA (osteoarthritis) 01/06/2014  . S/P laparoscopic sleeve gastrectomy 01/04/14 01/06/2014  . Postoperative atrial fibrillation - resolved 01/06/2014  . Morbid obesity ([place]) 01/04/2014  . Morbid obesity with BMI of 60.0-69.9, adult ([place]) 10/28/2013  . Dyspnea on exertion 09/05/2012  . Essential hypertension 04/21/2007  . G E R D 04/21/2007    RAMSEUR,CHRIS, PTA 06/19/2016, 12:16 PM  [facility] [place], [place], [address] Phone: [phone]   Fax:  [phone]  Name: Rebekah [name] MRN: [id number] Date of Birth: 10/18/1955

## 2016-06-22 ENCOUNTER — Ambulatory Visit: Payer: BC Managed Care – PPO | Admitting: *Deleted

## 2016-06-22 DIAGNOSIS — M25672 Stiffness of left ankle, not elsewhere classified: Secondary | ICD-10-CM | POA: Diagnosis not present

## 2016-06-22 DIAGNOSIS — R2689 Other abnormalities of gait and mobility: Secondary | ICD-10-CM

## 2016-06-22 DIAGNOSIS — R600 Localized edema: Secondary | ICD-10-CM

## 2016-06-22 NOTE — Therapy (Addendum)
[facility] [place], [place], [address] Phone: [phone]   Fax:  [phone]  Physical Therapy Treatment  Patient Details  Name: Rebekah [name] MRN: [id number] Date of Birth: 11/07/1955 Referring Provider: Mechele Claude, PA-C  Encounter Date: 06/22/2016      PT End of Session - 06/22/16 1132    Visit Number 9   Number of Visits 12   Date for PT Re-Evaluation 06/26/16   PT Start Time 7614   PT Stop Time 1123   PT Time Calculation (min) 51 min      Past Medical History:  Diagnosis Date  . Arthritis   . Complication of anesthesia   . Difficulty sleeping    prior sleep study did not reveal sleep apnea per patient  . DJD (degenerative joint disease)   . Dysrhythmia    a-fib  . Environmental allergies   . GERD (gastroesophageal reflux disease)   . Heart murmur   . Hypertension    no meds after weight loss  . Hypothyroidism   . Incontinence of urine    at nite   . Left atrial dilatation   . Obesity    s/p gastric sleeve 12/2013 (previously weighed close to 400 lbs)  . Paroxysmal atrial fibrillation (HCC)   . Pneumonia    hx of   . PONV (postoperative nausea and vomiting)   . Supplemental oxygen dependent    uses 2l/ at night, states HR goes low and O2 drops  . Tuberculosis    had 6 month of INH due to exposure     Past Surgical History:  Procedure Laterality Date  . APPENDECTOMY    . CHOLECYSTECTOMY    . EYE SURGERY     03/2014 lens implant left lens   . FOOT ARTHRODESIS Left 03/15/2016   Procedure: TALONAVICULAR AND SUBTALAR ARTHRODESIS;  Surgeon: Wylene Simmer, MD;  Location: [place];  Service: Orthopedics;  Laterality: Left;  Marland Kitchen GASTROC RECESSION EXTREMITY Left 03/15/2016   Procedure: LEFT GASTROC RECESSION;  Surgeon: Wylene Simmer, MD;  Location: [place];  Service: Orthopedics;  Laterality: Left;  . JOINT REPLACEMENT  1999   L TOTAL KNEE  . LAPAROSCOPIC GASTRIC SLEEVE  RESECTION N/A 01/04/2014   Procedure: LAPAROSCOPIC GASTRIC SLEEVE RESECTION;  Surgeon: Greer Pickerel, MD;  [facility];  Service: General;  Laterality: N/A;  . TONSILLECTOMY    . TOTAL KNEE ARTHROPLASTY Right 05/17/2015   Procedure: RIGHT TOTAL KNEE ARTHROPLASTY;  Surgeon: Paralee Cancel, MD;  [facility];  Service: Orthopedics;  Laterality: Right;  . TRANSTHORACIC ECHOCARDIOGRAM  11/2012   EF 60-65%, mod LVH & mod conc hypertrophy, grade 1 diastolic dysfunction; LA mildly dilated; RV systolic pressure increased; PA peak pressure 33mHg  . TUBAL LIGATION    . UPPER GI ENDOSCOPY  01/04/2014   Procedure: UPPER GI ENDOSCOPY;  Surgeon: EGreer Pickerel MD;  [facility];  Service: General;;    There were no vitals filed for this visit.      Subjective Assessment - 06/22/16 1045    Subjective My LT ankle is hurting more across the top an   Pertinent History L foot arthrodesis (talonavicular and subtalar); L gastro recession; R TKA (1/17), afib, HTN; L wrist pain   How long can you walk comfortably? 2 steps but walks short distances   Patient Stated Goals decrease pain, improve strength/RoM  [place] Adult PT Treatment/Exercise - 06/22/16 0001      Knee/Hip Exercises: Aerobic   Nustep L5 x10  min     Vasopneumatic   Number Minutes Vasopneumatic  15 minutes   Vasopnuematic Location  Ankle   Vasopneumatic Pressure Medium   Vasopneumatic Temperature  max cold     Manual Therapy   Manual Therapy Soft tissue mobilization;Myofascial release   Soft tissue mobilization STW and ISTM along incisions and entire Anteriolateral aspect of LT foot to mobilize scars and decrease mild swelling around distal aspect of Lateral mallleolus                  PT Short Term Goals - 06/01/16 1114      PT SHORT TERM GOAL #1   Title Patient independent with initial HEP. (05/29/16)   Baseline updated 2/16   Status Achieved     PT SHORT TERM GOAL #2   Title  Patient able to ambulate safely with SPC in the home. (05/29/16)   Status Achieved           PT Long Term Goals - 06/04/16 0855      PT LONG TERM GOAL #1   Title Ind with advanced HEP.   Time 8   Period Weeks   Status On-going     PT LONG TERM GOAL #2   Title Decrease edema in L foot/ankle  to within 3 cms of contralateral side to assist with range of motion gains and decrease pain.   Baseline R 59.5 cm  L 64 cm (over socks) as of 06/04/16   Time 8   Period Weeks     PT LONG TERM GOAL #3   Title Patient able to amb safely 300 feet in clinic with least restrictive AD and normal heel strike.   Time 8   Period Weeks   Status On-going     PT LONG TERM GOAL #4   Title Patient able to ambulate with pain no greater than 1/10 for community distances.   Time 8   Period Weeks   Status On-going     PT LONG TERM GOAL #5   Title Patient to be able to demonstrate good balance with activities on noncompliant surfaces to prevent falls.   Time 8   Period Weeks   Status On-going               Plan - 06/22/16 1133    Clinical Impression Statement Pt arrived to clinic today with increased pain along the top of her ankle and distal malleolus. She also had some mild swelling around distal malleolus. STW and scar massage was performed F/B Vasopnuematic and had decreased pain after Rx. Her DF ROM was still good at 15 degrees. To MD next week   Rehab Potential Excellent   PT Frequency 2x / week   PT Duration 8 weeks   PT Treatment/Interventions ADLs/Self Care Home Management;Electrical Stimulation;Cryotherapy;Ultrasound;Gait training;Stair training;Functional mobility training;Therapeutic activities;Therapeutic exercise;Neuromuscular re-education;Balance training;Patient/family education;Passive range of motion;Scar mobilization;Manual techniques;Vasopneumatic Device   PT Next Visit Plan balance, gait, ankle ROM, strengthening; edema management   Send MD note   PT Home Exercise Plan  ankle ROM, toe scrunch, towel stretch for DF, theraband, SLS   Consulted and Agree with Plan of Care Patient      Patient will benefit from skilled therapeutic intervention in order to improve the following deficits and impairments:  Abnormal gait, Decreased range of motion, Pain, Decreased strength, Increased edema  Visit Diagnosis: Stiffness  of left ankle, not elsewhere classified  Other abnormalities of gait and mobility  Localized edema     Problem List Patient Active Problem List   Diagnosis Date Noted  . Obese 05/20/2015  . S/P right TKA 05/17/2015  . S/P knee replacement 05/17/2015  . Paroxysmal a-fib ([place]) 07/20/2014  . Prediabetes 01/06/2014  . Dyslipidemia 01/06/2014  . OA (osteoarthritis) 01/06/2014  . S/P laparoscopic sleeve gastrectomy 01/04/14 01/06/2014  . Postoperative atrial fibrillation - resolved 01/06/2014  . Morbid obesity ([place]) 01/04/2014  . Morbid obesity with BMI of 60.0-69.9, adult ([place]) 10/28/2013  . Dyspnea on exertion 09/05/2012  . Essential hypertension 04/21/2007  . G E R D 04/21/2007    APPLEGATE, [place], PTA 06/22/2016, 12:54 PM Mali Applegate MPT [facility] [address] [place], [place], [address] Phone: [phone]   Fax:  [phone]  Name: EVANGELYNE LOJA MRN: [id number] Date of Birth: April 24, 1955  PHYSICAL THERAPY DISCHARGE SUMMARY  Visits from Start of Care: 9.  Current functional level related to goals / functional outcomes: See above.   Remaining deficits: STG's met.  LTG's unmet.   Education / Equipment: HEP. Plan: Patient agrees to discharge.  Patient goals were not met. Patient is being discharged due to not returning since the last visit.  ?????         Mali Applegate MPT

## 2016-08-07 ENCOUNTER — Ambulatory Visit (INDEPENDENT_AMBULATORY_CARE_PROVIDER_SITE_OTHER): Payer: BC Managed Care – PPO | Admitting: Physician Assistant

## 2016-08-07 VITALS — BP 147/91 | HR 98

## 2016-08-07 DIAGNOSIS — T782XXA Anaphylactic shock, unspecified, initial encounter: Secondary | ICD-10-CM

## 2016-08-07 MED ORDER — EPINEPHRINE 0.3 MG/0.3ML IJ SOAJ: 0.3000 mg | Freq: Once | INTRAMUSCULAR | 6 refills | Status: DC | PRN

## 2016-08-07 MED ORDER — EPINEPHRINE 0.3 MG/0.3ML IJ SOAJ
0.3000 mg | Freq: Once | INTRAMUSCULAR | Status: AC
Start: 2016-08-07 — End: 2016-08-07

## 2016-08-07 MED ORDER — DIPHENHYDRAMINE HCL 50 MG/ML IJ SOLN
50.0000 mg | Freq: Once | INTRAMUSCULAR | Status: AC
  Administered 2016-08-07: 50 mg via INTRAMUSCULAR

## 2016-08-07 MED ORDER — EPINEPHRINE 0.3 MG/0.3ML IJ SOAJ
0.3000 mg | Freq: Once | INTRAMUSCULAR | Status: DC
  Administered 2016-08-07: 0.3 mg via SUBCUTANEOUS

## 2016-08-07 NOTE — Patient Instructions (Addendum)
Alpha gal allergy Anaphylactic Reaction An anaphylactic reaction (anaphylaxis) is a sudden allergic reaction that is very bad (severe). It also affects more than one part of the body. This condition can be life-threatening. If you have an anaphylactic reaction, you need to get medical help right away. You may need to stay in the hospital. Your doctor may teach you how to use an allergy kit (anaphylaxis kit) and how to give yourself an allergy shot (epinephrine injection). You can give yourself an allergy shot with what is commonly called an auto-injector "pen." Symptoms of an anaphylactic reaction may include:  A stuffy nose (nasal congestion).  Headache.  Tingling in your mouth.  A flushed face.  An itchy, red rash.  Swelling of your eyes, lips, face, or tongue.  Swelling of the back of your mouth and your throat.  Breathing loudly (wheezing).  A hoarse voice.  Itchy, red, swollen areas of skin (hives).  Dizziness or light-headedness.  Passing out (fainting).  Feeling worried or nervous (anxiety).  Feeling confused.  Pain in your belly (abdomen) or chest.  Trouble with breathing, talking, or swallowing.  A tight feeling in your chest or throat.  Fast or uneven heartbeats (palpitations).  Throwing up (vomiting).  Watery poop (diarrhea). Follow these instructions at home: Safety   Always keep an auto-injector pen or your allergy kit with you. These could save your life. Use them as told by your doctor.  Do not drive until your doctor says that it is safe.  Make sure that you, the people who live with you, and your employer know:  How to use your allergy kit.  How to use an auto-injector pen to give you an allergy shot.  If you used your auto-injector pen:  Get more medicine for it right away. This is important in case you have another reaction.  Get help right away.  Wear a bracelet or necklace that says you have an allergy, if your doctor tells you to  do this.  Learn the signs of a very bad allergic reaction.  Work with your doctors to make a plan for what to do if you have a very bad allergic reaction. Being prepared is important. General instructions   Take over-the-counter and prescription medicines only as told by your doctor.  If you have itchy, red, swollen areas of skin or a rash:  Use over-the-counter medicine (antihistamine) as told by your doctor.  Put cold, wet cloths (cold compresses) on your skin.  Take baths or showers in cool water. Avoid hot water.  If you had tests done, it is up to you to get your test results. Ask your doctor when your results will be ready.  Tell any doctors who care for you that you have an allergy.  Keep all follow-up visits as told by your doctor. This is important. How is this prevented?  Avoid things (allergens) that gave you a very bad allergic reaction before.  If you have a food allergy and you go to a restaurant, tell your server about your allergy. If you are not sure if your meal was made with food that you are allergic to, ask your server before you eat it. Contact a doctor if:  You have symptoms of an allergic reaction. You may notice them soon after being around whatever it is that you are allergic to. Symptoms may include:  A rash.  A headache.  Sneezing or a runny nose.  Swelling.  Feeling sick to your stomach.  Watery  poop. Get help right away if:  You had to use your auto-injector pen. You must go to the emergency room even if the medicine seems to be working.  You have any of these:  A tight feeling in your chest or your throat.  Loud breathing.  Trouble with breathing.  Itchy, red, swollen areas of skin.  Red skin or itching all over your body.  Swelling in your lips, tongue, or the back of your throat.  You have throwing up that gets very bad.  You have watery poop that gets very bad.  You pass out or feel like you might pass out. These  symptoms may be an emergency. Do not wait to see if the symptoms will go away. Use your auto-injector pen or allergy kit as you have been told. Get medical help right away. Call your local emergency services (911 in the [place] Do not drive yourself to the hospital. Summary  An anaphylactic reaction (anaphylaxis) is a sudden allergic reaction that is very bad (severe).  This condition can be life-threatening. If you have an anaphylactic reaction, you need to get medical help right away.  Your doctor may teach you how to use an allergy kit (anaphylaxis kit) and how to give yourself an allergy shot (epinephrine injection) with an auto-injector "pen."  Always keep an auto-injector pen or your allergy kit with you. These could save your life. Use them as told by your doctor.  If you had to use your auto-injector pen, you must go to the emergency room even if the medicine seems to be working. This information is not intended to replace advice given to you by your health care provider. Make sure you discuss any questions you have with your health care provider. Document Released: 09/19/2007 Document Revised: 11/25/2015 Document Reviewed: 11/25/2015 Elsevier Interactive Patient Education  2017 [employer].

## 2016-08-08 ENCOUNTER — Encounter: Payer: Self-pay | Admitting: Physician Assistant

## 2016-08-08 NOTE — Progress Notes (Signed)
BP (!) 147/91   Pulse 98   SpO2 91%    Subjective:    Patient ID: Rebekah [name], female    DOB: [birth year], 61 y.o.   MRN: [id number]  HPI: Rebekah [name] is a 61 y.o. female presenting on 08/07/2016 for Allergic Reaction  Patient woke up this morning having a little bit of itching on her skin. It in the past hour before arriving greatly increased and she began to have swelling even in her face and she felt dyspneic. She had a reaction like this about 20 years ago to Mucinex. She has not had any exposure to that. She has only drink something today and has not taken any medications. She has no new products that she is using, cleansers, detergent, household items. She has had many tick bites over her lifetime. We have discussed the possibility of a protein allergy that people can develop after tick bites. Until she is seen by the allergist asked her to hold any pork and beef and to only eat fish and chicken.  Relevant past medical, surgical, family and social history reviewed and updated as indicated. Allergies and medications reviewed and updated.  Past Medical History:  Diagnosis Date  . Arthritis   . Complication of anesthesia   . Difficulty sleeping    prior sleep study did not reveal sleep apnea per patient  . DJD (degenerative joint disease)   . Dysrhythmia    a-fib  . Environmental allergies   . GERD (gastroesophageal reflux disease)   . Heart murmur   . Hypertension    no meds after weight loss  . Hypothyroidism   . Incontinence of urine    at nite   . Left atrial dilatation   . Obesity    s/p gastric sleeve 12/2013 (previously weighed close to 400 lbs)  . Paroxysmal atrial fibrillation (HCC)   . Pneumonia    hx of   . PONV (postoperative nausea and vomiting)   . Supplemental oxygen dependent    uses 2l/Ideal at night, states HR goes low and O2 drops  . Tuberculosis    had 6 month of INH due to exposure     Past Surgical History:  Procedure Laterality Date  .  APPENDECTOMY    . CHOLECYSTECTOMY    . EYE SURGERY     03/2014 lens implant left lens   . FOOT ARTHRODESIS Left 03/15/2016   Procedure: TALONAVICULAR AND SUBTALAR ARTHRODESIS;  Surgeon: Wylene Simmer, MD;  Location: [place];  Service: Orthopedics;  Laterality: Left;  Marland Kitchen GASTROC RECESSION EXTREMITY Left 03/15/2016   Procedure: LEFT GASTROC RECESSION;  Surgeon: Wylene Simmer, MD;  Location: [place];  Service: Orthopedics;  Laterality: Left;  . JOINT REPLACEMENT  1999   L TOTAL KNEE  . LAPAROSCOPIC GASTRIC SLEEVE RESECTION N/A 01/04/2014   Procedure: LAPAROSCOPIC GASTRIC SLEEVE RESECTION;  Surgeon: Greer Pickerel, MD;  Location: [facility];  Service: General;  Laterality: N/A;  . TONSILLECTOMY    . TOTAL KNEE ARTHROPLASTY Right 05/17/2015   Procedure: RIGHT TOTAL KNEE ARTHROPLASTY;  Surgeon: Paralee Cancel, MD;  Location: [facility];  Service: Orthopedics;  Laterality: Right;  . TRANSTHORACIC ECHOCARDIOGRAM  11/2012   EF 60-65%, mod LVH & mod conc hypertrophy, grade 1 diastolic dysfunction; LA mildly dilated; RV systolic pressure increased; PA peak pressure 25mmHg  . TUBAL LIGATION    . UPPER GI ENDOSCOPY  01/04/2014   Procedure: UPPER GI ENDOSCOPY;  Surgeon: Greer Pickerel, MD;  Location:  [facility];  Service: General;;    Review of Systems  Constitutional: Positive for diaphoresis. Negative for activity change, fatigue and fever.  HENT: Negative.   Eyes: Negative.   Respiratory: Positive for choking and shortness of breath. Negative for cough, wheezing and stridor.   Cardiovascular: Negative.  Negative for chest pain.  Gastrointestinal: Negative.  Negative for abdominal pain.  Endocrine: Negative.   Genitourinary: Negative.  Negative for dysuria.  Musculoskeletal: Negative.   Skin: Positive for color change and rash.  Neurological: Negative.     Allergies as of 08/07/2016      Reactions   Mucinex [guaifenesin Er] Anaphylaxis   Ace Inhibitors Swelling, Cough   Pedal Edema     Propoxyphene Hcl Other (See Comments)   REACTION: hallucinate- Darvon   Hydromet [hydrocodone-homatropine] Itching, Other (See Comments)   Severe stomach pain-face itches.    Sulfonamide Derivatives Rash      Medication List       Accurate as of 08/07/16 11:59 PM. Always use your most recent med list.          acetaminophen 500 MG tablet Commonly known as:  TYLENOL Take 500-1,000 mg by mouth every 6 (six) hours as needed for moderate pain or headache.   BIOTIN PO Take 5,000 mcg by mouth daily.   CALCIUM PO Take 1,000 mg by mouth daily.   cetirizine 10 MG tablet Commonly known as:  ZYRTEC Take 10 mg by mouth at bedtime.   diltiazem 120 MG 24 hr capsule Commonly known as:  DILACOR XR Take 1 capsule (120 mg total) by mouth daily.   diltiazem 30 MG tablet Commonly known as:  CARDIZEM Take 1-2 tablets by mouth every 6 hours as needed for fast heart rates   diltiazem 120 MG 24 hr capsule Commonly known as:  CARTIA XT Take 1 capsule (120 mg total) by mouth daily. PLEASE CONTACT OFFICE FOR ADDITIONAL REFILLS   diltiazem 120 MG 24 hr capsule Commonly known as:  CARTIA XT Take 1 capsule (120 mg total) by mouth daily. <PLEASE MAKE APPOINTMENT FOR REFILLS>   EPINEPHrine 0.3 mg/0.3 mL Soaj injection Commonly known as:  EPIPEN Inject 0.3 mLs (0.3 mg total) into the muscle once as needed (anaphylaxis).   escitalopram 10 MG tablet Commonly known as:  LEXAPRO Take 10 mg by mouth daily.   flecainide 100 MG tablet Commonly known as:  TAMBOCOR Take 3 tablets by mouth at onset of fast heart rates as needed   furosemide 20 MG tablet Commonly known as:  LASIX Take 1 tablet (20 mg total) by mouth daily as needed for edema.   levothyroxine 50 MCG tablet Commonly known as:  SYNTHROID, LEVOTHROID Take 50 mcg by mouth daily before breakfast.   MULTIVITAMIN ADULT PO Take 1 capsule by mouth 2 (two) times daily. Journey 3+3 Bariatric Multivitamin   OXYGEN Inhale 3 L/min into  the lungs at bedtime. Continuously   potassium chloride SA 20 MEQ tablet Commonly known as:  K-DUR,KLOR-CON Take 1 tablet (20 mEq total) by mouth daily as needed (take when taking lasix).   traMADol 50 MG tablet Commonly known as:  ULTRAM Take 1 tablet (50 mg total) by mouth every 6 (six) hours as needed.   XARELTO 20 MG Tabs tablet Generic drug:  rivaroxaban TAKE ONE TABLET BY MOUTH ONCE DAILY WITH SUPPER          Objective:    BP (!) 147/91   Pulse 98   SpO2 91%   Allergies  Allergen  Reactions  . Mucinex [Guaifenesin Er] Anaphylaxis  . Ace Inhibitors Swelling and Cough    Pedal Edema  . Propoxyphene Hcl Other (See Comments)    REACTION: hallucinate- Darvon  . Hydromet [Hydrocodone-Homatropine] Itching and Other (See Comments)    Severe stomach pain-face itches.   . Sulfonamide Derivatives Rash    Physical Exam  Constitutional: She is oriented to person, place, and time. She appears well-developed and well-nourished.  HENT:  Head: Normocephalic and atraumatic.  Right Ear: Tympanic membrane, external ear and ear canal normal.  Left Ear: Tympanic membrane, external ear and ear canal normal.  Nose: Nose normal. No rhinorrhea.  Mouth/Throat: Oropharynx is clear and moist and mucous membranes are normal. No oropharyngeal exudate or posterior oropharyngeal erythema.  Eyes: Conjunctivae and EOM are normal. Pupils are equal, round, and reactive to light.  Neck: Normal range of motion. Neck supple.  Cardiovascular: Normal rate, regular rhythm, normal heart sounds and intact distal pulses.   Pulmonary/Chest: Effort normal. She has wheezes.  After the dosing of epinephrine there was a decrease in any wheezing or respiratory discomfort  Abdominal: Soft. Bowel sounds are normal.  Neurological: She is alert and oriented to person, place, and time. She has normal reflexes.  Skin: Skin is warm and dry. Rash noted. Rash is urticarial. There is erythema.     Diffuse redness and  swelling on trunk, arms and hands.  After the dosing of her epinephrine there was an improvement of the swelling.  Psychiatric: She has a normal mood and affect. Her behavior is normal. Judgment and thought content normal.        Assessment & Plan:   1. Anaphylaxis, initial encounter - diphenhydrAMINE (BENADRYL) injection 50 mg; Inject 1 mL (50 mg total) into the muscle once. - EPINEPHrine (EPI-PEN) injection 0.3 mg; Inject 0.3 mLs (0.3 mg total) into the muscle once. - EPINEPHrine (EPIPEN) 0.3 mg/0.3 mL IJ SOAJ injection; Inject 0.3 mLs (0.3 mg total) into the muscle once as needed (anaphylaxis).  Dispense: 1 Device; Refill: 6 - Ambulatory referral to Allergy    Continue all other maintenance medications as listed above.  Follow up plan: Follow-up as needed or worsening of symptoms. Call office for any issues. Called patient later in day and feeling very good.  Educational handout given for anaphylaxis  Terald Sleeper PA-C [facility] [address]  [place], [place] [address] [phone]   08/08/2016, 2:14 PM

## 2016-08-16 ENCOUNTER — Other Ambulatory Visit: Payer: Self-pay | Admitting: Internal Medicine

## 2016-08-22 ENCOUNTER — Telehealth: Payer: Self-pay | Admitting: Physician Assistant

## 2016-08-22 DIAGNOSIS — T7840XD Allergy, unspecified, subsequent encounter: Secondary | ICD-10-CM

## 2016-08-22 NOTE — Telephone Encounter (Signed)
Patient seen Rebekah [name] on 4/24 for anaphylaxis. Patient states that she had ate beef that night before and the morning before she seen angel. States they thought she has the meat allergy from the lonestar tick. Patient states since she seen Rebekah [name] she has pulled out 4 Lonestar ticks. Has been avoiding red meat. Has appointment with allergist 5/29. Wanting to know is Rebekah [name] can just test her for the tick bite and then if it negative go to the allergist? If so she is needing the lab code to make sure it is covered by her insurance. Please advise

## 2016-08-24 NOTE — Telephone Encounter (Signed)
Patient aware.

## 2016-08-27 ENCOUNTER — Other Ambulatory Visit: Payer: BC Managed Care – PPO

## 2016-08-27 DIAGNOSIS — T7840XD Allergy, unspecified, subsequent encounter: Secondary | ICD-10-CM

## 2016-08-28 ENCOUNTER — Encounter: Payer: Self-pay | Admitting: Physician Assistant

## 2016-08-29 LAB — ALPHA-GAL PANEL
ALPHA GAL IGE: 35.8 kU/L — AB (ref <0.35)
Beef (Bos spp) IgE: 16.9 kU/L — ABNORMAL HIGH (ref <0.35)
Class Interpretation: 3
Class Interpretation: 3
Lamb/Mutton (Ovis spp) IgE: 6.37 kU/L — ABNORMAL HIGH (ref <0.35)
PORK (SUS SPP) IGE: 9.83 kU/L — AB (ref <0.35)
PORK CLASS INTERPRETATION: 3

## 2016-09-11 ENCOUNTER — Ambulatory Visit: Payer: BC Managed Care – PPO | Admitting: Allergy and Immunology

## 2016-09-27 ENCOUNTER — Other Ambulatory Visit: Payer: Self-pay | Admitting: Internal Medicine

## 2016-09-27 ENCOUNTER — Encounter: Payer: Self-pay | Admitting: Internal Medicine

## 2016-10-01 DIAGNOSIS — Z029 Encounter for administrative examinations, unspecified: Secondary | ICD-10-CM

## 2016-10-16 ENCOUNTER — Other Ambulatory Visit: Payer: Self-pay | Admitting: *Deleted

## 2016-10-16 MED ORDER — ESCITALOPRAM OXALATE 10 MG PO TABS: 10.0000 mg | ORAL_TABLET | Freq: Every day | ORAL | 0 refills | Status: DC

## 2016-10-16 MED ORDER — LEVOTHYROXINE SODIUM 50 MCG PO TABS: 50.0000 ug | ORAL_TABLET | Freq: Every day | ORAL | 0 refills | Status: DC

## 2016-10-16 NOTE — Addendum Note (Signed)
Addended by: Thana Ates on: 10/16/2016 02:06 PM   Modules accepted: Orders

## 2016-11-05 ENCOUNTER — Other Ambulatory Visit: Payer: Self-pay | Admitting: Internal Medicine

## 2016-11-21 ENCOUNTER — Encounter: Payer: Self-pay | Admitting: Internal Medicine

## 2016-11-21 ENCOUNTER — Ambulatory Visit (INDEPENDENT_AMBULATORY_CARE_PROVIDER_SITE_OTHER): Payer: BC Managed Care – PPO | Admitting: Internal Medicine

## 2016-11-21 VITALS — BP 158/106 | HR 69 | Ht 62.0 in | Wt 271.0 lb

## 2016-11-21 DIAGNOSIS — Z9884 Bariatric surgery status: Secondary | ICD-10-CM | POA: Diagnosis not present

## 2016-11-21 DIAGNOSIS — I480 Paroxysmal atrial fibrillation: Secondary | ICD-10-CM

## 2016-11-21 DIAGNOSIS — I10 Essential (primary) hypertension: Secondary | ICD-10-CM

## 2016-11-21 MED ORDER — RIVAROXABAN 20 MG PO TABS: 20.0000 mg | ORAL_TABLET | Freq: Every day | ORAL | 3 refills | Status: DC

## 2016-11-21 MED ORDER — DILTIAZEM HCL ER COATED BEADS 120 MG PO CP24: 120.0000 mg | ORAL_CAPSULE | Freq: Every day | ORAL | 3 refills | Status: DC

## 2016-11-21 NOTE — Progress Notes (Signed)
OFFICE NOTE  Chief Complaint:  No complaints  Primary Care Physician: Terald Sleeper, PA-C  HPI:  MAGDALYNN DAVILLA is a pleasant 61 year old female with a past medical history significant for her super morbid obesity, chronic hypoxia, hypertension, GERD and prior knee replacement. She was previously followed by Dr. Rollene Fare and was being evaluated for bariatric surgery. She is here to establish new care with me today and for preoperative evaluation. 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 She appears he seen Dr. Rollene Fare will order an echocardiogram on August of 2014 which showed moderate thickening of the left ventricle with EF of 60-65%, mildly increased RV systolic pressure and no significant valvular disease. There was left atrial enlargement. She denies any chest pain but does have expected shortness of breath with exertion. Lower extremity venous Dopplers were performed which show no evidence of significant venous insufficiency.    Mrs. Twiggs returns today for follow-up. She seems to be doing remarkably well. She underwent gastric sleeve surgery and has had close to 60 pound weight loss. Unfortunately she was complicated by postoperative atrial fibrillation. She was seen by Dr. Martinique and placed on Cardizem. Subsequently she converted to sinus rhythm within 24 hours spontaneously. She's had no recurrence since that time and has not been additionally anticoagulated. She was not continued on Cardizem. She does have an elevated CHADSVASC score of 2.  I saw Mrs. Duan back in the office today. Please see her recent notes when I saw her in the emergency room at [facility]. She was noted to be in atrial fibrillation again and given flecainide to help convert her, but this was not successful. She is placed on diltiazem and in addition to metoprolol.  Rate slowed down and eventually she cardioverted on her own. We also started her on Xarelto which she's continues to take. Overall she's had very good rate control his heart rate is remained in the mid 40s to mid 60s. Blood pressure is somewhat labile and ranges between the low 90s up to the 140s. Overall she feels fairly well. She still has a few episodes of palpitations during the week when she feels that she is in A. fib although she feels like she is in A. fib today and her rhythm is sinus. She may not be sensitive to A. fib anymore. 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  I saw Mrs. Lormand back in the office today. She's had remarkable weight loss. In fact her weight after sleeve gastrectomy is down 288 pounds. Her main issue now is excess tissue for which she's tried to get plastic surgery but has been denied multiple times. She reports no further palpitations or atrial fibrillation. She continues to take Xarelto without any bleeding complications. She continues to use nocturnal oxygen due to hypoxia, although her sleep study indicated no sleep apnea, her overnight oximetry study did indicate hypoxia with a low O2 sats ration of 86%. This was, of course prior to the recent 80-100 pound weight loss. She also reports recently she's been having epistaxis. In fact she had an episode that lasted for 10-12 hours, she used a box of 40 micro-tampons to try to get it to stop and then eventually tried to go to the emergency room. She is using nasal saline but feels that the oxygen at night is probably drying  her nose out.  Geana was seen today for an urgent add-on. This morning she was awakened by her alarm clock around 5 AM and noticed that her heart was racing when she woke up. It did not slow down right away and she felt increasingly more fatigue. She said after taking a shower she felt dizzy and had some chest pressure. Her heart rate was very elevated and she tried to take it noting  it was "greater than 200". She decided then take 300 mg of flecainide. Subsequently she took 2 diltiazem's and after several hours he decided to come in for her appointment with the orthopedist. She says that on the way in to the office her heart rate slowed down and she felt better. When she saw her orthopedist they felt that she should be reevaluated by [name] here. Currently she is asymptomatic. EKG was performed showing sinus rhythm with sinus arrhythmia at 81.  Mrs. Shumard returns today for follow-up. She had been reporting some worsening leg swelling however this seems to be improved somewhat today. She has had no recurrent paroxysmal arrhythmias. She continues to want to use the pocket pill strategy.  11/21/2016  Mrs. Haughey returns a for follow-up. Is been over year since I last saw her. She was last seen in the A. fib clinic for follow-up and is reported very infrequent use of flecainide. She says that she's used it a few times over the past year and has been successful in converting her to sinus rhythm. Of most notable is that she's had significant weight gain. Weight in March 2017 was 188 pounds and now up to 271 pounds. She was 204 pounds in January. She says this is typical to many factors including immobility after foot surgery, social stressors, brother who had a stroke and poor eating habits. She has a significant understanding of nutrition and her dietary needs but has had difficulty balancing that. She reports that she is consuming only 600-700 cal a day and has lost 20 pounds but has recently plateaued.  PMHx:  Past Medical History:  Diagnosis Date  . Arthritis   . Complication of anesthesia   . Difficulty sleeping    prior sleep study did not reveal sleep apnea per patient  . DJD (degenerative joint disease)   . Dysrhythmia    a-fib  . Environmental allergies   . GERD (gastroesophageal reflux disease)   . Heart murmur   . Hypertension    no meds after weight loss  . Hypothyroidism    . Incontinence of urine    at nite   . Left atrial dilatation   . Obesity    s/p gastric sleeve 12/2013 (previously weighed close to 400 lbs)  . Paroxysmal atrial fibrillation (HCC)   . Pneumonia    hx of   . PONV (postoperative nausea and vomiting)   . Supplemental oxygen dependent    uses 2l/ at night, states HR goes low and O2 drops  . Tuberculosis    had 6 month of INH due to exposure     Past Surgical History:  Procedure Laterality Date  . APPENDECTOMY    . CHOLECYSTECTOMY    . EYE SURGERY     03/2014 lens implant left lens   . FOOT ARTHRODESIS Left 03/15/2016   Procedure: TALONAVICULAR AND SUBTALAR ARTHRODESIS;  Surgeon: Wylene Simmer, MD;  Location: [place];  Service: Orthopedics;  Laterality: Left;  Marland Kitchen GASTROC RECESSION EXTREMITY Left 03/15/2016   Procedure: LEFT GASTROC RECESSION;  Surgeon: Wylene Simmer, MD;  Location: [place];  Service: Orthopedics;  Laterality: Left;  . JOINT REPLACEMENT  1999   L TOTAL KNEE  . LAPAROSCOPIC GASTRIC SLEEVE RESECTION N/A 01/04/2014   Procedure: LAPAROSCOPIC GASTRIC SLEEVE RESECTION;  Surgeon: Greer Pickerel, MD;  [facility];  Service: General;  Laterality: N/A;  . TONSILLECTOMY    . TOTAL KNEE ARTHROPLASTY Right 05/17/2015   Procedure: RIGHT TOTAL KNEE ARTHROPLASTY;  Surgeon: Paralee Cancel, MD;  [facility];  Service: Orthopedics;  Laterality: Right;  . TRANSTHORACIC ECHOCARDIOGRAM  11/2012   EF 60-65%, mod LVH & mod conc hypertrophy, grade 1 diastolic dysfunction; LA mildly dilated; RV systolic pressure increased; PA peak pressure 30mmHg  . TUBAL LIGATION    . UPPER GI ENDOSCOPY  01/04/2014   Procedure: UPPER GI ENDOSCOPY;  Surgeon: Greer Pickerel, MD;  [facility];  Service: General;;    FAMHx:  Family History  Problem Relation Age of Onset  . Diabetes Father   . Uterine cancer Mother   . Cervical cancer Mother   . Breast cancer Mother   . Cancer Mother        cervical and brreast  cancer  . Diabetes Maternal Grandmother     SOCHx:   reports that she has never smoked. She has never used smokeless tobacco. She reports that she does not drink alcohol or use drugs.  ALLERGIES:  Allergies  Allergen Reactions  . Mucinex [Guaifenesin Er] Anaphylaxis  . Ace Inhibitors Swelling and Cough    Pedal Edema  . Propoxyphene Hcl Other (See Comments)    REACTION: hallucinate- Darvon  . Hydromet [Hydrocodone-Homatropine] Itching and Other (See Comments)    Severe stomach pain-face itches.   . Sulfonamide Derivatives Rash    ROS: Pertinent items noted in HPI and remainder of comprehensive ROS otherwise negative.  HOME MEDS: Current Outpatient Prescriptions  Medication Sig Dispense Refill  . acetaminophen (TYLENOL) 500 MG tablet Take 500-1,000 mg by mouth every 6 (six) hours as needed for moderate pain or headache.    Marland Kitchen CALCIUM PO Take 1,000 mg by mouth daily.    Marland Kitchen CARTIA XT 120 MG 24 hr capsule TAKE 1 CAPSULE BY MOUTH ONCE DAILY (CALL AND SCHEDULE APPOINTMENT) 15 capsule 0  . cetirizine (ZYRTEC) 10 MG tablet Take 10 mg by mouth at bedtime.     Marland Kitchen diltiazem (CARDIZEM) 30 MG tablet Take 1-2 tablets by mouth every 6 hours as needed for fast heart rates 30 tablet 1  . diphenhydrAMINE (BENADRYL) 50 MG tablet Take 50 mg by mouth at bedtime as needed for itching.    Marland Kitchen EPINEPHrine (EPIPEN) 0.3 mg/0.3 mL IJ SOAJ injection Inject 0.3 mLs (0.3 mg total) into the muscle once as needed (anaphylaxis). 1 Device 6  . escitalopram (LEXAPRO) 10 MG tablet Take 1 tablet (10 mg total) by mouth daily. 90 tablet 0  . flecainide (TAMBOCOR) 100 MG tablet Take 3 tablets by mouth at onset of fast heart rates as needed 30 tablet 1  . furosemide (LASIX) 20 MG tablet Take 1 tablet (20 mg total) by mouth daily as needed for edema. 30 tablet 3  . levothyroxine (SYNTHROID, LEVOTHROID) 50 MCG tablet Take 1 tablet (50 mcg total) by mouth daily before breakfast. 90 tablet 0  . Multiple Vitamins-Minerals  (MULTIVITAMIN ADULT PO) Take 1 capsule by mouth 2 (two) times daily. Journey 3+3 Bariatric Multivitamin    . OXYGEN Inhale 3 L/min into the lungs at bedtime. Continuously    .  potassium chloride SA (K-DUR,KLOR-CON) 20 MEQ tablet Take 1 tablet (20 mEq total) by mouth daily as needed (take when taking lasix). 30 tablet 3  . rivaroxaban (XARELTO) 20 MG TABS tablet Take 1 tablet (20 mg total) by mouth daily with supper. OVERDUE FOR FOLLOW UP. CALL AND SCHEDULE [phone] 90 tablet 1  . traMADol (ULTRAM) 50 MG tablet Take 1 tablet (50 mg total) by mouth every 6 (six) hours as needed. 120 tablet 5   No current facility-administered medications for this visit.     LABS/IMAGING: No results found for this or any previous visit (from the past 48 hour(s)). No results found.  VITALS: BP (!) 158/106 (BP Location: Left Arm, Patient Position: Sitting, Cuff Size: Large)   Pulse 69   Ht 5\' 2"  (1.575 m)   Wt 271 lb (122.9 kg)   BMI 49.57 kg/m   EXAM: General appearance: alert, no distress and morbidly obese Neck: no carotid bruit, no JVD and thyroid not enlarged, symmetric, no tenderness/mass/nodules Lungs: clear to auscultation bilaterally Heart: regular rate and rhythm, S1, S2 normal, no murmur, click, rub or gallop Abdomen: soft, non-tender; bowel sounds normal; no masses,  no organomegaly Extremities: No ankle edema, excess tissue and firmness around the upper calf and knee areas Pulses: 2+ and symmetric Skin: Skin color, texture, turgor normal. No rashes or lesions Neurologic: Grossly normal Psych: Pleasant  EKG: Normal sinus rhythm with sinus arrhythmia at 69, nonspecific T wave changes-personally reviewed  ASSESSMENT: 1. Paroxysmal atrial fibrillation, recurrent- on pocket pill therapy 2. Bilateral leg edema - minimal 3. Morbid obesity with significant weight gain and prior bariatric surgery 4. Uncontrolled hypertension  PLAN: 1.    Mrs. Krone denies any significant recurrent atrial  fibrillation that is not controlled with pocket pill therapy. Her CHADSVASC score is 3 and she's probably anticoagulant on Xarelto. She denies any bleeding complications. She has minimal leg edema and had normal echo findings last year. Unfortunate she's had significant weight gain and blood pressure is noted to be elevated today; she reports her blood pressure is in the 120s over 70s at home on a wrist cuff. I've encouraged her to get up arm cuff and take readings with both devices and will arrange for repeat blood pressure check in about 2 weeks. She may need additional medication for blood pressure, especially in light of recent weight gain.  Pixie Casino, MD, [facility] Attending Cardiologist [facility] 11/21/2016, 8:42 AM

## 2016-11-21 NOTE — Patient Instructions (Addendum)
Dr. Debara Pickett has requested that you schedule an appointment with one of our clinical pharmacists for a blood pressure check appointment within the next 2-3 weeks.  -- if you monitor your blood pressure (BP) at home, please bring your BP cuff and your BP readings with you to this appointment -- please check your BP no more than twice daily, after you have been sitting/resting for 5-10 minutes, at least 1 hour after taking your BP medications -- Dr. Debara Pickett recommends an ARM blood pressure cuff - Omron is a good brand  Your physician wants you to follow-up in: 3 months with Dr. Debara Pickett.

## 2016-11-26 ENCOUNTER — Encounter: Payer: Self-pay | Admitting: Internal Medicine

## 2016-11-26 DIAGNOSIS — Z79899 Other long term (current) drug therapy: Secondary | ICD-10-CM

## 2016-11-26 MED ORDER — LOSARTAN POTASSIUM 50 MG PO TABS: 50.0000 mg | ORAL_TABLET | Freq: Every day | ORAL | 3 refills | Status: DC

## 2016-11-27 ENCOUNTER — Telehealth: Payer: Self-pay | Admitting: Internal Medicine

## 2016-11-27 NOTE — Telephone Encounter (Signed)
New message  Pt call requesting to speak with RN about the medication losartan 50mg   she was prescribed on 8/13. Pt states she got a new bp cuff and the readings a very different from her use of the old cuff. Pt would like to discuss before she starts new medication. Please call back to discuss

## 2016-11-27 NOTE — Telephone Encounter (Signed)
Pt of Dr. Debara Pickett Re: BP issues/BP checks.  I spoke to patient. She notes a history of "white coat hypertension". She had historically been taking BPs at home w/ wrist cuff. Due to PCP's concern, thought it would be better to take with an upper arm cuff.  Following her visit w/ Dr. Debara Pickett on 8/8, she reported manual cuff readings to [name] (see patient e-mail encounter from yesterday). She was recommended to start losartan 50mg  daily due to high readings. I spoke w her today. She feels she is contributing to the BPs being falsely elevated on the manual cuff, d/t the difficulty of getting readings and having to adjust the cuff, inflate, and listen to these herself, etc.  She went and obtained an Omron cuff yesterday and voiced that she has only taken a couple of readings on it, yesterday and today, but that her BPs are much better, and more consistent with her wrist cuff. Cites Auto cuff reading of 125/80 last night before bed, 123/76, (wrist 130/76 by comparison). This AM Auto cuff 138/83 wrist cuff 134/77.  I agreed w patient that if she was truly having to do readings herself w manual cuff, inflating the cuff herself, it would be hard to get a truly accurate "at rest" BP reading.  Pt has NOT started the losartan yet. I advised to continue to hold the losartan until new BP trend established. Advised to continue for now w the new automatic cuff readings & to follow up as scheduled on 8/22 for BP mgmt visit w pharmD.  I informed her in interim, I would seek further advice from pharmD and follow up w her. Pt voiced she would prefer mychart msg to be sent.

## 2016-11-27 NOTE — Telephone Encounter (Signed)
Have her continue with 1-2 times daily BP checks, write all readings on paper and bring that along with both cuffs to appointment on 8/22.  Hold losartan until that time.

## 2016-11-27 NOTE — Telephone Encounter (Signed)
Pt verbalized understanding of instructions, aware to call sooner if new concerns/questions.

## 2016-12-05 ENCOUNTER — Ambulatory Visit (INDEPENDENT_AMBULATORY_CARE_PROVIDER_SITE_OTHER): Payer: BC Managed Care – PPO | Admitting: Pharmacist Clinician (PhC)/ Clinical Pharmacy Specialist

## 2016-12-05 ENCOUNTER — Encounter: Payer: Self-pay | Admitting: Pharmacist Clinician (PhC)/ Clinical Pharmacy Specialist

## 2016-12-05 DIAGNOSIS — I10 Essential (primary) hypertension: Secondary | ICD-10-CM

## 2016-12-05 NOTE — Progress Notes (Signed)
12/05/2016 Rebekah [name] Dec 02, 1955 [id number]   HPI:  Rebekah [name] is a 61 y.o. female patient of Dr Debara Pickett, with a PMH below who presents today for hypertension clinic evaluation.  Her medical history is significant for hypertension, chronic hypoxia, super morbid obesity, GERD and knee replacement.  She had a sleeve gastrectomy in 2015 and saw her weight drop from a high of 372 to 180 after 2 years.  Unfortunately she was back up to 271 at her last visit with Dr. Debara Pickett earlier this month.  Patient states most of the weight gain was due to inactivity after reconstructive foot surgery as well as caring for brother and his teenager, after brother suffered a stroke.    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   She believes she has white coat hypertension, stating it is always much better at home than at any MD office.  When she saw Dr. Debara Pickett on Aug 8 her pressure was elevated, but he recommended getting a new cuff before making any medication changes, as he believed her home cuff (a wrist model) may have been inaccurate.  She then bought a manual cuff and noted home readings to be always > 160/100, whereas her wrist cuff would read 40-60 points lower systolic.  Frustrated, she bought a [employer] electronic arm monitor, and found that it read better, but still 10-20 points higher than the wrist.  She brings all 3 into the office today with her.  When she called in the results of the manual cuff, she was prescribed losartan 50 mg daily, but did not take, as she believed the cuff was inaccurate.    Went into respiratory failure 10 + years ago, was put on O2, uses it still at night.  Overnight sat test 1 year ago.    Arthritis, joint issues, 2 knee replacements, foot recontruction surgery.  1st knee 20 years ago, was told would last up to 15 years.   Some AF this am, makes her anxious  Blood Pressure Goal:  130/80  Current Medications:  Losartan 50 mg (did not  start)  Diltiazem 120 mg qd  dilt 30 mg (pill in a pocket for AF)   Family Hx:  Father MI with DM died at 51  Brother had stroke at 63  2 kids no known health problems  Social Hx:  No tobacco, no alcohol, rare caffeine, drinks decaf tea, brews herself  Diet:  Now waiting to see bariatrician for follow up and counseling; has been trying to get back on track of eating healthier.  States she eats no more than 740 calories per day and is always hungry.  Does endorse eating out in sit down restaurants 3-4 times per week.  Of note she cannot eat beef, pork or lamb because of anaphylactic reaction   Exercise:  Hard to walk because of foot reconstruction and knee issues; heat and humidity "kill her" to be outside  Home BP readings:  Over the past week morning average 145/84 and evening average 131/77  CrCl cannot be calculated (Patient's most recent lab result is older than the maximum 21 days allowed.).  Wt Readings from Last 3 Encounters:  12/05/16 270 lb 9.6 oz (122.7 kg)  11/21/16 271 lb (122.9 kg)  04/20/16 204 lb (92.5 kg)   BP Readings from Last 3 Encounters:  12/05/16 (!) 172/110  11/21/16 (!) 158/106  08/07/16 (!) 147/91   Pulse Readings  from Last 3 Encounters:  12/05/16 64  11/21/16 69  08/07/16 98    Current Outpatient Prescriptions  Medication Sig Dispense Refill  . acetaminophen (TYLENOL) 500 MG tablet Take 500-1,000 mg by mouth every 6 (six) hours as needed for moderate pain or headache.    Marland Kitchen CALCIUM PO Take 1,000 mg by mouth daily.    . cetirizine (ZYRTEC) 10 MG tablet Take 10 mg by mouth at bedtime.     Marland Kitchen diltiazem (CARDIZEM) 30 MG tablet Take 1-2 tablets by mouth every 6 hours as needed for fast heart rates 30 tablet 1  . diltiazem (CARTIA XT) 120 MG 24 hr capsule Take 1 capsule (120 mg total) by mouth daily. 90 capsule 3  . diphenhydrAMINE (BENADRYL) 50 MG tablet Take 50 mg by mouth at bedtime as needed for itching.    Marland Kitchen EPINEPHrine (EPIPEN) 0.3 mg/0.3 mL IJ  SOAJ injection Inject 0.3 mLs (0.3 mg total) into the muscle once as needed (anaphylaxis). 1 Device 6  . escitalopram (LEXAPRO) 10 MG tablet Take 1 tablet (10 mg total) by mouth daily. 90 tablet 0  . flecainide (TAMBOCOR) 100 MG tablet Take 3 tablets by mouth at onset of fast heart rates as needed 30 tablet 1  . furosemide (LASIX) 20 MG tablet Take 1 tablet (20 mg total) by mouth daily as needed for edema. 30 tablet 3  . levothyroxine (SYNTHROID, LEVOTHROID) 50 MCG tablet Take 1 tablet (50 mcg total) by mouth daily before breakfast. 90 tablet 0  . losartan (COZAAR) 50 MG tablet Take 1 tablet (50 mg total) by mouth daily. 90 tablet 3  . Multiple Vitamins-Minerals (MULTIVITAMIN ADULT PO) Take 1 capsule by mouth 2 (two) times daily. Journey 3+3 Bariatric Multivitamin    . OXYGEN Inhale 3 L/min into the lungs at bedtime. Continuously    . potassium chloride SA (K-DUR,KLOR-CON) 20 MEQ tablet Take 1 tablet (20 mEq total) by mouth daily as needed (take when taking lasix). 30 tablet 3  . rivaroxaban (XARELTO) 20 MG TABS tablet Take 1 tablet (20 mg total) by mouth daily with supper. 90 tablet 3  . traMADol (ULTRAM) 50 MG tablet Take 1 tablet (50 mg total) by mouth every 6 (six) hours as needed. 120 tablet 5   No current facility-administered medications for this visit.     Allergies  Allergen Reactions  . Beef-Derived Products Anaphylaxis    After tick bite, cannot eat beef, pork or lamb  . Lambs Quarters Anaphylaxis    After tick bite cannot eat beef, pork or lamb  . Mucinex [Guaifenesin Er] Anaphylaxis  . Pork-Derived Products Anaphylaxis    After tick bite cannot eat beef, pork or lamb  . Ace Inhibitors Swelling and Cough    Pedal Edema  . Propoxyphene Hcl Other (See Comments)    REACTION: hallucinate- [place]  . Hydromet [Hydrocodone-Homatropine] Itching and Other (See Comments)    Severe stomach pain-face itches.   . Sulfonamide Derivatives Rash    Past Medical History:  Diagnosis Date    . Arthritis   . Complication of anesthesia   . Difficulty sleeping    prior sleep study did not reveal sleep apnea per patient  . DJD (degenerative joint disease)   . Dysrhythmia    a-fib  . Environmental allergies   . GERD (gastroesophageal reflux disease)   . Heart murmur   . Hypertension    no meds after weight loss  . Hypothyroidism   . Incontinence of urine    at  nite   . Left atrial dilatation   . Obesity    s/p gastric sleeve 12/2013 (previously weighed close to 400 lbs)  . Paroxysmal atrial fibrillation (HCC)   . Pneumonia    hx of   . PONV (postoperative nausea and vomiting)   . Supplemental oxygen dependent    uses 2l/[place] at night, states HR goes low and O2 drops  . Tuberculosis    had 6 month of INH due to exposure     Blood pressure (!) 172/110, pulse 64, weight 270 lb 9.6 oz (122.7 kg).  Essential hypertension Patient with white coat hypertension, still somewhat elevated at home, though not quite to goal.   Patient will start losartan 25 mg once daily in the evenings, as her morning pressure is the higher average.  She will continue to check home BP once to twice daily.  If after 2-3 weeks on losartan her pressures are still averaging higher than 409 systolic, will have her increase to 50 mg losartan.  She will continue to work on dietary restrictions and hopefull can get into the bariatric MD soon to help with weight loss.     Tommy Medal PharmD CPP [facility]

## 2016-12-05 NOTE — Assessment & Plan Note (Signed)
Patient with white coat hypertension, still somewhat elevated at home, though not quite to goal.   Patient will start losartan 25 mg once daily in the evenings, as her morning pressure is the higher average.  She will continue to check home BP once to twice daily.  If after 2-3 weeks on losartan her pressures are still averaging higher than 983 systolic, will have her increase to 50 mg losartan.  She will continue to work on dietary restrictions and hopefull can get into the bariatric MD soon to help with weight loss.

## 2016-12-05 NOTE — Patient Instructions (Signed)
  Your blood pressure today is 172/110   Check your blood pressure at home daily (if able) and keep record of the readings.  Take your BP meds as follows:  Take losartan 25 mg (1/2 tablet) each evening  Continue with diltiazem 120 mg each morning  Bring all of your meds, your BP cuff and your record of home blood pressures to your next appointment.  Exercise as you're able, try to walk approximately 30 minutes per day.  Keep salt intake to a minimum, especially watch canned and prepared boxed foods.  Eat more fresh fruits and vegetables and fewer canned items.  Avoid eating in fast food restaurants.    HOW TO TAKE YOUR BLOOD PRESSURE: . Rest 5 minutes before taking your blood pressure. .  Don't smoke or drink caffeinated beverages for at least 30 minutes before. . Take your blood pressure before (not after) you eat. . Sit comfortably with your back supported and both feet on the floor (don't cross your legs). . Elevate your arm to heart level on a table or a desk. . Use the proper sized cuff. It should fit smoothly and snugly around your bare upper arm. There should be enough room to slip a fingertip under the cuff. The bottom edge of the cuff should be 1 inch above the crease of the elbow. . Ideally, take 3 measurements at one sitting and record the average.

## 2016-12-21 ENCOUNTER — Telehealth: Payer: Self-pay | Admitting: Internal Medicine

## 2016-12-21 MED ORDER — LOSARTAN POTASSIUM 50 MG PO TABS: ORAL_TABLET | ORAL | 1 refills | Status: DC

## 2016-12-21 NOTE — Telephone Encounter (Signed)
Patient now on losartan 50 mg qhs in addition to diltiazem 120 mg qam.  Home morning BP readings still running mostly in 940'H systolic, however evening readings much improved.  Will have patient increase losartan to 25 mg qam and 50 mg qpm.  She can contact us in 10-14 days with updated results

## 2016-12-21 NOTE — Telephone Encounter (Signed)
New message    Pt c/o medication issue:  1. Name of Medication: losartan (COZAAR) 50 MG tablet  2. How are you currently taking this medication (dosage and times per day)? 50mg   3. Are you having a reaction (difficulty breathing--STAT)? no  4. What is your medication issue? Bp stays on 50 for alittle more than a week and was told to call us back and let us know if it is. Morning: 150/82 to 157/91, Night: 109/59 to 132/73.

## 2016-12-25 ENCOUNTER — Encounter (INDEPENDENT_AMBULATORY_CARE_PROVIDER_SITE_OTHER): Payer: BC Managed Care – PPO

## 2017-01-01 ENCOUNTER — Ambulatory Visit (INDEPENDENT_AMBULATORY_CARE_PROVIDER_SITE_OTHER): Payer: BC Managed Care – PPO | Admitting: Family Medicine

## 2017-01-01 ENCOUNTER — Encounter (INDEPENDENT_AMBULATORY_CARE_PROVIDER_SITE_OTHER): Payer: Self-pay | Admitting: Family Medicine

## 2017-01-01 VITALS — BP 138/91 | HR 71 | Temp 98.4°F | Ht 61.0 in | Wt 267.0 lb

## 2017-01-01 DIAGNOSIS — Z6841 Body Mass Index (BMI) 40.0 and over, adult: Secondary | ICD-10-CM | POA: Diagnosis not present

## 2017-01-01 DIAGNOSIS — Z1389 Encounter for screening for other disorder: Secondary | ICD-10-CM

## 2017-01-01 DIAGNOSIS — I482 Chronic atrial fibrillation, unspecified: Secondary | ICD-10-CM | POA: Insufficient documentation

## 2017-01-01 DIAGNOSIS — I10 Essential (primary) hypertension: Secondary | ICD-10-CM

## 2017-01-01 DIAGNOSIS — R0602 Shortness of breath: Secondary | ICD-10-CM | POA: Insufficient documentation

## 2017-01-01 DIAGNOSIS — Z1331 Encounter for screening for depression: Secondary | ICD-10-CM

## 2017-01-01 DIAGNOSIS — E669 Obesity, unspecified: Secondary | ICD-10-CM | POA: Diagnosis not present

## 2017-01-01 DIAGNOSIS — R5383 Other fatigue: Secondary | ICD-10-CM | POA: Diagnosis not present

## 2017-01-01 DIAGNOSIS — E038 Other specified hypothyroidism: Secondary | ICD-10-CM | POA: Diagnosis not present

## 2017-01-01 DIAGNOSIS — IMO0001 Reserved for inherently not codable concepts without codable children: Secondary | ICD-10-CM

## 2017-01-01 DIAGNOSIS — Z0289 Encounter for other administrative examinations: Secondary | ICD-10-CM

## 2017-01-01 NOTE — Progress Notes (Signed)
**Note Rebekah [name]-Identified via Obfuscation** Office: [phone]  /  Fax: [phone]   Dear Dr. Redmond Pulling,   Thank you for referring Rebekah [name] to our clinic. The following note includes my evaluation and treatment recommendations.  HPI:   Chief Complaint: OBESITY    Rebekah [name] has been referred by Rebekah [name]. Redmond Pulling, MD for consultation regarding her obesity and obesity related comorbidities.    Rebekah [name] (MR# [id number]) is a 61 y.o. female who presents on 01/01/2017 for obesity evaluation and treatment. Current BMI is Body mass index is 50.45 kg/m.Marland Kitchen Rebekah [name] had Gastric Sleeve in 2015. Her heaviest preoperative weight was 372 lbs and she lost down to 179 lbs ( for 1 day, mostly held in the 190's) within 9 to 10 months. Rebekah [name] kept this weight off for about 24 months before starting to regain weight and has now regained 105 lbs from their lowest postoperative weight.   Rebekah [name] states that if she eats more than 700 calories a day, she cannot lose weight and that she has worked with registered [facility] in the past.     Rebekah [name] attended our information session and states she is currently in the action stage of change and ready to dedicate time achieving and maintaining a healthier weight. Rebekah [name] requests to join our [facility] program to help manage their weight and relearn how to use their weight loss surgery to achieve improved health.    Rebekah [name] states her family eats meals together she thinks her family will eat healthier with  her she struggles with family and or coworkers weight loss sabotage her desired weight is 150 to 170 she has been heavy most of  her life she started gaining weight as a toddler her heaviest weight ever was 411 lbs. she has significant food cravings issues  she skips meals frequently she has binge eating behaviors she struggles with emotional eating    Rebekah [name] feels her energy is lower than it should be. This has worsened with weight gain and has not worsened recently. Rebekah [name] admits to daytime somnolence  and admits to waking up still tired. Patient is at risk for obstructive sleep apnea. Patent has a history of symptoms of daytime Rebekah. Patient generally gets 7 hours of sleep per night, and states they generally have restful sleep. Snoring is present. Apneic episodes are present. Epworth Sleepiness Score is 3  Dyspnea on exertion Rebekah [name] notes increasing shortness of breath with exercising and seems to be worsening over time with weight gain. She notes getting out of breath sooner with activity than she used to. This has not gotten worse recently. Rebekah [name] denies orthopnea.  Hypertension Rebekah [name] is a 61 y.o. female with hypertension. Her blood pressure is elevated today at 138/91 on multiple antihypertensive medications. She uses O2 at night for non sleep apnea hypoxemia. Rebekah [name] denies chest pain or headache. She is working weight loss to help control her blood pressure with the goal of decreasing her risk of heart attack and stroke. Rebekah [name] blood pressure is not currently controlled.  Hypothyroid Rebekah [name] has a diagnosis of hypothyroidism. She is on synthroid. She denies hot or cold intolerance or palpitations, but does admit to ongoing Rebekah.  Atrial Fibrillation Rebekah [name] is followed by Cardiology. She is currently taking Xarelto and she denies chest pain or any recent tachycardia episodes.   Depression Screen Rebekah [name] Food and Mood (modified PHQ-9) score was  Depression screen PHQ 2/9 01/01/2017  Decreased Interest 2  Down, Depressed, Hopeless 1  PHQ - 2 Score 3  Altered sleeping 0  Tired, decreased energy 2  Change in appetite 1  Feeling bad or failure about yourself  3  Trouble concentrating 0  Moving slowly or fidgety/restless 3  Suicidal thoughts 0  PHQ-9 Score 12  Difficult doing work/chores Very difficult    ALLERGIES: Allergies  Allergen Reactions  . Beef-Derived Products Anaphylaxis    After tick bite, cannot eat beef, pork or lamb  . Lambs Quarters Anaphylaxis    After  tick bite cannot eat beef, pork or lamb  . Mucinex [Guaifenesin Er] Anaphylaxis  . Pork-Derived Products Anaphylaxis    After tick bite cannot eat beef, pork or lamb  . Darvon [Propoxyphene] Other (See Comments)    Hallucinations   . Ace Inhibitors Swelling and Cough    Pedal Edema  . Propoxyphene Hcl Other (See Comments)    REACTION: hallucinate- Darvon  . Hydromet [Hydrocodone-Homatropine] Itching and Other (See Comments)    Severe stomach pain-face itches.   . Sulfonamide Derivatives Rash    MEDICATIONS: Current Outpatient Prescriptions on File Prior to Visit  Medication Sig Dispense Refill  . acetaminophen (TYLENOL) 500 MG tablet Take 500-1,000 mg by mouth every 6 (six) hours as needed for moderate pain or headache.    Marland Kitchen CALCIUM PO Take 1,000 mg by mouth daily.    . cetirizine (ZYRTEC) 10 MG tablet Take 10 mg by mouth at bedtime.     Marland Kitchen diltiazem (CARDIZEM) 30 MG tablet Take 1-2 tablets by mouth every 6 hours as needed for fast heart rates 30 tablet 1  . diltiazem (CARTIA XT) 120 MG 24 hr capsule Take 1 capsule (120 mg total) by mouth daily. 90 capsule 3  . diphenhydrAMINE (BENADRYL) 50 MG tablet Take 50 mg by mouth at bedtime as needed for itching.    Marland Kitchen EPINEPHrine (EPIPEN) 0.3 mg/0.3 mL IJ SOAJ injection Inject 0.3 mLs (0.3 mg total) into the muscle once as needed (anaphylaxis). 1 Device 6  . escitalopram (LEXAPRO) 10 MG tablet Take 1 tablet (10 mg total) by mouth daily. 90 tablet 0  . flecainide (TAMBOCOR) 100 MG tablet Take 3 tablets by mouth at onset of fast heart rates as needed 30 tablet 1  . furosemide (LASIX) 20 MG tablet Take 1 tablet (20 mg total) by mouth daily as needed for edema. 30 tablet 3  . levothyroxine (SYNTHROID, LEVOTHROID) 50 MCG tablet Take 1 tablet (50 mcg total) by mouth daily before breakfast. 90 tablet 0  . losartan (COZAAR) 50 MG tablet Take 1/2 tablet by mouth each morning and 1 tablet each evening 135 tablet 1  . Multiple Vitamins-Minerals  (MULTIVITAMIN ADULT PO) Take 1 capsule by mouth 2 (two) times daily. Journey 3+3 Bariatric Multivitamin    . OXYGEN Inhale 3 L/min into the lungs at bedtime. Continuously    . potassium chloride SA (K-DUR,KLOR-CON) 20 MEQ tablet Take 1 tablet (20 mEq total) by mouth daily as needed (take when taking lasix). 30 tablet 3  . rivaroxaban (XARELTO) 20 MG TABS tablet Take 1 tablet (20 mg total) by mouth daily with supper. 90 tablet 3  . traMADol (ULTRAM) 50 MG tablet Take 1 tablet (50 mg total) by mouth every 6 (six) hours as needed. 120 tablet 5   No current facility-administered medications on file prior to visit.     PAST MEDICAL HISTORY: Past Medical History:  Diagnosis Date  . Anxiety   . Arthritis   . Bilateral bunions   . Complication of anesthesia   . Difficulty sleeping  prior sleep study did not reveal sleep apnea per patient  . DJD (degenerative joint disease)   . Dyspnea   . Dysrhythmia    a-fib  . Environmental allergies   . Food allergy   . Gallbladder problem   . GERD (gastroesophageal reflux disease)   . Heart murmur   . Hypertension    no meds after weight loss  . Hypothyroidism   . Incontinence of urine    at nite   . Joint pain   . Left atrial dilatation   . Left foot pain   . Leg edema   . Obesity    s/p gastric sleeve 12/2013 (previously weighed close to 400 lbs)  . Osteoarthritis   . Palpitations   . Paroxysmal atrial fibrillation (HCC)   . Pneumonia    hx of   . PONV (postoperative nausea and vomiting)   . Status post bilateral knee replacements   . Supplemental oxygen dependent    uses 2l/[place] at night, states HR goes low and O2 drops  . Tuberculosis    had 6 month of INH due to exposure   . Vaginal vault prolapse     PAST SURGICAL HISTORY: Past Surgical History:  Procedure Laterality Date  . APPENDECTOMY    . CHOLECYSTECTOMY    . EYE SURGERY     03/2014 lens implant left lens   . FOOT ARTHRODESIS Left 03/15/2016   Procedure: TALONAVICULAR  AND SUBTALAR ARTHRODESIS;  Surgeon: Wylene Simmer, MD;  Location: [facility];  Service: Orthopedics;  Laterality: Left;  Marland Kitchen GASTROC RECESSION EXTREMITY Left 03/15/2016   Procedure: LEFT GASTROC RECESSION;  Surgeon: Wylene Simmer, MD;  Location: [facility];  Service: Orthopedics;  Laterality: Left;  . JOINT REPLACEMENT  1999   L TOTAL KNEE  . LAPAROSCOPIC GASTRIC SLEEVE RESECTION N/A 01/04/2014   Procedure: LAPAROSCOPIC GASTRIC SLEEVE RESECTION;  Surgeon: Greer Pickerel, MD;  Location: [facility];  Service: General;  Laterality: N/A;  . TONSILLECTOMY    . TOTAL KNEE ARTHROPLASTY Right 05/17/2015   Procedure: RIGHT TOTAL KNEE ARTHROPLASTY;  Surgeon: Paralee Cancel, MD;  Location: [facility];  Service: Orthopedics;  Laterality: Right;  . TRANSTHORACIC ECHOCARDIOGRAM  11/2012   EF 60-65%, mod LVH & mod conc hypertrophy, grade 1 diastolic dysfunction; LA mildly dilated; RV systolic pressure increased; PA peak pressure 37mmHg  . TUBAL LIGATION    . UPPER GI ENDOSCOPY  01/04/2014   Procedure: UPPER GI ENDOSCOPY;  Surgeon: Greer Pickerel, MD;  Location: [facility];  Service: General;;    SOCIAL HISTORY: Social History  Substance Use Topics  . Smoking status: Never Smoker  . Smokeless tobacco: Never Used  . Alcohol use No    FAMILY HISTORY: Family History  Problem Relation Age of Onset  . Diabetes Father   . Hyperlipidemia Father   . Hypertension Father   . Heart disease Father   . Sudden death Father   . Anxiety disorder Father   . Obesity Father   . Uterine cancer Mother   . Cervical cancer Mother   . Breast cancer Mother   . Cancer Mother        cervical and brreast cancer  . Hypertension Mother   . Hyperlipidemia Mother   . Obesity Mother   . Diabetes Maternal Grandmother     ROS: Review of Systems  Constitutional: Positive for malaise/Rebekah.  Eyes:       Wear Glasses or Contacts  Respiratory: Positive for shortness of breath (with activity).  Cardiovascular:  Positive for palpitations. Negative for chest pain and orthopnea.       Negative tachycardia  Musculoskeletal:       Muscle or Joint Pain Muscle Stiffness Red or Swollen Joints  Skin:       Dryness  Neurological: Negative for headaches.  Endo/Heme/Allergies: Bruises/bleeds easily (easy bruising).       Negative Heat/Cold Intolerance  Psychiatric/Behavioral: The patient has insomnia.        Stress    PHYSICAL EXAM: Blood pressure (!) 138/91, pulse 71, temperature 98.4 F (36.9 C), temperature source Oral, height 5\' 1"  (1.549 m), weight 267 lb (121.1 kg), SpO2 98 %. Body mass index is 50.45 kg/m. Physical Exam  Constitutional: She is oriented to person, place, and time. She appears well-developed and well-nourished.  Cardiovascular: Normal rate.   Pulmonary/Chest: Effort normal.  Musculoskeletal: Normal range of motion.  Neurological: She is oriented to person, place, and time.  Skin: Skin is warm and dry.  Psychiatric: She has a normal mood and affect. Her behavior is normal.  Vitals reviewed.   RECENT LABS AND TESTS: BMET    Component Value Date/Time   NA 141 05/18/2015 0456   K 4.1 05/18/2015 0456   CL 106 05/18/2015 0456   CO2 27 05/18/2015 0456   GLUCOSE 141 (H) 05/18/2015 0456   BUN 21 (H) 05/18/2015 0456   CREATININE 0.73 05/18/2015 0456   CALCIUM 9.1 05/18/2015 0456   GFRNONAA >60 05/18/2015 0456   GFRAA >60 05/18/2015 0456   Lab Results  Component Value Date   HGBA1C 6.1 (H) 07/02/2013   No results found for: INSULIN CBC    Component Value Date/Time   WBC 8.0 05/18/2015 0456   RBC 3.29 (L) 05/18/2015 0456   HGB 9.6 (L) 05/18/2015 0456   HCT 29.7 (L) 05/18/2015 0456   PLT 117 (L) 05/18/2015 0456   MCV 90.3 05/18/2015 0456   MCH 29.2 05/18/2015 0456   MCHC 32.3 05/18/2015 0456   RDW 13.4 05/18/2015 0456   LYMPHSABS 2.8 03/26/2015 0316   MONOABS 0.8 03/26/2015 0316   EOSABS 0.3 03/26/2015 0316   BASOSABS 0.1 03/26/2015 0316   Iron/TIBC/Ferritin/  %Sat No results found for: IRON, TIBC, FERRITIN, IRONPCTSAT Lipid Panel     Component Value Date/Time   CHOL 223 (H) 07/02/2013 1301   TRIG 214 (H) 07/02/2013 1301   HDL 53 07/02/2013 1301   CHOLHDL 4.2 07/02/2013 1301   VLDL 43 (H) 07/02/2013 1301   LDLCALC 127 (H) 07/02/2013 1301   Hepatic Function Panel     Component Value Date/Time   PROT 7.4 01/05/2014 0304   ALBUMIN 3.6 01/05/2014 0304   AST 86 (H) 01/05/2014 0304   ALT 86 (H) 01/05/2014 0304   ALKPHOS 145 (H) 01/05/2014 0304   BILITOT 0.2 (L) 01/05/2014 0304      Component Value Date/Time   TSH 1.523 07/20/2014 1546   TSH 1.100 01/04/2014 2100   TSH 5.902 (H) 07/02/2013 1151    ECG  shows NSR with a rate of 69 BPM INDIRECT CALORIMETER done today shows a VO2 of 240 and a REE of 1670. Her calculated basal metabolic rate is 9629 thus her basal metabolic rate is better than expected.    ASSESSMENT AND PLAN: Other Rebekah - Plan: Vitamin B12, CBC With Differential, Comprehensive metabolic panel, Folate, Hemoglobin A1c, Insulin, random, Lipid Panel With LDL/HDL Ratio, VITAMIN D 25 Hydroxy (Vit-D Deficiency, Fractures)  Shortness of breath on exertion  Essential hypertension  Other specified hypothyroidism -  Plan: T3, T4, free, TSH  Chronic atrial fibrillation (HCC)  Depression screening  Class 3 obesity with serious comorbidity and body mass index (BMI) of 50.0 to 59.9 in adult, unspecified obesity type [facility])  PLAN: Rebekah Rebekah [name] was informed that her Rebekah may be related to obesity, depression or many other causes. Labs will be ordered, and in the meanwhile Lesbia has agreed to work on diet, exercise and weight loss to help with Rebekah. Proper sleep hygiene was discussed including the need for 7-8 hours of quality sleep each night. A sleep study was not ordered based on symptoms and Epworth score.  Dyspnea on exertion Rebekah [name] shortness of breath appears to be obesity related and exercise induced. She has  agreed to work on weight loss and gradually increase exercise to treat her exercise induced shortness of breath. If Maile follows our instructions and loses weight without improvement of her shortness of breath, we will plan to refer to pulmonology. We will monitor this condition regularly. Mystery agrees to this plan.  Hypertension We discussed sodium restriction, working on healthy weight loss, and a regular exercise program as the means to achieve improved blood pressure control. Rebekah [name] agreed with this plan and agreed to follow up as directed. We will continue to monitor her blood pressure as well as her progress with the above lifestyle modifications. She will continue her medications as prescribed and will watch for signs of hypotension as she continues her lifestyle modifications.  Hypothyroid Rebekah [name] was informed of the importance of good thyroid control to help with weight loss efforts. She was also informed that supertheraputic thyroid levels are dangerous and will not improve weight loss results. Deztiny agrees to follow up with our clinic in 2 weeks.  Atrial Fibrillation Caisley will continue with diet, exercise and weight loss and will make sure to lose weight at a slow and steady pace. Regine agrees to follow up with our clinic in 2 weeks.  Depression Screen Iley had a moderately positive depression screening. Depression is commonly associated with obesity and often results in emotional eating behaviors. We will monitor this closely and work on CBT to help improve the non-hunger eating patterns. Referral to Psychology may be required if no improvement is seen as she continues in our clinic.  Obesity Shephanie is currently in the action stage of change and her goal is to continue with weight loss efforts. I recommend Janyce begin the structured treatment plan as follows:  She has agreed to keep a food journal with 1100 to 1200 calories and 70+ grams of protein daily Kamoria has been instructed to eventually work  up to a goal of 150 minutes of combined cardio and strengthening exercise per week for weight loss and overall health benefits. We discussed the following Behavioral Modification Strategies today: keep a strict food journal, increasing lean protein intake and decreasing simple carbohydrates   Herminia has agreed to join our [employer] and follow up with our clinic in 2 weeks. She was informed of the importance of frequent follow up visits to maximize her success with intensive lifestyle modifications for her multiple health conditions. She was informed we would discuss her lab results at her next visit unless there is a critical issue that needs to be addressed sooner. Dariella agreed to keep her next visit at the agreed upon time to discuss these results.  I, Doreene Nest, am acting as transcriptionist for Dennard Nip, MD  I have reviewed the above documentation for accuracy and completeness, and I agree  with the above. -Dennard Nip, MD   OBESITY BEHAVIORAL INTERVENTION VISIT  Today's visit was # 1 out of 22.  Starting weight: 267 lbs Starting date: 01/01/17 Today's weight : 267 lbs Today's date: 01/01/2017 Total lbs lost to date: 0 (Patients must lose 7 lbs in the first 6 months to continue with counseling)   ASK: We discussed the diagnosis of obesity with [name] today and Alejandria agreed to give [name] permission to discuss obesity behavioral modification therapy today.  ASSESS: Chiffon has the diagnosis of obesity and her BMI today is 50.48 Alysson is in the action stage of change   ADVISE: Leylanie was educated on the multiple health risks of obesity as well as the benefit of weight loss to improve her health. She was advised of the need for long term treatment and the importance of lifestyle modifications.  AGREE: Multiple dietary modification options and treatment options were discussed and  Oralia agreed to keep a food journal with 1100 to 1200 calories and 70+ grams of protein  daily We discussed the following Behavioral Modification Strategies today: keep a strict food journal, increasing lean protein intake and decreasing simple carbohydrates

## 2017-01-02 ENCOUNTER — Telehealth: Payer: Self-pay | Admitting: Physician Assistant

## 2017-01-02 LAB — CBC WITH DIFFERENTIAL
BASOS: 1 %
Basophils Absolute: 0.1 10*3/uL (ref 0.0–0.2)
EOS (ABSOLUTE): 0.2 10*3/uL (ref 0.0–0.4)
EOS: 3 %
HEMATOCRIT: 42.6 % (ref 34.0–46.6)
Hemoglobin: 13.6 g/dL (ref 11.1–15.9)
Immature Grans (Abs): 0 10*3/uL (ref 0.0–0.1)
Immature Granulocytes: 0 %
LYMPHS ABS: 1.9 10*3/uL (ref 0.7–3.1)
Lymphs: 25 %
MCH: 27.7 pg (ref 26.6–33.0)
MCHC: 31.9 g/dL (ref 31.5–35.7)
MCV: 87 fL (ref 79–97)
MONOS ABS: 0.5 10*3/uL (ref 0.1–0.9)
Monocytes: 6 %
NEUTROS ABS: 5 10*3/uL (ref 1.4–7.0)
NEUTROS PCT: 65 %
RBC: 4.91 x10E6/uL (ref 3.77–5.28)
RDW: 14.7 % (ref 12.3–15.4)
WBC: 7.6 10*3/uL (ref 3.4–10.8)

## 2017-01-02 LAB — COMPREHENSIVE METABOLIC PANEL
ALBUMIN: 4.5 g/dL (ref 3.6–4.8)
ALT: 22 IU/L (ref 0–32)
AST: 25 IU/L (ref 0–40)
Albumin/Globulin Ratio: 1.4 (ref 1.2–2.2)
Alkaline Phosphatase: 84 IU/L (ref 39–117)
BUN / CREAT RATIO: 20 (ref 12–28)
BUN: 15 mg/dL (ref 8–27)
Bilirubin Total: 0.4 mg/dL (ref 0.0–1.2)
CALCIUM: 9.7 mg/dL (ref 8.7–10.3)
CO2: 26 mmol/L (ref 20–29)
Chloride: 101 mmol/L (ref 96–106)
Creatinine, Ser: 0.75 mg/dL (ref 0.57–1.00)
GFR, EST AFRICAN AMERICAN: 99 mL/min/{1.73_m2} (ref >59)
GFR, EST NON AFRICAN AMERICAN: 86 mL/min/{1.73_m2} (ref >59)
GLOBULIN, TOTAL: 3.2 g/dL (ref 1.5–4.5)
Glucose: 82 mg/dL (ref 65–99)
POTASSIUM: 4.2 mmol/L (ref 3.5–5.2)
Sodium: 144 mmol/L (ref 134–144)
TOTAL PROTEIN: 7.7 g/dL (ref 6.0–8.5)

## 2017-01-02 LAB — LIPID PANEL WITH LDL/HDL RATIO
Cholesterol, Total: 207 mg/dL — ABNORMAL HIGH (ref 100–199)
HDL: 73 mg/dL (ref >39)
LDL Calculated: 106 mg/dL — ABNORMAL HIGH (ref 0–99)
LDL/HDL RATIO: 1.5 ratio (ref 0.0–3.2)
TRIGLYCERIDES: 139 mg/dL (ref 0–149)
VLDL CHOLESTEROL CAL: 28 mg/dL (ref 5–40)

## 2017-01-02 LAB — INSULIN, RANDOM: INSULIN: 9.8 u[IU]/mL (ref 2.6–24.9)

## 2017-01-02 LAB — T4, FREE: Free T4: 1.19 ng/dL (ref 0.82–1.77)

## 2017-01-02 LAB — VITAMIN B12: Vitamin B-12: 836 pg/mL (ref 232–1245)

## 2017-01-02 LAB — VITAMIN D 25 HYDROXY (VIT D DEFICIENCY, FRACTURES): Vit D, 25-Hydroxy: 25.6 ng/mL — ABNORMAL LOW (ref 30.0–100.0)

## 2017-01-02 LAB — FOLATE: Folate: >20 ng/mL (ref >3.0)

## 2017-01-02 LAB — T3: T3, Total: 117 ng/dL (ref 71–180)

## 2017-01-02 LAB — TSH: TSH: 2.35 u[IU]/mL (ref 0.450–4.500)

## 2017-01-02 LAB — HEMOGLOBIN A1C
ESTIMATED AVERAGE GLUCOSE: 123 mg/dL
Hgb A1c MFr Bld: 5.9 % — ABNORMAL HIGH (ref 4.8–5.6)

## 2017-01-02 NOTE — Telephone Encounter (Signed)
Left message to make mammo appt

## 2017-01-08 ENCOUNTER — Ambulatory Visit (INDEPENDENT_AMBULATORY_CARE_PROVIDER_SITE_OTHER): Payer: BC Managed Care – PPO | Admitting: Family Medicine

## 2017-01-08 VITALS — BP 143/89 | HR 71 | Temp 98.8°F | Ht 61.0 in | Wt 265.0 lb

## 2017-01-08 DIAGNOSIS — Z9884 Bariatric surgery status: Secondary | ICD-10-CM

## 2017-01-08 DIAGNOSIS — Z6841 Body Mass Index (BMI) 40.0 and over, adult: Secondary | ICD-10-CM

## 2017-01-08 DIAGNOSIS — I10 Essential (primary) hypertension: Secondary | ICD-10-CM | POA: Diagnosis not present

## 2017-01-08 DIAGNOSIS — E669 Obesity, unspecified: Secondary | ICD-10-CM | POA: Diagnosis not present

## 2017-01-08 DIAGNOSIS — E559 Vitamin D deficiency, unspecified: Secondary | ICD-10-CM

## 2017-01-08 DIAGNOSIS — R7303 Prediabetes: Secondary | ICD-10-CM | POA: Diagnosis not present

## 2017-01-08 DIAGNOSIS — IMO0001 Reserved for inherently not codable concepts without codable children: Secondary | ICD-10-CM

## 2017-01-08 MED ORDER — VITAMIN D (ERGOCALCIFEROL) 1.25 MG (50000 UNIT) PO CAPS: 50000.0000 [IU] | ORAL_CAPSULE | ORAL | 1 refills | Status: DC

## 2017-01-08 NOTE — Progress Notes (Signed)
Office: [phone]  /  Fax: [phone]   HPI:   Chief Complaint: OBESITY Rebekah [name] is here to discuss her progress with her obesity treatment plan. She is on the keep a food journal with 1100 to 1200 calories and 70+ grams of protein daily and is following her eating plan approximately 100 % of the time. She states she is exercising 0 minutes 0 times per week. Rebekah [name] is status post Gastric Sleeve Rebekah [name] has journaled well but she struggled to eat all her protein. She had a difficult time eating all her calories and only ate approximately 50% of a portion at lunch. Her weight is 265 lb (120.2 kg) today and has had a weight loss of 2 pounds over a period of 1 week since her last visit. She has lost 2 lbs since starting treatment with [name].  Vitamin D deficiency Rebekah [name] has a diagnosis of vitamin D deficiency. She is currently on a multi vitamin and is not yet at goal. She admits fatigue and denies nausea, vomiting or muscle weakness.  Pre-Diabetes Rebekah [name] has a diagnosis of pre-diabetes based on her elevated Hgb A1c at 5.9 and was informed this puts her at greater risk of developing diabetes. She is not taking metformin currently and continues to work on diet and exercise to decrease risk of diabetes. She denies nausea, vomiting, polyphagia or hypoglycemia.  Hypertension Rebekah [name] is a 61 y.o. female with hypertension. Her blood pressure is elevated again at 143/89 and Rebekah [name] denies chest pain, headache or shortness of breath on exertion. Rebekah [name] has a history of Afib and is on multiple medications. She is working weight loss to help control her blood pressure with the goal of decreasing her risk of heart attack and stroke. Rebekah [name] blood pressure is not currently controlled.   ALLERGIES: Allergies  Allergen Reactions   Beef-Derived Products Anaphylaxis    After tick bite, cannot eat beef, pork or lamb   Lambs Quarters Anaphylaxis    After tick bite cannot eat beef, pork or lamb   Mucinex  [Guaifenesin Er] Anaphylaxis   Pork-Derived Products Anaphylaxis    After tick bite cannot eat beef, pork or lamb   Darvon [Propoxyphene] Other (See Comments)    Hallucinations    Ace Inhibitors Swelling and Cough    Pedal Edema   Propoxyphene Hcl Other (See Comments)    REACTION: hallucinate- Darvon   Hydromet [Hydrocodone-Homatropine] Itching and Other (See Comments)    Severe stomach pain-face itches.    Sulfonamide Derivatives Rash    MEDICATIONS: Current Outpatient Prescriptions on File Prior to Visit  Medication Sig Dispense Refill   acetaminophen (TYLENOL) 500 MG tablet Take 500-1,000 mg by mouth every 6 (six) hours as needed for moderate pain or headache.     CALCIUM PO Take 1,000 mg by mouth daily.     cetirizine (ZYRTEC) 10 MG tablet Take 10 mg by mouth at bedtime.      diltiazem (CARDIZEM) 30 MG tablet Take 1-2 tablets by mouth every 6 hours as needed for fast heart rates 30 tablet 1   diltiazem (CARTIA XT) 120 MG 24 hr capsule Take 1 capsule (120 mg total) by mouth daily. 90 capsule 3   diphenhydrAMINE (BENADRYL) 50 MG tablet Take 50 mg by mouth at bedtime as needed for itching.     EPINEPHrine (EPIPEN) 0.3 mg/0.3 mL IJ SOAJ injection Inject 0.3 mLs (0.3 mg total) into the muscle once as needed (anaphylaxis). 1 Device 6   escitalopram (LEXAPRO) 10  MG tablet Take 1 tablet (10 mg total) by mouth daily. 90 tablet 0   flecainide (TAMBOCOR) 100 MG tablet Take 3 tablets by mouth at onset of fast heart rates as needed 30 tablet 1   furosemide (LASIX) 20 MG tablet Take 1 tablet (20 mg total) by mouth daily as needed for edema. 30 tablet 3   levothyroxine (SYNTHROID, LEVOTHROID) 50 MCG tablet Take 1 tablet (50 mcg total) by mouth daily before breakfast. 90 tablet 0   losartan (COZAAR) 50 MG tablet Take 1/2 tablet by mouth each morning and 1 tablet each evening 135 tablet 1   Multiple Vitamins-Minerals (MULTIVITAMIN ADULT PO) Take 1 capsule by mouth 2 (two) times  daily. Journey 3+3 Bariatric Multivitamin     OXYGEN Inhale 3 L/min into the lungs at bedtime. Continuously     potassium chloride SA (K-DUR,KLOR-CON) 20 MEQ tablet Take 1 tablet (20 mEq total) by mouth daily as needed (take when taking lasix). 30 tablet 3   rivaroxaban (XARELTO) 20 MG TABS tablet Take 1 tablet (20 mg total) by mouth daily with supper. 90 tablet 3   traMADol (ULTRAM) 50 MG tablet Take 1 tablet (50 mg total) by mouth every 6 (six) hours as needed. 120 tablet 5   No current facility-administered medications on file prior to visit.     PAST MEDICAL HISTORY: Past Medical History:  Diagnosis Date   Anxiety    Arthritis    Bilateral bunions    Complication of anesthesia    Difficulty sleeping    prior sleep study did not reveal sleep apnea per patient   DJD (degenerative joint disease)    Dyspnea    Dysrhythmia    a-fib   Environmental allergies    Food allergy    Gallbladder problem    GERD (gastroesophageal reflux disease)    Heart murmur    Hypertension    no meds after weight loss   Hypothyroidism    Incontinence of urine    at nite    Joint pain    Left atrial dilatation    Left foot pain    Leg edema    Obesity    s/p gastric sleeve 12/2013 (previously weighed close to 400 lbs)   Osteoarthritis    Palpitations    Paroxysmal atrial fibrillation (HCC)    Pneumonia    hx of    PONV (postoperative nausea and vomiting)    Status post bilateral knee replacements    Supplemental oxygen dependent    uses 2l/[place] at night, states HR goes low and O2 drops   Tuberculosis    had 6 month of INH due to exposure    Vaginal vault prolapse     PAST SURGICAL HISTORY: Past Surgical History:  Procedure Laterality Date   APPENDECTOMY     CHOLECYSTECTOMY     EYE SURGERY     03/2014 lens implant left lens    FOOT ARTHRODESIS Left 03/15/2016   Procedure: TALONAVICULAR AND SUBTALAR ARTHRODESIS;  Surgeon: Wylene Simmer, MD;   Location: [place];  Service: Orthopedics;  Laterality: Left;   GASTROC RECESSION EXTREMITY Left 03/15/2016   Procedure: LEFT GASTROC RECESSION;  Surgeon: Wylene Simmer, MD;  Location: [address];  Service: Orthopedics;  Laterality: Left;   JOINT REPLACEMENT  1999   L TOTAL KNEE   LAPAROSCOPIC GASTRIC SLEEVE RESECTION N/A 01/04/2014   Procedure: LAPAROSCOPIC GASTRIC SLEEVE RESECTION;  Surgeon: Greer Pickerel, MD;  Location: [facility];  Service: General;  Laterality: N/A;   TONSILLECTOMY     TOTAL KNEE ARTHROPLASTY Right 05/17/2015   Procedure: RIGHT TOTAL KNEE ARTHROPLASTY;  Surgeon: Paralee Cancel, MD;  Location: [facility];  Service: Orthopedics;  Laterality: Right;   TRANSTHORACIC ECHOCARDIOGRAM  11/2012   EF 60-65%, mod LVH & mod conc hypertrophy, grade 1 diastolic dysfunction; LA mildly dilated; RV systolic pressure increased; PA peak pressure 68mmHg   TUBAL LIGATION     UPPER GI ENDOSCOPY  01/04/2014   Procedure: UPPER GI ENDOSCOPY;  Surgeon: Greer Pickerel, MD;  Location: [facility];  Service: General;;    SOCIAL HISTORY: Social History  Substance Use Topics   Smoking status: Never Smoker   Smokeless tobacco: Never Used   Alcohol use No    FAMILY HISTORY: Family History  Problem Relation Age of Onset   Diabetes Father    Hyperlipidemia Father    Hypertension Father    Heart disease Father    Sudden death Father    Anxiety disorder Father    Obesity Father    Uterine cancer Mother    Cervical cancer Mother    Breast cancer Mother    Cancer Mother        cervical and brreast cancer   Hypertension Mother    Hyperlipidemia Mother    Obesity Mother    Diabetes Maternal Grandmother     ROS: Review of Systems  Constitutional: Positive for malaise/fatigue and weight loss.  Respiratory: Negative for shortness of breath (on exertion).   Cardiovascular: Negative for chest pain.  Gastrointestinal: Negative for nausea and vomiting.    Musculoskeletal:       Negative muscle weakness  Neurological: Negative for headaches.  Endo/Heme/Allergies:       Negative hypoglycemia Negative polyphagia    PHYSICAL EXAM: Blood pressure (!) 143/89, pulse 71, temperature 98.8 F (37.1 C), temperature source Oral, height 5\' 1"  (1.549 m), weight 265 lb (120.2 kg), SpO2 97 %. Body mass index is 50.07 kg/m. Physical Exam  Constitutional: She is oriented to person, place, and time. She appears well-developed and well-nourished.  Cardiovascular: Normal rate.   Pulmonary/Chest: Effort normal.  Musculoskeletal: Normal range of motion.  Neurological: She is oriented to person, place, and time.  Skin: Skin is warm and dry.  Psychiatric: She has a normal mood and affect. Her behavior is normal.  Vitals reviewed.   RECENT LABS AND TESTS: BMET    Component Value Date/Time   NA 144 01/01/2017 1335   K 4.2 01/01/2017 1335   CL 101 01/01/2017 1335   CO2 26 01/01/2017 1335   GLUCOSE 82 01/01/2017 1335   GLUCOSE 141 (H) 05/18/2015 0456   BUN 15 01/01/2017 1335   CREATININE 0.75 01/01/2017 1335   CALCIUM 9.7 01/01/2017 1335   GFRNONAA 86 01/01/2017 1335   GFRAA 99 01/01/2017 1335   Lab Results  Component Value Date   HGBA1C 5.9 (H) 01/01/2017   HGBA1C 6.1 (H) 07/02/2013   HGBA1C (H) 09/24/2006    6.9 (NOTE)   The ADA recommends the following therapeutic goals for glycemic   control related to Hgb A1C measurement:   Goal of Therapy:   < 7.0% Hgb A1C   Action Suggested:  > 8.0% Hgb A1C   Ref:  Diabetes Care, 22, Suppl. 1, 1999   Lab Results  Component Value Date   INSULIN 9.8 01/01/2017   CBC    Component Value Date/Time   WBC 7.6 01/01/2017 1335   WBC 8.0 05/18/2015 0456   RBC 4.91 01/01/2017  1335   RBC 3.29 (L) 05/18/2015 0456   HGB 13.6 01/01/2017 1335   HCT 42.6 01/01/2017 1335   PLT 117 (L) 05/18/2015 0456   MCV 87 01/01/2017 1335   MCH 27.7 01/01/2017 1335   MCH 29.2 05/18/2015 0456   MCHC 31.9 01/01/2017 1335    MCHC 32.3 05/18/2015 0456   RDW 14.7 01/01/2017 1335   LYMPHSABS 1.9 01/01/2017 1335   MONOABS 0.8 03/26/2015 0316   EOSABS 0.2 01/01/2017 1335   BASOSABS 0.1 01/01/2017 1335   Iron/TIBC/Ferritin/ %Sat No results found for: IRON, TIBC, FERRITIN, IRONPCTSAT Lipid Panel     Component Value Date/Time   CHOL 207 (H) 01/01/2017 1335   TRIG 139 01/01/2017 1335   HDL 73 01/01/2017 1335   CHOLHDL 4.2 07/02/2013 1301   VLDL 43 (H) 07/02/2013 1301   LDLCALC 106 (H) 01/01/2017 1335   Hepatic Function Panel     Component Value Date/Time   PROT 7.7 01/01/2017 1335   ALBUMIN 4.5 01/01/2017 1335   AST 25 01/01/2017 1335   ALT 22 01/01/2017 1335   ALKPHOS 84 01/01/2017 1335   BILITOT 0.4 01/01/2017 1335      Component Value Date/Time   TSH 2.350 01/01/2017 1335   TSH 1.523 07/20/2014 1546   TSH 1.100 01/04/2014 2100    ASSESSMENT AND PLAN: Vitamin D deficiency - Plan: Vitamin D, Ergocalciferol, (DRISDOL) 50000 units CAPS capsule  Prediabetes  Essential hypertension  S/P gastric surgery  Class 3 obesity with serious comorbidity and body mass index (BMI) of 50.0 to 59.9 in adult, unspecified obesity type ([place])  PLAN:  Vitamin D Deficiency Rebekah [name] was informed that low vitamin D levels contributes to fatigue and are associated with obesity, breast, and colon cancer. She agrees to start to take prescription Vit D @50 ,000 IU every week #4 with 1 refill and we will re-check labs in 3 months and will follow up for routine testing of vitamin D, at least 2-3 times per year. She was informed of the risk of over-replacement of vitamin D and agrees to not increase her dose unless he discusses this with [name] first.  Pre-Diabetes Rebekah [name] will continue to work on weight loss, exercise, and decreasing simple carbohydrates in her diet to help decrease the risk of diabetes. She was informed that eating too many simple carbohydrates or too many calories at one sitting increases the likelihood of GI side  effects. We will re-check labs in 3 months and Rebekah [name] agreed to follow up with [name] as directed to monitor her progress.  Hypertension We discussed sodium restriction, working on healthy weight loss, and a regular exercise program as the means to achieve improved blood pressure control. Rebekah [name] agreed with this plan and agreed to follow up as directed. We will re-check blood pressure in 1 week and will continue to monitor her blood pressure as well as her progress with the above lifestyle modifications. She will continue her medications as prescribed and will watch for signs of hypotension as she continues her lifestyle modifications.  Obesity Rebekah [name] is currently in the action stage of change. As such, her goal is to continue with weight loss efforts She has agreed to keep a food journal with 1100 to 1200 calories and 70+ grams of protein daily Hajira has been instructed to work up to a goal of 150 minutes of combined cardio and strengthening exercise per week for weight loss and overall health benefits. We discussed the following Behavioral Modification Strategies today: keep a strict food journal, increasing  lean protein intake and decreasing simple carbohydrates   Maha has agreed to follow up with our clinic as needed. She was informed of the importance of frequent follow up visits to maximize her success with intensive lifestyle modifications for her multiple health conditions.  I, Doreene Nest, am acting as transcriptionist for Dennard Nip, MD  I have reviewed the above documentation for accuracy and completeness, and I agree with the above. -Dennard Nip, MD    OBESITY BEHAVIORAL INTERVENTION VISIT  Today's visit was # 2 out of 96.  Starting weight: 267 lbs Starting date: 01/01/17 Today's weight : 265 lbs Today's date: 01/08/2017 Total lbs lost to date: 2 (Patients must lose 7 lbs in the first 6 months to continue with counseling)   ASK: We discussed the diagnosis of obesity with [name] today and Santina agreed to give [name] permission to discuss obesity behavioral modification therapy today.  ASSESS: Allyne has the diagnosis of obesity and her BMI today is 50.1 Arrion is in the action stage of change   ADVISE: Lynessa was educated on the multiple health risks of obesity as well as the benefit of weight loss to improve her health. She was advised of the need for long term treatment and the importance of lifestyle modifications.  AGREE: Multiple dietary modification options and treatment options were discussed and  Marabella agreed to keep a food journal with 1100 to 1200 calories and 70+ grams of protein daily We discussed the following Behavioral Modification Strategies today: keep a strict food journal, increasing lean protein intake and decreasing simple carbohydrates

## 2017-01-15 ENCOUNTER — Ambulatory Visit (INDEPENDENT_AMBULATORY_CARE_PROVIDER_SITE_OTHER): Payer: BC Managed Care – PPO | Admitting: Dietician

## 2017-01-15 ENCOUNTER — Telehealth (INDEPENDENT_AMBULATORY_CARE_PROVIDER_SITE_OTHER): Payer: Self-pay | Admitting: Family Medicine

## 2017-01-15 VITALS — Ht 61.0 in | Wt 258.0 lb

## 2017-01-15 DIAGNOSIS — Z9884 Bariatric surgery status: Secondary | ICD-10-CM | POA: Diagnosis not present

## 2017-01-15 DIAGNOSIS — Z9189 Other specified personal risk factors, not elsewhere classified: Secondary | ICD-10-CM

## 2017-01-15 DIAGNOSIS — R7303 Prediabetes: Secondary | ICD-10-CM | POA: Diagnosis not present

## 2017-01-15 DIAGNOSIS — Z6841 Body Mass Index (BMI) 40.0 and over, adult: Secondary | ICD-10-CM | POA: Diagnosis not present

## 2017-01-15 NOTE — Telephone Encounter (Signed)
Pt called in requesting to speak with [name] about her diet. She is concerned about not eating all the food and getting the calories in . She will be in for the group session today. Is asking for a call back if possible. But states she can always talk tonight.

## 2017-01-16 NOTE — Telephone Encounter (Signed)
[name] spoke with the patient at the group session. She tried calling the patient but she was on the way to the office. Rebekah [name], [place]

## 2017-01-21 NOTE — Progress Notes (Signed)
  Office: [phone]  /  Fax: [phone]  OBESITY AND PREVENTATIVE COUNSELING BEHAVIORAL INTERVENTION VISIT  Today's one-on-one visit was # 3 out of 24, and group BOT# 1  Starting weight: 267 lbs Starting date: 01/01/17 Today's weight : Weight: 258 lb (117 kg)  Today's date: 01/15/17 Total lbs lost to date: 9 lbs (Patients must lose 7 lbs in the first 6 months to continue with counseling)  PREVENTATIVE COUNSELING: Shiloh is at high risk of developing multiple serious health conditions including uncontrolled diabetes, coronary artery disease, heart failure, sleep apnea, chronic pain, depression, obesity related cancers and more due to her weight. These risks have been discussed in depth and Deloris has agreed to work on the underlying disease of obesity to decrease the risk of developing any and all of these obesity related disease.  Miguelina received intensive counseling today on specific behavioral modifications to get her back on track s/p her lap sleeve gastrectomy. She is following her eating plan approximately 100% of the time journaling 1100-1200 calories and 70+ grams of protein per day. She continues to struggle to meet her calorie goals however is meeting her protein goals each day.   ASK: We discussed the diagnosis of obesity with [name] today and Lazariah agreed to give [name] permission to discuss obesity behavioral modification therapy today.  ASSESS: Zarea has the diagnosis of obesity and her BMI today is 48.77 Meryl is in the action stage of change   ADVISE: Terrilee was educated on the multiple health risks of obesity as well as the benefit of weight loss to improve her health. She was advised of the need for long term treatment and the importance of lifestyle modifications.  AGREE: Multiple dietary modification options and treatment options were discussed and  Astha agreed to keep a food journal with 1100-1200 calories and 70+ grams protein  We discussed the following Behavioral  Modification Stratagies today: increasing vegetables and work on meal planning and easy cooking plans and healthy options to reach her calorie goals.   We spent > than 50% of the 15 minute visit on the counseling as documented in the note.

## 2017-01-22 ENCOUNTER — Ambulatory Visit (INDEPENDENT_AMBULATORY_CARE_PROVIDER_SITE_OTHER): Payer: BC Managed Care – PPO | Admitting: Dietician

## 2017-01-22 VITALS — Ht 61.0 in | Wt 258.0 lb

## 2017-01-22 DIAGNOSIS — Z9189 Other specified personal risk factors, not elsewhere classified: Secondary | ICD-10-CM

## 2017-01-22 DIAGNOSIS — Z9884 Bariatric surgery status: Secondary | ICD-10-CM

## 2017-01-22 DIAGNOSIS — Z6841 Body Mass Index (BMI) 40.0 and over, adult: Secondary | ICD-10-CM

## 2017-01-22 DIAGNOSIS — R7303 Prediabetes: Secondary | ICD-10-CM

## 2017-01-23 NOTE — Progress Notes (Signed)
  Office: [phone]  /  Fax: [phone]  OBESITY AND PREVENTATIVE COUNSELING BEHAVIORAL INTERVENTION VISIT  Today's visit was # 4 out of 33, BOT# 2  Starting weight: 267 lbs Starting date: 01/01/17 Today's weight : Weight: 258 lb (117 kg)  Today's date: 01/22/17 Total lbs lost to date: 9 lbs (Patients must lose 7 lbs in the first 6 months to continue with counseling)  PREVENTATIVE COUNSELING: Rebekah [name] is at high risk of developing multiple serious health conditions including uncontrolled diabetes, coronary artery disease, heart failure, sleep apnea, chronic pain, depression, obesity related cancers and more due to her weight. These risks have been discussed in depth and Rebekah [name] has agreed to work on the underlying disease of obesity to decrease the risk of developing any and all of these obesity related disease.  Rebekah [name] received intensive behavior modification counseling today on emotional eating. Detailed strategies for handling emotional eating was discussed and written informations was provided. Rebekah [name]  is s/p laparoscopic sleeve gastrectomy and is actively working on an obesity treatment plan. She is journaling her food intake 100% of the time and is got to her protein and calorie goals most days this week.    ASK: We discussed the diagnosis of obesity with Rebekah [name] today and Rebekah [name] agreed to give [name] permission to discuss obesity behavioral modification therapy today.  ASSESS: Rebekah [name] has the diagnosis of obesity and her BMI today is 48.77 Rebekah [name] is in the action stage of change   ADVISE: Rebekah [name] was educated on the multiple health risks of obesity as well as the benefit of weight loss to improve her health. She was advised of the need for long term treatment and the importance of lifestyle modifications.  AGREE: Multiple dietary modification options and treatment options were discussed and  Rebekah [name] agreed to keep a food journal with 1100-1200 calories and 80+ grams protein  We discussed the  following Behavioral Modification Stratagies today: increasing lean protein intake, decreasing simple carbohydrates , emotional eating strategies, ways to avoid boredom eating, ways to avoid night time snacking and avoiding temptations  We spent > than 50% of the 15 minute visit on the counseling as documented in the note.

## 2017-01-29 ENCOUNTER — Ambulatory Visit (INDEPENDENT_AMBULATORY_CARE_PROVIDER_SITE_OTHER): Payer: BC Managed Care – PPO | Admitting: Dietician

## 2017-01-29 VITALS — Ht 61.0 in | Wt 256.0 lb

## 2017-01-29 DIAGNOSIS — Z6841 Body Mass Index (BMI) 40.0 and over, adult: Secondary | ICD-10-CM | POA: Diagnosis not present

## 2017-01-29 DIAGNOSIS — Z9189 Other specified personal risk factors, not elsewhere classified: Secondary | ICD-10-CM

## 2017-01-29 DIAGNOSIS — Z9884 Bariatric surgery status: Secondary | ICD-10-CM

## 2017-01-29 DIAGNOSIS — R7303 Prediabetes: Secondary | ICD-10-CM
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
## 2017-02-01 ENCOUNTER — Other Ambulatory Visit: Payer: Self-pay | Admitting: Physician Assistant

## 2017-02-02 ENCOUNTER — Encounter (INDEPENDENT_AMBULATORY_CARE_PROVIDER_SITE_OTHER): Payer: Self-pay | Admitting: Family Medicine

## 2017-02-05 ENCOUNTER — Ambulatory Visit (INDEPENDENT_AMBULATORY_CARE_PROVIDER_SITE_OTHER): Payer: BC Managed Care – PPO | Admitting: Dietician

## 2017-02-05 VITALS — Ht 61.0 in | Wt 263.0 lb

## 2017-02-05 DIAGNOSIS — Z9884 Bariatric surgery status: Secondary | ICD-10-CM | POA: Diagnosis not present

## 2017-02-05 DIAGNOSIS — R7303 Prediabetes: Secondary | ICD-10-CM | POA: Diagnosis not present

## 2017-02-05 DIAGNOSIS — Z9189 Other specified personal risk factors, not elsewhere classified: Secondary | ICD-10-CM | POA: Diagnosis not present

## 2017-02-05 DIAGNOSIS — Z6841 Body Mass Index (BMI) 40.0 and over, adult: Secondary | ICD-10-CM

## 2017-02-11 NOTE — Progress Notes (Signed)
  Office: [phone]  /  Fax: [phone]  OBESITY AND PREVENTATIVE COUNSELING BEHAVIORAL INTERVENTION VISIT  Today's visit was # 6 out of 77, BOT# 4  Starting weight: 267 lbs Starting date: 01/01/17 Today's weight : Weight: 263 lb (119.3 kg)  Today's date: 02/05/17 Total lbs lost to date: 4 lbs (Patients must lose 7 lbs in the first 6 months to continue with counseling)  PREVENTATIVE COUNSELING: Rebekah [name] is at high risk of developing multiple serious health conditions including uncontrolled diabetes, coronary artery disease, heart failure, sleep apnea, chronic pain, depression, obesity related cancers and more due to her weight. These risks have been discussed in depth and Rebekah [name] has agreed to work on the underlying disease of obesity to decrease the risk of developing any and all of these obesity related disease.  Rebekah [name] received intensive nutrition counseling today on healthy eating during the holiday's, celebrations and vacations and dealing with getting off track. Detailed behavior modification strategies for staying motivated during the holiday's/celebrations and vacations was discussed. Rebekah [name] is journaling her food intake 100% of the time and is actively working her obesity treatment plan.  ASK: We discussed the diagnosis of obesity with Rebekah [name] today and Rebekah [name] agreed to give [name] permission to discuss obesity behavioral modification therapy today.  ASSESS: Rebekah [name] has the diagnosis of obesity and her BMI today is 49.72 Rebekah [name] is in the action stage of change   ADVISE: Rebekah [name] was educated on the multiple health risks of obesity as well as the benefit of weight loss to improve her health. She was advised of the need for long term treatment and the importance of lifestyle modifications.  AGREE: Multiple dietary modification options and treatment options were discussed and  Rebekah [name] agreed to keep a food journal with 1100-1200 calories and 80+ grams protein  We discussed the following Behavioral  Modification Stratagies today: increasing lean protein intake, holiday eating strategies  and avoiding temptations  We spent > than 50% of the 15 minute visit on the counseling as documented in the note.

## 2017-02-21 ENCOUNTER — Ambulatory Visit: Payer: BC Managed Care – PPO | Admitting: Internal Medicine

## 2017-02-27 ENCOUNTER — Ambulatory Visit (INDEPENDENT_AMBULATORY_CARE_PROVIDER_SITE_OTHER): Payer: BC Managed Care – PPO | Admitting: Physician Assistant

## 2017-02-27 VITALS — BP 164/94 | HR 68 | Temp 97.9°F | Ht 61.0 in | Wt 275.0 lb

## 2017-02-27 DIAGNOSIS — Z6841 Body Mass Index (BMI) 40.0 and over, adult: Secondary | ICD-10-CM | POA: Diagnosis not present

## 2017-02-27 DIAGNOSIS — E559 Vitamin D deficiency, unspecified: Secondary | ICD-10-CM

## 2017-02-27 DIAGNOSIS — I10 Essential (primary) hypertension: Secondary | ICD-10-CM | POA: Diagnosis not present

## 2017-02-27 MED ORDER — VITAMIN D (ERGOCALCIFEROL) 1.25 MG (50000 UNIT) PO CAPS: 50000.0000 [IU] | ORAL_CAPSULE | ORAL | 0 refills | Status: DC

## 2017-02-27 NOTE — Progress Notes (Signed)
Office: [phone]  /  Fax: [phone]   HPI:   Chief Complaint: OBESITY Rebekah [name] is here to discuss her progress with her obesity treatment plan. She is on the keep a food journal with 1100 to 1200 calories and 70+ grams of protein daily and is following her eating plan approximately 70 % of the time. She states she is walking for 60 minutes 3 times per week. Rebekah [name] had increased emotional eating and has traveled out of state, which made it hard to follow the plan. She would like more meal planning ideas. Her weight is 275 lb (124.7 kg) today and has had a weight gain of 10 pounds over a period of 7 weeks since her last visit. She has gained 8 lbs since starting treatment with [name].  Vitamin D deficiency Rebekah [name] has a diagnosis of vitamin D deficiency. She is currently taking vit D and denies nausea, vomiting or muscle weakness.  Hypertension Rebekah [name] is a 61 y.o. female with hypertension. Her blood pressure is elevated today at 164/94 and she states this is due to "white coat syndrome". Rebekah [name] states her blood pressure at home is stable. Rebekah [name] denies chest pain or shortness of breath on exertion. She is working weight loss to help control her blood pressure with the goal of decreasing her risk of heart attack and stroke. Rebekah [name] blood pressure is not currently controlled.  ALLERGIES: Allergies  Allergen Reactions  . Beef-Derived Products Anaphylaxis    After tick bite, cannot eat beef, pork or lamb  . Lambs Quarters Anaphylaxis    After tick bite cannot eat beef, pork or lamb  . Mucinex [Guaifenesin Er] Anaphylaxis  . Pork-Derived Products Anaphylaxis    After tick bite cannot eat beef, pork or lamb  . Darvon [Propoxyphene] Other (See Comments)    Hallucinations   . Ace Inhibitors Swelling and Cough    Pedal Edema  . Propoxyphene Hcl Other (See Comments)    REACTION: hallucinate- Darvon  . Hydromet [Hydrocodone-Homatropine] Itching and Other (See Comments)    Severe stomach  pain-face itches.   . Sulfonamide Derivatives Rash    MEDICATIONS: Current Outpatient Medications on File Prior to Visit  Medication Sig Dispense Refill  . acetaminophen (TYLENOL) 500 MG tablet Take 500-1,000 mg by mouth every 6 (six) hours as needed for moderate pain or headache.    Marland Kitchen CALCIUM PO Take 1,000 mg by mouth daily.    . cetirizine (ZYRTEC) 10 MG tablet Take 10 mg by mouth at bedtime.     Marland Kitchen diltiazem (CARDIZEM) 30 MG tablet Take 1-2 tablets by mouth every 6 hours as needed for fast heart rates 30 tablet 1  . diltiazem (CARTIA XT) 120 MG 24 hr capsule Take 1 capsule (120 mg total) by mouth daily. 90 capsule 3  . diphenhydrAMINE (BENADRYL) 50 MG tablet Take 50 mg by mouth at bedtime as needed for itching.    Marland Kitchen EPINEPHrine (EPIPEN) 0.3 mg/0.3 mL IJ SOAJ injection Inject 0.3 mLs (0.3 mg total) into the muscle once as needed (anaphylaxis). 1 Device 6  . escitalopram (LEXAPRO) 10 MG tablet TAKE 1 TABLET BY MOUTH ONCE DAILY 90 tablet 0  . flecainide (TAMBOCOR) 100 MG tablet Take 3 tablets by mouth at onset of fast heart rates as needed 30 tablet 1  . furosemide (LASIX) 20 MG tablet Take 1 tablet (20 mg total) by mouth daily as needed for edema. 30 tablet 3  . levothyroxine (SYNTHROID, LEVOTHROID) 50 MCG tablet TAKE 1 TABLET  BY MOUTH ONCE DAILY BEFORE BREAKFAST 90 tablet 2  . losartan (COZAAR) 50 MG tablet Take 1/2 tablet by mouth each morning and 1 tablet each evening 135 tablet 1  . Multiple Vitamins-Minerals (MULTIVITAMIN ADULT PO) Take 1 capsule by mouth 2 (two) times daily. Journey 3+3 Bariatric Multivitamin    . OXYGEN Inhale 3 L/min into the lungs at bedtime. Continuously    . potassium chloride SA (K-DUR,KLOR-CON) 20 MEQ tablet Take 1 tablet (20 mEq total) by mouth daily as needed (take when taking lasix). 30 tablet 3  . rivaroxaban (XARELTO) 20 MG TABS tablet Take 1 tablet (20 mg total) by mouth daily with supper. 90 tablet 3  . traMADol (ULTRAM) 50 MG tablet Take 1 tablet (50 mg  total) by mouth every 6 (six) hours as needed. 120 tablet 5   No current facility-administered medications on file prior to visit.     PAST MEDICAL HISTORY: Past Medical History:  Diagnosis Date  . Anxiety   . Arthritis   . Bilateral bunions   . Complication of anesthesia   . Difficulty sleeping    prior sleep study did not reveal sleep apnea per patient  . DJD (degenerative joint disease)   . Dyspnea   . Dysrhythmia    a-fib  . Environmental allergies   . Food allergy   . Gallbladder problem   . GERD (gastroesophageal reflux disease)   . Heart murmur   . Hypertension    no meds after weight loss  . Hypothyroidism   . Incontinence of urine    at nite   . Joint pain   . Left atrial dilatation   . Left foot pain   . Leg edema   . Obesity    s/p gastric sleeve 12/2013 (previously weighed close to 400 lbs)  . Osteoarthritis   . Palpitations   . Paroxysmal atrial fibrillation (HCC)   . Pneumonia    hx of   . PONV (postoperative nausea and vomiting)   . Status post bilateral knee replacements   . Supplemental oxygen dependent    uses 2l/Lares at night, states HR goes low and O2 drops  . Tuberculosis    had 6 month of INH due to exposure   . Vaginal vault prolapse     PAST SURGICAL HISTORY: Past Surgical History:  Procedure Laterality Date  . APPENDECTOMY    . CHOLECYSTECTOMY    . EYE SURGERY     03/2014 lens implant left lens   . JOINT REPLACEMENT  1999   L TOTAL KNEE  . TONSILLECTOMY    . TRANSTHORACIC ECHOCARDIOGRAM  11/2012   EF 60-65%, mod LVH & mod conc hypertrophy, grade 1 diastolic dysfunction; LA mildly dilated; RV systolic pressure increased; PA peak pressure 25mmHg  . TUBAL LIGATION      SOCIAL HISTORY: Social History   Tobacco Use  . Smoking status: Never Smoker  . Smokeless tobacco: Never Used  Substance Use Topics  . Alcohol use: No  . Drug use: No    FAMILY HISTORY: Family History  Problem Relation Age of Onset  . Diabetes Father   .  Hyperlipidemia Father   . Hypertension Father   . Heart disease Father   . Sudden death Father   . Anxiety disorder Father   . Obesity Father   . Uterine cancer Mother   . Cervical cancer Mother   . Breast cancer Mother   . Cancer Mother        cervical and  brreast cancer  . Hypertension Mother   . Hyperlipidemia Mother   . Obesity Mother   . Diabetes Maternal Grandmother     ROS: Review of Systems  Constitutional: Negative for weight loss.  Respiratory: Negative for shortness of breath (on exertion).   Cardiovascular: Negative for chest pain.  Gastrointestinal: Negative for nausea and vomiting.  Musculoskeletal:       Negative muscle weakness    PHYSICAL EXAM: Blood pressure (!) 164/94, pulse 68, temperature 97.9 F (36.6 C), temperature source Oral, height 5\' 1"  (1.549 m), weight 275 lb (124.7 kg), SpO2 99 %. Body mass index is 51.96 kg/m. Physical Exam  Constitutional: She is oriented to person, place, and time. She appears well-developed and well-nourished.  Cardiovascular: Normal rate.  Pulmonary/Chest: Effort normal.  Musculoskeletal: Normal range of motion.  Neurological: She is oriented to person, place, and time.  Skin: Skin is warm and dry.  Psychiatric: She has a normal mood and affect. Her behavior is normal.  Vitals reviewed.   RECENT LABS AND TESTS: BMET    Component Value Date/Time   NA 144 01/01/2017 1335   K 4.2 01/01/2017 1335   CL 101 01/01/2017 1335   CO2 26 01/01/2017 1335   GLUCOSE 82 01/01/2017 1335   GLUCOSE 141 (H) 05/18/2015 0456   BUN 15 01/01/2017 1335   CREATININE 0.75 01/01/2017 1335   CALCIUM 9.7 01/01/2017 1335   GFRNONAA 86 01/01/2017 1335   GFRAA 99 01/01/2017 1335   Lab Results  Component Value Date   HGBA1C 5.9 (H) 01/01/2017   HGBA1C 6.1 (H) 07/02/2013   HGBA1C (H) 09/24/2006    6.9 (NOTE)   The ADA recommends the following therapeutic goals for glycemic   control related to Hgb A1C measurement:   Goal of Therapy:    < 7.0% Hgb A1C   Action Suggested:  > 8.0% Hgb A1C   Ref:  Diabetes Care, 22, Suppl. 1, 1999   Lab Results  Component Value Date   INSULIN 9.8 01/01/2017   CBC    Component Value Date/Time   WBC 7.6 01/01/2017 1335   WBC 8.0 05/18/2015 0456   RBC 4.91 01/01/2017 1335   RBC 3.29 (L) 05/18/2015 0456   HGB 13.6 01/01/2017 1335   HCT 42.6 01/01/2017 1335   PLT 117 (L) 05/18/2015 0456   MCV 87 01/01/2017 1335   MCH 27.7 01/01/2017 1335   MCH 29.2 05/18/2015 0456   MCHC 31.9 01/01/2017 1335   MCHC 32.3 05/18/2015 0456   RDW 14.7 01/01/2017 1335   LYMPHSABS 1.9 01/01/2017 1335   MONOABS 0.8 03/26/2015 0316   EOSABS 0.2 01/01/2017 1335   BASOSABS 0.1 01/01/2017 1335   Iron/TIBC/Ferritin/ %Sat No results found for: IRON, TIBC, FERRITIN, IRONPCTSAT Lipid Panel     Component Value Date/Time   CHOL 207 (H) 01/01/2017 1335   TRIG 139 01/01/2017 1335   HDL 73 01/01/2017 1335   CHOLHDL 4.2 07/02/2013 1301   VLDL 43 (H) 07/02/2013 1301   LDLCALC 106 (H) 01/01/2017 1335   Hepatic Function Panel     Component Value Date/Time   PROT 7.7 01/01/2017 1335   ALBUMIN 4.5 01/01/2017 1335   AST 25 01/01/2017 1335   ALT 22 01/01/2017 1335   ALKPHOS 84 01/01/2017 1335   BILITOT 0.4 01/01/2017 1335      Component Value Date/Time   TSH 2.350 01/01/2017 1335   TSH 1.523 07/20/2014 1546   TSH 1.100 01/04/2014 2100    ASSESSMENT AND PLAN: Vitamin D  deficiency - Plan: Vitamin D, Ergocalciferol, (DRISDOL) 50000 units CAPS capsule  Essential hypertension  Class 3 severe obesity with serious comorbidity and body mass index (BMI) of 50.0 to 59.9 in adult, unspecified obesity type ([place])  PLAN:  Vitamin D Deficiency Rebekah [name] was informed that low vitamin D levels contributes to fatigue and are associated with obesity, breast, and colon cancer. She agrees to continue to take prescription Vit D @50 ,000 IU every week and will follow up for routine testing of vitamin D, at least 2-3 times per year.  She was informed of the risk of over-replacement of vitamin D and agrees to not increase her dose unless he discusses this with [name] first.  Hypertension We discussed sodium restriction, working on healthy weight loss, and a regular exercise program as the means to achieve improved blood pressure control. Rebekah [name] agreed with this plan and agreed to follow up as directed. We will continue to monitor her blood pressure as well as her progress with the above lifestyle modifications. She will continue her medications as prescribed and will watch for signs of hypotension as she continues her lifestyle modifications.  Obesity Rebekah [name] is currently in the action stage of change. As such, her goal is to continue with weight loss efforts She has agreed to keep a food journal with 1200 calories and 85 grams of protein daily Rebekah [name] has been instructed to work up to a goal of 150 minutes of combined cardio and strengthening exercise per week for weight loss and overall health benefits. We discussed the following Behavioral Modification Strategies today: increasing lean protein intake, holiday eating strategies  and emotional eating strategies  Rebekah [name] has agreed to follow up with our clinic in 2 weeks. She was informed of the importance of frequent follow up visits to maximize her success with intensive lifestyle modifications for her multiple health conditions.  I, Rebekah [name], am acting as transcriptionist for Rebekah Duverney, PA-C  I have reviewed the above documentation for accuracy and completeness, and I agree with the above. -Rebekah Duverney, PA-C  I have reviewed the above note and agree with the plan. -Rebekah Nip, MD   OBESITY BEHAVIORAL INTERVENTION VISIT  Today's visit was # 3 out of 22.  Starting weight: 267 lbs Starting date: 01/01/17 Today's weight : 275 lbs Today's date: 02/27/2017 Total lbs lost to date: 0 (Patients must lose 7 lbs in the first 6 months to continue with counseling)   ASK: We  discussed the diagnosis of obesity with Rebekah [name] today and Rebekah [name] agreed to give [name] permission to discuss obesity behavioral modification therapy today.  ASSESS: Rebekah [name] has the diagnosis of obesity and her BMI today is 51.99 Rebekah [name] is in the action stage of change   ADVISE: Rebekah [name] was educated on the multiple health risks of obesity as well as the benefit of weight loss to improve her health. She was advised of the need for long term treatment and the importance of lifestyle modifications.  AGREE: Multiple dietary modification options and treatment options were discussed and  Rebekah [name] agreed to keep a food journal with 1200 calories and 85 grams of protein daily We discussed the following Behavioral Modification Strategies today: increasing lean protein intake, holiday eating strategies  and emotional eating strategies

## 2017-03-04 ENCOUNTER — Ambulatory Visit: Payer: BC Managed Care – PPO | Admitting: Internal Medicine

## 2017-03-12 ENCOUNTER — Ambulatory Visit (INDEPENDENT_AMBULATORY_CARE_PROVIDER_SITE_OTHER): Payer: BC Managed Care – PPO | Admitting: Physician Assistant

## 2017-03-12 VITALS — BP 161/87 | HR 70 | Temp 98.7°F | Ht 61.0 in | Wt 263.0 lb

## 2017-03-12 DIAGNOSIS — I10 Essential (primary) hypertension: Secondary | ICD-10-CM

## 2017-03-12 DIAGNOSIS — Z6841 Body Mass Index (BMI) 40.0 and over, adult: Secondary | ICD-10-CM

## 2017-03-12 NOTE — Progress Notes (Signed)
Office: [phone]  /  Fax: [phone]   HPI:   Chief Complaint: OBESITY Rebekah [name] is here to discuss her progress with her obesity treatment plan. She is on the keep a food journal with 1200 calories and 85 grams of protein daily and is following her eating plan approximately 100 % of the time. She states she is exercising 0 minutes 0 times per week. Rebekah [name] was retaining fluid on previous visit but states she has been urinating more and feels swelling of her legs has improved. She would like more meal planning ideas.  Her weight is 263 lb (119.3 kg) today and has had a weight loss of 12 pounds over a period of 1 to 2 weeks since her last visit. She has lost 4 lbs since starting treatment with [name].  Hypertension Rebekah [name] is a 61 y.o. female with hypertension. Rebekah [name]'s blood pressure is elevated and she states related to "white coat syndrome". She declines any adjustments on her medications. She denies chest pain or shortness of breath. She is working weight loss to help control her blood pressure with the goal of decreasing her risk of heart attack and stroke. Rebekah [name] blood pressure is not currently controlled.  ALLERGIES: Allergies  Allergen Reactions  . Beef-Derived Products Anaphylaxis    After tick bite, cannot eat beef, pork or lamb  . Lambs Quarters Anaphylaxis    After tick bite cannot eat beef, pork or lamb  . Mucinex [Guaifenesin Er] Anaphylaxis  . Pork-Derived Products Anaphylaxis    After tick bite cannot eat beef, pork or lamb  . Darvon [Propoxyphene] Other (See Comments)    Hallucinations   . Ace Inhibitors Swelling and Cough    Pedal Edema  . Propoxyphene Hcl Other (See Comments)    REACTION: hallucinate- Darvon  . Hydromet [Hydrocodone-Homatropine] Itching and Other (See Comments)    Severe stomach pain-face itches.   . Sulfonamide Derivatives Rash    MEDICATIONS: Current Outpatient Medications on File Prior to Visit  Medication Sig Dispense Refill  .  acetaminophen (TYLENOL) 500 MG tablet Take 500-1,000 mg by mouth every 6 (six) hours as needed for moderate pain or headache.    Marland Kitchen CALCIUM PO Take 1,000 mg by mouth daily.    . cetirizine (ZYRTEC) 10 MG tablet Take 10 mg by mouth at bedtime.     Marland Kitchen diltiazem (CARDIZEM) 30 MG tablet Take 1-2 tablets by mouth every 6 hours as needed for fast heart rates 30 tablet 1  . diltiazem (CARTIA XT) 120 MG 24 hr capsule Take 1 capsule (120 mg total) by mouth daily. 90 capsule 3  . diphenhydrAMINE (BENADRYL) 50 MG tablet Take 50 mg by mouth at bedtime as needed for itching.    Marland Kitchen EPINEPHrine (EPIPEN) 0.3 mg/0.3 mL IJ SOAJ injection Inject 0.3 mLs (0.3 mg total) into the muscle once as needed (anaphylaxis). 1 Device 6  . escitalopram (LEXAPRO) 10 MG tablet TAKE 1 TABLET BY MOUTH ONCE DAILY 90 tablet 0  . flecainide (TAMBOCOR) 100 MG tablet Take 3 tablets by mouth at onset of fast heart rates as needed 30 tablet 1  . furosemide (LASIX) 20 MG tablet Take 1 tablet (20 mg total) by mouth daily as needed for edema. 30 tablet 3  . levothyroxine (SYNTHROID, LEVOTHROID) 50 MCG tablet TAKE 1 TABLET BY MOUTH ONCE DAILY BEFORE BREAKFAST 90 tablet 2  . losartan (COZAAR) 50 MG tablet Take 1/2 tablet by mouth each morning and 1 tablet each evening 135 tablet 1  .  Multiple Vitamins-Minerals (MULTIVITAMIN ADULT PO) Take 1 capsule by mouth 2 (two) times daily. Journey 3+3 Bariatric Multivitamin    . OXYGEN Inhale 3 L/min into the lungs at bedtime. Continuously    . potassium chloride SA (K-DUR,KLOR-CON) 20 MEQ tablet Take 1 tablet (20 mEq total) by mouth daily as needed (take when taking lasix). 30 tablet 3  . rivaroxaban (XARELTO) 20 MG TABS tablet Take 1 tablet (20 mg total) by mouth daily with supper. 90 tablet 3  . traMADol (ULTRAM) 50 MG tablet Take 1 tablet (50 mg total) by mouth every 6 (six) hours as needed. 120 tablet 5  . Vitamin D, Ergocalciferol, (DRISDOL) 50000 units CAPS capsule Take 1 capsule (50,000 Units total)  every 7 (seven) days by mouth. 4 capsule 0   No current facility-administered medications on file prior to visit.     PAST MEDICAL HISTORY: Past Medical History:  Diagnosis Date  . Anxiety   . Arthritis   . Bilateral bunions   . Complication of anesthesia   . Difficulty sleeping    prior sleep study did not reveal sleep apnea per patient  . DJD (degenerative joint disease)   . Dyspnea   . Dysrhythmia    a-fib  . Environmental allergies   . Food allergy   . Gallbladder problem   . GERD (gastroesophageal reflux disease)   . Heart murmur   . Hypertension    no meds after weight loss  . Hypothyroidism   . Incontinence of urine    at nite   . Joint pain   . Left atrial dilatation   . Left foot pain   . Leg edema   . Obesity    s/p gastric sleeve 12/2013 (previously weighed close to 400 lbs)  . Osteoarthritis   . Palpitations   . Paroxysmal atrial fibrillation (HCC)   . Pneumonia    hx of   . PONV (postoperative nausea and vomiting)   . Status post bilateral knee replacements   . Supplemental oxygen dependent    uses 2l/[place] at night, states HR goes low and O2 drops  . Tuberculosis    had 6 month of INH due to exposure   . Vaginal vault prolapse     PAST SURGICAL HISTORY: Past Surgical History:  Procedure Laterality Date  . APPENDECTOMY    . CHOLECYSTECTOMY    . EYE SURGERY     03/2014 lens implant left lens   . FOOT ARTHRODESIS Left 03/15/2016   Procedure: TALONAVICULAR AND SUBTALAR ARTHRODESIS;  Surgeon: Wylene Simmer, MD;  Location: [place];  Service: Orthopedics;  Laterality: Left;  Marland Kitchen GASTROC RECESSION EXTREMITY Left 03/15/2016   Procedure: LEFT GASTROC RECESSION;  Surgeon: Wylene Simmer, MD;  Location: [place];  Service: Orthopedics;  Laterality: Left;  . JOINT REPLACEMENT  1999   L TOTAL KNEE  . LAPAROSCOPIC GASTRIC SLEEVE RESECTION N/A 01/04/2014   Procedure: LAPAROSCOPIC GASTRIC SLEEVE RESECTION;  Surgeon: Greer Pickerel, MD;   Location: [facility];  Service: General;  Laterality: N/A;  . TONSILLECTOMY    . TOTAL KNEE ARTHROPLASTY Right 05/17/2015   Procedure: RIGHT TOTAL KNEE ARTHROPLASTY;  Surgeon: Paralee Cancel, MD;  Location: [facility];  Service: Orthopedics;  Laterality: Right;  . TRANSTHORACIC ECHOCARDIOGRAM  11/2012   EF 60-65%, mod LVH & mod conc hypertrophy, grade 1 diastolic dysfunction; LA mildly dilated; RV systolic pressure increased; PA peak pressure 67mmHg  . TUBAL LIGATION    . UPPER GI ENDOSCOPY  01/04/2014  Procedure: UPPER GI ENDOSCOPY;  Surgeon: Greer Pickerel, MD;  Location: [facility];  Service: General;;    SOCIAL HISTORY: Social History   Tobacco Use  . Smoking status: Never Smoker  . Smokeless tobacco: Never Used  Substance Use Topics  . Alcohol use: No  . Drug use: No    FAMILY HISTORY: Family History  Problem Relation Age of Onset  . Diabetes Father   . Hyperlipidemia Father   . Hypertension Father   . Heart disease Father   . Sudden death Father   . Anxiety disorder Father   . Obesity Father   . Uterine cancer Mother   . Cervical cancer Mother   . Breast cancer Mother   . Cancer Mother        cervical and brreast cancer  . Hypertension Mother   . Hyperlipidemia Mother   . Obesity Mother   . Diabetes Maternal Grandmother     ROS: Review of Systems  Constitutional: Positive for weight loss.  Respiratory: Negative for shortness of breath.   Cardiovascular: Negative for chest pain.    PHYSICAL EXAM: Blood pressure (!) 161/87, pulse 70, temperature 98.7 F (37.1 C), temperature source Oral, height 5\' 1"  (1.549 m), weight 263 lb (119.3 kg), SpO2 98 %. Body mass index is 49.69 kg/m. Physical Exam  Constitutional: She is oriented to person, place, and time. She appears well-developed and well-nourished.  Cardiovascular: Normal rate.  Pulmonary/Chest: Effort normal.  Musculoskeletal: Normal range of motion.  Neurological: She is oriented to person, place, and time.  Skin: Skin  is warm and dry.  Psychiatric: She has a normal mood and affect. Her behavior is normal.  Vitals reviewed.   RECENT LABS AND TESTS: BMET    Component Value Date/Time   NA 144 01/01/2017 1335   K 4.2 01/01/2017 1335   CL 101 01/01/2017 1335   CO2 26 01/01/2017 1335   GLUCOSE 82 01/01/2017 1335   GLUCOSE 141 (H) 05/18/2015 0456   BUN 15 01/01/2017 1335   CREATININE 0.75 01/01/2017 1335   CALCIUM 9.7 01/01/2017 1335   GFRNONAA 86 01/01/2017 1335   GFRAA 99 01/01/2017 1335   Lab Results  Component Value Date   HGBA1C 5.9 (H) 01/01/2017   HGBA1C 6.1 (H) 07/02/2013   HGBA1C (H) 09/24/2006    6.9 (NOTE)   The ADA recommends the following therapeutic goals for glycemic   control related to Hgb A1C measurement:   Goal of Therapy:   < 7.0% Hgb A1C   Action Suggested:  > 8.0% Hgb A1C   Ref:  Diabetes Care, 22, Suppl. 1, 1999   Lab Results  Component Value Date   INSULIN 9.8 01/01/2017   CBC    Component Value Date/Time   WBC 7.6 01/01/2017 1335   WBC 8.0 05/18/2015 0456   RBC 4.91 01/01/2017 1335   RBC 3.29 (L) 05/18/2015 0456   HGB 13.6 01/01/2017 1335   HCT 42.6 01/01/2017 1335   PLT 117 (L) 05/18/2015 0456   MCV 87 01/01/2017 1335   MCH 27.7 01/01/2017 1335   MCH 29.2 05/18/2015 0456   MCHC 31.9 01/01/2017 1335   MCHC 32.3 05/18/2015 0456   RDW 14.7 01/01/2017 1335   LYMPHSABS 1.9 01/01/2017 1335   MONOABS 0.8 03/26/2015 0316   EOSABS 0.2 01/01/2017 1335   BASOSABS 0.1 01/01/2017 1335   Iron/TIBC/Ferritin/ %Sat No results found for: IRON, TIBC, FERRITIN, IRONPCTSAT Lipid Panel     Component Value Date/Time   CHOL 207 (H) 01/01/2017  1335   TRIG 139 01/01/2017 1335   HDL 73 01/01/2017 1335   CHOLHDL 4.2 07/02/2013 1301   VLDL 43 (H) 07/02/2013 1301   LDLCALC 106 (H) 01/01/2017 1335   Hepatic Function Panel     Component Value Date/Time   PROT 7.7 01/01/2017 1335   ALBUMIN 4.5 01/01/2017 1335   AST 25 01/01/2017 1335   ALT 22 01/01/2017 1335   ALKPHOS 84  01/01/2017 1335   BILITOT 0.4 01/01/2017 1335      Component Value Date/Time   TSH 2.350 01/01/2017 1335   TSH 1.523 07/20/2014 1546   TSH 1.100 01/04/2014 2100    ASSESSMENT AND PLAN: Essential hypertension  Class 3 severe obesity with serious comorbidity and body mass index (BMI) of 45.0 to 49.9 in adult, unspecified obesity type ([place])  PLAN:  Hypertension We discussed sodium restriction, working on healthy weight loss, and a regular exercise program as the means to achieve improved blood pressure control. Talisa agreed with this plan and agreed to follow up as directed. We will continue to monitor her blood pressure as well as her progress with the above lifestyle modifications. She will continue her medications as prescribed and will watch for signs of hypotension as she continues her lifestyle modifications. Adisen agrees to follow up with our clinic in 2 weeks.  We spent > than 50% of the 15 minute visit on the counseling as documented in the note.  Obesity Rebekah [name] is currently in the action stage of change. As such, her goal is to continue with weight loss efforts She has agreed to keep a food journal with 1200 calories and 85 grams of protein daily Rebekah [name] has been instructed to work up to a goal of 150 minutes of combined cardio and strengthening exercise per week for weight loss and overall health benefits. We discussed the following Behavioral Modification Strategies today: increasing lean protein intake and planning for success   Rebekah [name] has agreed to follow up with our clinic in 2 weeks. She was informed of the importance of frequent follow up visits to maximize her success with intensive lifestyle modifications for her multiple health conditions.  Rebekah [name], Rebekah [name], am acting as transcriptionist for Lacy Duverney, PA-C  Rebekah [name] have reviewed the above documentation for accuracy and completeness, and Rebekah [name] agree with the above. -Lacy Duverney, PA-C  Rebekah [name] have reviewed the above note and agree with  the plan. -Dennard Nip, MD     Today's visit was # 4 out of 22.  Starting weight: 267 lbs Starting date: 01/01/17 Today's weight : 263 lbs  Today's date: 03/12/2017 Total lbs lost to date: 4 (Patients must lose 7 lbs in the first 6 months to continue with counseling)   ASK: We discussed the diagnosis of obesity with [name] today and [place] agreed to give [name] permission to discuss obesity behavioral modification therapy today.  ASSESS: Saleemah has the diagnosis of obesity and her BMI today is 49.72 Burna is in the action stage of change   ADVISE: Vaani was educated on the multiple health risks of obesity as well as the benefit of weight loss to improve her health. She was advised of the need for long term treatment and the importance of lifestyle modifications.  AGREE: Multiple dietary modification options and treatment options were discussed and  Solenne agreed to keep a food journal with 1200 calories and 85 grams of protein daily We discussed the following Behavioral Modification Strategies today: increasing lean protein intake and planning for success

## 2017-03-15 ENCOUNTER — Ambulatory Visit: Payer: BC Managed Care – PPO | Admitting: Internal Medicine

## 2017-03-15 ENCOUNTER — Encounter: Payer: Self-pay | Admitting: Internal Medicine

## 2017-03-15 VITALS — BP 142/86 | HR 66 | Ht 61.0 in | Wt 266.0 lb

## 2017-03-15 DIAGNOSIS — I10 Essential (primary) hypertension: Secondary | ICD-10-CM | POA: Diagnosis not present

## 2017-03-15 DIAGNOSIS — I480 Paroxysmal atrial fibrillation: Secondary | ICD-10-CM

## 2017-03-15 NOTE — Progress Notes (Signed)
OFFICE NOTE  Chief Complaint:  No complaints, occasional a-fib  Primary Care Physician: Terald Sleeper, PA-C  HPI:  Rebekah [name] is a pleasant 61 year old female with a past medical history significant for her super morbid obesity, chronic hypoxia, hypertension, GERD and prior knee replacement. She was previously followed by Dr. Rollene Fare and was being evaluated for bariatric surgery. She is here to establish new care with me today and for preoperative evaluation. 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 She appears he seen Dr. Rollene Fare will order an echocardiogram on August of 2014 which showed moderate thickening of the left ventricle with EF of 60-65%, mildly increased RV systolic pressure and no significant valvular disease. There was left atrial enlargement. She denies any chest pain but does have expected shortness of breath with exertion. Lower extremity venous Dopplers were performed which show no evidence of significant venous insufficiency.    Rebekah [name] returns today for follow-up. She seems to be doing remarkably well. She underwent gastric sleeve surgery and has had close to 60 pound weight loss. Unfortunately she was complicated by postoperative atrial fibrillation. She was seen by Dr. Martinique and placed on Cardizem. Subsequently she converted to sinus rhythm within 24 hours spontaneously. She's had no recurrence since that time and has not been additionally anticoagulated. She was not continued on Cardizem. She does have an elevated CHADSVASC score of 2.  I saw Rebekah [name] back in the office today. Please see her recent notes when I saw her in the emergency room at [facility]. She was noted to be in atrial fibrillation again and given flecainide to help convert her, but this was not successful. She is placed on diltiazem and in addition  to metoprolol. Rate slowed down and eventually she cardioverted on her own. We also started her on Xarelto which she's continues to take. Overall she's had very good rate control his heart rate is remained in the mid 40s to mid 60s. Blood pressure is somewhat labile and ranges between the low 90s up to the 140s. Overall she feels fairly well. She still has a few episodes of palpitations during the week when she feels that she is in A. fib although she feels like she is in A. fib today and her rhythm is sinus. She may not be sensitive to A. fib anymore. 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  I saw Rebekah [name] back in the office today. She's had remarkable weight loss. In fact her weight after sleeve gastrectomy is down 288 pounds. Her main issue now is excess tissue for which she's tried to get plastic surgery but has been denied multiple times. She reports no further palpitations or atrial fibrillation. She continues to take Xarelto without any bleeding complications. She continues to use nocturnal oxygen due to hypoxia, although her sleep study indicated no sleep apnea, her overnight oximetry study did indicate hypoxia with a low O2 sats ration of 86%. This was, of course prior to the recent 80-100 pound weight loss. She also reports recently she's been having epistaxis. In fact she had an episode that lasted for 10-12 hours, she used a box of 40 micro-tampons to try to get it to stop and then eventually tried to go to the emergency room. She is using nasal saline but feels that the oxygen at night is  probably drying her nose out.  Rebekah [name] was seen today for an urgent add-on. This morning she was awakened by her alarm clock around 5 AM and noticed that her heart was racing when she woke up. It did not slow down right away and she felt increasingly more fatigue. She said after taking a shower she felt dizzy and had some chest pressure. Her heart rate was very elevated and she tried to  take it noting it was "greater than 200". She decided then take 300 mg of flecainide. Subsequently she took 2 diltiazem's and after several hours he decided to come in for her appointment with the orthopedist. She says that on the way in to the office her heart rate slowed down and she felt better. When she saw her orthopedist they felt that she should be reevaluated by [name] here. Currently she is asymptomatic. EKG was performed showing sinus rhythm with sinus arrhythmia at 81.  Rebekah [name] returns today for follow-up. She had been reporting some worsening leg swelling however this seems to be improved somewhat today. She has had no recurrent paroxysmal arrhythmias. She continues to want to use the pocket pill strategy.  11/21/2016  Rebekah [name] returns a for follow-up. Is been over year since I last saw her. She was last seen in the A. fib clinic for follow-up and is reported very infrequent use of flecainide. She says that she's used it a few times over the past year and has been successful in converting her to sinus rhythm. Of most notable is that she's had significant weight gain. Weight in March 2017 was 188 pounds and now up to 271 pounds. She was 204 pounds in January. She says this is typical to many factors including immobility after foot surgery, social stressors, brother who had a stroke and poor eating habits. She has a significant understanding of nutrition and her dietary needs but has had difficulty balancing that. She reports that she is consuming only 600-700 cal a day and has lost 20 pounds but has recently plateaued.  03/15/2017  Rebekah [name] returns today for follow-up.  I last saw her about 3 months ago.  We were working on her blood pressure which has been elevated.  Mostly it appears that her blood pressure is elevated in the morning.  She does not have sleep apnea however has had infrequent nocturnal oxygen desaturations and uses oxygen at night.  This may be contributing to her elevated  blood pressure in the morning.  Recently she has been taking extra losartan 50 mg at night but then takes 25 mg in the morning.  I reviewed her blood home blood pressures and they appear to be well controlled between 161-096 systolic and the 04V and 40J diastolic.  She reports infrequent atrial fibrillation.  The episodes however now are longer lasting and may last throughout the day, despite taking her flecainide.  They eventually go away however it is not clear if it works as quickly.  We considered and discussed monitoring and I presented to her an option of using the alive core application.  PMHx:  Past Medical History:  Diagnosis Date  . Anxiety   . Arthritis   . Bilateral bunions   . Complication of anesthesia   . Difficulty sleeping    prior sleep study did not reveal sleep apnea per patient  . DJD (degenerative joint disease)   . Dyspnea   . Dysrhythmia    a-fib  . Environmental allergies   . Food allergy   .  Gallbladder problem   . GERD (gastroesophageal reflux disease)   . Heart murmur   . Hypertension    no meds after weight loss  . Hypothyroidism   . Incontinence of urine    at nite   . Joint pain   . Left atrial dilatation   . Left foot pain   . Leg edema   . Obesity    s/p gastric sleeve 12/2013 (previously weighed close to 400 lbs)  . Osteoarthritis   . Palpitations   . Paroxysmal atrial fibrillation (HCC)   . Pneumonia    hx of   . PONV (postoperative nausea and vomiting)   . Status post bilateral knee replacements   . Supplemental oxygen dependent    uses 2l/[place] at night, states HR goes low and O2 drops  . Tuberculosis    had 6 month of INH due to exposure   . Vaginal vault prolapse     Past Surgical History:  Procedure Laterality Date  . APPENDECTOMY    . CHOLECYSTECTOMY    . EYE SURGERY     03/2014 lens implant left lens   . FOOT ARTHRODESIS Left 03/15/2016   Procedure: TALONAVICULAR AND SUBTALAR ARTHRODESIS;  Surgeon: Wylene Simmer, MD;  Location:  [facility];  Service: Orthopedics;  Laterality: Left;  Marland Kitchen GASTROC RECESSION EXTREMITY Left 03/15/2016   Procedure: LEFT GASTROC RECESSION;  Surgeon: Wylene Simmer, MD;  Location: [facility];  Service: Orthopedics;  Laterality: Left;  . JOINT REPLACEMENT  1999   L TOTAL KNEE  . LAPAROSCOPIC GASTRIC SLEEVE RESECTION N/A 01/04/2014   Procedure: LAPAROSCOPIC GASTRIC SLEEVE RESECTION;  Surgeon: Greer Pickerel, MD;  Location: [facility];  Service: General;  Laterality: N/A;  . TONSILLECTOMY    . TOTAL KNEE ARTHROPLASTY Right 05/17/2015   Procedure: RIGHT TOTAL KNEE ARTHROPLASTY;  Surgeon: Paralee Cancel, MD;  Location: [facility];  Service: Orthopedics;  Laterality: Right;  . TRANSTHORACIC ECHOCARDIOGRAM  11/2012   EF 60-65%, mod LVH & mod conc hypertrophy, grade 1 diastolic dysfunction; LA mildly dilated; RV systolic pressure increased; PA peak pressure 70mmHg  . TUBAL LIGATION    . UPPER GI ENDOSCOPY  01/04/2014   Procedure: UPPER GI ENDOSCOPY;  Surgeon: Greer Pickerel, MD;  Location: [facility];  Service: General;;    FAMHx:  Family History  Problem Relation Age of Onset  . Diabetes Father   . Hyperlipidemia Father   . Hypertension Father   . Heart disease Father   . Sudden death Father   . Anxiety disorder Father   . Obesity Father   . Uterine cancer Mother   . Cervical cancer Mother   . Breast cancer Mother   . Cancer Mother        cervical and brreast cancer  . Hypertension Mother   . Hyperlipidemia Mother   . Obesity Mother   . Diabetes Maternal Grandmother     SOCHx:   reports that  has never smoked. she has never used smokeless tobacco. She reports that she does not drink alcohol or use drugs.  ALLERGIES:  Allergies  Allergen Reactions  . Beef-Derived Products Anaphylaxis    After tick bite, cannot eat beef, pork or lamb  . Lambs Quarters Anaphylaxis    After tick bite cannot eat beef, pork or lamb  . Mucinex [Guaifenesin Er] Anaphylaxis  . Pork-Derived Products  Anaphylaxis    After tick bite cannot eat beef, pork or lamb  . Darvon [Propoxyphene] Other (See Comments)  Hallucinations   . Ace Inhibitors Swelling and Cough    Pedal Edema  . Hydromet [Hydrocodone-Homatropine] Itching and Other (See Comments)    Severe stomach pain-face itches.   . Sulfonamide Derivatives Rash    ROS: Pertinent items noted in HPI and remainder of comprehensive ROS otherwise negative.  HOME MEDS: Current Outpatient Medications  Medication Sig Dispense Refill  . acetaminophen (TYLENOL) 500 MG tablet Take 500-1,000 mg by mouth every 6 (six) hours as needed for moderate pain or headache.    Marland Kitchen amoxicillin (AMOXIL) 500 MG tablet Take 500 mg by mouth 3 (three) times daily. For 7 days    . CALCIUM PO Take 500 mg by mouth daily.     . cetirizine (ZYRTEC) 10 MG tablet Take 10 mg by mouth at bedtime.     Marland Kitchen diltiazem (CARDIZEM) 30 MG tablet Take 1-2 tablets by mouth every 6 hours as needed for fast heart rates 30 tablet 1  . diltiazem (CARTIA XT) 120 MG 24 hr capsule Take 1 capsule (120 mg total) by mouth daily. 90 capsule 3  . diphenhydrAMINE (BENADRYL) 50 MG tablet Take 50 mg by mouth at bedtime as needed for itching.    Marland Kitchen EPINEPHrine (EPIPEN) 0.3 mg/0.3 mL IJ SOAJ injection Inject 0.3 mLs (0.3 mg total) into the muscle once as needed (anaphylaxis). 1 Device 6  . escitalopram (LEXAPRO) 10 MG tablet TAKE 1 TABLET BY MOUTH ONCE DAILY 90 tablet 0  . flecainide (TAMBOCOR) 100 MG tablet Take 3 tablets by mouth at onset of fast heart rates as needed 30 tablet 1  . furosemide (LASIX) 20 MG tablet Take 1 tablet (20 mg total) by mouth daily as needed for edema. 30 tablet 3  . levothyroxine (SYNTHROID, LEVOTHROID) 50 MCG tablet TAKE 1 TABLET BY MOUTH ONCE DAILY BEFORE BREAKFAST 90 tablet 2  . losartan (COZAAR) 50 MG tablet Take 1/2 tablet by mouth each morning and 1 tablet each evening 135 tablet 1  . Multiple Vitamins-Minerals (MULTIVITAMIN ADULT PO) Take 1 capsule by mouth 2 (two)  times daily. Journey 3+3 Bariatric Multivitamin    . OXYGEN Inhale 3 L/min into the lungs at bedtime. Continuously    . potassium chloride SA (K-DUR,KLOR-CON) 20 MEQ tablet Take 1 tablet (20 mEq total) by mouth daily as needed (take when taking lasix). 30 tablet 3  . rivaroxaban (XARELTO) 20 MG TABS tablet Take 1 tablet (20 mg total) by mouth daily with supper. 90 tablet 3  . traMADol (ULTRAM) 50 MG tablet Take 1 tablet (50 mg total) by mouth every 6 (six) hours as needed. 120 tablet 5  . Vitamin D, Ergocalciferol, (DRISDOL) 50000 units CAPS capsule Take 1 capsule (50,000 Units total) every 7 (seven) days by mouth. 4 capsule 0   No current facility-administered medications for this visit.     LABS/IMAGING: No results found for this or any previous visit (from the past 48 hour(s)). No results found.  VITALS: BP (!) 142/86   Pulse 66   Ht 5\' 1"  (1.549 m)   Wt 266 lb (120.7 kg)   SpO2 98%   BMI 50.26 kg/m   EXAM: General appearance: alert, no distress and morbidly obese Neck: no carotid bruit, no JVD and thyroid not enlarged, symmetric, no tenderness/mass/nodules Lungs: clear to auscultation bilaterally Heart: regular rate and rhythm, S1, S2 normal, no murmur, click, rub or gallop Abdomen: soft, non-tender; bowel sounds normal; no masses,  no organomegaly Extremities: No ankle edema, excess tissue and firmness around the upper calf  and knee areas Pulses: 2+ and symmetric Skin: Skin color, texture, turgor normal. No rashes or lesions Neurologic: Grossly normal Psych: Pleasant  EKG: Sinus rhythm-personally reviewed  ASSESSMENT: 1. Paroxysmal atrial fibrillation, recurrent- on pocket pill therapy 2. Bilateral leg edema - minimal 3. Morbid obesity with significant weight gain and prior bariatric surgery 4. Essential hypertension  PLAN: 1.    Rebekah [name] has infrequent episodes of atrial fibrillation, however says they are lasting longer.  She is using a pocket pill therapy, but  it is not clear that it is always effective at terminating her A. fib.  I wonder if these episodes are A. fib or perhaps PACs or PVCs.  We could consider monitoring or I offered her the ability to get the alive core application.  She seemed interested in this.  I weight is down somewhat.  She has been working in the weight loss clinic with Dr. Leafy Ro.  We will continue her current medications as blood pressure is generally well controlled.  Follow-up 6 months.  Pixie Casino, MD, [facility] Attending Cardiologist Maroa C Ariel Wingrove 03/15/2017, 9:14 AM

## 2017-03-15 NOTE — Patient Instructions (Signed)
Dr. Debara Pickett has recommended looking in to [place] by [facility] physician wants you to follow-up in: 6 months with Dr. Debara Pickett. You will receive a reminder letter in the mail two months in advance. If you don't receive a letter, please call our office to schedule the follow-up appointment.

## 2017-03-26 ENCOUNTER — Ambulatory Visit (INDEPENDENT_AMBULATORY_CARE_PROVIDER_SITE_OTHER): Payer: BC Managed Care – PPO | Admitting: Physician Assistant

## 2017-03-26 ENCOUNTER — Encounter (INDEPENDENT_AMBULATORY_CARE_PROVIDER_SITE_OTHER): Payer: Self-pay | Admitting: Physician Assistant

## 2017-04-11 ENCOUNTER — Telehealth: Payer: Self-pay | Admitting: Internal Medicine

## 2017-04-11 MED ORDER — LOSARTAN POTASSIUM 50 MG PO TABS: ORAL_TABLET | ORAL | 1 refills | Status: DC

## 2017-04-11 NOTE — Telephone Encounter (Signed)
New Message     Needs a new prescription sent pharmacy she is taking 1 1/2 tablets a day    *STAT* If patient is at the pharmacy, call can be transferred to refill team.   1. Which medications need to be refilled? (please list name of each medication and dose if known)  losartan (COZAAR) 50 MG tablet Take 1/2 tablet by mouth each morning and 1 tablet each evening     2. Which pharmacy/location (including street and city if local pharmacy) is medication to be sent to? [facility] in [place]  3. Do they need a 30 day or 90 day supply? [place]

## 2017-04-17 ENCOUNTER — Ambulatory Visit (INDEPENDENT_AMBULATORY_CARE_PROVIDER_SITE_OTHER): Payer: BC Managed Care – PPO | Admitting: Physician Assistant

## 2017-04-17 VITALS — BP 149/88 | HR 80 | Temp 98.5°F | Ht 61.0 in | Wt 262.0 lb

## 2017-04-17 DIAGNOSIS — E559 Vitamin D deficiency, unspecified: Secondary | ICD-10-CM | POA: Diagnosis not present

## 2017-04-17 DIAGNOSIS — I10 Essential (primary) hypertension: Secondary | ICD-10-CM

## 2017-04-17 DIAGNOSIS — Z6841 Body Mass Index (BMI) 40.0 and over, adult: Secondary | ICD-10-CM

## 2017-04-17 MED ORDER — VITAMIN D (ERGOCALCIFEROL) 1.25 MG (50000 UNIT) PO CAPS: 50000.0000 [IU] | ORAL_CAPSULE | ORAL | 0 refills | Status: DC

## 2017-04-17 NOTE — Progress Notes (Signed)
Office: [phone]  /  Fax: [phone]   HPI:   Chief Complaint: OBESITY Rebekah [name] is here to discuss her progress with her obesity treatment plan. She is on the keep a food journal with 1200 calories and 85 grams of protein daily and is following her eating plan approximately 100 % of the time. She states she is walking for 15 minutes 3 times per week. Rebekah [name] continues to do well with weight loss. She is mindful of her eating and controlls her portions. She would like more meal planning ideas.  Her weight is 262 lb (118.8 kg) today and has had a weight loss of 1 pound over a period of 5 weeks since her last visit. She has lost 5 lbs since starting treatment with [name].  Vitamin D deficiency Rebekah [name] has a diagnosis of vitamin D deficiency. She is currently taking prescription Vit D, level at 25 and not yet at goal. She denies nausea, vomiting or muscle weakness.  Hypertension Rebekah [name] is a 62 y.o. female with hypertension. Rebekah [name]'s blood pressure is elevated. She states BP at home is normal, but has elevated readings in the am. On record review, Cardiologist  states BP at home are stable. He related high readings in am due to dec in O2 sats. Pt is on O2 at night. Pt declines any adjustment to her BP medicine states BP at home is stable and increase in office is due to "white coat syndrome". She denies any chest pain or shortness of breath. She is working weight loss to help control her blood pressure with the goal of decreasing her risk of heart attack and stroke. Rebekah [name]'s blood pressure is not currently controlled.  ALLERGIES: Allergies  Allergen Reactions  . Beef-Derived Products Anaphylaxis    After tick bite, cannot eat beef, pork or lamb  . Lambs Quarters Anaphylaxis    After tick bite cannot eat beef, pork or lamb  . Mucinex [Guaifenesin Er] Anaphylaxis  . Pork-Derived Products Anaphylaxis    After tick bite cannot eat beef, pork or lamb  . Darvon [Propoxyphene] Other (See Comments)   Hallucinations   . Ace Inhibitors Swelling and Cough    Pedal Edema  . Hydromet [Hydrocodone-Homatropine] Itching and Other (See Comments)    Severe stomach pain-face itches.   . Sulfonamide Derivatives Rash    MEDICATIONS: Current Outpatient Medications on File Prior to Visit  Medication Sig Dispense Refill  . acetaminophen (TYLENOL) 500 MG tablet Take 500-1,000 mg by mouth every 6 (six) hours as needed for moderate pain or headache.    Marland Kitchen CALCIUM PO Take 500 mg by mouth daily.     . cetirizine (ZYRTEC) 10 MG tablet Take 10 mg by mouth at bedtime.     Marland Kitchen diltiazem (CARDIZEM) 30 MG tablet Take 1-2 tablets by mouth every 6 hours as needed for fast heart rates 30 tablet 1  . diltiazem (CARTIA XT) 120 MG 24 hr capsule Take 1 capsule (120 mg total) by mouth daily. 90 capsule 3  . diphenhydrAMINE (BENADRYL) 50 MG tablet Take 50 mg by mouth at bedtime as needed for itching.    Marland Kitchen EPINEPHrine (EPIPEN) 0.3 mg/0.3 mL IJ SOAJ injection Inject 0.3 mLs (0.3 mg total) into the muscle once as needed (anaphylaxis). 1 Device 6  . escitalopram (LEXAPRO) 10 MG tablet TAKE 1 TABLET BY MOUTH ONCE DAILY 90 tablet 0  . flecainide (TAMBOCOR) 100 MG tablet Take 3 tablets by mouth at onset of fast heart rates as needed 30 tablet  1  . furosemide (LASIX) 20 MG tablet Take 1 tablet (20 mg total) by mouth daily as needed for edema. 30 tablet 3  . levothyroxine (SYNTHROID, LEVOTHROID) 50 MCG tablet TAKE 1 TABLET BY MOUTH ONCE DAILY BEFORE BREAKFAST 90 tablet 2  . losartan (COZAAR) 50 MG tablet Take 1/2 tablet by mouth each morning and 1 tablet each evening 135 tablet 1  . Multiple Vitamins-Minerals (MULTIVITAMIN ADULT PO) Take 1 capsule by mouth 2 (two) times daily. Journey 3+3 Bariatric Multivitamin    . OXYGEN Inhale 3 L/min into the lungs at bedtime. Continuously    . potassium chloride SA (K-DUR,KLOR-CON) 20 MEQ tablet Take 1 tablet (20 mEq total) by mouth daily as needed (take when taking lasix). 30 tablet 3  .  rivaroxaban (XARELTO) 20 MG TABS tablet Take 1 tablet (20 mg total) by mouth daily with supper. 90 tablet 3  . traMADol (ULTRAM) 50 MG tablet Take 1 tablet (50 mg total) by mouth every 6 (six) hours as needed. 120 tablet 5  . Vitamin D, Ergocalciferol, (DRISDOL) 50000 units CAPS capsule Take 1 capsule (50,000 Units total) every 7 (seven) days by mouth. 4 capsule 0   No current facility-administered medications on file prior to visit.     PAST MEDICAL HISTORY: Past Medical History:  Diagnosis Date  . Anxiety   . Arthritis   . Bilateral bunions   . Complication of anesthesia   . Difficulty sleeping    prior sleep study did not reveal sleep apnea per patient  . DJD (degenerative joint disease)   . Dyspnea   . Dysrhythmia    a-fib  . Environmental allergies   . Food allergy   . Gallbladder problem   . GERD (gastroesophageal reflux disease)   . Heart murmur   . Hypertension    no meds after weight loss  . Hypothyroidism   . Incontinence of urine    at nite   . Joint pain   . Left atrial dilatation   . Left foot pain   . Leg edema   . Obesity    s/p gastric sleeve 12/2013 (previously weighed close to 400 lbs)  . Osteoarthritis   . Palpitations   . Paroxysmal atrial fibrillation (HCC)   . Pneumonia    hx of   . PONV (postoperative nausea and vomiting)   . Status post bilateral knee replacements   . Supplemental oxygen dependent    uses 2l/ at night, states HR goes low and O2 drops  . Tuberculosis    had 6 month of INH due to exposure   . Vaginal vault prolapse     PAST SURGICAL HISTORY: Past Surgical History:  Procedure Laterality Date  . APPENDECTOMY    . CHOLECYSTECTOMY    . EYE SURGERY     03/2014 lens implant left lens   . FOOT ARTHRODESIS Left 03/15/2016   Procedure: TALONAVICULAR AND SUBTALAR ARTHRODESIS;  Surgeon: Wylene Simmer, MD;  Location: [facility];  Service: Orthopedics;  Laterality: Left;  Marland Kitchen GASTROC RECESSION EXTREMITY Left 03/15/2016     Procedure: LEFT GASTROC RECESSION;  Surgeon: Wylene Simmer, MD;  Location: [facility];  Service: Orthopedics;  Laterality: Left;  . JOINT REPLACEMENT  1999   L TOTAL KNEE  . LAPAROSCOPIC GASTRIC SLEEVE RESECTION N/A 01/04/2014   Procedure: LAPAROSCOPIC GASTRIC SLEEVE RESECTION;  Surgeon: Greer Pickerel, MD;  Location: [facility];  Service: General;  Laterality: N/A;  . TONSILLECTOMY    . TOTAL KNEE ARTHROPLASTY  Right 05/17/2015   Procedure: RIGHT TOTAL KNEE ARTHROPLASTY;  Surgeon: Paralee Cancel, MD;  Location: [facility];  Service: Orthopedics;  Laterality: Right;  . TRANSTHORACIC ECHOCARDIOGRAM  11/2012   EF 60-65%, mod LVH & mod conc hypertrophy, grade 1 diastolic dysfunction; LA mildly dilated; RV systolic pressure increased; PA peak pressure 76mmHg  . TUBAL LIGATION    . UPPER GI ENDOSCOPY  01/04/2014   Procedure: UPPER GI ENDOSCOPY;  Surgeon: Greer Pickerel, MD;  Location: [facility];  Service: General;;    SOCIAL HISTORY: Social History   Tobacco Use  . Smoking status: Never Smoker  . Smokeless tobacco: Never Used  Substance Use Topics  . Alcohol use: No  . Drug use: No    FAMILY HISTORY: Family History  Problem Relation Age of Onset  . Diabetes Father   . Hyperlipidemia Father   . Hypertension Father   . Heart disease Father   . Sudden death Father   . Anxiety disorder Father   . Obesity Father   . Uterine cancer Mother   . Cervical cancer Mother   . Breast cancer Mother   . Cancer Mother        cervical and brreast cancer  . Hypertension Mother   . Hyperlipidemia Mother   . Obesity Mother   . Diabetes Maternal Grandmother     ROS: Review of Systems  Constitutional: Positive for weight loss.  Respiratory: Negative for shortness of breath.   Cardiovascular: Negative for chest pain.  Gastrointestinal: Negative for nausea and vomiting.  Musculoskeletal:       Negative muscle weakness    PHYSICAL EXAM: Blood pressure (!) 149/88, pulse 80, temperature 98.5 F  (36.9 C), temperature source Oral, height 5\' 1"  (1.549 m), weight 262 lb (118.8 kg), SpO2 98 %. Body mass index is 49.5 kg/m. Physical Exam  Constitutional: She is oriented to person, place, and time. She appears well-developed and well-nourished.  Cardiovascular: Normal rate.  Pulmonary/Chest: Effort normal.  Musculoskeletal: Normal range of motion.  Neurological: She is oriented to person, place, and time.  Skin: Skin is warm and dry.  Psychiatric: She has a normal mood and affect. Her behavior is normal.  Vitals reviewed.   RECENT LABS AND TESTS: BMET    Component Value Date/Time   NA 144 01/01/2017 1335   K 4.2 01/01/2017 1335   CL 101 01/01/2017 1335   CO2 26 01/01/2017 1335   GLUCOSE 82 01/01/2017 1335   GLUCOSE 141 (H) 05/18/2015 0456   BUN 15 01/01/2017 1335   CREATININE 0.75 01/01/2017 1335   CALCIUM 9.7 01/01/2017 1335   GFRNONAA 86 01/01/2017 1335   GFRAA 99 01/01/2017 1335   Lab Results  Component Value Date   HGBA1C 5.9 (H) 01/01/2017   HGBA1C 6.1 (H) 07/02/2013   HGBA1C (H) 09/24/2006    6.9 (NOTE)   The ADA recommends the following therapeutic goals for glycemic   control related to Hgb A1C measurement:   Goal of Therapy:   < 7.0% Hgb A1C   Action Suggested:  > 8.0% Hgb A1C   Ref:  Diabetes Care, 22, Suppl. 1, 1999   Lab Results  Component Value Date   INSULIN 9.8 01/01/2017   CBC    Component Value Date/Time   WBC 7.6 01/01/2017 1335   WBC 8.0 05/18/2015 0456   RBC 4.91 01/01/2017 1335   RBC 3.29 (L) 05/18/2015 0456   HGB 13.6 01/01/2017 1335   HCT 42.6 01/01/2017 1335   PLT 117 (L) 05/18/2015  0456   MCV 87 01/01/2017 1335   MCH 27.7 01/01/2017 1335   MCH 29.2 05/18/2015 0456   MCHC 31.9 01/01/2017 1335   MCHC 32.3 05/18/2015 0456   RDW 14.7 01/01/2017 1335   LYMPHSABS 1.9 01/01/2017 1335   MONOABS 0.8 03/26/2015 0316   EOSABS 0.2 01/01/2017 1335   BASOSABS 0.1 01/01/2017 1335   Iron/TIBC/Ferritin/ %Sat No results found for: IRON, TIBC,  FERRITIN, IRONPCTSAT Lipid Panel     Component Value Date/Time   CHOL 207 (H) 01/01/2017 1335   TRIG 139 01/01/2017 1335   HDL 73 01/01/2017 1335   CHOLHDL 4.2 07/02/2013 1301   VLDL 43 (H) 07/02/2013 1301   LDLCALC 106 (H) 01/01/2017 1335   Hepatic Function Panel     Component Value Date/Time   PROT 7.7 01/01/2017 1335   ALBUMIN 4.5 01/01/2017 1335   AST 25 01/01/2017 1335   ALT 22 01/01/2017 1335   ALKPHOS 84 01/01/2017 1335   BILITOT 0.4 01/01/2017 1335      Component Value Date/Time   TSH 2.350 01/01/2017 1335   TSH 1.523 07/20/2014 1546   TSH 1.100 01/04/2014 2100  Results for KAIJAH, ABTS (MRN [id number]) as of 04/17/2017 09:31  Ref. Range 01/01/2017 13:35  Vitamin D, 25-Hydroxy Latest Ref Range: 30.0 - 100.0 ng/mL 25.6 (L)    ASSESSMENT AND PLAN: Vitamin D deficiency - Plan: Vitamin D, Ergocalciferol, (DRISDOL) 50000 units CAPS capsule  Essential hypertension  Class 3 severe obesity with serious comorbidity and body mass index (BMI) of 45.0 to 49.9 in adult, unspecified obesity type ([place])  PLAN:  Vitamin D Deficiency Rebekah [name] was informed that low vitamin D levels contributes to fatigue and are associated with obesity, breast, and colon cancer. Rebekah [name] agrees to continue taking prescription Vit D @50 ,000 IU every week #4 and we will refill for 1 month. She will follow up for routine testing of vitamin D, at least 2-3 times per year. She was informed of the risk of over-replacement of vitamin D and agrees to not increase her dose unless he discusses this with [name] first. Rebekah [name] agrees to follow up with our clinic in 2 weeks.  Hypertension We discussed sodium restriction, working on healthy weight loss, and a regular exercise program as the means to achieve improved blood pressure control. Rebekah [name] agreed with this plan and agreed to follow up as directed. We will continue to monitor her blood pressure as well as her progress with the above lifestyle modifications. She will continue  her medications as prescribed and will watch for signs of hypotension as she continues her lifestyle modifications.  Obesity Rebekah [name] is currently in the action stage of change. As such, her goal is to continue with weight loss efforts She has agreed to keep a food journal with 1200 calories and 85 grams of protein daily Rebekah [name] has been instructed to work up to a goal of 150 minutes of combined cardio and strengthening exercise per week for weight loss and overall health benefits. We discussed the following Behavioral Modification Strategies today: increasing lean protein intake and planning for success   Rebekah [name] has agreed to follow up with our clinic in 2 weeks. She was informed of the importance of frequent follow up visits to maximize her success with intensive lifestyle modifications for her multiple health conditions.  I, Trixie Dredge, am acting as transcriptionist for Lacy Duverney, PA-C  I have reviewed the above documentation for accuracy and completeness, and I agree with the above. -Lacy Duverney, PA-C  I  have reviewed the above note and agree with the plan. -Dennard Nip, MD     Today's visit was # 5 out of 22.  Starting weight: 267 lbs Starting date: 01/01/17 Today's weight : 262 lbs  Today's date: 04/17/2017 Total lbs lost to date: 5 (Patients must lose 7 lbs in the first 6 months to continue with counseling)   ASK: We discussed the diagnosis of obesity with Rebekah [name] today and Rebekah [name] agreed to give [name] permission to discuss obesity behavioral modification therapy today.  ASSESS: Rebekah [name] has the diagnosis of obesity and her BMI today is 49.53 Rebekah [name] is in the action stage of change   ADVISE: Rebekah [name] was educated on the multiple health risks of obesity as well as the benefit of weight loss to improve her health. She was advised of the need for long term treatment and the importance of lifestyle modifications.  AGREE: Multiple dietary modification options and treatment options were  discussed and  Rebekah [name] agreed to keep a food journal with 1200 calories and 85 grams of protein daily We discussed the following Behavioral Modification Strategies today: increasing lean protein intake and planning for success

## 2017-05-02 ENCOUNTER — Ambulatory Visit (INDEPENDENT_AMBULATORY_CARE_PROVIDER_SITE_OTHER): Payer: BC Managed Care – PPO | Admitting: Physician Assistant

## 2017-05-02 VITALS — BP 156/83 | HR 72 | Temp 98.1°F | Ht 61.0 in | Wt 260.0 lb

## 2017-05-02 DIAGNOSIS — Z6841 Body Mass Index (BMI) 40.0 and over, adult: Secondary | ICD-10-CM

## 2017-05-02 DIAGNOSIS — I10 Essential (primary) hypertension: Secondary | ICD-10-CM | POA: Diagnosis not present

## 2017-05-02 DIAGNOSIS — E038 Other specified hypothyroidism: Secondary | ICD-10-CM

## 2017-05-02 DIAGNOSIS — E559 Vitamin D deficiency, unspecified: Secondary | ICD-10-CM | POA: Insufficient documentation

## 2017-05-02 DIAGNOSIS — R7303 Prediabetes: Secondary | ICD-10-CM

## 2017-05-02 DIAGNOSIS — E7849 Other hyperlipidemia: Secondary | ICD-10-CM | POA: Diagnosis not present

## 2017-05-02 NOTE — Progress Notes (Addendum)
Office: [phone]  /  Fax: [phone]   HPI:   Chief Complaint: OBESITY Rebekah [name] is here to discuss her progress with her obesity treatment plan. She is on the keep a food journal with 1200 calories and 85 grams of protein daily and is following her eating plan approximately 50 % of the time. She states she is walking for 45-60 minutes 3 times per week. Rebekah [name] continues to do well with weight loss. She is very mindful of her eating but states that she over thinks about her eating. She would like advice on how to overcome her feeling that she will not succeed.  Her weight is 260 lb (117.9 kg) today and has had a weight loss of 2 pounds over a period of 2 weeks since her last visit. She has lost 7 lbs since starting treatment with [name].  Hypertension Rebekah [name] is a 62 y.o. female with hypertension. Rebekah [name]'s blood pressure is elevated in the office. She bought in her blood pressure log and blood pressure is stable at home. She denies chest pain or shortness of breath. She is working weight loss to help control her blood pressure with the goal of decreasing her risk of heart attack and stroke. Rebekah [name]'s blood pressure is not currently controlled.  Vitamin D deficiency Rebekah [name] has a diagnosis of vitamin D deficiency. She is currently taking prescription Vit D and denies nausea, vomiting or muscle weakness.  Pre-Diabetes Rebekah [name] has a diagnosis of pre-diabetes based on her elevated Hgb A1c and was informed this puts her at greater risk of developing diabetes. She is not taking metformin currently and declines today. Rebekah [name] continues to work on diet and exercise to decrease risk of diabetes. She denies polyphagia, nausea, or hypoglycemia.  Hyperlipidemia Rebekah [name] has hyperlipidemia and has been trying to improve her cholesterol levels with intensive lifestyle modification including a low saturated fat diet, exercise and weight loss. She is not on statin and declines today. She denies any chest pain, claudication or  myalgias.  Hypothyroidism Rebekah [name] has a diagnosis of hypothyroidism. She is on levothyroxine. She denies hot or cold intolerance, palpitations, or fatigue.  ALLERGIES: Allergies  Allergen Reactions  . Beef-Derived Products Anaphylaxis    After tick bite, cannot eat beef, pork or lamb  . Lambs Quarters Anaphylaxis    After tick bite cannot eat beef, pork or lamb  . Mucinex [Guaifenesin Er] Anaphylaxis  . Pork-Derived Products Anaphylaxis    After tick bite cannot eat beef, pork or lamb  . Darvon [Propoxyphene] Other (See Comments)    Hallucinations   . Ace Inhibitors Swelling and Cough    Pedal Edema  . Hydromet [Hydrocodone-Homatropine] Itching and Other (See Comments)    Severe stomach pain-face itches.   . Sulfonamide Derivatives Rash    MEDICATIONS: Current Outpatient Medications on File Prior to Visit  Medication Sig Dispense Refill  . acetaminophen (TYLENOL) 500 MG tablet Take 500-1,000 mg by mouth every 6 (six) hours as needed for moderate pain or headache.    Marland Kitchen CALCIUM PO Take 500 mg by mouth daily.     . cetirizine (ZYRTEC) 10 MG tablet Take 10 mg by mouth at bedtime.     Marland Kitchen diltiazem (CARDIZEM) 30 MG tablet Take 1-2 tablets by mouth every 6 hours as needed for fast heart rates 30 tablet 1  . diltiazem (CARTIA XT) 120 MG 24 hr capsule Take 1 capsule (120 mg total) by mouth daily. 90 capsule 3  . diphenhydrAMINE (BENADRYL) 50 MG tablet Take 50  mg by mouth at bedtime as needed for itching.    Marland Kitchen EPINEPHrine (EPIPEN) 0.3 mg/0.3 mL IJ SOAJ injection Inject 0.3 mLs (0.3 mg total) into the muscle once as needed (anaphylaxis). 1 Device 6  . escitalopram (LEXAPRO) 10 MG tablet TAKE 1 TABLET BY MOUTH ONCE DAILY 90 tablet 0  . flecainide (TAMBOCOR) 100 MG tablet Take 3 tablets by mouth at onset of fast heart rates as needed 30 tablet 1  . furosemide (LASIX) 20 MG tablet Take 1 tablet (20 mg total) by mouth daily as needed for edema. 30 tablet 3  . levothyroxine (SYNTHROID, LEVOTHROID)  50 MCG tablet TAKE 1 TABLET BY MOUTH ONCE DAILY BEFORE BREAKFAST 90 tablet 2  . losartan (COZAAR) 50 MG tablet Take 1/2 tablet by mouth each morning and 1 tablet each evening 135 tablet 1  . Multiple Vitamins-Minerals (MULTIVITAMIN ADULT PO) Take 1 capsule by mouth 2 (two) times daily. Journey 3+3 Bariatric Multivitamin    . OXYGEN Inhale 3 L/min into the lungs at bedtime. Continuously    . potassium chloride SA (K-DUR,KLOR-CON) 20 MEQ tablet Take 1 tablet (20 mEq total) by mouth daily as needed (take when taking lasix). 30 tablet 3  . rivaroxaban (XARELTO) 20 MG TABS tablet Take 1 tablet (20 mg total) by mouth daily with supper. 90 tablet 3  . traMADol (ULTRAM) 50 MG tablet Take 1 tablet (50 mg total) by mouth every 6 (six) hours as needed. 120 tablet 5  . Vitamin D, Ergocalciferol, (DRISDOL) 50000 units CAPS capsule Take 1 capsule (50,000 Units total) by mouth every 7 (seven) days. 4 capsule 0   No current facility-administered medications on file prior to visit.     PAST MEDICAL HISTORY: Past Medical History:  Diagnosis Date  . Anxiety   . Arthritis   . Bilateral bunions   . Complication of anesthesia   . Difficulty sleeping    prior sleep study did not reveal sleep apnea per patient  . DJD (degenerative joint disease)   . Dyspnea   . Dysrhythmia    a-fib  . Environmental allergies   . Food allergy   . Gallbladder problem   . GERD (gastroesophageal reflux disease)   . Heart murmur   . Hypertension    no meds after weight loss  . Hypothyroidism   . Incontinence of urine    at nite   . Joint pain   . Left atrial dilatation   . Left foot pain   . Leg edema   . Obesity    s/p gastric sleeve 12/2013 (previously weighed close to 400 lbs)  . Osteoarthritis   . Palpitations   . Paroxysmal atrial fibrillation (HCC)   . Pneumonia    hx of   . PONV (postoperative nausea and vomiting)   . Status post bilateral knee replacements   . Supplemental oxygen dependent    uses 2l/[place] at  night, states HR goes low and O2 drops  . Tuberculosis    had 6 month of INH due to exposure   . Vaginal vault prolapse     PAST SURGICAL HISTORY: Past Surgical History:  Procedure Laterality Date  . APPENDECTOMY    . CHOLECYSTECTOMY    . EYE SURGERY     03/2014 lens implant left lens   . FOOT ARTHRODESIS Left 03/15/2016   Procedure: TALONAVICULAR AND SUBTALAR ARTHRODESIS;  Surgeon: Wylene Simmer, MD;  Location: [facility];  Service: Orthopedics;  Laterality: Left;  Marland Kitchen GASTROC RECESSION EXTREMITY Left  03/15/2016   Procedure: LEFT GASTROC RECESSION;  Surgeon: Wylene Simmer, MD;  Location: [facility];  Service: Orthopedics;  Laterality: Left;  . JOINT REPLACEMENT  1999   L TOTAL KNEE  . LAPAROSCOPIC GASTRIC SLEEVE RESECTION N/A 01/04/2014   Procedure: LAPAROSCOPIC GASTRIC SLEEVE RESECTION;  Surgeon: Greer Pickerel, MD;  Location: [facility];  Service: General;  Laterality: N/A;  . TONSILLECTOMY    . TOTAL KNEE ARTHROPLASTY Right 05/17/2015   Procedure: RIGHT TOTAL KNEE ARTHROPLASTY;  Surgeon: Paralee Cancel, MD;  Location: [facility];  Service: Orthopedics;  Laterality: Right;  . TRANSTHORACIC ECHOCARDIOGRAM  11/2012   EF 60-65%, mod LVH & mod conc hypertrophy, grade 1 diastolic dysfunction; LA mildly dilated; RV systolic pressure increased; PA peak pressure 66mmHg  . TUBAL LIGATION    . UPPER GI ENDOSCOPY  01/04/2014   Procedure: UPPER GI ENDOSCOPY;  Surgeon: Greer Pickerel, MD;  Location: [facility];  Service: General;;    SOCIAL HISTORY: Social History   Tobacco Use  . Smoking status: Never Smoker  . Smokeless tobacco: Never Used  Substance Use Topics  . Alcohol use: No  . Drug use: No    FAMILY HISTORY: Family History  Problem Relation Age of Onset  . Diabetes Father   . Hyperlipidemia Father   . Hypertension Father   . Heart disease Father   . Sudden death Father   . Anxiety disorder Father   . Obesity Father   . Uterine cancer Mother   . Cervical cancer  Mother   . Breast cancer Mother   . Cancer Mother        cervical and brreast cancer  . Hypertension Mother   . Hyperlipidemia Mother   . Obesity Mother   . Diabetes Maternal Grandmother     ROS: Review of Systems  Constitutional: Positive for weight loss. Negative for malaise/fatigue.       Negative hot/cold intolerance  Respiratory: Negative for shortness of breath.   Cardiovascular: Negative for chest pain, palpitations and claudication.  Gastrointestinal: Negative for nausea and vomiting.  Musculoskeletal: Negative for myalgias.       Negative muscle weakness  Endo/Heme/Allergies:       Negative polyphagia Negative hypoglycemia    PHYSICAL EXAM: Blood pressure (!) 156/83, pulse 72, temperature 98.1 F (36.7 C), temperature source Oral, height 5\' 1"  (1.549 m), weight 260 lb (117.9 kg), SpO2 97 %. Body mass index is 49.13 kg/m. Physical Exam  Constitutional: She is oriented to person, place, and time. She appears well-developed and well-nourished.  Cardiovascular: Normal rate.  Pulmonary/Chest: Effort normal.  Musculoskeletal: Normal range of motion.  Neurological: She is oriented to person, place, and time.  Skin: Skin is warm and dry.  Psychiatric: She has a normal mood and affect. Her behavior is normal.  Vitals reviewed.   RECENT LABS AND TESTS: BMET    Component Value Date/Time   NA 144 01/01/2017 1335   K 4.2 01/01/2017 1335   CL 101 01/01/2017 1335   CO2 26 01/01/2017 1335   GLUCOSE 82 01/01/2017 1335   GLUCOSE 141 (H) 05/18/2015 0456   BUN 15 01/01/2017 1335   CREATININE 0.75 01/01/2017 1335   CALCIUM 9.7 01/01/2017 1335   GFRNONAA 86 01/01/2017 1335   GFRAA 99 01/01/2017 1335   Lab Results  Component Value Date   HGBA1C 5.9 (H) 01/01/2017   HGBA1C 6.1 (H) 07/02/2013   HGBA1C (H) 09/24/2006    6.9 (NOTE)   The ADA recommends the following therapeutic goals  for glycemic   control related to Hgb A1C measurement:   Goal of Therapy:   < 7.0% Hgb A1C    Action Suggested:  > 8.0% Hgb A1C   Ref:  Diabetes Care, 22, Suppl. 1, 1999   Lab Results  Component Value Date   INSULIN 9.8 01/01/2017   CBC    Component Value Date/Time   WBC 7.6 01/01/2017 1335   WBC 8.0 05/18/2015 0456   RBC 4.91 01/01/2017 1335   RBC 3.29 (L) 05/18/2015 0456   HGB 13.6 01/01/2017 1335   HCT 42.6 01/01/2017 1335   PLT 117 (L) 05/18/2015 0456   MCV 87 01/01/2017 1335   MCH 27.7 01/01/2017 1335   MCH 29.2 05/18/2015 0456   MCHC 31.9 01/01/2017 1335   MCHC 32.3 05/18/2015 0456   RDW 14.7 01/01/2017 1335   LYMPHSABS 1.9 01/01/2017 1335   MONOABS 0.8 03/26/2015 0316   EOSABS 0.2 01/01/2017 1335   BASOSABS 0.1 01/01/2017 1335   Iron/TIBC/Ferritin/ %Sat No results found for: IRON, TIBC, FERRITIN, IRONPCTSAT Lipid Panel     Component Value Date/Time   CHOL 207 (H) 01/01/2017 1335   TRIG 139 01/01/2017 1335   HDL 73 01/01/2017 1335   CHOLHDL 4.2 07/02/2013 1301   VLDL 43 (H) 07/02/2013 1301   LDLCALC 106 (H) 01/01/2017 1335   Hepatic Function Panel     Component Value Date/Time   PROT 7.7 01/01/2017 1335   ALBUMIN 4.5 01/01/2017 1335   AST 25 01/01/2017 1335   ALT 22 01/01/2017 1335   ALKPHOS 84 01/01/2017 1335   BILITOT 0.4 01/01/2017 1335      Component Value Date/Time   TSH 2.350 01/01/2017 1335   TSH 1.523 07/20/2014 1546   TSH 1.100 01/04/2014 2100  Results for DEQUITA, SCHLEICHER (MRN [id number]) as of 05/02/2017 10:05  Ref. Range 01/01/2017 13:35  Vitamin D, 25-Hydroxy Latest Ref Range: 30.0 - 100.0 ng/mL 25.6 (L)    ASSESSMENT AND PLAN: Essential hypertension  Vitamin D deficiency - Plan: VITAMIN D 25 Hydroxy (Vit-D Deficiency, Fractures)  Prediabetes - Plan: Comprehensive metabolic panel, Hemoglobin A1c, Insulin, random  Other hyperlipidemia - Plan: Lipid Panel With LDL/HDL Ratio  Other specified hypothyroidism - Plan: T3, T4, free, TSH  Class 3 severe obesity with serious comorbidity and body mass index (BMI) of 45.0 to 49.9 in  adult, unspecified obesity type (HCC)  PLAN:  Hypertension We discussed sodium restriction, working on healthy weight loss, and a regular exercise program as the means to achieve improved blood pressure control. Taraya agreed with this plan and agreed to follow up as directed. We will continue to monitor her blood pressure as well as her progress with the above lifestyle modifications. She will continue her medications as prescribed and will watch for signs of hypotension as she continues her lifestyle modifications. Noelia agrees to follow up with our clinic in 2 weeks.  Vitamin D Deficiency Tinisha was informed that low vitamin D levels contributes to fatigue and are associated with obesity, breast, and colon cancer. Juanetta agrees to continue taking prescription Vit D @50 ,000 IU every week #4 and will follow up for routine testing of vitamin D, at least 2-3 times per year. She was informed of the risk of over-replacement of vitamin D and agrees to not increase her dose unless she discusses this with [name] first. Rebekah [name] agrees to follow up with our clinic in 2 weeks.  Pre-Diabetes Rebekah [name] will continue to work on weight loss, diet, exercise, and decreasing simple carbohydrates  in her diet to help decrease the risk of diabetes. We dicussed metformin including benefits and risks. She was informed that eating too many simple carbohydrates or too many calories at one sitting increases the likelihood of GI side effects. Rebekah [name] declined metformin for now and a prescription was not written today. We will check labs and Rebekah [name] agrees to follow up with our clinic in 2 weeks as directed to monitor her progress.  Hyperlipidemia Rebekah [name] was informed of the American Heart Association Guidelines emphasizing intensive lifestyle modifications as the first line treatment for hyperlipidemia. We discussed many lifestyle modifications today in depth, and Rebekah [name] will continue to work on decreasing saturated fats such as fatty red meat, butter and  many fried foods. She will also increase vegetables and lean protein in her diet and continue to work on diet, exercise, and weight loss efforts. We will check labs and Rebekah [name] agrees to follow up with our clinic in 2 weeks.  Hypothyroidism Rebekah [name] was informed of the importance of good thyroid control to help with weight loss efforts. She was also informed that supertheraputic thyroid levels are dangerous and will not improve weight loss results. Rebekah [name] agrees to continue her medications as prescribed. We will check labs and Rebekah [name] agrees to follow up with our clinic in 2 weeks.  Obesity Rebekah [name] is currently in the action stage of change. As such, her goal is to continue with weight loss efforts She has agreed to keep a food journal with 1200 calories and 85 grams of protein daily Reverie has been instructed to work up to a goal of 150 minutes of combined cardio and strengthening exercise per week for weight loss and overall health benefits. We discussed the following Behavioral Modification Strategies today: increasing lean protein intake and work on meal planning and easy cooking plans   Latysha has agreed to follow up with our clinic in 2 weeks. She was informed of the importance of frequent follow up visits to maximize her success with intensive lifestyle modifications for her multiple health conditions.    OBESITY BEHAVIORAL INTERVENTION VISIT  Today's visit was # 6 out of 22.  Starting weight: 267 lbs Starting date: 01/01/17 Today's weight : 260 lbs  Today's date: 05/02/2017 Total lbs lost to date: 7 (Patients must lose 7 lbs in the first 6 months to continue with counseling)   ASK: We discussed the diagnosis of obesity with [name] today and Valeree agreed to give [name] permission to discuss obesity behavioral modification therapy today.  ASSESS: Samauri has the diagnosis of obesity and her BMI today is 49.15 Neha is in the action stage of change   ADVISE: Cystal was educated on the multiple health  risks of obesity as well as the benefit of weight loss to improve her health. She was advised of the need for long term treatment and the importance of lifestyle modifications.  AGREE: Multiple dietary modification options and treatment options were discussed and  Nohea agreed to the above obesity treatment plan.   Wilhemena Durie, am acting as transcriptionist for Lacy Duverney, PA-C I, Lacy Duverney [facility] have reviewed this note and agree with its content  I have reviewed the above documentation for accuracy and completeness, and I agree with the above. -Dennard Nip, MD

## 2017-05-03 ENCOUNTER — Telehealth: Payer: Self-pay | Admitting: Internal Medicine

## 2017-05-03 NOTE — Telephone Encounter (Signed)
Incoming call from [employer]. She wanted to verify that the patient needed the BMET. According to Epic, she had other labs ordered as well from another office. [facility] wanted to inform that the labs were drawn and will be resulted together.

## 2017-05-04 ENCOUNTER — Encounter (INDEPENDENT_AMBULATORY_CARE_PROVIDER_SITE_OTHER): Payer: Self-pay | Admitting: Physician Assistant

## 2017-05-04 LAB — BASIC METABOLIC PANEL
BUN / CREAT RATIO: 28 (ref 12–28)
BUN: 21 mg/dL (ref 8–27)
CO2: 24 mmol/L (ref 20–29)
CREATININE: 0.75 mg/dL (ref 0.57–1.00)
Calcium: 10.1 mg/dL (ref 8.7–10.3)
Chloride: 102 mmol/L (ref 96–106)
GFR calc Af Amer: 99 mL/min/{1.73_m2} (ref >59)
GFR calc non Af Amer: 86 mL/min/{1.73_m2} (ref >59)
GLUCOSE: 89 mg/dL (ref 65–99)
Potassium: 5 mmol/L (ref 3.5–5.2)
Sodium: 146 mmol/L — ABNORMAL HIGH (ref 134–144)

## 2017-05-07 ENCOUNTER — Other Ambulatory Visit: Payer: Self-pay | Admitting: Physician Assistant

## 2017-05-13 ENCOUNTER — Encounter: Payer: Self-pay | Admitting: *Deleted

## 2017-05-15 LAB — COMPREHENSIVE METABOLIC PANEL WITH GFR
ALT: 42 IU/L — ABNORMAL HIGH (ref 0–32)
AST: 55 IU/L — ABNORMAL HIGH (ref 0–40)
Albumin/Globulin Ratio: 1.4 (ref 1.2–2.2)
Albumin: 4.4 g/dL (ref 3.6–4.8)
Alkaline Phosphatase: 81 IU/L (ref 39–117)
BUN/Creatinine Ratio: 24 (ref 12–28)
BUN: 20 mg/dL (ref 8–27)
Bilirubin Total: <0.2 mg/dL (ref 0.0–1.2)
CO2: 24 mmol/L (ref 20–29)
Calcium: 10.1 mg/dL (ref 8.7–10.3)
Chloride: 102 mmol/L (ref 96–106)
Creatinine, Ser: 0.82 mg/dL (ref 0.57–1.00)
GFR calc Af Amer: 89 mL/min/1.73
GFR calc non Af Amer: 77 mL/min/1.73
Globulin, Total: 3.2 g/dL (ref 1.5–4.5)
Glucose: 91 mg/dL (ref 65–99)
Potassium: 5 mmol/L (ref 3.5–5.2)
Sodium: 143 mmol/L (ref 134–144)
Total Protein: 7.6 g/dL (ref 6.0–8.5)

## 2017-05-15 LAB — LIPID PANEL WITH LDL/HDL RATIO
CHOLESTEROL TOTAL: 189 mg/dL (ref 100–199)
HDL: 67 mg/dL (ref >39)
LDL Calculated: 101 mg/dL — ABNORMAL HIGH (ref 0–99)
LDl/HDL Ratio: 1.5 ratio (ref 0.0–3.2)
Triglycerides: 103 mg/dL (ref 0–149)
VLDL Cholesterol Cal: 21 mg/dL (ref 5–40)

## 2017-05-15 LAB — INSULIN, RANDOM: INSULIN: 7.9 u[IU]/mL (ref 2.6–24.9)

## 2017-05-15 LAB — HEMOGLOBIN A1C
Est. average glucose Bld gHb Est-mCnc: 111 mg/dL
Hgb A1c MFr Bld: 5.5 % (ref 4.8–5.6)

## 2017-05-15 LAB — VITAMIN D 25 HYDROXY (VIT D DEFICIENCY, FRACTURES): Vit D, 25-Hydroxy: 43.5 ng/mL (ref 30.0–100.0)

## 2017-05-15 LAB — T3: T3 TOTAL: 108 ng/dL (ref 71–180)

## 2017-05-15 LAB — SPECIMEN STATUS REPORT

## 2017-05-15 LAB — T4, FREE: FREE T4: 1.14 ng/dL (ref 0.82–1.77)

## 2017-05-15 LAB — TSH: TSH: 3.09 u[IU]/mL (ref 0.450–4.500)

## 2017-05-16 ENCOUNTER — Ambulatory Visit (INDEPENDENT_AMBULATORY_CARE_PROVIDER_SITE_OTHER): Payer: BC Managed Care – PPO | Admitting: Physician Assistant

## 2017-05-16 VITALS — BP 150/91 | HR 72 | Temp 98.4°F | Ht 61.0 in | Wt 257.0 lb

## 2017-05-16 DIAGNOSIS — Z9189 Other specified personal risk factors, not elsewhere classified: Secondary | ICD-10-CM | POA: Diagnosis not present

## 2017-05-16 DIAGNOSIS — E559 Vitamin D deficiency, unspecified: Secondary | ICD-10-CM

## 2017-05-16 DIAGNOSIS — R7989 Other specified abnormal findings of blood chemistry: Secondary | ICD-10-CM

## 2017-05-16 DIAGNOSIS — E66813 Obesity, class 3: Secondary | ICD-10-CM

## 2017-05-16 DIAGNOSIS — Z6841 Body Mass Index (BMI) 40.0 and over, adult: Secondary | ICD-10-CM

## 2017-05-16 DIAGNOSIS — R945 Abnormal results of liver function studies: Secondary | ICD-10-CM

## 2017-05-16 MED ORDER — VITAMIN D (ERGOCALCIFEROL) 1.25 MG (50000 UNIT) PO CAPS: 50000.0000 [IU] | ORAL_CAPSULE | ORAL | 0 refills | Status: DC

## 2017-05-16 NOTE — Progress Notes (Signed)
Office: [phone]  /  Fax: [phone]   HPI:   Chief Complaint: OBESITY Nyeshia is here to discuss her progress with her obesity treatment plan. She is on the Category 2 plan and is following her eating plan approximately 100 % of the time. She states she is walking the dog 1/2 mile 7 times per week. Jacquelinne continues to do well with weight loss. She has been very mindful of her eating. She would like to incorporate variety with her dinner meal and would like to journal the dinner meal.  Her weight is 257 lb (116.6 kg) today and has had a weight loss of 3 pounds over a period of 2 weeks since her last visit. She has lost 10 lbs since starting treatment with [name].  Vitamin D deficiency Sorayah has a diagnosis of vitamin D deficiency. She is on prescription Vit D. Vit D improved to 43.5 and she denies nausea, vomiting or muscle weakness.  At risk for osteopenia and osteoporosis Tracina is at higher risk of osteopenia and osteoporosis due to vitamin D deficiency.   Elevated LFT Conny has a diagnosis of elevated LFTs. AST/ALT elevated at 55 and 42 respectively.Last labs,  LFTs were within normal limits. Cesilia is at risk of fatty liver disease with obesity. Her BMI is over 40. She denies abdominal pain or jaundice and has never been told of any liver problems in the past. She denies excessive alcohol intake.  ALLERGIES: Allergies  Allergen Reactions  . Beef-Derived Products Anaphylaxis    After tick bite, cannot eat beef, pork or lamb  . Lambs Quarters Anaphylaxis    After tick bite cannot eat beef, pork or lamb  . Mucinex [Guaifenesin Er] Anaphylaxis  . Pork-Derived Products Anaphylaxis    After tick bite cannot eat beef, pork or lamb  . Darvon [Propoxyphene] Other (See Comments)    Hallucinations   . Ace Inhibitors Swelling and Cough    Pedal Edema  . Hydromet [Hydrocodone-Homatropine] Itching and Other (See Comments)    Severe stomach pain-face itches.   . Sulfonamide Derivatives Rash     MEDICATIONS: Current Outpatient Medications on File Prior to Visit  Medication Sig Dispense Refill  . acetaminophen (TYLENOL) 500 MG tablet Take 500-1,000 mg by mouth every 6 (six) hours as needed for moderate pain or headache.    Marland Kitchen CALCIUM PO Take 500 mg by mouth daily.     . cetirizine (ZYRTEC) 10 MG tablet Take 10 mg by mouth at bedtime.     Marland Kitchen diltiazem (CARDIZEM) 30 MG tablet Take 1-2 tablets by mouth every 6 hours as needed for fast heart rates 30 tablet 1  . diltiazem (CARTIA XT) 120 MG 24 hr capsule Take 1 capsule (120 mg total) by mouth daily. 90 capsule 3  . diphenhydrAMINE (BENADRYL) 50 MG tablet Take 50 mg by mouth at bedtime as needed for itching.    Marland Kitchen EPINEPHrine (EPIPEN) 0.3 mg/0.3 mL IJ SOAJ injection Inject 0.3 mLs (0.3 mg total) into the muscle once as needed (anaphylaxis). 1 Device 6  . escitalopram (LEXAPRO) 10 MG tablet TAKE 1 TABLET BY MOUTH ONCE DAILY 90 tablet 3  . flecainide (TAMBOCOR) 100 MG tablet Take 3 tablets by mouth at onset of fast heart rates as needed 30 tablet 1  . furosemide (LASIX) 20 MG tablet Take 1 tablet (20 mg total) by mouth daily as needed for edema. 30 tablet 3  . levothyroxine (SYNTHROID, LEVOTHROID) 50 MCG tablet TAKE 1 TABLET BY MOUTH ONCE DAILY BEFORE  BREAKFAST 90 tablet 2  . losartan (COZAAR) 50 MG tablet Take 1/2 tablet by mouth each morning and 1 tablet each evening 135 tablet 1  . Multiple Vitamins-Minerals (MULTIVITAMIN ADULT PO) Take 1 capsule by mouth 2 (two) times daily. Journey 3+3 Bariatric Multivitamin    . OXYGEN Inhale 3 L/min into the lungs at bedtime. Continuously    . potassium chloride SA (K-DUR,KLOR-CON) 20 MEQ tablet Take 1 tablet (20 mEq total) by mouth daily as needed (take when taking lasix). 30 tablet 3  . rivaroxaban (XARELTO) 20 MG TABS tablet Take 1 tablet (20 mg total) by mouth daily with supper. 90 tablet 3  . traMADol (ULTRAM) 50 MG tablet Take 1 tablet (50 mg total) by mouth every 6 (six) hours as needed. 120  tablet 5  . Vitamin D, Ergocalciferol, (DRISDOL) 50000 units CAPS capsule Take 1 capsule (50,000 Units total) by mouth every 7 (seven) days. 4 capsule 0   No current facility-administered medications on file prior to visit.     PAST MEDICAL HISTORY: Past Medical History:  Diagnosis Date  . Anxiety   . Arthritis   . Bilateral bunions   . Complication of anesthesia   . Difficulty sleeping    prior sleep study did not reveal sleep apnea per patient  . DJD (degenerative joint disease)   . Dyspnea   . Dysrhythmia    a-fib  . Environmental allergies   . Food allergy   . Gallbladder problem   . GERD (gastroesophageal reflux disease)   . Heart murmur   . Hypertension    no meds after weight loss  . Hypothyroidism   . Incontinence of urine    at nite   . Joint pain   . Left atrial dilatation   . Left foot pain   . Leg edema   . Obesity    s/p gastric sleeve 12/2013 (previously weighed close to 400 lbs)  . Osteoarthritis   . Palpitations   . Paroxysmal atrial fibrillation (HCC)   . Pneumonia    hx of   . PONV (postoperative nausea and vomiting)   . Status post bilateral knee replacements   . Supplemental oxygen dependent    uses 2l/[place] at night, states HR goes low and O2 drops  . Tuberculosis    had 6 month of INH due to exposure   . Vaginal vault prolapse     PAST SURGICAL HISTORY: Past Surgical History:  Procedure Laterality Date  . APPENDECTOMY    . CHOLECYSTECTOMY    . EYE SURGERY     03/2014 lens implant left lens   . FOOT ARTHRODESIS Left 03/15/2016   Procedure: TALONAVICULAR AND SUBTALAR ARTHRODESIS;  Surgeon: Wylene Simmer, MD;  Location: [place];  Service: Orthopedics;  Laterality: Left;  Marland Kitchen GASTROC RECESSION EXTREMITY Left 03/15/2016   Procedure: LEFT GASTROC RECESSION;  Surgeon: Wylene Simmer, MD;  Location: [facility];  Service: Orthopedics;  Laterality: Left;  . JOINT REPLACEMENT  1999   L TOTAL KNEE  . LAPAROSCOPIC GASTRIC  SLEEVE RESECTION N/A 01/04/2014   Procedure: LAPAROSCOPIC GASTRIC SLEEVE RESECTION;  Surgeon: Greer Pickerel, MD;  Location: [facility];  Service: General;  Laterality: N/A;  . TONSILLECTOMY    . TOTAL KNEE ARTHROPLASTY Right 05/17/2015   Procedure: RIGHT TOTAL KNEE ARTHROPLASTY;  Surgeon: Paralee Cancel, MD;  Location: [facility];  Service: Orthopedics;  Laterality: Right;  . TRANSTHORACIC ECHOCARDIOGRAM  11/2012   EF 60-65%, mod LVH & mod conc hypertrophy, grade  1 diastolic dysfunction; LA mildly dilated; RV systolic pressure increased; PA peak pressure 27mmHg  . TUBAL LIGATION    . UPPER GI ENDOSCOPY  01/04/2014   Procedure: UPPER GI ENDOSCOPY;  Surgeon: Greer Pickerel, MD;  Location: [facility];  Service: General;;    SOCIAL HISTORY: Social History   Tobacco Use  . Smoking status: Never Smoker  . Smokeless tobacco: Never Used  Substance Use Topics  . Alcohol use: No  . Drug use: No    FAMILY HISTORY: Family History  Problem Relation Age of Onset  . Diabetes Father   . Hyperlipidemia Father   . Hypertension Father   . Heart disease Father   . Sudden death Father   . Anxiety disorder Father   . Obesity Father   . Uterine cancer Mother   . Cervical cancer Mother   . Breast cancer Mother   . Cancer Mother        cervical and brreast cancer  . Hypertension Mother   . Hyperlipidemia Mother   . Obesity Mother   . Diabetes Maternal Grandmother     ROS: Review of Systems  Constitutional: Positive for weight loss.  Eyes:       Negative jaundice  Gastrointestinal: Negative for abdominal pain, nausea and vomiting.  Musculoskeletal:       Negative muscle weakness    PHYSICAL EXAM: Blood pressure (!) 150/91, pulse 72, temperature 98.4 F (36.9 C), temperature source Oral, height 5\' 1"  (1.549 m), weight 257 lb (116.6 kg), SpO2 97 %. Body mass index is 48.56 kg/m. Physical Exam  Constitutional: She is oriented to person, place, and time. She appears well-developed and well-nourished.   Cardiovascular: Normal rate.  Pulmonary/Chest: Effort normal.  Musculoskeletal: Normal range of motion.  Neurological: She is oriented to person, place, and time.  Skin: Skin is warm and dry.  Psychiatric: She has a normal mood and affect. Her behavior is normal.  Vitals reviewed.   RECENT LABS AND TESTS: BMET    Component Value Date/Time   NA 146 (H) 05/02/2017 0850   NA 143 05/02/2017 0850   K 5.0 05/02/2017 0850   K 5.0 05/02/2017 0850   CL 102 05/02/2017 0850   CL 102 05/02/2017 0850   CO2 24 05/02/2017 0850   CO2 24 05/02/2017 0850   GLUCOSE 89 05/02/2017 0850   GLUCOSE 91 05/02/2017 0850   GLUCOSE 141 (H) 05/18/2015 0456   BUN 21 05/02/2017 0850   BUN 20 05/02/2017 0850   CREATININE 0.75 05/02/2017 0850   CREATININE 0.82 05/02/2017 0850   CALCIUM 10.1 05/02/2017 0850   CALCIUM 10.1 05/02/2017 0850   GFRNONAA 86 05/02/2017 0850   GFRNONAA 77 05/02/2017 0850   GFRAA 99 05/02/2017 0850   GFRAA 89 05/02/2017 0850   Lab Results  Component Value Date   HGBA1C 5.5 05/02/2017   HGBA1C 5.9 (H) 01/01/2017   HGBA1C 6.1 (H) 07/02/2013   HGBA1C (H) 09/24/2006    6.9 (NOTE)   The ADA recommends the following therapeutic goals for glycemic   control related to Hgb A1C measurement:   Goal of Therapy:   < 7.0% Hgb A1C   Action Suggested:  > 8.0% Hgb A1C   Ref:  Diabetes Care, 22, Suppl. 1, 1999   Lab Results  Component Value Date   INSULIN 7.9 05/02/2017   INSULIN 9.8 01/01/2017   CBC    Component Value Date/Time   WBC 7.6 01/01/2017 1335   WBC 8.0 05/18/2015 0456   RBC  4.91 01/01/2017 1335   RBC 3.29 (L) 05/18/2015 0456   HGB 13.6 01/01/2017 1335   HCT 42.6 01/01/2017 1335   PLT 117 (L) 05/18/2015 0456   MCV 87 01/01/2017 1335   MCH 27.7 01/01/2017 1335   MCH 29.2 05/18/2015 0456   MCHC 31.9 01/01/2017 1335   MCHC 32.3 05/18/2015 0456   RDW 14.7 01/01/2017 1335   LYMPHSABS 1.9 01/01/2017 1335   MONOABS 0.8 03/26/2015 0316   EOSABS 0.2 01/01/2017 1335    BASOSABS 0.1 01/01/2017 1335   Iron/TIBC/Ferritin/ %Sat No results found for: IRON, TIBC, FERRITIN, IRONPCTSAT Lipid Panel     Component Value Date/Time   CHOL 189 05/02/2017 0850   TRIG 103 05/02/2017 0850   HDL 67 05/02/2017 0850   CHOLHDL 4.2 07/02/2013 1301   VLDL 43 (H) 07/02/2013 1301   LDLCALC 101 (H) 05/02/2017 0850   Hepatic Function Panel     Component Value Date/Time   PROT 7.6 05/02/2017 0850   ALBUMIN 4.4 05/02/2017 0850   AST 55 (H) 05/02/2017 0850   ALT 42 (H) 05/02/2017 0850   ALKPHOS 81 05/02/2017 0850   BILITOT <0.2 05/02/2017 0850      Component Value Date/Time   TSH 3.090 05/02/2017 0850   TSH 2.350 01/01/2017 1335   TSH 1.523 07/20/2014 1546  Results for Szymanowski, GLENNIS BORGER (MRN [id number]) as of 05/16/2017 10:38  Ref. Range 05/02/2017 08:50  Vitamin D, 25-Hydroxy Latest Ref Range: 30.0 - 100.0 ng/mL 43.5    ASSESSMENT AND PLAN: Vitamin D deficiency - Plan: Vitamin D, Ergocalciferol, (DRISDOL) 50000 units CAPS capsule  Elevated LFTs  At risk for osteoporosis  Class 3 severe obesity with serious comorbidity and body mass index (BMI) of 45.0 to 49.9 in adult, unspecified obesity type ([place])  PLAN:  Vitamin D Deficiency Khamryn was informed that low vitamin D levels contributes to fatigue and are associated with obesity, breast, and colon cancer. Anacristina agrees to continue taking prescription Vit D @50 ,000 IU every week #4 and we will refill for 1 month. She will follow up for routine testing of vitamin D, at least 2-3 times per year. She was informed of the risk of over-replacement of vitamin D and agrees to not increase her dose unless she discusses this with [name] first. Marvelene agrees to follow up with our clinic in 2 weeks.  At risk for osteopenia and osteoporosis Emrys is at risk for osteopenia and osteoporsis due to her vitamin D deficiency. She was encouraged to take her vitamin D and follow her higher calcium diet and increase strengthening exercise to help  strengthen her bones and decrease her risk of osteopenia and osteoporosis.  Elevated LFT We discussed the likely diagnosis of non alcoholic fatty liver disease today and how this condition is obesity related. Jaimy was educated on her risk of developing NASH or even liver failure and th only proven treatment for NAFLD was weight loss. Maleigh agrees to continue with her weight loss efforts with healthier diet and exercise as an essential part of her treatment plan. We will recheck labs in 3 months and Viona agrees to follow up with our clinic in 2 weeks.  Obesity Dayra is currently in the action stage of change. As such, her goal is to continue with weight loss efforts She has agreed to keep a food journal with 500-600 calories and 40 grams of protein at supper daily and follow the Category 2 plan Atlas has been instructed to work up to a goal of 150  minutes of combined cardio and strengthening exercise per week for weight loss and overall health benefits. We discussed the following Behavioral Modification Strategies today: increasing lean protein intake and keep a strict food journal   Kearston has agreed to follow up with our clinic in 2 weeks. She was informed of the importance of frequent follow up visits to maximize her success with intensive lifestyle modifications for her multiple health conditions.   OBESITY BEHAVIORAL INTERVENTION VISIT  Today's visit was # 2 out of 22.  Starting weight: 267 lbs Starting date: 01/01/17 Today's weight : 257 lbs  Today's date: 05/16/2017 Total lbs lost to date: 10 (Patients must lose 7 lbs in the first 6 months to continue with counseling)   ASK: We discussed the diagnosis of obesity with [name] today and Eugena agreed to give [name] permission to discuss obesity behavioral modification therapy today.  ASSESS: Arriyah has the diagnosis of obesity and her BMI today is 48.58 Marycatherine is in the action stage of change   ADVISE: Cherika was educated on the multiple  health risks of obesity as well as the benefit of weight loss to improve her health. She was advised of the need for long term treatment and the importance of lifestyle modifications.  AGREE: Multiple dietary modification options and treatment options were discussed and  Shirleyann agreed to the above obesity treatment plan.   Wilhemena Durie, am acting as transcriptionist for Lacy Duverney, PA-C I, Lacy Duverney [facility], have reviewed this note and agree with its content.

## 2017-05-17 ENCOUNTER — Encounter: Payer: Self-pay | Admitting: Physician Assistant

## 2017-05-17 ENCOUNTER — Telehealth: Payer: Self-pay | Admitting: Physician Assistant

## 2017-05-30 ENCOUNTER — Ambulatory Visit (INDEPENDENT_AMBULATORY_CARE_PROVIDER_SITE_OTHER): Payer: BC Managed Care – PPO | Admitting: Physician Assistant

## 2017-05-30 VITALS — BP 142/89 | HR 68 | Temp 97.8°F | Ht 61.0 in | Wt 256.0 lb

## 2017-05-30 DIAGNOSIS — I10 Essential (primary) hypertension: Secondary | ICD-10-CM

## 2017-05-30 DIAGNOSIS — Z6841 Body Mass Index (BMI) 40.0 and over, adult: Secondary | ICD-10-CM | POA: Diagnosis not present

## 2017-05-30 NOTE — Progress Notes (Signed)
Office: [phone]  /  Fax: [phone]   HPI:   Chief Complaint: OBESITY Rebekah [name] is here to discuss her progress with her obesity treatment plan. She is on the  keep a food journal with 500-600 calories and 40 grams of  Protein at supper and follow the Category 2 plan and is following her eating plan approximately 99 % of the time. She states she is walking for 15 minutes 4 times per week. Rebekah [name] continues to do well with weight loss. She states she continues to struggle with eating all the protein in the meal plan. Also, states she has had an increase in cravings.  Her weight is 256 lb (116.1 kg) today and has had a weight loss of 1 pound over a period of 2 weeks since her last visit. She has lost 11 lbs since starting treatment with [name].  Hypertension Rebekah [name] is a 62 y.o. female with hypertension.  Rebekah [name] denies chest pain or shortness of breath on exertion. She is working weight loss to help control her blood pressure with the goal of decreasing her risk of heart attack and stroke. Rebekah [name] blood pressure is not currently controlled. She states her blood pressure at home is stable. She refuses any medicine adjustment.      ALLERGIES: Allergies  Allergen Reactions  . Beef-Derived Products Anaphylaxis    After tick bite, cannot eat beef, pork or lamb  . Lambs Quarters Anaphylaxis    After tick bite cannot eat beef, pork or lamb  . Mucinex [Guaifenesin Er] Anaphylaxis  . Pork-Derived Products Anaphylaxis    After tick bite cannot eat beef, pork or lamb  . Darvon [Propoxyphene] Other (See Comments)    Hallucinations   . Ace Inhibitors Swelling and Cough    Pedal Edema  . Hydromet [Hydrocodone-Homatropine] Itching and Other (See Comments)    Severe stomach pain-face itches.   . Sulfonamide Derivatives Rash    MEDICATIONS: Current Outpatient Medications on File Prior to Visit  Medication Sig Dispense Refill  . acetaminophen (TYLENOL) 500 MG tablet Take 500-1,000 mg by  mouth every 6 (six) hours as needed for moderate pain or headache.    Marland Kitchen CALCIUM PO Take 500 mg by mouth daily.     . cetirizine (ZYRTEC) 10 MG tablet Take 10 mg by mouth at bedtime.     Marland Kitchen diltiazem (CARDIZEM) 30 MG tablet Take 1-2 tablets by mouth every 6 hours as needed for fast heart rates 30 tablet 1  . diltiazem (CARTIA XT) 120 MG 24 hr capsule Take 1 capsule (120 mg total) by mouth daily. 90 capsule 3  . diphenhydrAMINE (BENADRYL) 50 MG tablet Take 50 mg by mouth at bedtime as needed for itching.    Marland Kitchen EPINEPHrine (EPIPEN) 0.3 mg/0.3 mL IJ SOAJ injection Inject 0.3 mLs (0.3 mg total) into the muscle once as needed (anaphylaxis). 1 Device 6  . escitalopram (LEXAPRO) 10 MG tablet TAKE 1 TABLET BY MOUTH ONCE DAILY 90 tablet 3  . flecainide (TAMBOCOR) 100 MG tablet Take 3 tablets by mouth at onset of fast heart rates as needed 30 tablet 1  . furosemide (LASIX) 20 MG tablet Take 1 tablet (20 mg total) by mouth daily as needed for edema. 30 tablet 3  . levothyroxine (SYNTHROID, LEVOTHROID) 50 MCG tablet TAKE 1 TABLET BY MOUTH ONCE DAILY BEFORE BREAKFAST 90 tablet 2  . losartan (COZAAR) 50 MG tablet Take 1/2 tablet by mouth each morning and 1 tablet each evening 135 tablet 1  .  Multiple Vitamins-Minerals (MULTIVITAMIN ADULT PO) Take 1 capsule by mouth 2 (two) times daily. Journey 3+3 Bariatric Multivitamin    . OXYGEN Inhale 3 L/min into the lungs at bedtime. Continuously    . potassium chloride SA (K-DUR,KLOR-CON) 20 MEQ tablet Take 1 tablet (20 mEq total) by mouth daily as needed (take when taking lasix). 30 tablet 3  . rivaroxaban (XARELTO) 20 MG TABS tablet Take 1 tablet (20 mg total) by mouth daily with supper. 90 tablet 3  . traMADol (ULTRAM) 50 MG tablet Take 1 tablet (50 mg total) by mouth every 6 (six) hours as needed. 120 tablet 5  . Vitamin D, Ergocalciferol, (DRISDOL) 50000 units CAPS capsule Take 1 capsule (50,000 Units total) by mouth every 7 (seven) days. 4 capsule 0   No current  facility-administered medications on file prior to visit.     PAST MEDICAL HISTORY: Past Medical History:  Diagnosis Date  . Anxiety   . Arthritis   . Bilateral bunions   . Complication of anesthesia   . Difficulty sleeping    prior sleep study did not reveal sleep apnea per patient  . DJD (degenerative joint disease)   . Dyspnea   . Dysrhythmia    a-fib  . Environmental allergies   . Food allergy   . Gallbladder problem   . GERD (gastroesophageal reflux disease)   . Heart murmur   . Hypertension    no meds after weight loss  . Hypothyroidism   . Incontinence of urine    at nite   . Joint pain   . Left atrial dilatation   . Left foot pain   . Leg edema   . Obesity    s/p gastric sleeve 12/2013 (previously weighed close to 400 lbs)  . Osteoarthritis   . Palpitations   . Paroxysmal atrial fibrillation (HCC)   . Pneumonia    hx of   . PONV (postoperative nausea and vomiting)   . Status post bilateral knee replacements   . Supplemental oxygen dependent    uses 2l/Groom at night, states HR goes low and O2 drops  . Tuberculosis    had 6 month of INH due to exposure   . Vaginal vault prolapse     PAST SURGICAL HISTORY: Past Surgical History:  Procedure Laterality Date  . APPENDECTOMY    . CHOLECYSTECTOMY    . EYE SURGERY     03/2014 lens implant left lens   . FOOT ARTHRODESIS Left 03/15/2016   Procedure: TALONAVICULAR AND SUBTALAR ARTHRODESIS;  Surgeon: Wylene Simmer, MD;  Location: [place];  Service: Orthopedics;  Laterality: Left;  Marland Kitchen GASTROC RECESSION EXTREMITY Left 03/15/2016   Procedure: LEFT GASTROC RECESSION;  Surgeon: Wylene Simmer, MD;  Location: [place];  Service: Orthopedics;  Laterality: Left;  . JOINT REPLACEMENT  1999   L TOTAL KNEE  . LAPAROSCOPIC GASTRIC SLEEVE RESECTION N/A 01/04/2014   Procedure: LAPAROSCOPIC GASTRIC SLEEVE RESECTION;  Surgeon: Greer Pickerel, MD;  Location: [facility];  Service: General;  Laterality: N/A;  .  TONSILLECTOMY    . TOTAL KNEE ARTHROPLASTY Right 05/17/2015   Procedure: RIGHT TOTAL KNEE ARTHROPLASTY;  Surgeon: Paralee Cancel, MD;  Location: [facility];  Service: Orthopedics;  Laterality: Right;  . TRANSTHORACIC ECHOCARDIOGRAM  11/2012   EF 60-65%, mod LVH & mod conc hypertrophy, grade 1 diastolic dysfunction; LA mildly dilated; RV systolic pressure increased; PA peak pressure 21mmHg  . TUBAL LIGATION    . UPPER GI ENDOSCOPY  01/04/2014  Procedure: UPPER GI ENDOSCOPY;  Surgeon: Greer Pickerel, MD;  Location: [facility];  Service: General;;    SOCIAL HISTORY: Social History   Tobacco Use  . Smoking status: Never Smoker  . Smokeless tobacco: Never Used  Substance Use Topics  . Alcohol use: No  . Drug use: No    FAMILY HISTORY: Family History  Problem Relation Age of Onset  . Diabetes Father   . Hyperlipidemia Father   . Hypertension Father   . Heart disease Father   . Sudden death Father   . Anxiety disorder Father   . Obesity Father   . Uterine cancer Mother   . Cervical cancer Mother   . Breast cancer Mother   . Cancer Mother        cervical and brreast cancer  . Hypertension Mother   . Hyperlipidemia Mother   . Obesity Mother   . Diabetes Maternal Grandmother     ROS: Review of Systems  Constitutional: Positive for weight loss.  Respiratory: Negative for shortness of breath.   Cardiovascular: Negative for chest pain.    PHYSICAL EXAM: Blood pressure (!) 142/89, pulse 68, temperature 97.8 F (36.6 C), temperature source Oral, height 5\' 1"  (1.549 m), weight 256 lb (116.1 kg), SpO2 98 %. Body mass index is 48.37 kg/m. Physical Exam  Constitutional: She is oriented to person, place, and time. She appears well-developed and well-nourished.  HENT:  Head: Normocephalic.  Eyes: EOM are normal.  Neck: Normal range of motion.  Cardiovascular: Normal rate.  Pulmonary/Chest: Effort normal.  Musculoskeletal: Normal range of motion.  Neurological: She is alert and oriented to  person, place, and time.  Skin: Skin is warm and dry.  Psychiatric: She has a normal mood and affect. Her behavior is normal.  Vitals reviewed.   RECENT LABS AND TESTS: BMET    Component Value Date/Time   NA 146 (H) 05/02/2017 0850   NA 143 05/02/2017 0850   K 5.0 05/02/2017 0850   K 5.0 05/02/2017 0850   CL 102 05/02/2017 0850   CL 102 05/02/2017 0850   CO2 24 05/02/2017 0850   CO2 24 05/02/2017 0850   GLUCOSE 89 05/02/2017 0850   GLUCOSE 91 05/02/2017 0850   GLUCOSE 141 (H) 05/18/2015 0456   BUN 21 05/02/2017 0850   BUN 20 05/02/2017 0850   CREATININE 0.75 05/02/2017 0850   CREATININE 0.82 05/02/2017 0850   CALCIUM 10.1 05/02/2017 0850   CALCIUM 10.1 05/02/2017 0850   GFRNONAA 86 05/02/2017 0850   GFRNONAA 77 05/02/2017 0850   GFRAA 99 05/02/2017 0850   GFRAA 89 05/02/2017 0850   Lab Results  Component Value Date   HGBA1C 5.5 05/02/2017   HGBA1C 5.9 (H) 01/01/2017   HGBA1C 6.1 (H) 07/02/2013   HGBA1C (H) 09/24/2006    6.9 (NOTE)   The ADA recommends the following therapeutic goals for glycemic   control related to Hgb A1C measurement:   Goal of Therapy:   < 7.0% Hgb A1C   Action Suggested:  > 8.0% Hgb A1C   Ref:  Diabetes Care, 22, Suppl. 1, 1999   Lab Results  Component Value Date   INSULIN 7.9 05/02/2017   INSULIN 9.8 01/01/2017   CBC    Component Value Date/Time   WBC 7.6 01/01/2017 1335   WBC 8.0 05/18/2015 0456   RBC 4.91 01/01/2017 1335   RBC 3.29 (L) 05/18/2015 0456   HGB 13.6 01/01/2017 1335   HCT 42.6 01/01/2017 1335   PLT 117 (L)  05/18/2015 0456   MCV 87 01/01/2017 1335   MCH 27.7 01/01/2017 1335   MCH 29.2 05/18/2015 0456   MCHC 31.9 01/01/2017 1335   MCHC 32.3 05/18/2015 0456   RDW 14.7 01/01/2017 1335   LYMPHSABS 1.9 01/01/2017 1335   MONOABS 0.8 03/26/2015 0316   EOSABS 0.2 01/01/2017 1335   BASOSABS 0.1 01/01/2017 1335   Iron/TIBC/Ferritin/ %Sat No results found for: IRON, TIBC, FERRITIN, IRONPCTSAT Lipid Panel     Component  Value Date/Time   CHOL 189 05/02/2017 0850   TRIG 103 05/02/2017 0850   HDL 67 05/02/2017 0850   CHOLHDL 4.2 07/02/2013 1301   VLDL 43 (H) 07/02/2013 1301   LDLCALC 101 (H) 05/02/2017 0850   Hepatic Function Panel     Component Value Date/Time   PROT 7.6 05/02/2017 0850   ALBUMIN 4.4 05/02/2017 0850   AST 55 (H) 05/02/2017 0850   ALT 42 (H) 05/02/2017 0850   ALKPHOS 81 05/02/2017 0850   BILITOT <0.2 05/02/2017 0850      Component Value Date/Time   TSH 3.090 05/02/2017 0850   TSH 2.350 01/01/2017 1335   TSH 1.523 07/20/2014 1546    ASSESSMENT AND PLAN: Essential hypertension  Class 3 severe obesity with serious comorbidity and body mass index (BMI) of 45.0 to 49.9 in adult, unspecified obesity type (HCC)  PLAN: Hypertension We discussed sodium restriction, working on healthy weight loss, and a regular exercise program as the means to achieve improved blood pressure control. Rebekah [name] agreed with this plan and agreed to follow up as directed. We will continue to monitor her blood pressure as well as her progress with the above lifestyle modifications. She will continue her medications as prescribed and will watch for signs of hypotension as she continues her lifestyle modifications.  Obesity Rebekah [name] is currently in the action stage of change. As such, her goal is to continue with weight loss efforts She has agreed to keep a food journal with 500-600 calories and 40 grams of protein at supper and follow the Category 2 plan. Rebekah [name] has been instructed to work up to a goal of 150 minutes of combined cardio and strengthening exercise per week for weight loss and overall health benefits. We discussed the following Behavioral Modification Stratagies today: increasing lean protein intake and work on meal planning and easy cooking plans   We spent > than 50% of the 15 minute visit on the counseling as documented in the note.    Rebekah [name] has agreed to follow up with our clinic in 2 weeks. She was  informed of the importance of frequent follow up visits to maximize her success with intensive lifestyle modifications for her multiple health conditions.    Today's visit was # 8 out of 22.  Starting weight: 267 lb Starting date: 01/01/17 Today's weight : 256 lb Today's date: 05/30/2017 Total lbs lost to date: 11 (Patients must lose 7 lbs in the first 6 months to continue with counseling)   ASK: We discussed the diagnosis of obesity with Rebekah [name] today and Rebekah [name] agreed to give [name] permission to discuss obesity behavioral modification therapy today.  ASSESS: Rebekah [name] has the diagnosis of obesity and her BMI today is 48.37 Rebekah [name] is in the action stage of change   ADVISE: Rebekah [name] was educated on the multiple health risks of obesity as well as the benefit of weight loss to improve her health. She was advised of the need for long term treatment and the importance of lifestyle modifications.  AGREE:  Multiple dietary modification options and treatment options were discussed and  Rebekah [name] agreed to the above obesity treatment plan.   Rebekah [name], am acting as transcriptionist for [employer], PA-C I, Rebekah [name] [facility], have reviewed this note and agree with its content.

## 2017-06-13 ENCOUNTER — Ambulatory Visit (INDEPENDENT_AMBULATORY_CARE_PROVIDER_SITE_OTHER): Payer: BC Managed Care – PPO | Admitting: Physician Assistant

## 2017-06-13 VITALS — BP 128/83 | HR 74 | Temp 98.5°F | Ht 61.0 in | Wt 254.0 lb

## 2017-06-13 DIAGNOSIS — Z6841 Body Mass Index (BMI) 40.0 and over, adult: Secondary | ICD-10-CM | POA: Diagnosis not present

## 2017-06-13 DIAGNOSIS — E559 Vitamin D deficiency, unspecified: Secondary | ICD-10-CM

## 2017-06-13 NOTE — Progress Notes (Signed)
Office: [phone]  /  Fax: [phone]   HPI:   Chief Complaint: OBESITY Rebekah [name] is here to discuss her progress with her obesity treatment plan. She is on the  keep a food journal with 500-600 calories and 40 grams of protein at supper daily and follow the Category 2 plan and is following her eating plan approximately 99 % of the time. She states she is walking for 20 minutes 7 times per week. Rebekah [name] continues to do well with weight loss. She states she does not like following a structured meal plan, and would only go back to keeping a food journal of her meals. States she continues to struggle to meet all her recommended protein.    Her weight is 254 lb (115.2 kg) today and has had a weight loss of 2 pounds over a period of 2 weeks since her last visit. She has lost 13 lbs since starting treatment with [name].  Vitamin D Deficiency Rebekah [name] has a diagnosis of vitamin D deficiency. She is currently taking prescription Vit D and denies nausea, vomiting or muscle weakness.  ALLERGIES: Allergies  Allergen Reactions  . Beef-Derived Products Anaphylaxis    After tick bite, cannot eat beef, pork or lamb  . Lambs Quarters Anaphylaxis    After tick bite cannot eat beef, pork or lamb  . Mucinex [Guaifenesin Er] Anaphylaxis  . Pork-Derived Products Anaphylaxis    After tick bite cannot eat beef, pork or lamb  . Darvon [Propoxyphene] Other (See Comments)    Hallucinations   . Ace Inhibitors Swelling and Cough    Pedal Edema  . Hydromet [Hydrocodone-Homatropine] Itching and Other (See Comments)    Severe stomach pain-face itches.   . Sulfonamide Derivatives Rash    MEDICATIONS: Current Outpatient Medications on File Prior to Visit  Medication Sig Dispense Refill  . acetaminophen (TYLENOL) 500 MG tablet Take 500-1,000 mg by mouth every 6 (six) hours as needed for moderate pain or headache.    Marland Kitchen CALCIUM PO Take 500 mg by mouth daily.     . cetirizine (ZYRTEC) 10 MG tablet Take 10 mg by mouth at  bedtime.     Marland Kitchen diltiazem (CARDIZEM) 30 MG tablet Take 1-2 tablets by mouth every 6 hours as needed for fast heart rates 30 tablet 1  . diltiazem (CARTIA XT) 120 MG 24 hr capsule Take 1 capsule (120 mg total) by mouth daily. 90 capsule 3  . diphenhydrAMINE (BENADRYL) 50 MG tablet Take 50 mg by mouth at bedtime as needed for itching.    Marland Kitchen EPINEPHrine (EPIPEN) 0.3 mg/0.3 mL IJ SOAJ injection Inject 0.3 mLs (0.3 mg total) into the muscle once as needed (anaphylaxis). 1 Device 6  . escitalopram (LEXAPRO) 10 MG tablet TAKE 1 TABLET BY MOUTH ONCE DAILY 90 tablet 3  . flecainide (TAMBOCOR) 100 MG tablet Take 3 tablets by mouth at onset of fast heart rates as needed 30 tablet 1  . furosemide (LASIX) 20 MG tablet Take 1 tablet (20 mg total) by mouth daily as needed for edema. 30 tablet 3  . levothyroxine (SYNTHROID, LEVOTHROID) 50 MCG tablet TAKE 1 TABLET BY MOUTH ONCE DAILY BEFORE BREAKFAST 90 tablet 2  . losartan (COZAAR) 50 MG tablet Take 1/2 tablet by mouth each morning and 1 tablet each evening 135 tablet 1  . Multiple Vitamins-Minerals (MULTIVITAMIN ADULT PO) Take 1 capsule by mouth 2 (two) times daily. Journey 3+3 Bariatric Multivitamin    . OXYGEN Inhale 3 L/min into the lungs at bedtime. Continuously    .  potassium chloride SA (K-DUR,KLOR-CON) 20 MEQ tablet Take 1 tablet (20 mEq total) by mouth daily as needed (take when taking lasix). 30 tablet 3  . rivaroxaban (XARELTO) 20 MG TABS tablet Take 1 tablet (20 mg total) by mouth daily with supper. 90 tablet 3  . traMADol (ULTRAM) 50 MG tablet Take 1 tablet (50 mg total) by mouth every 6 (six) hours as needed. 120 tablet 5  . Vitamin D, Ergocalciferol, (DRISDOL) 50000 units CAPS capsule Take 1 capsule (50,000 Units total) by mouth every 7 (seven) days. 4 capsule 0   No current facility-administered medications on file prior to visit.     PAST MEDICAL HISTORY: Past Medical History:  Diagnosis Date  . Anxiety   . Arthritis   . Bilateral bunions     . Complication of anesthesia   . Difficulty sleeping    prior sleep study did not reveal sleep apnea per patient  . DJD (degenerative joint disease)   . Dyspnea   . Dysrhythmia    a-fib  . Environmental allergies   . Food allergy   . Gallbladder problem   . GERD (gastroesophageal reflux disease)   . Heart murmur   . Hypertension    no meds after weight loss  . Hypothyroidism   . Incontinence of urine    at nite   . Joint pain   . Left atrial dilatation   . Left foot pain   . Leg edema   . Obesity    s/p gastric sleeve 12/2013 (previously weighed close to 400 lbs)  . Osteoarthritis   . Palpitations   . Paroxysmal atrial fibrillation (HCC)   . Pneumonia    hx of   . PONV (postoperative nausea and vomiting)   . Status post bilateral knee replacements   . Supplemental oxygen dependent    uses 2l/[place] at night, states HR goes low and O2 drops  . Tuberculosis    had 6 month of INH due to exposure   . Vaginal vault prolapse     PAST SURGICAL HISTORY: Past Surgical History:  Procedure Laterality Date  . APPENDECTOMY    . CHOLECYSTECTOMY    . EYE SURGERY     03/2014 lens implant left lens   . FOOT ARTHRODESIS Left 03/15/2016   Procedure: TALONAVICULAR AND SUBTALAR ARTHRODESIS;  Surgeon: Wylene Simmer, MD;  Location: [facility];  Service: Orthopedics;  Laterality: Left;  Marland Kitchen GASTROC RECESSION EXTREMITY Left 03/15/2016   Procedure: LEFT GASTROC RECESSION;  Surgeon: Wylene Simmer, MD;  Location: [facility];  Service: Orthopedics;  Laterality: Left;  . JOINT REPLACEMENT  1999   L TOTAL KNEE  . LAPAROSCOPIC GASTRIC SLEEVE RESECTION N/A 01/04/2014   Procedure: LAPAROSCOPIC GASTRIC SLEEVE RESECTION;  Surgeon: Greer Pickerel, MD;  Location: [facility];  Service: General;  Laterality: N/A;  . TONSILLECTOMY    . TOTAL KNEE ARTHROPLASTY Right 05/17/2015   Procedure: RIGHT TOTAL KNEE ARTHROPLASTY;  Surgeon: Paralee Cancel, MD;  Location: [facility];  Service: Orthopedics;   Laterality: Right;  . TRANSTHORACIC ECHOCARDIOGRAM  11/2012   EF 60-65%, mod LVH & mod conc hypertrophy, grade 1 diastolic dysfunction; LA mildly dilated; RV systolic pressure increased; PA peak pressure 76mmHg  . TUBAL LIGATION    . UPPER GI ENDOSCOPY  01/04/2014   Procedure: UPPER GI ENDOSCOPY;  Surgeon: Greer Pickerel, MD;  Location: [facility];  Service: General;;    SOCIAL HISTORY: Social History   Tobacco Use  . Smoking status: Never Smoker  .  Smokeless tobacco: Never Used  Substance Use Topics  . Alcohol use: No  . Drug use: No    FAMILY HISTORY: Family History  Problem Relation Age of Onset  . Diabetes Father   . Hyperlipidemia Father   . Hypertension Father   . Heart disease Father   . Sudden death Father   . Anxiety disorder Father   . Obesity Father   . Uterine cancer Mother   . Cervical cancer Mother   . Breast cancer Mother   . Cancer Mother        cervical and brreast cancer  . Hypertension Mother   . Hyperlipidemia Mother   . Obesity Mother   . Diabetes Maternal Grandmother     ROS: Review of Systems  Constitutional: Positive for weight loss.  Gastrointestinal: Negative for nausea and vomiting.  Musculoskeletal:       Negative muscle weakness    PHYSICAL EXAM: Blood pressure 128/83, pulse 74, temperature 98.5 F (36.9 C), temperature source Oral, height 5\' 1"  (1.549 m), weight 254 lb (115.2 kg), SpO2 99 %. Body mass index is 47.99 kg/m. Physical Exam  Constitutional: She is oriented to person, place, and time. She appears well-developed and well-nourished.  Cardiovascular: Normal rate.  Pulmonary/Chest: Effort normal.  Musculoskeletal: Normal range of motion.  Neurological: She is oriented to person, place, and time.  Skin: Skin is warm and dry.  Psychiatric: She has a normal mood and affect. Her behavior is normal.  Vitals reviewed.   RECENT LABS AND TESTS: BMET    Component Value Date/Time   NA 146 (H) 05/02/2017 0850   NA 143 05/02/2017  0850   K 5.0 05/02/2017 0850   K 5.0 05/02/2017 0850   CL 102 05/02/2017 0850   CL 102 05/02/2017 0850   CO2 24 05/02/2017 0850   CO2 24 05/02/2017 0850   GLUCOSE 89 05/02/2017 0850   GLUCOSE 91 05/02/2017 0850   GLUCOSE 141 (H) 05/18/2015 0456   BUN 21 05/02/2017 0850   BUN 20 05/02/2017 0850   CREATININE 0.75 05/02/2017 0850   CREATININE 0.82 05/02/2017 0850   CALCIUM 10.1 05/02/2017 0850   CALCIUM 10.1 05/02/2017 0850   GFRNONAA 86 05/02/2017 0850   GFRNONAA 77 05/02/2017 0850   GFRAA 99 05/02/2017 0850   GFRAA 89 05/02/2017 0850   Lab Results  Component Value Date   HGBA1C 5.5 05/02/2017   HGBA1C 5.9 (H) 01/01/2017   HGBA1C 6.1 (H) 07/02/2013   HGBA1C (H) 09/24/2006    6.9 (NOTE)   The ADA recommends the following therapeutic goals for glycemic   control related to Hgb A1C measurement:   Goal of Therapy:   < 7.0% Hgb A1C   Action Suggested:  > 8.0% Hgb A1C   Ref:  Diabetes Care, 22, Suppl. 1, 1999   Lab Results  Component Value Date   INSULIN 7.9 05/02/2017   INSULIN 9.8 01/01/2017   CBC    Component Value Date/Time   WBC 7.6 01/01/2017 1335   WBC 8.0 05/18/2015 0456   RBC 4.91 01/01/2017 1335   RBC 3.29 (L) 05/18/2015 0456   HGB 13.6 01/01/2017 1335   HCT 42.6 01/01/2017 1335   PLT 117 (L) 05/18/2015 0456   MCV 87 01/01/2017 1335   MCH 27.7 01/01/2017 1335   MCH 29.2 05/18/2015 0456   MCHC 31.9 01/01/2017 1335   MCHC 32.3 05/18/2015 0456   RDW 14.7 01/01/2017 1335   LYMPHSABS 1.9 01/01/2017 1335   MONOABS 0.8 03/26/2015 0316  EOSABS 0.2 01/01/2017 1335   BASOSABS 0.1 01/01/2017 1335   Iron/TIBC/Ferritin/ %Sat No results found for: IRON, TIBC, FERRITIN, IRONPCTSAT Lipid Panel     Component Value Date/Time   CHOL 189 05/02/2017 0850   TRIG 103 05/02/2017 0850   HDL 67 05/02/2017 0850   CHOLHDL 4.2 07/02/2013 1301   VLDL 43 (H) 07/02/2013 1301   LDLCALC 101 (H) 05/02/2017 0850   Hepatic Function Panel     Component Value Date/Time   PROT 7.6  05/02/2017 0850   ALBUMIN 4.4 05/02/2017 0850   AST 55 (H) 05/02/2017 0850   ALT 42 (H) 05/02/2017 0850   ALKPHOS 81 05/02/2017 0850   BILITOT <0.2 05/02/2017 0850      Component Value Date/Time   TSH 3.090 05/02/2017 0850   TSH 2.350 01/01/2017 1335   TSH 1.523 07/20/2014 1546  Results for Wayson, MARYLEE BELZER (MRN [id number]) as of 06/13/2017 11:38  Ref. Range 05/02/2017 08:50  Vitamin D, 25-Hydroxy Latest Ref Range: 30.0 - 100.0 ng/mL 43.5    ASSESSMENT AND PLAN: Vitamin D deficiency  Class 3 severe obesity with serious comorbidity and body mass index (BMI) of 45.0 to 49.9 in adult, unspecified obesity type ([place])  PLAN:  Vitamin D Deficiency Rebekah [name] was informed that low vitamin D levels contributes to fatigue and are associated with obesity, breast, and colon cancer. Rebekah [name] agrees to continue taking prescription Vit D @50 ,000 IU every week #4 and will follow up for routine testing of vitamin D, at least 2-3 times per year. She was informed of the risk of over-replacement of vitamin D and agrees to not increase her dose unless she discusses this with [name] first. Rebekah [name] agrees to follow up with our clinic in 2 weeks.  We spent > than 50% of the 15 minute visit on the counseling as documented in the note.  Obesity Rebekah [name] is currently in the action stage of change. As such, her goal is to continue with weight loss efforts She has agreed to change to keep a food journal with 1200 calories and 80 grams of protein daily Rebekah [name] has been instructed to work up to a goal of 150 minutes of combined cardio and strengthening exercise per week for weight loss and overall health benefits. We discussed the following Behavioral Modification Strategies today: increasing lean protein intake and keep a strict food journal   Rebekah [name] has agreed to follow up with our clinic in 2 weeks. She was informed of the importance of frequent follow up visits to maximize her success with intensive lifestyle modifications for her  multiple health conditions.   OBESITY BEHAVIORAL INTERVENTION VISIT  Today's visit was # 9 out of 22.  Starting weight: 267 lbs Starting date: 01/01/17 Today's weight : 254 lbs  Today's date: 06/13/2017 Total lbs lost to date: 13 (Patients must lose 7 lbs in the first 6 months to continue with counseling)   ASK: We discussed the diagnosis of obesity with Rebekah [name] today and Rebekah [name] agreed to give [name] permission to discuss obesity behavioral modification therapy today.  ASSESS: Rebekah [name] has the diagnosis of obesity and her BMI today is 48.02 Rebekah [name] is in the action stage of change   ADVISE: Rebekah [name] was educated on the multiple health risks of obesity as well as the benefit of weight loss to improve her health. She was advised of the need for long term treatment and the importance of lifestyle modifications.  AGREE: Multiple dietary modification options and treatment options were discussed and  Rebekah [name]  agreed to the above obesity treatment plan.   Rebekah [name], am acting as transcriptionist for Lacy Duverney, PA-C I, Lacy Duverney [facility], have reviewed this note and agree with its content.

## 2017-06-15 ENCOUNTER — Other Ambulatory Visit (INDEPENDENT_AMBULATORY_CARE_PROVIDER_SITE_OTHER): Payer: Self-pay | Admitting: Physician Assistant

## 2017-06-15 DIAGNOSIS — E559 Vitamin D deficiency, unspecified: Secondary | ICD-10-CM

## 2017-06-27 ENCOUNTER — Ambulatory Visit (INDEPENDENT_AMBULATORY_CARE_PROVIDER_SITE_OTHER): Payer: BC Managed Care – PPO | Admitting: Physician Assistant

## 2017-06-27 VITALS — BP 127/82 | HR 64 | Temp 98.0°F | Ht 61.0 in | Wt 256.0 lb

## 2017-06-27 DIAGNOSIS — E559 Vitamin D deficiency, unspecified: Secondary | ICD-10-CM

## 2017-06-27 DIAGNOSIS — Z9189 Other specified personal risk factors, not elsewhere classified: Secondary | ICD-10-CM | POA: Diagnosis not present

## 2017-06-27 DIAGNOSIS — R7303 Prediabetes: Secondary | ICD-10-CM | POA: Diagnosis not present

## 2017-06-27 DIAGNOSIS — Z6841 Body Mass Index (BMI) 40.0 and over, adult: Secondary | ICD-10-CM

## 2017-06-27 MED ORDER — VITAMIN D (ERGOCALCIFEROL) 1.25 MG (50000 UNIT) PO CAPS: 50000.0000 [IU] | ORAL_CAPSULE | ORAL | 0 refills | Status: DC

## 2017-06-27 NOTE — Progress Notes (Signed)
Office: [phone]  /  Fax: [phone]   HPI:   Chief Complaint: OBESITY Rebekah [name] is here to discuss her progress with her obesity treatment plan. She is on the  keep a food journal with 1200 calories and 80 grams of protein daily and is following her eating plan approximately 50 to 60 % of the time. She states she is exercising 0 minutes 0 times per week. Rebekah [name] had increased celebration eating and is not journaling her meals. She states she is motivated to get back on track and continue with weight loss. Her weight is 256 lb (116.1 kg) today and has had a weight gain of 2 pounds over a period of 2 weeks since her last visit. She has lost 11 lbs since starting treatment with [name].  Vitamin D deficiency Rebekah [name] has a diagnosis of vitamin D deficiency. She is currently taking vit D and denies nausea, vomiting or muscle weakness.   Ref. Range 05/02/2017 08:50  Vitamin D, 25-Hydroxy Latest Ref Range: 30.0 - 100.0 ng/mL 43.5   Pre-Diabetes Rebekah [name] has a diagnosis of pre-diabetes based on her elevated Hgb A1c and was informed this puts her at greater risk of developing diabetes. She is not taking metformin currently and continues to work on diet and exercise to decrease risk of diabetes. She denies nausea, polyphagia or hypoglycemia.  At risk for diabetes Rebekah [name] is at higher than average risk for developing diabetes due to her obesity and pre-diabetes. She currently denies polyuria or polydipsia.  ALLERGIES: Allergies  Allergen Reactions  . Beef-Derived Products Anaphylaxis    After tick bite, cannot eat beef, pork or lamb  . Lambs Quarters Anaphylaxis    After tick bite cannot eat beef, pork or lamb  . Mucinex [Guaifenesin Er] Anaphylaxis  . Pork-Derived Products Anaphylaxis    After tick bite cannot eat beef, pork or lamb  . Darvon [Propoxyphene] Other (See Comments)    Hallucinations   . Ace Inhibitors Swelling and Cough    Pedal Edema  . Hydromet [Hydrocodone-Homatropine] Itching and Other  (See Comments)    Severe stomach pain-face itches.   . Sulfonamide Derivatives Rash    MEDICATIONS: Current Outpatient Medications on File Prior to Visit  Medication Sig Dispense Refill  . acetaminophen (TYLENOL) 500 MG tablet Take 500-1,000 mg by mouth every 6 (six) hours as needed for moderate pain or headache.    Marland Kitchen CALCIUM PO Take 500 mg by mouth daily.     . cetirizine (ZYRTEC) 10 MG tablet Take 10 mg by mouth at bedtime.     Marland Kitchen diltiazem (CARDIZEM) 30 MG tablet Take 1-2 tablets by mouth every 6 hours as needed for fast heart rates 30 tablet 1  . diltiazem (CARTIA XT) 120 MG 24 hr capsule Take 1 capsule (120 mg total) by mouth daily. 90 capsule 3  . diphenhydrAMINE (BENADRYL) 50 MG tablet Take 50 mg by mouth at bedtime as needed for itching.    Marland Kitchen EPINEPHrine (EPIPEN) 0.3 mg/0.3 mL IJ SOAJ injection Inject 0.3 mLs (0.3 mg total) into the muscle once as needed (anaphylaxis). 1 Device 6  . escitalopram (LEXAPRO) 10 MG tablet TAKE 1 TABLET BY MOUTH ONCE DAILY 90 tablet 3  . flecainide (TAMBOCOR) 100 MG tablet Take 3 tablets by mouth at onset of fast heart rates as needed 30 tablet 1  . furosemide (LASIX) 20 MG tablet Take 1 tablet (20 mg total) by mouth daily as needed for edema. 30 tablet 3  . levothyroxine (SYNTHROID, LEVOTHROID) 50 MCG  tablet TAKE 1 TABLET BY MOUTH ONCE DAILY BEFORE BREAKFAST 90 tablet 2  . losartan (COZAAR) 50 MG tablet Take 1/2 tablet by mouth each morning and 1 tablet each evening 135 tablet 1  . Multiple Vitamins-Minerals (MULTIVITAMIN ADULT PO) Take 1 capsule by mouth 2 (two) times daily. Journey 3+3 Bariatric Multivitamin    . OXYGEN Inhale 3 L/min into the lungs at bedtime. Continuously    . potassium chloride SA (K-DUR,KLOR-CON) 20 MEQ tablet Take 1 tablet (20 mEq total) by mouth daily as needed (take when taking lasix). 30 tablet 3  . rivaroxaban (XARELTO) 20 MG TABS tablet Take 1 tablet (20 mg total) by mouth daily with supper. 90 tablet 3  . traMADol (ULTRAM) 50  MG tablet Take 1 tablet (50 mg total) by mouth every 6 (six) hours as needed. 120 tablet 5   No current facility-administered medications on file prior to visit.     PAST MEDICAL HISTORY: Past Medical History:  Diagnosis Date  . Anxiety   . Arthritis   . Bilateral bunions   . Complication of anesthesia   . Difficulty sleeping    prior sleep study did not reveal sleep apnea per patient  . DJD (degenerative joint disease)   . Dyspnea   . Dysrhythmia    a-fib  . Environmental allergies   . Food allergy   . Gallbladder problem   . GERD (gastroesophageal reflux disease)   . Heart murmur   . Hypertension    no meds after weight loss  . Hypothyroidism   . Incontinence of urine    at nite   . Joint pain   . Left atrial dilatation   . Left foot pain   . Leg edema   . Obesity    s/p gastric sleeve 12/2013 (previously weighed close to 400 lbs)  . Osteoarthritis   . Palpitations   . Paroxysmal atrial fibrillation (HCC)   . Pneumonia    hx of   . PONV (postoperative nausea and vomiting)   . Status post bilateral knee replacements   . Supplemental oxygen dependent    uses 2l/[place] at night, states HR goes low and O2 drops  . Tuberculosis    had 6 month of INH due to exposure   . Vaginal vault prolapse     PAST SURGICAL HISTORY: Past Surgical History:  Procedure Laterality Date  . APPENDECTOMY    . CHOLECYSTECTOMY    . EYE SURGERY     03/2014 lens implant left lens   . FOOT ARTHRODESIS Left 03/15/2016   Procedure: TALONAVICULAR AND SUBTALAR ARTHRODESIS;  Surgeon: Wylene Simmer, MD;  Location: [facility];  Service: Orthopedics;  Laterality: Left;  Marland Kitchen GASTROC RECESSION EXTREMITY Left 03/15/2016   Procedure: LEFT GASTROC RECESSION;  Surgeon: Wylene Simmer, MD;  Location: [facility];  Service: Orthopedics;  Laterality: Left;  . JOINT REPLACEMENT  1999   L TOTAL KNEE  . LAPAROSCOPIC GASTRIC SLEEVE RESECTION N/A 01/04/2014   Procedure: LAPAROSCOPIC GASTRIC  SLEEVE RESECTION;  Surgeon: Greer Pickerel, MD;  Location: [facility];  Service: General;  Laterality: N/A;  . TONSILLECTOMY    . TOTAL KNEE ARTHROPLASTY Right 05/17/2015   Procedure: RIGHT TOTAL KNEE ARTHROPLASTY;  Surgeon: Paralee Cancel, MD;  Location: [facility];  Service: Orthopedics;  Laterality: Right;  . TRANSTHORACIC ECHOCARDIOGRAM  11/2012   EF 60-65%, mod LVH & mod conc hypertrophy, grade 1 diastolic dysfunction; LA mildly dilated; RV systolic pressure increased; PA peak pressure 67mmHg  .  TUBAL LIGATION    . UPPER GI ENDOSCOPY  01/04/2014   Procedure: UPPER GI ENDOSCOPY;  Surgeon: Greer Pickerel, MD;  Location: [facility];  Service: General;;    SOCIAL HISTORY: Social History   Tobacco Use  . Smoking status: Never Smoker  . Smokeless tobacco: Never Used  Substance Use Topics  . Alcohol use: No  . Drug use: No    FAMILY HISTORY: Family History  Problem Relation Age of Onset  . Diabetes Father   . Hyperlipidemia Father   . Hypertension Father   . Heart disease Father   . Sudden death Father   . Anxiety disorder Father   . Obesity Father   . Uterine cancer Mother   . Cervical cancer Mother   . Breast cancer Mother   . Cancer Mother        cervical and brreast cancer  . Hypertension Mother   . Hyperlipidemia Mother   . Obesity Mother   . Diabetes Maternal Grandmother     ROS: Review of Systems  Constitutional: Negative for weight loss.  Gastrointestinal: Negative for nausea and vomiting.  Genitourinary: Negative for frequency.  Musculoskeletal:       Negative muscle weakness  Endo/Heme/Allergies: Negative for polydipsia.       Negative for polyphagia Negative for hypoglycemia    PHYSICAL EXAM: Blood pressure 127/82, pulse 64, temperature 98 F (36.7 C), temperature source Oral, height 5\' 1"  (1.549 m), weight 256 lb (116.1 kg), SpO2 95 %. Body mass index is 48.37 kg/m. Physical Exam  Constitutional: She is oriented to person, place, and time. She appears well-developed  and well-nourished.  Cardiovascular: Normal rate.  Pulmonary/Chest: Effort normal.  Musculoskeletal: Normal range of motion.  Neurological: She is oriented to person, place, and time.  Skin: Skin is warm and dry.  Psychiatric: She has a normal mood and affect. Her behavior is normal.  Vitals reviewed.   RECENT LABS AND TESTS: BMET    Component Value Date/Time   NA 146 (H) 05/02/2017 0850   NA 143 05/02/2017 0850   K 5.0 05/02/2017 0850   K 5.0 05/02/2017 0850   CL 102 05/02/2017 0850   CL 102 05/02/2017 0850   CO2 24 05/02/2017 0850   CO2 24 05/02/2017 0850   GLUCOSE 89 05/02/2017 0850   GLUCOSE 91 05/02/2017 0850   GLUCOSE 141 (H) 05/18/2015 0456   BUN 21 05/02/2017 0850   BUN 20 05/02/2017 0850   CREATININE 0.75 05/02/2017 0850   CREATININE 0.82 05/02/2017 0850   CALCIUM 10.1 05/02/2017 0850   CALCIUM 10.1 05/02/2017 0850   GFRNONAA 86 05/02/2017 0850   GFRNONAA 77 05/02/2017 0850   GFRAA 99 05/02/2017 0850   GFRAA 89 05/02/2017 0850   Lab Results  Component Value Date   HGBA1C 5.5 05/02/2017   HGBA1C 5.9 (H) 01/01/2017   HGBA1C 6.1 (H) 07/02/2013   HGBA1C (H) 09/24/2006    6.9 (NOTE)   The ADA recommends the following therapeutic goals for glycemic   control related to Hgb A1C measurement:   Goal of Therapy:   < 7.0% Hgb A1C   Action Suggested:  > 8.0% Hgb A1C   Ref:  Diabetes Care, 22, Suppl. 1, 1999   Lab Results  Component Value Date   INSULIN 7.9 05/02/2017   INSULIN 9.8 01/01/2017   CBC    Component Value Date/Time   WBC 7.6 01/01/2017 1335   WBC 8.0 05/18/2015 0456   RBC 4.91 01/01/2017 1335   RBC 3.29 (  L) 05/18/2015 0456   HGB 13.6 01/01/2017 1335   HCT 42.6 01/01/2017 1335   PLT 117 (L) 05/18/2015 0456   MCV 87 01/01/2017 1335   MCH 27.7 01/01/2017 1335   MCH 29.2 05/18/2015 0456   MCHC 31.9 01/01/2017 1335   MCHC 32.3 05/18/2015 0456   RDW 14.7 01/01/2017 1335   LYMPHSABS 1.9 01/01/2017 1335   MONOABS 0.8 03/26/2015 0316   EOSABS 0.2  01/01/2017 1335   BASOSABS 0.1 01/01/2017 1335   Iron/TIBC/Ferritin/ %Sat No results found for: IRON, TIBC, FERRITIN, IRONPCTSAT Lipid Panel     Component Value Date/Time   CHOL 189 05/02/2017 0850   TRIG 103 05/02/2017 0850   HDL 67 05/02/2017 0850   CHOLHDL 4.2 07/02/2013 1301   VLDL 43 (H) 07/02/2013 1301   LDLCALC 101 (H) 05/02/2017 0850   Hepatic Function Panel     Component Value Date/Time   PROT 7.6 05/02/2017 0850   ALBUMIN 4.4 05/02/2017 0850   AST 55 (H) 05/02/2017 0850   ALT 42 (H) 05/02/2017 0850   ALKPHOS 81 05/02/2017 0850   BILITOT <0.2 05/02/2017 0850      Component Value Date/Time   TSH 3.090 05/02/2017 0850   TSH 2.350 01/01/2017 1335   TSH 1.523 07/20/2014 1546    Ref. Range 05/02/2017 08:50  Vitamin D, 25-Hydroxy Latest Ref Range: 30.0 - 100.0 ng/mL 43.5   ASSESSMENT AND PLAN: Vitamin D deficiency - Plan: Vitamin D, Ergocalciferol, (DRISDOL) 50000 units CAPS capsule  Prediabetes  At risk for diabetes mellitus  Class 3 severe obesity with serious comorbidity and body mass index (BMI) of 45.0 to 49.9 in adult, unspecified obesity type ([place])  PLAN:  Vitamin D Deficiency Andera was informed that low vitamin D levels contributes to fatigue and are associated with obesity, breast, and colon cancer. She agrees to continue to take prescription Vit D @50 ,000 IU every week #4 with no refills and will follow up for routine testing of vitamin D, at least 2-3 times per year. She was informed of the risk of over-replacement of vitamin D and agrees to not increase her dose unless she discusses this with [name] first. Juanelle agrees to follow up with our clinic in 2 weeks.  Pre-Diabetes Yumalay will continue to work on weight loss, exercise, and decreasing simple carbohydrates in her diet to help decrease the risk of diabetes. We dicussed metformin including benefits and risks. She was informed that eating too many simple carbohydrates or too many calories at one sitting  increases the likelihood of GI side effects. Meliyah declined metformin for now and a prescription was not written today. Lashun agreed to follow up with [name] as directed to monitor her progress.  Diabetes risk counseling Amariana was given extended (15 minutes) diabetes prevention counseling today. She is 62 y.o. female and has risk factors for diabetes including obesity and pre-diabetes. We discussed intensive lifestyle modifications today with an emphasis on weight loss as well as increasing exercise and decreasing simple carbohydrates in her diet.  Obesity Tawnie is currently in the action stage of change. As such, her goal is to continue with weight loss efforts She has agreed to keep a food journal with 1200 calories and 80 grams of protein daily Cipriana has been instructed to work up to a goal of 150 minutes of combined cardio and strengthening exercise per week for weight loss and overall health benefits. We discussed the following Behavioral Modification Strategies today: increasing lean protein intake  And keep a strict food  journal  Shaeley has agreed to follow up with our clinic in 2 weeks. She was informed of the importance of frequent follow up visits to maximize her success with intensive lifestyle modifications for her multiple health conditions.   OBESITY BEHAVIORAL INTERVENTION VISIT  Today's visit was # 10 out of 22.  Starting weight: 267 lbs Starting date: 01/01/17 Today's weight : 256 lbs  Today's date: 06/27/2017 Total lbs lost to date: 11 (Patients must lose 7 lbs in the first 6 months to continue with counseling)   ASK: We discussed the diagnosis of obesity with [name] today and Rosalie agreed to give [name] permission to discuss obesity behavioral modification therapy today.  ASSESS: Marionna has the diagnosis of obesity and her BMI today is 82.4 Aylanie is in the action stage of change   ADVISE: Christna was educated on the multiple health risks of obesity as well as the benefit of weight  loss to improve her health. She was advised of the need for long term treatment and the importance of lifestyle modifications.  AGREE: Multiple dietary modification options and treatment options were discussed and  Dajanee agreed to the above obesity treatment plan.   Corey Skains, am acting as transcriptionist for [employer], PA-C I, Lacy Duver[facility], have reviewed this note and agree with its content

## 2017-07-11 ENCOUNTER — Ambulatory Visit (INDEPENDENT_AMBULATORY_CARE_PROVIDER_SITE_OTHER): Payer: BC Managed Care – PPO | Admitting: Physician Assistant

## 2017-07-15 ENCOUNTER — Other Ambulatory Visit: Payer: Self-pay | Admitting: Physician Assistant

## 2017-07-15 ENCOUNTER — Encounter: Payer: Self-pay | Admitting: Physician Assistant

## 2017-07-15 MED ORDER — AMOXICILLIN 500 MG PO CAPS: 1000.0000 mg | ORAL_CAPSULE | Freq: Two times a day (BID) | ORAL | 0 refills | Status: DC

## 2017-07-17 ENCOUNTER — Ambulatory Visit: Payer: BC Managed Care – PPO | Admitting: Physician Assistant

## 2017-07-17 ENCOUNTER — Encounter: Payer: Self-pay | Admitting: Physician Assistant

## 2017-07-17 VITALS — BP 134/94 | HR 79 | Temp 100.2°F | Ht 61.0 in | Wt 256.0 lb

## 2017-07-17 DIAGNOSIS — H6690 Otitis media, unspecified, unspecified ear: Secondary | ICD-10-CM

## 2017-07-17 DIAGNOSIS — B001 Herpesviral vesicular dermatitis: Secondary | ICD-10-CM | POA: Diagnosis not present

## 2017-07-17 MED ORDER — VALACYCLOVIR HCL 1 G PO TABS: 1000.0000 mg | ORAL_TABLET | Freq: Two times a day (BID) | ORAL | 6 refills | Status: DC

## 2017-07-17 MED ORDER — TRAMADOL HCL 50 MG PO TABS: 50.0000 mg | ORAL_TABLET | Freq: Four times a day (QID) | ORAL | 5 refills | Status: DC | PRN

## 2017-07-17 MED ORDER — PREDNISONE 10 MG (21) PO TBPK
ORAL_TABLET | ORAL | 0 refills | Status: DC
Start: 2017-07-17 — End: 2017-10-03

## 2017-07-17 NOTE — Progress Notes (Signed)
BP (!) 134/94   Pulse 79   Temp 100.2 F (37.9 C) (Oral)   Ht 5\' 1"  (1.549 m)   Wt 256 lb (116.1 kg)   BMI 48.37 kg/m    Subjective:    Patient ID: Rebekah [name], female    DOB: 06/26/1955, 62 y.o.   MRN: [id number]  HPI: Rebekah [name] is a 62 y.o. female presenting on 07/17/2017 for Ear Pain (left ) and Fever blister (? )    Past Medical History:  Diagnosis Date  . Anxiety   . Arthritis   . Bilateral bunions   . Complication of anesthesia   . Difficulty sleeping    prior sleep study did not reveal sleep apnea per patient  . DJD (degenerative joint disease)   . Dyspnea   . Dysrhythmia    a-fib  . Environmental allergies   . Food allergy   . Gallbladder problem   . GERD (gastroesophageal reflux disease)   . Heart murmur   . Hypertension    no meds after weight loss  . Hypothyroidism   . Incontinence of urine    at nite   . Joint pain   . Left atrial dilatation   . Left foot pain   . Leg edema   . Obesity    s/p gastric sleeve 12/2013 (previously weighed close to 400 lbs)  . Osteoarthritis   . Palpitations   . Paroxysmal atrial fibrillation (HCC)   . Pneumonia    hx of   . PONV (postoperative nausea and vomiting)   . Status post bilateral knee replacements   . Supplemental oxygen dependent    uses 2l/[place] at night, states HR goes low and O2 drops  . Tuberculosis    had 6 month of INH due to exposure   . Vaginal vault prolapse    Relevant past medical, surgical, family and social history reviewed and updated as indicated. Interim medical history since our last visit reviewed. Allergies and medications reviewed and updated. DATA REVIEWED: CHART IN EPIC  Family History reviewed for pertinent findings.  Review of Systems  Constitutional: Negative for activity change, appetite change, chills and fatigue.  HENT: Positive for congestion, ear pain, postnasal drip and sore throat.   Eyes: Negative.   Respiratory: Positive for cough. Negative for wheezing.     Cardiovascular: Negative.  Negative for chest pain, palpitations and leg swelling.  Gastrointestinal: Negative.   Genitourinary: Negative.   Musculoskeletal: Negative.   Skin: Negative.   Neurological: Positive for headaches.    Allergies as of 07/17/2017      Reactions   Beef-derived Products Anaphylaxis   After tick bite, cannot eat beef, pork or lamb   Lambs Quarters Anaphylaxis   After tick bite cannot eat beef, pork or lamb   Mucinex [guaifenesin Er] Anaphylaxis   Pork-derived Products Anaphylaxis   After tick bite cannot eat beef, pork or lamb   Darvon [propoxyphene] Other (See Comments)   Hallucinations   Ace Inhibitors Swelling, Cough   Pedal Edema   Hydromet [hydrocodone-homatropine] Itching, Other (See Comments)   Severe stomach pain-face itches.    Sulfonamide Derivatives Rash      Medication List        Accurate as of 07/17/17  1:45 PM. Always use your most recent med list.          acetaminophen 500 MG tablet Commonly known as:  TYLENOL Take 500-1,000 mg by mouth every 6 (six) hours as needed for moderate pain  or headache.   amoxicillin 500 MG capsule Commonly known as:  AMOXIL Take 2 capsules (1,000 mg total) by mouth 2 (two) times daily.   CALCIUM PO Take 500 mg by mouth daily.   cetirizine 10 MG tablet Commonly known as:  ZYRTEC Take 10 mg by mouth at bedtime.   diltiazem 120 MG 24 hr capsule Commonly known as:  CARTIA XT Take 1 capsule (120 mg total) by mouth daily.   diltiazem 30 MG tablet Commonly known as:  CARDIZEM Take 1-2 tablets by mouth every 6 hours as needed for fast heart rates   diphenhydrAMINE 50 MG tablet Commonly known as:  BENADRYL Take 50 mg by mouth at bedtime as needed for itching.   EPINEPHrine 0.3 mg/0.3 mL Soaj injection Commonly known as:  EPIPEN Inject 0.3 mLs (0.3 mg total) into the muscle once as needed (anaphylaxis).   escitalopram 10 MG tablet Commonly known as:  LEXAPRO TAKE 1 TABLET BY MOUTH ONCE DAILY    flecainide 100 MG tablet Commonly known as:  TAMBOCOR Take 3 tablets by mouth at onset of fast heart rates as needed   furosemide 20 MG tablet Commonly known as:  LASIX Take 1 tablet (20 mg total) by mouth daily as needed for edema.   levothyroxine 50 MCG tablet Commonly known as:  SYNTHROID, LEVOTHROID TAKE 1 TABLET BY MOUTH ONCE DAILY BEFORE BREAKFAST   losartan 50 MG tablet Commonly known as:  COZAAR Take 1/2 tablet by mouth each morning and 1 tablet each evening   MULTIVITAMIN ADULT PO Take 1 capsule by mouth 2 (two) times daily. Journey 3+3 Bariatric Multivitamin   OXYGEN Inhale 3 L/min into the lungs at bedtime. Continuously   potassium chloride SA 20 MEQ tablet Commonly known as:  K-DUR,KLOR-CON Take 1 tablet (20 mEq total) by mouth daily as needed (take when taking lasix).   predniSONE 10 MG (21) Tbpk tablet Commonly known as:  STERAPRED UNI-PAK 21 TAB As directed x 6 days   rivaroxaban 20 MG Tabs tablet Commonly known as:  XARELTO Take 1 tablet (20 mg total) by mouth daily with supper.   traMADol 50 MG tablet Commonly known as:  ULTRAM Take 1 tablet (50 mg total) by mouth every 6 (six) hours as needed.   valACYclovir 1000 MG tablet Commonly known as:  VALTREX Take 1 tablet (1,000 mg total) by mouth 2 (two) times daily.   Vitamin D (Ergocalciferol) 50000 units Caps capsule Commonly known as:  DRISDOL Take 1 capsule (50,000 Units total) by mouth every 7 (seven) days.          Objective:    BP (!) 134/94   Pulse 79   Temp 100.2 F (37.9 C) (Oral)   Ht 5\' 1"  (1.549 m)   Wt 256 lb (116.1 kg)   BMI 48.37 kg/m   Allergies  Allergen Reactions  . Beef-Derived Products Anaphylaxis    After tick bite, cannot eat beef, pork or lamb  . Lambs Quarters Anaphylaxis    After tick bite cannot eat beef, pork or lamb  . Mucinex [Guaifenesin Er] Anaphylaxis  . Pork-Derived Products Anaphylaxis    After tick bite cannot eat beef, pork or lamb  . Darvon  [Propoxyphene] Other (See Comments)    Hallucinations   . Ace Inhibitors Swelling and Cough    Pedal Edema  . Hydromet [Hydrocodone-Homatropine] Itching and Other (See Comments)    Severe stomach pain-face itches.   . Sulfonamide Derivatives Rash    Wt Readings from Last  3 Encounters:  07/17/17 256 lb (116.1 kg)  06/27/17 256 lb (116.1 kg)  06/13/17 254 lb (115.2 kg)    Physical Exam  Constitutional: She is oriented to person, place, and time. She appears well-developed and well-nourished.  HENT:  Head: Normocephalic and atraumatic.    Right Ear: Tympanic membrane normal. No drainage, swelling or tenderness.  Left Ear: There is tenderness. Tympanic membrane is erythematous and bulging.  Cluster of blisters on a mildly pink base, raised, on left chin below the lip  Eyes: Pupils are equal, round, and reactive to light. Conjunctivae and EOM are normal.  Cardiovascular: Normal rate, regular rhythm, normal heart sounds and intact distal pulses.  Pulmonary/Chest: Effort normal and breath sounds normal.  Abdominal: Soft. Bowel sounds are normal.  Neurological: She is alert and oriented to person, place, and time. She has normal reflexes.  Skin: Skin is warm and dry. No rash noted.  Psychiatric: She has a normal mood and affect. Her behavior is normal. Judgment and thought content normal.        Assessment & Plan:   1. Fever blister - valACYclovir (VALTREX) 1000 MG tablet; Take 1 tablet (1,000 mg total) by mouth 2 (two) times daily.  Dispense: 20 tablet; Refill: 6  2. Acute otitis media, unspecified otitis media type - predniSONE (STERAPRED UNI-PAK 21 TAB) 10 MG (21) TBPK tablet; As directed x 6 days  Dispense: 21 tablet; Refill: 0   Continue all other maintenance medications as listed above.  Follow up plan: No follow-ups on file.  Educational handout given for [place] PA-C [place] [address]  [place], [place]  [phone]   07/17/2017, 1:45 PM

## 2017-07-18 ENCOUNTER — Encounter: Payer: Self-pay | Admitting: Physician Assistant

## 2017-07-18 ENCOUNTER — Other Ambulatory Visit: Payer: Self-pay | Admitting: Physician Assistant

## 2017-07-18 MED ORDER — MAGIC MOUTHWASH: 10.0000 mL | Freq: Four times a day (QID) | ORAL | 0 refills | Status: DC

## 2017-07-18 MED ORDER — TRAMADOL HCL 50 MG PO TABS: 50.0000 mg | ORAL_TABLET | Freq: Four times a day (QID) | ORAL | 5 refills | Status: DC | PRN

## 2017-07-19 ENCOUNTER — Encounter (INDEPENDENT_AMBULATORY_CARE_PROVIDER_SITE_OTHER): Payer: Self-pay | Admitting: Family Medicine

## 2017-07-19 ENCOUNTER — Encounter: Payer: Self-pay | Admitting: Physician Assistant

## 2017-07-19 ENCOUNTER — Ambulatory Visit: Payer: BC Managed Care – PPO | Admitting: Physician Assistant

## 2017-07-19 ENCOUNTER — Telehealth: Payer: Self-pay | Admitting: Physician Assistant

## 2017-07-19 ENCOUNTER — Other Ambulatory Visit: Payer: Self-pay | Admitting: Physician Assistant

## 2017-07-19 MED ORDER — GABAPENTIN 100 MG PO CAPS: 100.0000 mg | ORAL_CAPSULE | Freq: Every day | ORAL | 3 refills | Status: DC

## 2017-07-23 ENCOUNTER — Ambulatory Visit (INDEPENDENT_AMBULATORY_CARE_PROVIDER_SITE_OTHER): Payer: BC Managed Care – PPO | Admitting: Physician Assistant

## 2017-07-23 ENCOUNTER — Other Ambulatory Visit: Payer: Self-pay | Admitting: Physician Assistant

## 2017-07-23 ENCOUNTER — Encounter: Payer: Self-pay | Admitting: Physician Assistant

## 2017-07-23 ENCOUNTER — Encounter (INDEPENDENT_AMBULATORY_CARE_PROVIDER_SITE_OTHER): Payer: Self-pay

## 2017-07-23 MED ORDER — ACETAMINOPHEN-CODEINE 300-30 MG PO TABS: 2.0000 | ORAL_TABLET | ORAL | 0 refills | Status: DC | PRN

## 2017-08-06 ENCOUNTER — Other Ambulatory Visit: Payer: Self-pay | Admitting: Internal Medicine

## 2017-08-08 ENCOUNTER — Encounter (HOSPITAL_COMMUNITY): Payer: Self-pay

## 2017-08-21 ENCOUNTER — Ambulatory Visit (INDEPENDENT_AMBULATORY_CARE_PROVIDER_SITE_OTHER): Payer: BC Managed Care – PPO | Admitting: Physician Assistant

## 2017-08-21 VITALS — BP 140/87 | HR 82 | Temp 98.4°F | Ht 61.0 in | Wt 261.0 lb

## 2017-08-21 DIAGNOSIS — Z6841 Body Mass Index (BMI) 40.0 and over, adult: Secondary | ICD-10-CM | POA: Diagnosis not present

## 2017-08-21 DIAGNOSIS — I10 Essential (primary) hypertension: Secondary | ICD-10-CM

## 2017-08-21 DIAGNOSIS — E66813 Obesity, class 3: Secondary | ICD-10-CM

## 2017-08-21 DIAGNOSIS — E559 Vitamin D deficiency, unspecified: Secondary | ICD-10-CM | POA: Diagnosis not present

## 2017-08-21 DIAGNOSIS — Z9189 Other specified personal risk factors, not elsewhere classified: Secondary | ICD-10-CM

## 2017-08-21 MED ORDER — VITAMIN D (ERGOCALCIFEROL) 1.25 MG (50000 UNIT) PO CAPS: 50000.0000 [IU] | ORAL_CAPSULE | ORAL | 0 refills | Status: DC

## 2017-08-21 NOTE — Progress Notes (Signed)
Office: [phone]  /  Fax: [phone]   HPI:   Chief Complaint: OBESITY Rebekah Rebekah [name] is here to discuss her progress with her obesity treatment plan. She is on the keep a food journal with 1200 calories and 80 grams of protein daily and is following her eating plan approximately 75 % of the time. She states she is walking for 30 minutes 7 times per week. Rebekah Rebekah [name]'s last follow up with our clinic was on 06/27/17. She states she has been sick and has not been keeping up with her eating. skipping meals on occasions and not keeping a strict food journal.  Her weight is 261 lb (118.4 kg) today and has gained 5 pounds since her last visit. She has lost 6 lbs since starting treatment with [name].  Vitamin D Deficiency Rebekah Rebekah [name] has a diagnosis of vitamin D deficiency. She is currently taking prescription Vit D and denies nausea, vomiting or muscle weakness.  Hypertension Rebekah Rebekah [name] is a 62 y.o. female with hypertension. Rebekah Rebekah [name]'s blood pressure is elevated. She is on lisinopril and diltiazem. She continues to decline any adjustment to her medications. She denies chest pain or shortness of breath. She is working weight loss to help control her blood pressure with the goal of decreasing her risk of heart attack and stroke. Rebekah Rebekah [name] blood pressure is not currently controlled.  At risk for cardiovascular disease Rebekah Rebekah [name] is at a higher than average risk for cardiovascular disease due to obesity and hypertension. She currently denies any chest pain.  ALLERGIES: Allergies  Allergen Reactions  . Beef-Derived Products Anaphylaxis    After tick bite, cannot eat beef, pork or lamb  . Lambs Quarters Anaphylaxis    After tick bite cannot eat beef, pork or lamb  . Mucinex [Guaifenesin Er] Anaphylaxis  . Pork-Derived Products Anaphylaxis    After tick bite cannot eat beef, pork or lamb  . Darvon [Propoxyphene] Other (See Comments)    Hallucinations   . Ace Inhibitors Swelling and Cough    Pedal Edema  . Hydromet  [Hydrocodone-Homatropine] Itching and Other (See Comments)    Severe stomach pain-face itches.   . Sulfonamide Derivatives Rash    MEDICATIONS: Current Outpatient Medications on File Prior to Visit  Medication Sig Dispense Refill  . acetaminophen (TYLENOL) 500 MG tablet Take 500-1,000 mg by mouth every 6 (six) hours as needed for moderate pain or headache.    . Acetaminophen-Codeine (TYLENOL/CODEINE #3) 300-30 MG tablet Take 2 tablets by mouth every 4 (four) hours as needed for pain. 40 tablet Rebekah [name]  . amoxicillin (AMOXIL) 500 MG capsule Take 2 capsules (1,000 mg total) by mouth 2 (two) times daily. 40 capsule Rebekah [name]  . CALCIUM PO Take 500 mg by mouth daily.     . cetirizine (ZYRTEC) 10 MG tablet Take 10 mg by mouth at bedtime.     Rebekah Rebekah [name]  . diltiazem (CARTIA XT) 120 MG 24 hr capsule Take 1 capsule (120 mg total) by mouth daily. 90 capsule 3  . diphenhydrAMINE (BENADRYL) 50 MG tablet Take 50 mg by mouth at bedtime as needed for itching.    Rebekah Rebekah [name] EPINEPHrine (EPIPEN) Rebekah [name].3 mg/Rebekah [name].3 mL IJ SOAJ injection Inject Rebekah [name].3 mLs (Rebekah [name].3 mg total) into the muscle once as needed (anaphylaxis). 1 Device 6  . escitalopram (LEXAPRO) 10 MG tablet TAKE 1 TABLET BY MOUTH ONCE DAILY 90 tablet 3  .  flecainide (TAMBOCOR) 100 MG tablet TAKE 3 TABLETS BY MOUTH AT ONSET OF FAST HEART RATES AS NEEDED 270 tablet Rebekah [name]  . furosemide (LASIX) 20 MG tablet Take 1 tablet (20 mg total) by mouth daily as needed for edema. 30 tablet 3  . gabapentin (NEURONTIN) 100 MG capsule Take 1-3 capsules (100-300 mg total) by mouth at bedtime. 90 capsule 3  . levothyroxine (SYNTHROID, LEVOTHROID) 50 MCG tablet TAKE 1 TABLET BY MOUTH ONCE DAILY BEFORE BREAKFAST 90 tablet 2  . losartan (COZAAR) 50 MG tablet TAKE 1/2 (ONE-HALF) TABLET BY MOUTH IN THE MORNING AND 1 IN THE EVENING 135 tablet 1  . magic mouthwash SOLN Take 10 mLs by mouth 4 (four)  times daily. 240 mL Rebekah [name]  . Multiple Vitamins-Minerals (MULTIVITAMIN ADULT PO) Take 1 capsule by mouth 2 (two) times daily. Journey 3+3 Bariatric Multivitamin    . OXYGEN Inhale 3 L/min into the lungs at bedtime. Continuously    . potassium chloride SA (K-DUR,KLOR-CON) 20 MEQ tablet Take 1 tablet (20 mEq total) by mouth daily as needed (take when taking lasix). 30 tablet 3  . predniSONE (STERAPRED UNI-PAK 21 TAB) 10 MG (21) TBPK tablet As directed x 6 days 21 tablet Rebekah [name]  . rivaroxaban (XARELTO) 20 MG TABS tablet Take 1 tablet (20 mg total) by mouth daily with supper. 90 tablet 3  . traMADol (ULTRAM) 50 MG tablet Take 1 tablet (50 mg total) by mouth every 6 (six) hours as needed. 30 tablet 5  . valACYclovir (VALTREX) 1000 MG tablet Take 1 tablet (1,000 mg total) by mouth 2 (two) times daily. 20 tablet 6   No current facility-administered medications on file prior to visit.     PAST MEDICAL HISTORY: Past Medical History:  Diagnosis Date  . Anxiety   . Arthritis   . Bilateral bunions   . Complication of anesthesia   . Difficulty sleeping    prior sleep study did not reveal sleep apnea per patient  . DJD (degenerative joint disease)   . Dyspnea   . Dysrhythmia    a-fib  . Environmental allergies   . Food allergy   . Gallbladder problem   . GERD (gastroesophageal reflux disease)   . Heart murmur   . Hypertension    no meds after weight loss  . Hypothyroidism   . Incontinence of urine    at nite   . Joint pain   . Left atrial dilatation   . Left foot pain   . Leg edema   . Obesity    s/p gastric sleeve 12/2013 (previously weighed close to 400 lbs)  . Osteoarthritis   . Palpitations   . Paroxysmal atrial fibrillation (HCC)   . Pneumonia    hx of   . PONV (postoperative nausea and vomiting)   . Status post bilateral knee replacements   . Supplemental oxygen dependent    uses 2l/ at night, states HR goes low and O2 drops  . Tuberculosis    had 6 month of INH due to exposure   .  Vaginal vault prolapse     PAST SURGICAL HISTORY: Past Surgical History:  Procedure Laterality Date  . APPENDECTOMY    . CHOLECYSTECTOMY    . EYE SURGERY     03/2014 lens implant left lens   . FOOT ARTHRODESIS Left 03/15/2016   Procedure: TALONAVICULAR AND SUBTALAR ARTHRODESIS;  Surgeon: Wylene Simmer, MD;  Location: [facility];  Service: Orthopedics;  Laterality: Left;  Rebekah Rebekah [name] GASTROC RECESSION EXTREMITY Left  03/15/2016   Procedure: LEFT GASTROC RECESSION;  Surgeon: Wylene Simmer, MD;  Location: [facility];  Service: Orthopedics;  Laterality: Left;  . JOINT REPLACEMENT  1999   L TOTAL KNEE  . LAPAROSCOPIC GASTRIC SLEEVE RESECTION N/A 01/04/2014   Procedure: LAPAROSCOPIC GASTRIC SLEEVE RESECTION;  Surgeon: Greer Pickerel, MD;  Location: [facility];  Service: General;  Laterality: N/A;  . TONSILLECTOMY    . TOTAL KNEE ARTHROPLASTY Right 05/17/2015   Procedure: RIGHT TOTAL KNEE ARTHROPLASTY;  Surgeon: Paralee Cancel, MD;  Location: [facility];  Service: Orthopedics;  Laterality: Right;  . TRANSTHORACIC ECHOCARDIOGRAM  11/2012   EF 60-65%, mod LVH & mod conc hypertrophy, grade 1 diastolic dysfunction; LA mildly dilated; RV systolic pressure increased; PA peak pressure 70mmHg  . TUBAL LIGATION    . UPPER GI ENDOSCOPY  01/04/2014   Procedure: UPPER GI ENDOSCOPY;  Surgeon: Greer Pickerel, MD;  Location: [facility];  Service: General;;    SOCIAL HISTORY: Social History   Tobacco Use  . Smoking status: Never Smoker  . Smokeless tobacco: Never Used  Substance Use Topics  . Alcohol use: No  . Drug use: No    FAMILY HISTORY: Family History  Problem Relation Age of Onset  . Diabetes Father   . Hyperlipidemia Father   . Hypertension Father   . Heart disease Father   . Sudden death Father   . Anxiety disorder Father   . Obesity Father   . Uterine cancer Mother   . Cervical cancer Mother   . Breast cancer Mother   . Cancer Mother        cervical and brreast cancer  . Hypertension  Mother   . Hyperlipidemia Mother   . Obesity Mother   . Diabetes Maternal Grandmother     ROS: Review of Systems  Constitutional: Negative for weight loss.  Respiratory: Negative for shortness of breath.   Cardiovascular: Negative for chest pain.  Gastrointestinal: Negative for nausea and vomiting.  Musculoskeletal:       Negative muscle weakness    PHYSICAL EXAM: Blood pressure 140/87, pulse 82, temperature 98.4 F (36.9 C), temperature source Oral, height 5\' 1"  (1.549 m), weight 261 lb (118.4 kg), SpO2 95 %. Body mass index is 49.32 kg/m. Physical Exam  Constitutional: She is oriented to person, place, and time. She appears well-developed and well-nourished.  Cardiovascular: Normal rate.  Pulmonary/Chest: Effort normal.  Musculoskeletal: Normal range of motion.  Neurological: She is oriented to person, place, and time.  Skin: Skin is warm and dry.  Psychiatric: She has a normal mood and affect. Her behavior is normal.  Vitals reviewed.   RECENT LABS AND TESTS: BMET    Component Value Date/Time   NA 146 (H) 05/02/2017 0850   NA 143 05/02/2017 0850   K 5.Rebekah [name] 05/02/2017 0850   K 5.Rebekah [name] 05/02/2017 0850   CL 102 05/02/2017 0850   CL 102 05/02/2017 0850   CO2 24 05/02/2017 0850   CO2 24 05/02/2017 0850   GLUCOSE 89 05/02/2017 0850   GLUCOSE 91 05/02/2017 0850   GLUCOSE 141 (H) 05/18/2015 0456   BUN 21 05/02/2017 0850   BUN 20 05/02/2017 0850   CREATININE Rebekah [name].75 05/02/2017 0850   CREATININE Rebekah [name].82 05/02/2017 0850   CALCIUM 10.1 05/02/2017 0850   CALCIUM 10.1 05/02/2017 0850   GFRNONAA 86 05/02/2017 0850   GFRNONAA 77 05/02/2017 0850   GFRAA 99 05/02/2017 0850   GFRAA 89 05/02/2017 0850   Lab Results  Component Value Date  HGBA1C 5.5 05/02/2017   HGBA1C 5.9 (H) 01/01/2017   HGBA1C 6.1 (H) 07/02/2013   HGBA1C (H) 09/24/2006    6.9 (NOTE)   The ADA recommends the following therapeutic goals for glycemic   control related to Hgb A1C measurement:   Goal of Therapy:   <  7.Rebekah [name]% Hgb A1C   Action Suggested:  > 8.Rebekah [name]% Hgb A1C   Ref:  Diabetes Care, 22, Suppl. 1, 1999   Lab Results  Component Value Date   INSULIN 7.9 05/02/2017   INSULIN 9.8 01/01/2017   CBC    Component Value Date/Time   WBC 7.6 01/01/2017 1335   WBC 8.Rebekah [name] 05/18/2015 0456   RBC 4.91 01/01/2017 1335   RBC 3.29 (L) 05/18/2015 0456   HGB 13.6 01/01/2017 1335   HCT 42.6 01/01/2017 1335   PLT 117 (L) 05/18/2015 0456   MCV 87 01/01/2017 1335   MCH 27.7 01/01/2017 1335   MCH 29.2 05/18/2015 0456   MCHC 31.9 01/01/2017 1335   MCHC 32.3 05/18/2015 0456   RDW 14.7 01/01/2017 1335   LYMPHSABS 1.9 01/01/2017 1335   MONOABS Rebekah [name].8 03/26/2015 0316   EOSABS Rebekah [name].2 01/01/2017 1335   BASOSABS Rebekah [name].1 01/01/2017 1335   Iron/TIBC/Ferritin/ %Sat No results found for: IRON, TIBC, FERRITIN, IRONPCTSAT Lipid Panel     Component Value Date/Time   CHOL 189 05/02/2017 0850   TRIG 103 05/02/2017 0850   HDL 67 05/02/2017 0850   CHOLHDL 4.2 07/02/2013 1301   VLDL 43 (H) 07/02/2013 1301   LDLCALC 101 (H) 05/02/2017 0850   Hepatic Function Panel     Component Value Date/Time   PROT 7.6 05/02/2017 0850   ALBUMIN 4.4 05/02/2017 0850   AST 55 (H) 05/02/2017 0850   ALT 42 (H) 05/02/2017 0850   ALKPHOS 81 05/02/2017 0850   BILITOT <Rebekah [name].2 05/02/2017 0850      Component Value Date/Time   TSH 3.090 05/02/2017 0850   TSH 2.350 01/01/2017 1335   TSH 1.523 07/20/2014 1546  Results for Heiss, EULAR PANEK (MRN [id number]) as of 08/21/2017 14:14  Ref. Range 05/02/2017 08:50  Vitamin D, 25-Hydroxy Latest Ref Range: 30.Rebekah [name] - 100.Rebekah [name] ng/mL 43.5    ASSESSMENT AND PLAN: Vitamin D deficiency - Plan: Vitamin D, Ergocalciferol, (DRISDOL) 50000 units CAPS capsule  Essential hypertension  At risk for heart disease  Class 3 severe obesity with serious comorbidity and body mass index (BMI) of 45.Rebekah [name] to 49.9 in adult, unspecified obesity type ([place])  PLAN:  Vitamin D Deficiency Rebekah Rebekah [name] was informed that low vitamin D levels contributes to  fatigue and are associated with obesity, breast, and colon cancer. Rebekah Rebekah [name] agrees to continue taking prescription Vit D @50 ,000 IU every week #4 and we will refill for 1 month. She will follow up for routine testing of vitamin D, at least 2-3 times per year. She was informed of the risk of over-replacement of vitamin D and agrees to not increase her dose unless she discusses this with [name] first. Rebekah Rebekah [name] agrees to follow up with our clinic in 2 weeks.  Hypertension We discussed sodium restriction, working on healthy weight loss, and a regular exercise program as the means to achieve improved blood pressure control. Dayana agreed with this plan and agreed to follow up as directed. We will continue to monitor her blood pressure as well as her progress with the above lifestyle modifications. She will continue her medications as prescribed and will watch for signs of hypotension as she continues her lifestyle modifications. Marlisha agrees to follow up  with our clinic in 2 weeks.  Cardiovascular risk counselling Ermalinda was given extended (15 minutes) coronary artery disease prevention counseling today. She is 62 y.o. female and has risk factors for heart disease including obesity and hypertension. We discussed intensive lifestyle modifications today with an emphasis on specific weight loss instructions and strategies. Pt was also informed of the importance of increasing exercise and decreasing saturated fats to help prevent heart disease.  Obesity Shavona is currently in the action stage of change. As such, her goal is to get back to weightloss efforts  She has agreed to keep a food journal with 1200 calories and 80 grams of protein daily Devlynn has been instructed to work up to a goal of 150 minutes of combined cardio and strengthening exercise per week for weight loss and overall health benefits. We discussed the following Behavioral Modification Strategies today: increasing lean protein intake and keep a strict food  journal   Aymar has agreed to follow up with our clinic in 2 weeks. She was informed of the importance of frequent follow up visits to maximize her success with intensive lifestyle modifications for her multiple health conditions.   OBESITY BEHAVIORAL INTERVENTION VISIT  Today's visit was # 11 out of 22.  Starting weight: 267 lbs Starting date: 01/01/17 Today's weight : 261 lbs Today's date: 08/21/2017 Total lbs lost to date: 6 (Patients must lose 7 lbs in the first 6 months to continue with counseling)   ASK: We discussed the diagnosis of obesity with [name] today and Adelis agreed to give [name] permission to discuss obesity behavioral modification therapy today.  ASSESS: Letitia has the diagnosis of obesity and her BMI today is 49.34 Nathalia is in the action stage of change   ADVISE: Ardyn was educated on the multiple health risks of obesity as well as the benefit of weight loss to improve her health. She was advised of the need for long term treatment and the importance of lifestyle modifications.  AGREE: Multiple dietary modification options and treatment options were discussed and  Letisia agreed to the above obesity treatment plan.   Wilhemena Durie, am acting as transcriptionist for Lacy Duverney, PA-C I, Lacy Duverney [facility], have reviewed this note and agree with its content

## 2017-09-05 ENCOUNTER — Ambulatory Visit (INDEPENDENT_AMBULATORY_CARE_PROVIDER_SITE_OTHER): Payer: BC Managed Care – PPO | Admitting: Physician Assistant

## 2017-09-16 ENCOUNTER — Ambulatory Visit (INDEPENDENT_AMBULATORY_CARE_PROVIDER_SITE_OTHER): Payer: BC Managed Care – PPO | Admitting: Physician Assistant

## 2017-09-16 VITALS — BP 148/89 | HR 86 | Temp 97.8°F | Ht 61.0 in | Wt 264.0 lb

## 2017-09-16 DIAGNOSIS — Z9189 Other specified personal risk factors, not elsewhere classified: Secondary | ICD-10-CM | POA: Diagnosis not present

## 2017-09-16 DIAGNOSIS — E559 Vitamin D deficiency, unspecified: Secondary | ICD-10-CM | POA: Diagnosis not present

## 2017-09-16 DIAGNOSIS — Z6841 Body Mass Index (BMI) 40.0 and over, adult: Secondary | ICD-10-CM

## 2017-09-16 DIAGNOSIS — E66813 Obesity, class 3: Secondary | ICD-10-CM

## 2017-09-16 DIAGNOSIS — I10 Essential (primary) hypertension: Secondary | ICD-10-CM | POA: Diagnosis not present

## 2017-09-16 MED ORDER — VITAMIN D (ERGOCALCIFEROL) 1.25 MG (50000 UNIT) PO CAPS: 50000.0000 [IU] | ORAL_CAPSULE | ORAL | 0 refills | Status: DC

## 2017-09-16 NOTE — Progress Notes (Signed)
Office: [phone]  /  Fax: [phone]   HPI:   Chief Complaint: OBESITY Rebekah [name] is here to discuss her progress with her obesity treatment plan. She is on the  keep a food journal with 1200 calories and 80 grams of protein daily and is following her eating plan approximately 99 % of the time. She states she is exercising 0 minutes 0 times per week. Venecia keeps a strict food journal, but states, she eats less than her assigned calories and protein. Her weight is 264 lb (119.7 kg) today and has had a weight gain of 3 pounds over a period of 4 weeks since her last visit. She has lost 3 lbs since starting treatment with [name].  Vitamin D deficiency Rebekah [name] has a diagnosis of vitamin D deficiency. She is currently taking vit D and denies nausea, vomiting or muscle weakness.  Hypertension Rebekah [name] is a 62 y.o. female with hypertension. Her blood pressure is elevated today at 148/89 and is on lisinopril and diltiazem. She declines any medication adjustment and states she has white coat syndrome. Rebekah [name] denies chest pain or shortness of breath on exertion. She is working weight loss to help control her blood pressure with the goal of decreasing her risk of heart attack and stroke. Rebekah [name] blood pressure is not currently controlled.  At risk for cardiovascular disease Rebekah [name] is at a higher than average risk for cardiovascular disease due to obesity and hypertension. She currently denies any chest pain.  ALLERGIES: Allergies  Allergen Reactions  . Beef-Derived Products Anaphylaxis    After tick bite, cannot eat beef, pork or lamb  . Lambs Quarters Anaphylaxis    After tick bite cannot eat beef, pork or lamb  . Mucinex [Guaifenesin Er] Anaphylaxis  . Pork-Derived Products Anaphylaxis    After tick bite cannot eat beef, pork or lamb  . Darvon [Propoxyphene] Other (See Comments)    Hallucinations   . Ace Inhibitors Swelling and Cough    Pedal Edema  . Hydromet [Hydrocodone-Homatropine]  Itching and Other (See Comments)    Severe stomach pain-face itches.   . Sulfonamide Derivatives Rash    MEDICATIONS: Current Outpatient Medications on File Prior to Visit  Medication Sig Dispense Refill  . acetaminophen (TYLENOL) 500 MG tablet Take 500-1,000 mg by mouth every 6 (six) hours as needed for moderate pain or headache.    . Acetaminophen-Codeine (TYLENOL/CODEINE #3) 300-30 MG tablet Take 2 tablets by mouth every 4 (four) hours as needed for pain. 40 tablet 0  . amoxicillin (AMOXIL) 500 MG capsule Take 2 capsules (1,000 mg total) by mouth 2 (two) times daily. 40 capsule 0  . CALCIUM PO Take 500 mg by mouth daily.     . cetirizine (ZYRTEC) 10 MG tablet Take 10 mg by mouth at bedtime.     Marland Kitchen diltiazem (CARDIZEM) 30 MG tablet TAKE ONE TO TWO TABLETS BY MOUTH EVERY 6 HOURS AS NEEDED FOR  FAST  HEART  RATES 90 tablet 0  . diltiazem (CARTIA XT) 120 MG 24 hr capsule Take 1 capsule (120 mg total) by mouth daily. 90 capsule 3  . diphenhydrAMINE (BENADRYL) 50 MG tablet Take 50 mg by mouth at bedtime as needed for itching.    Marland Kitchen EPINEPHrine (EPIPEN) 0.3 mg/0.3 mL IJ SOAJ injection Inject 0.3 mLs (0.3 mg total) into the muscle once as needed (anaphylaxis). 1 Device 6  . escitalopram (LEXAPRO) 10 MG tablet TAKE 1 TABLET BY MOUTH ONCE DAILY 90 tablet 3  .  flecainide (TAMBOCOR) 100 MG tablet TAKE 3 TABLETS BY MOUTH AT ONSET OF FAST HEART RATES AS NEEDED 270 tablet 0  . furosemide (LASIX) 20 MG tablet Take 1 tablet (20 mg total) by mouth daily as needed for edema. 30 tablet 3  . gabapentin (NEURONTIN) 100 MG capsule Take 1-3 capsules (100-300 mg total) by mouth at bedtime. 90 capsule 3  . levothyroxine (SYNTHROID, LEVOTHROID) 50 MCG tablet TAKE 1 TABLET BY MOUTH ONCE DAILY BEFORE BREAKFAST 90 tablet 2  . losartan (COZAAR) 50 MG tablet TAKE 1/2 (ONE-HALF) TABLET BY MOUTH IN THE MORNING AND 1 IN THE EVENING 135 tablet 1  . magic mouthwash SOLN Take 10 mLs by mouth 4 (four) times daily. 240 mL 0  .  Multiple Vitamins-Minerals (MULTIVITAMIN ADULT PO) Take 1 capsule by mouth 2 (two) times daily. Journey 3+3 Bariatric Multivitamin    . OXYGEN Inhale 3 L/min into the lungs at bedtime. Continuously    . potassium chloride SA (K-DUR,KLOR-CON) 20 MEQ tablet Take 1 tablet (20 mEq total) by mouth daily as needed (take when taking lasix). 30 tablet 3  . predniSONE (STERAPRED UNI-PAK 21 TAB) 10 MG (21) TBPK tablet As directed x 6 days 21 tablet 0  . rivaroxaban (XARELTO) 20 MG TABS tablet Take 1 tablet (20 mg total) by mouth daily with supper. 90 tablet 3  . traMADol (ULTRAM) 50 MG tablet Take 1 tablet (50 mg total) by mouth every 6 (six) hours as needed. 30 tablet 5  . valACYclovir (VALTREX) 1000 MG tablet Take 1 tablet (1,000 mg total) by mouth 2 (two) times daily. 20 tablet 6  . Vitamin D, Ergocalciferol, (DRISDOL) 50000 units CAPS capsule Take 1 capsule (50,000 Units total) by mouth every 7 (seven) days. 4 capsule 0   No current facility-administered medications on file prior to visit.     PAST MEDICAL HISTORY: Past Medical History:  Diagnosis Date  . Anxiety   . Arthritis   . Bilateral bunions   . Complication of anesthesia   . Difficulty sleeping    prior sleep study did not reveal sleep apnea per patient  . DJD (degenerative joint disease)   . Dyspnea   . Dysrhythmia    a-fib  . Environmental allergies   . Food allergy   . Gallbladder problem   . GERD (gastroesophageal reflux disease)   . Heart murmur   . Hypertension    no meds after weight loss  . Hypothyroidism   . Incontinence of urine    at nite   . Joint pain   . Left atrial dilatation   . Left foot pain   . Leg edema   . Obesity    s/p gastric sleeve 12/2013 (previously weighed close to 400 lbs)  . Osteoarthritis   . Palpitations   . Paroxysmal atrial fibrillation (HCC)   . Pneumonia    hx of   . PONV (postoperative nausea and vomiting)   . Status post bilateral knee replacements   . Supplemental oxygen  dependent    uses 2l/[place] at night, states HR goes low and O2 drops  . Tuberculosis    had 6 month of INH due to exposure   . Vaginal vault prolapse     PAST SURGICAL HISTORY: Past Surgical History:  Procedure Laterality Date  . APPENDECTOMY    . CHOLECYSTECTOMY    . EYE SURGERY     03/2014 lens implant left lens   . FOOT ARTHRODESIS Left 03/15/2016   Procedure: TALONAVICULAR AND  SUBTALAR ARTHRODESIS;  Surgeon: Wylene Simmer, MD;  Location: [facility];  Service: Orthopedics;  Laterality: Left;  Marland Kitchen GASTROC RECESSION EXTREMITY Left 03/15/2016   Procedure: LEFT GASTROC RECESSION;  Surgeon: Wylene Simmer, MD;  Location: [facility];  Service: Orthopedics;  Laterality: Left;  . JOINT REPLACEMENT  1999   L TOTAL KNEE  . LAPAROSCOPIC GASTRIC SLEEVE RESECTION N/A 01/04/2014   Procedure: LAPAROSCOPIC GASTRIC SLEEVE RESECTION;  Surgeon: Greer Pickerel, MD;  Location: [facility];  Service: General;  Laterality: N/A;  . TONSILLECTOMY    . TOTAL KNEE ARTHROPLASTY Right 05/17/2015   Procedure: RIGHT TOTAL KNEE ARTHROPLASTY;  Surgeon: Paralee Cancel, MD;  Location: [facility];  Service: Orthopedics;  Laterality: Right;  . TRANSTHORACIC ECHOCARDIOGRAM  11/2012   EF 60-65%, mod LVH & mod conc hypertrophy, grade 1 diastolic dysfunction; LA mildly dilated; RV systolic pressure increased; PA peak pressure 39mmHg  . TUBAL LIGATION    . UPPER GI ENDOSCOPY  01/04/2014   Procedure: UPPER GI ENDOSCOPY;  Surgeon: Greer Pickerel, MD;  Location: [facility];  Service: General;;    SOCIAL HISTORY: Social History   Tobacco Use  . Smoking status: Never Smoker  . Smokeless tobacco: Never Used  Substance Use Topics  . Alcohol use: No  . Drug use: No    FAMILY HISTORY: Family History  Problem Relation Age of Onset  . Diabetes Father   . Hyperlipidemia Father   . Hypertension Father   . Heart disease Father   . Sudden death Father   . Anxiety disorder Father   . Obesity Father   . Uterine cancer  Mother   . Cervical cancer Mother   . Breast cancer Mother   . Cancer Mother        cervical and brreast cancer  . Hypertension Mother   . Hyperlipidemia Mother   . Obesity Mother   . Diabetes Maternal Grandmother     ROS: Review of Systems  Constitutional: Negative for malaise/fatigue.  Respiratory: Negative for shortness of breath (on exertion).   Cardiovascular: Negative for chest pain.  Gastrointestinal: Negative for nausea and vomiting.  Musculoskeletal:       Negative for muscle weakness    PHYSICAL EXAM: Blood pressure (!) 148/89, pulse 86, temperature 97.8 F (36.6 C), temperature source Oral, height 5\' 1"  (1.549 m), weight 264 lb (119.7 kg), SpO2 96 %. Body mass index is 49.88 kg/m. Physical Exam  Constitutional: She is oriented to person, place, and time. She appears well-developed and well-nourished.  Cardiovascular: Normal rate.  Pulmonary/Chest: Effort normal.  Musculoskeletal: Normal range of motion.  Neurological: She is oriented to person, place, and time.  Skin: Skin is warm and dry.  Psychiatric: She has a normal mood and affect. Her behavior is normal.  Vitals reviewed.   RECENT LABS AND TESTS: BMET    Component Value Date/Time   NA 146 (H) 05/02/2017 0850   NA 143 05/02/2017 0850   K 5.0 05/02/2017 0850   K 5.0 05/02/2017 0850   CL 102 05/02/2017 0850   CL 102 05/02/2017 0850   CO2 24 05/02/2017 0850   CO2 24 05/02/2017 0850   GLUCOSE 89 05/02/2017 0850   GLUCOSE 91 05/02/2017 0850   GLUCOSE 141 (H) 05/18/2015 0456   BUN 21 05/02/2017 0850   BUN 20 05/02/2017 0850   CREATININE 0.75 05/02/2017 0850   CREATININE 0.82 05/02/2017 0850   CALCIUM 10.1 05/02/2017 0850   CALCIUM 10.1 05/02/2017 0850   GFRNONAA 86 05/02/2017  [place] 05/02/2017 0850   GFRAA 99 05/02/2017 0850   GFRAA 89 05/02/2017 0850   Lab Results  Component Value Date   HGBA1C 5.5 05/02/2017   HGBA1C 5.9 (H) 01/01/2017   HGBA1C 6.1 (H) 07/02/2013   HGBA1C (H)  09/24/2006    6.9 (NOTE)   The ADA recommends the following therapeutic goals for glycemic   control related to Hgb A1C measurement:   Goal of Therapy:   < 7.0% Hgb A1C   Action Suggested:  > 8.0% Hgb A1C   Ref:  Diabetes Care, 22, Suppl. 1, 1999   Lab Results  Component Value Date   INSULIN 7.9 05/02/2017   INSULIN 9.8 01/01/2017   CBC    Component Value Date/Time   WBC 7.6 01/01/2017 1335   WBC 8.0 05/18/2015 0456   RBC 4.91 01/01/2017 1335   RBC 3.29 (L) 05/18/2015 0456   HGB 13.6 01/01/2017 1335   HCT 42.6 01/01/2017 1335   PLT 117 (L) 05/18/2015 0456   MCV 87 01/01/2017 1335   MCH 27.7 01/01/2017 1335   MCH 29.2 05/18/2015 0456   MCHC 31.9 01/01/2017 1335   MCHC 32.3 05/18/2015 0456   RDW 14.7 01/01/2017 1335   LYMPHSABS 1.9 01/01/2017 1335   MONOABS 0.8 03/26/2015 0316   EOSABS 0.2 01/01/2017 1335   BASOSABS 0.1 01/01/2017 1335   Iron/TIBC/Ferritin/ %Sat No results found for: IRON, TIBC, FERRITIN, IRONPCTSAT Lipid Panel     Component Value Date/Time   CHOL 189 05/02/2017 0850   TRIG 103 05/02/2017 0850   HDL 67 05/02/2017 0850   CHOLHDL 4.2 07/02/2013 1301   VLDL 43 (H) 07/02/2013 1301   LDLCALC 101 (H) 05/02/2017 0850   Hepatic Function Panel     Component Value Date/Time   PROT 7.6 05/02/2017 0850   ALBUMIN 4.4 05/02/2017 0850   AST 55 (H) 05/02/2017 0850   ALT 42 (H) 05/02/2017 0850   ALKPHOS 81 05/02/2017 0850   BILITOT <0.2 05/02/2017 0850      Component Value Date/Time   TSH 3.090 05/02/2017 0850   TSH 2.350 01/01/2017 1335   TSH 1.523 07/20/2014 1546   Results for Sarratt, YIANNA TERSIGNI (MRN [id number]) as of 09/16/2017 13:39  Ref. Range 05/02/2017 08:50  Vitamin D, 25-Hydroxy Latest Ref Range: 30.0 - 100.0 ng/mL 43.5   ASSESSMENT AND PLAN: Vitamin D deficiency - Plan: Vitamin D, Ergocalciferol, (DRISDOL) 50000 units CAPS capsule  Essential hypertension  At risk for heart disease  Class 3 severe obesity with serious comorbidity and body mass index  (BMI) of 50.0 to 59.9 in adult, unspecified obesity type ([place])  PLAN:  Vitamin D Deficiency Rebekah [name] was informed that low vitamin D levels contributes to fatigue and are associated with obesity, breast, and colon cancer. She agrees to continue to take prescription Vit D @50 ,000 IU every week #4 with no refills and will follow up for routine testing of vitamin D, at least 2-3 times per year. She was informed of the risk of over-replacement of vitamin D and agrees to not increase her dose unless she discusses this with [name] first. Rebekah [name] agrees to follow up as directed.  Hypertension We discussed sodium restriction, working on healthy weight loss, and a regular exercise program as the means to achieve improved blood pressure control. Rebekah [name] agreed with this plan and agreed to follow up as directed. We will continue to monitor her blood pressure as well as her progress with the above lifestyle modifications. She will continue  her medications as prescribed and will watch for signs of hypotension as she continues her lifestyle modifications.  Cardiovascular risk counseling Rebekah [name] was given extended (15 minutes) coronary artery disease prevention counseling today. She is 62 y.o. female and has risk factors for heart disease including obesity and hypertension. We discussed intensive lifestyle modifications today with an emphasis on specific weight loss instructions and strategies. Pt was also informed of the importance of increasing exercise and decreasing saturated fats to help prevent heart disease.  Obesity Rebekah [name] is currently in the action stage of change. As such, her goal is to continue with weight loss efforts She has agreed to keep a food journal with 1200 calories and 85 grams of protein daily Rebekah [name] has been instructed to work up to a goal of 150 minutes of combined cardio and strengthening exercise per week for weight loss and overall health benefits. We discussed the following Behavioral Modification  Strategies today: keep a strict food journal, increasing lean protein intake and decreasing simple carbohydrates   Rebekah [name] has agreed to follow up with our clinic in 2 weeks. She was informed of the importance of frequent follow up visits to maximize her success with intensive lifestyle modifications for her multiple health conditions.   OBESITY BEHAVIORAL INTERVENTION VISIT  Today's visit was # 12 out of 22.  Starting weight: 267 lbs Starting date: 01/01/17 Today's weight : 264 lbs  Today's date: 09/16/2017 Total lbs lost to date: 3 (Patients must lose 7 lbs in the first 6 months to continue with counseling)   ASK: We discussed the diagnosis of obesity with Rebekah [name] today and Rebekah [name] agreed to give [name] permission to discuss obesity behavioral modification therapy today.  ASSESS: Trany has the diagnosis of obesity and her BMI today is 49.91 Rebekah [name] is in the action stage of change   ADVISE: Rebekah [name] was educated on the multiple health risks of obesity as well as the benefit of weight loss to improve her health. She was advised of the need for long term treatment and the importance of lifestyle modifications.  AGREE: Multiple dietary modification options and treatment options were discussed and  Rebekah [name] agreed to the above obesity treatment plan.   Corey Skains, am acting as transcriptionist for [employer], PA-C I, Lacy [facility]ver[facility], have reviewed this note and agree with its content

## 2017-10-03 ENCOUNTER — Ambulatory Visit (INDEPENDENT_AMBULATORY_CARE_PROVIDER_SITE_OTHER): Payer: BC Managed Care – PPO | Admitting: Physician Assistant

## 2017-10-03 VITALS — BP 122/76 | HR 66 | Temp 98.4°F | Ht 61.0 in | Wt 261.0 lb

## 2017-10-03 DIAGNOSIS — E559 Vitamin D deficiency, unspecified: Secondary | ICD-10-CM

## 2017-10-03 DIAGNOSIS — Z6841 Body Mass Index (BMI) 40.0 and over, adult: Secondary | ICD-10-CM

## 2017-10-03 DIAGNOSIS — Z9189 Other specified personal risk factors, not elsewhere classified: Secondary | ICD-10-CM

## 2017-10-03 DIAGNOSIS — I10 Essential (primary) hypertension: Secondary | ICD-10-CM

## 2017-10-03 MED ORDER — VITAMIN D (ERGOCALCIFEROL) 1.25 MG (50000 UNIT) PO CAPS: 50000.0000 [IU] | ORAL_CAPSULE | ORAL | 0 refills | Status: DC

## 2017-10-03 NOTE — Progress Notes (Signed)
Office: [phone]  /  Fax: [phone]   HPI:   Chief Complaint: OBESITY Rebekah [name] is here to discuss her progress with her obesity treatment plan. She is on the keep a food journal with 1200 calories and 85 grams of protein daily and is following her eating plan approximately 100 % of the time. She states she is gardening and doing yard work. Rebekah [name] continues to do well with weight loss. She has been increasing her lean protein intake. She would like travel eating strategies. She requests a longer follow up time.  Her weight is 261 lb (118.4 kg) today and has had a weight loss of 3 pounds over a period of 2 weeks since her last visit. She has lost 6 lbs since starting treatment with [name].  Vitamin D Deficiency Rebekah [name] has a diagnosis of vitamin D deficiency. She is currently taking prescription Vit D and denies nausea, vomiting or muscle weakness.  At risk for osteopenia and osteoporosis Rebekah [name] is at higher risk of osteopenia and osteoporosis due to vitamin D deficiency.   Hypertension Rebekah [name] is a 62 y.o. female with hypertension. Rebekah [name]'s blood pressure is stable and she denies chest pain or shortness of breath. She is working weight loss to help control her blood pressure with the goal of decreasing her risk of heart attack and stroke. Rebekah [name]'s blood pressure is currently controlled.  ALLERGIES: Allergies  Allergen Reactions  . Beef-Derived Products Anaphylaxis    After tick bite, cannot eat beef, pork or lamb  . Lambs Quarters Anaphylaxis    After tick bite cannot eat beef, pork or lamb  . Mucinex [Guaifenesin Er] Anaphylaxis  . Pork-Derived Products Anaphylaxis    After tick bite cannot eat beef, pork or lamb  . Darvon [Propoxyphene] Other (See Comments)    Hallucinations   . Ace Inhibitors Swelling and Cough    Pedal Edema  . Hydromet [Hydrocodone-Homatropine] Itching and Other (See Comments)    Severe stomach pain-face itches.   . Sulfonamide Derivatives Rash     MEDICATIONS: Current Outpatient Medications on File Prior to Visit  Medication Sig Dispense Refill  . acetaminophen (TYLENOL) 500 MG tablet Take 500-1,000 mg by mouth every 6 (six) hours as needed for moderate pain or headache.    Marland Kitchen CALCIUM PO Take 500 mg by mouth daily.     . cetirizine (ZYRTEC) 10 MG tablet Take 10 mg by mouth at bedtime.     Marland Kitchen desmopressin (DDAVP) 0.2 MG tablet Take 0.6 mg by mouth at bedtime.    Marland Kitchen diltiazem (CARDIZEM) 30 MG tablet TAKE ONE TO TWO TABLETS BY MOUTH EVERY 6 HOURS AS NEEDED FOR  FAST  HEART  RATES 90 tablet 0  . diltiazem (CARTIA XT) 120 MG 24 hr capsule Take 1 capsule (120 mg total) by mouth daily. 90 capsule 3  . diphenhydrAMINE (BENADRYL) 50 MG tablet Take 50 mg by mouth at bedtime as needed for itching.    Marland Kitchen EPINEPHrine (EPIPEN) 0.3 mg/0.3 mL IJ SOAJ injection Inject 0.3 mLs (0.3 mg total) into the muscle once as needed (anaphylaxis). 1 Device 6  . escitalopram (LEXAPRO) 10 MG tablet TAKE 1 TABLET BY MOUTH ONCE DAILY 90 tablet 3  . flecainide (TAMBOCOR) 100 MG tablet TAKE 3 TABLETS BY MOUTH AT ONSET OF FAST HEART RATES AS NEEDED 270 tablet 0  . furosemide (LASIX) 20 MG tablet Take 1 tablet (20 mg total) by mouth daily as needed for edema. (Patient taking differently: Take 10 mg by mouth daily  at 2 PM. Take 10mg  daily at 2pm) 30 tablet 3  . levothyroxine (SYNTHROID, LEVOTHROID) 50 MCG tablet TAKE 1 TABLET BY MOUTH ONCE DAILY BEFORE BREAKFAST 90 tablet 2  . losartan (COZAAR) 50 MG tablet TAKE 1/2 (ONE-HALF) TABLET BY MOUTH IN THE MORNING AND 1 IN THE EVENING 135 tablet 1  . Multiple Vitamins-Minerals (MULTIVITAMIN ADULT PO) Take 1 capsule by mouth 2 (two) times daily. Journey 3+3 Bariatric Multivitamin    . OXYGEN Inhale 3 L/min into the lungs at bedtime. Continuously    . rivaroxaban (XARELTO) 20 MG TABS tablet Take 1 tablet (20 mg total) by mouth daily with supper. 90 tablet 3  . traMADol (ULTRAM) 50 MG tablet Take 1 tablet (50 mg total) by mouth every 6  (six) hours as needed. 30 tablet 5   No current facility-administered medications on file prior to visit.     PAST MEDICAL HISTORY: Past Medical History:  Diagnosis Date  . Anxiety   . Arthritis   . Bilateral bunions   . Complication of anesthesia   . Difficulty sleeping    prior sleep study did not reveal sleep apnea per patient  . DJD (degenerative joint disease)   . Dyspnea   . Dysrhythmia    a-fib  . Environmental allergies   . Food allergy   . Gallbladder problem   . GERD (gastroesophageal reflux disease)   . Heart murmur   . Hypertension    no meds after weight loss  . Hypothyroidism   . Incontinence of urine    at nite   . Joint pain   . Left atrial dilatation   . Left foot pain   . Leg edema   . Obesity    s/p gastric sleeve 12/2013 (previously weighed close to 400 lbs)  . Osteoarthritis   . Palpitations   . Paroxysmal atrial fibrillation (HCC)   . Pneumonia    hx of   . PONV (postoperative nausea and vomiting)   . Status post bilateral knee replacements   . Supplemental oxygen dependent    uses 2l/Christyann at night, states HR goes low and O2 drops  . Tuberculosis    had 6 month of INH due to exposure   . Vaginal vault prolapse     PAST SURGICAL HISTORY: Past Surgical History:  Procedure Laterality Date  . APPENDECTOMY    . CHOLECYSTECTOMY    . EYE SURGERY     03/2014 lens implant left lens   . FOOT ARTHRODESIS Left 03/15/2016   Procedure: TALONAVICULAR AND SUBTALAR ARTHRODESIS;  Surgeon: Wylene Simmer, MD;  Location: [facility];  Service: Orthopedics;  Laterality: Left;  Marland Kitchen GASTROC RECESSION EXTREMITY Left 03/15/2016   Procedure: LEFT GASTROC RECESSION;  Surgeon: Wylene Simmer, MD;  Location: [facility];  Service: Orthopedics;  Laterality: Left;  . JOINT REPLACEMENT  1999   L TOTAL KNEE  . LAPAROSCOPIC GASTRIC SLEEVE RESECTION N/A 01/04/2014   Procedure: LAPAROSCOPIC GASTRIC SLEEVE RESECTION;  Surgeon: Greer Pickerel, MD;  Location:  [facility];  Service: General;  Laterality: N/A;  . TONSILLECTOMY    . TOTAL KNEE ARTHROPLASTY Right 05/17/2015   Procedure: RIGHT TOTAL KNEE ARTHROPLASTY;  Surgeon: Paralee Cancel, MD;  Location: [facility];  Service: Orthopedics;  Laterality: Right;  . TRANSTHORACIC ECHOCARDIOGRAM  11/2012   EF 60-65%, mod LVH & mod conc hypertrophy, grade 1 diastolic dysfunction; LA mildly dilated; RV systolic pressure increased; PA peak pressure 73mmHg  . TUBAL LIGATION    . UPPER  GI ENDOSCOPY  01/04/2014   Procedure: UPPER GI ENDOSCOPY;  Surgeon: Greer Pickerel, MD;  Location: [facility];  Service: General;;    SOCIAL HISTORY: Social History   Tobacco Use  . Smoking status: Never Smoker  . Smokeless tobacco: Never Used  Substance Use Topics  . Alcohol use: No  . Drug use: No    FAMILY HISTORY: Family History  Problem Relation Age of Onset  . Diabetes Father   . Hyperlipidemia Father   . Hypertension Father   . Heart disease Father   . Sudden death Father   . Anxiety disorder Father   . Obesity Father   . Uterine cancer Mother   . Cervical cancer Mother   . Breast cancer Mother   . Cancer Mother        cervical and brreast cancer  . Hypertension Mother   . Hyperlipidemia Mother   . Obesity Mother   . Diabetes Maternal Grandmother     ROS: Review of Systems  Constitutional: Positive for weight loss.  Respiratory: Negative for shortness of breath.   Cardiovascular: Negative for chest pain.  Gastrointestinal: Negative for nausea and vomiting.  Musculoskeletal:       Negative muscle weakness    PHYSICAL EXAM: Blood pressure 122/76, pulse 66, temperature 98.4 F (36.9 C), temperature source Oral, height 5\' 1"  (1.549 m), weight 261 lb (118.4 kg), SpO2 94 %. Body mass index is 49.32 kg/m. Physical Exam  Constitutional: She is oriented to person, place, and time. She appears well-developed and well-nourished.  Cardiovascular: Normal rate.  Pulmonary/Chest: Effort normal.  Musculoskeletal: Normal  range of motion.  Neurological: She is oriented to person, place, and time.  Skin: Skin is warm and dry.  Psychiatric: She has a normal mood and affect. Her behavior is normal.  Vitals reviewed.   RECENT LABS AND TESTS: BMET    Component Value Date/Time   NA 146 (H) 05/02/2017 0850   NA 143 05/02/2017 0850   K 5.0 05/02/2017 0850   K 5.0 05/02/2017 0850   CL 102 05/02/2017 0850   CL 102 05/02/2017 0850   CO2 24 05/02/2017 0850   CO2 24 05/02/2017 0850   GLUCOSE 89 05/02/2017 0850   GLUCOSE 91 05/02/2017 0850   GLUCOSE 141 (H) 05/18/2015 0456   BUN 21 05/02/2017 0850   BUN 20 05/02/2017 0850   CREATININE 0.75 05/02/2017 0850   CREATININE 0.82 05/02/2017 0850   CALCIUM 10.1 05/02/2017 0850   CALCIUM 10.1 05/02/2017 0850   GFRNONAA 86 05/02/2017 0850   GFRNONAA 77 05/02/2017 0850   GFRAA 99 05/02/2017 0850   GFRAA 89 05/02/2017 0850   Lab Results  Component Value Date   HGBA1C 5.5 05/02/2017   HGBA1C 5.9 (H) 01/01/2017   HGBA1C 6.1 (H) 07/02/2013   HGBA1C (H) 09/24/2006    6.9 (NOTE)   The ADA recommends the following therapeutic goals for glycemic   control related to Hgb A1C measurement:   Goal of Therapy:   < 7.0% Hgb A1C   Action Suggested:  > 8.0% Hgb A1C   Ref:  Diabetes Care, 22, Suppl. 1, 1999   Lab Results  Component Value Date   INSULIN 7.9 05/02/2017   INSULIN 9.8 01/01/2017   CBC    Component Value Date/Time   WBC 7.6 01/01/2017 1335   WBC 8.0 05/18/2015 0456   RBC 4.91 01/01/2017 1335   RBC 3.29 (L) 05/18/2015 0456   HGB 13.6 01/01/2017 1335   HCT 42.6 01/01/2017 1335  PLT 117 (L) 05/18/2015 0456   MCV 87 01/01/2017 1335   MCH 27.7 01/01/2017 1335   MCH 29.2 05/18/2015 0456   MCHC 31.9 01/01/2017 1335   MCHC 32.3 05/18/2015 0456   RDW 14.7 01/01/2017 1335   LYMPHSABS 1.9 01/01/2017 1335   MONOABS 0.8 03/26/2015 0316   EOSABS 0.2 01/01/2017 1335   BASOSABS 0.1 01/01/2017 1335   Iron/TIBC/Ferritin/ %Sat No results found for: IRON, TIBC,  FERRITIN, IRONPCTSAT Lipid Panel     Component Value Date/Time   CHOL 189 05/02/2017 0850   TRIG 103 05/02/2017 0850   HDL 67 05/02/2017 0850   CHOLHDL 4.2 07/02/2013 1301   VLDL 43 (H) 07/02/2013 1301   LDLCALC 101 (H) 05/02/2017 0850   Hepatic Function Panel     Component Value Date/Time   PROT 7.6 05/02/2017 0850   ALBUMIN 4.4 05/02/2017 0850   AST 55 (H) 05/02/2017 0850   ALT 42 (H) 05/02/2017 0850   ALKPHOS 81 05/02/2017 0850   BILITOT <0.2 05/02/2017 0850      Component Value Date/Time   TSH 3.090 05/02/2017 0850   TSH 2.350 01/01/2017 1335   TSH 1.523 07/20/2014 1546  Results for Delbene, JOSHLYN BEADLE (MRN [id number]) as of 10/03/2017 10:52  Ref. Range 05/02/2017 08:50  Vitamin D, 25-Hydroxy Latest Ref Range: 30.0 - 100.0 ng/mL 43.5    ASSESSMENT AND PLAN: Vitamin D deficiency - Plan: Vitamin D, Ergocalciferol, (DRISDOL) 50000 units CAPS capsule  Essential hypertension  At risk for osteoporosis  Class 3 severe obesity with serious comorbidity and body mass index (BMI) of 45.0 to 49.9 in adult, unspecified obesity type ([place])  PLAN:  Vitamin D Deficiency Rebekah [name] was informed that low vitamin D levels contributes to fatigue and are associated with obesity, breast, and colon cancer. Rebekah [name] agrees to continue taking prescription Vit D @50 ,000 IU every week #4 and we will refill for 1 month. She will follow up for routine testing of vitamin D, at least 2-3 times per year. She was informed of the risk of over-replacement of vitamin D and agrees to not increase her dose unless she discusses this with [name] first. Rebekah [name] agrees to follow up with our clinic in 6 weeks.  At risk for osteopenia and osteoporosis Rebekah [name] is at risk for osteopenia and osteoporsis due to her vitamin D deficiency. She was encouraged to take her vitamin D and follow her higher calcium diet and increase strengthening exercise to help strengthen her bones and decrease her risk of osteopenia and  osteoporosis.  Hypertension We discussed sodium restriction, working on healthy weight loss, and a regular exercise program as the means to achieve improved blood pressure control. Rebekah [name] agreed with this plan and agreed to follow up as directed. We will continue to monitor her blood pressure as well as her progress with the above lifestyle modifications. She will continue her medications as prescribed and will watch for signs of hypotension as she continues her lifestyle modifications. Rebekah [name] agrees to follow up with our clinic in 6 weeks.  Obesity Rebekah [name] is currently in the action stage of change. As such, her goal is to continue with weight loss efforts She has agreed to portion control better and make smarter food choices, such as increase vegetables and decrease simple carbohydrates  Rebekah [name] has been instructed to work up to a goal of 150 minutes of combined cardio and strengthening exercise per week for weight loss and overall health benefits. We discussed the following Behavioral Modification Strategies today: travel eating strategies and  planning for success   Rebekah [name] has agreed to follow up with our clinic in 6 weeks. She was informed of the importance of frequent follow up visits to maximize her success with intensive lifestyle modifications for her multiple health conditions.   OBESITY BEHAVIORAL INTERVENTION VISIT  Today's visit was # 13 out of 22.  Starting weight: 267 lbs Starting date: 01/01/17 Today's weight : 261 lbs Today's date: 10/03/2017 Total lbs lost to date: 6 (Patients must lose 7 lbs in the first 6 months to continue with counseling)   ASK: We discussed the diagnosis of obesity with Rebekah [name] today and Rebekah [name] agreed to give [name] permission to discuss obesity behavioral modification therapy today.  ASSESS: Rebekah [name] has the diagnosis of obesity and her BMI today is 49.34 Rebekah [name] is in the action stage of change   ADVISE: Rebekah [name] was educated on the multiple health risks of obesity  as well as the benefit of weight loss to improve her health. She was advised of the need for long term treatment and the importance of lifestyle modifications.  AGREE: Multiple dietary modification options and treatment options were discussed and  Rebekah [name] agreed to the above obesity treatment plan.   Rebekah [name] Durie, am acting as transcriptionist for Lacy Duverney, PA-C I, Lacy Duverney [facility], have reviewed this note and agree with its content

## 2017-10-08 ENCOUNTER — Other Ambulatory Visit: Payer: Self-pay | Admitting: Internal Medicine

## 2017-10-08 ENCOUNTER — Other Ambulatory Visit: Payer: Self-pay | Admitting: Physician Assistant

## 2017-11-11 ENCOUNTER — Ambulatory Visit (INDEPENDENT_AMBULATORY_CARE_PROVIDER_SITE_OTHER): Payer: BC Managed Care – PPO | Admitting: Family Medicine

## 2017-11-11 VITALS — BP 135/83 | HR 60 | Temp 98.1°F | Ht 61.0 in | Wt 268.0 lb

## 2017-11-11 DIAGNOSIS — E559 Vitamin D deficiency, unspecified: Secondary | ICD-10-CM | POA: Diagnosis not present

## 2017-11-11 DIAGNOSIS — Z9189 Other specified personal risk factors, not elsewhere classified: Secondary | ICD-10-CM

## 2017-11-11 DIAGNOSIS — E119 Type 2 diabetes mellitus without complications: Secondary | ICD-10-CM

## 2017-11-11 DIAGNOSIS — Z6841 Body Mass Index (BMI) 40.0 and over, adult: Secondary | ICD-10-CM

## 2017-11-11 MED ORDER — VITAMIN D (ERGOCALCIFEROL) 1.25 MG (50000 UNIT) PO CAPS: 50000.0000 [IU] | ORAL_CAPSULE | ORAL | 0 refills | Status: DC

## 2017-11-12 NOTE — Progress Notes (Signed)
Office: [phone]  /  Fax: [phone]   HPI:   Chief Complaint: OBESITY Rebekah [name] is here to discuss her progress with her obesity treatment plan. She is on the portion control better and make smarter food choices, such as increase vegetables and decrease simple carbohydrates and is following her eating plan approximately 75 % of the time. She states she is exercising 0 minutes 0 times per week. Rebekah [name] has been traveling for the last month and increased eating out. She is status post sleeve gastrectomy and states her calories are rarely over 1,000-1,200 per day. Her last RMR states she is eating 1,600 kcal to maintain her weight.  Her weight is 268 lb (121.6 kg) today and has gained 7 pounds since her last visit. She has lost 0 lbs since starting treatment with [name].  Vitamin D Deficiency Rebekah [name] has a diagnosis of vitamin D deficiency. She is stable on prescription Vit D, last level not at goal. She denies nausea, vomiting or muscle weakness.  Diabetes II Rebekah [name] has a diagnosis of diabetes type II. Rebekah [name]'s last A1c was 5.5, well controlled but has gained weight since and may not be as controlled. She denies hypoglycemia. She has been working on intensive lifestyle modifications including diet, exercise, and weight loss to help control her blood glucose levels.  At risk for cardiovascular disease Rebekah [name] is at a higher than average risk for cardiovascular disease due to obesity and diabetes II. She currently denies any chest pain.  ALLERGIES: Allergies  Allergen Reactions  . Beef-Derived Products Anaphylaxis    After tick bite, cannot eat beef, pork or lamb  . Lambs Quarters Anaphylaxis    After tick bite cannot eat beef, pork or lamb  . Mucinex [Guaifenesin Er] Anaphylaxis  . Pork-Derived Products Anaphylaxis    After tick bite cannot eat beef, pork or lamb  . Darvon [Propoxyphene] Other (See Comments)    Hallucinations   . Ace Inhibitors Swelling and Cough    Pedal Edema  . Hydromet  [Hydrocodone-Homatropine] Itching and Other (See Comments)    Severe stomach pain-face itches.   . Sulfonamide Derivatives Rash    MEDICATIONS: Current Outpatient Medications on File Prior to Visit  Medication Sig Dispense Refill  . acetaminophen (TYLENOL) 500 MG tablet Take 500-1,000 mg by mouth every 6 (six) hours as needed for moderate pain or headache.    Marland Kitchen CALCIUM PO Take 500 mg by mouth daily.     Marland Kitchen CARTIA XT 120 MG 24 hr capsule TAKE 1 CAPSULE BY MOUTH ONCE DAILY 90 capsule 1  . cetirizine (ZYRTEC) 10 MG tablet Take 10 mg by mouth at bedtime.     Marland Kitchen desmopressin (DDAVP) 0.2 MG tablet Take 0.6 mg by mouth at bedtime.    Marland Kitchen diltiazem (CARDIZEM) 30 MG tablet TAKE ONE TO TWO TABLETS BY MOUTH EVERY 6 HOURS AS NEEDED FOR  FAST  HEART  RATES 90 tablet 0  . diphenhydrAMINE (BENADRYL) 50 MG tablet Take 50 mg by mouth at bedtime as needed for itching.    Marland Kitchen EPINEPHrine (EPIPEN) 0.3 mg/0.3 mL IJ SOAJ injection Inject 0.3 mLs (0.3 mg total) into the muscle once as needed (anaphylaxis). 1 Device 6  . escitalopram (LEXAPRO) 10 MG tablet TAKE 1 TABLET BY MOUTH ONCE DAILY 90 tablet 3  . flecainide (TAMBOCOR) 100 MG tablet TAKE 3 TABLETS BY MOUTH AT ONSET OF FAST HEART RATES AS NEEDED 270 tablet 0  . furosemide (LASIX) 20 MG tablet Take 1 tablet (20 mg total) by mouth  daily as needed for edema. (Patient taking differently: Take 10 mg by mouth daily at 2 PM. Take 10mg  daily at 2pm) 30 tablet 3  . levothyroxine (SYNTHROID, LEVOTHROID) 50 MCG tablet TAKE 1 TABLET BY MOUTH ONCE DAILY BEFORE BREAKFAST 90 tablet 2  . losartan (COZAAR) 50 MG tablet TAKE 1/2 (ONE-HALF) TABLET BY MOUTH IN THE MORNING AND 1 IN THE EVENING 135 tablet 1  . Multiple Vitamins-Minerals (MULTIVITAMIN ADULT PO) Take 1 capsule by mouth 2 (two) times daily. Journey 3+3 Bariatric Multivitamin    . OXYGEN Inhale 3 L/min into the lungs at bedtime. Continuously    . rivaroxaban (XARELTO) 20 MG TABS tablet Take 1 tablet (20 mg total) by mouth  daily with supper. 90 tablet 3  . traMADol (ULTRAM) 50 MG tablet Take 1 tablet (50 mg total) by mouth every 6 (six) hours as needed. 30 tablet 5   No current facility-administered medications on file prior to visit.     PAST MEDICAL HISTORY: Past Medical History:  Diagnosis Date  . Anxiety   . Arthritis   . Bilateral bunions   . Complication of anesthesia   . Difficulty sleeping    prior sleep study did not reveal sleep apnea per patient  . DJD (degenerative joint disease)   . Dyspnea   . Dysrhythmia    a-fib  . Environmental allergies   . Food allergy   . Gallbladder problem   . GERD (gastroesophageal reflux disease)   . Heart murmur   . Hypertension    no meds after weight loss  . Hypothyroidism   . Incontinence of urine    at nite   . Joint pain   . Left atrial dilatation   . Left foot pain   . Leg edema   . Obesity    s/p gastric sleeve 12/2013 (previously weighed close to 400 lbs)  . Osteoarthritis   . Palpitations   . Paroxysmal atrial fibrillation (HCC)   . Pneumonia    hx of   . PONV (postoperative nausea and vomiting)   . Status post bilateral knee replacements   . Supplemental oxygen dependent    uses 2l/[place] at night, states HR goes low and O2 drops  . Tuberculosis    had 6 month of INH due to exposure   . Vaginal vault prolapse     PAST SURGICAL HISTORY: Past Surgical History:  Procedure Laterality Date  . APPENDECTOMY    . CHOLECYSTECTOMY    . EYE SURGERY     03/2014 lens implant left lens   . FOOT ARTHRODESIS Left 03/15/2016   Procedure: TALONAVICULAR AND SUBTALAR ARTHRODESIS;  Surgeon: Wylene Simmer, MD;  Location: [place];  Service: Orthopedics;  Laterality: Left;  Marland Kitchen GASTROC RECESSION EXTREMITY Left 03/15/2016   Procedure: LEFT GASTROC RECESSION;  Surgeon: Wylene Simmer, MD;  Location: [place];  Service: Orthopedics;  Laterality: Left;  . JOINT REPLACEMENT  1999   L TOTAL KNEE  . LAPAROSCOPIC GASTRIC SLEEVE  RESECTION N/A 01/04/2014   Procedure: LAPAROSCOPIC GASTRIC SLEEVE RESECTION;  Surgeon: Greer Pickerel, MD;  Location: [facility];  Service: General;  Laterality: N/A;  . TONSILLECTOMY    . TOTAL KNEE ARTHROPLASTY Right 05/17/2015   Procedure: RIGHT TOTAL KNEE ARTHROPLASTY;  Surgeon: Paralee Cancel, MD;  Location: [facility];  Service: Orthopedics;  Laterality: Right;  . TRANSTHORACIC ECHOCARDIOGRAM  11/2012   EF 60-65%, mod LVH & mod conc hypertrophy, grade 1 diastolic dysfunction; LA mildly dilated; RV systolic pressure  increased; PA peak pressure 47mmHg  . TUBAL LIGATION    . UPPER GI ENDOSCOPY  01/04/2014   Procedure: UPPER GI ENDOSCOPY;  Surgeon: Greer Pickerel, MD;  Location: [facility];  Service: General;;    SOCIAL HISTORY: Social History   Tobacco Use  . Smoking status: Never Smoker  . Smokeless tobacco: Never Used  Substance Use Topics  . Alcohol use: No  . Drug use: No    FAMILY HISTORY: Family History  Problem Relation Age of Onset  . Diabetes Father   . Hyperlipidemia Father   . Hypertension Father   . Heart disease Father   . Sudden death Father   . Anxiety disorder Father   . Obesity Father   . Uterine cancer Mother   . Cervical cancer Mother   . Breast cancer Mother   . Cancer Mother        cervical and brreast cancer  . Hypertension Mother   . Hyperlipidemia Mother   . Obesity Mother   . Diabetes Maternal Grandmother     ROS: Review of Systems  Constitutional: Positive for weight loss.  Cardiovascular: Negative for chest pain.  Gastrointestinal: Negative for nausea and vomiting.  Musculoskeletal:       Negative muscle weakness  Endo/Heme/Allergies:       Negative hypoglycemia    PHYSICAL EXAM: Blood pressure 135/83, pulse 60, temperature 98.1 F (36.7 C), temperature source Oral, height 5\' 1"  (1.549 m), weight 268 lb (121.6 kg), SpO2 95 %. Body mass index is 50.64 kg/m. Physical Exam  Constitutional: She is oriented to person, place, and time. She appears  well-developed and well-nourished.  Cardiovascular: Normal rate.  Pulmonary/Chest: Effort normal.  Musculoskeletal: Normal range of motion.  Neurological: She is oriented to person, place, and time.  Skin: Skin is warm and dry.  Psychiatric: She has a normal mood and affect. Her behavior is normal.  Vitals reviewed.   RECENT LABS AND TESTS: BMET    Component Value Date/Time   NA 146 (H) 05/02/2017 0850   NA 143 05/02/2017 0850   K 5.0 05/02/2017 0850   K 5.0 05/02/2017 0850   CL 102 05/02/2017 0850   CL 102 05/02/2017 0850   CO2 24 05/02/2017 0850   CO2 24 05/02/2017 0850   GLUCOSE 89 05/02/2017 0850   GLUCOSE 91 05/02/2017 0850   GLUCOSE 141 (H) 05/18/2015 0456   BUN 21 05/02/2017 0850   BUN 20 05/02/2017 0850   CREATININE 0.75 05/02/2017 0850   CREATININE 0.82 05/02/2017 0850   CALCIUM 10.1 05/02/2017 0850   CALCIUM 10.1 05/02/2017 0850   GFRNONAA 86 05/02/2017 0850   GFRNONAA 77 05/02/2017 0850   GFRAA 99 05/02/2017 0850   GFRAA 89 05/02/2017 0850   Lab Results  Component Value Date   HGBA1C 5.5 05/02/2017   HGBA1C 5.9 (H) 01/01/2017   HGBA1C 6.1 (H) 07/02/2013   HGBA1C (H) 09/24/2006    6.9 (NOTE)   The ADA recommends the following therapeutic goals for glycemic   control related to Hgb A1C measurement:   Goal of Therapy:   < 7.0% Hgb A1C   Action Suggested:  > 8.0% Hgb A1C   Ref:  Diabetes Care, 22, Suppl. 1, 1999   Lab Results  Component Value Date   INSULIN 7.9 05/02/2017   INSULIN 9.8 01/01/2017   CBC    Component Value Date/Time   WBC 7.6 01/01/2017 1335   WBC 8.0 05/18/2015 0456   RBC 4.91 01/01/2017 1335   RBC  3.29 (L) 05/18/2015 0456   HGB 13.6 01/01/2017 1335   HCT 42.6 01/01/2017 1335   PLT 117 (L) 05/18/2015 0456   MCV 87 01/01/2017 1335   MCH 27.7 01/01/2017 1335   MCH 29.2 05/18/2015 0456   MCHC 31.9 01/01/2017 1335   MCHC 32.3 05/18/2015 0456   RDW 14.7 01/01/2017 1335   LYMPHSABS 1.9 01/01/2017 1335   MONOABS 0.8 03/26/2015 0316    EOSABS 0.2 01/01/2017 1335   BASOSABS 0.1 01/01/2017 1335   Iron/TIBC/Ferritin/ %Sat No results found for: IRON, TIBC, FERRITIN, IRONPCTSAT Lipid Panel     Component Value Date/Time   CHOL 189 05/02/2017 0850   TRIG 103 05/02/2017 0850   HDL 67 05/02/2017 0850   CHOLHDL 4.2 07/02/2013 1301   VLDL 43 (H) 07/02/2013 1301   LDLCALC 101 (H) 05/02/2017 0850   Hepatic Function Panel     Component Value Date/Time   PROT 7.6 05/02/2017 0850   ALBUMIN 4.4 05/02/2017 0850   AST 55 (H) 05/02/2017 0850   ALT 42 (H) 05/02/2017 0850   ALKPHOS 81 05/02/2017 0850   BILITOT <0.2 05/02/2017 0850      Component Value Date/Time   TSH 3.090 05/02/2017 0850   TSH 2.350 01/01/2017 1335   TSH 1.523 07/20/2014 1546  Results for Hemminger, LANAIYA LANTRY (MRN [id number]) as of 11/12/2017 12:54  Ref. Range 05/02/2017 08:50  Vitamin D, 25-Hydroxy Latest Ref Range: 30.0 - 100.0 ng/mL 43.5    ASSESSMENT AND PLAN: Vitamin D deficiency - Plan: Vitamin D, Ergocalciferol, (DRISDOL) 50000 units CAPS capsule  Type 2 diabetes mellitus without complication, without long-term current use of insulin (HCC)  At risk for heart disease  Class 3 severe obesity with serious comorbidity and body mass index (BMI) of 50.0 to 59.9 in adult, unspecified obesity type ([place])  PLAN:  Vitamin D Deficiency Rebekah [name] was informed that low vitamin D levels contributes to fatigue and are associated with obesity, breast, and colon cancer. Rebekah [name] agrees to continue taking prescription Vit D @50 ,000 IU every week #4 and we will refill for 1 month. She will follow up for routine testing of vitamin D, at least 2-3 times per year. She was informed of the risk of over-replacement of vitamin D and agrees to not increase her dose unless she discusses this with [name] first. Rebekah [name] agrees to follow up with our clinic in 3 weeks and we will recheck labs at that time.  Diabetes II Rebekah [name] has been given extensive diabetes education by myself today including ideal  fasting and post-prandial blood glucose readings, individual ideal Hgb A1c goals and hypoglycemia prevention. We discussed the importance of good blood sugar control to decrease the likelihood of diabetic complications such as nephropathy, neuropathy, limb loss, blindness, coronary artery disease, and death. We discussed the importance of intensive lifestyle modification including diet, exercise and weight loss as the first line treatment for diabetes. Rebekah [name] will continue diet and exercise, and she agrees to follow up with our clinic in 3 weeks and we will recheck labs at that time.  Cardiovascular risk counselling Rebekah [name] was given extended (15 minutes) coronary artery disease prevention counseling today. She is 62 y.o. female and has risk factors for heart disease including obesity and diabetes II. We discussed intensive lifestyle modifications today with an emphasis on specific weight loss instructions and strategies. Pt was also informed of the importance of increasing exercise and decreasing saturated fats to help prevent heart disease.  Rebekah [name] is currently in the action stage of change. As  such, her goal is to continue with weight loss efforts She has agreed to keep a food journal with 1200 calories and 70+ grams of protein daily Rebekah [name] has been instructed to work up to a goal of 150 minutes of combined cardio and strengthening exercise per week for weight loss and overall health benefits. We discussed the following Behavioral Modification Strategies today: increasing vegetables, decrease eating out, work on meal planning and easy cooking plans, and travel eating strategies  We will repeat RMR at next visit in the morning and fasting to get the most accurate number possible.  Rebekah [name] has agreed to follow up with our clinic in 3 weeks. She was informed of the importance of frequent follow up visits to maximize her success with intensive lifestyle modifications for her multiple health conditions.   OBESITY  BEHAVIORAL INTERVENTION VISIT  Today's visit was # 14 out of 22.  Starting weight: 267 lbs Starting date: 01/01/17 Today's weight : 268 lbs  Today's date: 11/11/2017 Total lbs lost to date: 0    ASK: We discussed the diagnosis of obesity with Rebekah [name] today and Rebekah [name] agreed to give [name] permission to discuss obesity behavioral modification therapy today.  ASSESS: Rebekah [name] has the diagnosis of obesity and her BMI today is 50.66 Rebekah [name] is in the action stage of change   ADVISE: Rebekah [name] was educated on the multiple health risks of obesity as well as the benefit of weight loss to improve her health. She was advised of the need for long term treatment and the importance of lifestyle modifications.  AGREE: Multiple dietary modification options and treatment options were discussed and  Rebekah [name] agreed to the above obesity treatment plan.  I, Trixie Dredge, am acting as transcriptionist for Dennard Nip, MD  I have reviewed the above documentation for accuracy and completeness, and I agree with the above. -Dennard Nip, MD

## 2017-12-02 ENCOUNTER — Ambulatory Visit (INDEPENDENT_AMBULATORY_CARE_PROVIDER_SITE_OTHER): Payer: BC Managed Care – PPO | Admitting: Family Medicine

## 2017-12-02 VITALS — BP 142/80 | HR 98 | Temp 97.9°F | Ht 61.0 in | Wt 262.0 lb

## 2017-12-02 DIAGNOSIS — E8881 Metabolic syndrome: Secondary | ICD-10-CM

## 2017-12-02 DIAGNOSIS — E559 Vitamin D deficiency, unspecified: Secondary | ICD-10-CM

## 2017-12-02 DIAGNOSIS — R945 Abnormal results of liver function studies: Secondary | ICD-10-CM | POA: Diagnosis not present

## 2017-12-02 DIAGNOSIS — Z9189 Other specified personal risk factors, not elsewhere classified: Secondary | ICD-10-CM

## 2017-12-02 DIAGNOSIS — I10 Essential (primary) hypertension: Secondary | ICD-10-CM

## 2017-12-02 DIAGNOSIS — R7989 Other specified abnormal findings of blood chemistry: Secondary | ICD-10-CM

## 2017-12-02 DIAGNOSIS — Z6841 Body Mass Index (BMI) 40.0 and over, adult: Secondary | ICD-10-CM

## 2017-12-02 MED ORDER — VITAMIN D (ERGOCALCIFEROL) 1.25 MG (50000 UNIT) PO CAPS: 50000.0000 [IU] | ORAL_CAPSULE | ORAL | 0 refills | Status: DC

## 2017-12-02 NOTE — Progress Notes (Signed)
Office: [phone]  /  Fax: [phone]   HPI:   Chief Complaint: OBESITY Elza is here to discuss her progress with her obesity treatment plan. She is on the keep a food journal with 1200 calories and 70+ grams of protein daily and is following her eating plan approximately 100 % of the time. She states she is swimming and walking for 20 minutes 7 times per week. Khristina is status post gastric sleeve. She went on vacation to [place] and states she gained 24 lbs in 2 weeks while eating 1200 kcal. She went back to Atkins and kept "net carbohydrates" to 20 grams per day and eating [phone] per day. Her RMR was 1670.  Her weight is 262 lb (118.8 kg) today and has had a weight loss of 6 pounds over a period of 3 weeks since her last visit. She has lost 4 lbs since starting treatment with [name].  Vitamin D Deficiency Anapaula has a diagnosis of vitamin D deficiency. She is stable on prescription Vit D, due for labs. She denies nausea, vomiting or muscle weakness.  Hypertension MADALAINE PORTIER is a 62 y.o. female with hypertension. Christyne is on losartan and Cardizem, her blood pressure is elevated today. She is followed by her primary care physician. She denies chest pain or headache. She is working weight loss to help control her blood pressure with the goal of decreasing her risk of heart attack and stroke. Malone's blood pressure is not currently controlled.  At risk for cardiovascular disease Damika is at a higher than average risk for cardiovascular disease due to obesity. She currently denies any chest pain.  Elevated LFT Merdith has a diagnosis of elevated ALT. Her BMI is over 40. She is attempting to improve with diet. She denies abdominal pain or jaundice.  Insulin Resistance Keiondra has a diagnosis of insulin resistance based on her elevated fasting insulin level >5. Although Alixandria's blood glucose readings are still under good control, insulin resistance puts her at greater risk of metabolic syndrome and  diabetes. She is attempting to improve with diet and decreasing simple carbohydrates to decrease risk of diabetes.  ALLERGIES: Allergies  Allergen Reactions  . Beef-Derived Products Anaphylaxis    After tick bite, cannot eat beef, pork or lamb  . Lambs Quarters Anaphylaxis    After tick bite cannot eat beef, pork or lamb  . Mucinex [Guaifenesin Er] Anaphylaxis  . Pork-Derived Products Anaphylaxis    After tick bite cannot eat beef, pork or lamb  . Darvon [Propoxyphene] Other (See Comments)    Hallucinations   . Ace Inhibitors Swelling and Cough    Pedal Edema  . Hydromet [Hydrocodone-Homatropine] Itching and Other (See Comments)    Severe stomach pain-face itches.   . Sulfonamide Derivatives Rash    MEDICATIONS: Current Outpatient Medications on File Prior to Visit  Medication Sig Dispense Refill  . acetaminophen (TYLENOL) 500 MG tablet Take 500-1,000 mg by mouth every 6 (six) hours as needed for moderate pain or headache.    Marland Kitchen CALCIUM PO Take 500 mg by mouth daily.     Marland Kitchen CARTIA XT 120 MG 24 hr capsule TAKE 1 CAPSULE BY MOUTH ONCE DAILY 90 capsule 1  . cetirizine (ZYRTEC) 10 MG tablet Take 10 mg by mouth at bedtime.     Marland Kitchen desmopressin (DDAVP) 0.2 MG tablet Take 0.6 mg by mouth at bedtime.    Marland Kitchen diltiazem (CARDIZEM) 30 MG tablet TAKE ONE TO TWO TABLETS BY MOUTH EVERY 6 HOURS AS NEEDED  FOR  FAST  HEART  RATES 90 tablet 0  . diphenhydrAMINE (BENADRYL) 50 MG tablet Take 50 mg by mouth at bedtime as needed for itching.    Marland Kitchen EPINEPHrine (EPIPEN) 0.3 mg/0.3 mL IJ SOAJ injection Inject 0.3 mLs (0.3 mg total) into the muscle once as needed (anaphylaxis). 1 Device 6  . escitalopram (LEXAPRO) 10 MG tablet TAKE 1 TABLET BY MOUTH ONCE DAILY 90 tablet 3  . flecainide (TAMBOCOR) 100 MG tablet TAKE 3 TABLETS BY MOUTH AT ONSET OF FAST HEART RATES AS NEEDED 270 tablet 0  . furosemide (LASIX) 20 MG tablet Take 1 tablet (20 mg total) by mouth daily as needed for edema. (Patient taking differently: Take  10 mg by mouth daily at 2 PM. Take 10mg  daily at 2pm) 30 tablet 3  . levothyroxine (SYNTHROID, LEVOTHROID) 50 MCG tablet TAKE 1 TABLET BY MOUTH ONCE DAILY BEFORE BREAKFAST 90 tablet 2  . losartan (COZAAR) 50 MG tablet TAKE 1/2 (ONE-HALF) TABLET BY MOUTH IN THE MORNING AND 1 IN THE EVENING 135 tablet 1  . Multiple Vitamins-Minerals (MULTIVITAMIN ADULT PO) Take 1 capsule by mouth 2 (two) times daily. Journey 3+3 Bariatric Multivitamin    . OXYGEN Inhale 3 L/min into the lungs at bedtime. Continuously    . rivaroxaban (XARELTO) 20 MG TABS tablet Take 1 tablet (20 mg total) by mouth daily with supper. 90 tablet 3  . traMADol (ULTRAM) 50 MG tablet Take 1 tablet (50 mg total) by mouth every 6 (six) hours as needed. 30 tablet 5  . Vitamin D, Ergocalciferol, (DRISDOL) 50000 units CAPS capsule Take 1 capsule (50,000 Units total) by mouth every 7 (seven) days. 4 capsule 0   No current facility-administered medications on file prior to visit.     PAST MEDICAL HISTORY: Past Medical History:  Diagnosis Date  . Anxiety   . Arthritis   . Bilateral bunions   . Complication of anesthesia   . Difficulty sleeping    prior sleep study did not reveal sleep apnea per patient  . DJD (degenerative joint disease)   . Dyspnea   . Dysrhythmia    a-fib  . Environmental allergies   . Food allergy   . Gallbladder problem   . GERD (gastroesophageal reflux disease)   . Heart murmur   . Hypertension    no meds after weight loss  . Hypothyroidism   . Incontinence of urine    at nite   . Joint pain   . Left atrial dilatation   . Left foot pain   . Leg edema   . Obesity    s/p gastric sleeve 12/2013 (previously weighed close to 400 lbs)  . Osteoarthritis   . Palpitations   . Paroxysmal atrial fibrillation (HCC)   . Pneumonia    hx of   . PONV (postoperative nausea and vomiting)   . Status post bilateral knee replacements   . Supplemental oxygen dependent    uses 2l/[place] at night, states HR goes low and O2  drops  . Tuberculosis    had 6 month of INH due to exposure   . Vaginal vault prolapse     PAST SURGICAL HISTORY: Past Surgical History:  Procedure Laterality Date  . APPENDECTOMY    . CHOLECYSTECTOMY    . EYE SURGERY     03/2014 lens implant left lens   . FOOT ARTHRODESIS Left 03/15/2016   Procedure: TALONAVICULAR AND SUBTALAR ARTHRODESIS;  Surgeon: Wylene Simmer, MD;  Location: [facility];  Service: Orthopedics;  Laterality: Left;  Marland Kitchen GASTROC RECESSION EXTREMITY Left 03/15/2016   Procedure: LEFT GASTROC RECESSION;  Surgeon: Wylene Simmer, MD;  Location: [facility];  Service: Orthopedics;  Laterality: Left;  . JOINT REPLACEMENT  1999   L TOTAL KNEE  . LAPAROSCOPIC GASTRIC SLEEVE RESECTION N/A 01/04/2014   Procedure: LAPAROSCOPIC GASTRIC SLEEVE RESECTION;  Surgeon: Greer Pickerel, MD;  Location: [facility];  Service: General;  Laterality: N/A;  . TONSILLECTOMY    . TOTAL KNEE ARTHROPLASTY Right 05/17/2015   Procedure: RIGHT TOTAL KNEE ARTHROPLASTY;  Surgeon: Paralee Cancel, MD;  Location: [facility];  Service: Orthopedics;  Laterality: Right;  . TRANSTHORACIC ECHOCARDIOGRAM  11/2012   EF 60-65%, mod LVH & mod conc hypertrophy, grade 1 diastolic dysfunction; LA mildly dilated; RV systolic pressure increased; PA peak pressure 44mmHg  . TUBAL LIGATION    . UPPER GI ENDOSCOPY  01/04/2014   Procedure: UPPER GI ENDOSCOPY;  Surgeon: Greer Pickerel, MD;  Location: [facility];  Service: General;;    SOCIAL HISTORY: Social History   Tobacco Use  . Smoking status: Never Smoker  . Smokeless tobacco: Never Used  Substance Use Topics  . Alcohol use: No  . Drug use: No    FAMILY HISTORY: Family History  Problem Relation Age of Onset  . Diabetes Father   . Hyperlipidemia Father   . Hypertension Father   . Heart disease Father   . Sudden death Father   . Anxiety disorder Father   . Obesity Father   . Uterine cancer Mother   . Cervical cancer Mother   . Breast cancer Mother   .  Cancer Mother        cervical and brreast cancer  . Hypertension Mother   . Hyperlipidemia Mother   . Obesity Mother   . Diabetes Maternal Grandmother     ROS: Review of Systems  Constitutional: Positive for weight loss.  Eyes:       Negative jaundice  Cardiovascular: Negative for chest pain.  Gastrointestinal: Negative for abdominal pain, nausea and vomiting.  Musculoskeletal:       Negative muscle weakness  Neurological: Negative for headaches.    PHYSICAL EXAM: Blood pressure (!) 142/80, pulse 98, temperature 97.9 F (36.6 C), temperature source Oral, height 5\' 1"  (1.549 m), weight 262 lb (118.8 kg), SpO2 95 %. Body mass index is 49.5 kg/m. Physical Exam  Constitutional: She is oriented to person, place, and time. She appears well-developed and well-nourished.  Cardiovascular: Normal rate.  Pulmonary/Chest: Effort normal.  Musculoskeletal: Normal range of motion.  Neurological: She is oriented to person, place, and time.  Skin: Skin is warm and dry.  Psychiatric: She has a normal mood and affect. Her behavior is normal.  Vitals reviewed.   RECENT LABS AND TESTS: BMET    Component Value Date/Time   NA 146 (H) 05/02/2017 0850   NA 143 05/02/2017 0850   K 5.0 05/02/2017 0850   K 5.0 05/02/2017 0850   CL 102 05/02/2017 0850   CL 102 05/02/2017 0850   CO2 24 05/02/2017 0850   CO2 24 05/02/2017 0850   GLUCOSE 89 05/02/2017 0850   GLUCOSE 91 05/02/2017 0850   GLUCOSE 141 (H) 05/18/2015 0456   BUN 21 05/02/2017 0850   BUN 20 05/02/2017 0850   CREATININE 0.75 05/02/2017 0850   CREATININE 0.82 05/02/2017 0850   CALCIUM 10.1 05/02/2017 0850   CALCIUM 10.1 05/02/2017 0850   GFRNONAA 86 05/02/2017 0850   GFRNONAA 77 05/02/2017 0850  GFRAA 99 05/02/2017 0850   GFRAA 89 05/02/2017 0850   Lab Results  Component Value Date   HGBA1C 5.5 05/02/2017   HGBA1C 5.9 (H) 01/01/2017   HGBA1C 6.1 (H) 07/02/2013   HGBA1C (H) 09/24/2006    6.9 (NOTE)   The ADA recommends  the following therapeutic goals for glycemic   control related to Hgb A1C measurement:   Goal of Therapy:   < 7.0% Hgb A1C   Action Suggested:  > 8.0% Hgb A1C   Ref:  Diabetes Care, 22, Suppl. 1, 1999   Lab Results  Component Value Date   INSULIN 7.9 05/02/2017   INSULIN 9.8 01/01/2017   CBC    Component Value Date/Time   WBC 7.6 01/01/2017 1335   WBC 8.0 05/18/2015 0456   RBC 4.91 01/01/2017 1335   RBC 3.29 (L) 05/18/2015 0456   HGB 13.6 01/01/2017 1335   HCT 42.6 01/01/2017 1335   PLT 117 (L) 05/18/2015 0456   MCV 87 01/01/2017 1335   MCH 27.7 01/01/2017 1335   MCH 29.2 05/18/2015 0456   MCHC 31.9 01/01/2017 1335   MCHC 32.3 05/18/2015 0456   RDW 14.7 01/01/2017 1335   LYMPHSABS 1.9 01/01/2017 1335   MONOABS 0.8 03/26/2015 0316   EOSABS 0.2 01/01/2017 1335   BASOSABS 0.1 01/01/2017 1335   Iron/TIBC/Ferritin/ %Sat No results found for: IRON, TIBC, FERRITIN, IRONPCTSAT Lipid Panel     Component Value Date/Time   CHOL 189 05/02/2017 0850   TRIG 103 05/02/2017 0850   HDL 67 05/02/2017 0850   CHOLHDL 4.2 07/02/2013 1301   VLDL 43 (H) 07/02/2013 1301   LDLCALC 101 (H) 05/02/2017 0850   Hepatic Function Panel     Component Value Date/Time   PROT 7.6 05/02/2017 0850   ALBUMIN 4.4 05/02/2017 0850   AST 55 (H) 05/02/2017 0850   ALT 42 (H) 05/02/2017 0850   ALKPHOS 81 05/02/2017 0850   BILITOT <0.2 05/02/2017 0850      Component Value Date/Time   TSH 3.090 05/02/2017 0850   TSH 2.350 01/01/2017 1335   TSH 1.523 07/20/2014 1546  Results for Gayle, BRIAHNA PESCADOR (MRN [id number]) as of 12/02/2017 10:51  Ref. Range 05/02/2017 08:50  Vitamin D, 25-Hydroxy Latest Ref Range: 30.0 - 100.0 ng/mL 43.5    ASSESSMENT AND PLAN: Vitamin D deficiency - Plan: Vitamin D, Ergocalciferol, (DRISDOL) 50000 units CAPS capsule  Essential hypertension  Elevated LFTs - Plan: Lipid Panel With LDL/HDL Ratio  Insulin resistance - Plan: Comprehensive metabolic panel, Hemoglobin A1c, Insulin,  random  At risk for heart disease  Class 3 severe obesity with serious comorbidity and body mass index (BMI) of 45.0 to 49.9 in adult, unspecified obesity type (HCC)  PLAN:  Vitamin D Deficiency Candie was informed that low vitamin D levels contributes to fatigue and are associated with obesity, breast, and colon cancer. Enyah agrees to continue taking prescription Vit D @50 ,000 IU every week #4 and we will refill for 1 month. She will follow up for routine testing of vitamin D, at least 2-3 times per year. She was informed of the risk of over-replacement of vitamin D and agrees to not increase her dose unless she discusses this with [name] first. We will check labs and Loletha agrees to follow up with our clinic in 3 weeks.  Hypertension We discussed sodium restriction, working on healthy weight loss, and a regular exercise program as the means to achieve improved blood pressure control. Miniya agreed with this plan and agreed to  follow up as directed. We will continue to monitor her blood pressure as well as her progress with the above lifestyle modifications. She will continue her medications, diet, and exercise and she will watch for signs of hypotension as she continues her lifestyle modifications. We will check labs and Adelaida agrees to follow up with our clinic in 3 weeks.  Cardiovascular risk counselling Aurie was given extended (15 minutes) coronary artery disease prevention counseling today. She is 62 y.o. female and has risk factors for heart disease including obesity and hypertension. We discussed intensive lifestyle modifications today with an emphasis on specific weight loss instructions and strategies. Pt was also informed of the importance of increasing exercise and decreasing saturated fats to help prevent heart disease.  Elevated LFT We discussed the likely diagnosis of non alcoholic fatty liver disease today and how this condition is obesity related. Amberlyn was educated on her risk of developing  NASH or even liver failure and th only proven treatment for NAFLD was weight loss. Ayerim agreed to continue with her weight loss efforts with healthier diet and exercise as an essential part of her treatment plan. We will check labs and Debrah agrees to follow up with our clinic in 3 weeks.  Insulin Resistance Ashika will continue to work on weight loss, exercise, and decreasing simple carbohydrates in her diet to help decrease the risk of diabetes. We dicussed metformin including benefits and risks. She was informed that eating too many simple carbohydrates or too many calories at one sitting increases the likelihood of GI side effects. Havanna declined metformin for now and prescription was not written today. We will check labs and Terika agrees to follow up with our clinic in 3 weeks as directed to monitor her progress.  Obesity Latalia is currently in the action stage of change. As such, her goal is to continue with weight loss efforts She has agreed to continue to do Atkins Syerra has been instructed to work up to a goal of 150 minutes of combined cardio and strengthening exercise per week for weight loss and overall health benefits. We discussed the following Behavioral Modification Strategies today: decreasing simple carbohydrates, increasing vegetables, and increase H20 intake   Annalyssa has agreed to follow up with our clinic in 3 weeks. She was informed of the importance of frequent follow up visits to maximize her success with intensive lifestyle modifications for her multiple health conditions.   OBESITY BEHAVIORAL INTERVENTION VISIT  Today's visit was # 15 out of 22.  Starting weight: 267 lbs Starting date: 01/01/17 Today's weight : 262 lbs  Today's date: 12/02/2017 Total lbs lost to date: 4    ASK: We discussed the diagnosis of obesity with [name] today and Yazmen agreed to give [name] permission to discuss obesity behavioral modification therapy today.  ASSESS: Londynn has the diagnosis of  obesity and her BMI today is 49.53 Alaiya is in the action stage of change   ADVISE: Kissie was educated on the multiple health risks of obesity as well as the benefit of weight loss to improve her health. She was advised of the need for long term treatment and the importance of lifestyle modifications.  AGREE: Multiple dietary modification options and treatment options were discussed and  Tammie agreed to the above obesity treatment plan.  I, Trixie Dredge, am acting as transcriptionist for Dennard Nip, MD  I have reviewed the above documentation for accuracy and completeness, and I agree with the above. -Dennard Nip, MD

## 2017-12-03 ENCOUNTER — Ambulatory Visit: Payer: BC Managed Care – PPO | Admitting: Internal Medicine

## 2017-12-03 LAB — COMPREHENSIVE METABOLIC PANEL
ALBUMIN: 4.6 g/dL (ref 3.6–4.8)
ALT: 20 IU/L (ref 0–32)
AST: 23 IU/L (ref 0–40)
Albumin/Globulin Ratio: 1.3 (ref 1.2–2.2)
Alkaline Phosphatase: 79 IU/L (ref 39–117)
BILIRUBIN TOTAL: 0.5 mg/dL (ref 0.0–1.2)
BUN / CREAT RATIO: 29 — AB (ref 12–28)
BUN: 25 mg/dL (ref 8–27)
CHLORIDE: 99 mmol/L (ref 96–106)
CO2: 24 mmol/L (ref 20–29)
Calcium: 10.3 mg/dL (ref 8.7–10.3)
Creatinine, Ser: 0.87 mg/dL (ref 0.57–1.00)
GFR calc non Af Amer: 71 mL/min/{1.73_m2} (ref >59)
GFR, EST AFRICAN AMERICAN: 82 mL/min/{1.73_m2} (ref >59)
GLOBULIN, TOTAL: 3.5 g/dL (ref 1.5–4.5)
GLUCOSE: 91 mg/dL (ref 65–99)
Potassium: 4.5 mmol/L (ref 3.5–5.2)
SODIUM: 141 mmol/L (ref 134–144)
TOTAL PROTEIN: 8.1 g/dL (ref 6.0–8.5)

## 2017-12-03 LAB — LIPID PANEL WITH LDL/HDL RATIO
CHOLESTEROL TOTAL: 195 mg/dL (ref 100–199)
HDL: 67 mg/dL (ref >39)
LDL CALC: 111 mg/dL — AB (ref 0–99)
LDL/HDL RATIO: 1.7 ratio (ref 0.0–3.2)
TRIGLYCERIDES: 87 mg/dL (ref 0–149)
VLDL CHOLESTEROL CAL: 17 mg/dL (ref 5–40)

## 2017-12-03 LAB — INSULIN, RANDOM: INSULIN: 9.2 u[IU]/mL (ref 2.6–24.9)

## 2017-12-03 LAB — HEMOGLOBIN A1C
Est. average glucose Bld gHb Est-mCnc: 117 mg/dL
Hgb A1c MFr Bld: 5.7 % — ABNORMAL HIGH (ref 4.8–5.6)

## 2017-12-07 ENCOUNTER — Encounter (INDEPENDENT_AMBULATORY_CARE_PROVIDER_SITE_OTHER): Payer: Self-pay | Admitting: Family Medicine

## 2017-12-13 DIAGNOSIS — N3281 Overactive bladder: Secondary | ICD-10-CM | POA: Insufficient documentation

## 2017-12-13 DIAGNOSIS — R351 Nocturia: Secondary | ICD-10-CM | POA: Insufficient documentation

## 2017-12-24 ENCOUNTER — Ambulatory Visit (INDEPENDENT_AMBULATORY_CARE_PROVIDER_SITE_OTHER): Payer: BC Managed Care – PPO | Admitting: Family Medicine

## 2017-12-24 VITALS — BP 111/73 | HR 66 | Temp 98.1°F | Ht 61.0 in | Wt 255.0 lb

## 2017-12-24 DIAGNOSIS — Z6841 Body Mass Index (BMI) 40.0 and over, adult: Secondary | ICD-10-CM

## 2017-12-24 DIAGNOSIS — E559 Vitamin D deficiency, unspecified: Secondary | ICD-10-CM | POA: Diagnosis not present

## 2017-12-24 DIAGNOSIS — Z9189 Other specified personal risk factors, not elsewhere classified: Secondary | ICD-10-CM | POA: Diagnosis not present

## 2017-12-25 LAB — VITAMIN D 25 HYDROXY (VIT D DEFICIENCY, FRACTURES): Vit D, 25-Hydroxy: 45.6 ng/mL (ref 30.0–100.0)

## 2017-12-26 NOTE — Progress Notes (Signed)
Office: [phone]  /  Fax: [phone]   HPI:   Chief Complaint: OBESITY Rebekah [name] is here to discuss her progress with her obesity treatment plan. She is on the Atkins diet and is following her eating plan approximately 100 % of the time. She states she is exercising 0 minutes 0 times per week. Rebekah [name] has done well with weight loss following a strick Atkins plan. She feels she is doing best with this plan and she states her hunger is controlled.  Her weight is 255 lb (115.7 kg) today and has had a weight loss of 7 pounds over a period of 3 weeks since her last visit. She has lost 12 lbs since starting treatment with [name].  Vitamin D Deficiency Rebekah [name] has a diagnosis of vitamin D deficiency. She is stable on prescription Vit D, but level is not yet at goal. She denies nausea, vomiting or muscle weakness.  At risk for osteopenia and osteoporosis Rebekah [name] is at higher risk of osteopenia and osteoporosis due to vitamin D deficiency.   ALLERGIES: Allergies  Allergen Reactions  . Beef-Derived Products Anaphylaxis    After tick bite, cannot eat beef, pork or lamb  . Lambs Quarters Anaphylaxis    After tick bite cannot eat beef, pork or lamb  . Mucinex [Guaifenesin Er] Anaphylaxis  . Pork-Derived Products Anaphylaxis    After tick bite cannot eat beef, pork or lamb  . Darvon [Propoxyphene] Other (See Comments)    Hallucinations   . Ace Inhibitors Swelling and Cough    Pedal Edema  . Hydromet [Hydrocodone-Homatropine] Itching and Other (See Comments)    Severe stomach pain-face itches.   . Sulfonamide Derivatives Rash    MEDICATIONS: Current Outpatient Medications on File Prior to Visit  Medication Sig Dispense Refill  . acetaminophen (TYLENOL) 500 MG tablet Take 500-1,000 mg by mouth every 6 (six) hours as needed for moderate pain or headache.    Marland Kitchen CALCIUM PO Take 500 mg by mouth daily.     Marland Kitchen CARTIA XT 120 MG 24 hr capsule TAKE 1 CAPSULE BY MOUTH ONCE DAILY 90 capsule 1  . cetirizine  (ZYRTEC) 10 MG tablet Take 10 mg by mouth at bedtime.     Marland Kitchen desmopressin (DDAVP) 0.2 MG tablet Take 0.6 mg by mouth at bedtime.    Marland Kitchen diltiazem (CARDIZEM) 30 MG tablet TAKE ONE TO TWO TABLETS BY MOUTH EVERY 6 HOURS AS NEEDED FOR  FAST  HEART  RATES 90 tablet 0  . diphenhydrAMINE (BENADRYL) 50 MG tablet Take 50 mg by mouth at bedtime as needed for itching.    Marland Kitchen EPINEPHrine (EPIPEN) 0.3 mg/0.3 mL IJ SOAJ injection Inject 0.3 mLs (0.3 mg total) into the muscle once as needed (anaphylaxis). 1 Device 6  . escitalopram (LEXAPRO) 10 MG tablet TAKE 1 TABLET BY MOUTH ONCE DAILY 90 tablet 3  . flecainide (TAMBOCOR) 100 MG tablet TAKE 3 TABLETS BY MOUTH AT ONSET OF FAST HEART RATES AS NEEDED 270 tablet 0  . furosemide (LASIX) 20 MG tablet Take 1 tablet (20 mg total) by mouth daily as needed for edema. (Patient taking differently: Take 10 mg by mouth daily at 2 PM. Take 10mg  daily at 2pm) 30 tablet 3  . levothyroxine (SYNTHROID, LEVOTHROID) 50 MCG tablet TAKE 1 TABLET BY MOUTH ONCE DAILY BEFORE BREAKFAST 90 tablet 2  . losartan (COZAAR) 50 MG tablet TAKE 1/2 (ONE-HALF) TABLET BY MOUTH IN THE MORNING AND 1 IN THE EVENING 135 tablet 1  . Multiple Vitamins-Minerals (MULTIVITAMIN ADULT  PO) Take 1 capsule by mouth 2 (two) times daily. Journey 3+3 Bariatric Multivitamin    . oxybutynin (DITROPAN-XL) 10 MG 24 hr tablet Take 10 mg by mouth at bedtime.    . OXYGEN Inhale 3 L/min into the lungs at bedtime. Continuously    . rivaroxaban (XARELTO) 20 MG TABS tablet Take 1 tablet (20 mg total) by mouth daily with supper. 90 tablet 3  . traMADol (ULTRAM) 50 MG tablet Take 1 tablet (50 mg total) by mouth every 6 (six) hours as needed. 30 tablet 5  . Vitamin D, Ergocalciferol, (DRISDOL) 50000 units CAPS capsule Take 1 capsule (50,000 Units total) by mouth every 7 (seven) days. 4 capsule 0   No current facility-administered medications on file prior to visit.     PAST MEDICAL HISTORY: Past Medical History:  Diagnosis Date    . Anxiety   . Arthritis   . Bilateral bunions   . Complication of anesthesia   . Difficulty sleeping    prior sleep study did not reveal sleep apnea per patient  . DJD (degenerative joint disease)   . Dyspnea   . Dysrhythmia    a-fib  . Environmental allergies   . Food allergy   . Gallbladder problem   . GERD (gastroesophageal reflux disease)   . Heart murmur   . Hypertension    no meds after weight loss  . Hypothyroidism   . Incontinence of urine    at nite   . Joint pain   . Left atrial dilatation   . Left foot pain   . Leg edema   . Obesity    s/p gastric sleeve 12/2013 (previously weighed close to 400 lbs)  . Osteoarthritis   . Palpitations   . Paroxysmal atrial fibrillation (HCC)   . Pneumonia    hx of   . PONV (postoperative nausea and vomiting)   . Status post bilateral knee replacements   . Supplemental oxygen dependent    uses 2l/[place] at night, states HR goes low and O2 drops  . Tuberculosis    had 6 month of INH due to exposure   . Vaginal vault prolapse     PAST SURGICAL HISTORY: Past Surgical History:  Procedure Laterality Date  . APPENDECTOMY    . CHOLECYSTECTOMY    . EYE SURGERY     03/2014 lens implant left lens   . FOOT ARTHRODESIS Left 03/15/2016   Procedure: TALONAVICULAR AND SUBTALAR ARTHRODESIS;  Surgeon: Wylene Simmer, MD;  Location: [facility];  Service: Orthopedics;  Laterality: Left;  Marland Kitchen GASTROC RECESSION EXTREMITY Left 03/15/2016   Procedure: LEFT GASTROC RECESSION;  Surgeon: Wylene Simmer, MD;  Location: [facility];  Service: Orthopedics;  Laterality: Left;  . JOINT REPLACEMENT  1999   L TOTAL KNEE  . LAPAROSCOPIC GASTRIC SLEEVE RESECTION N/A 01/04/2014   Procedure: LAPAROSCOPIC GASTRIC SLEEVE RESECTION;  Surgeon: Greer Pickerel, MD;  Location: [facility];  Service: General;  Laterality: N/A;  . TONSILLECTOMY    . TOTAL KNEE ARTHROPLASTY Right 05/17/2015   Procedure: RIGHT TOTAL KNEE ARTHROPLASTY;  Surgeon: Paralee Cancel, MD;  Location: [facility];  Service: Orthopedics;  Laterality: Right;  . TRANSTHORACIC ECHOCARDIOGRAM  11/2012   EF 60-65%, mod LVH & mod conc hypertrophy, grade 1 diastolic dysfunction; LA mildly dilated; RV systolic pressure increased; PA peak pressure 21mmHg  . TUBAL LIGATION    . UPPER GI ENDOSCOPY  01/04/2014   Procedure: UPPER GI ENDOSCOPY;  Surgeon: Greer Pickerel, MD;  Location:  [facility];  Service: General;;    SOCIAL HISTORY: Social History   Tobacco Use  . Smoking status: Never Smoker  . Smokeless tobacco: Never Used  Substance Use Topics  . Alcohol use: No  . Drug use: No    FAMILY HISTORY: Family History  Problem Relation Age of Onset  . Diabetes Father   . Hyperlipidemia Father   . Hypertension Father   . Heart disease Father   . Sudden death Father   . Anxiety disorder Father   . Obesity Father   . Uterine cancer Mother   . Cervical cancer Mother   . Breast cancer Mother   . Cancer Mother        cervical and brreast cancer  . Hypertension Mother   . Hyperlipidemia Mother   . Obesity Mother   . Diabetes Maternal Grandmother     ROS: Review of Systems  Constitutional: Positive for weight loss.  Gastrointestinal: Negative for nausea and vomiting.  Musculoskeletal:       Negative muscle weakness    PHYSICAL EXAM: Blood pressure 111/73, pulse 66, temperature 98.1 F (36.7 C), temperature source Oral, height 5\' 1"  (1.549 m), weight 255 lb (115.7 kg), SpO2 94 %. Body mass index is 48.18 kg/m. Physical Exam  Constitutional: She is oriented to person, place, and time. She appears well-developed and well-nourished.  Cardiovascular: Normal rate.  Pulmonary/Chest: Effort normal.  Musculoskeletal: Normal range of motion.  Neurological: She is oriented to person, place, and time.  Skin: Skin is warm and dry.  Psychiatric: She has a normal mood and affect. Her behavior is normal.  Vitals reviewed.   RECENT LABS AND TESTS: BMET    Component Value Date/Time    NA 141 12/02/2017 1016   K 4.5 12/02/2017 1016   CL 99 12/02/2017 1016   CO2 24 12/02/2017 1016   GLUCOSE 91 12/02/2017 1016   GLUCOSE 141 (H) 05/18/2015 0456   BUN 25 12/02/2017 1016   CREATININE 0.87 12/02/2017 1016   CALCIUM 10.3 12/02/2017 1016   GFRNONAA 71 12/02/2017 1016   GFRAA 82 12/02/2017 1016   Lab Results  Component Value Date   HGBA1C 5.7 (H) 12/02/2017   HGBA1C 5.5 05/02/2017   HGBA1C 5.9 (H) 01/01/2017   HGBA1C 6.1 (H) 07/02/2013   HGBA1C (H) 09/24/2006    6.9 (NOTE)   The ADA recommends the following therapeutic goals for glycemic   control related to Hgb A1C measurement:   Goal of Therapy:   < 7.0% Hgb A1C   Action Suggested:  > 8.0% Hgb A1C   Ref:  Diabetes Care, 22, Suppl. 1, 1999   Lab Results  Component Value Date   INSULIN 9.2 12/02/2017   INSULIN 7.9 05/02/2017   INSULIN 9.8 01/01/2017   CBC    Component Value Date/Time   WBC 7.6 01/01/2017 1335   WBC 8.0 05/18/2015 0456   RBC 4.91 01/01/2017 1335   RBC 3.29 (L) 05/18/2015 0456   HGB 13.6 01/01/2017 1335   HCT 42.6 01/01/2017 1335   PLT 117 (L) 05/18/2015 0456   MCV 87 01/01/2017 1335   MCH 27.7 01/01/2017 1335   MCH 29.2 05/18/2015 0456   MCHC 31.9 01/01/2017 1335   MCHC 32.3 05/18/2015 0456   RDW 14.7 01/01/2017 1335   LYMPHSABS 1.9 01/01/2017 1335   MONOABS 0.8 03/26/2015 0316   EOSABS 0.2 01/01/2017 1335   BASOSABS 0.1 01/01/2017 1335   Iron/TIBC/Ferritin/ %Sat No results found for: IRON, TIBC, FERRITIN, IRONPCTSAT Lipid Panel  Component Value Date/Time   CHOL 195 12/02/2017 1016   TRIG 87 12/02/2017 1016   HDL 67 12/02/2017 1016   CHOLHDL 4.2 07/02/2013 1301   VLDL 43 (H) 07/02/2013 1301   LDLCALC 111 (H) 12/02/2017 1016   Hepatic Function Panel     Component Value Date/Time   PROT 8.1 12/02/2017 1016   ALBUMIN 4.6 12/02/2017 1016   AST 23 12/02/2017 1016   ALT 20 12/02/2017 1016   ALKPHOS 79 12/02/2017 1016   BILITOT 0.5 12/02/2017 1016      Component Value  Date/Time   TSH 3.090 05/02/2017 0850   TSH 2.350 01/01/2017 1335   TSH 1.523 07/20/2014 1546  Results for Zappulla, EVALIE HARGRAVES (MRN [id number]) as of 12/26/2017 07:16  Ref. Range 12/24/2017 12:35  Vitamin D, 25-Hydroxy Latest Ref Range: 30.0 - 100.0 ng/mL 45.6    ASSESSMENT AND PLAN: Vitamin D deficiency - Plan: VITAMIN D 25 Hydroxy (Vit-D Deficiency, Fractures)  At risk for osteoporosis  Class 3 severe obesity with serious comorbidity and body mass index (BMI) of 45.0 to 49.9 in adult, unspecified obesity type (Rebekah [name])  PLAN:  Vitamin D Deficiency Rebekah [name] was informed that low vitamin D levels contributes to fatigue and are associated with obesity, breast, and colon cancer. Rebekah [name] agrees to continue taking prescription Vit D @50 ,000 IU every week #4 and we will refill for 1 month. She will follow up for routine testing of vitamin D, at least 2-3 times per year. She was informed of the risk of over-replacement of vitamin D and agrees to not increase her dose unless she discusses this with [name] first. We will check labs and Rebekah [name] agrees to follow up with our clinic in 3 to 4 weeks.  At risk for osteopenia and osteoporosis Rebekah [name] was given extended  (15 minutes) osteoporosis prevention counseling today. Rebekah [name] is at risk for osteopenia and osteoporsis due to her vitamin D deficiency. She was encouraged to take her vitamin D and follow her higher calcium diet and increase strengthening exercise to help strengthen her bones and decrease her risk of osteopenia and osteoporosis.  Obesity Rebekah [name] is currently in the action stage of change. As such, her goal is to continue with weight loss efforts She has agreed to continue with Atkins plan, as is Rebekah [name] has been instructed to work up to a goal of 150 minutes of combined cardio and strengthening exercise per week or use exercise bands for weight loss and overall health benefits. Handout was given. We discussed the following Behavioral Modification Strategies today:  increasing lean protein intake, decreasing simple carbohydrates  and work on meal planning and easy cooking plans I am concerned that she will not continue with this plan and will then gain all of her weight back when she stops. I will continue to monitor closely.  Rebekah [name] has agreed to follow up with our clinic in 3 to 4 weeks. She was informed of the importance of frequent follow up visits to maximize her success with intensive lifestyle modifications for her multiple health conditions.   OBESITY BEHAVIORAL INTERVENTION VISIT  Today's visit was # 16   Starting weight: 267 lbs Starting date: 01/01/17 Today's weight : 255 lbs  Today's date: 12/24/2017 Total lbs lost to date: 12    ASK: We discussed the diagnosis of obesity with Rebekah [name] today and Rebekah [name] agreed to give [name] permission to discuss obesity behavioral modification therapy today.  ASSESS: Rebekah [name] has the diagnosis of obesity and her BMI today is 48.21 Rebekah [name]  is in the action stage of change   ADVISE: Rebekah [name] was educated on the multiple health risks of obesity as well as the benefit of weight loss to improve her health. She was advised of the need for long term treatment and the importance of lifestyle modifications to improve her current health and to decrease her risk of future health problems.  AGREE: Multiple dietary modification options and treatment options were discussed and  Rebekah [name] agreed to follow the recommendations documented in the above note.  ARRANGE: Rebekah [name] was educated on the importance of frequent visits to treat obesity as outlined per CMS and USPSTF guidelines and agreed to schedule her next follow up appointment today.  I, Trixie Dredge, am acting as transcriptionist for Dennard Nip, MD  I have reviewed the above documentation for accuracy and completeness, and I agree with the above. -Dennard Nip, MD

## 2017-12-27 ENCOUNTER — Other Ambulatory Visit: Payer: Self-pay | Admitting: Internal Medicine

## 2018-01-03 ENCOUNTER — Ambulatory Visit: Payer: BC Managed Care – PPO | Admitting: Internal Medicine

## 2018-01-03 ENCOUNTER — Encounter: Payer: Self-pay | Admitting: Internal Medicine

## 2018-01-03 ENCOUNTER — Encounter

## 2018-01-03 VITALS — BP 130/80 | HR 64 | Ht 61.0 in | Wt 257.0 lb

## 2018-01-03 DIAGNOSIS — I480 Paroxysmal atrial fibrillation: Secondary | ICD-10-CM

## 2018-01-03 DIAGNOSIS — I10 Essential (primary) hypertension: Secondary | ICD-10-CM

## 2018-01-03 MED ORDER — LOSARTAN POTASSIUM 50 MG PO TABS: 50.0000 mg | ORAL_TABLET | Freq: Two times a day (BID) | ORAL | 3 refills | Status: DC

## 2018-01-03 MED ORDER — FLECAINIDE ACETATE 100 MG PO TABS: ORAL_TABLET | ORAL | 0 refills | Status: DC

## 2018-01-03 MED ORDER — DILTIAZEM HCL 30 MG PO TABS: ORAL_TABLET | ORAL | 0 refills | Status: DC

## 2018-01-03 NOTE — Progress Notes (Signed)
OFFICE NOTE  Chief Complaint:  Occasional palpitations  Primary Care Physician: Terald Sleeper, PA-C  HPI:  Rebekah [name] is a pleasant 62 year old female with a past medical history significant for her super morbid obesity, chronic hypoxia, hypertension, GERD and prior knee replacement. She was previously followed by Dr. Rollene Fare and was being evaluated for bariatric surgery. She is here to establish new care with me today and for preoperative evaluation. 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 She appears he seen Dr. Rollene Fare will order an echocardiogram on August of 2014 which showed moderate thickening of the left ventricle with EF of 60-65%, mildly increased RV systolic pressure and no significant valvular disease. There was left atrial enlargement. She denies any chest pain but does have expected shortness of breath with exertion. Lower extremity venous Dopplers were performed which show no evidence of significant venous insufficiency.    Rebekah [name] returns today for follow-up. She seems to be doing remarkably well. She underwent gastric sleeve surgery and has had close to 60 pound weight loss. Unfortunately she was complicated by postoperative atrial fibrillation. She was seen by Dr. Martinique and placed on Cardizem. Subsequently she converted to sinus rhythm within 24 hours spontaneously. She's had no recurrence since that time and has not been additionally anticoagulated. She was not continued on Cardizem. She does have an elevated CHADSVASC score of 2.  I saw Rebekah [name] back in the office today. Please see her recent notes when I saw her in the emergency room at [facility]. She was noted to be in atrial fibrillation again and given flecainide to help convert her, but this was not successful. She is placed on diltiazem and in addition to  metoprolol. Rate slowed down and eventually she cardioverted on her own. We also started her on Xarelto which she's continues to take. Overall she's had very good rate control his heart rate is remained in the mid 40s to mid 60s. Blood pressure is somewhat labile and ranges between the low 90s up to the 140s. Overall she feels fairly well. She still has a few episodes of palpitations during the week when she feels that she is in A. fib although she feels like she is in A. fib today and her rhythm is sinus. She may not be sensitive to A. fib anymore. 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  I saw Rebekah [name] back in the office today. She's had remarkable weight loss. In fact her weight after sleeve gastrectomy is down 288 pounds. Her main issue now is excess tissue for which she's tried to get plastic surgery but has been denied multiple times. She reports no further palpitations or atrial fibrillation. She continues to take Xarelto without any bleeding complications. She continues to use nocturnal oxygen due to hypoxia, although her sleep study indicated no sleep apnea, her overnight oximetry study did indicate hypoxia with a low O2 sats ration of 86%. This was, of course prior to the recent 80-100 pound weight loss. She also reports recently she's been having epistaxis. In fact she had an episode that lasted for 10-12 hours, she used a box of 40 micro-tampons to try to get it to stop and then eventually tried to go to the emergency room. She is using nasal saline but feels that the oxygen at night is probably drying  her nose out.  Rebekah [name] was seen today for an urgent add-on. This morning she was awakened by her alarm clock around 5 AM and noticed that her heart was racing when she woke up. It did not slow down right away and she felt increasingly more fatigue. She said after taking a shower she felt dizzy and had some chest pressure. Her heart rate was very elevated and she tried to take  it noting it was "greater than 200". She decided then take 300 mg of flecainide. Subsequently she took 2 diltiazem's and after several hours he decided to come in for her appointment with the orthopedist. She says that on the way in to the office her heart rate slowed down and she felt better. When she saw her orthopedist they felt that she should be reevaluated by [name] here. Currently she is asymptomatic. EKG was performed showing sinus rhythm with sinus arrhythmia at 81.  Rebekah [name] returns today for follow-up. She had been reporting some worsening leg swelling however this seems to be improved somewhat today. She has had no recurrent paroxysmal arrhythmias. She continues to want to use the pocket pill strategy.  11/21/2016  Rebekah [name] returns a for follow-up. Is been over year since I last saw her. She was last seen in the A. fib clinic for follow-up and is reported very infrequent use of flecainide. She says that she's used it a few times over the past year and has been successful in converting her to sinus rhythm. Of most notable is that she's had significant weight gain. Weight in March 2017 was 188 pounds and now up to 271 pounds. She was 204 pounds in January. She says this is typical to many factors including immobility after foot surgery, social stressors, brother who had a stroke and poor eating habits. She has a significant understanding of nutrition and her dietary needs but has had difficulty balancing that. She reports that she is consuming only 600-700 cal a day and has lost 20 pounds but has recently plateaued.  03/15/2017  Rebekah [name] returns today for follow-up.  I last saw her about 3 months ago.  We were working on her blood pressure which has been elevated.  Mostly it appears that her blood pressure is elevated in the morning.  She does not have sleep apnea however has had infrequent nocturnal oxygen desaturations and uses oxygen at night.  This may be contributing to her elevated blood  pressure in the morning.  Recently she has been taking extra losartan 50 mg at night but then takes 25 mg in the morning.  I reviewed her blood home blood pressures and they appear to be well controlled between 275-170 systolic and the 01V and 49S diastolic.  She reports infrequent atrial fibrillation.  The episodes however now are longer lasting and may last throughout the day, despite taking her flecainide.  They eventually go away however it is not clear if it works as quickly.  We considered and discussed monitoring and I presented to her an option of using the alive core application.  01/03/2018  Rebekah [name] is seen today in follow-up.  She reported recently having had some occasional palpitations.  She thought she experienced an allergic reaction and took up 200 mg of Benadryl over 4 hours.  Absolutely she had some A. fib and then she took her flecainide within 24 hours.  I strongly advised against this at this point as there is significant interaction between Benadryl and flecainide which could potentiate  the levels of flecainide and potentially cause QT prolongation.  He had considered using her EpiPen, and I supported this if she ever feels that she may be having anaphylaxis.  Overall, she denies any chest pain or worsening shortness of breath.  She is been working with the weight management center and unfortunately has not lost a lot of weight but recently started on the original Atkins diet and has had some benefit there.  Blood pressures were running slightly higher and she increased her medicine dose to 50 mg twice daily of the losartan.  I reviewed her home blood pressure readings and the appear to be well controlled.  She is tolerating Xarelto without any difficulty.  And as I mentioned she is only had a couple of episodes of A. fib for which she has needed the pocket pill flecainide.  PMHx:  Past Medical History:  Diagnosis Date  . Anxiety   . Arthritis   . Bilateral bunions   .  Complication of anesthesia   . Difficulty sleeping    prior sleep study did not reveal sleep apnea per patient  . DJD (degenerative joint disease)   . Dyspnea   . Dysrhythmia    a-fib  . Environmental allergies   . Food allergy   . Gallbladder problem   . GERD (gastroesophageal reflux disease)   . Heart murmur   . Hypertension    no meds after weight loss  . Hypothyroidism   . Incontinence of urine    at nite   . Joint pain   . Left atrial dilatation   . Left foot pain   . Leg edema   . Obesity    s/p gastric sleeve 12/2013 (previously weighed close to 400 lbs)  . Osteoarthritis   . Palpitations   . Paroxysmal atrial fibrillation (HCC)   . Pneumonia    hx of   . PONV (postoperative nausea and vomiting)   . Status post bilateral knee replacements   . Supplemental oxygen dependent    uses 2l/[place] at night, states HR goes low and O2 drops  . Tuberculosis    had 6 month of INH due to exposure   . Vaginal vault prolapse     Past Surgical History:  Procedure Laterality Date  . APPENDECTOMY    . CHOLECYSTECTOMY    . EYE SURGERY     03/2014 lens implant left lens   . FOOT ARTHRODESIS Left 03/15/2016   Procedure: TALONAVICULAR AND SUBTALAR ARTHRODESIS;  Surgeon: Wylene Simmer, MD;  Location: [place];  Service: Orthopedics;  Laterality: Left;  Marland Kitchen GASTROC RECESSION EXTREMITY Left 03/15/2016   Procedure: LEFT GASTROC RECESSION;  Surgeon: Wylene Simmer, MD;  Location: [place];  Service: Orthopedics;  Laterality: Left;  . JOINT REPLACEMENT  1999   L TOTAL KNEE  . LAPAROSCOPIC GASTRIC SLEEVE RESECTION N/A 01/04/2014   Procedure: LAPAROSCOPIC GASTRIC SLEEVE RESECTION;  Surgeon: Greer Pickerel, MD;  Location: [facility];  Service: General;  Laterality: N/A;  . TONSILLECTOMY    . TOTAL KNEE ARTHROPLASTY Right 05/17/2015   Procedure: RIGHT TOTAL KNEE ARTHROPLASTY;  Surgeon: Paralee Cancel, MD;  Location: [facility];  Service: Orthopedics;  Laterality: Right;  .  TRANSTHORACIC ECHOCARDIOGRAM  11/2012   EF 60-65%, mod LVH & mod conc hypertrophy, grade 1 diastolic dysfunction; LA mildly dilated; RV systolic pressure increased; PA peak pressure 29mmHg  . TUBAL LIGATION    . UPPER GI ENDOSCOPY  01/04/2014   Procedure: UPPER GI ENDOSCOPY;  Surgeon: Greer Pickerel, MD;  Location: [facility];  Service: General;;    FAMHx:  Family History  Problem Relation Age of Onset  . Diabetes Father   . Hyperlipidemia Father   . Hypertension Father   . Heart disease Father   . Sudden death Father   . Anxiety disorder Father   . Obesity Father   . Uterine cancer Mother   . Cervical cancer Mother   . Breast cancer Mother   . Cancer Mother        cervical and brreast cancer  . Hypertension Mother   . Hyperlipidemia Mother   . Obesity Mother   . Diabetes Maternal Grandmother     SOCHx:   reports that she has never smoked. She has never used smokeless tobacco. She reports that she does not drink alcohol or use drugs.  ALLERGIES:  Allergies  Allergen Reactions  . Beef-Derived Products Anaphylaxis    After tick bite, cannot eat beef, pork or lamb  . Lambs Quarters Anaphylaxis    After tick bite cannot eat beef, pork or lamb  . Mucinex [Guaifenesin Er] Anaphylaxis  . Pork-Derived Products Anaphylaxis    After tick bite cannot eat beef, pork or lamb  . Darvon [Propoxyphene] Other (See Comments)    Hallucinations   . Ace Inhibitors Swelling and Cough    Pedal Edema  . Hydromet [Hydrocodone-Homatropine] Itching and Other (See Comments)    Severe stomach pain-face itches.   . Sulfonamide Derivatives Rash    ROS: Pertinent items noted in HPI and remainder of comprehensive ROS otherwise negative.  HOME MEDS: Current Outpatient Medications  Medication Sig Dispense Refill  . acetaminophen (TYLENOL) 500 MG tablet Take 500-1,000 mg by mouth every 6 (six) hours as needed for moderate pain or headache.    Marland Kitchen CALCIUM PO Take 500 mg by mouth daily.     Marland Kitchen CARTIA XT 120  MG 24 hr capsule TAKE 1 CAPSULE BY MOUTH ONCE DAILY 90 capsule 1  . cetirizine (ZYRTEC) 10 MG tablet Take 10 mg by mouth at bedtime.     Marland Kitchen desmopressin (DDAVP) 0.2 MG tablet Take 0.6 mg by mouth at bedtime.    Marland Kitchen diltiazem (CARDIZEM) 30 MG tablet TAKE ONE TO TWO TABLETS BY MOUTH EVERY 6 HOURS AS NEEDED FOR  FAST  HEART  RATES 90 tablet 0  . diphenhydrAMINE (BENADRYL) 50 MG tablet Take 50 mg by mouth at bedtime as needed for itching.    Marland Kitchen EPINEPHrine (EPIPEN) 0.3 mg/0.3 mL IJ SOAJ injection Inject 0.3 mLs (0.3 mg total) into the muscle once as needed (anaphylaxis). 1 Device 6  . escitalopram (LEXAPRO) 10 MG tablet TAKE 1 TABLET BY MOUTH ONCE DAILY 90 tablet 3  . flecainide (TAMBOCOR) 100 MG tablet TAKE 3 TABLETS BY MOUTH AT ONSET OF FAST HEART RATES AS NEEDED 270 tablet 0  . furosemide (LASIX) 20 MG tablet Take 1 tablet (20 mg total) by mouth daily as needed for edema. (Patient taking differently: Take 10 mg by mouth daily at 2 PM. Take 10mg  daily at 2pm) 30 tablet 3  . levothyroxine (SYNTHROID, LEVOTHROID) 50 MCG tablet TAKE 1 TABLET BY MOUTH ONCE DAILY BEFORE BREAKFAST 90 tablet 2  . losartan (COZAAR) 50 MG tablet TAKE 1/2 (ONE-HALF) TABLET BY MOUTH IN THE MORNING AND 1 IN THE EVENING (Patient taking differently: Take 50 mg by mouth 2 (two) times daily. ) 135 tablet 1  . Multiple Vitamins-Minerals (MULTIVITAMIN ADULT PO) Take 1 capsule by mouth 2 (two) times  daily. Journey 3+3 Bariatric Multivitamin    . oxybutynin (DITROPAN-XL) 10 MG 24 hr tablet Take 10 mg by mouth at bedtime.    . OXYGEN Inhale 3 L/min into the lungs at bedtime. Continuously    . traMADol (ULTRAM) 50 MG tablet Take 1 tablet (50 mg total) by mouth every 6 (six) hours as needed. 30 tablet 5  . Vitamin D, Ergocalciferol, (DRISDOL) 50000 units CAPS capsule Take 1 capsule (50,000 Units total) by mouth every 7 (seven) days. 4 capsule 0  . XARELTO 20 MG TABS tablet TAKE 1 TABLET BY MOUTH ONCE DAILY WITH SUPPER 90 tablet 1   No current  facility-administered medications for this visit.     LABS/IMAGING: No results found for this or any previous visit (from the past 48 hour(s)). No results found.  VITALS: BP 130/80 (BP Location: Left Arm, Patient Position: Sitting, Cuff Size: Large)   Pulse 64   Ht 5\' 1"  (1.549 m)   Wt 257 lb (116.6 kg)   BMI 48.56 kg/m   EXAM: General appearance: alert, no distress and morbidly obese Neck: no carotid bruit, no JVD and thyroid not enlarged, symmetric, no tenderness/mass/nodules Lungs: clear to auscultation bilaterally Heart: regular rate and rhythm, S1, S2 normal, no murmur, click, rub or gallop Abdomen: soft, non-tender; bowel sounds normal; no masses,  no organomegaly Extremities: No ankle edema, excess tissue and firmness around the upper calf and knee areas Pulses: 2+ and symmetric Skin: Skin color, texture, turgor normal. No rashes or lesions Neurologic: Grossly normal Psych: Pleasant  EKG: This rhythm at 64, moderate voltage criteria for LVH, nonspecific T wave changes-personally reviewed  ASSESSMENT: 1. Paroxysmal atrial fibrillation, recurrent- on pocket pill therapy 2. Bilateral leg edema - minimal 3. Morbid obesity with significant weight gain and prior bariatric surgery 4. Essential hypertension  PLAN: 1.    Rebekah [name] has not had any significant PAF recently but did have one episode for which she used her flecainide.  This was in the setting of possible anaphylaxis for which she took more than the recommended dose of Benadryl and did not use her EpiPen.  I encouraged her to reach for the EpiPen first if she is exceeded her Benadryl dose without benefit.  She is advised that Benadryl should not be used within the same 24-hour.  His flecainide due to the increased risk of potentiating flecainide levels and QT prolongation.  I have encouraged continued work with her seeing Dr. Leafy Ro and weight loss.  Hopefully the new Atkins diet will be helpful for her.  Follow-up  in 1 year.  Pixie Casino, MD, [facility], [place] Director of the Advanced Lipid Disorders &  Cardiovascular Risk Reduction Clinic Diplomate of the American Board of Clinical Lipidology Attending Cardiologist  Direct Dial: [phone]  Fax: [phone]  Website:  [email or web address] 01/03/2018, 9:01 AM

## 2018-01-03 NOTE — Patient Instructions (Addendum)
Your physician wants you to follow-up in: 12 months with Dr. Hilty. You will receive a reminder letter in the mail two months in advance. If you don't receive a letter, please call our office to schedule the follow-up appointment.  

## 2018-01-14 ENCOUNTER — Encounter: Payer: Self-pay | Admitting: Physician Assistant

## 2018-01-14 ENCOUNTER — Other Ambulatory Visit: Payer: Self-pay | Admitting: Physician Assistant

## 2018-01-14 MED ORDER — AZITHROMYCIN 250 MG PO TABS: ORAL_TABLET | ORAL | 0 refills | Status: DC

## 2018-01-14 MED ORDER — PREDNISONE 10 MG (21) PO TBPK: ORAL_TABLET | ORAL | 0 refills | Status: DC

## 2018-01-14 NOTE — Progress Notes (Signed)
z-pak 

## 2018-01-16 ENCOUNTER — Ambulatory Visit (INDEPENDENT_AMBULATORY_CARE_PROVIDER_SITE_OTHER): Payer: BC Managed Care – PPO | Admitting: Family Medicine

## 2018-01-16 VITALS — BP 116/76 | HR 70 | Temp 98.4°F | Ht 61.0 in | Wt 248.0 lb

## 2018-01-16 DIAGNOSIS — T782XXD Anaphylactic shock, unspecified, subsequent encounter: Secondary | ICD-10-CM

## 2018-01-16 DIAGNOSIS — Z6841 Body Mass Index (BMI) 40.0 and over, adult: Secondary | ICD-10-CM

## 2018-01-16 DIAGNOSIS — Z9189 Other specified personal risk factors, not elsewhere classified: Secondary | ICD-10-CM | POA: Diagnosis not present

## 2018-01-16 MED ORDER — EPINEPHRINE 0.3 MG/0.3ML IJ SOAJ: 0.3000 mg | Freq: Once | INTRAMUSCULAR | 6 refills | Status: DC | PRN

## 2018-01-16 NOTE — Progress Notes (Signed)
Office: [phone]  /  Fax: [phone]   HPI:   Chief Complaint: OBESITY Rebekah [name] is here to discuss her progress with her obesity treatment plan. She is on the Atkins diet and is following her eating plan approximately 100 % of the time. She states she is exercising with resistance bands 20 minutes 5 times per week. Rebekah [name] is doing well with weight loss while following Atkins. She notes hunger is controlled. She recently started prednisone for laryngitis and already noted some water weight gain.  Her weight is 248 lb (112.5 kg) today and has had a weight loss of 7 pounds over a period of 3 weeks since her last visit. She has lost 19 lbs since starting treatment with [name].  Anaphylaxis She has a history of anaphylaxis to multiple foods and is out of her epi-pen. She has not had any recent flare-ups.  At risk for cardiovascular disease Rebekah [name] is at a higher than average risk for cardiovascular disease due to obesity.  ALLERGIES: Allergies  Allergen Reactions  . Beef-Derived Products Anaphylaxis    After tick bite, cannot eat beef, pork or lamb  . Lambs Quarters Anaphylaxis    After tick bite cannot eat beef, pork or lamb  . Mucinex [Guaifenesin Er] Anaphylaxis  . Pork-Derived Products Anaphylaxis    After tick bite cannot eat beef, pork or lamb  . Darvon [Propoxyphene] Other (See Comments)    Hallucinations   . Ace Inhibitors Swelling and Cough    Pedal Edema  . Hydromet [Hydrocodone-Homatropine] Itching and Other (See Comments)    Severe stomach pain-face itches.   . Sulfonamide Derivatives Rash    MEDICATIONS: Current Outpatient Medications on File Prior to Visit  Medication Sig Dispense Refill  . acetaminophen (TYLENOL) 500 MG tablet Take 500-1,000 mg by mouth every 6 (six) hours as needed for moderate pain or headache.    Marland Kitchen azithromycin (ZITHROMAX Z-PAK) 250 MG tablet Take as directed 6 each 0  . CALCIUM PO Take 500 mg by mouth daily.     Marland Kitchen CARTIA XT 120 MG 24 hr capsule  TAKE 1 CAPSULE BY MOUTH ONCE DAILY 90 capsule 1  . cetirizine (ZYRTEC) 10 MG tablet Take 10 mg by mouth at bedtime.     Marland Kitchen desmopressin (DDAVP) 0.2 MG tablet Take 0.6 mg by mouth at bedtime.    Marland Kitchen diltiazem (CARDIZEM) 30 MG tablet TAKE ONE TO TWO TABLETS BY MOUTH EVERY 6 HOURS AS NEEDED FOR  FAST  HEART  RATES 90 tablet 0  . diphenhydrAMINE (BENADRYL) 50 MG tablet Take 50 mg by mouth at bedtime as needed for itching.    . escitalopram (LEXAPRO) 10 MG tablet TAKE 1 TABLET BY MOUTH ONCE DAILY 90 tablet 3  . flecainide (TAMBOCOR) 100 MG tablet Take 3 tablets by mouth at onset of fast heart rate as needed. 270 tablet 0  . furosemide (LASIX) 20 MG tablet Take 1 tablet (20 mg total) by mouth daily as needed for edema. (Patient taking differently: Take 10 mg by mouth daily at 2 PM. Take 10mg  daily at 2pm) 30 tablet 3  . levothyroxine (SYNTHROID, LEVOTHROID) 50 MCG tablet TAKE 1 TABLET BY MOUTH ONCE DAILY BEFORE BREAKFAST 90 tablet 2  . losartan (COZAAR) 50 MG tablet Take 1 tablet (50 mg total) by mouth 2 (two) times daily. 180 tablet 3  . Multiple Vitamins-Minerals (MULTIVITAMIN ADULT PO) Take 1 capsule by mouth 2 (two) times daily. Journey 3+3 Bariatric Multivitamin    . oxybutynin (DITROPAN-XL) 10 MG  24 hr tablet Take 10 mg by mouth at bedtime.    . OXYGEN Inhale 3 L/min into the lungs at bedtime. Continuously    . predniSONE (STERAPRED UNI-PAK 21 TAB) 10 MG (21) TBPK tablet As directed x 6 days 21 tablet 0  . traMADol (ULTRAM) 50 MG tablet Take 1 tablet (50 mg total) by mouth every 6 (six) hours as needed. 30 tablet 5  . Vitamin D, Ergocalciferol, (DRISDOL) 50000 units CAPS capsule Take 1 capsule (50,000 Units total) by mouth every 7 (seven) days. 4 capsule 0  . XARELTO 20 MG TABS tablet TAKE 1 TABLET BY MOUTH ONCE DAILY WITH SUPPER 90 tablet 1   No current facility-administered medications on file prior to visit.     PAST MEDICAL HISTORY: Past Medical History:  Diagnosis Date  . Anxiety   .  Arthritis   . Bilateral bunions   . Complication of anesthesia   . Difficulty sleeping    prior sleep study did not reveal sleep apnea per patient  . DJD (degenerative joint disease)   . Dyspnea   . Dysrhythmia    a-fib  . Environmental allergies   . Food allergy   . Gallbladder problem   . GERD (gastroesophageal reflux disease)   . Heart murmur   . Hypertension    no meds after weight loss  . Hypothyroidism   . Incontinence of urine    at nite   . Joint pain   . Left atrial dilatation   . Left foot pain   . Leg edema   . Obesity    s/p gastric sleeve 12/2013 (previously weighed close to 400 lbs)  . Osteoarthritis   . Palpitations   . Paroxysmal atrial fibrillation (HCC)   . Pneumonia    hx of   . PONV (postoperative nausea and vomiting)   . Status post bilateral knee replacements   . Supplemental oxygen dependent    uses 2l/[place] at night, states HR goes low and O2 drops  . Tuberculosis    had 6 month of INH due to exposure   . Vaginal vault prolapse     PAST SURGICAL HISTORY: Past Surgical History:  Procedure Laterality Date  . APPENDECTOMY    . CHOLECYSTECTOMY    . EYE SURGERY     03/2014 lens implant left lens   . FOOT ARTHRODESIS Left 03/15/2016   Procedure: TALONAVICULAR AND SUBTALAR ARTHRODESIS;  Surgeon: Wylene Simmer, MD;  Location: [facility];  Service: Orthopedics;  Laterality: Left;  Marland Kitchen GASTROC RECESSION EXTREMITY Left 03/15/2016   Procedure: LEFT GASTROC RECESSION;  Surgeon: Wylene Simmer, MD;  Location: [facility];  Service: Orthopedics;  Laterality: Left;  . JOINT REPLACEMENT  1999   L TOTAL KNEE  . LAPAROSCOPIC GASTRIC SLEEVE RESECTION N/A 01/04/2014   Procedure: LAPAROSCOPIC GASTRIC SLEEVE RESECTION;  Surgeon: Greer Pickerel, MD;  Location: [facility];  Service: General;  Laterality: N/A;  . TONSILLECTOMY    . TOTAL KNEE ARTHROPLASTY Right 05/17/2015   Procedure: RIGHT TOTAL KNEE ARTHROPLASTY;  Surgeon: Paralee Cancel, MD;  Location:  [facility];  Service: Orthopedics;  Laterality: Right;  . TRANSTHORACIC ECHOCARDIOGRAM  11/2012   EF 60-65%, mod LVH & mod conc hypertrophy, grade 1 diastolic dysfunction; LA mildly dilated; RV systolic pressure increased; PA peak pressure 53mmHg  . TUBAL LIGATION    . UPPER GI ENDOSCOPY  01/04/2014   Procedure: UPPER GI ENDOSCOPY;  Surgeon: Greer Pickerel, MD;  Location: [facility];  Service: General;;  SOCIAL HISTORY: Social History   Tobacco Use  . Smoking status: Never Smoker  . Smokeless tobacco: Never Used  Substance Use Topics  . Alcohol use: No  . Drug use: No    FAMILY HISTORY: Family History  Problem Relation Age of Onset  . Diabetes Father   . Hyperlipidemia Father   . Hypertension Father   . Heart disease Father   . Sudden death Father   . Anxiety disorder Father   . Obesity Father   . Uterine cancer Mother   . Cervical cancer Mother   . Breast cancer Mother   . Cancer Mother        cervical and brreast cancer  . Hypertension Mother   . Hyperlipidemia Mother   . Obesity Mother   . Diabetes Maternal Grandmother     ROS: Review of Systems  Constitutional: Positive for weight loss.    PHYSICAL EXAM: Blood pressure 116/76, pulse 70, temperature 98.4 F (36.9 C), temperature source Oral, height 5\' 1"  (1.549 m), weight 248 lb (112.5 kg), SpO2 97 %. Body mass index is 46.86 kg/m. Physical Exam  Constitutional: She is oriented to person, place, and time. She appears well-developed and well-nourished.  Cardiovascular: Normal rate.  Pulmonary/Chest: Effort normal.  Musculoskeletal: Normal range of motion.  Neurological: She is oriented to person, place, and time.  Skin: Skin is warm and dry.  Psychiatric: She has a normal mood and affect. Her behavior is normal.  Vitals reviewed.   RECENT LABS AND TESTS: BMET    Component Value Date/Time   NA 141 12/02/2017 1016   K 4.5 12/02/2017 1016   CL 99 12/02/2017 1016   CO2 24 12/02/2017 1016   GLUCOSE 91 12/02/2017  1016   GLUCOSE 141 (H) 05/18/2015 0456   BUN 25 12/02/2017 1016   CREATININE 0.87 12/02/2017 1016   CALCIUM 10.3 12/02/2017 1016   GFRNONAA 71 12/02/2017 1016   GFRAA 82 12/02/2017 1016   Lab Results  Component Value Date   HGBA1C 5.7 (H) 12/02/2017   HGBA1C 5.5 05/02/2017   HGBA1C 5.9 (H) 01/01/2017   HGBA1C 6.1 (H) 07/02/2013   HGBA1C (H) 09/24/2006    6.9 (NOTE)   The ADA recommends the following therapeutic goals for glycemic   control related to Hgb A1C measurement:   Goal of Therapy:   < 7.0% Hgb A1C   Action Suggested:  > 8.0% Hgb A1C   Ref:  Diabetes Care, 22, Suppl. 1, 1999   Lab Results  Component Value Date   INSULIN 9.2 12/02/2017   INSULIN 7.9 05/02/2017   INSULIN 9.8 01/01/2017   CBC    Component Value Date/Time   WBC 7.6 01/01/2017 1335   WBC 8.0 05/18/2015 0456   RBC 4.91 01/01/2017 1335   RBC 3.29 (L) 05/18/2015 0456   HGB 13.6 01/01/2017 1335   HCT 42.6 01/01/2017 1335   PLT 117 (L) 05/18/2015 0456   MCV 87 01/01/2017 1335   MCH 27.7 01/01/2017 1335   MCH 29.2 05/18/2015 0456   MCHC 31.9 01/01/2017 1335   MCHC 32.3 05/18/2015 0456   RDW 14.7 01/01/2017 1335   LYMPHSABS 1.9 01/01/2017 1335   MONOABS 0.8 03/26/2015 0316   EOSABS 0.2 01/01/2017 1335   BASOSABS 0.1 01/01/2017 1335   Iron/TIBC/Ferritin/ %Sat No results found for: IRON, TIBC, FERRITIN, IRONPCTSAT Lipid Panel     Component Value Date/Time   CHOL 195 12/02/2017 1016   TRIG 87 12/02/2017 1016   HDL 67 12/02/2017 1016  CHOLHDL 4.2 07/02/2013 1301   VLDL 43 (H) 07/02/2013 1301   LDLCALC 111 (H) 12/02/2017 1016   Hepatic Function Panel     Component Value Date/Time   PROT 8.1 12/02/2017 1016   ALBUMIN 4.6 12/02/2017 1016   AST 23 12/02/2017 1016   ALT 20 12/02/2017 1016   ALKPHOS 79 12/02/2017 1016   BILITOT 0.5 12/02/2017 1016      Component Value Date/Time   TSH 3.090 05/02/2017 0850   TSH 2.350 01/01/2017 1335   TSH 1.523 07/20/2014 1546   Results for Kreider, KAMAILE ZACHOW  (MRN [id number]) as of 01/16/2018 16:59  Ref. Range 12/24/2017 12:35  Vitamin D, 25-Hydroxy Latest Ref Range: 30.0 - 100.0 ng/mL 45.6    ASSESSMENT AND PLAN: Anaphylaxis, initial encounter - Plan: EPINEPHrine (EPIPEN) 0.3 mg/0.3 mL IJ SOAJ injection  At risk for heart disease  Class 3 severe obesity with serious comorbidity and body mass index (BMI) of 45.0 to 49.9 in adult, unspecified obesity type (HCC)  PLAN:  Anaphylaxis We will refill her epi-pen x 6 months with no refills.  Cardiovascular risk counseling Trannie was given extended (15 minutes) coronary artery disease prevention counseling today. She is 62 y.o. female and has risk factors for heart disease including obesity. We discussed intensive lifestyle modifications today with an emphasis on specific weight loss instructions and strategies. Pt was also informed of the importance of increasing exercise and decreasing saturated fats to help prevent heart disease.  Obesity Rebekah [name] is currently in the action stage of change. As such, her goal is to continue with weight loss efforts. She has agreed to [employer]. Rebekah [name] has been instructed to work up to a goal of 150 minutes of combined cardio and strengthening exercise per week for weight loss and overall health benefits. We discussed the following Behavioral Modification Strategies today: increasing lean protein intake, decreasing simple carbohydrates, increase H2O intake, and work on meal planning and easy cooking plans.  Rebekah [name] has agreed to follow up with our clinic in 3 to 4 weeks. She was informed of the importance of frequent follow up visits to maximize her success with intensive lifestyle modifications for her multiple health conditions.   OBESITY BEHAVIORAL INTERVENTION VISIT  Today's visit was # 17   Starting weight: 267 lbs Starting date: 01/01/17 Today's weight : Weight: 248 lb (112.5 kg)  Today's date: 01/16/2018 Total lbs lost to date: 25  ASK: We discussed the  diagnosis of obesity with Rebekah [name] today and Rebekah [name] agreed to give [name] permission to discuss obesity behavioral modification therapy today.  ASSESS: Rebekah [name] has the diagnosis of obesity and her BMI today is 46.88. Rebekah [name] is in the action stage of change.   ADVISE: Rebekah [name] was educated on the multiple health risks of obesity as well as the benefit of weight loss to improve her health. She was advised of the need for long term treatment and the importance of lifestyle modifications to improve her current health and to decrease her risk of future health problems.  AGREE: Multiple dietary modification options and treatment options were discussed and Rebekah [name] agreed to follow the recommendations documented in the above note.  ARRANGE: Rebekah [name] was educated on the importance of frequent visits to treat obesity as outlined per CMS and USPSTF guidelines and agreed to schedule her next follow up appointment today.  I, Marcille Blanco, am acting as transcriptionist for Starlyn Skeans, MD  I have reviewed the above documentation for accuracy and completeness, and I agree with the  above. -Dennard Nip, MD

## 2018-02-11 ENCOUNTER — Ambulatory Visit (INDEPENDENT_AMBULATORY_CARE_PROVIDER_SITE_OTHER): Payer: BC Managed Care – PPO | Admitting: Family Medicine

## 2018-02-11 VITALS — BP 122/79 | HR 78 | Temp 98.2°F | Ht 61.0 in | Wt 245.0 lb

## 2018-02-11 DIAGNOSIS — Z6841 Body Mass Index (BMI) 40.0 and over, adult: Secondary | ICD-10-CM

## 2018-02-11 DIAGNOSIS — E559 Vitamin D deficiency, unspecified: Secondary | ICD-10-CM

## 2018-02-11 DIAGNOSIS — E66813 Obesity, class 3: Secondary | ICD-10-CM

## 2018-02-11 DIAGNOSIS — Z9189 Other specified personal risk factors, not elsewhere classified: Secondary | ICD-10-CM

## 2018-02-11 MED ORDER — VITAMIN D (ERGOCALCIFEROL) 1.25 MG (50000 UNIT) PO CAPS: 50000.0000 [IU] | ORAL_CAPSULE | ORAL | 0 refills | Status: DC

## 2018-02-11 NOTE — Progress Notes (Signed)
Office: [phone]  /  Fax: [phone]   HPI:   Chief Complaint: OBESITY Rebekah [name] is here to discuss her progress with her obesity treatment plan. She is on the  Follow the Akins eating plan and is following her eating plan approximately 100 % of the time. 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 She is using sugar free candy and Atkins bars. She is trying to keep her net carbs below 20 an average.   Her weight is 245 lb (111.1 kg) today and has had a weight loss of 3 pounds over a period of 2 weeks since her last visit. She has lost 12 lbs since starting treatment with [name].  Vitamin D deficiency [name] has a diagnosis of vitamin D deficiency. She is currently taking vit D and denies nausea, vomiting or muscle weakness.  At risk for osteopenia and osteoporosis [name] is at higher risk of osteopenia and osteoporosis due to vitamin D deficiency.    ALLERGIES: Allergies  Allergen Reactions  . Beef-Derived Products Anaphylaxis    After tick bite, cannot eat beef, pork or lamb  . Lambs Quarters Anaphylaxis    After tick bite cannot eat beef, pork or lamb  . Mucinex [Guaifenesin Er] Anaphylaxis  . Pork-Derived Products Anaphylaxis    After tick bite cannot eat beef, pork or lamb  . Darvon [Propoxyphene] Other (See Comments)    Hallucinations   . Ace Inhibitors Swelling and Cough    Pedal Edema  . Hydromet [Hydrocodone-Homatropine] Itching and Other (See Comments)    Severe stomach pain-face itches.   . Sulfonamide Derivatives Rash    MEDICATIONS: Current Outpatient Medications on File Prior to Visit  Medication Sig Dispense Refill  . acetaminophen (TYLENOL) 500 MG tablet Take 500-1,000 mg by mouth every 6 (six) hours as needed for moderate pain or headache.    Marland Kitchen azithromycin (ZITHROMAX Z-PAK) 250 MG tablet Take as directed 6 each 0  . CALCIUM PO Take 500 mg by mouth daily.     Marland Kitchen CARTIA XT 120 MG 24 hr  capsule TAKE 1 CAPSULE BY MOUTH ONCE DAILY 90 capsule 1  . cetirizine (ZYRTEC) 10 MG tablet Take 10 mg by mouth at bedtime.     Marland Kitchen desmopressin (DDAVP) 0.2 MG tablet Take 0.6 mg by mouth at bedtime.    Marland Kitchen diltiazem (CARDIZEM) 30 MG tablet TAKE ONE TO TWO TABLETS BY MOUTH EVERY 6 HOURS AS NEEDED FOR  FAST  HEART  RATES 90 tablet 0  . diphenhydrAMINE (BENADRYL) 50 MG tablet Take 50 mg by mouth at bedtime as needed for itching.    Marland Kitchen EPINEPHrine (EPIPEN) 0.3 mg/0.3 mL IJ SOAJ injection Inject 0.3 mLs (0.3 mg total) into the muscle once as needed (anaphylaxis). 1 Device 6  . escitalopram (LEXAPRO) 10 MG tablet TAKE 1 TABLET BY MOUTH ONCE DAILY 90 tablet 3  . flecainide (TAMBOCOR) 100 MG tablet Take 3 tablets by mouth at onset of fast heart rate as needed. 270 tablet 0  . furosemide (LASIX) 20 MG tablet Take 1 tablet (20 mg total) by mouth daily as needed for edema. (Patient taking differently: Take 10 mg by mouth daily at 2 PM. Take 10mg  daily at 2pm) 30 tablet 3  . levothyroxine (SYNTHROID, LEVOTHROID) 50 MCG tablet TAKE 1 TABLET BY MOUTH ONCE DAILY BEFORE BREAKFAST 90 tablet 2  . losartan (COZAAR) 50 MG tablet Take 1 tablet (50 mg total) by  mouth 2 (two) times daily. 180 tablet 3  . Multiple Vitamins-Minerals (MULTIVITAMIN ADULT PO) Take 1 capsule by mouth 2 (two) times daily. Journey 3+3 Bariatric Multivitamin    . oxybutynin (DITROPAN-XL) 10 MG 24 hr tablet Take 10 mg by mouth at bedtime.    . OXYGEN Inhale 3 L/min into the lungs at bedtime. Continuously    . predniSONE (STERAPRED UNI-PAK 21 TAB) 10 MG (21) TBPK tablet As directed x 6 days 21 tablet 0  . traMADol (ULTRAM) 50 MG tablet Take 1 tablet (50 mg total) by mouth every 6 (six) hours as needed. 30 tablet 5  . XARELTO 20 MG TABS tablet TAKE 1 TABLET BY MOUTH ONCE DAILY WITH SUPPER 90 tablet 1   No current facility-administered medications on file prior to visit.     PAST MEDICAL HISTORY: Past Medical History:  Diagnosis Date  . Anxiety     . Arthritis   . Bilateral bunions   . Complication of anesthesia   . Difficulty sleeping    prior sleep study did not reveal sleep apnea per patient  . DJD (degenerative joint disease)   . Dyspnea   . Dysrhythmia    a-fib  . Environmental allergies   . Food allergy   . Gallbladder problem   . GERD (gastroesophageal reflux disease)   . Heart murmur   . Hypertension    no meds after weight loss  . Hypothyroidism   . Incontinence of urine    at nite   . Joint pain   . Left atrial dilatation   . Left foot pain   . Leg edema   . Obesity    s/p gastric sleeve 12/2013 (previously weighed close to 400 lbs)  . Osteoarthritis   . Palpitations   . Paroxysmal atrial fibrillation (HCC)   . Pneumonia    hx of   . PONV (postoperative nausea and vomiting)   . Status post bilateral knee replacements   . Supplemental oxygen dependent    uses 2l/[place] at night, states HR goes low and O2 drops  . Tuberculosis    had 6 month of INH due to exposure   . Vaginal vault prolapse     PAST SURGICAL HISTORY: Past Surgical History:  Procedure Laterality Date  . APPENDECTOMY    . CHOLECYSTECTOMY    . EYE SURGERY     03/2014 lens implant left lens   . FOOT ARTHRODESIS Left 03/15/2016   Procedure: TALONAVICULAR AND SUBTALAR ARTHRODESIS;  Surgeon: Wylene Simmer, MD;  Location: [facility];  Service: Orthopedics;  Laterality: Left;  Marland Kitchen GASTROC RECESSION EXTREMITY Left 03/15/2016   Procedure: LEFT GASTROC RECESSION;  Surgeon: Wylene Simmer, MD;  Location: [facility];  Service: Orthopedics;  Laterality: Left;  . JOINT REPLACEMENT  1999   L TOTAL KNEE  . LAPAROSCOPIC GASTRIC SLEEVE RESECTION N/A 01/04/2014   Procedure: LAPAROSCOPIC GASTRIC SLEEVE RESECTION;  Surgeon: Greer Pickerel, MD;  Location: [facility];  Service: General;  Laterality: N/A;  . TONSILLECTOMY    . TOTAL KNEE ARTHROPLASTY Right 05/17/2015   Procedure: RIGHT TOTAL KNEE ARTHROPLASTY;  Surgeon: Paralee Cancel, MD;   Location: [facility];  Service: Orthopedics;  Laterality: Right;  . TRANSTHORACIC ECHOCARDIOGRAM  11/2012   EF 60-65%, mod LVH & mod conc hypertrophy, grade 1 diastolic dysfunction; LA mildly dilated; RV systolic pressure increased; PA peak pressure 17mmHg  . TUBAL LIGATION    . UPPER GI ENDOSCOPY  01/04/2014   Procedure: UPPER GI ENDOSCOPY;  Surgeon: Greer Pickerel, MD;  Location: [facility];  Service: General;;    SOCIAL HISTORY: Social History   Tobacco Use  . Smoking status: Never Smoker  . Smokeless tobacco: Never Used  Substance Use Topics  . Alcohol use: No  . Drug use: No    FAMILY HISTORY: Family History  Problem Relation Age of Onset  . Diabetes Father   . Hyperlipidemia Father   . Hypertension Father   . Heart disease Father   . Sudden death Father   . Anxiety disorder Father   . Obesity Father   . Uterine cancer Mother   . Cervical cancer Mother   . Breast cancer Mother   . Cancer Mother        cervical and brreast cancer  . Hypertension Mother   . Hyperlipidemia Mother   . Obesity Mother   . Diabetes Maternal Grandmother     ROS: Review of Systems  Constitutional: Positive for weight loss.  All other systems reviewed and are negative.   PHYSICAL EXAM: Blood pressure 122/79, pulse 78, temperature 98.2 F (36.8 C), temperature source Oral, height 5\' 1"  (1.549 m), weight 245 lb (111.1 kg), SpO2 95 %. Body mass index is 46.29 kg/m. Physical Exam  Constitutional: She is oriented to person, place, and time. She appears well-developed and well-nourished.  HENT:  Head: Normocephalic.  Eyes: Pupils are equal, round, and reactive to light.  Neck: Normal range of motion.  Pulmonary/Chest: Effort normal.  Musculoskeletal: Normal range of motion.  Neurological: She is alert and oriented to person, place, and time.  Skin: Skin is warm and dry.  Psychiatric: She has a normal mood and affect. Her behavior is normal.  Vitals reviewed.   RECENT LABS AND TESTS: BMET      Component Value Date/Time   NA 141 12/02/2017 1016   K 4.5 12/02/2017 1016   CL 99 12/02/2017 1016   CO2 24 12/02/2017 1016   GLUCOSE 91 12/02/2017 1016   GLUCOSE 141 (H) 05/18/2015 0456   BUN 25 12/02/2017 1016   CREATININE 0.87 12/02/2017 1016   CALCIUM 10.3 12/02/2017 1016   GFRNONAA 71 12/02/2017 1016   GFRAA 82 12/02/2017 1016   Lab Results  Component Value Date   HGBA1C 5.7 (H) 12/02/2017   HGBA1C 5.5 05/02/2017   HGBA1C 5.9 (H) 01/01/2017   HGBA1C 6.1 (H) 07/02/2013   HGBA1C (H) 09/24/2006    6.9 (NOTE)   The ADA recommends the following therapeutic goals for glycemic   control related to Hgb A1C measurement:   Goal of Therapy:   < 7.0% Hgb A1C   Action Suggested:  > 8.0% Hgb A1C   Ref:  Diabetes Care, 22, Suppl. 1, 1999   Lab Results  Component Value Date   INSULIN 9.2 12/02/2017   INSULIN 7.9 05/02/2017   INSULIN 9.8 01/01/2017   CBC    Component Value Date/Time   WBC 7.6 01/01/2017 1335   WBC 8.0 05/18/2015 0456   RBC 4.91 01/01/2017 1335   RBC 3.29 (L) 05/18/2015 0456   HGB 13.6 01/01/2017 1335   HCT 42.6 01/01/2017 1335   PLT 117 (L) 05/18/2015 0456   MCV 87 01/01/2017 1335   MCH 27.7 01/01/2017 1335   MCH 29.2 05/18/2015 0456   MCHC 31.9 01/01/2017 1335   MCHC 32.3 05/18/2015 0456   RDW 14.7 01/01/2017 1335   LYMPHSABS 1.9 01/01/2017 1335   MONOABS 0.8 03/26/2015 0316   EOSABS 0.2 01/01/2017 1335   BASOSABS 0.1 01/01/2017  1335   Iron/TIBC/Ferritin/ %Sat No results found for: IRON, TIBC, FERRITIN, IRONPCTSAT Lipid Panel     Component Value Date/Time   CHOL 195 12/02/2017 1016   TRIG 87 12/02/2017 1016   HDL 67 12/02/2017 1016   CHOLHDL 4.2 07/02/2013 1301   VLDL 43 (H) 07/02/2013 1301   LDLCALC 111 (H) 12/02/2017 1016   Hepatic Function Panel     Component Value Date/Time   PROT 8.1 12/02/2017 1016   ALBUMIN 4.6 12/02/2017 1016   AST 23 12/02/2017 1016   ALT 20 12/02/2017 1016   ALKPHOS 79 12/02/2017 1016   BILITOT 0.5 12/02/2017  1016      Component Value Date/Time   TSH 3.090 05/02/2017 0850   TSH 2.350 01/01/2017 1335   TSH 1.523 07/20/2014 1546    ASSESSMENT AND PLAN: Vitamin D deficiency - Plan: Vitamin D, Ergocalciferol, (DRISDOL) 50000 units CAPS capsule  At risk for osteoporosis  Class 3 severe obesity with serious comorbidity and body mass index (BMI) of 45.0 to 49.9 in adult, unspecified obesity type ([place])  PLAN: Vitamin D Deficiency Afrah was informed that low vitamin D levels contributes to fatigue and are associated with obesity, breast, and colon cancer. She agrees to continue to take prescription Vit D @50 ,000 IU every week in which a 30 day supply was written and will follow up for routine testing of vitamin D, at least 2-3 times per year. She was informed of the risk of over-replacement of vitamin D and agrees to not increase her dose unless she discusses this with [name] first.  Obesity Dail is currently in the action stage of change. As such, her goal is to continue with weight loss efforts She has agreed to follow the Atkins diet.  Rei has been instructed to work up to a goal of 150 minutes of combined cardio and strengthening exercise per week for weight loss and overall health benefits. We discussed the following Behavioral Modification Stratagies today: increasing lean protein intake and decreasing simple carbohydrates    Brecklynn has agreed to follow up with our clinic in 4 weeks. She was informed of the importance of frequent follow up visits to maximize her success with intensive lifestyle modifications for her multiple health conditions.   OBESITY BEHAVIORAL INTERVENTION VISIT  Today's visit was # 17   Starting weight: 267 lbs Starting date: 01/01/17 Today's weight : Weight: 245 lb (111.1 kg)  Today's date: 02/11/2018 Total lbs lost to date: 15    ASK: We discussed the diagnosis of obesity with [name] today and Merrily agreed to give [name] permission to discuss obesity behavioral  modification therapy today.  ASSESS: Dezi has the diagnosis of obesity and her BMI today is 73.3 Justyne is in the action stage of change   ADVISE: Nawaal was educated on the multiple health risks of obesity as well as the benefit of weight loss to improve her health. She was advised of the need for long term treatment and the importance of lifestyle modifications to improve her current health and to decrease her risk of future health problems.  AGREE: Multiple dietary modification options and treatment options were discussed and  Delayni agreed to follow the recommendations documented in the above note.  ARRANGE: Lenea was educated on the importance of frequent visits to treat obesity as outlined per CMS and USPSTF guidelines and agreed to schedule her next follow up appointment today.  I, April Moore , am acting as Location manager for Dr Dennard Nip.   I  have reviewed the above documentation for accuracy and completeness, and I agree with the above. -Dennard Nip, MD

## 2018-02-12 ENCOUNTER — Encounter (INDEPENDENT_AMBULATORY_CARE_PROVIDER_SITE_OTHER): Payer: Self-pay | Admitting: Family Medicine

## 2018-03-03 ENCOUNTER — Ambulatory Visit (INDEPENDENT_AMBULATORY_CARE_PROVIDER_SITE_OTHER): Payer: BC Managed Care – PPO | Admitting: Family Medicine

## 2018-03-04 ENCOUNTER — Other Ambulatory Visit: Payer: Self-pay | Admitting: Physician Assistant

## 2018-03-04 ENCOUNTER — Encounter: Payer: Self-pay | Admitting: Physician Assistant

## 2018-03-04 MED ORDER — DOXYCYCLINE HYCLATE 100 MG PO TABS: 100.0000 mg | ORAL_TABLET | Freq: Two times a day (BID) | ORAL | 0 refills | Status: DC

## 2018-03-04 MED ORDER — PREDNISONE 10 MG (21) PO TBPK: ORAL_TABLET | ORAL | 0 refills | Status: DC

## 2018-03-06 ENCOUNTER — Ambulatory Visit (INDEPENDENT_AMBULATORY_CARE_PROVIDER_SITE_OTHER): Payer: BC Managed Care – PPO | Admitting: Family Medicine

## 2018-03-06 VITALS — BP 126/78 | HR 74 | Ht 61.0 in | Wt 235.0 lb

## 2018-03-06 DIAGNOSIS — Z9189 Other specified personal risk factors, not elsewhere classified: Secondary | ICD-10-CM

## 2018-03-06 DIAGNOSIS — E038 Other specified hypothyroidism: Secondary | ICD-10-CM | POA: Diagnosis not present

## 2018-03-06 DIAGNOSIS — Z6841 Body Mass Index (BMI) 40.0 and over, adult: Secondary | ICD-10-CM

## 2018-03-06 DIAGNOSIS — R7303 Prediabetes: Secondary | ICD-10-CM

## 2018-03-06 DIAGNOSIS — E7849 Other hyperlipidemia: Secondary | ICD-10-CM

## 2018-03-06 DIAGNOSIS — E559 Vitamin D deficiency, unspecified: Secondary | ICD-10-CM

## 2018-03-06 MED ORDER — VITAMIN D (ERGOCALCIFEROL) 1.25 MG (50000 UNIT) PO CAPS: 50000.0000 [IU] | ORAL_CAPSULE | ORAL | 0 refills | Status: DC

## 2018-03-07 LAB — LIPID PANEL
Chol/HDL Ratio: 2.8 ratio (ref 0.0–4.4)
Cholesterol, Total: 179 mg/dL (ref 100–199)
HDL: 64 mg/dL (ref >39)
LDL Calculated: 98 mg/dL (ref 0–99)
Triglycerides: 84 mg/dL (ref 0–149)
VLDL Cholesterol Cal: 17 mg/dL (ref 5–40)

## 2018-03-07 LAB — COMPREHENSIVE METABOLIC PANEL
ALT: 17 IU/L (ref 0–32)
AST: 19 IU/L (ref 0–40)
Albumin/Globulin Ratio: 1.5 (ref 1.2–2.2)
Albumin: 4.6 g/dL (ref 3.6–4.8)
Alkaline Phosphatase: 85 IU/L (ref 39–117)
BUN/Creatinine Ratio: 19 (ref 12–28)
BUN: 16 mg/dL (ref 8–27)
Bilirubin Total: 0.5 mg/dL (ref 0.0–1.2)
CO2: 25 mmol/L (ref 20–29)
Calcium: 10.5 mg/dL — ABNORMAL HIGH (ref 8.7–10.3)
Chloride: 100 mmol/L (ref 96–106)
Creatinine, Ser: 0.85 mg/dL (ref 0.57–1.00)
GFR calc Af Amer: 84 mL/min/{1.73_m2} (ref >59)
GFR calc non Af Amer: 73 mL/min/{1.73_m2} (ref >59)
Globulin, Total: 3 g/dL (ref 1.5–4.5)
Glucose: 78 mg/dL (ref 65–99)
Potassium: 4.6 mmol/L (ref 3.5–5.2)
Sodium: 144 mmol/L (ref 134–144)
Total Protein: 7.6 g/dL (ref 6.0–8.5)

## 2018-03-07 LAB — TSH: TSH: 3.58 u[IU]/mL (ref 0.450–4.500)

## 2018-03-07 LAB — T4, FREE: Free T4: 1.3 ng/dL (ref 0.82–1.77)

## 2018-03-07 LAB — HEMOGLOBIN A1C
Est. average glucose Bld gHb Est-mCnc: 111 mg/dL
Hgb A1c MFr Bld: 5.5 % (ref 4.8–5.6)

## 2018-03-07 LAB — T3: T3, Total: 94 ng/dL (ref 71–180)

## 2018-03-07 LAB — VITAMIN D 25 HYDROXY (VIT D DEFICIENCY, FRACTURES): VIT D 25 HYDROXY: 50.9 ng/mL (ref 30.0–100.0)

## 2018-03-07 LAB — INSULIN, RANDOM: INSULIN: 6.5 u[IU]/mL (ref 2.6–24.9)

## 2018-03-11 NOTE — Progress Notes (Signed)
Office: [phone]  /  Fax: [phone]   HPI:   Chief Complaint: OBESITY Rebekah [name] is here to discuss her progress with her obesity treatment plan. She is on the Atkins diet and is following her eating plan approximately 100 % of the time. She states she is using resistance bands and doing wall push ups for 30 minutes 5-7 times per week. Rebekah [name] continues to do well on the [employer]. She has made her plans for Thanksgiving. She is status post gastric sleeve.  Her weight is 235 lb (106.6 kg) today and has had a weight loss of 10 pounds over a period of 3 weeks since her last visit. She has lost 32 lbs since starting treatment with [name].  Vitamin D Deficiency Rebekah [name] has a diagnosis of vitamin D deficiency. She is stable on prescription Vit D and denies nausea, vomiting or muscle weakness.  Pre-Diabetes Rebekah [name] has a diagnosis of pre-diabetes based on her elevated Hgb A1c and was informed this puts her at greater risk of developing diabetes. She is not taking metformin currently and attempting to control with diet and exercise to decrease risk of diabetes. She is due for labs and she denies nausea or hypoglycemia.  At risk for diabetes Rebekah [name] is at higher than average risk for developing diabetes due to her obesity and pre-diabetes. She currently denies polyuria or polydipsia.  Hypothyroidism Rebekah [name] has a diagnosis of hypothyroidism. She is on Synthroid and she is due for labs. She denies hot or cold intolerance or palpitations.  Hyperlipidemia Rebekah [name] has hyperlipidemia and has been attempting to control her cholesterol levels with intensive lifestyle modification including a low saturated fat diet, exercise and weight loss. She is not on statin and denies any chest pain, claudication or myalgias.  ALLERGIES: Allergies  Allergen Reactions  . Beef-Derived Products Anaphylaxis    After tick bite, cannot eat beef, pork or lamb  . Lambs Quarters Anaphylaxis    After tick bite cannot eat beef, pork or  lamb  . Mucinex [Guaifenesin Er] Anaphylaxis  . Pork-Derived Products Anaphylaxis    After tick bite cannot eat beef, pork or lamb  . Darvon [Propoxyphene] Other (See Comments)    Hallucinations   . Ace Inhibitors Swelling and Cough    Pedal Edema  . Hydromet [Hydrocodone-Homatropine] Itching and Other (See Comments)    Severe stomach pain-face itches.   . Sulfonamide Derivatives Rash    MEDICATIONS: Current Outpatient Medications on File Prior to Visit  Medication Sig Dispense Refill  . acetaminophen (TYLENOL) 500 MG tablet Take 500-1,000 mg by mouth every 6 (six) hours as needed for moderate pain or headache.    Marland Kitchen CALCIUM PO Take 500 mg by mouth daily.     Marland Kitchen CARTIA XT 120 MG 24 hr capsule TAKE 1 CAPSULE BY MOUTH ONCE DAILY 90 capsule 1  . cetirizine (ZYRTEC) 10 MG tablet Take 10 mg by mouth at bedtime.     Marland Kitchen desmopressin (DDAVP) 0.2 MG tablet Take 0.6 mg by mouth at bedtime.    Marland Kitchen diltiazem (CARDIZEM) 30 MG tablet TAKE ONE TO TWO TABLETS BY MOUTH EVERY 6 HOURS AS NEEDED FOR  FAST  HEART  RATES 90 tablet 0  . diphenhydrAMINE (BENADRYL) 50 MG tablet Take 50 mg by mouth at bedtime as needed for itching.    Marland Kitchen doxycycline (VIBRA-TABS) 100 MG tablet Take 1 tablet (100 mg total) by mouth 2 (two) times daily. 1 po bid 20 tablet 0  . EPINEPHrine (EPIPEN) 0.3 mg/0.3 mL IJ  SOAJ injection Inject 0.3 mLs (0.3 mg total) into the muscle once as needed (anaphylaxis). 1 Device 6  . escitalopram (LEXAPRO) 10 MG tablet TAKE 1 TABLET BY MOUTH ONCE DAILY 90 tablet 3  . flecainide (TAMBOCOR) 100 MG tablet Take 3 tablets by mouth at onset of fast heart rate as needed. 270 tablet 0  . furosemide (LASIX) 20 MG tablet Take 1 tablet (20 mg total) by mouth daily as needed for edema. (Patient taking differently: Take 10 mg by mouth daily at 2 PM. Take 10mg  daily at 2pm) 30 tablet 3  . levothyroxine (SYNTHROID, LEVOTHROID) 50 MCG tablet TAKE 1 TABLET BY MOUTH ONCE DAILY BEFORE BREAKFAST 90 tablet 2  . losartan  (COZAAR) 50 MG tablet Take 1 tablet (50 mg total) by mouth 2 (two) times daily. 180 tablet 3  . Multiple Vitamins-Minerals (MULTIVITAMIN ADULT PO) Take 1 capsule by mouth 2 (two) times daily. Journey 3+3 Bariatric Multivitamin    . oxybutynin (DITROPAN-XL) 10 MG 24 hr tablet Take 10 mg by mouth at bedtime.    . OXYGEN Inhale 3 L/min into the lungs at bedtime. Continuously    . predniSONE (STERAPRED UNI-PAK 21 TAB) 10 MG (21) TBPK tablet As directed x 6 days 21 tablet 0  . traMADol (ULTRAM) 50 MG tablet Take 1 tablet (50 mg total) by mouth every 6 (six) hours as needed. 30 tablet 5  . XARELTO 20 MG TABS tablet TAKE 1 TABLET BY MOUTH ONCE DAILY WITH SUPPER 90 tablet 1   No current facility-administered medications on file prior to visit.     PAST MEDICAL HISTORY: Past Medical History:  Diagnosis Date  . Anxiety   . Arthritis   . Bilateral bunions   . Complication of anesthesia   . Difficulty sleeping    prior sleep study did not reveal sleep apnea per patient  . DJD (degenerative joint disease)   . Dyspnea   . Dysrhythmia    a-fib  . Environmental allergies   . Food allergy   . Gallbladder problem   . GERD (gastroesophageal reflux disease)   . Heart murmur   . Hypertension    no meds after weight loss  . Hypothyroidism   . Incontinence of urine    at nite   . Joint pain   . Left atrial dilatation   . Left foot pain   . Leg edema   . Obesity    s/p gastric sleeve 12/2013 (previously weighed close to 400 lbs)  . Osteoarthritis   . Palpitations   . Paroxysmal atrial fibrillation (HCC)   . Pneumonia    hx of   . PONV (postoperative nausea and vomiting)   . Status post bilateral knee replacements   . Supplemental oxygen dependent    uses 2l/[place] at night, states HR goes low and O2 drops  . Tuberculosis    had 6 month of INH due to exposure   . Vaginal vault prolapse     PAST SURGICAL HISTORY: Past Surgical History:  Procedure Laterality Date  . APPENDECTOMY    .  CHOLECYSTECTOMY    . EYE SURGERY     03/2014 lens implant left lens   . FOOT ARTHRODESIS Left 03/15/2016   Procedure: TALONAVICULAR AND SUBTALAR ARTHRODESIS;  Surgeon: Wylene Simmer, MD;  Location: [facility];  Service: Orthopedics;  Laterality: Left;  Marland Kitchen GASTROC RECESSION EXTREMITY Left 03/15/2016   Procedure: LEFT GASTROC RECESSION;  Surgeon: Wylene Simmer, MD;  Location: [facility];  Service: Orthopedics;  Laterality: Left;  . JOINT REPLACEMENT  1999   L TOTAL KNEE  . LAPAROSCOPIC GASTRIC SLEEVE RESECTION N/A 01/04/2014   Procedure: LAPAROSCOPIC GASTRIC SLEEVE RESECTION;  Surgeon: Greer Pickerel, MD;  Location: [facility];  Service: General;  Laterality: N/A;  . TONSILLECTOMY    . TOTAL KNEE ARTHROPLASTY Right 05/17/2015   Procedure: RIGHT TOTAL KNEE ARTHROPLASTY;  Surgeon: Paralee Cancel, MD;  Location: [facility];  Service: Orthopedics;  Laterality: Right;  . TRANSTHORACIC ECHOCARDIOGRAM  11/2012   EF 60-65%, mod LVH & mod conc hypertrophy, grade 1 diastolic dysfunction; LA mildly dilated; RV systolic pressure increased; PA peak pressure 80mmHg  . TUBAL LIGATION    . UPPER GI ENDOSCOPY  01/04/2014   Procedure: UPPER GI ENDOSCOPY;  Surgeon: Greer Pickerel, MD;  Location: [facility];  Service: General;;    SOCIAL HISTORY: Social History   Tobacco Use  . Smoking status: Never Smoker  . Smokeless tobacco: Never Used  Substance Use Topics  . Alcohol use: No  . Drug use: No    FAMILY HISTORY: Family History  Problem Relation Age of Onset  . Diabetes Father   . Hyperlipidemia Father   . Hypertension Father   . Heart disease Father   . Sudden death Father   . Anxiety disorder Father   . Obesity Father   . Uterine cancer Mother   . Cervical cancer Mother   . Breast cancer Mother   . Cancer Mother        cervical and brreast cancer  . Hypertension Mother   . Hyperlipidemia Mother   . Obesity Mother   . Diabetes Maternal Grandmother     ROS: Review of Systems    Constitutional: Positive for weight loss.  Cardiovascular: Negative for chest pain, palpitations and claudication.  Gastrointestinal: Negative for nausea and vomiting.  Genitourinary: Negative for frequency.  Musculoskeletal: Negative for myalgias.       Negative muscle weakness  Endo/Heme/Allergies: Negative for polydipsia.       Negative hypoglycemia Negative hot/cold intolerance    PHYSICAL EXAM: Blood pressure 126/78, pulse 74, height 5\' 1"  (1.549 m), weight 235 lb (106.6 kg), SpO2 99 %. Body mass index is 44.4 kg/m. Physical Exam  Constitutional: She is oriented to person, place, and time. She appears well-developed and well-nourished.  Cardiovascular: Normal rate.  Pulmonary/Chest: Effort normal.  Musculoskeletal: Normal range of motion.  Neurological: She is oriented to person, place, and time.  Skin: Skin is warm and dry.  Psychiatric: She has a normal mood and affect. Her behavior is normal.  Vitals reviewed.   RECENT LABS AND TESTS: BMET    Component Value Date/Time   NA 144 03/06/2018 1319   K 4.6 03/06/2018 1319   CL 100 03/06/2018 1319   CO2 25 03/06/2018 1319   GLUCOSE 78 03/06/2018 1319   GLUCOSE 141 (H) 05/18/2015 0456   BUN 16 03/06/2018 1319   CREATININE 0.85 03/06/2018 1319   CALCIUM 10.5 (H) 03/06/2018 1319   GFRNONAA 73 03/06/2018 1319   GFRAA 84 03/06/2018 1319   Lab Results  Component Value Date   HGBA1C 5.5 03/06/2018   HGBA1C 5.7 (H) 12/02/2017   HGBA1C 5.5 05/02/2017   HGBA1C 5.9 (H) 01/01/2017   HGBA1C 6.1 (H) 07/02/2013   Lab Results  Component Value Date   INSULIN 6.5 03/06/2018   INSULIN 9.2 12/02/2017   INSULIN 7.9 05/02/2017   INSULIN 9.8 01/01/2017   CBC    Component Value Date/Time   WBC  7.6 01/01/2017 1335   WBC 8.0 05/18/2015 0456   RBC 4.91 01/01/2017 1335   RBC 3.29 (L) 05/18/2015 0456   HGB 13.6 01/01/2017 1335   HCT 42.6 01/01/2017 1335   PLT 117 (L) 05/18/2015 0456   MCV 87 01/01/2017 1335   MCH 27.7  01/01/2017 1335   MCH 29.2 05/18/2015 0456   MCHC 31.9 01/01/2017 1335   MCHC 32.3 05/18/2015 0456   RDW 14.7 01/01/2017 1335   LYMPHSABS 1.9 01/01/2017 1335   MONOABS 0.8 03/26/2015 0316   EOSABS 0.2 01/01/2017 1335   BASOSABS 0.1 01/01/2017 1335   Iron/TIBC/Ferritin/ %Sat No results found for: IRON, TIBC, FERRITIN, IRONPCTSAT Lipid Panel     Component Value Date/Time   CHOL 179 03/06/2018 1319   TRIG 84 03/06/2018 1319   HDL 64 03/06/2018 1319   CHOLHDL 2.8 03/06/2018 1319   CHOLHDL 4.2 07/02/2013 1301   VLDL 43 (H) 07/02/2013 1301   LDLCALC 98 03/06/2018 1319   Hepatic Function Panel     Component Value Date/Time   PROT 7.6 03/06/2018 1319   ALBUMIN 4.6 03/06/2018 1319   AST 19 03/06/2018 1319   ALT 17 03/06/2018 1319   ALKPHOS 85 03/06/2018 1319   BILITOT 0.5 03/06/2018 1319      Component Value Date/Time   TSH 3.580 03/06/2018 1319   TSH 3.090 05/02/2017 0850   TSH 2.350 01/01/2017 1335  Results for ADAMSSonita, Michiels (MRN [id number]) as of 03/11/2018 12:58  Ref. Range 12/24/2017 12:35  Vitamin D, 25-Hydroxy Latest Ref Range: 30.0 - 100.0 ng/mL 45.6    ASSESSMENT AND PLAN: Vitamin D deficiency - Plan: Vitamin D, Ergocalciferol, (DRISDOL) 1.25 MG (50000 UT) CAPS capsule, VITAMIN D 25 Hydroxy (Vit-D Deficiency, Fractures)  Prediabetes - Plan: Comprehensive metabolic panel, Hemoglobin A1c, Insulin, random  Other specified hypothyroidism - Plan: T3, T4, free, TSH  Other hyperlipidemia - Plan: Lipid panel  At risk for diabetes mellitus  Class 3 severe obesity with serious comorbidity and body mass index (BMI) of 40.0 to 44.9 in adult, unspecified obesity type (HCC)  PLAN:  Vitamin D Deficiency Cerina was informed that low vitamin D levels contributes to fatigue and are associated with obesity, breast, and colon cancer. Rebekah [name] agrees to continue taking prescription Vit D @50 ,000 IU every week #4 and we will refill for 1 month. She will follow up for routine testing of  vitamin D, at least 2-3 times per year. She was informed of the risk of over-replacement of vitamin D and agrees to not increase her dose unless she discusses this with [name] first. We will check labs and Rebekah [name] agrees to follow up with our clinic in 3 to 4 weeks.  Pre-Diabetes Rebekah [name] will continue to work on weight loss, diet, exercise, and decreasing simple carbohydrates in her diet to help decrease the risk of diabetes. We dicussed metformin including benefits and risks. She was informed that eating too many simple carbohydrates or too many calories at one sitting increases the likelihood of GI side effects. Tazia declined metformin for now and a prescription was not written today. We will check labs and Rebekah [name] agrees to follow up with our clinic in 3 to 4 weeks as directed to monitor her progress.  Diabetes risk counselling Rebekah [name] was given extended (15 minutes) diabetes prevention counseling today. She is 62 y.o. female and has risk factors for diabetes including obesity and pre-diabetes. We discussed intensive lifestyle modifications today with an emphasis on weight loss as well as increasing exercise and decreasing  simple carbohydrates in her diet.  Hypothyroidism Rebekah [name] was informed of the importance of good thyroid control to help with weight loss efforts. She was also informed that supertheraputic thyroid levels are dangerous and will not improve weight loss results. We will check labs and Rebekah [name] agrees to follow up with our clinic in 3 to 4 weeks.  Hyperlipidemia Rebekah [name] was informed of the American Heart Association Guidelines emphasizing intensive lifestyle modifications as the first line treatment for hyperlipidemia. We discussed many lifestyle modifications today in depth, and Rebekah [name] will continue to work on decreasing saturated fats such as fatty red meat, butter and many fried foods. She will also increase vegetables and lean protein in her diet and continue to work on diet, exercise, and weight loss  efforts. We will check labs and Rebekah [name] agrees to follow up with our clinic in 3 to 4 weeks.  Obesity Rebekah [name] is currently in the action stage of change. As such, her goal is to continue with weight loss efforts She has agreed to follow the [place] has been instructed to work up to a goal of 150 minutes of combined cardio and strengthening exercise per week for weight loss and overall health benefits. We discussed the following Behavioral Modification Strategies today: increasing lean protein intake, decreasing simple carbohydrates , work on meal planning and easy cooking plans and holiday eating strategies    Rebekah [name] has agreed to follow up with our clinic in 3 to 4 weeks. She was informed of the importance of frequent follow up visits to maximize her success with intensive lifestyle modifications for her multiple health conditions.   OBESITY BEHAVIORAL INTERVENTION VISIT  Today's visit was # 19   Starting weight: 267 lbs Starting date: 01/01/17 Today's weight : 235 lbs Today's date: 03/06/2018 Total lbs lost to date: 20    ASK: We discussed the diagnosis of obesity with Rebekah [name] today and Rebekah [name] agreed to give [name] permission to discuss obesity behavioral modification therapy today.  ASSESS: Rebekah [name] has the diagnosis of obesity and her BMI today is 44.43 Rebekah [name] is in the action stage of change   ADVISE: Rebekah [name] was educated on the multiple health risks of obesity as well as the benefit of weight loss to improve her health. She was advised of the need for long term treatment and the importance of lifestyle modifications to improve her current health and to decrease her risk of future health problems.  AGREE: Multiple dietary modification options and treatment options were discussed and  Rebekah [name] agreed to follow the recommendations documented in the above note.  ARRANGE: Rebekah [name] was educated on the importance of frequent visits to treat obesity as outlined per CMS and USPSTF guidelines and  agreed to schedule her next follow up appointment today.  I, Trixie Dredge, am acting as transcriptionist for Dennard Nip, MD  I have reviewed the above documentation for accuracy and completeness, and I agree with the above. -Dennard Nip, MD

## 2018-03-20 ENCOUNTER — Encounter (INDEPENDENT_AMBULATORY_CARE_PROVIDER_SITE_OTHER): Payer: Self-pay | Admitting: Family Medicine

## 2018-03-31 ENCOUNTER — Ambulatory Visit (INDEPENDENT_AMBULATORY_CARE_PROVIDER_SITE_OTHER): Payer: BC Managed Care – PPO | Admitting: Family Medicine

## 2018-04-01 ENCOUNTER — Encounter (INDEPENDENT_AMBULATORY_CARE_PROVIDER_SITE_OTHER): Payer: Self-pay | Admitting: Family Medicine

## 2018-04-03 ENCOUNTER — Ambulatory Visit (INDEPENDENT_AMBULATORY_CARE_PROVIDER_SITE_OTHER): Payer: BC Managed Care – PPO | Admitting: Family Medicine

## 2018-04-03 ENCOUNTER — Encounter (INDEPENDENT_AMBULATORY_CARE_PROVIDER_SITE_OTHER): Payer: Self-pay | Admitting: Family Medicine

## 2018-04-03 VITALS — BP 153/90 | HR 74 | Temp 97.9°F | Ht 61.0 in | Wt 237.0 lb

## 2018-04-03 DIAGNOSIS — I10 Essential (primary) hypertension: Secondary | ICD-10-CM | POA: Diagnosis not present

## 2018-04-03 DIAGNOSIS — Z9189 Other specified personal risk factors, not elsewhere classified: Secondary | ICD-10-CM

## 2018-04-03 DIAGNOSIS — Z6841 Body Mass Index (BMI) 40.0 and over, adult: Secondary | ICD-10-CM

## 2018-04-03 DIAGNOSIS — E559 Vitamin D deficiency, unspecified: Secondary | ICD-10-CM

## 2018-04-07 MED ORDER — VITAMIN D (ERGOCALCIFEROL) 1.25 MG (50000 UNIT) PO CAPS: 50000.0000 [IU] | ORAL_CAPSULE | ORAL | 0 refills | Status: DC

## 2018-04-07 NOTE — Progress Notes (Signed)
Office: [phone]  /  Fax: [phone]   HPI:   Chief Complaint: OBESITY Rebekah [name] is here to discuss her progress with her obesity treatment plan. She is on the Atkins Diet plan and is following her eating plan approximately 99.5 % of the time. She states she is exercising with resistance bands 20 minutes 5 times per week and she was "walking a lot in [place]". Rebekah [name] continuing to follow her Atkins plan and is working on keeping her net carb's below 20. Rebekah [name] states she rarely eats over 1,000 calories per day. Rebekah [name] is status post weight loss surgery. Her weight is 237 lb (107.5 kg) today and has had a weight gain of 2 pounds over a period of 4 weeks since her last visit. She has lost 30 lbs since starting treatment with [name].  Vitamin D deficiency Rebekah [name] has a diagnosis of vitamin D deficiency. She is not yet at goal. She is stable on vit D and denies nausea, vomiting or muscle weakness.  Hypertension Rebekah [name] is a 62 y.o. female with hypertension. Her blood pressure is elevated today at 153/90. She didn't take her medications today. Rebekah [name] denies chest pain or headache. She is working weight loss to help control her blood pressure with the goal of decreasing her risk of heart attack and stroke. Rebekah [name] blood pressure is not currently controlled.  ASSESSMENT AND PLAN:  Vitamin D deficiency - Plan: Vitamin D, Ergocalciferol, (DRISDOL) 1.25 MG (50000 UT) CAPS capsule  Essential hypertension  Class 3 severe obesity with serious comorbidity and body mass index (BMI) of 40.0 to 44.9 in adult, unspecified obesity type (Rebekah [name])  PLAN:  Vitamin D Deficiency Rebekah [name] was informed that low vitamin D levels contributes to fatigue and are associated with obesity, breast, and colon cancer. She agrees to continue to take prescription Vit D @50 ,000 IU every week and will follow up for routine testing of vitamin D, at least 2-3 times per year. She was informed of the risk of over-replacement of vitamin D  and agrees to not increase her dose unless she discusses this with [name] first.  Hypertension We discussed sodium restriction, working on healthy weight loss, and a regular exercise program as the means to achieve improved blood pressure control. Rebekah [name] agreed with this plan and agreed to follow up as directed. We will continue to monitor her blood pressure as well as her progress with the above lifestyle modifications. She will continue her medications as prescribed and diet prescription, with the goal of getting her blood pressure below 140/90.   Obesity Rebekah [name] is currently in the action stage of change. As such, her goal is to continue with weight loss efforts She has agreed to continue to follow the [place] has been instructed to work up to a goal of 150 minutes of combined cardio and strengthening exercise per week for weight loss and overall health benefits. We discussed the following Behavioral Modification Strategies today: increasing lean protein intake, celebration eating strategies and holiday eating strategies   Rebekah [name] has agreed to follow up with our clinic in 4 weeks. She was informed of the importance of frequent follow up visits to maximize her success with intensive lifestyle modifications for her multiple health conditions.  ALLERGIES: Allergies  Allergen Reactions  . Beef-Derived Products Anaphylaxis    After tick bite, cannot eat beef, pork or lamb  . Lambs Quarters Anaphylaxis    After tick bite cannot eat beef, pork or lamb  . Mucinex [Guaifenesin  Er] Anaphylaxis  . Pork-Derived Products Anaphylaxis    After tick bite cannot eat beef, pork or lamb  . Darvon [Propoxyphene] Other (See Comments)    Hallucinations   . Ace Inhibitors Swelling and Cough    Pedal Edema  . Hydromet [Hydrocodone-Homatropine] Itching and Other (See Comments)    Severe stomach pain-face itches.   . Sulfonamide Derivatives Rash    MEDICATIONS: Current Outpatient Medications on File  Prior to Visit  Medication Sig Dispense Refill  . acetaminophen (TYLENOL) 500 MG tablet Take 500-1,000 mg by mouth every 6 (six) hours as needed for moderate pain or headache.    Marland Kitchen CALCIUM PO Take 500 mg by mouth daily.     Marland Kitchen CARTIA XT 120 MG 24 hr capsule TAKE 1 CAPSULE BY MOUTH ONCE DAILY 90 capsule 1  . cetirizine (ZYRTEC) 10 MG tablet Take 10 mg by mouth at bedtime.     Marland Kitchen desmopressin (DDAVP) 0.2 MG tablet Take 0.6 mg by mouth at bedtime.    Marland Kitchen diltiazem (CARDIZEM) 30 MG tablet TAKE ONE TO TWO TABLETS BY MOUTH EVERY 6 HOURS AS NEEDED FOR  FAST  HEART  RATES 90 tablet 0  . diphenhydrAMINE (BENADRYL) 50 MG tablet Take 50 mg by mouth at bedtime as needed for itching.    Marland Kitchen EPINEPHrine (EPIPEN) 0.3 mg/0.3 mL IJ SOAJ injection Inject 0.3 mLs (0.3 mg total) into the muscle once as needed (anaphylaxis). 1 Device 6  . escitalopram (LEXAPRO) 10 MG tablet TAKE 1 TABLET BY MOUTH ONCE DAILY 90 tablet 3  . flecainide (TAMBOCOR) 100 MG tablet Take 3 tablets by mouth at onset of fast heart rate as needed. 270 tablet 0  . furosemide (LASIX) 20 MG tablet Take 1 tablet (20 mg total) by mouth daily as needed for edema. (Patient taking differently: Take 10 mg by mouth daily at 2 PM. Take 10mg  daily at 2pm) 30 tablet 3  . levothyroxine (SYNTHROID, LEVOTHROID) 50 MCG tablet TAKE 1 TABLET BY MOUTH ONCE DAILY BEFORE BREAKFAST 90 tablet 2  . losartan (COZAAR) 50 MG tablet Take 1 tablet (50 mg total) by mouth 2 (two) times daily. 180 tablet 3  . Multiple Vitamins-Minerals (MULTIVITAMIN ADULT PO) Take 1 capsule by mouth 2 (two) times daily. Journey 3+3 Bariatric Multivitamin    . oxybutynin (DITROPAN-XL) 10 MG 24 hr tablet Take 10 mg by mouth at bedtime.    . OXYGEN Inhale 3 L/min into the lungs at bedtime. Continuously    . traMADol (ULTRAM) 50 MG tablet Take 1 tablet (50 mg total) by mouth every 6 (six) hours as needed. 30 tablet 5  . XARELTO 20 MG TABS tablet TAKE 1 TABLET BY MOUTH ONCE DAILY WITH SUPPER 90 tablet 1    No current facility-administered medications on file prior to visit.     PAST MEDICAL HISTORY: Past Medical History:  Diagnosis Date  . Anxiety   . Arthritis   . Bilateral bunions   . Complication of anesthesia   . Difficulty sleeping    prior sleep study did not reveal sleep apnea per patient  . DJD (degenerative joint disease)   . Dyspnea   . Dysrhythmia    a-fib  . Environmental allergies   . Food allergy   . Gallbladder problem   . GERD (gastroesophageal reflux disease)   . Heart murmur   . Hypertension    no meds after weight loss  . Hypothyroidism   . Incontinence of urine    at nite   .  Joint pain   . Left atrial dilatation   . Left foot pain   . Leg edema   . Obesity    s/p gastric sleeve 12/2013 (previously weighed close to 400 lbs)  . Osteoarthritis   . Palpitations   . Paroxysmal atrial fibrillation (HCC)   . Pneumonia    hx of   . PONV (postoperative nausea and vomiting)   . Status post bilateral knee replacements   . Supplemental oxygen dependent    uses 2l/[employer] at night, states HR goes low and O2 drops  . Tuberculosis    had 6 month of INH due to exposure   . Vaginal vault prolapse     PAST SURGICAL HISTORY: Past Surgical History:  Procedure Laterality Date  . APPENDECTOMY    . CHOLECYSTECTOMY    . EYE SURGERY     03/2014 lens implant left lens   . FOOT ARTHRODESIS Left 03/15/2016   Procedure: TALONAVICULAR AND SUBTALAR ARTHRODESIS;  Surgeon: Wylene Simmer, MD;  Location: [facility];  Service: Orthopedics;  Laterality: Left;  Marland Kitchen GASTROC RECESSION EXTREMITY Left 03/15/2016   Procedure: LEFT GASTROC RECESSION;  Surgeon: Wylene Simmer, MD;  Location: [facility];  Service: Orthopedics;  Laterality: Left;  . JOINT REPLACEMENT  1999   L TOTAL KNEE  . LAPAROSCOPIC GASTRIC SLEEVE RESECTION N/A 01/04/2014   Procedure: LAPAROSCOPIC GASTRIC SLEEVE RESECTION;  Surgeon: Greer Pickerel, MD;  Location: [facility];  Service: General;   Laterality: N/A;  . TONSILLECTOMY    . TOTAL KNEE ARTHROPLASTY Right 05/17/2015   Procedure: RIGHT TOTAL KNEE ARTHROPLASTY;  Surgeon: Paralee Cancel, MD;  Location: [facility];  Service: Orthopedics;  Laterality: Right;  . TRANSTHORACIC ECHOCARDIOGRAM  11/2012   EF 60-65%, mod LVH & mod conc hypertrophy, grade 1 diastolic dysfunction; LA mildly dilated; RV systolic pressure increased; PA peak pressure 32mmHg  . TUBAL LIGATION    . UPPER GI ENDOSCOPY  01/04/2014   Procedure: UPPER GI ENDOSCOPY;  Surgeon: Greer Pickerel, MD;  Location: [facility];  Service: General;;    SOCIAL HISTORY: Social History   Tobacco Use  . Smoking status: Never Smoker  . Smokeless tobacco: Never Used  Substance Use Topics  . Alcohol use: No  . Drug use: No    FAMILY HISTORY: Family History  Problem Relation Age of Onset  . Diabetes Father   . Hyperlipidemia Father   . Hypertension Father   . Heart disease Father   . Sudden death Father   . Anxiety disorder Father   . Obesity Father   . Uterine cancer Mother   . Cervical cancer Mother   . Breast cancer Mother   . Cancer Mother        cervical and brreast cancer  . Hypertension Mother   . Hyperlipidemia Mother   . Obesity Mother   . Diabetes Maternal Grandmother     ROS: Review of Systems  Constitutional: Negative for weight loss.  Cardiovascular: Negative for chest pain.  Gastrointestinal: Negative for nausea and vomiting.  Musculoskeletal:       Negative for muscle weakness  Neurological: Negative for headaches.    PHYSICAL EXAM: Blood pressure (!) 153/90, pulse 74, temperature 97.9 F (36.6 C), temperature source Oral, height 5\' 1"  (1.549 m), weight 237 lb (107.5 kg), SpO2 98 %. Body mass index is 44.78 kg/m. Physical Exam Vitals signs reviewed.  Constitutional:      Appearance: Normal appearance. She is well-developed. She is obese.  Cardiovascular:  Rate and Rhythm: Normal rate.  Pulmonary:     Effort: Pulmonary effort is normal.    Musculoskeletal: Normal range of motion.  Skin:    General: Skin is warm and dry.  Neurological:     Mental Status: She is alert and oriented to person, place, and time.  Psychiatric:        Mood and Affect: Mood normal.        Behavior: Behavior normal.     RECENT LABS AND TESTS: BMET    Component Value Date/Time   NA 144 03/06/2018 1319   K 4.6 03/06/2018 1319   CL 100 03/06/2018 1319   CO2 25 03/06/2018 1319   GLUCOSE 78 03/06/2018 1319   GLUCOSE 141 (H) 05/18/2015 0456   BUN 16 03/06/2018 1319   CREATININE 0.85 03/06/2018 1319   CALCIUM 10.5 (H) 03/06/2018 1319   GFRNONAA 73 03/06/2018 1319   GFRAA 84 03/06/2018 1319   Lab Results  Component Value Date   HGBA1C 5.5 03/06/2018   HGBA1C 5.7 (H) 12/02/2017   HGBA1C 5.5 05/02/2017   HGBA1C 5.9 (H) 01/01/2017   HGBA1C 6.1 (H) 07/02/2013   Lab Results  Component Value Date   INSULIN 6.5 03/06/2018   INSULIN 9.2 12/02/2017   INSULIN 7.9 05/02/2017   INSULIN 9.8 01/01/2017   CBC    Component Value Date/Time   WBC 7.6 01/01/2017 1335   WBC 8.0 05/18/2015 0456   RBC 4.91 01/01/2017 1335   RBC 3.29 (L) 05/18/2015 0456   HGB 13.6 01/01/2017 1335   HCT 42.6 01/01/2017 1335   PLT 117 (L) 05/18/2015 0456   MCV 87 01/01/2017 1335   MCH 27.7 01/01/2017 1335   MCH 29.2 05/18/2015 0456   MCHC 31.9 01/01/2017 1335   MCHC 32.3 05/18/2015 0456   RDW 14.7 01/01/2017 1335   LYMPHSABS 1.9 01/01/2017 1335   MONOABS 0.8 03/26/2015 0316   EOSABS 0.2 01/01/2017 1335   BASOSABS 0.1 01/01/2017 1335   Iron/TIBC/Ferritin/ %Sat No results found for: IRON, TIBC, FERRITIN, IRONPCTSAT Lipid Panel     Component Value Date/Time   CHOL 179 03/06/2018 1319   TRIG 84 03/06/2018 1319   HDL 64 03/06/2018 1319   CHOLHDL 2.8 03/06/2018 1319   CHOLHDL 4.2 07/02/2013 1301   VLDL 43 (H) 07/02/2013 1301   LDLCALC 98 03/06/2018 1319   Hepatic Function Panel     Component Value Date/Time   PROT 7.6 03/06/2018 1319   ALBUMIN 4.6  03/06/2018 1319   AST 19 03/06/2018 1319   ALT 17 03/06/2018 1319   ALKPHOS 85 03/06/2018 1319   BILITOT 0.5 03/06/2018 1319      Component Value Date/Time   TSH 3.580 03/06/2018 1319   TSH 3.090 05/02/2017 0850   TSH 2.350 01/01/2017 1335      OBESITY BEHAVIORAL INTERVENTION VISIT  Today's visit was # 20   Starting weight: 267 lbs Starting date: 01/01/2017 Today's weight : 237 lbs Today's date: 04/03/2018 Total lbs lost to date: 30   ASK: We discussed the diagnosis of obesity with Rebekah [name] today and Julien agreed to give [name] permission to discuss obesity behavioral modification therapy today.  ASSESS: Rebekah [name] has the diagnosis of obesity and her BMI today is 56.8 Rebekah [name] is in the action stage of change   ADVISE: Rebekah [name] was educated on the multiple health risks of obesity as well as the benefit of weight loss to improve her health. She was advised of the need for long term treatment and  the importance of lifestyle modifications to improve her current health and to decrease her risk of future health problems.  AGREE: Multiple dietary modification options and treatment options were discussed and  Rebekah [name] agreed to follow the recommendations documented in the above note.  ARRANGE: Rebekah [name] was educated on the importance of frequent visits to treat obesity as outlined per CMS and USPSTF guidelines and agreed to schedule her next follow up appointment today.  I, Doreene Nest, am acting as transcriptionist for Dennard Nip, MD  I have reviewed the above documentation for accuracy and completeness, and I agree with the above. -Dennard Nip, MD

## 2018-05-04 ENCOUNTER — Other Ambulatory Visit: Payer: Self-pay | Admitting: Internal Medicine

## 2018-05-05 ENCOUNTER — Ambulatory Visit (INDEPENDENT_AMBULATORY_CARE_PROVIDER_SITE_OTHER): Payer: BC Managed Care – PPO | Admitting: Family Medicine

## 2018-05-05 ENCOUNTER — Encounter (INDEPENDENT_AMBULATORY_CARE_PROVIDER_SITE_OTHER): Payer: Self-pay | Admitting: Family Medicine

## 2018-05-05 VITALS — BP 125/85 | HR 71 | Temp 98.2°F | Ht 61.0 in | Wt 232.0 lb

## 2018-05-05 DIAGNOSIS — Z9189 Other specified personal risk factors, not elsewhere classified: Secondary | ICD-10-CM | POA: Diagnosis not present

## 2018-05-05 DIAGNOSIS — F3289 Other specified depressive episodes: Secondary | ICD-10-CM | POA: Diagnosis not present

## 2018-05-05 DIAGNOSIS — E559 Vitamin D deficiency, unspecified: Secondary | ICD-10-CM

## 2018-05-05 DIAGNOSIS — R600 Localized edema: Secondary | ICD-10-CM

## 2018-05-05 DIAGNOSIS — Z6841 Body Mass Index (BMI) 40.0 and over, adult: Secondary | ICD-10-CM

## 2018-05-05 DIAGNOSIS — L650 Telogen effluvium: Secondary | ICD-10-CM | POA: Diagnosis not present

## 2018-05-05 MED ORDER — VITAMIN D (ERGOCALCIFEROL) 1.25 MG (50000 UNIT) PO CAPS: 50000.0000 [IU] | ORAL_CAPSULE | ORAL | 0 refills | Status: DC

## 2018-05-05 NOTE — Telephone Encounter (Signed)
Rx has been sent to the pharmacy electronically. ° °

## 2018-05-06 NOTE — Progress Notes (Signed)
Office: [phone]  /  Fax: [phone]   HPI:   Chief Complaint: OBESITY Rebekah [name] is here to discuss her progress with her obesity treatment plan. She is on the [employer] plan and is following her eating plan approximately 100 % of the time. She states she is doing leg lifts and counter push ups for 15-20 minutes 7 times per week. Rebekah [name] states she has done better with weight loss over the last month with decreased traveling. She is counting carbohydrates and following Atkins. She notes cravings are still a problem.  Her weight is 232 lb (105.2 kg) today and has had a weight loss of 5 pounds over a period of 4 to 5 weeks since her last visit. She has lost 35 lbs since starting treatment with [name].  Vitamin D Deficiency Rebekah [name] has a diagnosis of vitamin D deficiency. She is stable on prescription Vit D, but level is not yet at goal. She denies nausea, vomiting or muscle weakness.  At risk for osteopenia and osteoporosis Rebekah [name] is at higher risk of osteopenia and osteoporosis due to vitamin D deficiency.   Telogen Effluvium Rebekah [name] notes generalized thinning of her hair the last few months. Her thyroid and vitamin levels are stable. She has no recent surgery but increased stressors.  Lower Extremity Edema Rebekah [name] is on Lasix 20 mg as needed and was changed to 10 mg daily. She feels this dose isn't helping, especially as she does not take it daily.  Depression with emotional eating behaviors Rebekah [name] is on Lexapro but would like to taper off. She also notes increased cravings for certain foods though. Rebekah [name] struggles with emotional eating and using food for comfort to the extent that it is negatively impacting her health. She often snacks when she is not hungry. Rebekah [name] sometimes feels she is out of control and then feels guilty that she made poor food choices. She has been working on behavior modification techniques to help reduce her emotional eating and has been somewhat successful. She shows no sign of  suicidal or homicidal ideations.  Depression screen [facility] 2/9 07/17/2017 01/01/2017 04/20/2016 04/05/2016 12/27/2015  Decreased Interest 0 2 0 0 0  Down, Depressed, Hopeless 0 1 0 0 0  PHQ - 2 Score 0 3 0 0 0  Altered sleeping - 0 - - -  Tired, decreased energy - 2 - - -  Change in appetite - 1 - - -  Feeling bad or failure about yourself  - 3 - - -  Trouble concentrating - 0 - - -  Moving slowly or fidgety/restless - 3 - - -  Suicidal thoughts - 0 - - -  PHQ-9 Score - 12 - - -  Difficult doing work/chores - Very difficult - - -    ASSESSMENT AND PLAN:  Vitamin D deficiency - Plan: Vitamin D, Ergocalciferol, (DRISDOL) 1.25 MG (50000 UT) CAPS capsule  Telogen effluvium  Lower extremity edema  Other depression - with emotional eating   At risk for osteoporosis  Class 3 severe obesity with serious comorbidity and body mass index (BMI) of 40.0 to 44.9 in adult, unspecified obesity type (HCC)  PLAN:  Vitamin D Deficiency Rebekah [name] was informed that low vitamin D levels contributes to fatigue and are associated with obesity, breast, and colon cancer. Rebekah [name] agrees to continue taking prescription Vit D @50 ,000 IU every week #4 and we will refill for 1 month. She will follow up for routine testing of vitamin D, at least 2-3 times per year. She  was informed of the risk of over-replacement of vitamin D and agrees to not increase her dose unless she discusses this with [name] first. Rebekah [name] agrees to follow up with our clinic in 4 to 5 weeks.  At risk for osteopenia and osteoporosis Rebekah [name] was given extended (15 minutes) osteoporosis prevention counseling today. Rebekah [name] is at risk for osteopenia and osteoporsis due to her vitamin D deficiency. She was encouraged to take her vitamin D and follow her higher calcium diet and increase strengthening exercise to help strengthen her bones and decrease her risk of osteopenia and osteoporosis.  Telogen Effluvium Rebekah [name] was educated on her diagnosis and no further treatment  is needed. If hair thinning continues then we will refer to Dermatology. Rebekah [name] agrees to follow up with our clinic in 4 to 5 weeks.  Lower Extremity Edema Rebekah [name] will continue with diet and weight loss. She is to discuss Lasix with her Urologist who started her on it originally. Rebekah [name] agrees to follow up with our clinic in 4 to 5 weeks.  Depression with Emotional Eating Behaviors We discussed behavior modification techniques today to help Dia deal with her emotional eating and depression. [name] will taper off Lexapro for 2 weeks QOD and will monitor closely. Rebekah [name] agrees to follow up with our clinic in 4 to 5 weeks.  Obesity Rebekah [name] is currently in the action stage of change. As such, her goal is to continue with weight loss efforts She has agreed to follow the [place] has been instructed to work up to a goal of 150 minutes of combined cardio and strengthening exercise per week for weight loss and overall health benefits. We discussed the following Behavioral Modification Strategies today: increasing lean protein intake, decreasing simple carbohydrates , increasing vegetables and emotional eating strategies   Rebekah [name] has agreed to follow up with our clinic in 4 to 5 weeks. She was informed of the importance of frequent follow up visits to maximize her success with intensive lifestyle modifications for her multiple health conditions.  ALLERGIES: Allergies  Allergen Reactions  . Beef-Derived Products Anaphylaxis    After tick bite, cannot eat beef, pork or lamb  . Lambs Quarters Anaphylaxis    After tick bite cannot eat beef, pork or lamb  . Mucinex [Guaifenesin Er] Anaphylaxis  . Pork-Derived Products Anaphylaxis    After tick bite cannot eat beef, pork or lamb  . Darvon [Propoxyphene] Other (See Comments)    Hallucinations   . Ace Inhibitors Swelling and Cough    Pedal Edema  . Hydromet [Hydrocodone-Homatropine] Itching and Other (See Comments)    Severe stomach pain-face itches.    . Sulfonamide Derivatives Rash    MEDICATIONS: Current Outpatient Medications on File Prior to Visit  Medication Sig Dispense Refill  . acetaminophen (TYLENOL) 500 MG tablet Take 500-1,000 mg by mouth every 6 (six) hours as needed for moderate pain or headache.    Marland Kitchen CALCIUM PO Take 500 mg by mouth daily.     Marland Kitchen CARTIA XT 120 MG 24 hr capsule TAKE 1 CAPSULE BY MOUTH ONCE DAILY (NEEDS  APPOINTMENT  FOR  REFILLS) 90 capsule 2  . cetirizine (ZYRTEC) 10 MG tablet Take 10 mg by mouth at bedtime.     Marland Kitchen desmopressin (DDAVP) 0.2 MG tablet Take 0.6 mg by mouth at bedtime.    Marland Kitchen diltiazem (CARDIZEM) 30 MG tablet TAKE ONE TO TWO TABLETS BY MOUTH EVERY 6 HOURS AS NEEDED FOR  FAST  HEART  RATES 90 tablet 0  .  EPINEPHrine (EPIPEN) 0.3 mg/0.3 mL IJ SOAJ injection Inject 0.3 mLs (0.3 mg total) into the muscle once as needed (anaphylaxis). 1 Device 6  . escitalopram (LEXAPRO) 10 MG tablet TAKE 1 TABLET BY MOUTH ONCE DAILY (Patient taking differently: Take one tab every other day x 2 wks) 90 tablet 3  . flecainide (TAMBOCOR) 100 MG tablet Take 3 tablets by mouth at onset of fast heart rate as needed. 270 tablet 0  . furosemide (LASIX) 20 MG tablet Take 1 tablet (20 mg total) by mouth daily as needed for edema. (Patient taking differently: Take 10 mg by mouth daily at 2 PM. Take 10mg  daily at 2pm) 30 tablet 3  . levothyroxine (SYNTHROID, LEVOTHROID) 50 MCG tablet TAKE 1 TABLET BY MOUTH ONCE DAILY BEFORE BREAKFAST 90 tablet 2  . losartan (COZAAR) 50 MG tablet Take 1 tablet (50 mg total) by mouth 2 (two) times daily. 180 tablet 3  . Multiple Vitamins-Minerals (MULTIVITAMIN ADULT PO) Take 1 capsule by mouth 2 (two) times daily. Journey 3+3 Bariatric Multivitamin    . oxybutynin (DITROPAN-XL) 10 MG 24 hr tablet Take 10 mg by mouth at bedtime.    . OXYGEN Inhale 3 L/min into the lungs at bedtime. Continuously    . traMADol (ULTRAM) 50 MG tablet Take 1 tablet (50 mg total) by mouth every 6 (six) hours as needed. 30  tablet 5  . XARELTO 20 MG TABS tablet TAKE 1 TABLET BY MOUTH ONCE DAILY WITH SUPPER 90 tablet 1   No current facility-administered medications on file prior to visit.     PAST MEDICAL HISTORY: Past Medical History:  Diagnosis Date  . Anxiety   . Arthritis   . Bilateral bunions   . Complication of anesthesia   . Difficulty sleeping    prior sleep study did not reveal sleep apnea per patient  . DJD (degenerative joint disease)   . Dyspnea   . Dysrhythmia    a-fib  . Environmental allergies   . Food allergy   . Gallbladder problem   . GERD (gastroesophageal reflux disease)   . Heart murmur   . Hypertension    no meds after weight loss  . Hypothyroidism   . Incontinence of urine    at nite   . Joint pain   . Left atrial dilatation   . Left foot pain   . Leg edema   . Obesity    s/p gastric sleeve 12/2013 (previously weighed close to 400 lbs)  . Osteoarthritis   . Palpitations   . Paroxysmal atrial fibrillation (HCC)   . Pneumonia    hx of   . PONV (postoperative nausea and vomiting)   . Status post bilateral knee replacements   . Supplemental oxygen dependent    uses 2l/[place] at night, states HR goes low and O2 drops  . Tuberculosis    had 6 month of INH due to exposure   . Vaginal vault prolapse     PAST SURGICAL HISTORY: Past Surgical History:  Procedure Laterality Date  . APPENDECTOMY    . CHOLECYSTECTOMY    . EYE SURGERY     03/2014 lens implant left lens   . FOOT ARTHRODESIS Left 03/15/2016   Procedure: TALONAVICULAR AND SUBTALAR ARTHRODESIS;  Surgeon: Wylene Simmer, MD;  Location: [facility];  Service: Orthopedics;  Laterality: Left;  Marland Kitchen GASTROC RECESSION EXTREMITY Left 03/15/2016   Procedure: LEFT GASTROC RECESSION;  Surgeon: Wylene Simmer, MD;  Location: [place];  Service: Orthopedics;  Laterality: Left;  . JOINT REPLACEMENT  1999   L TOTAL KNEE  . LAPAROSCOPIC GASTRIC SLEEVE RESECTION N/A 01/04/2014   Procedure: LAPAROSCOPIC  GASTRIC SLEEVE RESECTION;  Surgeon: Greer Pickerel, MD;  Location: [facility];  Service: General;  Laterality: N/A;  . TONSILLECTOMY    . TOTAL KNEE ARTHROPLASTY Right 05/17/2015   Procedure: RIGHT TOTAL KNEE ARTHROPLASTY;  Surgeon: Paralee Cancel, MD;  Location: [facility];  Service: Orthopedics;  Laterality: Right;  . TRANSTHORACIC ECHOCARDIOGRAM  11/2012   EF 60-65%, mod LVH & mod conc hypertrophy, grade 1 diastolic dysfunction; LA mildly dilated; RV systolic pressure increased; PA peak pressure 3mmHg  . TUBAL LIGATION    . UPPER GI ENDOSCOPY  01/04/2014   Procedure: UPPER GI ENDOSCOPY;  Surgeon: Greer Pickerel, MD;  Location: [facility];  Service: General;;    SOCIAL HISTORY: Social History   Tobacco Use  . Smoking status: Never Smoker  . Smokeless tobacco: Never Used  Substance Use Topics  . Alcohol use: No  . Drug use: No    FAMILY HISTORY: Family History  Problem Relation Age of Onset  . Diabetes Father   . Hyperlipidemia Father   . Hypertension Father   . Heart disease Father   . Sudden death Father   . Anxiety disorder Father   . Obesity Father   . Uterine cancer Mother   . Cervical cancer Mother   . Breast cancer Mother   . Cancer Mother        cervical and brreast cancer  . Hypertension Mother   . Hyperlipidemia Mother   . Obesity Mother   . Diabetes Maternal Grandmother     ROS: Review of Systems  Constitutional: Positive for weight loss.  Gastrointestinal: Negative for nausea and vomiting.  Musculoskeletal:       Negative muscle weakness  Skin:       + Hair thinning  Psychiatric/Behavioral: Positive for depression. Negative for suicidal ideas.    PHYSICAL EXAM: Blood pressure 125/85, pulse 71, temperature 98.2 F (36.8 C), temperature source Oral, height 5\' 1"  (1.549 m), weight 232 lb (105.2 kg), SpO2 99 %. Body mass index is 43.84 kg/m. Physical Exam Vitals signs reviewed.  Constitutional:      Appearance: Normal appearance. She is obese.  Cardiovascular:      Rate and Rhythm: Normal rate.     Pulses: Normal pulses.  Pulmonary:     Effort: Pulmonary effort is normal.     Breath sounds: Normal breath sounds.  Musculoskeletal: Normal range of motion.     Comments: + Lower extremity edema  Skin:    General: Skin is warm and dry.  Neurological:     Mental Status: She is alert and oriented to person, place, and time.  Psychiatric:        Mood and Affect: Mood normal.        Behavior: Behavior normal.     RECENT LABS AND TESTS: BMET    Component Value Date/Time   NA 144 03/06/2018 1319   K 4.6 03/06/2018 1319   CL 100 03/06/2018 1319   CO2 25 03/06/2018 1319   GLUCOSE 78 03/06/2018 1319   GLUCOSE 141 (H) 05/18/2015 0456   BUN 16 03/06/2018 1319   CREATININE 0.85 03/06/2018 1319   CALCIUM 10.5 (H) 03/06/2018 1319   GFRNONAA 73 03/06/2018 1319   GFRAA 84 03/06/2018 1319   Lab Results  Component Value Date   HGBA1C 5.5 03/06/2018   HGBA1C 5.7 (H) 12/02/2017   HGBA1C  5.5 05/02/2017   HGBA1C 5.9 (H) 01/01/2017   HGBA1C 6.1 (H) 07/02/2013   Lab Results  Component Value Date   INSULIN 6.5 03/06/2018   INSULIN 9.2 12/02/2017   INSULIN 7.9 05/02/2017   INSULIN 9.8 01/01/2017   CBC    Component Value Date/Time   WBC 7.6 01/01/2017 1335   WBC 8.0 05/18/2015 0456   RBC 4.91 01/01/2017 1335   RBC 3.29 (L) 05/18/2015 0456   HGB 13.6 01/01/2017 1335   HCT 42.6 01/01/2017 1335   PLT 117 (L) 05/18/2015 0456   MCV 87 01/01/2017 1335   MCH 27.7 01/01/2017 1335   MCH 29.2 05/18/2015 0456   MCHC 31.9 01/01/2017 1335   MCHC 32.3 05/18/2015 0456   RDW 14.7 01/01/2017 1335   LYMPHSABS 1.9 01/01/2017 1335   MONOABS 0.8 03/26/2015 0316   EOSABS 0.2 01/01/2017 1335   BASOSABS 0.1 01/01/2017 1335   Iron/TIBC/Ferritin/ %Sat No results found for: IRON, TIBC, FERRITIN, IRONPCTSAT Lipid Panel     Component Value Date/Time   CHOL 179 03/06/2018 1319   TRIG 84 03/06/2018 1319   HDL 64 03/06/2018 1319   CHOLHDL 2.8 03/06/2018 1319    CHOLHDL 4.2 07/02/2013 1301   VLDL 43 (H) 07/02/2013 1301   LDLCALC 98 03/06/2018 1319   Hepatic Function Panel     Component Value Date/Time   PROT 7.6 03/06/2018 1319   ALBUMIN 4.6 03/06/2018 1319   AST 19 03/06/2018 1319   ALT 17 03/06/2018 1319   ALKPHOS 85 03/06/2018 1319   BILITOT 0.5 03/06/2018 1319      Component Value Date/Time   TSH 3.580 03/06/2018 1319   TSH 3.090 05/02/2017 0850   TSH 2.350 01/01/2017 1335      OBESITY BEHAVIORAL INTERVENTION VISIT  Today's visit was # 21   Starting weight: 267 lbs Starting date: 01/01/17 Today's weight : 232 lbs  Today's date: 05/05/2018 Total lbs lost to date: 38    ASK: We discussed the diagnosis of obesity with Rebekah [name] today and Rebekah [name] agreed to give [name] permission to discuss obesity behavioral modification therapy today.  ASSESS: Gennifer has the diagnosis of obesity and her BMI today is 43.86 Corene is in the action stage of change   ADVISE: Sonyia was educated on the multiple health risks of obesity as well as the benefit of weight loss to improve her health. She was advised of the need for long term treatment and the importance of lifestyle modifications to improve her current health and to decrease her risk of future health problems.  AGREE: Multiple dietary modification options and treatment options were discussed and  Naela agreed to follow the recommendations documented in the above note.  ARRANGE: Rebekah [name] was educated on the importance of frequent visits to treat obesity as outlined per CMS and USPSTF guidelines and agreed to schedule her next follow up appointment today.  I, Trixie Dredge, am acting as transcriptionist for Dennard Nip, MD  I have reviewed the above documentation for accuracy and completeness, and I agree with the above. -Dennard Nip, MD

## 2018-05-22 ENCOUNTER — Other Ambulatory Visit: Payer: Self-pay | Admitting: Internal Medicine

## 2018-05-22 DIAGNOSIS — R600 Localized edema: Secondary | ICD-10-CM

## 2018-05-22 MED ORDER — FUROSEMIDE 20 MG PO TABS: 20.0000 mg | ORAL_TABLET | Freq: Every day | ORAL | 3 refills | Status: DC | PRN

## 2018-05-22 NOTE — Progress Notes (Signed)
Rebekah [name] is reporting increasing edema - wants to restart low dose furosemide 20 mg daily. Order renewed.  Dr. Lemmie Evens

## 2018-06-02 ENCOUNTER — Ambulatory Visit (INDEPENDENT_AMBULATORY_CARE_PROVIDER_SITE_OTHER): Payer: BC Managed Care – PPO | Admitting: Family Medicine

## 2018-06-10 ENCOUNTER — Ambulatory Visit (INDEPENDENT_AMBULATORY_CARE_PROVIDER_SITE_OTHER): Payer: BC Managed Care – PPO | Admitting: Family Medicine

## 2018-06-10 ENCOUNTER — Encounter (INDEPENDENT_AMBULATORY_CARE_PROVIDER_SITE_OTHER): Payer: Self-pay | Admitting: Family Medicine

## 2018-06-10 VITALS — BP 121/78 | HR 76 | Ht 61.0 in | Wt 228.0 lb

## 2018-06-10 DIAGNOSIS — Z6841 Body Mass Index (BMI) 40.0 and over, adult: Secondary | ICD-10-CM

## 2018-06-10 DIAGNOSIS — E559 Vitamin D deficiency, unspecified: Secondary | ICD-10-CM

## 2018-06-10 DIAGNOSIS — Z9189 Other specified personal risk factors, not elsewhere classified: Secondary | ICD-10-CM

## 2018-06-10 MED ORDER — VITAMIN D (ERGOCALCIFEROL) 1.25 MG (50000 UNIT) PO CAPS: 50000.0000 [IU] | ORAL_CAPSULE | ORAL | 0 refills | Status: DC

## 2018-06-11 ENCOUNTER — Other Ambulatory Visit: Payer: Self-pay

## 2018-06-11 MED ORDER — RIVAROXABAN 20 MG PO TABS: ORAL_TABLET | ORAL | 1 refills | Status: DC

## 2018-06-11 NOTE — Progress Notes (Signed)
Office: [phone]  /  Fax: [phone]   HPI:   Chief Complaint: OBESITY Rebekah [name] is here to discuss her progress with her obesity treatment plan. She is on the [employer] plan and is following her eating plan approximately 100 % of the time. She states she is more mindful of steps, and is doing wall pushups and chair squats. Rebekah [name] continues to do well with weight loss on her low carbohydrate plan. Her hunger is controlled and she is working on increasing vegetable.  Her weight is 228 lb (103.4 kg) today and has had a weight loss of 4 pounds over a period of 5 weeks since her last visit. She has lost 39 lbs since starting treatment with [name].  Vitamin D Deficiency Rebekah [name] has a diagnosis of vitamin D deficiency. She is stable on prescription Vit D and last level is at goal. She denies nausea, vomiting or muscle weakness.  At risk for osteopenia and osteoporosis Rebekah [name] is at higher risk of osteopenia and osteoporosis due to vitamin D deficiency.   ASSESSMENT AND PLAN:  Vitamin D deficiency - Plan: Vitamin D, Ergocalciferol, (DRISDOL) 1.25 MG (50000 UT) CAPS capsule  At risk for osteoporosis  Class 3 severe obesity with serious comorbidity and body mass index (BMI) of 40.0 to 44.9 in adult, unspecified obesity type ([place])  PLAN:  Vitamin D Deficiency Rebekah [name] was informed that low vitamin D levels contributes to fatigue and are associated with obesity, breast, and colon cancer. Annaleise agrees to continue taking prescription Vit D @50 ,000 IU every week #4 and we will refill for 1 month. She will follow up for routine testing of vitamin D, at least 2-3 times per year. She was informed of the risk of over-replacement of vitamin D and agrees to not increase her dose unless she discusses this with [name] first. We will recheck labs in 1 month. Rebekah [name] agrees to follow up with our clinic in 4 weeks.  At risk for osteopenia and osteoporosis Rebekah [name] was given extended (15 minutes) osteoporosis prevention counseling  today. Rebekah [name] is at risk for osteopenia and osteoporsis due to her vitamin D deficiency. She was encouraged to take her vitamin D and follow her higher calcium diet and increase strengthening exercise to help strengthen her bones and decrease her risk of osteopenia and osteoporosis.  Obesity Rebekah [name] is currently in the action stage of change. As such, her goal is to continue with weight loss efforts She has agreed to [place] has been instructed to work up to a goal of 150 minutes of combined cardio and strengthening exercise per week for weight loss and overall health benefits. We discussed the following Behavioral Modification Strategies today: increasing lean protein intake, decreasing simple carbohydrates , increasing vegetables, decrease eating out and work on meal planning and easy cooking plans   Rebekah [name] has agreed to follow up with our clinic in 4 weeks. She was informed of the importance of frequent follow up visits to maximize her success with intensive lifestyle modifications for her multiple health conditions.  ALLERGIES: Allergies  Allergen Reactions  . Beef-Derived Products Anaphylaxis    After tick bite, cannot eat beef, pork or lamb  . Lambs Quarters Anaphylaxis    After tick bite cannot eat beef, pork or lamb  . Mucinex [Guaifenesin Er] Anaphylaxis  . Pork-Derived Products Anaphylaxis    After tick bite cannot eat beef, pork or lamb  . Darvon [Propoxyphene] Other (See Comments)    Hallucinations   . Ace Inhibitors Swelling  and Cough    Pedal Edema  . Hydromet [Hydrocodone-Homatropine] Itching and Other (See Comments)    Severe stomach pain-face itches.   . Sulfonamide Derivatives Rash    MEDICATIONS: Current Outpatient Medications on File Prior to Visit  Medication Sig Dispense Refill  . acetaminophen (TYLENOL) 500 MG tablet Take 500-1,000 mg by mouth every 6 (six) hours as needed for moderate pain or headache.    Marland Kitchen CALCIUM PO Take 500 mg by mouth daily.       Marland Kitchen CARTIA XT 120 MG 24 hr capsule TAKE 1 CAPSULE BY MOUTH ONCE DAILY (NEEDS  APPOINTMENT  FOR  REFILLS) 90 capsule 2  . cetirizine (ZYRTEC) 10 MG tablet Take 10 mg by mouth at bedtime.     Marland Kitchen desmopressin (DDAVP) 0.2 MG tablet Take 0.6 mg by mouth at bedtime.    Marland Kitchen diltiazem (CARDIZEM) 30 MG tablet TAKE ONE TO TWO TABLETS BY MOUTH EVERY 6 HOURS AS NEEDED FOR  FAST  HEART  RATES 90 tablet 0  . EPINEPHrine (EPIPEN) 0.3 mg/0.3 mL IJ SOAJ injection Inject 0.3 mLs (0.3 mg total) into the muscle once as needed (anaphylaxis). 1 Device 6  . flecainide (TAMBOCOR) 100 MG tablet Take 3 tablets by mouth at onset of fast heart rate as needed. 270 tablet 0  . furosemide (LASIX) 20 MG tablet Take 1 tablet (20 mg total) by mouth daily as needed for edema. 30 tablet 3  . levothyroxine (SYNTHROID, LEVOTHROID) 50 MCG tablet TAKE 1 TABLET BY MOUTH ONCE DAILY BEFORE BREAKFAST 90 tablet 2  . losartan (COZAAR) 50 MG tablet Take 1 tablet (50 mg total) by mouth 2 (two) times daily. 180 tablet 3  . Multiple Vitamins-Minerals (MULTIVITAMIN ADULT PO) Take 1 capsule by mouth 2 (two) times daily. Journey 3+3 Bariatric Multivitamin    . oxybutynin (DITROPAN-XL) 10 MG 24 hr tablet Take 10 mg by mouth at bedtime.    . OXYGEN Inhale 3 L/min into the lungs at bedtime. Continuously    . traMADol (ULTRAM) 50 MG tablet Take 1 tablet (50 mg total) by mouth every 6 (six) hours as needed. 30 tablet 5  . XARELTO 20 MG TABS tablet TAKE 1 TABLET BY MOUTH ONCE DAILY WITH SUPPER 90 tablet 1   No current facility-administered medications on file prior to visit.     PAST MEDICAL HISTORY: Past Medical History:  Diagnosis Date  . Anxiety   . Arthritis   . Bilateral bunions   . Complication of anesthesia   . Difficulty sleeping    prior sleep study did not reveal sleep apnea per patient  . DJD (degenerative joint disease)   . Dyspnea   . Dysrhythmia    a-fib  . Environmental allergies   . Food allergy   . Gallbladder problem   . GERD  (gastroesophageal reflux disease)   . Heart murmur   . Hypertension    no meds after weight loss  . Hypothyroidism   . Incontinence of urine    at nite   . Joint pain   . Left atrial dilatation   . Left foot pain   . Leg edema   . Obesity    s/p gastric sleeve 12/2013 (previously weighed close to 400 lbs)  . Osteoarthritis   . Palpitations   . Paroxysmal atrial fibrillation (HCC)   . Pneumonia    hx of   . PONV (postoperative nausea and vomiting)   . Status post bilateral knee replacements   . Supplemental oxygen dependent  uses 2l/Friend at night, states HR goes low and O2 drops  . Tuberculosis    had 6 month of INH due to exposure   . Vaginal vault prolapse     PAST SURGICAL HISTORY: Past Surgical History:  Procedure Laterality Date  . APPENDECTOMY    . CHOLECYSTECTOMY    . EYE SURGERY     03/2014 lens implant left lens   . FOOT ARTHRODESIS Left 03/15/2016   Procedure: TALONAVICULAR AND SUBTALAR ARTHRODESIS;  Surgeon: Wylene Simmer, MD;  Location: [facility];  Service: Orthopedics;  Laterality: Left;  Marland Kitchen GASTROC RECESSION EXTREMITY Left 03/15/2016   Procedure: LEFT GASTROC RECESSION;  Surgeon: Wylene Simmer, MD;  Location: [facility];  Service: Orthopedics;  Laterality: Left;  . JOINT REPLACEMENT  1999   L TOTAL KNEE  . LAPAROSCOPIC GASTRIC SLEEVE RESECTION N/A 01/04/2014   Procedure: LAPAROSCOPIC GASTRIC SLEEVE RESECTION;  Surgeon: Greer Pickerel, MD;  Location: [facility];  Service: General;  Laterality: N/A;  . TONSILLECTOMY    . TOTAL KNEE ARTHROPLASTY Right 05/17/2015   Procedure: RIGHT TOTAL KNEE ARTHROPLASTY;  Surgeon: Paralee Cancel, MD;  Location: [facility];  Service: Orthopedics;  Laterality: Right;  . TRANSTHORACIC ECHOCARDIOGRAM  11/2012   EF 60-65%, mod LVH & mod conc hypertrophy, grade 1 diastolic dysfunction; LA mildly dilated; RV systolic pressure increased; PA peak pressure 2mmHg  . TUBAL LIGATION    . UPPER GI ENDOSCOPY  01/04/2014    Procedure: UPPER GI ENDOSCOPY;  Surgeon: Greer Pickerel, MD;  Location: [facility];  Service: General;;    SOCIAL HISTORY: Social History   Tobacco Use  . Smoking status: Never Smoker  . Smokeless tobacco: Never Used  Substance Use Topics  . Alcohol use: No  . Drug use: No    FAMILY HISTORY: Family History  Problem Relation Age of Onset  . Diabetes Father   . Hyperlipidemia Father   . Hypertension Father   . Heart disease Father   . Sudden death Father   . Anxiety disorder Father   . Obesity Father   . Uterine cancer Mother   . Cervical cancer Mother   . Breast cancer Mother   . Cancer Mother        cervical and brreast cancer  . Hypertension Mother   . Hyperlipidemia Mother   . Obesity Mother   . Diabetes Maternal Grandmother     ROS: Review of Systems  Constitutional: Positive for weight loss.  Gastrointestinal: Negative for nausea and vomiting.  Musculoskeletal:       Negative muscle weakness    PHYSICAL EXAM: Blood pressure 121/78, pulse 76, height 5\' 1"  (1.549 m), weight 228 lb (103.4 kg), SpO2 94 %. Body mass index is 43.08 kg/m. Physical Exam Vitals signs reviewed.  Constitutional:      Appearance: Normal appearance. She is obese.  Cardiovascular:     Rate and Rhythm: Normal rate.     Pulses: Normal pulses.  Pulmonary:     Effort: Pulmonary effort is normal.     Breath sounds: Normal breath sounds.  Musculoskeletal: Normal range of motion.  Skin:    General: Skin is warm and dry.  Neurological:     Mental Status: She is alert and oriented to person, place, and time.  Psychiatric:        Mood and Affect: Mood normal.        Behavior: Behavior normal.     RECENT LABS AND TESTS: BMET    Component Value Date/Time  NA 144 03/06/2018 1319   K 4.6 03/06/2018 1319   CL 100 03/06/2018 1319   CO2 25 03/06/2018 1319   GLUCOSE 78 03/06/2018 1319   GLUCOSE 141 (H) 05/18/2015 0456   BUN 16 03/06/2018 1319   CREATININE 0.85 03/06/2018 1319   CALCIUM  10.5 (H) 03/06/2018 1319   GFRNONAA 73 03/06/2018 1319   GFRAA 84 03/06/2018 1319   Lab Results  Component Value Date   HGBA1C 5.5 03/06/2018   HGBA1C 5.7 (H) 12/02/2017   HGBA1C 5.5 05/02/2017   HGBA1C 5.9 (H) 01/01/2017   HGBA1C 6.1 (H) 07/02/2013   Lab Results  Component Value Date   INSULIN 6.5 03/06/2018   INSULIN 9.2 12/02/2017   INSULIN 7.9 05/02/2017   INSULIN 9.8 01/01/2017   CBC    Component Value Date/Time   WBC 7.6 01/01/2017 1335   WBC 8.0 05/18/2015 0456   RBC 4.91 01/01/2017 1335   RBC 3.29 (L) 05/18/2015 0456   HGB 13.6 01/01/2017 1335   HCT 42.6 01/01/2017 1335   PLT 117 (L) 05/18/2015 0456   MCV 87 01/01/2017 1335   MCH 27.7 01/01/2017 1335   MCH 29.2 05/18/2015 0456   MCHC 31.9 01/01/2017 1335   MCHC 32.3 05/18/2015 0456   RDW 14.7 01/01/2017 1335   LYMPHSABS 1.9 01/01/2017 1335   MONOABS 0.8 03/26/2015 0316   EOSABS 0.2 01/01/2017 1335   BASOSABS 0.1 01/01/2017 1335   Iron/TIBC/Ferritin/ %Sat No results found for: IRON, TIBC, FERRITIN, IRONPCTSAT Lipid Panel     Component Value Date/Time   CHOL 179 03/06/2018 1319   TRIG 84 03/06/2018 1319   HDL 64 03/06/2018 1319   CHOLHDL 2.8 03/06/2018 1319   CHOLHDL 4.2 07/02/2013 1301   VLDL 43 (H) 07/02/2013 1301   LDLCALC 98 03/06/2018 1319   Hepatic Function Panel     Component Value Date/Time   PROT 7.6 03/06/2018 1319   ALBUMIN 4.6 03/06/2018 1319   AST 19 03/06/2018 1319   ALT 17 03/06/2018 1319   ALKPHOS 85 03/06/2018 1319   BILITOT 0.5 03/06/2018 1319      Component Value Date/Time   TSH 3.580 03/06/2018 1319   TSH 3.090 05/02/2017 0850   TSH 2.350 01/01/2017 1335      OBESITY BEHAVIORAL INTERVENTION VISIT  Today's visit was # 22   Starting weight: 267 lbs Starting date: 01/01/17 Today's weight : 228 lbs  Today's date: 06/10/2018 Total lbs lost to date: 39    06/10/2018  Height 5\' 1"  (1.549 m)  Weight 228 lb (103.4 kg)  BMI (Calculated) 43.1  BLOOD PRESSURE - SYSTOLIC  332  BLOOD PRESSURE - DIASTOLIC 78   Body Fat % 54 %     ASK: We discussed the diagnosis of obesity with Rebekah [name] today and Rebekah [name] agreed to give [name] permission to discuss obesity behavioral modification therapy today.  ASSESS: Tahjanae has the diagnosis of obesity and her BMI today is 43.1 Rebekah [name] is in the action stage of change   ADVISE: Rebekah [name] was educated on the multiple health risks of obesity as well as the benefit of weight loss to improve her health. She was advised of the need for long term treatment and the importance of lifestyle modifications to improve her current health and to decrease her risk of future health problems.  AGREE: Multiple dietary modification options and treatment options were discussed and  Rebekah [name] agreed to follow the recommendations documented in the above note.  ARRANGE: Rebekah [name] was educated on the importance  of frequent visits to treat obesity as outlined per CMS and USPSTF guidelines and agreed to schedule her next follow up appointment today.  I, Trixie Dredge, am acting as transcriptionist for Dennard Nip, MD  I have reviewed the above documentation for accuracy and completeness, and I agree with the above. -Dennard Nip, MD

## 2018-06-23 ENCOUNTER — Encounter: Payer: Self-pay | Admitting: Physician Assistant

## 2018-06-23 LAB — HM MAMMOGRAPHY

## 2018-06-26 ENCOUNTER — Encounter: Payer: Self-pay | Admitting: Physician Assistant

## 2018-06-27 ENCOUNTER — Other Ambulatory Visit: Payer: Self-pay | Admitting: Physician Assistant

## 2018-06-27 MED ORDER — ALBUTEROL SULFATE HFA 108 (90 BASE) MCG/ACT IN AERS: 2.0000 | INHALATION_SPRAY | Freq: Four times a day (QID) | RESPIRATORY_TRACT | 0 refills | Status: DC | PRN

## 2018-06-27 MED ORDER — AMOXICILLIN 500 MG PO CAPS: 500.0000 mg | ORAL_CAPSULE | Freq: Three times a day (TID) | ORAL | 0 refills | Status: DC

## 2018-06-27 MED ORDER — METHYLPREDNISOLONE 4 MG PO TBPK: ORAL_TABLET | ORAL | 0 refills | Status: DC

## 2018-06-30 NOTE — Progress Notes (Unsigned)
a 

## 2018-07-07 ENCOUNTER — Ambulatory Visit (INDEPENDENT_AMBULATORY_CARE_PROVIDER_SITE_OTHER): Payer: BC Managed Care – PPO | Admitting: Family Medicine

## 2018-07-08 ENCOUNTER — Other Ambulatory Visit: Payer: Self-pay | Admitting: Physician Assistant

## 2018-07-09 ENCOUNTER — Other Ambulatory Visit: Payer: Self-pay | Admitting: Physician Assistant

## 2018-07-09 ENCOUNTER — Telehealth: Payer: Self-pay | Admitting: Physician Assistant

## 2018-07-09 MED ORDER — LEVOTHYROXINE SODIUM 50 MCG PO TABS: ORAL_TABLET | ORAL | 0 refills | Status: DC

## 2018-07-09 NOTE — Telephone Encounter (Signed)
Patient notified that levothyroxine was sent in for 3 months.  Scheduled her for appointment with Particia Nearing, PA 10/10/2018.

## 2018-07-09 NOTE — Telephone Encounter (Signed)
PT is wanting to talk to [place] about her apt she has next week with [facility]. States it is time for her levothyroxine (SYNTHROID, LEVOTHROID) 50 MCG tablet  To get filled, she recently had labs done with Dr Leafy Ro (in [facility]). PT is not sure if she needs to come in and see AJ or can she do a virtual visit. Pt states she had Bronchitis 2 weeks ago, still has cough, post nasal drip from allergies.

## 2018-07-09 NOTE — Telephone Encounter (Signed)
I will send a refill for the levothyroxine for the next 3 months.  Please go ahead and make an appointment to come and see me and to get labs performed that day.

## 2018-07-10 ENCOUNTER — Ambulatory Visit (INDEPENDENT_AMBULATORY_CARE_PROVIDER_SITE_OTHER): Payer: BC Managed Care – PPO | Admitting: Family Medicine

## 2018-07-10 ENCOUNTER — Other Ambulatory Visit: Payer: Self-pay

## 2018-07-10 ENCOUNTER — Encounter (INDEPENDENT_AMBULATORY_CARE_PROVIDER_SITE_OTHER): Payer: Self-pay

## 2018-07-10 ENCOUNTER — Encounter (INDEPENDENT_AMBULATORY_CARE_PROVIDER_SITE_OTHER): Payer: Self-pay | Admitting: Family Medicine

## 2018-07-10 ENCOUNTER — Encounter: Payer: Self-pay | Admitting: Physician Assistant

## 2018-07-10 DIAGNOSIS — I10 Essential (primary) hypertension: Secondary | ICD-10-CM | POA: Diagnosis not present

## 2018-07-10 DIAGNOSIS — E559 Vitamin D deficiency, unspecified: Secondary | ICD-10-CM

## 2018-07-10 DIAGNOSIS — E66813 Obesity, class 3: Secondary | ICD-10-CM

## 2018-07-10 DIAGNOSIS — E038 Other specified hypothyroidism: Secondary | ICD-10-CM | POA: Diagnosis not present

## 2018-07-10 DIAGNOSIS — Z6841 Body Mass Index (BMI) 40.0 and over, adult: Secondary | ICD-10-CM

## 2018-07-14 ENCOUNTER — Ambulatory Visit: Payer: BC Managed Care – PPO | Admitting: Physician Assistant

## 2018-07-14 NOTE — Progress Notes (Signed)
Office: [phone]  /  Fax: [phone] TeleHealth Visit:  Rebekah [name] has consented to this TeleHealth visit today via audio. The patient is located at home, the provider is located at the [facility] office. The participants in this visit include the listed provider and patient and provider's assistant.   HPI:   Chief Complaint: OBESITY Rebekah [name] is here to discuss her progress with her obesity treatment plan. She is on the [employer] plan and is following her eating plan approximately 98 % of the time. She states she is exercising 0 minutes 0 times per week. Rebekah [name] has been sick since the end of February with bronchitis. She has been caring for 2 baby goats and is planning to help her daugther move at the end of her next appointment. She feels successful on the low carbohydrate/Atkins plan. She is cravings pizza fairly intensely.  We were unable to weight the patient today for this TeleHealth visit. She feels as if she has lost 7 lbs since her last visit. She has lost 39-46 lbs since starting treatment with [name].  Vitamin D deficiency Rebekah [name] has a diagnosis of vitamin D deficiency. She is currently taking prescription Vit D. She notes fatigue and denies nausea, vomiting or muscle weakness.  Hypertension Rebekah [name] is a 63 y.o. female with hypertension. Rebekah [name]'s blood pressure is controlled. She states her blood pressure is running at 128/76. She denies chest pain, chest pressure, or headaches. She is working on weight loss to help control her blood pressure with the goal of decreasing her risk of heart attack and stroke.  Hypothyroidism Rebekah [name] has a diagnosis of hypothyroidism. She is on levothyroxine. She denies hot or cold intolerance or palpitations, but does admit to ongoing fatigue.  ASSESSMENT AND PLAN:  Vitamin D deficiency - Plan: VITAMIN D 25 Hydroxy (Vit-D Deficiency, Fractures), Vitamin D, Ergocalciferol, (DRISDOL) 1.25 MG (50000 UT) CAPS capsule  Essential  hypertension  Other specified hypothyroidism - Plan: Hemoglobin A1c, Insulin, random, Vitamin B12, TSH  Class 3 severe obesity with serious comorbidity and body mass index (BMI) of 40.0 to 44.9 in adult, unspecified obesity type ([place])  PLAN:  Vitamin D Deficiency Rebekah [name] was informed that low vitamin D levels contributes to fatigue and are associated with obesity, breast, and colon cancer. Rebekah [name] agrees to continue taking prescription Vit D @50 ,000 IU every week #4 and we will refill for 1 month. She will follow up for routine testing of vitamin D, at least 2-3 times per year. She was informed of the risk of over-replacement of vitamin D and agrees to not increase her dose unless she discusses this with [name] first. We will check Vit D, Vit B12, Vit D1 and D2 today. Rebekah [name] agrees to follow up with our clinic in 5 weeks.  Hypertension We discussed sodium restriction, working on healthy weight loss, and a regular exercise program as the means to achieve improved blood pressure control. Rebekah [name] agreed with this plan and agreed to follow up as directed. We will continue to monitor her blood pressure as well as her progress with the above lifestyle modifications. Rebekah [name] agrees to continue her current medications and will watch for signs of hypotension as she continues her lifestyle modifications. Rebekah [name] agrees to follow up with our clinic in 5 weeks.  Hypothyroidism Rebekah [name] was informed of the importance of good thyroid control to help with weight loss efforts. She was also informed that supertheraputic thyroid levels are dangerous and will not improve weight loss results. We  will check TSH today. Rebekah [name] agrees to follow up with our clinic in 5 weeks.  Obesity Rebekah [name] is currently in the action stage of change. As such, her goal is to continue with weight loss efforts She has agreed to [place] has been instructed to work up to a goal of 150 minutes of combined cardio and strengthening exercise per week for  weight loss and overall health benefits. We discussed the following Behavioral Modification Strategies today: increasing lean protein intake, increasing vegetables, work on meal planning and easy cooking plans, and planning for success   Rebekah [name] has agreed to follow up with our clinic in 5 weeks. She was informed of the importance of frequent follow up visits to maximize her success with intensive lifestyle modifications for her multiple health conditions.  ALLERGIES: Allergies  Allergen Reactions  . Beef-Derived Products Anaphylaxis    After tick bite, cannot eat beef, pork or lamb  . Lambs Quarters Anaphylaxis    After tick bite cannot eat beef, pork or lamb  . Mucinex [Guaifenesin Er] Anaphylaxis  . Pork-Derived Products Anaphylaxis    After tick bite cannot eat beef, pork or lamb  . Darvon [Propoxyphene] Other (See Comments)    Hallucinations   . Ace Inhibitors Swelling and Cough    Pedal Edema  . Hydromet [Hydrocodone-Homatropine] Itching and Other (See Comments)    Severe stomach pain-face itches.   . Sulfonamide Derivatives Rash    MEDICATIONS: Current Outpatient Medications on File Prior to Visit  Medication Sig Dispense Refill  . acetaminophen (TYLENOL) 500 MG tablet Take 500-1,000 mg by mouth every 6 (six) hours as needed for moderate pain or headache.    . albuterol (PROVENTIL HFA;VENTOLIN HFA) 108 (90 Base) MCG/ACT inhaler Inhale 2 puffs into the lungs every 6 (six) hours as needed for wheezing or shortness of breath. 1 Inhaler 0  . amoxicillin (AMOXIL) 500 MG capsule Take 1 capsule (500 mg total) by mouth 3 (three) times daily. 30 capsule 0  . CALCIUM PO Take 500 mg by mouth daily.     Marland Kitchen CARTIA XT 120 MG 24 hr capsule TAKE 1 CAPSULE BY MOUTH ONCE DAILY (NEEDS  APPOINTMENT  FOR  REFILLS) 90 capsule 2  . cetirizine (ZYRTEC) 10 MG tablet Take 10 mg by mouth at bedtime.     Marland Kitchen desmopressin (DDAVP) 0.2 MG tablet Take 0.6 mg by mouth at bedtime.    Marland Kitchen diltiazem (CARDIZEM) 30 MG  tablet TAKE ONE TO TWO TABLETS BY MOUTH EVERY 6 HOURS AS NEEDED FOR  FAST  HEART  RATES 90 tablet 0  . EPINEPHrine (EPIPEN) 0.3 mg/0.3 mL IJ SOAJ injection Inject 0.3 mLs (0.3 mg total) into the muscle once as needed (anaphylaxis). 1 Device 6  . flecainide (TAMBOCOR) 100 MG tablet Take 3 tablets by mouth at onset of fast heart rate as needed. 270 tablet 0  . furosemide (LASIX) 20 MG tablet Take 1 tablet (20 mg total) by mouth daily as needed for edema. 30 tablet 3  . levothyroxine (SYNTHROID, LEVOTHROID) 50 MCG tablet TAKE 1 TABLET BY MOUTH ONCE DAILY BEFORE BREAKFAST 90 tablet 0  . losartan (COZAAR) 50 MG tablet Take 1 tablet (50 mg total) by mouth 2 (two) times daily. 180 tablet 3  . methylPREDNISolone (MEDROL DOSEPAK) 4 MG TBPK tablet Take med as directed in a taper fashion 21 tablet 0  . Multiple Vitamins-Minerals (MULTIVITAMIN ADULT PO) Take 1 capsule by mouth 2 (two) times daily. Journey 3+3 Bariatric Multivitamin    .  oxybutynin (DITROPAN-XL) 10 MG 24 hr tablet Take 10 mg by mouth at bedtime.    . OXYGEN Inhale 3 L/min into the lungs at bedtime. Continuously    . rivaroxaban (XARELTO) 20 MG TABS tablet TAKE 1 TABLET BY MOUTH ONCE DAILY WITH SUPPER 90 tablet 1  . traMADol (ULTRAM) 50 MG tablet Take 1 tablet (50 mg total) by mouth every 6 (six) hours as needed. 30 tablet 5  . Vitamin D, Ergocalciferol, (DRISDOL) 1.25 MG (50000 UT) CAPS capsule Take 1 capsule (50,000 Units total) by mouth every 7 (seven) days. 4 capsule 0   No current facility-administered medications on file prior to visit.     PAST MEDICAL HISTORY: Past Medical History:  Diagnosis Date  . Anxiety   . Arthritis   . Bilateral bunions   . Complication of anesthesia   . Difficulty sleeping    prior sleep study did not reveal sleep apnea per patient  . DJD (degenerative joint disease)   . Dyspnea   . Dysrhythmia    a-fib  . Environmental allergies   . Food allergy   . Gallbladder problem   . GERD (gastroesophageal  reflux disease)   . Heart murmur   . Hypertension    no meds after weight loss  . Hypothyroidism   . Incontinence of urine    at nite   . Joint pain   . Left atrial dilatation   . Left foot pain   . Leg edema   . Obesity    s/p gastric sleeve 12/2013 (previously weighed close to 400 lbs)  . Osteoarthritis   . Palpitations   . Paroxysmal atrial fibrillation (HCC)   . Pneumonia    hx of   . PONV (postoperative nausea and vomiting)   . Status post bilateral knee replacements   . Supplemental oxygen dependent    uses 2l/[place] at night, states HR goes low and O2 drops  . Tuberculosis    had 6 month of INH due to exposure   . Vaginal vault prolapse     PAST SURGICAL HISTORY: Past Surgical History:  Procedure Laterality Date  . APPENDECTOMY    . CHOLECYSTECTOMY    . EYE SURGERY     03/2014 lens implant left lens   . FOOT ARTHRODESIS Left 03/15/2016   Procedure: TALONAVICULAR AND SUBTALAR ARTHRODESIS;  Surgeon: Wylene Simmer, MD;  Location: [facility];  Service: Orthopedics;  Laterality: Left;  Marland Kitchen GASTROC RECESSION EXTREMITY Left 03/15/2016   Procedure: LEFT GASTROC RECESSION;  Surgeon: Wylene Simmer, MD;  Location: [facility];  Service: Orthopedics;  Laterality: Left;  . JOINT REPLACEMENT  1999   L TOTAL KNEE  . LAPAROSCOPIC GASTRIC SLEEVE RESECTION N/A 01/04/2014   Procedure: LAPAROSCOPIC GASTRIC SLEEVE RESECTION;  Surgeon: Greer Pickerel, MD;  Location: [facility];  Service: General;  Laterality: N/A;  . TONSILLECTOMY    . TOTAL KNEE ARTHROPLASTY Right 05/17/2015   Procedure: RIGHT TOTAL KNEE ARTHROPLASTY;  Surgeon: Paralee Cancel, MD;  Location: [facility];  Service: Orthopedics;  Laterality: Right;  . TRANSTHORACIC ECHOCARDIOGRAM  11/2012   EF 60-65%, mod LVH & mod conc hypertrophy, grade 1 diastolic dysfunction; LA mildly dilated; RV systolic pressure increased; PA peak pressure 28mmHg  . TUBAL LIGATION    . UPPER GI ENDOSCOPY  01/04/2014   Procedure: UPPER GI  ENDOSCOPY;  Surgeon: Greer Pickerel, MD;  Location: [facility];  Service: General;;    SOCIAL HISTORY: Social History   Tobacco Use  . Smoking  status: Never Smoker  . Smokeless tobacco: Never Used  Substance Use Topics  . Alcohol use: No  . Drug use: No    FAMILY HISTORY: Family History  Problem Relation Age of Onset  . Diabetes Father   . Hyperlipidemia Father   . Hypertension Father   . Heart disease Father   . Sudden death Father   . Anxiety disorder Father   . Obesity Father   . Uterine cancer Mother   . Cervical cancer Mother   . Breast cancer Mother   . Cancer Mother        cervical and brreast cancer  . Hypertension Mother   . Hyperlipidemia Mother   . Obesity Mother   . Diabetes Maternal Grandmother     ROS: Review of Systems  Constitutional: Positive for malaise/fatigue and weight loss.  Cardiovascular: Negative for chest pain and palpitations.       Negative chest pressure  Gastrointestinal: Negative for nausea and vomiting.  Musculoskeletal:       Negative muscle weakness  Neurological: Negative for headaches.  Endo/Heme/Allergies:       Negative hot/cold intolerance    PHYSICAL EXAM: Pt in no acute distress  RECENT LABS AND TESTS: BMET    Component Value Date/Time   NA 144 03/06/2018 1319   K 4.6 03/06/2018 1319   CL 100 03/06/2018 1319   CO2 25 03/06/2018 1319   GLUCOSE 78 03/06/2018 1319   GLUCOSE 141 (H) 05/18/2015 0456   BUN 16 03/06/2018 1319   CREATININE 0.85 03/06/2018 1319   CALCIUM 10.5 (H) 03/06/2018 1319   GFRNONAA 73 03/06/2018 1319   GFRAA 84 03/06/2018 1319   Lab Results  Component Value Date   HGBA1C 5.5 03/06/2018   HGBA1C 5.7 (H) 12/02/2017   HGBA1C 5.5 05/02/2017   HGBA1C 5.9 (H) 01/01/2017   HGBA1C 6.1 (H) 07/02/2013   Lab Results  Component Value Date   INSULIN 6.5 03/06/2018   INSULIN 9.2 12/02/2017   INSULIN 7.9 05/02/2017   INSULIN 9.8 01/01/2017   CBC    Component Value Date/Time   WBC 7.6 01/01/2017  1335   WBC 8.0 05/18/2015 0456   RBC 4.91 01/01/2017 1335   RBC 3.29 (L) 05/18/2015 0456   HGB 13.6 01/01/2017 1335   HCT 42.6 01/01/2017 1335   PLT 117 (L) 05/18/2015 0456   MCV 87 01/01/2017 1335   MCH 27.7 01/01/2017 1335   MCH 29.2 05/18/2015 0456   MCHC 31.9 01/01/2017 1335   MCHC 32.3 05/18/2015 0456   RDW 14.7 01/01/2017 1335   LYMPHSABS 1.9 01/01/2017 1335   MONOABS 0.8 03/26/2015 0316   EOSABS 0.2 01/01/2017 1335   BASOSABS 0.1 01/01/2017 1335   Iron/TIBC/Ferritin/ %Sat No results found for: IRON, TIBC, FERRITIN, IRONPCTSAT Lipid Panel     Component Value Date/Time   CHOL 179 03/06/2018 1319   TRIG 84 03/06/2018 1319   HDL 64 03/06/2018 1319   CHOLHDL 2.8 03/06/2018 1319   CHOLHDL 4.2 07/02/2013 1301   VLDL 43 (H) 07/02/2013 1301   LDLCALC 98 03/06/2018 1319   Hepatic Function Panel     Component Value Date/Time   PROT 7.6 03/06/2018 1319   ALBUMIN 4.6 03/06/2018 1319   AST 19 03/06/2018 1319   ALT 17 03/06/2018 1319   ALKPHOS 85 03/06/2018 1319   BILITOT 0.5 03/06/2018 1319      Component Value Date/Time   TSH 3.580 03/06/2018 1319   TSH 3.090 05/02/2017 0850   TSH 2.350 01/01/2017 1335  I, Trixie Dredge, am acting as transcriptionist for Ilene Qua, MD  I have reviewed the above documentation for accuracy and completeness, and I agree with the above. - Ilene Qua, MD

## 2018-07-15 ENCOUNTER — Other Ambulatory Visit: Payer: Self-pay | Admitting: Physician Assistant

## 2018-07-15 MED ORDER — TRAMADOL HCL 50 MG PO TABS: 50.0000 mg | ORAL_TABLET | Freq: Four times a day (QID) | ORAL | 1 refills | Status: DC | PRN

## 2018-07-30 LAB — HEMOGLOBIN A1C
Est. average glucose Bld gHb Est-mCnc: 111 mg/dL
Hgb A1c MFr Bld: 5.5 % (ref 4.8–5.6)

## 2018-07-30 LAB — INSULIN, RANDOM: INSULIN: 9.8 u[IU]/mL (ref 2.6–24.9)

## 2018-07-30 LAB — VITAMIN D 25 HYDROXY (VIT D DEFICIENCY, FRACTURES): Vit D, 25-Hydroxy: 32.1 ng/mL (ref 30.0–100.0)

## 2018-07-30 LAB — TSH: TSH: 3.74 u[IU]/mL (ref 0.450–4.500)

## 2018-07-30 LAB — VITAMIN B12: Vitamin B-12: 736 pg/mL (ref 232–1245)

## 2018-07-30 MED ORDER — VITAMIN D (ERGOCALCIFEROL) 1.25 MG (50000 UNIT) PO CAPS: 50000.0000 [IU] | ORAL_CAPSULE | ORAL | 0 refills | Status: DC

## 2018-08-11 ENCOUNTER — Ambulatory Visit (INDEPENDENT_AMBULATORY_CARE_PROVIDER_SITE_OTHER): Payer: BC Managed Care – PPO | Admitting: Family Medicine

## 2018-08-11 ENCOUNTER — Encounter (INDEPENDENT_AMBULATORY_CARE_PROVIDER_SITE_OTHER): Payer: Self-pay | Admitting: Family Medicine

## 2018-08-11 ENCOUNTER — Other Ambulatory Visit: Payer: Self-pay

## 2018-08-11 DIAGNOSIS — E559 Vitamin D deficiency, unspecified: Secondary | ICD-10-CM

## 2018-08-11 DIAGNOSIS — F418 Other specified anxiety disorders: Secondary | ICD-10-CM

## 2018-08-11 DIAGNOSIS — Z6841 Body Mass Index (BMI) 40.0 and over, adult: Secondary | ICD-10-CM

## 2018-08-11 MED ORDER — ESCITALOPRAM OXALATE 10 MG PO TABS: 10.0000 mg | ORAL_TABLET | ORAL | 0 refills | Status: DC

## 2018-08-11 MED ORDER — VITAMIN D (ERGOCALCIFEROL) 1.25 MG (50000 UNIT) PO CAPS: 50000.0000 [IU] | ORAL_CAPSULE | ORAL | 0 refills | Status: DC

## 2018-08-11 NOTE — Progress Notes (Signed)
Office: [phone]  /  Fax: [phone] TeleHealth Visit:  Rebekah [name] has verbally consented to this TeleHealth visit today. The patient is located at home, the provider is located at the [facility] office. The participants in this visit include the listed provider and patient. Nariya was unable to use Face Time today and the telehealth visit was conducted via telephone.  HPI:   Chief Complaint: OBESITY Rebekah [name] is here to discuss her progress with her obesity treatment plan. She is on the Atkins diet and is following her eating plan approximately 75 to 80 % of the time. She states she is gardening. Rebekah [name] has done well maintaining her weight. She is sewing face masks to keep herself occupied and helping to avoid boredom snacking. She had gained weight while traveling previously, but has tried to get back on track.  We were unable to weigh the patient today for this TeleHealth visit. She feels as if she has maintained weight since her last visit. She has lost 39 lbs since starting treatment with [name].  Vitamin D Deficiency Rebekah [name] has a diagnosis of vitamin D deficiency. She is currently stable on vit D. Rebekah [name] denies nausea, vomiting, or muscle weakness.  Depression with Anxiety Rebekah [name] stopped her Lexapro approximately 3 months ago. She had done well mood wise until the [place] pandemic. She shows no sign of suicidal or homicidal ideations, but her mood has decreased and her stress eating has increased. She has been working on behavior modification techniques to help reduce her emotional eating and has been somewhat successful.   Depression screen Rebekah [name] - Rebekah [name] 2/9 07/17/2017 01/01/2017 04/20/2016 04/05/2016 12/27/2015  Decreased Interest 0 2 0 0 0  Down, Depressed, Hopeless 0 1 0 0 0  PHQ - 2 Score 0 3 0 0 0  Altered sleeping - 0 - - -  Tired, decreased energy - 2 - - -  Change in appetite - 1 - - -  Feeling bad or failure about yourself  - 3 - - -  Trouble concentrating - 0 - - -  Moving slowly  or fidgety/restless - 3 - - -  Suicidal thoughts - 0 - - -  PHQ-9 Score - 12 - - -  Difficult doing work/chores - Very difficult - - -   ASSESSMENT AND PLAN:  Vitamin D deficiency - Plan: Vitamin D, Ergocalciferol, (DRISDOL) 1.25 MG (50000 UT) CAPS capsule  Depression with anxiety - Plan: escitalopram (LEXAPRO) 10 MG tablet  Class 3 severe obesity with serious comorbidity and body mass index (BMI) of 40.0 to 44.9 in adult, unspecified obesity type (HCC)  PLAN:  Vitamin D Deficiency Jil was informed that low vitamin D levels contribute to fatigue and are associated with obesity, breast, and colon cancer. Faithanne agrees to continue to take prescription Vit D @50 ,000 IU every week #4 with no refills and will follow up for routine testing of vitamin D, at least 2-3 times per year. She was informed of the risk of over-replacement of vitamin D and agrees to not increase her dose unless she discusses this with [name] first. Samoria agrees to follow up in 4 weeks as directed.  Depression with Emotional Eating Behaviors We discussed behavior modification techniques today to help Rebekah [name] deal with her emotional eating and depression. She has agreed to restart Lexapro 10 mg qd #30 with no refills and agreed to follow up as directed.  Obesity Rebekah [name] is currently in the action stage of change. As such, her goal is to continue  with weight loss efforts. She has agreed to continue with the Atkins diet. Rebekah [name] has been instructed to work up to a goal of 150 minutes of combined cardio and strengthening exercise per week for weight loss and overall health benefits. We discussed the following Behavioral Modification Strategies today: increasing lean protein intake and increasing vegetables.  Rebekah [name] has agreed to follow up with our clinic in 4 weeks. She was informed of the importance of frequent follow up visits to maximize her success with intensive lifestyle modifications for her multiple health conditions.  ALLERGIES:  Allergies  Allergen Reactions  . Beef-Derived Products Anaphylaxis    After tick bite, cannot eat beef, pork or lamb  . Lambs Quarters Anaphylaxis    After tick bite cannot eat beef, pork or lamb  . Mucinex [Guaifenesin Er] Anaphylaxis  . Pork-Derived Products Anaphylaxis    After tick bite cannot eat beef, pork or lamb  . Darvon [Propoxyphene] Other (See Comments)    Hallucinations   . Ace Inhibitors Swelling and Cough    Pedal Edema  . Hydromet [Hydrocodone-Homatropine] Itching and Other (See Comments)    Severe stomach pain-face itches.   . Sulfonamide Derivatives Rash    MEDICATIONS: Current Outpatient Medications on File Prior to Visit  Medication Sig Dispense Refill  . acetaminophen (TYLENOL) 500 MG tablet Take 500-1,000 mg by mouth every 6 (six) hours as needed for moderate pain or headache.    . albuterol (PROVENTIL HFA;VENTOLIN HFA) 108 (90 Base) MCG/ACT inhaler Inhale 2 puffs into the lungs every 6 (six) hours as needed for wheezing or shortness of breath. 1 Inhaler 0  . amoxicillin (AMOXIL) 500 MG capsule Take 1 capsule (500 mg total) by mouth 3 (three) times daily. 30 capsule 0  . CALCIUM PO Take 500 mg by mouth daily.     Marland Kitchen CARTIA XT 120 MG 24 hr capsule TAKE 1 CAPSULE BY MOUTH ONCE DAILY (NEEDS  APPOINTMENT  FOR  REFILLS) 90 capsule 2  . cetirizine (ZYRTEC) 10 MG tablet Take 10 mg by mouth at bedtime.     Marland Kitchen desmopressin (DDAVP) 0.2 MG tablet Take 0.6 mg by mouth at bedtime.    Marland Kitchen diltiazem (CARDIZEM) 30 MG tablet TAKE ONE TO TWO TABLETS BY MOUTH EVERY 6 HOURS AS NEEDED FOR  FAST  HEART  RATES 90 tablet 0  . EPINEPHrine (EPIPEN) 0.3 mg/0.3 mL IJ SOAJ injection Inject 0.3 mLs (0.3 mg total) into the muscle once as needed (anaphylaxis). 1 Device 6  . flecainide (TAMBOCOR) 100 MG tablet Take 3 tablets by mouth at onset of fast heart rate as needed. 270 tablet 0  . furosemide (LASIX) 20 MG tablet Take 1 tablet (20 mg total) by mouth daily as needed for edema. 30 tablet 3  .  levothyroxine (SYNTHROID, LEVOTHROID) 50 MCG tablet TAKE 1 TABLET BY MOUTH ONCE DAILY BEFORE BREAKFAST 90 tablet 0  . losartan (COZAAR) 50 MG tablet Take 1 tablet (50 mg total) by mouth 2 (two) times daily. 180 tablet 3  . methylPREDNISolone (MEDROL DOSEPAK) 4 MG TBPK tablet Take med as directed in a taper fashion 21 tablet 0  . Multiple Vitamins-Minerals (MULTIVITAMIN ADULT PO) Take 1 capsule by mouth 2 (two) times daily. Journey 3+3 Bariatric Multivitamin    . oxybutynin (DITROPAN-XL) 10 MG 24 hr tablet Take 10 mg by mouth at bedtime.    . OXYGEN Inhale 3 L/min into the lungs at bedtime. Continuously    . rivaroxaban (XARELTO) 20 MG TABS tablet TAKE 1 TABLET  BY MOUTH ONCE DAILY WITH SUPPER 90 tablet 1  . traMADol (ULTRAM) 50 MG tablet Take 1 tablet (50 mg total) by mouth every 6 (six) hours as needed. 30 tablet 1   No current facility-administered medications on file prior to visit.     PAST MEDICAL HISTORY: Past Medical History:  Diagnosis Date  . Anxiety   . Arthritis   . Bilateral bunions   . Complication of anesthesia   . Difficulty sleeping    prior sleep study did not reveal sleep apnea per patient  . DJD (degenerative joint disease)   . Dyspnea   . Dysrhythmia    a-fib  . Environmental allergies   . Food allergy   . Gallbladder problem   . GERD (gastroesophageal reflux disease)   . Heart murmur   . Hypertension    no meds after weight loss  . Hypothyroidism   . Incontinence of urine    at nite   . Joint pain   . Left atrial dilatation   . Left foot pain   . Leg edema   . Obesity    s/p gastric sleeve 12/2013 (previously weighed close to 400 lbs)  . Osteoarthritis   . Palpitations   . Paroxysmal atrial fibrillation (HCC)   . Pneumonia    hx of   . PONV (postoperative nausea and vomiting)   . Status post bilateral knee replacements   . Supplemental oxygen dependent    uses 2l/[place] at night, states HR goes low and O2 drops  . Tuberculosis    had 6 month of INH  due to exposure   . Vaginal vault prolapse     PAST SURGICAL HISTORY: Past Surgical History:  Procedure Laterality Date  . APPENDECTOMY    . CHOLECYSTECTOMY    . EYE SURGERY     03/2014 lens implant left lens   . FOOT ARTHRODESIS Left 03/15/2016   Procedure: TALONAVICULAR AND SUBTALAR ARTHRODESIS;  Surgeon: Wylene Simmer, MD;  Location: [facility];  Service: Orthopedics;  Laterality: Left;  Marland Kitchen GASTROC RECESSION EXTREMITY Left 03/15/2016   Procedure: LEFT GASTROC RECESSION;  Surgeon: Wylene Simmer, MD;  Location: [facility];  Service: Orthopedics;  Laterality: Left;  . JOINT REPLACEMENT  1999   L TOTAL KNEE  . LAPAROSCOPIC GASTRIC SLEEVE RESECTION N/A 01/04/2014   Procedure: LAPAROSCOPIC GASTRIC SLEEVE RESECTION;  Surgeon: Greer Pickerel, MD;  Location: [facility];  Service: General;  Laterality: N/A;  . TONSILLECTOMY    . TOTAL KNEE ARTHROPLASTY Right 05/17/2015   Procedure: RIGHT TOTAL KNEE ARTHROPLASTY;  Surgeon: Paralee Cancel, MD;  Location: [facility];  Service: Orthopedics;  Laterality: Right;  . TRANSTHORACIC ECHOCARDIOGRAM  11/2012   EF 60-65%, mod LVH & mod conc hypertrophy, grade 1 diastolic dysfunction; LA mildly dilated; RV systolic pressure increased; PA peak pressure 68mmHg  . TUBAL LIGATION    . UPPER GI ENDOSCOPY  01/04/2014   Procedure: UPPER GI ENDOSCOPY;  Surgeon: Greer Pickerel, MD;  Location: [facility];  Service: General;;    SOCIAL HISTORY: Social History   Tobacco Use  . Smoking status: Never Smoker  . Smokeless tobacco: Never Used  Substance Use Topics  . Alcohol use: No  . Drug use: No    FAMILY HISTORY: Family History  Problem Relation Age of Onset  . Diabetes Father   . Hyperlipidemia Father   . Hypertension Father   . Heart disease Father   . Sudden death Father   . Anxiety disorder Father   .  Obesity Father   . Uterine cancer Mother   . Cervical cancer Mother   . Breast cancer Mother   . Cancer Mother        cervical and brreast  cancer  . Hypertension Mother   . Hyperlipidemia Mother   . Obesity Mother   . Diabetes Maternal Grandmother     ROS: Review of Systems  Gastrointestinal: Negative for nausea and vomiting.  Musculoskeletal:       Negative for muscle weakness.  Psychiatric/Behavioral: Positive for depression. Negative for suicidal ideas. The patient is nervous/anxious.        Negative for homicidal ideations.    PHYSICAL EXAM: Pt in no acute distress  RECENT LABS AND TESTS: BMET    Component Value Date/Time   NA 144 03/06/2018 1319   K 4.6 03/06/2018 1319   CL 100 03/06/2018 1319   CO2 25 03/06/2018 1319   GLUCOSE 78 03/06/2018 1319   GLUCOSE 141 (H) 05/18/2015 0456   BUN 16 03/06/2018 1319   CREATININE 0.85 03/06/2018 1319   CALCIUM 10.5 (H) 03/06/2018 1319   GFRNONAA 73 03/06/2018 1319   GFRAA 84 03/06/2018 1319   Lab Results  Component Value Date   HGBA1C 5.5 07/29/2018   HGBA1C 5.5 03/06/2018   HGBA1C 5.7 (H) 12/02/2017   HGBA1C 5.5 05/02/2017   HGBA1C 5.9 (H) 01/01/2017   Lab Results  Component Value Date   INSULIN 9.8 07/29/2018   INSULIN 6.5 03/06/2018   INSULIN 9.2 12/02/2017   INSULIN 7.9 05/02/2017   INSULIN 9.8 01/01/2017   CBC    Component Value Date/Time   WBC 7.6 01/01/2017 1335   WBC 8.0 05/18/2015 0456   RBC 4.91 01/01/2017 1335   RBC 3.29 (L) 05/18/2015 0456   HGB 13.6 01/01/2017 1335   HCT 42.6 01/01/2017 1335   PLT 117 (L) 05/18/2015 0456   MCV 87 01/01/2017 1335   MCH 27.7 01/01/2017 1335   MCH 29.2 05/18/2015 0456   MCHC 31.9 01/01/2017 1335   MCHC 32.3 05/18/2015 0456   RDW 14.7 01/01/2017 1335   LYMPHSABS 1.9 01/01/2017 1335   MONOABS 0.8 03/26/2015 0316   EOSABS 0.2 01/01/2017 1335   BASOSABS 0.1 01/01/2017 1335   Iron/TIBC/Ferritin/ %Sat No results found for: IRON, TIBC, FERRITIN, IRONPCTSAT Lipid Panel     Component Value Date/Time   CHOL 179 03/06/2018 1319   TRIG 84 03/06/2018 1319   HDL 64 03/06/2018 1319   CHOLHDL 2.8  03/06/2018 1319   CHOLHDL 4.2 07/02/2013 1301   VLDL 43 (H) 07/02/2013 1301   LDLCALC 98 03/06/2018 1319   Hepatic Function Panel     Component Value Date/Time   PROT 7.6 03/06/2018 1319   ALBUMIN 4.6 03/06/2018 1319   AST 19 03/06/2018 1319   ALT 17 03/06/2018 1319   ALKPHOS 85 03/06/2018 1319   BILITOT 0.5 03/06/2018 1319      Component Value Date/Time   TSH 3.740 07/29/2018 0906   TSH 3.580 03/06/2018 1319   TSH 3.090 05/02/2017 0850   Results for Freimark, Rebekah [name] (MRN [id number]) as of 08/11/2018 12:40  Ref. Range 07/29/2018 09:06  Vitamin D, 25-Hydroxy Latest Ref Range: 30.0 - 100.0 ng/mL 32.1    I, Rebekah [name], CMA, am acting as transcriptionist for Starlyn Skeans, MD I have reviewed the above documentation for accuracy and completeness, and I agree with the above. -Dennard Nip, MD

## 2018-08-18 ENCOUNTER — Ambulatory Visit (INDEPENDENT_AMBULATORY_CARE_PROVIDER_SITE_OTHER): Payer: BC Managed Care – PPO | Admitting: Family Medicine

## 2018-08-18 ENCOUNTER — Encounter (INDEPENDENT_AMBULATORY_CARE_PROVIDER_SITE_OTHER): Payer: Self-pay | Admitting: Family Medicine

## 2018-08-18 NOTE — Telephone Encounter (Signed)
Please advise next appt on 5/26

## 2018-08-24 ENCOUNTER — Encounter: Payer: Self-pay | Admitting: Physician Assistant

## 2018-09-09 ENCOUNTER — Other Ambulatory Visit: Payer: Self-pay

## 2018-09-09 ENCOUNTER — Ambulatory Visit (INDEPENDENT_AMBULATORY_CARE_PROVIDER_SITE_OTHER): Payer: BC Managed Care – PPO | Admitting: Family Medicine

## 2018-09-09 ENCOUNTER — Encounter (INDEPENDENT_AMBULATORY_CARE_PROVIDER_SITE_OTHER): Payer: Self-pay | Admitting: Family Medicine

## 2018-09-09 DIAGNOSIS — E66813 Obesity, class 3: Secondary | ICD-10-CM

## 2018-09-09 DIAGNOSIS — F418 Other specified anxiety disorders: Secondary | ICD-10-CM | POA: Diagnosis not present

## 2018-09-09 DIAGNOSIS — Z6841 Body Mass Index (BMI) 40.0 and over, adult: Secondary | ICD-10-CM

## 2018-09-09 DIAGNOSIS — E559 Vitamin D deficiency, unspecified: Secondary | ICD-10-CM

## 2018-09-10 MED ORDER — VITAMIN D (ERGOCALCIFEROL) 1.25 MG (50000 UNIT) PO CAPS: 50000.0000 [IU] | ORAL_CAPSULE | ORAL | 1 refills | Status: DC

## 2018-09-10 MED ORDER — ESCITALOPRAM OXALATE 10 MG PO TABS: 10.0000 mg | ORAL_TABLET | ORAL | 1 refills | Status: DC

## 2018-09-10 NOTE — Progress Notes (Signed)
Office: [phone]  /  Fax: [phone] TeleHealth Visit:  [name] has verbally consented to this TeleHealth visit today. The patient is located at home, the provider is located at the [facility] office. The participants in this visit include the listed provider and patient. The visit was conducted today via Face Time.  HPI:   Chief Complaint: OBESITY [name] is here to discuss her progress with her obesity treatment plan. She is on the Atkins plan and is following her eating plan. She states she is exercising 0 minutes 0 times per week. [name] has been following Atkins, as she feels this give her the best results. She is not journaling or tracking calories or carbs, however. She does not log weight and may even be gaining weight. [name] is status post weight loss surgery.  We were unable to weigh the patient today for this TeleHealth visit. She feels as if she has lost weight since her last visit. She has lost 39 lbs since starting treatment with [name].  Vitamin D Deficiency Marki has a diagnosis of vitamin D deficiency. She is currently stable on vit D. Kenyona denies nausea, vomiting, or muscle weakness.  Depression with emotional eating behaviors Rain's mood is stable on Lexapro. She still struggles with her planning ahead and feeling deprived about limiting certain foods. She is struggling with emotional eating and using food for comfort to the extent that it is negatively impacting her health. She often snacks when she is not hungry. Melondy sometimes feels she is out of control and then feels guilty that she made poor food choices. She has been working on behavior modification techniques to help reduce her emotional eating and has been somewhat successful. She shows no sign of suicidal or homicidal ideations.  ASSESSMENT AND PLAN:  Vitamin D deficiency - Plan: Vitamin D, Ergocalciferol, (DRISDOL) 1.25 MG (50000 UT) CAPS capsule  Depression with anxiety - Plan: escitalopram  (LEXAPRO) 10 MG tablet  Class 3 severe obesity with serious comorbidity and body mass index (BMI) of 40.0 to 44.9 in adult, unspecified obesity type ([place])  PLAN:  Vitamin D Deficiency [name] was informed that low vitamin D levels contribute to fatigue and are associated with obesity, breast, and colon cancer. [name] agrees to continue to take prescription Vit D @50 ,000 IU every week #4 with no refills and will follow up for routine testing of vitamin D, at least 2-3 times per year. She was informed of the risk of over-replacement of vitamin D and agrees to not increase her dose unless she discusses this with [name] first. [name] agrees to follow up in 4 weeks as directed.  Depression with Emotional Eating Behaviors We discussed behavior modification techniques today to help [name] deal with her emotional eating and depression. She has agreed to take Lexapro 10 mg qd #30 with no refills and agreed to follow up as directed.  I spent > than 50% of the 25 minute visit on counseling as documented in the note.  Obesity [name] is currently in the action stage of change. As such, her goal is to continue with weight loss efforts. She has agreed to keep a food journal with 1200 calories and 80+ grams of protein and keeping sugar below 50 grams per day. [name] has been instructed to work up to a goal of 150 minutes of combined cardio and strengthening exercise per week for weight loss and overall health benefits. We discussed the following Behavioral Modification Strategies today: increasing lean protein intake, increasing  vegetables, keep a strict food journal, and ways to avoid boredom eating.  [name] has agreed to follow up with our clinic in 4 weeks. She was informed of the importance of frequent follow up visits to maximize her success with intensive lifestyle modifications for her multiple health conditions.  ALLERGIES: Allergies  Allergen Reactions  . Beef-Derived Products Anaphylaxis    After tick bite, cannot  eat beef, pork or lamb  . Lambs Quarters Anaphylaxis    After tick bite cannot eat beef, pork or lamb  . Mucinex [Guaifenesin Er] Anaphylaxis  . Pork-Derived Products Anaphylaxis    After tick bite cannot eat beef, pork or lamb  . Darvon [Propoxyphene] Other (See Comments)    Hallucinations   . Ace Inhibitors Swelling and Cough    Pedal Edema  . Hydromet [Hydrocodone-Homatropine] Itching and Other (See Comments)    Severe stomach pain-face itches.   . Sulfonamide Derivatives Rash    MEDICATIONS: Current Outpatient Medications on File Prior to Visit  Medication Sig Dispense Refill  . acetaminophen (TYLENOL) 500 MG tablet Take 500-1,000 mg by mouth every 6 (six) hours as needed for moderate pain or headache.    . albuterol (PROVENTIL HFA;VENTOLIN HFA) 108 (90 Base) MCG/ACT inhaler Inhale 2 puffs into the lungs every 6 (six) hours as needed for wheezing or shortness of breath. 1 Inhaler 0  . amoxicillin (AMOXIL) 500 MG capsule Take 1 capsule (500 mg total) by mouth 3 (three) times daily. 30 capsule 0  . CALCIUM PO Take 500 mg by mouth daily.     Marland Kitchen CARTIA XT 120 MG 24 hr capsule TAKE 1 CAPSULE BY MOUTH ONCE DAILY (NEEDS  APPOINTMENT  FOR  REFILLS) 90 capsule 2  . cetirizine (ZYRTEC) 10 MG tablet Take 10 mg by mouth at bedtime.     Marland Kitchen desmopressin (DDAVP) 0.2 MG tablet Take 0.6 mg by mouth at bedtime.    Marland Kitchen diltiazem (CARDIZEM) 30 MG tablet TAKE ONE TO TWO TABLETS BY MOUTH EVERY 6 HOURS AS NEEDED FOR  FAST  HEART  RATES 90 tablet 0  . EPINEPHrine (EPIPEN) 0.3 mg/0.3 mL IJ SOAJ injection Inject 0.3 mLs (0.3 mg total) into the muscle once as needed (anaphylaxis). 1 Device 6  . escitalopram (LEXAPRO) 10 MG tablet Take 1 tablet (10 mg total) by mouth every morning. 30 tablet 0  . flecainide (TAMBOCOR) 100 MG tablet Take 3 tablets by mouth at onset of fast heart rate as needed. 270 tablet 0  . furosemide (LASIX) 20 MG tablet Take 1 tablet (20 mg total) by mouth daily as needed for edema. 30 tablet  3  . levothyroxine (SYNTHROID, LEVOTHROID) 50 MCG tablet TAKE 1 TABLET BY MOUTH ONCE DAILY BEFORE BREAKFAST 90 tablet 0  . losartan (COZAAR) 50 MG tablet Take 1 tablet (50 mg total) by mouth 2 (two) times daily. 180 tablet 3  . methylPREDNISolone (MEDROL DOSEPAK) 4 MG TBPK tablet Take med as directed in a taper fashion 21 tablet 0  . Multiple Vitamins-Minerals (MULTIVITAMIN ADULT PO) Take 1 capsule by mouth 2 (two) times daily. Journey 3+3 Bariatric Multivitamin    . oxybutynin (DITROPAN-XL) 10 MG 24 hr tablet Take 10 mg by mouth at bedtime.    . OXYGEN Inhale 3 L/min into the lungs at bedtime. Continuously    . rivaroxaban (XARELTO) 20 MG TABS tablet TAKE 1 TABLET BY MOUTH ONCE DAILY WITH SUPPER 90 tablet 1  . traMADol (ULTRAM) 50 MG tablet Take 1 tablet (50 mg total) by mouth  every 6 (six) hours as needed. 30 tablet 1  . Vitamin D, Ergocalciferol, (DRISDOL) 1.25 MG (50000 UT) CAPS capsule Take 1 capsule (50,000 Units total) by mouth every 7 (seven) days. 4 capsule 0   No current facility-administered medications on file prior to visit.     PAST MEDICAL HISTORY: Past Medical History:  Diagnosis Date  . Anxiety   . Arthritis   . Bilateral bunions   . Complication of anesthesia   . Difficulty sleeping    prior sleep study did not reveal sleep apnea per patient  . DJD (degenerative joint disease)   . Dyspnea   . Dysrhythmia    a-fib  . Environmental allergies   . Food allergy   . Gallbladder problem   . GERD (gastroesophageal reflux disease)   . Heart murmur   . Hypertension    no meds after weight loss  . Hypothyroidism   . Incontinence of urine    at nite   . Joint pain   . Left atrial dilatation   . Left foot pain   . Leg edema   . Obesity    s/p gastric sleeve 12/2013 (previously weighed close to 400 lbs)  . Osteoarthritis   . Palpitations   . Paroxysmal atrial fibrillation (HCC)   . Pneumonia    hx of   . PONV (postoperative nausea and vomiting)   . Status post  bilateral knee replacements   . Supplemental oxygen dependent    uses 2l/ at night, states HR goes low and O2 drops  . Tuberculosis    had 6 month of INH due to exposure   . Vaginal vault prolapse     PAST SURGICAL HISTORY: Past Surgical History:  Procedure Laterality Date  . APPENDECTOMY    . CHOLECYSTECTOMY    . EYE SURGERY     03/2014 lens implant left lens   . FOOT ARTHRODESIS Left 03/15/2016   Procedure: TALONAVICULAR AND SUBTALAR ARTHRODESIS;  Surgeon: Wylene Simmer, MD;  Location: [facility];  Service: Orthopedics;  Laterality: Left;  Marland Kitchen GASTROC RECESSION EXTREMITY Left 03/15/2016   Procedure: LEFT GASTROC RECESSION;  Surgeon: Wylene Simmer, MD;  Location: [facility];  Service: Orthopedics;  Laterality: Left;  . JOINT REPLACEMENT  1999   L TOTAL KNEE  . LAPAROSCOPIC GASTRIC SLEEVE RESECTION N/A 01/04/2014   Procedure: LAPAROSCOPIC GASTRIC SLEEVE RESECTION;  Surgeon: Greer Pickerel, MD;  Location: [facility];  Service: General;  Laterality: N/A;  . TONSILLECTOMY    . TOTAL KNEE ARTHROPLASTY Right 05/17/2015   Procedure: RIGHT TOTAL KNEE ARTHROPLASTY;  Surgeon: Paralee Cancel, MD;  Location: [facility];  Service: Orthopedics;  Laterality: Right;  . TRANSTHORACIC ECHOCARDIOGRAM  11/2012   EF 60-65%, mod LVH & mod conc hypertrophy, grade 1 diastolic dysfunction; LA mildly dilated; RV systolic pressure increased; PA peak pressure 15mmHg  . TUBAL LIGATION    . UPPER GI ENDOSCOPY  01/04/2014   Procedure: UPPER GI ENDOSCOPY;  Surgeon: Greer Pickerel, MD;  Location: [facility];  Service: General;;    SOCIAL HISTORY: Social History   Tobacco Use  . Smoking status: Never Smoker  . Smokeless tobacco: Never Used  Substance Use Topics  . Alcohol use: No  . Drug use: No    FAMILY HISTORY: Family History  Problem Relation Age of Onset  . Diabetes Father   . Hyperlipidemia Father   . Hypertension Father   . Heart disease Father   . Sudden death Father   . Anxiety  disorder  Father   . Obesity Father   . Uterine cancer Mother   . Cervical cancer Mother   . Breast cancer Mother   . Cancer Mother        cervical and brreast cancer  . Hypertension Mother   . Hyperlipidemia Mother   . Obesity Mother   . Diabetes Maternal Grandmother     ROS: Review of Systems  Gastrointestinal: Negative for nausea and vomiting.  Musculoskeletal:       Negative for muscle weakness.  Psychiatric/Behavioral: Positive for depression. Negative for suicidal ideas.       Negative for homicidal ideations.    PHYSICAL EXAM: Pt in no acute distress  RECENT LABS AND TESTS: BMET    Component Value Date/Time   NA 144 03/06/2018 1319   K 4.6 03/06/2018 1319   CL 100 03/06/2018 1319   CO2 25 03/06/2018 1319   GLUCOSE 78 03/06/2018 1319   GLUCOSE 141 (H) 05/18/2015 0456   BUN 16 03/06/2018 1319   CREATININE 0.85 03/06/2018 1319   CALCIUM 10.5 (H) 03/06/2018 1319   GFRNONAA 73 03/06/2018 1319   GFRAA 84 03/06/2018 1319   Lab Results  Component Value Date   HGBA1C 5.5 07/29/2018   HGBA1C 5.5 03/06/2018   HGBA1C 5.7 (H) 12/02/2017   HGBA1C 5.5 05/02/2017   HGBA1C 5.9 (H) 01/01/2017   Lab Results  Component Value Date   INSULIN 9.8 07/29/2018   INSULIN 6.5 03/06/2018   INSULIN 9.2 12/02/2017   INSULIN 7.9 05/02/2017   INSULIN 9.8 01/01/2017   CBC    Component Value Date/Time   WBC 7.6 01/01/2017 1335   WBC 8.0 05/18/2015 0456   RBC 4.91 01/01/2017 1335   RBC 3.29 (L) 05/18/2015 0456   HGB 13.6 01/01/2017 1335   HCT 42.6 01/01/2017 1335   PLT 117 (L) 05/18/2015 0456   MCV 87 01/01/2017 1335   MCH 27.7 01/01/2017 1335   MCH 29.2 05/18/2015 0456   MCHC 31.9 01/01/2017 1335   MCHC 32.3 05/18/2015 0456   RDW 14.7 01/01/2017 1335   LYMPHSABS 1.9 01/01/2017 1335   MONOABS 0.8 03/26/2015 0316   EOSABS 0.2 01/01/2017 1335   BASOSABS 0.1 01/01/2017 1335   Iron/TIBC/Ferritin/ %Sat No results found for: IRON, TIBC, FERRITIN, IRONPCTSAT Lipid Panel      Component Value Date/Time   CHOL 179 03/06/2018 1319   TRIG 84 03/06/2018 1319   HDL 64 03/06/2018 1319   CHOLHDL 2.8 03/06/2018 1319   CHOLHDL 4.2 07/02/2013 1301   VLDL 43 (H) 07/02/2013 1301   LDLCALC 98 03/06/2018 1319   Hepatic Function Panel     Component Value Date/Time   PROT 7.6 03/06/2018 1319   ALBUMIN 4.6 03/06/2018 1319   AST 19 03/06/2018 1319   ALT 17 03/06/2018 1319   ALKPHOS 85 03/06/2018 1319   BILITOT 0.5 03/06/2018 1319      Component Value Date/Time   TSH 3.740 07/29/2018 0906   TSH 3.580 03/06/2018 1319   TSH 3.090 05/02/2017 0850    Results for Loney, NASTACIA RAYBUCK (MRN [id number]) as of 09/10/2018 13:22  Ref. Range 07/29/2018 09:06  Vitamin D, 25-Hydroxy Latest Ref Range: 30.0 - 100.0 ng/mL 32.1    I, Marcille Blanco, CMA, am acting as transcriptionist for Starlyn Skeans, MD I have reviewed the above documentation for accuracy and completeness, and I agree with the above. -Dennard Nip, MD

## 2018-09-17 ENCOUNTER — Encounter (INDEPENDENT_AMBULATORY_CARE_PROVIDER_SITE_OTHER): Payer: Self-pay | Admitting: Family Medicine

## 2018-09-17 NOTE — Telephone Encounter (Signed)
Please advise 

## 2018-09-23 DIAGNOSIS — I2721 Secondary pulmonary arterial hypertension: Secondary | ICD-10-CM

## 2018-09-23 NOTE — Telephone Encounter (Signed)
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 I would encourage compliance with prescribed O2 at night.

## 2018-10-07 ENCOUNTER — Ambulatory Visit (INDEPENDENT_AMBULATORY_CARE_PROVIDER_SITE_OTHER): Payer: BC Managed Care – PPO | Admitting: Family Medicine

## 2018-10-07 ENCOUNTER — Encounter (INDEPENDENT_AMBULATORY_CARE_PROVIDER_SITE_OTHER): Payer: Self-pay | Admitting: Family Medicine

## 2018-10-07 ENCOUNTER — Other Ambulatory Visit: Payer: Self-pay

## 2018-10-07 DIAGNOSIS — F418 Other specified anxiety disorders: Secondary | ICD-10-CM

## 2018-10-07 DIAGNOSIS — E559 Vitamin D deficiency, unspecified: Secondary | ICD-10-CM

## 2018-10-07 DIAGNOSIS — Z6841 Body Mass Index (BMI) 40.0 and over, adult: Secondary | ICD-10-CM | POA: Diagnosis not present

## 2018-10-08 ENCOUNTER — Ambulatory Visit: Payer: Self-pay | Admitting: Physician Assistant

## 2018-10-08 NOTE — Progress Notes (Signed)
Office: [phone]  /  Fax: [phone] TeleHealth Visit:  Rebekah [name] has verbally consented to this TeleHealth visit today. The patient is located at home, the provider is located at the [facility] office. The participants in this visit include the listed provider and patient and any and all parties involved. The visit was conducted today via [employer].  HPI:   Chief Complaint: OBESITY Rebekah [name] is here to discuss her progress with her obesity treatment plan. She is on the keep a food journal with 1200 calories and 80 grams of protein daily plan and is following her eating plan approximately 100 % of the time. She states she is walking 30 to 60 minutes 5 to 6 times per week. Teah has done well maintaining her weight. She is journaling most days and she is doing well meeting her sugar and protein goals. She has increased eating out while traveling, but she is still being mindful of her choices. We were unable to weigh the patient today for this TeleHealth visit. She feels as if she has maintained weight since her last visit. She has lost 39 lbs since starting treatment with [name].  Vitamin D deficiency Rebekah [name] has a diagnosis of vitamin D deficiency. Rebekah [name] is stable on vit D, but she is not yet at goal. She denies nausea, vomiting or muscle weakness.  Depression with Anxiety Rebekah [name]'s mood is stable on Lexapro. She has no insomnia and her fatigue is improving. Rebekah [name] feels more in control of her emotional eating lately. She shows no sign of suicidal or homicidal ideations.  ASSESSMENT AND PLAN:  Vitamin D deficiency - Plan: Vitamin D, Ergocalciferol, (DRISDOL) 1.25 MG (50000 UT) CAPS capsule  Depression with anxiety - Plan: escitalopram (LEXAPRO) 10 MG tablet  Class 3 severe obesity with serious comorbidity and body mass index (BMI) of 40.0 to 44.9 in adult, unspecified obesity type (Rebekah [name])  PLAN:  Vitamin D Deficiency Rebekah [name] was informed that low vitamin D levels contributes to fatigue  and are associated with obesity, breast, and colon cancer. She agrees to continue to take prescription Vit D @50 ,000 IU every week #4 with no refills and will follow up for routine testing of vitamin D, at least 2-3 times per year. She was informed of the risk of over-replacement of vitamin D and agrees to not increase her dose unless she discusses this with [name] first. Rebekah [name] agrees to follow up as directed.  Depression with Anxiety We discussed behavior modification techniques today to help Rebekah [name] deal with her anxiety and depression. She has agreed to continue Lexapro 10 mg daily #30 with no refills and follow up as directed.  Obesity Rebekah [name] is currently in the action stage of change. As such, her goal is to continue with weight loss efforts She has agreed to keep a food journal with 1200 calories and 80 grams of protein daily and keep sugar to below 50 grams daily Rebekah [name] has been instructed to work up to a goal of 150 minutes of combined cardio and strengthening exercise per week for weight loss and overall health benefits. We discussed the following Behavioral Modification Strategies today: increasing lean protein intake, decreasing simple carbohydrates and work on meal planning and easy cooking plans  Rebekah [name] has agreed to follow up with our clinic in 2 weeks. She was informed of the importance of frequent follow up visits to maximize her success with intensive lifestyle modifications for her multiple health conditions.  ALLERGIES: Allergies  Allergen Reactions  . Beef-Derived Products Anaphylaxis  After tick bite, cannot eat beef, pork or lamb  . Lambs Quarters Anaphylaxis    After tick bite cannot eat beef, pork or lamb  . Mucinex [Guaifenesin Er] Anaphylaxis  . Pork-Derived Products Anaphylaxis    After tick bite cannot eat beef, pork or lamb  . Darvon [Propoxyphene] Other (See Comments)    Hallucinations   . Ace Inhibitors Swelling and Cough    Pedal Edema  . Hydromet  [Hydrocodone-Homatropine] Itching and Other (See Comments)    Severe stomach pain-face itches.   . Sulfonamide Derivatives Rash    MEDICATIONS: Current Outpatient Medications on File Prior to Visit  Medication Sig Dispense Refill  . acetaminophen (TYLENOL) 500 MG tablet Take 500-1,000 mg by mouth every 6 (six) hours as needed for moderate pain or headache.    . albuterol (PROVENTIL HFA;VENTOLIN HFA) 108 (90 Base) MCG/ACT inhaler Inhale 2 puffs into the lungs every 6 (six) hours as needed for wheezing or shortness of breath. 1 Inhaler 0  . amoxicillin (AMOXIL) 500 MG capsule Take 1 capsule (500 mg total) by mouth 3 (three) times daily. 30 capsule 0  . CALCIUM PO Take 500 mg by mouth daily.     Marland Kitchen CARTIA XT 120 MG 24 hr capsule TAKE 1 CAPSULE BY MOUTH ONCE DAILY (NEEDS  APPOINTMENT  FOR  REFILLS) 90 capsule 2  . cetirizine (ZYRTEC) 10 MG tablet Take 10 mg by mouth at bedtime.     Marland Kitchen desmopressin (DDAVP) 0.2 MG tablet Take 0.6 mg by mouth at bedtime.    Marland Kitchen diltiazem (CARDIZEM) 30 MG tablet TAKE ONE TO TWO TABLETS BY MOUTH EVERY 6 HOURS AS NEEDED FOR  FAST  HEART  RATES 90 tablet 0  . EPINEPHrine (EPIPEN) 0.3 mg/0.3 mL IJ SOAJ injection Inject 0.3 mLs (0.3 mg total) into the muscle once as needed (anaphylaxis). 1 Device 6  . escitalopram (LEXAPRO) 10 MG tablet Take 1 tablet (10 mg total) by mouth every morning. 30 tablet 1  . flecainide (TAMBOCOR) 100 MG tablet Take 3 tablets by mouth at onset of fast heart rate as needed. 270 tablet 0  . furosemide (LASIX) 20 MG tablet Take 1 tablet (20 mg total) by mouth daily as needed for edema. 30 tablet 3  . levothyroxine (SYNTHROID, LEVOTHROID) 50 MCG tablet TAKE 1 TABLET BY MOUTH ONCE DAILY BEFORE BREAKFAST 90 tablet 0  . losartan (COZAAR) 50 MG tablet Take 1 tablet (50 mg total) by mouth 2 (two) times daily. 180 tablet 3  . methylPREDNISolone (MEDROL DOSEPAK) 4 MG TBPK tablet Take med as directed in a taper fashion 21 tablet 0  . Multiple Vitamins-Minerals  (MULTIVITAMIN ADULT PO) Take 1 capsule by mouth 2 (two) times daily. Journey 3+3 Bariatric Multivitamin    . oxybutynin (DITROPAN-XL) 10 MG 24 hr tablet Take 10 mg by mouth at bedtime.    . OXYGEN Inhale 3 L/min into the lungs at bedtime. Continuously    . rivaroxaban (XARELTO) 20 MG TABS tablet TAKE 1 TABLET BY MOUTH ONCE DAILY WITH SUPPER 90 tablet 1  . traMADol (ULTRAM) 50 MG tablet Take 1 tablet (50 mg total) by mouth every 6 (six) hours as needed. 30 tablet 1  . Vitamin D, Ergocalciferol, (DRISDOL) 1.25 MG (50000 UT) CAPS capsule Take 1 capsule (50,000 Units total) by mouth every 7 (seven) days. 4 capsule 1   No current facility-administered medications on file prior to visit.     PAST MEDICAL HISTORY: Past Medical History:  Diagnosis Date  . Anxiety   .  Arthritis   . Bilateral bunions   . Complication of anesthesia   . Difficulty sleeping    prior sleep study did not reveal sleep apnea per patient  . DJD (degenerative joint disease)   . Dyspnea   . Dysrhythmia    a-fib  . Environmental allergies   . Food allergy   . Gallbladder problem   . GERD (gastroesophageal reflux disease)   . Heart murmur   . Hypertension    no meds after weight loss  . Hypothyroidism   . Incontinence of urine    at nite   . Joint pain   . Left atrial dilatation   . Left foot pain   . Leg edema   . Obesity    s/p gastric sleeve 12/2013 (previously weighed close to 400 lbs)  . Osteoarthritis   . Palpitations   . Paroxysmal atrial fibrillation (HCC)   . Pneumonia    hx of   . PONV (postoperative nausea and vomiting)   . Status post bilateral knee replacements   . Supplemental oxygen dependent    uses 2l/[place] at night, states HR goes low and O2 drops  . Tuberculosis    had 6 month of INH due to exposure   . Vaginal vault prolapse     PAST SURGICAL HISTORY: Past Surgical History:  Procedure Laterality Date  . APPENDECTOMY    . CHOLECYSTECTOMY    . EYE SURGERY     03/2014 lens implant  left lens   . FOOT ARTHRODESIS Left 03/15/2016   Procedure: TALONAVICULAR AND SUBTALAR ARTHRODESIS;  Surgeon: Wylene Simmer, MD;  Location: [place];  Service: Orthopedics;  Laterality: Left;  Marland Kitchen GASTROC RECESSION EXTREMITY Left 03/15/2016   Procedure: LEFT GASTROC RECESSION;  Surgeon: Wylene Simmer, MD;  Location: [place];  Service: Orthopedics;  Laterality: Left;  . JOINT REPLACEMENT  1999   L TOTAL KNEE  . LAPAROSCOPIC GASTRIC SLEEVE RESECTION N/A 01/04/2014   Procedure: LAPAROSCOPIC GASTRIC SLEEVE RESECTION;  Surgeon: Greer Pickerel, MD;  Location: [facility];  Service: General;  Laterality: N/A;  . TONSILLECTOMY    . TOTAL KNEE ARTHROPLASTY Right 05/17/2015   Procedure: RIGHT TOTAL KNEE ARTHROPLASTY;  Surgeon: Paralee Cancel, MD;  Location: [facility];  Service: Orthopedics;  Laterality: Right;  . TRANSTHORACIC ECHOCARDIOGRAM  11/2012   EF 60-65%, mod LVH & mod conc hypertrophy, grade 1 diastolic dysfunction; LA mildly dilated; RV systolic pressure increased; PA peak pressure 63mmHg  . TUBAL LIGATION    . UPPER GI ENDOSCOPY  01/04/2014   Procedure: UPPER GI ENDOSCOPY;  Surgeon: Greer Pickerel, MD;  Location: [facility];  Service: General;;    SOCIAL HISTORY: Social History   Tobacco Use  . Smoking status: Never Smoker  . Smokeless tobacco: Never Used  Substance Use Topics  . Alcohol use: No  . Drug use: No    FAMILY HISTORY: Family History  Problem Relation Age of Onset  . Diabetes Father   . Hyperlipidemia Father   . Hypertension Father   . Heart disease Father   . Sudden death Father   . Anxiety disorder Father   . Obesity Father   . Uterine cancer Mother   . Cervical cancer Mother   . Breast cancer Mother   . Cancer Mother        cervical and brreast cancer  . Hypertension Mother   . Hyperlipidemia Mother   . Obesity Mother   . Diabetes Maternal Grandmother     ROS:  Review of Systems  Constitutional: Negative for weight loss.  Gastrointestinal:  Negative for nausea and vomiting.  Musculoskeletal:       Negative for muscle weakness  Psychiatric/Behavioral: Positive for depression. Negative for suicidal ideas. The patient is nervous/anxious.     PHYSICAL EXAM: Pt in no acute distress  RECENT LABS AND TESTS: BMET    Component Value Date/Time   NA 144 03/06/2018 1319   K 4.6 03/06/2018 1319   CL 100 03/06/2018 1319   CO2 25 03/06/2018 1319   GLUCOSE 78 03/06/2018 1319   GLUCOSE 141 (H) 05/18/2015 0456   BUN 16 03/06/2018 1319   CREATININE 0.85 03/06/2018 1319   CALCIUM 10.5 (H) 03/06/2018 1319   GFRNONAA 73 03/06/2018 1319   GFRAA 84 03/06/2018 1319   Lab Results  Component Value Date   HGBA1C 5.5 07/29/2018   HGBA1C 5.5 03/06/2018   HGBA1C 5.7 (H) 12/02/2017   HGBA1C 5.5 05/02/2017   HGBA1C 5.9 (H) 01/01/2017   Lab Results  Component Value Date   INSULIN 9.8 07/29/2018   INSULIN 6.5 03/06/2018   INSULIN 9.2 12/02/2017   INSULIN 7.9 05/02/2017   INSULIN 9.8 01/01/2017   CBC    Component Value Date/Time   WBC 7.6 01/01/2017 1335   WBC 8.0 05/18/2015 0456   RBC 4.91 01/01/2017 1335   RBC 3.29 (L) 05/18/2015 0456   HGB 13.6 01/01/2017 1335   HCT 42.6 01/01/2017 1335   PLT 117 (L) 05/18/2015 0456   MCV 87 01/01/2017 1335   MCH 27.7 01/01/2017 1335   MCH 29.2 05/18/2015 0456   MCHC 31.9 01/01/2017 1335   MCHC 32.3 05/18/2015 0456   RDW 14.7 01/01/2017 1335   LYMPHSABS 1.9 01/01/2017 1335   MONOABS 0.8 03/26/2015 0316   EOSABS 0.2 01/01/2017 1335   BASOSABS 0.1 01/01/2017 1335   Iron/TIBC/Ferritin/ %Sat No results found for: IRON, TIBC, FERRITIN, IRONPCTSAT Lipid Panel     Component Value Date/Time   CHOL 179 03/06/2018 1319   TRIG 84 03/06/2018 1319   HDL 64 03/06/2018 1319   CHOLHDL 2.8 03/06/2018 1319   CHOLHDL 4.2 07/02/2013 1301   VLDL 43 (H) 07/02/2013 1301   LDLCALC 98 03/06/2018 1319   Hepatic Function Panel     Component Value Date/Time   PROT 7.6 03/06/2018 1319   ALBUMIN 4.6  03/06/2018 1319   AST 19 03/06/2018 1319   ALT 17 03/06/2018 1319   ALKPHOS 85 03/06/2018 1319   BILITOT 0.5 03/06/2018 1319      Component Value Date/Time   TSH 3.740 07/29/2018 0906   TSH 3.580 03/06/2018 1319   TSH 3.090 05/02/2017 0850     Ref. Range 07/29/2018 09:06  Vitamin D, 25-Hydroxy Latest Ref Range: 30.0 - 100.0 ng/mL 32.1    I, Doreene Nest, am acting as Location manager for Dennard Nip, MD I have reviewed the above documentation for accuracy and completeness, and I agree with the above. -Dennard Nip, MD

## 2018-10-10 ENCOUNTER — Ambulatory Visit: Payer: Self-pay | Admitting: Physician Assistant

## 2018-10-13 MED ORDER — ESCITALOPRAM OXALATE 10 MG PO TABS: 10.0000 mg | ORAL_TABLET | ORAL | 1 refills | Status: DC

## 2018-10-13 MED ORDER — VITAMIN D (ERGOCALCIFEROL) 1.25 MG (50000 UNIT) PO CAPS: 50000.0000 [IU] | ORAL_CAPSULE | ORAL | 1 refills | Status: DC

## 2018-10-21 ENCOUNTER — Other Ambulatory Visit: Payer: Self-pay

## 2018-10-21 ENCOUNTER — Ambulatory Visit (HOSPITAL_COMMUNITY): Payer: BC Managed Care – PPO | Attending: Cardiology

## 2018-10-21 DIAGNOSIS — I2721 Secondary pulmonary arterial hypertension: Secondary | ICD-10-CM | POA: Diagnosis not present

## 2018-10-22 ENCOUNTER — Encounter: Payer: Self-pay | Admitting: Physician Assistant

## 2018-10-24 ENCOUNTER — Ambulatory Visit (INDEPENDENT_AMBULATORY_CARE_PROVIDER_SITE_OTHER): Payer: BC Managed Care – PPO | Admitting: Physician Assistant

## 2018-10-24 ENCOUNTER — Other Ambulatory Visit: Payer: Self-pay

## 2018-10-24 ENCOUNTER — Encounter: Payer: Self-pay | Admitting: Physician Assistant

## 2018-10-24 DIAGNOSIS — E119 Type 2 diabetes mellitus without complications: Secondary | ICD-10-CM | POA: Diagnosis not present

## 2018-10-24 DIAGNOSIS — I480 Paroxysmal atrial fibrillation: Secondary | ICD-10-CM

## 2018-10-24 DIAGNOSIS — N814 Uterovaginal prolapse, unspecified: Secondary | ICD-10-CM | POA: Diagnosis not present

## 2018-10-24 MED ORDER — TRAMADOL HCL 50 MG PO TABS: 50.0000 mg | ORAL_TABLET | Freq: Four times a day (QID) | ORAL | 2 refills | Status: DC | PRN

## 2018-10-24 MED ORDER — DESMOPRESSIN ACETATE 0.2 MG PO TABS: 0.6000 mg | ORAL_TABLET | Freq: Every day | ORAL | 11 refills | Status: DC

## 2018-10-24 MED ORDER — LEVOTHYROXINE SODIUM 50 MCG PO TABS: ORAL_TABLET | ORAL | 3 refills | Status: DC

## 2018-10-24 NOTE — Progress Notes (Signed)
Telephone visit  Subjective: CC recheck on chronic conditions PCP: Terald Sleeper, PA-C YQI:HKVQ E Rebekah [name] is a 63 y.o. female calls for telephone consult today. Patient provides verbal consent for consult held via phone.  Patient is identified with 2 separate identifiers.  At this time the entire area is on COVID-19 social distancing and stay home orders are in place.  Patient is of higher risk and therefore we are performing this by a virtual method.  Location of patient: Home Location of provider: [facility] Others present for call: No    This patient is having a phone visit because her husband is possibly exposed to someone with [facility].  The person did test positive and she and her husband both are under COVID testing.  It was performed last week.  She states she has not had any symptoms at this time.  Her husband has not either.  She reports that her blood pressures been 127/66 pulse of 62 and oxygenation at 94%.  She is still following with her pulmonologist and cardiologist.  They will continue with her medications.  She does still continue with a primary complaint of uterine prolapse.  It is causing her to have some urinary symptoms.  She would like to have a referral to gynecology as soon as possible.  We will try to arrange this for her.  All other than the medications are reviewed and refilled as needed.  She is being followed by Dr. Redgie Grayer with healthy weight and wellness and all of those labs are reviewed and she is up-to-date on her labs.   ROS: Per HPI  Allergies  Allergen Reactions  . Beef-Derived Products Anaphylaxis    After tick bite, cannot eat beef, pork or lamb  . Lambs Quarters Anaphylaxis    After tick bite cannot eat beef, pork or lamb  . Mucinex [Guaifenesin Er] Anaphylaxis  . Pork-Derived Products Anaphylaxis    After tick bite cannot eat beef, pork or lamb  . Darvon [Propoxyphene] Other (See Comments)    Hallucinations   . Ace Inhibitors  Swelling and Cough    Pedal Edema  . Ppd [Tuberculin Purified Protein Derivative]     Always has positive testing to PPD, do not use  . Hydromet [Hydrocodone-Homatropine] Itching and Other (See Comments)    Severe stomach pain-face itches.   . Sulfonamide Derivatives Rash   Past Medical History:  Diagnosis Date  . Anxiety   . Arthritis   . Bilateral bunions   . Complication of anesthesia   . Difficulty sleeping    prior sleep study did not reveal sleep apnea per patient  . DJD (degenerative joint disease)   . Dyspnea   . Dysrhythmia    a-fib  . Environmental allergies   . Food allergy   . Gallbladder problem   . GERD (gastroesophageal reflux disease)   . Heart murmur   . Hypertension    no meds after weight loss  . Hypothyroidism   . Incontinence of urine    at nite   . Joint pain   . Left atrial dilatation   . Left foot pain   . Leg edema   . Obesity    s/p gastric sleeve 12/2013 (previously weighed close to 400 lbs)  . Osteoarthritis   . Palpitations   . Paroxysmal atrial fibrillation (HCC)   . Pneumonia    hx of   . PONV (postoperative nausea and vomiting)   . Status  post bilateral knee replacements   . Supplemental oxygen dependent    uses 2l/[place] at night, states HR goes low and O2 drops  . Tuberculosis    had 6 month of INH due to exposure   . Vaginal vault prolapse     Current Outpatient Medications:  .  acetaminophen (TYLENOL) 500 MG tablet, Take 500-1,000 mg by mouth every 6 (six) hours as needed for moderate pain or headache., Disp: , Rfl:  .  albuterol (PROVENTIL HFA;VENTOLIN HFA) 108 (90 Base) MCG/ACT inhaler, Inhale 2 puffs into the lungs every 6 (six) hours as needed for wheezing or shortness of breath., Disp: 1 Inhaler, Rfl: 0 .  amoxicillin (AMOXIL) 500 MG capsule, Take 1 capsule (500 mg total) by mouth 3 (three) times daily., Disp: 30 capsule, Rfl: 0 .  CALCIUM PO, Take 500 mg by mouth daily. , Disp: , Rfl:  .  CARTIA XT 120 MG 24 hr capsule, TAKE  1 CAPSULE BY MOUTH ONCE DAILY (NEEDS  APPOINTMENT  FOR  REFILLS), Disp: 90 capsule, Rfl: 2 .  cetirizine (ZYRTEC) 10 MG tablet, Take 10 mg by mouth at bedtime. , Disp: , Rfl:  .  desmopressin (DDAVP) 0.2 MG tablet, Take 3 tablets (0.6 mg total) by mouth at bedtime., Disp: 30 tablet, Rfl: 11 .  diltiazem (CARDIZEM) 30 MG tablet, TAKE ONE TO TWO TABLETS BY MOUTH EVERY 6 HOURS AS NEEDED FOR  FAST  HEART  RATES, Disp: 90 tablet, Rfl: 0 .  EPINEPHrine (EPIPEN) 0.3 mg/0.3 mL IJ SOAJ injection, Inject 0.3 mLs (0.3 mg total) into the muscle once as needed (anaphylaxis)., Disp: 1 Device, Rfl: 6 .  escitalopram (LEXAPRO) 10 MG tablet, Take 1 tablet (10 mg total) by mouth every morning., Disp: 30 tablet, Rfl: 1 .  flecainide (TAMBOCOR) 100 MG tablet, Take 3 tablets by mouth at onset of fast heart rate as needed., Disp: 270 tablet, Rfl: 0 .  furosemide (LASIX) 20 MG tablet, Take 1 tablet (20 mg total) by mouth daily as needed for edema., Disp: 30 tablet, Rfl: 3 .  levothyroxine (SYNTHROID) 50 MCG tablet, TAKE 1 TABLET BY MOUTH ONCE DAILY BEFORE BREAKFAST, Disp: 90 tablet, Rfl: 3 .  losartan (COZAAR) 50 MG tablet, Take 1 tablet (50 mg total) by mouth 2 (two) times daily., Disp: 180 tablet, Rfl: 3 .  methylPREDNISolone (MEDROL DOSEPAK) 4 MG TBPK tablet, Take med as directed in a taper fashion, Disp: 21 tablet, Rfl: 0 .  Multiple Vitamins-Minerals (MULTIVITAMIN ADULT PO), Take 1 capsule by mouth 2 (two) times daily. Journey 3+3 Bariatric Multivitamin, Disp: , Rfl:  .  oxybutynin (DITROPAN-XL) 10 MG 24 hr tablet, Take 10 mg by mouth at bedtime., Disp: , Rfl:  .  OXYGEN, Inhale 3 L/min into the lungs at bedtime. Continuously, Disp: , Rfl:  .  rivaroxaban (XARELTO) 20 MG TABS tablet, TAKE 1 TABLET BY MOUTH ONCE DAILY WITH SUPPER, Disp: 90 tablet, Rfl: 1 .  traMADol (ULTRAM) 50 MG tablet, Take 1 tablet (50 mg total) by mouth every 6 (six) hours as needed., Disp: 60 tablet, Rfl: 2 .  Vitamin D, Ergocalciferol, (DRISDOL)  1.25 MG (50000 UT) CAPS capsule, Take 1 capsule (50,000 Units total) by mouth every 7 (seven) days., Disp: 4 capsule, Rfl: 1  Assessment/ Plan: 63 y.o. female   1. Type 2 diabetes mellitus without complication, without long-term current use of insulin (HCC) Continue medications, diet and exercise  2. Paroxysmal A-fib [facility]) Continue with cardiology  3. Morbid obesity due to excess  calories ([place]) Continue with healthy weight and wellness specialist  4. Cystocele with uterine prolapse Gynecology referra   Return in about 6 months (around 04/26/2019).  Continue all other maintenance medications as listed above.  Start time: 8:15 AM End time: 8:35 AM  Meds ordered this encounter  Medications  . levothyroxine (SYNTHROID) 50 MCG tablet    Sig: TAKE 1 TABLET BY MOUTH ONCE DAILY BEFORE BREAKFAST    Dispense:  90 tablet    Refill:  3    Order Specific Question:   Supervising Provider    Answer:   Janora Norlander [[id number]]  . desmopressin (DDAVP) 0.2 MG tablet    Sig: Take 3 tablets (0.6 mg total) by mouth at bedtime.    Dispense:  30 tablet    Refill:  11    Order Specific Question:   Supervising Provider    Answer:   Janora Norlander [[id number]]  . traMADol (ULTRAM) 50 MG tablet    Sig: Take 1 tablet (50 mg total) by mouth every 6 (six) hours as needed.    Dispense:  60 tablet    Refill:  2    Order Specific Question:   Supervising Provider    Answer:   Janora Norlander [[id number]]    Particia Nearing PA-C [place] [phone]

## 2018-10-27 DIAGNOSIS — N814 Uterovaginal prolapse, unspecified: Secondary | ICD-10-CM | POA: Insufficient documentation

## 2018-10-27 DIAGNOSIS — E119 Type 2 diabetes mellitus without complications: Secondary | ICD-10-CM | POA: Insufficient documentation

## 2018-11-05 ENCOUNTER — Encounter (INDEPENDENT_AMBULATORY_CARE_PROVIDER_SITE_OTHER): Payer: Self-pay | Admitting: Family Medicine

## 2018-11-05 ENCOUNTER — Telehealth (INDEPENDENT_AMBULATORY_CARE_PROVIDER_SITE_OTHER): Payer: BC Managed Care – PPO | Admitting: Family Medicine

## 2018-11-05 ENCOUNTER — Other Ambulatory Visit: Payer: Self-pay

## 2018-11-05 DIAGNOSIS — E66813 Obesity, class 3: Secondary | ICD-10-CM

## 2018-11-05 DIAGNOSIS — E559 Vitamin D deficiency, unspecified: Secondary | ICD-10-CM | POA: Diagnosis not present

## 2018-11-05 DIAGNOSIS — Z6841 Body Mass Index (BMI) 40.0 and over, adult: Secondary | ICD-10-CM

## 2018-11-05 DIAGNOSIS — F418 Other specified anxiety disorders: Secondary | ICD-10-CM

## 2018-11-05 MED ORDER — ESCITALOPRAM OXALATE 10 MG PO TABS: 10.0000 mg | ORAL_TABLET | ORAL | 1 refills | Status: DC

## 2018-11-05 MED ORDER — VITAMIN D (ERGOCALCIFEROL) 1.25 MG (50000 UNIT) PO CAPS: 50000.0000 [IU] | ORAL_CAPSULE | ORAL | 1 refills | Status: DC

## 2018-11-06 NOTE — Progress Notes (Signed)
Office: [phone]  /  Fax: [phone] TeleHealth Visit:  Rebekah [name] has verbally consented to this TeleHealth visit today. The patient is located at home, the provider is located at the [facility] office. The participants in this visit include the listed provider and patient. The visit was conducted today via [employer].  HPI:   Chief Complaint: OBESITY Rebekah [name] is here to discuss her progress with her obesity treatment plan. She is on the  portion control better and make smarter food choices, such as increase vegetables and decrease simple carbohydrates. She states she is doing yard work.  Rebekah [name] states her weight at home is 233. She is both journaling and following Atkins. She has been resistant to follow my advice in the past and insists she gains weight at 1200 kcal a day, even though her RMR was measured at 1670 kcal.  We were unable to weigh the patient today for this TeleHealth visit. She states her weight was 233 lbs yesterday morning. She has lost 39 lbs since starting treatment with [name].  Depression with emotional eating behaviors Rebekah [name] is struggling with emotional eating and using food for comfort to the extent that it is negatively impacting her health. She often snacks when she is not hungry. Rebekah [name] sometimes feels she is out of control and then feels guilty that she made poor food choices. She has been working on behavior modification techniques to help reduce her emotional eating and has been somewhat successful. Rebekah [name]'s mood appears improved; she is sleeping well and feels hopeful about the future. She is coping with COVID-19 isolation well and feels the Lexapro has helped. She shows no sign of suicidal or homicidal ideations.  Depression screen [facility] 2/9 07/17/2017 01/01/2017 04/20/2016 04/05/2016 12/27/2015  Decreased Interest 0 2 0 0 0  Down, Depressed, Hopeless 0 1 0 0 0  PHQ - 2 Score 0 3 0 0 0  Altered sleeping - 0 - - -  Tired, decreased energy - 2 - - -  Change in  appetite - 1 - - -  Feeling bad or failure about yourself  - 3 - - -  Trouble concentrating - 0 - - -  Moving slowly or fidgety/restless - 3 - - -  Suicidal thoughts - 0 - - -  PHQ-9 Score - 12 - - -  Difficult doing work/chores - Very difficult - - -   Vitamin D deficiency Rebekah [name] has a diagnosis of Vitamin D deficiency, which is not yet at goal. She is currently taking prescription Vit D and denies nausea, vomiting or muscle weakness.  ASSESSMENT AND PLAN:  Vitamin D deficiency - Plan: Vitamin D, Ergocalciferol, (DRISDOL) 1.25 MG (50000 UT) CAPS capsule  Depression with anxiety - Plan: escitalopram (LEXAPRO) 10 MG tablet  Class 3 severe obesity with serious comorbidity and body mass index (BMI) of 40.0 to 44.9 in adult, unspecified obesity type ([place])  PLAN:  Depression with Emotional Eating Behaviors We discussed behavior modification techniques today to help [name] deal with her emotional eating and depression. Rebekah [name] was given a refill on her Lexapro 10 mg #30 with 1 refill and agrees to follow-up with our clinic in 4 weeks.  Vitamin D Deficiency Rebekah [name] was informed that low Vitamin D levels contributes to fatigue and are associated with obesity, breast, and colon cancer. She agrees to continue to take prescription Vit D @ 50,000 IU every week #4 with 1 refill and will follow-up for routine testing of Vitamin D next month. She  was informed of the risk of over-replacement of Vitamin D and agrees to not increase her dose unless she discusses this with [name] first. Rebekah [name] agrees to follow-up with our clinic in 4 weeks.  Obesity Rebekah [name] is currently in the action stage of change. As such, her goal is to continue with weight loss efforts. She has agreed to keep a food journal with 1200 calories and 80+ grams of protein daily. Rebekah [name] has been instructed to work up to a goal of 150 minutes of combined cardio and strengthening exercise per week for weight loss and overall health benefits. We discussed the  following Behavioral Modification Strategies today: ways to avoid night time snacking and keep a strict food journal.  Rebekah [name] has agreed to follow-up with our clinic in 4 weeks. She was informed of the importance of frequent follow-up visits to maximize her success with intensive lifestyle modifications for her multiple health conditions.  ALLERGIES: Allergies  Allergen Reactions  . Beef-Derived Products Anaphylaxis    After tick bite, cannot eat beef, pork or lamb  . Lambs Quarters Anaphylaxis    After tick bite cannot eat beef, pork or lamb  . Mucinex [Guaifenesin Er] Anaphylaxis  . Pork-Derived Products Anaphylaxis    After tick bite cannot eat beef, pork or lamb  . Darvon [Propoxyphene] Other (See Comments)    Hallucinations   . Ace Inhibitors Swelling and Cough    Pedal Edema  . Ppd [Tuberculin Purified Protein Derivative]     Always has positive testing to PPD, do not use  . Hydromet [Hydrocodone-Homatropine] Itching and Other (See Comments)    Severe stomach pain-face itches.   . Sulfonamide Derivatives Rash    MEDICATIONS: Current Outpatient Medications on File Prior to Visit  Medication Sig Dispense Refill  . acetaminophen (TYLENOL) 500 MG tablet Take 500-1,000 mg by mouth every 6 (six) hours as needed for moderate pain or headache.    . albuterol (PROVENTIL HFA;VENTOLIN HFA) 108 (90 Base) MCG/ACT inhaler Inhale 2 puffs into the lungs every 6 (six) hours as needed for wheezing or shortness of breath. 1 Inhaler 0  . CALCIUM PO Take 500 mg by mouth daily.     Marland Kitchen CARTIA XT 120 MG 24 hr capsule TAKE 1 CAPSULE BY MOUTH ONCE DAILY (NEEDS  APPOINTMENT  FOR  REFILLS) 90 capsule 2  . cetirizine (ZYRTEC) 10 MG tablet Take 10 mg by mouth at bedtime.     Marland Kitchen desmopressin (DDAVP) 0.2 MG tablet Take 3 tablets (0.6 mg total) by mouth at bedtime. 30 tablet 11  . diltiazem (CARDIZEM) 30 MG tablet TAKE ONE TO TWO TABLETS BY MOUTH EVERY 6 HOURS AS NEEDED FOR  FAST  HEART  RATES 90 tablet 0  .  EPINEPHrine (EPIPEN) 0.3 mg/0.3 mL IJ SOAJ injection Inject 0.3 mLs (0.3 mg total) into the muscle once as needed (anaphylaxis). 1 Device 6  . flecainide (TAMBOCOR) 100 MG tablet Take 3 tablets by mouth at onset of fast heart rate as needed. 270 tablet 0  . furosemide (LASIX) 20 MG tablet Take 1 tablet (20 mg total) by mouth daily as needed for edema. 30 tablet 3  . levothyroxine (SYNTHROID) 50 MCG tablet TAKE 1 TABLET BY MOUTH ONCE DAILY BEFORE BREAKFAST 90 tablet 3  . losartan (COZAAR) 50 MG tablet Take 1 tablet (50 mg total) by mouth 2 (two) times daily. 180 tablet 3  . Multiple Vitamins-Minerals (MULTIVITAMIN ADULT PO) Take 1 capsule by mouth 2 (two) times daily. Journey 3+3 Bariatric Multivitamin    .  oxybutynin (DITROPAN-XL) 10 MG 24 hr tablet Take 10 mg by mouth at bedtime.    . OXYGEN Inhale 3 L/min into the lungs at bedtime. Continuously    . rivaroxaban (XARELTO) 20 MG TABS tablet TAKE 1 TABLET BY MOUTH ONCE DAILY WITH SUPPER 90 tablet 1  . traMADol (ULTRAM) 50 MG tablet Take 1 tablet (50 mg total) by mouth every 6 (six) hours as needed. 60 tablet 2   No current facility-administered medications on file prior to visit.     PAST MEDICAL HISTORY: Past Medical History:  Diagnosis Date  . Anxiety   . Arthritis   . Bilateral bunions   . Complication of anesthesia   . Difficulty sleeping    prior sleep study did not reveal sleep apnea per patient  . DJD (degenerative joint disease)   . Dyspnea   . Dysrhythmia    a-fib  . Environmental allergies   . Food allergy   . Gallbladder problem   . GERD (gastroesophageal reflux disease)   . Heart murmur   . Hypertension    no meds after weight loss  . Hypothyroidism   . Incontinence of urine    at nite   . Joint pain   . Left atrial dilatation   . Left foot pain   . Leg edema   . Obesity    s/p gastric sleeve 12/2013 (previously weighed close to 400 lbs)  . Osteoarthritis   . Palpitations   . Paroxysmal atrial fibrillation (HCC)    . Pneumonia    hx of   . PONV (postoperative nausea and vomiting)   . Status post bilateral knee replacements   . Supplemental oxygen dependent    uses 2l/[place] at night, states HR goes low and O2 drops  . Tuberculosis    had 6 month of INH due to exposure   . Vaginal vault prolapse     PAST SURGICAL HISTORY: Past Surgical History:  Procedure Laterality Date  . APPENDECTOMY    . CHOLECYSTECTOMY    . EYE SURGERY     03/2014 lens implant left lens   . FOOT ARTHRODESIS Left 03/15/2016   Procedure: TALONAVICULAR AND SUBTALAR ARTHRODESIS;  Surgeon: Wylene Simmer, MD;  Location: [place];  Service: Orthopedics;  Laterality: Left;  Marland Kitchen GASTROC RECESSION EXTREMITY Left 03/15/2016   Procedure: LEFT GASTROC RECESSION;  Surgeon: Wylene Simmer, MD;  Location: [place];  Service: Orthopedics;  Laterality: Left;  . JOINT REPLACEMENT  1999   L TOTAL KNEE  . LAPAROSCOPIC GASTRIC SLEEVE RESECTION N/A 01/04/2014   Procedure: LAPAROSCOPIC GASTRIC SLEEVE RESECTION;  Surgeon: Greer Pickerel, MD;  Location: [facility];  Service: General;  Laterality: N/A;  . TONSILLECTOMY    . TOTAL KNEE ARTHROPLASTY Right 05/17/2015   Procedure: RIGHT TOTAL KNEE ARTHROPLASTY;  Surgeon: Paralee Cancel, MD;  Location: [facility];  Service: Orthopedics;  Laterality: Right;  . TRANSTHORACIC ECHOCARDIOGRAM  11/2012   EF 60-65%, mod LVH & mod conc hypertrophy, grade 1 diastolic dysfunction; LA mildly dilated; RV systolic pressure increased; PA peak pressure 89mmHg  . TUBAL LIGATION    . UPPER GI ENDOSCOPY  01/04/2014   Procedure: UPPER GI ENDOSCOPY;  Surgeon: Greer Pickerel, MD;  Location: [facility];  Service: General;;    SOCIAL HISTORY: Social History   Tobacco Use  . Smoking status: Never Smoker  . Smokeless tobacco: Never Used  Substance Use Topics  . Alcohol use: No  . Drug use: No    FAMILY HISTORY:  Family History  Problem Relation Age of Onset  . Diabetes Father   . Hyperlipidemia Father   .  Hypertension Father   . Heart disease Father   . Sudden death Father   . Anxiety disorder Father   . Obesity Father   . Uterine cancer Mother   . Cervical cancer Mother   . Breast cancer Mother   . Cancer Mother        cervical and brreast cancer  . Hypertension Mother   . Hyperlipidemia Mother   . Obesity Mother   . Diabetes Maternal Grandmother    ROS: Review of Systems  Gastrointestinal: Negative for nausea and vomiting.  Musculoskeletal:       Negative for muscle weakness.  Psychiatric/Behavioral: Positive for depression (emotional eating). Negative for suicidal ideas.       Negative for homicidal ideas.   PHYSICAL EXAM: Pt in no acute distress  RECENT LABS AND TESTS: BMET    Component Value Date/Time   NA 144 03/06/2018 1319   K 4.6 03/06/2018 1319   CL 100 03/06/2018 1319   CO2 25 03/06/2018 1319   GLUCOSE 78 03/06/2018 1319   GLUCOSE 141 (H) 05/18/2015 0456   BUN 16 03/06/2018 1319   CREATININE 0.85 03/06/2018 1319   CALCIUM 10.5 (H) 03/06/2018 1319   GFRNONAA 73 03/06/2018 1319   GFRAA 84 03/06/2018 1319   Lab Results  Component Value Date   HGBA1C 5.5 07/29/2018   HGBA1C 5.5 03/06/2018   HGBA1C 5.7 (H) 12/02/2017   HGBA1C 5.5 05/02/2017   HGBA1C 5.9 (H) 01/01/2017   Lab Results  Component Value Date   INSULIN 9.8 07/29/2018   INSULIN 6.5 03/06/2018   INSULIN 9.2 12/02/2017   INSULIN 7.9 05/02/2017   INSULIN 9.8 01/01/2017   CBC    Component Value Date/Time   WBC 7.6 01/01/2017 1335   WBC 8.0 05/18/2015 0456   RBC 4.91 01/01/2017 1335   RBC 3.29 (L) 05/18/2015 0456   HGB 13.6 01/01/2017 1335   HCT 42.6 01/01/2017 1335   PLT 117 (L) 05/18/2015 0456   MCV 87 01/01/2017 1335   MCH 27.7 01/01/2017 1335   MCH 29.2 05/18/2015 0456   MCHC 31.9 01/01/2017 1335   MCHC 32.3 05/18/2015 0456   RDW 14.7 01/01/2017 1335   LYMPHSABS 1.9 01/01/2017 1335   MONOABS 0.8 03/26/2015 0316   EOSABS 0.2 01/01/2017 1335   BASOSABS 0.1 01/01/2017 1335    Iron/TIBC/Ferritin/ %Sat No results found for: IRON, TIBC, FERRITIN, IRONPCTSAT Lipid Panel     Component Value Date/Time   CHOL 179 03/06/2018 1319   TRIG 84 03/06/2018 1319   HDL 64 03/06/2018 1319   CHOLHDL 2.8 03/06/2018 1319   CHOLHDL 4.2 07/02/2013 1301   VLDL 43 (H) 07/02/2013 1301   LDLCALC 98 03/06/2018 1319   Hepatic Function Panel     Component Value Date/Time   PROT 7.6 03/06/2018 1319   ALBUMIN 4.6 03/06/2018 1319   AST 19 03/06/2018 1319   ALT 17 03/06/2018 1319   ALKPHOS 85 03/06/2018 1319   BILITOT 0.5 03/06/2018 1319      Component Value Date/Time   TSH 3.740 07/29/2018 0906   TSH 3.580 03/06/2018 1319   TSH 3.090 05/02/2017 0850   Results for Nidiffer, FRAYDA EGLEY (MRN [id number]) as of 11/06/2018 09:29  Ref. Range 07/29/2018 09:06  Vitamin D, 25-Hydroxy Latest Ref Range: 30.0 - 100.0 ng/mL 32.1   I, Michaelene Song, am acting as Location manager for Dennard Nip, MD  I have reviewed the above documentation for accuracy and completeness, and I agree with the above. -Dennard Nip, MD

## 2018-11-07 ENCOUNTER — Encounter: Payer: Self-pay | Admitting: Physician Assistant

## 2018-11-07 ENCOUNTER — Other Ambulatory Visit: Payer: Self-pay | Admitting: Physician Assistant

## 2018-11-07 DIAGNOSIS — N814 Uterovaginal prolapse, unspecified: Secondary | ICD-10-CM

## 2018-12-01 ENCOUNTER — Other Ambulatory Visit: Payer: BC Managed Care – PPO

## 2018-12-01 ENCOUNTER — Encounter: Payer: Self-pay | Admitting: Physician Assistant

## 2018-12-01 ENCOUNTER — Ambulatory Visit (INDEPENDENT_AMBULATORY_CARE_PROVIDER_SITE_OTHER): Payer: BC Managed Care – PPO | Admitting: Family Medicine

## 2018-12-01 ENCOUNTER — Other Ambulatory Visit: Payer: Self-pay | Admitting: *Deleted

## 2018-12-01 ENCOUNTER — Telehealth: Payer: Self-pay | Admitting: Physician Assistant

## 2018-12-01 ENCOUNTER — Other Ambulatory Visit: Payer: Self-pay

## 2018-12-01 DIAGNOSIS — R319 Hematuria, unspecified: Secondary | ICD-10-CM

## 2018-12-01 LAB — URINALYSIS, COMPLETE
Bilirubin, UA: NEGATIVE
Glucose, UA: NEGATIVE
Ketones, UA: NEGATIVE
Leukocytes,UA: NEGATIVE
Nitrite, UA: POSITIVE — AB
Specific Gravity, UA: >1.03 — ABNORMAL HIGH (ref 1.005–1.030)
Urobilinogen, Ur: 0.2 mg/dL (ref 0.2–1.0)
pH, UA: 5.5 (ref 5.0–7.5)

## 2018-12-01 LAB — MICROSCOPIC EXAMINATION
RBC, Urine: >30 /hpf — AB (ref 0–2)
Renal Epithel, UA: NONE SEEN /hpf

## 2018-12-01 NOTE — Telephone Encounter (Signed)
Pt called = order in for urine.

## 2018-12-02 ENCOUNTER — Other Ambulatory Visit: Payer: Self-pay | Admitting: Physician Assistant

## 2018-12-02 MED ORDER — NITROFURANTOIN MONOHYD MACRO 100 MG PO CAPS: 100.0000 mg | ORAL_CAPSULE | Freq: Two times a day (BID) | ORAL | 0 refills | Status: DC

## 2018-12-03 LAB — URINE CULTURE

## 2018-12-04 ENCOUNTER — Other Ambulatory Visit: Payer: Self-pay | Admitting: Physician Assistant

## 2018-12-04 MED ORDER — CIPROFLOXACIN HCL 500 MG PO TABS: 500.0000 mg | ORAL_TABLET | Freq: Two times a day (BID) | ORAL | 0 refills | Status: DC

## 2018-12-04 NOTE — Progress Notes (Signed)
ipro

## 2018-12-05 ENCOUNTER — Other Ambulatory Visit: Payer: Self-pay | Admitting: Internal Medicine

## 2018-12-05 ENCOUNTER — Ambulatory Visit: Payer: BC Managed Care – PPO | Admitting: Physician Assistant

## 2018-12-05 DIAGNOSIS — R600 Localized edema: Secondary | ICD-10-CM

## 2018-12-08 ENCOUNTER — Ambulatory Visit (INDEPENDENT_AMBULATORY_CARE_PROVIDER_SITE_OTHER): Payer: BC Managed Care – PPO | Admitting: Family Medicine

## 2018-12-18 ENCOUNTER — Ambulatory Visit (INDEPENDENT_AMBULATORY_CARE_PROVIDER_SITE_OTHER): Payer: BC Managed Care – PPO | Admitting: Family Medicine

## 2018-12-18 ENCOUNTER — Encounter (INDEPENDENT_AMBULATORY_CARE_PROVIDER_SITE_OTHER): Payer: Self-pay | Admitting: Family Medicine

## 2018-12-18 ENCOUNTER — Other Ambulatory Visit: Payer: Self-pay

## 2018-12-18 VITALS — BP 123/83 | HR 71 | Temp 98.3°F | Ht 61.0 in | Wt 236.0 lb

## 2018-12-18 DIAGNOSIS — E559 Vitamin D deficiency, unspecified: Secondary | ICD-10-CM

## 2018-12-18 DIAGNOSIS — Z6841 Body Mass Index (BMI) 40.0 and over, adult: Secondary | ICD-10-CM

## 2018-12-18 DIAGNOSIS — R5383 Other fatigue: Secondary | ICD-10-CM | POA: Diagnosis not present

## 2018-12-18 DIAGNOSIS — Z9189 Other specified personal risk factors, not elsewhere classified: Secondary | ICD-10-CM | POA: Diagnosis not present

## 2018-12-18 DIAGNOSIS — F418 Other specified anxiety disorders: Secondary | ICD-10-CM

## 2018-12-18 MED ORDER — ESCITALOPRAM OXALATE 10 MG PO TABS: 10.0000 mg | ORAL_TABLET | ORAL | 0 refills | Status: DC

## 2018-12-18 MED ORDER — VITAMIN D (ERGOCALCIFEROL) 1.25 MG (50000 UNIT) PO CAPS: 50000.0000 [IU] | ORAL_CAPSULE | ORAL | 0 refills | Status: DC

## 2018-12-19 ENCOUNTER — Encounter: Payer: Self-pay | Admitting: Internal Medicine

## 2018-12-19 LAB — COMPREHENSIVE METABOLIC PANEL
ALT: 17 IU/L (ref 0–32)
AST: 20 IU/L (ref 0–40)
Albumin/Globulin Ratio: 1.5 (ref 1.2–2.2)
Albumin: 4.4 g/dL (ref 3.8–4.8)
Alkaline Phosphatase: 75 IU/L (ref 39–117)
BUN/Creatinine Ratio: 24 (ref 12–28)
BUN: 19 mg/dL (ref 8–27)
Bilirubin Total: 0.5 mg/dL (ref 0.0–1.2)
CO2: 24 mmol/L (ref 20–29)
Calcium: 10 mg/dL (ref 8.7–10.3)
Chloride: 101 mmol/L (ref 96–106)
Creatinine, Ser: 0.79 mg/dL (ref 0.57–1.00)
GFR calc Af Amer: 91 mL/min/{1.73_m2} (ref >59)
GFR calc non Af Amer: 79 mL/min/{1.73_m2} (ref >59)
Globulin, Total: 2.9 g/dL (ref 1.5–4.5)
Glucose: 85 mg/dL (ref 65–99)
Potassium: 4 mmol/L (ref 3.5–5.2)
Sodium: 141 mmol/L (ref 134–144)
Total Protein: 7.3 g/dL (ref 6.0–8.5)

## 2018-12-19 LAB — INSULIN, RANDOM: INSULIN: 7.1 u[IU]/mL (ref 2.6–24.9)

## 2018-12-19 LAB — CBC WITH DIFFERENTIAL
Basophils Absolute: 0.1 10*3/uL (ref 0.0–0.2)
Basos: 1 %
EOS (ABSOLUTE): 0.2 10*3/uL (ref 0.0–0.4)
Eos: 3 %
Hematocrit: 41.5 % (ref 34.0–46.6)
Hemoglobin: 13.4 g/dL (ref 11.1–15.9)
Immature Grans (Abs): 0 10*3/uL (ref 0.0–0.1)
Immature Granulocytes: 0 %
Lymphocytes Absolute: 1.4 10*3/uL (ref 0.7–3.1)
Lymphs: 21 %
MCH: 28.2 pg (ref 26.6–33.0)
MCHC: 32.3 g/dL (ref 31.5–35.7)
MCV: 87 fL (ref 79–97)
Monocytes Absolute: 0.6 10*3/uL (ref 0.1–0.9)
Monocytes: 9 %
Neutrophils Absolute: 4.3 10*3/uL (ref 1.4–7.0)
Neutrophils: 66 %
RBC: 4.75 x10E6/uL (ref 3.77–5.28)
RDW: 12.9 % (ref 11.7–15.4)
WBC: 6.5 10*3/uL (ref 3.4–10.8)

## 2018-12-19 LAB — FOLATE: Folate: >20 ng/mL (ref >3.0)

## 2018-12-19 LAB — LIPID PANEL WITH LDL/HDL RATIO
Cholesterol, Total: 177 mg/dL (ref 100–199)
HDL: 79 mg/dL (ref >39)
LDL Chol Calc (NIH): 85 mg/dL (ref 0–99)
LDL/HDL Ratio: 1.1 ratio (ref 0.0–3.2)
Triglycerides: 68 mg/dL (ref 0–149)
VLDL Cholesterol Cal: 13 mg/dL (ref 5–40)

## 2018-12-19 LAB — HEMOGLOBIN A1C
Est. average glucose Bld gHb Est-mCnc: 108 mg/dL
Hgb A1c MFr Bld: 5.4 % (ref 4.8–5.6)

## 2018-12-19 LAB — T4, FREE: Free T4: 1.3 ng/dL (ref 0.82–1.77)

## 2018-12-19 LAB — VITAMIN D 25 HYDROXY (VIT D DEFICIENCY, FRACTURES): Vit D, 25-Hydroxy: 41.3 ng/mL (ref 30.0–100.0)

## 2018-12-19 LAB — TSH: TSH: 1.66 u[IU]/mL (ref 0.450–4.500)

## 2018-12-19 LAB — T3: T3, Total: 92 ng/dL (ref 71–180)

## 2018-12-19 LAB — VITAMIN B12: Vitamin B-12: 853 pg/mL (ref 232–1245)

## 2018-12-21 ENCOUNTER — Encounter: Payer: Self-pay | Admitting: Physician Assistant

## 2018-12-23 ENCOUNTER — Other Ambulatory Visit: Payer: Self-pay

## 2018-12-23 ENCOUNTER — Encounter: Payer: Self-pay | Admitting: Internal Medicine

## 2018-12-23 ENCOUNTER — Ambulatory Visit: Payer: BC Managed Care – PPO | Admitting: Internal Medicine

## 2018-12-23 ENCOUNTER — Other Ambulatory Visit: Payer: Self-pay | Admitting: Physician Assistant

## 2018-12-23 VITALS — BP 141/105 | HR 73 | Ht 61.0 in | Wt 234.0 lb

## 2018-12-23 DIAGNOSIS — R600 Localized edema: Secondary | ICD-10-CM

## 2018-12-23 DIAGNOSIS — I480 Paroxysmal atrial fibrillation: Secondary | ICD-10-CM

## 2018-12-23 DIAGNOSIS — I10 Essential (primary) hypertension: Secondary | ICD-10-CM | POA: Diagnosis not present

## 2018-12-23 MED ORDER — CIPROFLOXACIN HCL 500 MG PO TABS: 500.0000 mg | ORAL_TABLET | Freq: Two times a day (BID) | ORAL | 0 refills | Status: DC

## 2018-12-23 MED ORDER — DILTIAZEM HCL ER COATED BEADS 120 MG PO CP24: ORAL_CAPSULE | ORAL | 3 refills | Status: DC

## 2018-12-23 MED ORDER — FUROSEMIDE 20 MG PO TABS: ORAL_TABLET | ORAL | 3 refills | Status: DC

## 2018-12-23 MED ORDER — FLECAINIDE ACETATE 100 MG PO TABS: ORAL_TABLET | ORAL | 1 refills | Status: DC

## 2018-12-23 MED ORDER — DILTIAZEM HCL 30 MG PO TABS: ORAL_TABLET | ORAL | 1 refills | Status: DC

## 2018-12-23 NOTE — Patient Instructions (Signed)
Medication Instructions:  Your physician recommends that you continue on your current medications as directed. Please refer to the Current Medication list given to you today.  If you need a refill on your cardiac medications before your next appointment, please call your pharmacy.    Follow-Up: At [facility], you and your health needs are our priority.  As part of our continuing mission to provide you with exceptional heart care, we have created designated Provider Care Teams.  These Care Teams include your primary Cardiologist (physician) and Advanced Practice Providers (APPs -  Physician Assistants and Nurse Practitioners) who all work together to provide you with the care you need, when you need it. You will need a follow up appointment in 12 months.  Please call our office 2 months in advance to schedule this appointment.  You may see Dr. Hilty or one of the following Advanced Practice Providers on your designated Care Team: Hao Meng, PA-C . Angela Duke, PA-C  Any Other Special Instructions Will Be Listed Below (If Applicable).    

## 2018-12-23 NOTE — Progress Notes (Signed)
OFFICE NOTE  Chief Complaint:  Occasional palpitations  Primary Care Physician: Terald Sleeper, PA-C  HPI:  Rebekah [name] is a pleasant 63 year old female with a past medical history significant for her super morbid obesity, chronic hypoxia, hypertension, GERD and prior knee replacement. She was previously followed by Dr. Rollene [name] and was being evaluated for bariatric surgery. She is here to establish new care with me today and for preoperative evaluation. 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 She appears he seen Dr. Rollene [name] will order an echocardiogram on August of 2014 which showed moderate thickening of the left ventricle with EF of 60-65%, mildly increased RV systolic pressure and no significant valvular disease. There was left atrial enlargement. She denies any chest pain but does have expected shortness of breath with exertion. Lower extremity venous Dopplers were performed which show no evidence of significant venous insufficiency.    Rebekah [name] returns today for follow-up. She seems to be doing remarkably well. She underwent gastric sleeve surgery and has had close to 60 pound weight loss. Unfortunately she was complicated by postoperative atrial fibrillation. She was seen by Dr. Martinique and placed on Cardizem. Subsequently she converted to sinus rhythm within 24 hours spontaneously. She's had no recurrence since that time and has not been additionally anticoagulated. She was not continued on Cardizem. She does have an elevated CHADSVASC score of 2.  I saw Rebekah [name] back in the office today. Please see her recent notes when I saw her in the emergency room at [facility]. She was noted to be in atrial fibrillation again and given flecainide to help convert her, but this was not successful. She is placed on diltiazem and in addition to  metoprolol. Rate slowed down and eventually she cardioverted on her own. We also started her on Xarelto which she's continues to take. Overall she's had very good rate control his heart rate is remained in the mid 40s to mid 60s. Blood pressure is somewhat labile and ranges between the low 90s up to the 140s. Overall she feels fairly well. She still has a few episodes of palpitations during the week when she feels that she is in A. fib although she feels like she is in A. fib today and her rhythm is sinus. She may not be sensitive to A. fib anymore. 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  I saw Rebekah [name] back in the office today. She's had remarkable weight loss. In fact her weight after sleeve gastrectomy is down 288 pounds. Her main issue now is excess tissue for which she's tried to get plastic surgery but has been denied multiple times. She reports no further palpitations or atrial fibrillation. She continues to take Xarelto without any bleeding complications. She continues to use nocturnal oxygen due to hypoxia, although her sleep study indicated no sleep apnea, her overnight oximetry study did indicate hypoxia with a low O2 sats ration of 86%. This was, of course prior to the recent 80-100 pound weight loss. She also reports recently she's been having epistaxis. In fact she had an episode that lasted for 10-12 hours, she used a box of 40 micro-tampons to try to get it to stop and then eventually tried to go to the emergency room. She is using nasal saline but feels that the oxygen at night is probably drying  her nose out.  Rebekah [name] was seen today for an urgent add-on. This morning she was awakened by her alarm clock around 5 AM and noticed that her heart was racing when she woke up. It did not slow down right away and she felt increasingly more fatigue. She said after taking a shower she felt dizzy and had some chest pressure. Her heart rate was very elevated and she tried to take  it noting it was "greater than 200". She decided then take 300 mg of flecainide. Subsequently she took 2 diltiazem's and after several hours he decided to come in for her appointment with the orthopedist. She says that on the way in to the office her heart rate slowed down and she felt better. When she saw her orthopedist they felt that she should be reevaluated by [name] here. Currently she is asymptomatic. EKG was performed showing sinus rhythm with sinus arrhythmia at 81.  Rebekah [name] returns today for follow-up. She had been reporting some worsening leg swelling however this seems to be improved somewhat today. She has had no recurrent paroxysmal arrhythmias. She continues to want to use the pocket pill strategy.  11/21/2016  Rebekah [name] returns a for follow-up. Is been over year since I last saw her. She was last seen in the A. fib clinic for follow-up and is reported very infrequent use of flecainide. She says that she's used it a few times over the past year and has been successful in converting her to sinus rhythm. Of most notable is that she's had significant weight gain. Weight in March 2017 was 188 pounds and now up to 271 pounds. She was 204 pounds in January. She says this is typical to many factors including immobility after foot surgery, social stressors, brother who had a stroke and poor eating habits. She has a significant understanding of nutrition and her dietary needs but has had difficulty balancing that. She reports that she is consuming only 600-700 cal a day and has lost 20 pounds but has recently plateaued.  03/15/2017  Rebekah [name] returns today for follow-up.  I last saw her about 3 months ago.  We were working on her blood pressure which has been elevated.  Mostly it appears that her blood pressure is elevated in the morning.  She does not have sleep apnea however has had infrequent nocturnal oxygen desaturations and uses oxygen at night.  This may be contributing to her elevated blood  pressure in the morning.  Recently she has been taking extra losartan 50 mg at night but then takes 25 mg in the morning.  I reviewed her blood home blood pressures and they appear to be well controlled between [id number] systolic and the 0000000 and [id number] diastolic.  She reports infrequent atrial fibrillation.  The episodes however now are longer lasting and may last throughout the day, despite taking her flecainide.  They eventually go away however it is not clear if it works as quickly.  We considered and discussed monitoring and I presented to her an option of using the alive core application.  01/03/2018  Rebekah [name] is seen today in follow-up.  She reported recently having had some occasional palpitations.  She thought she experienced an allergic reaction and took up 200 mg of Benadryl over 4 hours.  Absolutely she had some A. fib and then she took her flecainide within 24 hours.  I strongly advised against this at this point as there is significant interaction between Benadryl and flecainide which could potentiate  the levels of flecainide and potentially cause QT prolongation.  He had considered using her EpiPen, and I supported this if she ever feels that she may be having anaphylaxis.  Overall, she denies any chest pain or worsening shortness of breath.  She is been working with the weight management center and unfortunately has not lost a lot of weight but recently started on the original Atkins diet and has had some benefit there.  Blood pressures were running slightly higher and she increased her medicine dose to 50 mg twice daily of the losartan.  I reviewed her home blood pressure readings and the appear to be well controlled.  She is tolerating Xarelto without any difficulty.  And as I mentioned she is only had a couple of episodes of A. fib for which she has needed the pocket pill flecainide.  12/23/2018  Rebekah [name] is seen today in follow-up.  She has had a couple episodes of persistent atrial  fibrillation despite taking flecainide and diltiazem.  Overall though she says her episodes are short and typically occur less than 1 time a month.  Her blood pressure is elevated today however she feels there is a whitecoat component to this.  Her home blood pressures were reviewed and seem to be normal.  Recently she has had some readings suggestive of some daytime hypoxia.  She continues to use some oxygen at night although feels no different on the days she does not use it.  She did have a repeat echocardiogram which showed normal systolic function and no evidence of pulmonary hypertension.  For some reason her oxygen saturations have improved recently.  She continues to lose some weight and is working with Dr. Leafy Ro.    PMHx:  Past Medical History:  Diagnosis Date   Anxiety    Arthritis    Bilateral bunions    Complication of anesthesia    Difficulty sleeping    prior sleep study did not reveal sleep apnea per patient   DJD (degenerative joint disease)    Dyspnea    Dysrhythmia    a-fib   Environmental allergies    Food allergy    Gallbladder problem    GERD (gastroesophageal reflux disease)    Heart murmur    Hypertension    no meds after weight loss   Hypothyroidism    Incontinence of urine    at nite    Joint pain    Left atrial dilatation    Left foot pain    Leg edema    Obesity    s/p gastric sleeve 12/2013 (previously weighed close to 400 lbs)   Osteoarthritis    Palpitations    Paroxysmal atrial fibrillation (HCC)    Pneumonia    hx of    PONV (postoperative nausea and vomiting)    Status post bilateral knee replacements    Supplemental oxygen dependent    uses 2l/[place] at night, states HR goes low and O2 drops   Tuberculosis    had 6 month of INH due to exposure    Vaginal vault prolapse     Past Surgical History:  Procedure Laterality Date   APPENDECTOMY     CHOLECYSTECTOMY     EYE SURGERY     03/2014 lens implant left  lens    FOOT ARTHRODESIS Left 03/15/2016   Procedure: TALONAVICULAR AND SUBTALAR ARTHRODESIS;  Surgeon: Wylene Simmer, MD;  Location: [place];  Service: Orthopedics;  Laterality: Left;   GASTROC RECESSION EXTREMITY Left 03/15/2016  Procedure: LEFT GASTROC RECESSION;  Surgeon: Wylene Simmer, MD;  Location: [place];  Service: Orthopedics;  Laterality: Left;   JOINT REPLACEMENT  1999   L TOTAL KNEE   LAPAROSCOPIC GASTRIC SLEEVE RESECTION N/A 01/04/2014   Procedure: LAPAROSCOPIC GASTRIC SLEEVE RESECTION;  Surgeon: Greer Pickerel, MD;  Location: [facility];  Service: General;  Laterality: N/A;   TONSILLECTOMY     TOTAL KNEE ARTHROPLASTY Right 05/17/2015   Procedure: RIGHT TOTAL KNEE ARTHROPLASTY;  Surgeon: Paralee Cancel, MD;  Location: [facility];  Service: Orthopedics;  Laterality: Right;   TRANSTHORACIC ECHOCARDIOGRAM  11/2012   EF 60-65%, mod LVH & mod conc hypertrophy, grade 1 diastolic dysfunction; LA mildly dilated; RV systolic pressure increased; PA peak pressure 23mmHg   TUBAL LIGATION     UPPER GI ENDOSCOPY  01/04/2014   Procedure: UPPER GI ENDOSCOPY;  Surgeon: Greer Pickerel, MD;  Location: [facility];  Service: General;;    FAMHx:  Family History  Problem Relation Age of Onset   Diabetes Father    Hyperlipidemia Father    Hypertension Father    Heart disease Father    Sudden death Father    Anxiety disorder Father    Obesity Father    Uterine cancer Mother    Cervical cancer Mother    Breast cancer Mother    Cancer Mother        cervical and brreast cancer   Hypertension Mother    Hyperlipidemia Mother    Obesity Mother    Diabetes Maternal Grandmother     SOCHx:   reports that she has never smoked. She has never used smokeless tobacco. She reports that she does not drink alcohol or use drugs.  ALLERGIES:  Allergies  Allergen Reactions   Beef-Derived Products Anaphylaxis    After tick bite, cannot eat beef, pork or lamb   Lambs  Quarters Anaphylaxis    After tick bite cannot eat beef, pork or lamb   Mucinex [Guaifenesin Er] Anaphylaxis   Pork-Derived Products Anaphylaxis    After tick bite cannot eat beef, pork or lamb   Darvon [Propoxyphene] Other (See Comments)    Hallucinations    Ace Inhibitors Swelling and Cough    Pedal Edema   Ppd [Tuberculin Purified Protein Derivative]     Always has positive testing to PPD, do not use   Hydromet [Hydrocodone-Homatropine] Itching and Other (See Comments)    Severe stomach pain-face itches.    Sulfonamide Derivatives Rash    ROS: Pertinent items noted in HPI and remainder of comprehensive ROS otherwise negative.  HOME MEDS: Current Outpatient Medications  Medication Sig Dispense Refill   Fexofenadine HCl (ALLEGRA PO) Take by mouth daily.     albuterol (PROVENTIL HFA;VENTOLIN HFA) 108 (90 Base) MCG/ACT inhaler Inhale 2 puffs into the lungs every 6 (six) hours as needed for wheezing or shortness of breath. 1 Inhaler 0   CALCIUM PO Take 500 mg by mouth daily.      CARTIA XT 120 MG 24 hr capsule TAKE 1 CAPSULE BY MOUTH ONCE DAILY (NEEDS  APPOINTMENT  FOR  REFILLS) 90 capsule 2   ciprofloxacin (CIPRO) 500 MG tablet Take 1 tablet (500 mg total) by mouth 2 (two) times daily. 20 tablet 0   desmopressin (DDAVP) 0.2 MG tablet Take 3 tablets (0.6 mg total) by mouth at bedtime. 30 tablet 11   diltiazem (CARDIZEM) 30 MG tablet TAKE ONE TO TWO TABLETS BY MOUTH EVERY 6 HOURS AS NEEDED FOR  FAST  HEART  RATES 90 tablet 0   EPINEPHrine (EPIPEN) 0.3 mg/0.3 mL IJ SOAJ injection Inject 0.3 mLs (0.3 mg total) into the muscle once as needed (anaphylaxis). 1 Device 6   escitalopram (LEXAPRO) 10 MG tablet Take 1 tablet (10 mg total) by mouth every morning. 30 tablet 0   flecainide (TAMBOCOR) 100 MG tablet Take 3 tablets by mouth at onset of fast heart rate as needed. 270 tablet 0   furosemide (LASIX) 20 MG tablet TAKE 1 TABLET BY MOUTH ONCE DAILY AS NEEDED FOR EDEMA 30  tablet 0   levothyroxine (SYNTHROID) 50 MCG tablet TAKE 1 TABLET BY MOUTH ONCE DAILY BEFORE BREAKFAST 90 tablet 3   losartan (COZAAR) 50 MG tablet Take 1 tablet (50 mg total) by mouth 2 (two) times daily. 180 tablet 3   Multiple Vitamins-Minerals (MULTIVITAMIN ADULT PO) Take 1 capsule by mouth 2 (two) times daily. Journey 3+3 Bariatric Multivitamin     OXYGEN Inhale 3 L/min into the lungs at bedtime. Continuously     rivaroxaban (XARELTO) 20 MG TABS tablet TAKE 1 TABLET BY MOUTH ONCE DAILY WITH SUPPER 90 tablet 1   traMADol (ULTRAM) 50 MG tablet Take 1 tablet (50 mg total) by mouth every 6 (six) hours as needed. 60 tablet 2   Vitamin D, Ergocalciferol, (DRISDOL) 1.25 MG (50000 UT) CAPS capsule Take 1 capsule (50,000 Units total) by mouth every 7 (seven) days. 4 capsule 0   No current facility-administered medications for this visit.     LABS/IMAGING: No results found for this or any previous visit (from the past 48 hour(s)). No results found.  VITALS: BP (!) 141/105    Pulse 73    Ht 5\' 1"  (1.549 m)    Wt 234 lb (106.1 kg)    SpO2 96%    BMI 44.21 kg/m   EXAM: General appearance: alert, no distress and morbidly obese Neck: no carotid bruit, no JVD and thyroid not enlarged, symmetric, no tenderness/mass/nodules Lungs: clear to auscultation bilaterally Heart: regular rate and rhythm, S1, S2 normal, no murmur, click, rub or gallop Abdomen: soft, non-tender; bowel sounds normal; no masses,  no organomegaly Extremities: No ankle edema, excess tissue and firmness around the upper calf and knee areas Pulses: 2+ and symmetric Skin: Skin color, texture, turgor normal. No rashes or lesions Neurologic: Grossly normal Psych: Pleasant  EKG: Sinus rhythm with sinus arrhythmia at 73, nonspecific T wave changes-personally reviewed  ASSESSMENT: 1. Paroxysmal atrial fibrillation, recurrent- on pocket pill therapy 2. Bilateral leg edema - minimal 3. Morbid obesity with significant weight  gain and prior bariatric surgery 4. Essential hypertension  PLAN: 1.    Rebekah [name] has had a couple episodes of A. fib which did not significantly improve with flecainide and diltiazem however ultimately abated on their own.  Overall she is pleased with her control and the episodes are very infrequent.  She remains anticoagulated.  She continues to lose weight and is working with Dr. Leafy Ro.  Her blood pressure is well controlled at home.  She had recent hypoxia however echo was normal with no evidence of pulmonary hypertension.  Follow-up in 1 year.  Pixie Casino, MD, [facility], [place] Director of the Advanced Lipid Disorders &  Cardiovascular Risk Reduction Clinic Diplomate of the American Board of Clinical Lipidology Attending Cardiologist  Direct Dial: [phone]   Fax: [phone]  Website:  [email or web address] 12/23/2018, 3:57 PM

## 2018-12-23 NOTE — Progress Notes (Signed)
cipro

## 2018-12-23 NOTE — Progress Notes (Signed)
Office: [phone]  /  Fax: [phone]   HPI:   Chief Complaint: OBESITY Rebekah [name] is here to discuss her progress with her obesity treatment plan. She is on the keep a food journal with 1200 calories and 80+ grams of protein daily and she is following Atkins and counting sugars. Rebekah [name] is following her eating plan approximately 80 % of the time. She states she is doing gardening and yard work for exercise. Rebekah [name]'s last in-office visit was six months ago. She feels she has done well with maintaining her weight until last month, but she has been sick and she increased simple carbohydrates. Rebekah [name] is doing her own plan and she is eating out a lot and she is increasing fried foods. Her weight is 236 lb (107 kg) today and she has had a weight loss of 8 pounds since her last in-office visit. She has lost 31 lbs since starting treatment with [name].  Caregiver Fatigue Sammy is caring for her grown brother who shows narcissistic tendencies per the patient. She is overwhelmed and feels unappreciated for everything she does for him.  Depression with emotional eating behaviors Rebekah [name] struggles with emotional eating and using food for comfort to the extent that it is negatively impacting her health. She often snacks when she is not hungry. Rebekah [name] sometimes feels she is out of control and then feels guilty that she made poor food choices. Her mood is stable on Lexapro, but she is still overwhelmed with caring for her brother. She has been working on behavior modification techniques to help reduce her emotional eating and has been somewhat successful. She shows no sign of suicidal or homicidal ideations.  Vitamin D deficiency Rebekah [name] has a diagnosis of vitamin D deficiency. Rebekah [name] is stable on vit D and she denies nausea, vomiting or muscle weakness.  At risk for cardiovascular disease Rebekah [name] is at a higher than average risk for cardiovascular disease due to obesity. She currently denies any chest pain.  ASSESSMENT AND  PLAN:  Caregiver with fatigue  Vitamin D deficiency - Plan: Vitamin B12, CBC With Differential, Comprehensive metabolic panel, Folate, Hemoglobin A1c, Insulin, random, Lipid Panel With LDL/HDL Ratio, T3, T4, free, TSH, VITAMIN D 25 Hydroxy (Vit-D Deficiency, Fractures), Vitamin D, Ergocalciferol, (DRISDOL) 1.25 MG (50000 UT) CAPS capsule  Depression with anxiety - Plan: escitalopram (LEXAPRO) 10 MG tablet  At risk for heart disease  Class 3 severe obesity with serious comorbidity and body mass index (BMI) of 40.0 to 44.9 in adult, unspecified obesity type [facility])  PLAN:  Caregiver Fatigue Patient was offered counseling and she said she would consider it.  Depression with Emotional Eating Behaviors We discussed behavior modification techniques today to help Rebekah [name] deal with her emotional eating and depression. She has agreed to continue Lexapro 10 mg daily in the morning #30 with no refills and follow up as directed. Rebekah [name] will consider counseling.  Vitamin D Deficiency Rebekah [name] was informed that low vitamin D levels contributes to fatigue and are associated with obesity, breast, and colon cancer. She agrees to continue to take prescription Vit D @50 ,000 IU every week #4 with no refills and she will follow up for routine testing of vitamin D, at least 2-3 times per year. She was informed of the risk of over-replacement of vitamin D and agrees to not increase her dose unless she discusses this with [name] first. We will check labs and Rebekah [name] agrees to follow up with our clinic in 4 weeks.  Cardiovascular risk counseling Rebekah [name] was given extended (  15 minutes) coronary artery disease prevention counseling today. She is 63 y.o. female and has risk factors for heart disease including obesity. We discussed intensive lifestyle modifications today with an emphasis on specific weight loss instructions and strategies. Pt was also informed of the importance of increasing exercise and decreasing saturated fats to help  prevent heart disease.  Obesity Rebekah [name] is currently in the action stage of change. As such, her goal is to continue with weight loss efforts She has agreed to portion control better and make smarter food choices, such as increase vegetables and decrease simple carbohydrates  Rebekah [name] has been instructed to work up to a goal of 150 minutes of combined cardio and strengthening exercise per week for weight loss and overall health benefits. We discussed the following Behavioral Modification Strategies today: increasing lean protein intake and decreasing simple carbohydrates   Rebekah [name] has agreed to follow up with our clinic in 4 weeks. She was informed of the importance of frequent follow up visits to maximize her success with intensive lifestyle modifications for her multiple health conditions.  ALLERGIES: Allergies  Allergen Reactions   Beef-Derived Products Anaphylaxis    After tick bite, cannot eat beef, pork or lamb   Lambs Quarters Anaphylaxis    After tick bite cannot eat beef, pork or lamb   Mucinex [Guaifenesin Er] Anaphylaxis   Pork-Derived Products Anaphylaxis    After tick bite cannot eat beef, pork or lamb   Darvon [Propoxyphene] Other (See Comments)    Hallucinations    Ace Inhibitors Swelling and Cough    Pedal Edema   Ppd [Tuberculin Purified Protein Derivative]     Always has positive testing to PPD, do not use   Hydromet [Hydrocodone-Homatropine] Itching and Other (See Comments)    Severe stomach pain-face itches.    Sulfonamide Derivatives Rash    MEDICATIONS: Current Outpatient Medications on File Prior to Visit  Medication Sig Dispense Refill   albuterol (PROVENTIL HFA;VENTOLIN HFA) 108 (90 Base) MCG/ACT inhaler Inhale 2 puffs into the lungs every 6 (six) hours as needed for wheezing or shortness of breath. 1 Inhaler 0   CALCIUM PO Take 500 mg by mouth daily.      CARTIA XT 120 MG 24 hr capsule TAKE 1 CAPSULE BY MOUTH ONCE DAILY (NEEDS  APPOINTMENT  FOR   REFILLS) 90 capsule 2   desmopressin (DDAVP) 0.2 MG tablet Take 3 tablets (0.6 mg total) by mouth at bedtime. 30 tablet 11   diltiazem (CARDIZEM) 30 MG tablet TAKE ONE TO TWO TABLETS BY MOUTH EVERY 6 HOURS AS NEEDED FOR  FAST  HEART  RATES 90 tablet 0   EPINEPHrine (EPIPEN) 0.3 mg/0.3 mL IJ SOAJ injection Inject 0.3 mLs (0.3 mg total) into the muscle once as needed (anaphylaxis). 1 Device 6   flecainide (TAMBOCOR) 100 MG tablet Take 3 tablets by mouth at onset of fast heart rate as needed. 270 tablet 0   furosemide (LASIX) 20 MG tablet TAKE 1 TABLET BY MOUTH ONCE DAILY AS NEEDED FOR EDEMA 30 tablet 0   levothyroxine (SYNTHROID) 50 MCG tablet TAKE 1 TABLET BY MOUTH ONCE DAILY BEFORE BREAKFAST 90 tablet 3   losartan (COZAAR) 50 MG tablet Take 1 tablet (50 mg total) by mouth 2 (two) times daily. 180 tablet 3   Multiple Vitamins-Minerals (MULTIVITAMIN ADULT PO) Take 1 capsule by mouth 2 (two) times daily. Journey 3+3 Bariatric Multivitamin     OXYGEN Inhale 3 L/min into the lungs at bedtime. Continuously     rivaroxaban (  XARELTO) 20 MG TABS tablet TAKE 1 TABLET BY MOUTH ONCE DAILY WITH SUPPER 90 tablet 1   traMADol (ULTRAM) 50 MG tablet Take 1 tablet (50 mg total) by mouth every 6 (six) hours as needed. 60 tablet 2   cetirizine (ZYRTEC) 10 MG tablet Take 10 mg by mouth at bedtime.      No current facility-administered medications on file prior to visit.     PAST MEDICAL HISTORY: Past Medical History:  Diagnosis Date   Anxiety    Arthritis    Bilateral bunions    Complication of anesthesia    Difficulty sleeping    prior sleep study did not reveal sleep apnea per patient   DJD (degenerative joint disease)    Dyspnea    Dysrhythmia    a-fib   Environmental allergies    Food allergy    Gallbladder problem    GERD (gastroesophageal reflux disease)    Heart murmur    Hypertension    no meds after weight loss   Hypothyroidism    Incontinence of urine    at  nite    Joint pain    Left atrial dilatation    Left foot pain    Leg edema    Obesity    s/p gastric sleeve 12/2013 (previously weighed close to 400 lbs)   Osteoarthritis    Palpitations    Paroxysmal atrial fibrillation (HCC)    Pneumonia    hx of    PONV (postoperative nausea and vomiting)    Status post bilateral knee replacements    Supplemental oxygen dependent    uses 2l/[place] at night, states HR goes low and O2 drops   Tuberculosis    had 6 month of INH due to exposure    Vaginal vault prolapse     PAST SURGICAL HISTORY: Past Surgical History:  Procedure Laterality Date   APPENDECTOMY     CHOLECYSTECTOMY     EYE SURGERY     03/2014 lens implant left lens    FOOT ARTHRODESIS Left 03/15/2016   Procedure: TALONAVICULAR AND SUBTALAR ARTHRODESIS;  Surgeon: Wylene Simmer, MD;  Location: [place];  Service: Orthopedics;  Laterality: Left;   GASTROC RECESSION EXTREMITY Left 03/15/2016   Procedure: LEFT GASTROC RECESSION;  Surgeon: Wylene Simmer, MD;  Location: [place];  Service: Orthopedics;  Laterality: Left;   JOINT REPLACEMENT  1999   L TOTAL KNEE   LAPAROSCOPIC GASTRIC SLEEVE RESECTION N/A 01/04/2014   Procedure: LAPAROSCOPIC GASTRIC SLEEVE RESECTION;  Surgeon: Greer Pickerel, MD;  Location: [facility];  Service: General;  Laterality: N/A;   TONSILLECTOMY     TOTAL KNEE ARTHROPLASTY Right 05/17/2015   Procedure: RIGHT TOTAL KNEE ARTHROPLASTY;  Surgeon: Paralee Cancel, MD;  Location: [facility];  Service: Orthopedics;  Laterality: Right;   TRANSTHORACIC ECHOCARDIOGRAM  11/2012   EF 60-65%, mod LVH & mod conc hypertrophy, grade 1 diastolic dysfunction; LA mildly dilated; RV systolic pressure increased; PA peak pressure 82mmHg   TUBAL LIGATION     UPPER GI ENDOSCOPY  01/04/2014   Procedure: UPPER GI ENDOSCOPY;  Surgeon: Greer Pickerel, MD;  Location: [facility];  Service: General;;    SOCIAL HISTORY: Social History   Tobacco Use    Smoking status: Never Smoker   Smokeless tobacco: Never Used  Substance Use Topics   Alcohol use: No   Drug use: No    FAMILY HISTORY: Family History  Problem Relation Age of Onset   Diabetes Father  Hyperlipidemia Father    Hypertension Father    Heart disease Father    Sudden death Father    Anxiety disorder Father    Obesity Father    Uterine cancer Mother    Cervical cancer Mother    Breast cancer Mother    Cancer Mother        cervical and brreast cancer   Hypertension Mother    Hyperlipidemia Mother    Obesity Mother    Diabetes Maternal Grandmother     ROS: Review of Systems  Constitutional: Positive for malaise/fatigue. Negative for weight loss.  Cardiovascular: Negative for chest pain.  Gastrointestinal: Negative for nausea and vomiting.  Musculoskeletal:       Negative for muscle weakness  Psychiatric/Behavioral: Positive for depression. Negative for suicidal ideas.    PHYSICAL EXAM: Blood pressure 123/83, pulse 71, temperature 98.3 F (36.8 C), temperature source Oral, height 5\' 1"  (1.549 m), weight 236 lb (107 kg), SpO2 96 %. Body mass index is 44.59 kg/m. Physical Exam Vitals signs reviewed.  Constitutional:      Appearance: Normal appearance. She is well-developed. She is obese.  Cardiovascular:     Rate and Rhythm: Normal rate.  Pulmonary:     Effort: Pulmonary effort is normal.  Musculoskeletal: Normal range of motion.  Skin:    General: Skin is warm and dry.  Neurological:     Mental Status: She is alert and oriented to person, place, and time.  Psychiatric:        Mood and Affect: Mood normal.        Behavior: Behavior normal.        Thought Content: Thought content does not include homicidal or suicidal ideation.     RECENT LABS AND TESTS: BMET    Component Value Date/Time   NA 141 12/18/2018 0855   K 4.0 12/18/2018 0855   CL 101 12/18/2018 0855   CO2 24 12/18/2018 0855   GLUCOSE 85 12/18/2018 0855    GLUCOSE 141 (H) 05/18/2015 0456   BUN 19 12/18/2018 0855   CREATININE 0.79 12/18/2018 0855   CALCIUM 10.0 12/18/2018 0855   GFRNONAA 79 12/18/2018 0855   GFRAA 91 12/18/2018 0855   Lab Results  Component Value Date   HGBA1C 5.4 12/18/2018   HGBA1C 5.5 07/29/2018   HGBA1C 5.5 03/06/2018   HGBA1C 5.7 (H) 12/02/2017   HGBA1C 5.5 05/02/2017   Lab Results  Component Value Date   INSULIN 7.1 12/18/2018   INSULIN 9.8 07/29/2018   INSULIN 6.5 03/06/2018   INSULIN 9.2 12/02/2017   INSULIN 7.9 05/02/2017   CBC    Component Value Date/Time   WBC 6.5 12/18/2018 0855   WBC 8.0 05/18/2015 0456   RBC 4.75 12/18/2018 0855   RBC 3.29 (L) 05/18/2015 0456   HGB 13.4 12/18/2018 0855   HCT 41.5 12/18/2018 0855   PLT 117 (L) 05/18/2015 0456   MCV 87 12/18/2018 0855   MCH 28.2 12/18/2018 0855   MCH 29.2 05/18/2015 0456   MCHC 32.3 12/18/2018 0855   MCHC 32.3 05/18/2015 0456   RDW 12.9 12/18/2018 0855   LYMPHSABS 1.4 12/18/2018 0855   MONOABS 0.8 03/26/2015 0316   EOSABS 0.2 12/18/2018 0855   BASOSABS 0.1 12/18/2018 0855   Iron/TIBC/Ferritin/ %Sat No results found for: IRON, TIBC, FERRITIN, IRONPCTSAT Lipid Panel     Component Value Date/Time   CHOL 177 12/18/2018 0855   TRIG 68 12/18/2018 0855   HDL 79 12/18/2018 0855   CHOLHDL 2.8 03/06/2018 1319  CHOLHDL 4.2 07/02/2013 1301   VLDL 43 (H) 07/02/2013 1301   LDLCALC 98 03/06/2018 1319   Hepatic Function Panel     Component Value Date/Time   PROT 7.3 12/18/2018 0855   ALBUMIN 4.4 12/18/2018 0855   AST 20 12/18/2018 0855   ALT 17 12/18/2018 0855   ALKPHOS 75 12/18/2018 0855   BILITOT 0.5 12/18/2018 0855      Component Value Date/Time   TSH 1.660 12/18/2018 0855   TSH 3.740 07/29/2018 0906   TSH 3.580 03/06/2018 1319     Ref. Range 07/29/2018 09:06  Vitamin D, 25-Hydroxy Latest Ref Range: 30.0 - 100.0 ng/mL 32.1    OBESITY BEHAVIORAL INTERVENTION VISIT  Today's visit was # 28   Starting weight: 267 lbs Starting  date: 01/01/2017 Today's weight : 236 lbs Today's date: 12/18/2018 Total lbs lost to date: 31    12/18/2018  Height 5\' 1"  (1.549 m)  Weight 236 lb (107 kg)  BMI (Calculated) 44.61  BLOOD PRESSURE - SYSTOLIC [id number]  BLOOD PRESSURE - DIASTOLIC 83   Body Fat % 54 %    ASK: We discussed the diagnosis of obesity with Rebekah [name] today and Rebekah [name] agreed to give [name] permission to discuss obesity behavioral modification therapy today.  ASSESS: Rebekah [name] has the diagnosis of obesity and her BMI today is 44.61 Rebekah [name] is in the action stage of change   ADVISE: Rebekah [name] was educated on the multiple health risks of obesity as well as the benefit of weight loss to improve her health. She was advised of the need for long term treatment and the importance of lifestyle modifications to improve her current health and to decrease her risk of future health problems.  AGREE: Multiple dietary modification options and treatment options were discussed and  Rebekah [name] agreed to follow the recommendations documented in the above note.  ARRANGE: Rebekah [name] was educated on the importance of frequent visits to treat obesity as outlined per CMS and USPSTF guidelines and agreed to schedule her next follow up appointment today.  I, Doreene Nest, am acting as transcriptionist for Dennard Nip, MD  I have reviewed the above documentation for accuracy and completeness, and I agree with the above. -Dennard Nip, MD

## 2018-12-25 ENCOUNTER — Other Ambulatory Visit: Payer: Self-pay

## 2018-12-25 MED ORDER — RIVAROXABAN 20 MG PO TABS: ORAL_TABLET | ORAL | 1 refills | Status: DC

## 2018-12-25 NOTE — Telephone Encounter (Signed)
68f 106.1kg Scr 0.79 12/18/18 ccr 120.2mlmin Lovw/hilty 12/23/18

## 2018-12-29 ENCOUNTER — Other Ambulatory Visit: Payer: Self-pay

## 2019-01-16 ENCOUNTER — Telehealth: Payer: Self-pay | Admitting: *Deleted

## 2019-01-16 ENCOUNTER — Other Ambulatory Visit: Payer: Self-pay | Admitting: Physician Assistant

## 2019-01-16 ENCOUNTER — Encounter: Payer: Self-pay | Admitting: Physician Assistant

## 2019-01-16 MED ORDER — SULFAMETHOXAZOLE-TRIMETHOPRIM 800-160 MG PO TABS: 1.0000 | ORAL_TABLET | Freq: Two times a day (BID) | ORAL | 0 refills | Status: DC

## 2019-01-16 MED ORDER — AMOXICILLIN-POT CLAVULANATE 875-125 MG PO TABS: 1.0000 | ORAL_TABLET | Freq: Two times a day (BID) | ORAL | 0 refills | Status: DC

## 2019-01-16 NOTE — Telephone Encounter (Signed)
done

## 2019-01-16 NOTE — Telephone Encounter (Signed)
Patient is allergic to sulfa can you please change antibiotic

## 2019-01-16 NOTE — Progress Notes (Signed)
bactrim

## 2019-01-19 ENCOUNTER — Ambulatory Visit (INDEPENDENT_AMBULATORY_CARE_PROVIDER_SITE_OTHER): Payer: BC Managed Care – PPO | Admitting: Family Medicine

## 2019-01-26 ENCOUNTER — Ambulatory Visit (INDEPENDENT_AMBULATORY_CARE_PROVIDER_SITE_OTHER): Payer: BC Managed Care – PPO | Admitting: Family Medicine

## 2019-01-29 ENCOUNTER — Encounter (INDEPENDENT_AMBULATORY_CARE_PROVIDER_SITE_OTHER): Payer: Self-pay | Admitting: Family Medicine

## 2019-01-29 NOTE — Telephone Encounter (Signed)
Fyi/ please advise

## 2019-02-03 ENCOUNTER — Encounter: Payer: Self-pay | Admitting: Physician Assistant

## 2019-02-05 ENCOUNTER — Encounter (INDEPENDENT_AMBULATORY_CARE_PROVIDER_SITE_OTHER): Payer: Self-pay | Admitting: Family Medicine

## 2019-02-05 ENCOUNTER — Ambulatory Visit (INDEPENDENT_AMBULATORY_CARE_PROVIDER_SITE_OTHER): Payer: BC Managed Care – PPO | Admitting: Family Medicine

## 2019-02-05 ENCOUNTER — Other Ambulatory Visit: Payer: Self-pay

## 2019-02-05 VITALS — BP 120/76 | HR 65 | Temp 98.2°F | Ht 61.0 in | Wt 247.0 lb

## 2019-02-05 DIAGNOSIS — F3289 Other specified depressive episodes: Secondary | ICD-10-CM | POA: Diagnosis not present

## 2019-02-05 DIAGNOSIS — E559 Vitamin D deficiency, unspecified: Secondary | ICD-10-CM | POA: Diagnosis not present

## 2019-02-05 DIAGNOSIS — E66813 Obesity, class 3: Secondary | ICD-10-CM

## 2019-02-05 DIAGNOSIS — F418 Other specified anxiety disorders: Secondary | ICD-10-CM

## 2019-02-05 DIAGNOSIS — Z9189 Other specified personal risk factors, not elsewhere classified: Secondary | ICD-10-CM | POA: Diagnosis not present

## 2019-02-05 DIAGNOSIS — Z6841 Body Mass Index (BMI) 40.0 and over, adult: Secondary | ICD-10-CM

## 2019-02-05 MED ORDER — VITAMIN D (ERGOCALCIFEROL) 1.25 MG (50000 UNIT) PO CAPS: 50000.0000 [IU] | ORAL_CAPSULE | ORAL | 0 refills | Status: DC

## 2019-02-08 NOTE — Progress Notes (Signed)
Office: [phone]  /  Fax: [phone]   HPI:   Chief Complaint: OBESITY Rebekah [name] is here to discuss her progress with her obesity treatment plan. She is on the Atkins diet and is following her eating plan approximately 50 % of the time. She states she is exercising 0 minutes 0 times per week. [name] has been off track with her eating plan. She has had a lot of family stressors, and is not following any plan. She denies emotional eating. She would like to go back to an Atkins.  Her weight is 247 lb (112 kg) today and has gained 11 lbs since her last visit. She has lost 20 lbs since starting treatment with [name].  Vitamin D Deficiency Chasitee has a diagnosis of vitamin D deficiency. She is stable on prescription Vit D, but level is not yet at goal. She denies nausea, vomiting or muscle weakness.  At risk for osteopenia and osteoporosis [name] is at higher risk of osteopenia and osteoporosis due to vitamin D deficiency.   Depression with Emotional Eating Behaviors [name] notes increased family stress and is feeling overwhelmed. [name] struggles with emotional eating and using food for comfort to the extent that it is negatively impacting her health. She often snacks when she is not hungry. [name] sometimes feels she is out of control and then feels guilty that she made poor food choices. She has been working on behavior modification techniques to help reduce her emotional eating and has been somewhat successful. She shows no sign of suicidal or homicidal ideations.  Depression screen [facility] 2/9 07/17/2017 01/01/2017 04/20/2016 04/05/2016 12/27/2015  Decreased Interest 0 2 0 0 0  Down, Depressed, Hopeless 0 1 0 0 0  PHQ - 2 Score 0 3 0 0 0  Altered sleeping - 0 - - -  Tired, decreased energy - 2 - - -  Change in appetite - 1 - - -  Feeling bad or failure about yourself  - 3 - - -  Trouble concentrating - 0 - - -  Moving slowly or fidgety/restless - 3 - - -  Suicidal thoughts - 0 - - -  PHQ-9 Score - 12 - - -   Difficult doing work/chores - Very difficult - - -    ASSESSMENT AND PLAN:  Vitamin D deficiency - Plan: Vitamin D, Ergocalciferol, (DRISDOL) 1.25 MG (50000 UT) CAPS capsule  Other depression - with emotional eating   At risk for osteoporosis  Class 3 severe obesity with serious comorbidity and body mass index (BMI) of 45.0 to 49.9 in adult, unspecified obesity type (HCC)  PLAN:  Vitamin D Deficiency [name] was informed that low vitamin D levels contributes to fatigue and are associated with obesity, breast, and colon cancer. [name] agrees to continue taking prescription Vit D 50,000 IU every week #4 and we will refill for 2 months. She will follow up for routine testing of vitamin D, at least 2-3 times per year. She was informed of the risk of over-replacement of vitamin D and agrees to not increase her dose unless she discusses this with [name] first. [name] agrees to follow up with our clinic in 5 to 6 weeks.  At risk for osteopenia and osteoporosis [name] was given extended (15 minutes) osteoporosis prevention counseling today. [name] is at risk for osteopenia and osteoporsis due to her vitamin D deficiency. She was encouraged to take her vitamin D and follow her higher calcium diet and increase strengthening exercise to help strengthen her bones and decrease  her risk of osteopenia and osteoporosis.  Depression with Emotional Eating Behaviors We discussed behavior modification techniques today to help [name] deal with her emotional eating and depression. [name] agrees to increase Lexapro to 20 mg PO q AM #30 and we will refill for 2 months. [name] agrees to follow up with our clinic in 5 to 6 weeks.  Obesity [name] is currently in the action stage of change. As such, her goal is to continue with weight loss efforts She has agreed to follow the Kenvir has been instructed to work up to a goal of 150 minutes of combined cardio and strengthening exercise per week for weight loss and overall health  benefits. We discussed the following Behavioral Modification Strategies today: increasing lean protein intake and decreasing simple carbohydrates    [name] has agreed to follow up with our clinic in 5 to 6 weeks. She was informed of the importance of frequent follow up visits to maximize her success with intensive lifestyle modifications for her multiple health conditions.  ALLERGIES: Allergies  Allergen Reactions  . Beef-Derived Products Anaphylaxis    After tick bite, cannot eat beef, pork or lamb  . Lambs Quarters Anaphylaxis    After tick bite cannot eat beef, pork or lamb  . Mucinex [Guaifenesin Er] Anaphylaxis  . Pork-Derived Products Anaphylaxis    After tick bite cannot eat beef, pork or lamb  . Darvon [Propoxyphene] Other (See Comments)    Hallucinations   . Ace Inhibitors Swelling and Cough    Pedal Edema  . Ppd [Tuberculin Purified Protein Derivative]     Always has positive testing to PPD, do not use  . Hydromet [Hydrocodone-Homatropine] Itching and Other (See Comments)    Severe stomach pain-face itches.   . Sulfonamide Derivatives Rash    MEDICATIONS: Current Outpatient Medications on File Prior to Visit  Medication Sig Dispense Refill  . albuterol (PROVENTIL HFA;VENTOLIN HFA) 108 (90 Base) MCG/ACT inhaler Inhale 2 puffs into the lungs every 6 (six) hours as needed for wheezing or shortness of breath. 1 Inhaler 0  . CALCIUM PO Take 500 mg by mouth daily.     Marland Kitchen desmopressin (DDAVP) 0.2 MG tablet Take 3 tablets (0.6 mg total) by mouth at bedtime. 30 tablet 11  . diltiazem (CARDIZEM) 30 MG tablet TAKE ONE TO TWO TABLETS BY MOUTH EVERY 6 HOURS AS NEEDED FOR  FAST  HEART  RATES 30 tablet 1  . diltiazem (CARTIA XT) 120 MG 24 hr capsule TAKE 1 CAPSULE BY MOUTH ONCE DAILY 90 capsule 3  . EPINEPHrine (EPIPEN) 0.3 mg/0.3 mL IJ SOAJ injection Inject 0.3 mLs (0.3 mg total) into the muscle once as needed (anaphylaxis). 1 Device 6  . escitalopram (LEXAPRO) 10 MG tablet Take 1  tablet (10 mg total) by mouth every morning. 30 tablet 0  . Fexofenadine HCl (ALLEGRA PO) Take by mouth daily.    . flecainide (TAMBOCOR) 100 MG tablet Take 3 tablets by mouth at onset of fast heart rate as needed. 30 tablet 1  . furosemide (LASIX) 20 MG tablet TAKE 1 TABLET BY MOUTH ONCE DAILY 90 tablet 3  . levothyroxine (SYNTHROID) 50 MCG tablet TAKE 1 TABLET BY MOUTH ONCE DAILY BEFORE BREAKFAST 90 tablet 3  . losartan (COZAAR) 50 MG tablet Take 1 tablet (50 mg total) by mouth 2 (two) times daily. 180 tablet 3  . Multiple Vitamins-Minerals (MULTIVITAMIN ADULT PO) Take 1 capsule by mouth 2 (two) times daily. Journey 3+3 Bariatric Multivitamin    .  OXYGEN Inhale 3 L/min into the lungs at bedtime. Continuously    . rivaroxaban (XARELTO) 20 MG TABS tablet TAKE 1 TABLET BY MOUTH ONCE DAILY WITH SUPPER 90 tablet 1  . traMADol (ULTRAM) 50 MG tablet Take 1 tablet (50 mg total) by mouth every 6 (six) hours as needed. 60 tablet 2   No current facility-administered medications on file prior to visit.     PAST MEDICAL HISTORY: Past Medical History:  Diagnosis Date  . Anxiety   . Arthritis   . Bilateral bunions   . Complication of anesthesia   . Difficulty sleeping    prior sleep study did not reveal sleep apnea per patient  . DJD (degenerative joint disease)   . Dyspnea   . Dysrhythmia    a-fib  . Environmental allergies   . Food allergy   . Gallbladder problem   . GERD (gastroesophageal reflux disease)   . Heart murmur   . Hypertension    no meds after weight loss  . Hypothyroidism   . Incontinence of urine    at nite   . Joint pain   . Left atrial dilatation   . Left foot pain   . Leg edema   . Obesity    s/p gastric sleeve 12/2013 (previously weighed close to 400 lbs)  . Osteoarthritis   . Palpitations   . Paroxysmal atrial fibrillation (HCC)   . Pneumonia    hx of   . PONV (postoperative nausea and vomiting)   . Status post bilateral knee replacements   . Supplemental  oxygen dependent    uses 2l/[place] at night, states HR goes low and O2 drops  . Tuberculosis    had 6 month of INH due to exposure   . Vaginal vault prolapse     PAST SURGICAL HISTORY: Past Surgical History:  Procedure Laterality Date  . APPENDECTOMY    . CHOLECYSTECTOMY    . EYE SURGERY     03/2014 lens implant left lens   . FOOT ARTHRODESIS Left 03/15/2016   Procedure: TALONAVICULAR AND SUBTALAR ARTHRODESIS;  Surgeon: Wylene Simmer, MD;  Location: [facility];  Service: Orthopedics;  Laterality: Left;  Marland Kitchen GASTROC RECESSION EXTREMITY Left 03/15/2016   Procedure: LEFT GASTROC RECESSION;  Surgeon: Wylene Simmer, MD;  Location: [facility];  Service: Orthopedics;  Laterality: Left;  . JOINT REPLACEMENT  1999   L TOTAL KNEE  . LAPAROSCOPIC GASTRIC SLEEVE RESECTION N/A 01/04/2014   Procedure: LAPAROSCOPIC GASTRIC SLEEVE RESECTION;  Surgeon: Greer Pickerel, MD;  Location: [facility];  Service: General;  Laterality: N/A;  . TONSILLECTOMY    . TOTAL KNEE ARTHROPLASTY Right 05/17/2015   Procedure: RIGHT TOTAL KNEE ARTHROPLASTY;  Surgeon: Paralee Cancel, MD;  Location: [facility];  Service: Orthopedics;  Laterality: Right;  . TRANSTHORACIC ECHOCARDIOGRAM  11/2012   EF 60-65%, mod LVH & mod conc hypertrophy, grade 1 diastolic dysfunction; LA mildly dilated; RV systolic pressure increased; PA peak pressure 53mmHg  . TUBAL LIGATION    . UPPER GI ENDOSCOPY  01/04/2014   Procedure: UPPER GI ENDOSCOPY;  Surgeon: Greer Pickerel, MD;  Location: [facility];  Service: General;;    SOCIAL HISTORY: Social History   Tobacco Use  . Smoking status: Never Smoker  . Smokeless tobacco: Never Used  Substance Use Topics  . Alcohol use: No  . Drug use: No    FAMILY HISTORY: Family History  Problem Relation Age of Onset  . Diabetes Father   . Hyperlipidemia Father   .  Hypertension Father   . Heart disease Father   . Sudden death Father   . Anxiety disorder Father   . Obesity Father   . Uterine  cancer Mother   . Cervical cancer Mother   . Breast cancer Mother   . Cancer Mother        cervical and brreast cancer  . Hypertension Mother   . Hyperlipidemia Mother   . Obesity Mother   . Diabetes Maternal Grandmother     ROS: Review of Systems  Constitutional: Negative for weight loss.  Gastrointestinal: Negative for nausea and vomiting.  Musculoskeletal:       Negative muscle weakness  Psychiatric/Behavioral: Positive for depression. Negative for suicidal ideas.    PHYSICAL EXAM: Blood pressure 120/76, pulse 65, temperature 98.2 F (36.8 C), temperature source Oral, height 5\' 1"  (1.549 m), weight 247 lb (112 kg), SpO2 95 %. Body mass index is 46.67 kg/m. Physical Exam Vitals signs reviewed.  Constitutional:      Appearance: Normal appearance. She is obese.  Cardiovascular:     Rate and Rhythm: Normal rate.     Pulses: Normal pulses.  Pulmonary:     Effort: Pulmonary effort is normal.     Breath sounds: Normal breath sounds.  Musculoskeletal: Normal range of motion.  Skin:    General: Skin is warm and dry.  Neurological:     Mental Status: She is alert and oriented to person, place, and time.  Psychiatric:        Mood and Affect: Mood normal.        Behavior: Behavior normal.     RECENT LABS AND TESTS: BMET    Component Value Date/Time   NA 141 12/18/2018 0855   K 4.0 12/18/2018 0855   CL 101 12/18/2018 0855   CO2 24 12/18/2018 0855   GLUCOSE 85 12/18/2018 0855   GLUCOSE 141 (H) 05/18/2015 0456   BUN 19 12/18/2018 0855   CREATININE 0.79 12/18/2018 0855   CALCIUM 10.0 12/18/2018 0855   GFRNONAA 79 12/18/2018 0855   GFRAA 91 12/18/2018 0855   Lab Results  Component Value Date   HGBA1C 5.4 12/18/2018   HGBA1C 5.5 07/29/2018   HGBA1C 5.5 03/06/2018   HGBA1C 5.7 (H) 12/02/2017   HGBA1C 5.5 05/02/2017   Lab Results  Component Value Date   INSULIN 7.1 12/18/2018   INSULIN 9.8 07/29/2018   INSULIN 6.5 03/06/2018   INSULIN 9.2 12/02/2017    INSULIN 7.9 05/02/2017   CBC    Component Value Date/Time   WBC 6.5 12/18/2018 0855   WBC 8.0 05/18/2015 0456   RBC 4.75 12/18/2018 0855   RBC 3.29 (L) 05/18/2015 0456   HGB 13.4 12/18/2018 0855   HCT 41.5 12/18/2018 0855   PLT 117 (L) 05/18/2015 0456   MCV 87 12/18/2018 0855   MCH 28.2 12/18/2018 0855   MCH 29.2 05/18/2015 0456   MCHC 32.3 12/18/2018 0855   MCHC 32.3 05/18/2015 0456   RDW 12.9 12/18/2018 0855   LYMPHSABS 1.4 12/18/2018 0855   MONOABS 0.8 03/26/2015 0316   EOSABS 0.2 12/18/2018 0855   BASOSABS 0.1 12/18/2018 0855   Iron/TIBC/Ferritin/ %Sat No results found for: IRON, TIBC, FERRITIN, IRONPCTSAT Lipid Panel     Component Value Date/Time   CHOL 177 12/18/2018 0855   TRIG 68 12/18/2018 0855   HDL 79 12/18/2018 0855   CHOLHDL 2.8 03/06/2018 1319   CHOLHDL 4.2 07/02/2013 1301   VLDL 43 (H) 07/02/2013 1301   LDLCALC 85 12/18/2018 0855  Hepatic Function Panel     Component Value Date/Time   PROT 7.3 12/18/2018 0855   ALBUMIN 4.4 12/18/2018 0855   AST 20 12/18/2018 0855   ALT 17 12/18/2018 0855   ALKPHOS 75 12/18/2018 0855   BILITOT 0.5 12/18/2018 0855      Component Value Date/Time   TSH 1.660 12/18/2018 0855   TSH 3.740 07/29/2018 0906   TSH 3.580 03/06/2018 1319      OBESITY BEHAVIORAL INTERVENTION VISIT  Today's visit was # 29   Starting weight: 267 lbs Starting date: 01/01/17 Today's weight : 247 lbs Today's date: 02/05/2019 Total lbs lost to date: 42    ASK: We discussed the diagnosis of obesity with [name] today and Rayza agreed to give [name] permission to discuss obesity behavioral modification therapy today.  ASSESS: Veniece has the diagnosis of obesity and her BMI today is 46.69 Arnel is in the action stage of change   ADVISE: Abella was educated on the multiple health risks of obesity as well as the benefit of weight loss to improve her health. She was advised of the need for long term treatment and the importance of lifestyle  modifications to improve her current health and to decrease her risk of future health problems.  AGREE: Multiple dietary modification options and treatment options were discussed and  Nykayla agreed to follow the recommendations documented in the above note.  ARRANGE: Norman was educated on the importance of frequent visits to treat obesity as outlined per CMS and USPSTF guidelines and agreed to schedule her next follow up appointment today.  I, Trixie Dredge, am acting as transcriptionist for Dennard Nip, MD  I have reviewed the above documentation for accuracy and completeness, and I agree with the above. -Dennard Nip, MD

## 2019-02-09 ENCOUNTER — Other Ambulatory Visit (INDEPENDENT_AMBULATORY_CARE_PROVIDER_SITE_OTHER): Payer: Self-pay

## 2019-02-09 ENCOUNTER — Telehealth (INDEPENDENT_AMBULATORY_CARE_PROVIDER_SITE_OTHER): Payer: Self-pay | Admitting: Family Medicine

## 2019-02-09 MED ORDER — ESCITALOPRAM OXALATE 10 MG PO TABS: 10.0000 mg | ORAL_TABLET | ORAL | 0 refills | Status: DC

## 2019-02-09 MED ORDER — ESCITALOPRAM OXALATE 20 MG PO TABS: 20.0000 mg | ORAL_TABLET | Freq: Every day | ORAL | 0 refills | Status: DC

## 2019-02-09 NOTE — Telephone Encounter (Signed)
Patient states that her pharmacy, [facility], called her and told her that we sent in a refill for her Lexapro but at 10mg .  Per her last visit with Dr. Leafy Ro the doctor wanted it changed to 20mg .  Patient needs a 2 month supply.  She is leaving town Thursday, 10/29.  Please advise patient if Dr. Leafy Ro is going to leave it at 10mg .  Thank you

## 2019-02-09 NOTE — Telephone Encounter (Signed)
Lexapro 20mg  sent to pharmacy for 2 month supply, LM for pt to notifiy.  Niranjan Rufener

## 2019-03-02 ENCOUNTER — Ambulatory Visit (INDEPENDENT_AMBULATORY_CARE_PROVIDER_SITE_OTHER): Payer: BC Managed Care – PPO | Admitting: Family Medicine

## 2019-03-07 ENCOUNTER — Encounter (INDEPENDENT_AMBULATORY_CARE_PROVIDER_SITE_OTHER): Payer: Self-pay | Admitting: Family Medicine

## 2019-03-09 NOTE — Telephone Encounter (Signed)
fyi

## 2019-03-09 NOTE — Telephone Encounter (Signed)
Please advise 

## 2019-03-17 ENCOUNTER — Encounter (INDEPENDENT_AMBULATORY_CARE_PROVIDER_SITE_OTHER): Payer: Self-pay | Admitting: Family Medicine

## 2019-03-17 ENCOUNTER — Other Ambulatory Visit: Payer: Self-pay

## 2019-03-17 ENCOUNTER — Ambulatory Visit (INDEPENDENT_AMBULATORY_CARE_PROVIDER_SITE_OTHER): Payer: BC Managed Care – PPO | Admitting: Family Medicine

## 2019-03-17 VITALS — BP 159/89 | HR 80 | Temp 98.5°F | Ht 61.0 in | Wt 251.0 lb

## 2019-03-17 DIAGNOSIS — Z6841 Body Mass Index (BMI) 40.0 and over, adult: Secondary | ICD-10-CM

## 2019-03-17 DIAGNOSIS — Z9189 Other specified personal risk factors, not elsewhere classified: Secondary | ICD-10-CM

## 2019-03-17 DIAGNOSIS — F3289 Other specified depressive episodes: Secondary | ICD-10-CM | POA: Diagnosis not present

## 2019-03-17 DIAGNOSIS — E559 Vitamin D deficiency, unspecified: Secondary | ICD-10-CM | POA: Diagnosis not present

## 2019-03-17 MED ORDER — VITAMIN D (ERGOCALCIFEROL) 1.25 MG (50000 UNIT) PO CAPS: 50000.0000 [IU] | ORAL_CAPSULE | ORAL | 1 refills | Status: DC

## 2019-03-17 MED ORDER — ESCITALOPRAM OXALATE 20 MG PO TABS: 20.0000 mg | ORAL_TABLET | Freq: Every day | ORAL | 1 refills | Status: DC

## 2019-03-17 MED ORDER — LOSARTAN POTASSIUM 50 MG PO TABS: 50.0000 mg | ORAL_TABLET | Freq: Two times a day (BID) | ORAL | 3 refills | Status: DC

## 2019-03-17 NOTE — Progress Notes (Signed)
Office: [phone]  /  Fax: [phone]   HPI:   Chief Complaint: OBESITY Niamiah is here to discuss her progress with her obesity treatment plan. She is on the Atkins Probation officer) eating plan and Weight Watchers and is following her eating plan approximately 80% of the time. She states she is walking more when out-of-town. Waylon traveled and went off strict Atkins plan and she gained 20+ lbs. She joined the new [employer] and has lost most of the weight. She is naturally frustrated with this. Her weight is 251 lb (113.9 kg) today and has had a weight gain of 4 lbs since her last visit. She has lost 16 lbs since starting treatment with [name].  Depression, Other  Adeena is struggling with emotional eating and using food for comfort to the extent that it is negatively impacting her health. She often snacks when she is not hungry. Lindaann sometimes feels she is out of control and then feels guilty that she made poor food choices. She has been working on behavior modification techniques to help reduce her emotional eating and has been somewhat successful. Yaeko's mood has been down due to holiday and travel weight gain. She is otherwise stable on her medications. She shows no sign of suicidal or homicidal ideations.  Depression screen [facility] 2/9 07/17/2017 01/01/2017 04/20/2016 04/05/2016 12/27/2015  Decreased Interest 0 2 0 0 0  Down, Depressed, Hopeless 0 1 0 0 0  PHQ - 2 Score 0 3 0 0 0  Altered sleeping - 0 - - -  Tired, decreased energy - 2 - - -  Change in appetite - 1 - - -  Feeling bad or failure about yourself  - 3 - - -  Trouble concentrating - 0 - - -  Moving slowly or fidgety/restless - 3 - - -  Suicidal thoughts - 0 - - -  PHQ-9 Score - 12 - - -  Difficult doing work/chores - Very difficult - - -   Vitamin D deficiency Marleigh has a diagnosis of Vitamin D deficiency. She is currently taking prescription Vit D and denies nausea, vomiting or muscle weakness.  At risk for cardiovascular disease  Kjirsten is at a higher than average risk for cardiovascular disease due to obesity. She currently denies any chest pain.  ASSESSMENT AND PLAN:  Vitamin D deficiency - Plan: Vitamin D, Ergocalciferol, (DRISDOL) 1.25 MG (50000 UT) CAPS capsule  Other depression, emotional eating - Plan: escitalopram (LEXAPRO) 20 MG tablet  At risk for heart disease  Class 3 severe obesity with serious comorbidity and body mass index (BMI) of 45.0 to 49.9 in adult, unspecified obesity type ([place])  PLAN:  Emotional Eating Behaviors (other depression) We discussed behavior modification techniques today to help Jahnia deal with her emotional eating behaviors. Camya was given a refill on her Lexapro 20 mg #30 with 1 refill and agrees to follow-up with our clinic in 6 weeks.  Vitamin D Deficiency Dedrea was informed that low Vitamin D levels contributes to fatigue and are associated with obesity, breast, and colon cancer. She agrees to continue to take prescription Vit D @ 50,000 IU every week #4 with 1 refill and will follow-up for routine testing of Vitamin D, at least 2-3 times per year. She was informed of the risk of over-replacement of Vitamin D and agrees to not increase her dose unless she discusses this with [name] first. Jhayla agrees to follow-up with our clinic in 6 weeks.  Cardiovascular risk counseling Shyasia was given  extended (15 minutes) coronary artery disease prevention counseling today. She is 63 y.o. female and has risk factors for heart disease including obesity. We discussed intensive lifestyle modifications today with an emphasis on specific weight loss instructions and strategies. Pt was also informed of the importance of increasing exercise and decreasing saturated fats to help prevent heart disease.  Obesity Manya is currently in the action stage of change. As such, her goal is to continue with weight loss efforts. She has agreed to follow the Weight Watchers "blue" plan. Citalli has been instructed to work  up to a goal of 150 minutes of combined cardio and strengthening exercise per week for weight loss and overall health benefits. We discussed the following Behavioral Modification Strategies today: work on meal planning and easy cooking plans, and holiday eating strategies.  Jonnie has agreed to follow-up with our clinic in 6 weeks. She was informed of the importance of frequent follow-up visits to maximize her success with intensive lifestyle modifications for her multiple health conditions.  ALLERGIES: Allergies  Allergen Reactions  . Beef-Derived Products Anaphylaxis    After tick bite, cannot eat beef, pork or lamb  . Lambs Quarters Anaphylaxis    After tick bite cannot eat beef, pork or lamb  . Mucinex [Guaifenesin Er] Anaphylaxis  . Pork-Derived Products Anaphylaxis    After tick bite cannot eat beef, pork or lamb  . Darvon [Propoxyphene] Other (See Comments)    Hallucinations   . Ace Inhibitors Swelling and Cough    Pedal Edema  . Ppd [Tuberculin Purified Protein Derivative]     Always has positive testing to PPD, do not use  . Hydromet [Hydrocodone-Homatropine] Itching and Other (See Comments)    Severe stomach pain-face itches.   . Sulfonamide Derivatives Rash    MEDICATIONS: Current Outpatient Medications on File Prior to Visit  Medication Sig Dispense Refill  . albuterol (PROVENTIL HFA;VENTOLIN HFA) 108 (90 Base) MCG/ACT inhaler Inhale 2 puffs into the lungs every 6 (six) hours as needed for wheezing or shortness of breath. 1 Inhaler 0  . CALCIUM PO Take 500 mg by mouth daily.     Marland Kitchen desmopressin (DDAVP) 0.2 MG tablet Take 3 tablets (0.6 mg total) by mouth at bedtime. 30 tablet 11  . diltiazem (CARDIZEM) 30 MG tablet TAKE ONE TO TWO TABLETS BY MOUTH EVERY 6 HOURS AS NEEDED FOR  FAST  HEART  RATES 30 tablet 1  . diltiazem (CARTIA XT) 120 MG 24 hr capsule TAKE 1 CAPSULE BY MOUTH ONCE DAILY 90 capsule 3  . EPINEPHrine (EPIPEN) 0.3 mg/0.3 mL IJ SOAJ injection Inject 0.3 mLs (0.3  mg total) into the muscle once as needed (anaphylaxis). 1 Device 6  . escitalopram (LEXAPRO) 20 MG tablet Take 1 tablet (20 mg total) by mouth daily. 60 tablet 0  . Fexofenadine HCl (ALLEGRA PO) Take by mouth daily.    . flecainide (TAMBOCOR) 100 MG tablet Take 3 tablets by mouth at onset of fast heart rate as needed. 30 tablet 1  . furosemide (LASIX) 20 MG tablet TAKE 1 TABLET BY MOUTH ONCE DAILY 90 tablet 3  . levothyroxine (SYNTHROID) 50 MCG tablet TAKE 1 TABLET BY MOUTH ONCE DAILY BEFORE BREAKFAST 90 tablet 3  . Multiple Vitamins-Minerals (MULTIVITAMIN ADULT PO) Take 1 capsule by mouth 2 (two) times daily. Journey 3+3 Bariatric Multivitamin    . OXYGEN Inhale 3 L/min into the lungs at bedtime. Continuously    . rivaroxaban (XARELTO) 20 MG TABS tablet TAKE 1 TABLET BY MOUTH  ONCE DAILY WITH SUPPER 90 tablet 1  . traMADol (ULTRAM) 50 MG tablet Take 1 tablet (50 mg total) by mouth every 6 (six) hours as needed. 60 tablet 2  . Vitamin D, Ergocalciferol, (DRISDOL) 1.25 MG (50000 UT) CAPS capsule Take 1 capsule (50,000 Units total) by mouth every 7 (seven) days. 8 capsule 0   No current facility-administered medications on file prior to visit.     PAST MEDICAL HISTORY: Past Medical History:  Diagnosis Date  . Anxiety   . Arthritis   . Bilateral bunions   . Complication of anesthesia   . Difficulty sleeping    prior sleep study did not reveal sleep apnea per patient  . DJD (degenerative joint disease)   . Dyspnea   . Dysrhythmia    a-fib  . Environmental allergies   . Food allergy   . Gallbladder problem   . GERD (gastroesophageal reflux disease)   . Heart murmur   . Hypertension    no meds after weight loss  . Hypothyroidism   . Incontinence of urine    at nite   . Joint pain   . Left atrial dilatation   . Left foot pain   . Leg edema   . Obesity    s/p gastric sleeve 12/2013 (previously weighed close to 400 lbs)  . Osteoarthritis   . Palpitations   . Paroxysmal atrial  fibrillation (HCC)   . Pneumonia    hx of   . PONV (postoperative nausea and vomiting)   . Status post bilateral knee replacements   . Supplemental oxygen dependent    uses 2l/ at night, states HR goes low and O2 drops  . Tuberculosis    had 6 month of INH due to exposure   . Vaginal vault prolapse     PAST SURGICAL HISTORY: Past Surgical History:  Procedure Laterality Date  . APPENDECTOMY    . CHOLECYSTECTOMY    . EYE SURGERY     03/2014 lens implant left lens   . FOOT ARTHRODESIS Left 03/15/2016   Procedure: TALONAVICULAR AND SUBTALAR ARTHRODESIS;  Surgeon: Wylene Simmer, MD;  Location: [facility];  Service: Orthopedics;  Laterality: Left;  Marland Kitchen GASTROC RECESSION EXTREMITY Left 03/15/2016   Procedure: LEFT GASTROC RECESSION;  Surgeon: Wylene Simmer, MD;  Location: [facility];  Service: Orthopedics;  Laterality: Left;  . JOINT REPLACEMENT  1999   L TOTAL KNEE  . LAPAROSCOPIC GASTRIC SLEEVE RESECTION N/A 01/04/2014   Procedure: LAPAROSCOPIC GASTRIC SLEEVE RESECTION;  Surgeon: Greer Pickerel, MD;  Location: [facility];  Service: General;  Laterality: N/A;  . TONSILLECTOMY    . TOTAL KNEE ARTHROPLASTY Right 05/17/2015   Procedure: RIGHT TOTAL KNEE ARTHROPLASTY;  Surgeon: Paralee Cancel, MD;  Location: [facility];  Service: Orthopedics;  Laterality: Right;  . TRANSTHORACIC ECHOCARDIOGRAM  11/2012   EF 60-65%, mod LVH & mod conc hypertrophy, grade 1 diastolic dysfunction; LA mildly dilated; RV systolic pressure increased; PA peak pressure 9mmHg  . TUBAL LIGATION    . UPPER GI ENDOSCOPY  01/04/2014   Procedure: UPPER GI ENDOSCOPY;  Surgeon: Greer Pickerel, MD;  Location: [facility];  Service: General;;    SOCIAL HISTORY: Social History   Tobacco Use  . Smoking status: Never Smoker  . Smokeless tobacco: Never Used  Substance Use Topics  . Alcohol use: No  . Drug use: No    FAMILY HISTORY: Family History  Problem Relation Age of Onset  . Diabetes Father   .  Hyperlipidemia Father   . Hypertension Father   . Heart disease Father   . Sudden death Father   . Anxiety disorder Father   . Obesity Father   . Uterine cancer Mother   . Cervical cancer Mother   . Breast cancer Mother   . Cancer Mother        cervical and brreast cancer  . Hypertension Mother   . Hyperlipidemia Mother   . Obesity Mother   . Diabetes Maternal Grandmother    ROS: Review of Systems  Gastrointestinal: Negative for nausea and vomiting.  Musculoskeletal:       Negative for muscle weakness.  Psychiatric/Behavioral: Positive for depression. Negative for suicidal ideas.       Negative for homicidal ideas.   PHYSICAL EXAM: Blood pressure (!) 159/89, pulse 80, temperature 98.5 F (36.9 C), temperature source Oral, height 5\' 1"  (1.549 m), weight 251 lb (113.9 kg), SpO2 97 %. Body mass index is 47.43 kg/m. Physical Exam Vitals signs reviewed.  Constitutional:      Appearance: Normal appearance. She is obese.  Cardiovascular:     Rate and Rhythm: Normal rate.     Pulses: Normal pulses.  Pulmonary:     Effort: Pulmonary effort is normal.     Breath sounds: Normal breath sounds.  Musculoskeletal: Normal range of motion.  Skin:    General: Skin is warm and dry.  Neurological:     Mental Status: She is alert and oriented to person, place, and time.  Psychiatric:        Behavior: Behavior normal.   RECENT LABS AND TESTS: BMET    Component Value Date/Time   NA 141 12/18/2018 0855   K 4.0 12/18/2018 0855   CL 101 12/18/2018 0855   CO2 24 12/18/2018 0855   GLUCOSE 85 12/18/2018 0855   GLUCOSE 141 (H) 05/18/2015 0456   BUN 19 12/18/2018 0855   CREATININE 0.79 12/18/2018 0855   CALCIUM 10.0 12/18/2018 0855   GFRNONAA 79 12/18/2018 0855   GFRAA 91 12/18/2018 0855   Lab Results  Component Value Date   HGBA1C 5.4 12/18/2018   HGBA1C 5.5 07/29/2018   HGBA1C 5.5 03/06/2018   HGBA1C 5.7 (H) 12/02/2017   HGBA1C 5.5 05/02/2017   Lab Results  Component Value  Date   INSULIN 7.1 12/18/2018   INSULIN 9.8 07/29/2018   INSULIN 6.5 03/06/2018   INSULIN 9.2 12/02/2017   INSULIN 7.9 05/02/2017   CBC    Component Value Date/Time   WBC 6.5 12/18/2018 0855   WBC 8.0 05/18/2015 0456   RBC 4.75 12/18/2018 0855   RBC 3.29 (L) 05/18/2015 0456   HGB 13.4 12/18/2018 0855   HCT 41.5 12/18/2018 0855   PLT 117 (L) 05/18/2015 0456   MCV 87 12/18/2018 0855   MCH 28.2 12/18/2018 0855   MCH 29.2 05/18/2015 0456   MCHC 32.3 12/18/2018 0855   MCHC 32.3 05/18/2015 0456   RDW 12.9 12/18/2018 0855   LYMPHSABS 1.4 12/18/2018 0855   MONOABS 0.8 03/26/2015 0316   EOSABS 0.2 12/18/2018 0855   BASOSABS 0.1 12/18/2018 0855   Iron/TIBC/Ferritin/ %Sat No results found for: IRON, TIBC, FERRITIN, IRONPCTSAT Lipid Panel     Component Value Date/Time   CHOL 177 12/18/2018 0855   TRIG 68 12/18/2018 0855   HDL 79 12/18/2018 0855   CHOLHDL 2.8 03/06/2018 1319   CHOLHDL 4.2 07/02/2013 1301   VLDL 43 (H) 07/02/2013 1301   LDLCALC 85 12/18/2018 0855   Hepatic Function Panel  Component Value Date/Time   PROT 7.3 12/18/2018 0855   ALBUMIN 4.4 12/18/2018 0855   AST 20 12/18/2018 0855   ALT 17 12/18/2018 0855   ALKPHOS 75 12/18/2018 0855   BILITOT 0.5 12/18/2018 0855      Component Value Date/Time   TSH 1.660 12/18/2018 0855   TSH 3.740 07/29/2018 0906   TSH 3.580 03/06/2018 1319   Results for Brecht, ARZELIA HARVIN (MRN [id number]) as of 03/17/2019 11:52  Ref. Range 12/18/2018 08:55  Vitamin D, 25-Hydroxy Latest Ref Range: 30.0 - 100.0 ng/mL 41.3   OBESITY BEHAVIORAL INTERVENTION VISIT  Today's visit was #29    Starting weight: 267 lbs Starting date: 01/01/2017 Today's weight: 251 lbs  Today's date: 03/17/2019 Total lbs lost to date: 16     03/17/2019  Height 5\' 1"  (1.549 m)  Weight 251 lb (113.9 kg)  BMI (Calculated) 47.45  BLOOD PRESSURE - SYSTOLIC [id number]  BLOOD PRESSURE - DIASTOLIC 89   Body Fat % [id number] %   ASK: We discussed the diagnosis of obesity  with [name] today and Averyanna agreed to give [name] permission to discuss obesity behavioral modification therapy today.  ASSESS: Romani has the diagnosis of obesity and her BMI today is 47.5. Lolah is in the action stage of change.   ADVISE: Kanai was educated on the multiple health risks of obesity as well as the benefit of weight loss to improve her health. She was advised of the need for long term treatment and the importance of lifestyle modifications to improve her current health and to decrease her risk of future health problems.  AGREE: Multiple dietary modification options and treatment options were discussed and  Angely agreed to follow the recommendations documented in the above note.  ARRANGE: Joselyn was educated on the importance of frequent visits to treat obesity as outlined per CMS and USPSTF guidelines and agreed to schedule her next follow up appointment today.  I, Michaelene Song, am acting as Location manager for Dennard Nip, MD  I have reviewed the above documentation for accuracy and completeness, and I agree with the above. -Dennard Nip, MD

## 2019-04-02 ENCOUNTER — Other Ambulatory Visit: Payer: Self-pay | Admitting: Internal Medicine

## 2019-04-02 NOTE — Telephone Encounter (Signed)
Rx has been sent to the pharmacy electronically. ° °

## 2019-04-02 NOTE — Telephone Encounter (Signed)
Please review for refill, Thanks !  

## 2019-04-07 ENCOUNTER — Encounter: Payer: Self-pay | Admitting: Physician Assistant

## 2019-04-07 ENCOUNTER — Telehealth: Payer: Self-pay | Admitting: Physician Assistant

## 2019-04-07 ENCOUNTER — Other Ambulatory Visit: Payer: Self-pay | Admitting: Physician Assistant

## 2019-04-07 MED ORDER — CIPROFLOXACIN HCL 500 MG PO TABS: 500.0000 mg | ORAL_TABLET | Freq: Two times a day (BID) | ORAL | 0 refills | Status: DC

## 2019-04-07 NOTE — Telephone Encounter (Signed)
Message just sent

## 2019-04-07 NOTE — Telephone Encounter (Signed)
Patient says she has put a message in My Chart for Glenard Haring to review. Says it is urgent. Believes she has another bladder infection, which Angel treated patient for back in the fall. Wants to know if Glenard Haring can call the medication in for her again. Says its not the initial medicine that was sent in, but the medicine that she sent after her urine results came back.

## 2019-04-20 ENCOUNTER — Encounter: Payer: Self-pay | Admitting: Physician Assistant

## 2019-04-20 ENCOUNTER — Other Ambulatory Visit: Payer: Self-pay | Admitting: Physician Assistant

## 2019-04-20 MED ORDER — CIPROFLOXACIN HCL 500 MG PO TABS: 500.0000 mg | ORAL_TABLET | Freq: Two times a day (BID) | ORAL | 0 refills | Status: DC

## 2019-04-20 MED ORDER — TRAMADOL HCL 50 MG PO TABS: 50.0000 mg | ORAL_TABLET | Freq: Four times a day (QID) | ORAL | 2 refills | Status: DC | PRN

## 2019-04-27 ENCOUNTER — Ambulatory Visit (INDEPENDENT_AMBULATORY_CARE_PROVIDER_SITE_OTHER): Payer: BC Managed Care – PPO | Admitting: Family Medicine

## 2019-04-27 ENCOUNTER — Encounter: Payer: Self-pay | Admitting: Physician Assistant

## 2019-04-28 ENCOUNTER — Other Ambulatory Visit: Payer: Self-pay | Admitting: Physician Assistant

## 2019-04-28 MED ORDER — AMOXICILLIN-POT CLAVULANATE 875-125 MG PO TABS: 1.0000 | ORAL_TABLET | Freq: Two times a day (BID) | ORAL | 0 refills | Status: DC

## 2019-05-06 ENCOUNTER — Other Ambulatory Visit: Payer: Self-pay

## 2019-05-06 ENCOUNTER — Ambulatory Visit (INDEPENDENT_AMBULATORY_CARE_PROVIDER_SITE_OTHER): Payer: BC Managed Care – PPO | Admitting: Family Medicine

## 2019-05-06 ENCOUNTER — Encounter (INDEPENDENT_AMBULATORY_CARE_PROVIDER_SITE_OTHER): Payer: Self-pay | Admitting: Family Medicine

## 2019-05-06 VITALS — BP 131/82 | HR 83 | Temp 98.2°F | Ht 61.0 in | Wt 257.0 lb

## 2019-05-06 DIAGNOSIS — F3289 Other specified depressive episodes: Secondary | ICD-10-CM

## 2019-05-06 DIAGNOSIS — E559 Vitamin D deficiency, unspecified: Secondary | ICD-10-CM | POA: Diagnosis not present

## 2019-05-06 DIAGNOSIS — Z6841 Body Mass Index (BMI) 40.0 and over, adult: Secondary | ICD-10-CM

## 2019-05-06 DIAGNOSIS — Z9189 Other specified personal risk factors, not elsewhere classified: Secondary | ICD-10-CM

## 2019-05-06 MED ORDER — ESCITALOPRAM OXALATE 20 MG PO TABS: 20.0000 mg | ORAL_TABLET | Freq: Every day | ORAL | 0 refills | Status: DC

## 2019-05-06 MED ORDER — VITAMIN D (ERGOCALCIFEROL) 1.25 MG (50000 UNIT) PO CAPS: 50000.0000 [IU] | ORAL_CAPSULE | ORAL | 0 refills | Status: DC

## 2019-05-07 LAB — COMPREHENSIVE METABOLIC PANEL
ALT: 13 IU/L (ref 0–32)
AST: 20 IU/L (ref 0–40)
Albumin/Globulin Ratio: 1.5 (ref 1.2–2.2)
Albumin: 4.6 g/dL (ref 3.8–4.8)
Alkaline Phosphatase: 77 IU/L (ref 39–117)
BUN/Creatinine Ratio: 20 (ref 12–28)
BUN: 20 mg/dL (ref 8–27)
Bilirubin Total: 0.6 mg/dL (ref 0.0–1.2)
CO2: 29 mmol/L (ref 20–29)
Calcium: 10.1 mg/dL (ref 8.7–10.3)
Chloride: 99 mmol/L (ref 96–106)
Creatinine, Ser: 1.01 mg/dL — ABNORMAL HIGH (ref 0.57–1.00)
GFR calc Af Amer: 68 mL/min/{1.73_m2} (ref >59)
GFR calc non Af Amer: 59 mL/min/{1.73_m2} — ABNORMAL LOW (ref >59)
Globulin, Total: 3 g/dL (ref 1.5–4.5)
Glucose: 87 mg/dL (ref 65–99)
Potassium: 4.9 mmol/L (ref 3.5–5.2)
Sodium: 142 mmol/L (ref 134–144)
Total Protein: 7.6 g/dL (ref 6.0–8.5)

## 2019-05-07 LAB — VITAMIN D 25 HYDROXY (VIT D DEFICIENCY, FRACTURES): Vit D, 25-Hydroxy: 35.2 ng/mL (ref 30.0–100.0)

## 2019-05-07 NOTE — Progress Notes (Signed)
Chief Complaint:   OBESITY Rebekah [name] is here to discuss her progress with her obesity treatment plan along with follow-up of her obesity related diagnoses. Rebekah [name] is on the [employer] plan and states she is following her eating plan approximately 100% of the time. Rebekah [name] states she is walking for 15-45 minutes 7 times per week.  Today's visit was #: 92 Starting weight: 267 lbs Starting date: 01/01/17 Today's weight: 257 lbs Today's date: 05/06/2019 Total lbs lost to date: 10 Total lbs lost since last in-office visit: 0  Interim History: Rebekah [name] decided to change to the low carbohydrate plan and didn't do well with weight loss. She is thinking about getting back to the original Atkins plan. She also asks to change to every 3 month visits, as she does not need the close follow ups.  Subjective:   1. Vitamin D deficiency Rebekah [name] is stable on Vit D, and she is due for labs.  2. Other depression, emotional eating Rebekah [name] is stable on Lexapro, and she is doing well controlling emotional eating.  3. At risk for hypoglycemia Rebekah [name] is at increased risk for hypoglycemia with a very low carbohydrate plan, diagnosis of diabetes, and/or insulin use. Rebekah [name] is not currently taking insulin.   Assessment/Plan:   1. Vitamin D deficiency Low Vitamin D level contributes to fatigue and are associated with obesity, breast, and colon cancer. We will refill prescription Vit D with a 90 days supply. Kentra will follow-up for routine testing of Vitamin D, at least 2-3 times per year to avoid over-replacement. We will continue to monitor.  - VITAMIN D 25 Hydroxy (Vit-D Deficiency, Fractures) - Comprehensive metabolic panel - Vitamin D, Ergocalciferol, (DRISDOL) 1.25 MG (50000 UNIT) CAPS capsule; Take 1 capsule (50,000 Units total) by mouth every 7 (seven) days.  Dispense: 12 capsule; Refill: 0  2. Other depression, emotional eating Behavior modification techniques were discussed today to help Tramanh deal with her  emotional/non-hunger eating behaviors. We will refill Lexapro with a 90 day supply. Orders and follow up as documented in patient record.   - escitalopram (LEXAPRO) 20 MG tablet; Take 1 tablet (20 mg total) by mouth daily.  Dispense: 90 tablet; Refill: 0  3. At risk for hypoglycemia Rebekah [name] was given approximately 15 minutes of counseling today regarding prevention of hypoglycemia. She was advised of symptoms of hypoglycemia. Rebekah [name] was instructed to eat regular meals.   4. Class 3 severe obesity with serious comorbidity and body mass index (BMI) of 45.0 to 49.9 in adult, unspecified obesity type Rebekah [name]) Denee is currently in the action stage of change. As such, her goal is to continue with weight loss efforts. She has agreed to the [employer].   Behavioral modification strategies: increasing lean protein intake and meal planning and cooking strategies.  Harlequin has agreed to follow-up with our clinic in 3 months. She was informed of the importance of frequent follow-up visits to maximize her success with intensive lifestyle modifications for her multiple health conditions.   Rebekah [name] was informed we would discuss her lab results at her next visit unless there is a critical issue that needs to be addressed sooner. Rebekah [name] agreed to keep her next visit at the agreed upon time to discuss these results.  Objective:   Blood pressure 131/82, pulse 83, temperature 98.2 F (36.8 C), temperature source Oral, height 5\' 1"  (1.549 m), weight 257 lb (116.6 kg), SpO2 97 %. Body mass index is 48.56 kg/m.  General: Cooperative, alert, well developed, in no  acute distress. HEENT: Conjunctivae and lids unremarkable. Cardiovascular: Regular rhythm.  Lungs: Normal work of breathing. Neurologic: No focal deficits.   Lab Results  Component Value Date   CREATININE 0.79 12/18/2018   BUN 19 12/18/2018   NA 141 12/18/2018   K 4.0 12/18/2018   CL 101 12/18/2018   CO2 24 12/18/2018   Lab Results  Component Value Date    ALT 17 12/18/2018   AST 20 12/18/2018   ALKPHOS 75 12/18/2018   BILITOT 0.5 12/18/2018   Lab Results  Component Value Date   HGBA1C 5.4 12/18/2018   HGBA1C 5.5 07/29/2018   HGBA1C 5.5 03/06/2018   HGBA1C 5.7 (H) 12/02/2017   HGBA1C 5.5 05/02/2017   Lab Results  Component Value Date   INSULIN 7.1 12/18/2018   INSULIN 9.8 07/29/2018   INSULIN 6.5 03/06/2018   INSULIN 9.2 12/02/2017   INSULIN 7.9 05/02/2017   Lab Results  Component Value Date   TSH 1.660 12/18/2018   Lab Results  Component Value Date   CHOL 177 12/18/2018   HDL 79 12/18/2018   LDLCALC 85 12/18/2018   TRIG 68 12/18/2018   CHOLHDL 2.8 03/06/2018   Lab Results  Component Value Date   WBC 6.5 12/18/2018   HGB 13.4 12/18/2018   HCT 41.5 12/18/2018   MCV 87 12/18/2018   PLT 117 (L) 05/18/2015   No results found for: IRON, TIBC, FERRITIN  Attestation Statements:   Reviewed by clinician on day of visit: allergies, medications, problem list, medical history, surgical history, family history, social history, and previous encounter notes.   I, Trixie Dredge, am acting as transcriptionist for Dennard Nip, MD.  I have reviewed the above documentation for accuracy and completeness, and I agree with the above. -  Dennard Nip, MD

## 2019-05-18 ENCOUNTER — Other Ambulatory Visit: Payer: Self-pay

## 2019-05-19 ENCOUNTER — Encounter: Payer: Self-pay | Admitting: Physician Assistant

## 2019-05-19 ENCOUNTER — Ambulatory Visit (INDEPENDENT_AMBULATORY_CARE_PROVIDER_SITE_OTHER): Payer: BC Managed Care – PPO | Admitting: Physician Assistant

## 2019-05-19 ENCOUNTER — Telehealth: Payer: Self-pay | Admitting: Physician Assistant

## 2019-05-19 ENCOUNTER — Other Ambulatory Visit: Payer: Self-pay | Admitting: Physician Assistant

## 2019-05-19 VITALS — BP 140/87 | HR 77 | Temp 99.6°F | Ht 61.0 in | Wt 255.4 lb

## 2019-05-19 DIAGNOSIS — M1990 Unspecified osteoarthritis, unspecified site: Secondary | ICD-10-CM

## 2019-05-19 DIAGNOSIS — N814 Uterovaginal prolapse, unspecified: Secondary | ICD-10-CM | POA: Diagnosis not present

## 2019-05-19 DIAGNOSIS — R300 Dysuria: Secondary | ICD-10-CM

## 2019-05-19 DIAGNOSIS — R197 Diarrhea, unspecified: Secondary | ICD-10-CM | POA: Diagnosis not present

## 2019-05-19 DIAGNOSIS — N390 Urinary tract infection, site not specified: Secondary | ICD-10-CM

## 2019-05-19 LAB — URINALYSIS, COMPLETE
Bilirubin, UA: NEGATIVE
Glucose, UA: NEGATIVE
Leukocytes,UA: NEGATIVE
Nitrite, UA: NEGATIVE
Protein,UA: NEGATIVE
Specific Gravity, UA: 1.02 (ref 1.005–1.030)
Urobilinogen, Ur: 0.2 mg/dL (ref 0.2–1.0)
pH, UA: 6 (ref 5.0–7.5)

## 2019-05-19 LAB — MICROSCOPIC EXAMINATION
Bacteria, UA: NONE SEEN
RBC, Urine: >30 /hpf — AB (ref 0–2)
Renal Epithel, UA: NONE SEEN /hpf

## 2019-05-19 MED ORDER — NITROFURANTOIN MONOHYD MACRO 100 MG PO CAPS: 100.0000 mg | ORAL_CAPSULE | Freq: Every day | ORAL | 3 refills | Status: DC

## 2019-05-19 MED ORDER — CELECOXIB 100 MG PO CAPS: 100.0000 mg | ORAL_CAPSULE | Freq: Every day | ORAL | 2 refills | Status: DC

## 2019-05-19 MED ORDER — CHOLESTYRAMINE 4 G PO PACK: 4.0000 g | PACK | Freq: Three times a day (TID) | ORAL | 12 refills | Status: DC

## 2019-05-19 NOTE — Telephone Encounter (Signed)
Rebekah [name] from [place] needing clarification from [place] on the directions of a prescription that was sent in for this patient for [facility].

## 2019-05-19 NOTE — Telephone Encounter (Signed)
Resent with corrected sig.

## 2019-05-19 NOTE — Progress Notes (Signed)
Acute Office Visit  Subjective:    Patient ID: Rebekah [name], female    DOB: 1955/05/09, 64 y.o.   MRN: [id number]  Chief Complaint  Patient presents with  . Arthritis  . Urinary Tract Infection    Arthritis Presents for follow-up visit. She complains of pain, stiffness and joint swelling. Affected locations include the right wrist, left wrist, right MCP, left MCP, left knee and right knee. Pertinent negatives include no dysuria, fatigue or fever.  Urinary Tract Infection  Associated symptoms include frequency and hematuria.   The patient also has recurrent urinary tract infections that have been going on in the past year.  She does have a significant uterine prolapse and cystocele.  She will be going to see her gynecologist next week, Dr. Marvel Plan.  They are going to discuss the issue.  I reviewed her last cultures and she was positive for Klebsiella.  Another risk factor is that she is doing healthy weight and wellness program and they have chronic very low carb diet and when she eats a fatty meal it causes her to have diarrhea because she does not have a gallbladder.  So she states that that is also why she might have more fecal soiling of the perineal area.  The patient was previously a Marine scientist.  In reviewing up-to-date, nitrofurantoin 100 mg daily is used as a preventative.  Plus the Klebsiella at the last culture was sensitive.  We are going to keep that as her prevention medicine for now and use it when she may have a flareup.  Urine was obtained today and will be sent for culture.  It was only positive for some red blood cells and mucus.  Otherwise it was negative.   She spoke with her cardiologist, Dr. Debara Pickett, about arthritis medication.  With her atrial fibrillation she takes Xarelto.  But she has severe deforming osteoarthritis of the hands knees hips and shoulders.  He discussed Celebrex as an option for her to try.  She does not want to take a narcotic medication.  At this time we  are going to try Celebrex 100 mg 1 daily for a week and then go to every other day and see if this can give her some relief.  She is a watch for signs of bleeding.  Past Medical History:  Diagnosis Date  . Anxiety   . Arthritis   . Bilateral bunions   . Complication of anesthesia   . Difficulty sleeping    prior sleep study did not reveal sleep apnea per patient  . DJD (degenerative joint disease)   . Dyspnea   . Dysrhythmia    a-fib  . Environmental allergies   . Food allergy   . Gallbladder problem   . GERD (gastroesophageal reflux disease)   . Heart murmur   . Hypertension    no meds after weight loss  . Hypothyroidism   . Incontinence of urine    at nite   . Joint pain   . Left atrial dilatation   . Left foot pain   . Leg edema   . Obesity    s/p gastric sleeve 12/2013 (previously weighed close to 400 lbs)  . Osteoarthritis   . Palpitations   . Paroxysmal atrial fibrillation (HCC)   . Pneumonia    hx of   . PONV (postoperative nausea and vomiting)   . Status post bilateral knee replacements   . Supplemental oxygen dependent    uses 2l/[place] at night, states HR  goes low and O2 drops  . Tuberculosis    had 6 month of INH due to exposure   . Vaginal vault prolapse     Past Surgical History:  Procedure Laterality Date  . APPENDECTOMY    . CHOLECYSTECTOMY    . EYE SURGERY     03/2014 lens implant left lens   . FOOT ARTHRODESIS Left 03/15/2016   Procedure: TALONAVICULAR AND SUBTALAR ARTHRODESIS;  Surgeon: Wylene Simmer, MD;  Location: [facility];  Service: Orthopedics;  Laterality: Left;  Marland Kitchen GASTROC RECESSION EXTREMITY Left 03/15/2016   Procedure: LEFT GASTROC RECESSION;  Surgeon: Wylene Simmer, MD;  Location: [facility];  Service: Orthopedics;  Laterality: Left;  . JOINT REPLACEMENT  1999   L TOTAL KNEE  . LAPAROSCOPIC GASTRIC SLEEVE RESECTION N/A 01/04/2014   Procedure: LAPAROSCOPIC GASTRIC SLEEVE RESECTION;  Surgeon: Greer Pickerel, MD;   Location: [facility];  Service: General;  Laterality: N/A;  . TONSILLECTOMY    . TOTAL KNEE ARTHROPLASTY Right 05/17/2015   Procedure: RIGHT TOTAL KNEE ARTHROPLASTY;  Surgeon: Paralee Cancel, MD;  Location: [facility];  Service: Orthopedics;  Laterality: Right;  . TRANSTHORACIC ECHOCARDIOGRAM  11/2012   EF 60-65%, mod LVH & mod conc hypertrophy, grade 1 diastolic dysfunction; LA mildly dilated; RV systolic pressure increased; PA peak pressure 67mmHg  . TUBAL LIGATION    . UPPER GI ENDOSCOPY  01/04/2014   Procedure: UPPER GI ENDOSCOPY;  Surgeon: Greer Pickerel, MD;  Location: [facility];  Service: General;;    Family History  Problem Relation Age of Onset  . Diabetes Father   . Hyperlipidemia Father   . Hypertension Father   . Heart disease Father   . Sudden death Father   . Anxiety disorder Father   . Obesity Father   . Uterine cancer Mother   . Cervical cancer Mother   . Breast cancer Mother   . Cancer Mother        cervical and brreast cancer  . Hypertension Mother   . Hyperlipidemia Mother   . Obesity Mother   . Diabetes Maternal Grandmother     Social History   Socioeconomic History  . Marital status: Married    Spouse name: Analia Denherder  . Number of children: 2  . Years of education: Not on file  . Highest education level: Not on file  Occupational History  . Occupation: Systems analyst: OTHER    Comment: [employer]  . Occupation: retired Marine scientist - L&D @ [employer]  Tobacco Use  . Smoking status: Never Smoker  . Smokeless tobacco: Never Used  Substance and Sexual Activity  . Alcohol use: No  . Drug use: No  . Sexual activity: Not on file  Other Topics Concern  . 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 Difficulty of Paying Living Expenses: Not on file  Food Insecurity:   . Worried  About Charity fundraiser in the Last Year: Not on file  . Ran Out of Food in the Last Year: Not on file  Transportation Needs:   . Lack of Transportation (Medical): Not on file  . Lack of Transportation (Non-Medical): Not on file  Physical Activity:   . Days of Exercise per Week: Not on file  . Minutes of Exercise per Session:  Not on file  Stress:   . Feeling of Stress : Not on file  Social Connections:   . Frequency of Communication with Friends and Family: Not on file  . Frequency of Social Gatherings with Friends and Family: Not on file  . Attends Religious Services: Not on file  . Active Member of Clubs or Organizations: Not on file  . Attends Archivist Meetings: Not on file  . Marital Status: Not on file  Intimate Partner Violence:   . Fear of Current or Ex-Partner: Not on file  . Emotionally Abused: Not on file  . Physically Abused: Not on file  . Sexually Abused: Not on file    Outpatient Medications Prior to Visit  Medication Sig Dispense Refill  . albuterol (PROVENTIL HFA;VENTOLIN HFA) 108 (90 Base) MCG/ACT inhaler Inhale 2 puffs into the lungs every 6 (six) hours as needed for wheezing or shortness of breath. 1 Inhaler 0  . Biotin 1000 MCG tablet Take 1,000 mcg by mouth daily.    Marland Kitchen CALCIUM PO Take 500 mg by mouth daily.     . cetirizine (ZYRTEC) 10 MG tablet Take 10 mg by mouth daily.    Marland Kitchen desmopressin (DDAVP) 0.2 MG tablet Take 3 tablets (0.6 mg total) by mouth at bedtime. 30 tablet 11  . diltiazem (CARDIZEM) 30 MG tablet TAKE ONE TO TWO TABLETS BY MOUTH EVERY 6 HOURS AS NEEDED FOR  FAST  HEART  RATES 30 tablet 1  . diltiazem (CARTIA XT) 120 MG 24 hr capsule TAKE 1 CAPSULE BY MOUTH ONCE DAILY 90 capsule 3  . EPINEPHrine (EPIPEN) 0.3 mg/0.3 mL IJ SOAJ injection Inject 0.3 mLs (0.3 mg total) into the muscle once as needed (anaphylaxis). 1 Device 6  . escitalopram (LEXAPRO) 20 MG tablet Take 1 tablet (20 mg total) by mouth daily. 90 tablet 0  . flecainide  (TAMBOCOR) 100 MG tablet Take 3 tablets by mouth at onset of fast heart rate as needed. 30 tablet 1  . furosemide (LASIX) 20 MG tablet TAKE 1 TABLET BY MOUTH ONCE DAILY 90 tablet 3  . levothyroxine (SYNTHROID) 50 MCG tablet TAKE 1 TABLET BY MOUTH ONCE DAILY BEFORE BREAKFAST 90 tablet 3  . losartan (COZAAR) 50 MG tablet Take 1 tablet (50 mg total) by mouth 2 (two) times daily. 180 tablet 3  . Multiple Vitamins-Minerals (MULTIVITAMIN ADULT PO) Take 1 capsule by mouth 2 (two) times daily. Journey 3+3 Bariatric Multivitamin    . OXYGEN Inhale 3 L/min into the lungs at bedtime. Continuously    . traMADol (ULTRAM) 50 MG tablet Take 1 tablet (50 mg total) by mouth every 6 (six) hours as needed. 60 tablet 2  . Vitamin D, Ergocalciferol, (DRISDOL) 1.25 MG (50000 UNIT) CAPS capsule Take 1 capsule (50,000 Units total) by mouth every 7 (seven) days. 12 capsule 0  . XARELTO 20 MG TABS tablet TAKE 1 TABLET BY MOUTH ONCE DAILY WITH SUPPER 90 tablet 2  . amoxicillin-clavulanate (AUGMENTIN) 875-125 MG tablet Take 1 tablet by mouth 2 (two) times daily. 20 tablet 0   No facility-administered medications prior to visit.    Allergies  Allergen Reactions  . Beef-Derived Products Anaphylaxis    After tick bite, cannot eat beef, pork or lamb  . Lambs Quarters Anaphylaxis    After tick bite cannot eat beef, pork or lamb  . Mucinex [Guaifenesin Er] Anaphylaxis  . Pork-Derived Products Anaphylaxis    After tick bite cannot eat beef, pork or lamb  . Darvon [Propoxyphene]  Other (See Comments)    Hallucinations   . Ace Inhibitors Swelling and Cough    Pedal Edema  . Ppd [Tuberculin Purified Protein Derivative]     Always has positive testing to PPD, do not use  . Hydromet [Hydrocodone-Homatropine] Itching and Other (See Comments)    Severe stomach pain-face itches.   . Sulfonamide Derivatives Rash    Review of Systems  Constitutional: Negative.  Negative for activity change, fatigue and fever.  HENT:  Negative.   Eyes: Negative.   Respiratory: Negative.  Negative for cough, shortness of breath and wheezing.   Cardiovascular: Negative.  Negative for chest pain.  Gastrointestinal: Negative.  Negative for abdominal pain.  Endocrine: Negative.   Genitourinary: Positive for frequency, hematuria, pelvic pain and vaginal pain. Negative for dysuria.  Musculoskeletal: Positive for arthralgias, arthritis, back pain, gait problem, joint swelling, myalgias and stiffness.  Skin: Negative.        Objective:    Physical Exam Constitutional:      General: She is not in acute distress.    Appearance: Normal appearance. She is well-developed.  HENT:     Head: Normocephalic and atraumatic.  Cardiovascular:     Rate and Rhythm: Normal rate.  Pulmonary:     Effort: Pulmonary effort is normal.  Skin:    General: Skin is warm and dry.     Findings: No rash.  Neurological:     Mental Status: She is alert and oriented to person, place, and time.     Deep Tendon Reflexes: Reflexes are normal and symmetric.     BP 140/87   Pulse 77   Temp 99.6 F (37.6 C)   Ht 5\' 1"  (1.549 m)   Wt 255 lb 6 oz (115.8 kg)   SpO2 93%   BMI 48.25 kg/m  Wt Readings from Last 3 Encounters:  05/19/19 255 lb 6 oz (115.8 kg)  05/06/19 257 lb (116.6 kg)  03/17/19 251 lb (113.9 kg)    Health Maintenance Due  Topic Date Due  . Hepatitis C Screening  09/09/54  . FOOT EXAM  09/09/1964  . OPHTHALMOLOGY EXAM  09/09/1964  . HIV Screening  09/09/1969  . TETANUS/TDAP  09/09/1973  . PAP SMEAR-Modifier  09/10/1975  . COLONOSCOPY  09/09/2004  . INFLUENZA VACCINE  11/15/2018    There are no preventive care reminders to display for this patient.   Lab Results  Component Value Date   TSH 1.660 12/18/2018   Lab Results  Component Value Date   WBC 6.5 12/18/2018   HGB 13.4 12/18/2018   HCT 41.5 12/18/2018   MCV 87 12/18/2018   PLT 117 (L) 05/18/2015   Lab Results  Component Value Date   NA 142 05/06/2019    K 4.9 05/06/2019   CO2 29 05/06/2019   GLUCOSE 87 05/06/2019   BUN 20 05/06/2019   CREATININE 1.01 (H) 05/06/2019   BILITOT 0.6 05/06/2019   ALKPHOS 77 05/06/2019   AST 20 05/06/2019   ALT 13 05/06/2019   PROT 7.6 05/06/2019   ALBUMIN 4.6 05/06/2019   CALCIUM 10.1 05/06/2019   ANIONGAP 8 05/18/2015   Lab Results  Component Value Date   CHOL 177 12/18/2018   Lab Results  Component Value Date   HDL 79 12/18/2018   Lab Results  Component Value Date   LDLCALC 85 12/18/2018   Lab Results  Component Value Date   TRIG 68 12/18/2018   Lab Results  Component Value Date   CHOLHDL 2.8 03/06/2018  Lab Results  Component Value Date   HGBA1C 5.4 12/18/2018       Assessment & Plan:   Problem List Items Addressed This Visit      Musculoskeletal and Integument   Arthritis   Relevant Medications   celecoxib (CELEBREX) 100 MG capsule     Genitourinary   Cystocele with uterine prolapse   Recurrent UTI   Relevant Medications   nitrofurantoin, macrocrystal-monohydrate, (MACROBID) 100 MG capsule    Other Visit Diagnoses    Dysuria    -  Primary   Relevant Orders   Urine Culture   Urinalysis, Complete   Diarrhea, unspecified type       Relevant Medications   cholestyramine (QUESTRAN) 4 g packet       Meds ordered this encounter  Medications  . nitrofurantoin, macrocrystal-monohydrate, (MACROBID) 100 MG capsule    Sig: Take 1 capsule (100 mg total) by mouth daily. 1 po BId    Dispense:  90 capsule    Refill:  3    Order Specific Question:   Supervising Provider    Answer:   Janora Norlander [id number]  . cholestyramine (QUESTRAN) 4 g packet    Sig: Take 1 packet (4 g total) by mouth 3 (three) times daily with meals.    Dispense:  60 each    Refill:  12    Order Specific Question:   Supervising Provider    Answer:   Janora Norlander [id number]  . celecoxib (CELEBREX) 100 MG capsule    Sig: Take 1 capsule (100 mg total) by mouth daily.    Dispense:  30  capsule    Refill:  2    Order Specific Question:   Supervising Provider    Answer:   Janora Norlander [id number]     Terald Sleeper, PA-C

## 2019-05-20 LAB — URINE CULTURE

## 2019-05-20 LAB — HM DIABETES EYE EXAM

## 2019-08-03 ENCOUNTER — Ambulatory Visit (INDEPENDENT_AMBULATORY_CARE_PROVIDER_SITE_OTHER): Payer: BC Managed Care – PPO | Admitting: Family Medicine

## 2019-08-04 ENCOUNTER — Encounter (INDEPENDENT_AMBULATORY_CARE_PROVIDER_SITE_OTHER): Payer: Self-pay | Admitting: Family Medicine

## 2019-08-04 NOTE — Telephone Encounter (Signed)
Please advise 

## 2019-08-13 ENCOUNTER — Encounter (INDEPENDENT_AMBULATORY_CARE_PROVIDER_SITE_OTHER): Payer: Self-pay | Admitting: Family Medicine

## 2019-08-13 ENCOUNTER — Ambulatory Visit (INDEPENDENT_AMBULATORY_CARE_PROVIDER_SITE_OTHER): Payer: BC Managed Care – PPO | Admitting: Family Medicine

## 2019-08-13 ENCOUNTER — Other Ambulatory Visit: Payer: Self-pay

## 2019-08-13 VITALS — BP 131/88 | HR 67 | Temp 98.3°F | Ht 61.0 in | Wt 265.0 lb

## 2019-08-13 DIAGNOSIS — E559 Vitamin D deficiency, unspecified: Secondary | ICD-10-CM

## 2019-08-13 DIAGNOSIS — Z6841 Body Mass Index (BMI) 40.0 and over, adult: Secondary | ICD-10-CM

## 2019-08-13 DIAGNOSIS — E038 Other specified hypothyroidism: Secondary | ICD-10-CM | POA: Diagnosis not present

## 2019-08-13 DIAGNOSIS — E8881 Metabolic syndrome: Secondary | ICD-10-CM | POA: Diagnosis not present

## 2019-08-13 DIAGNOSIS — F3289 Other specified depressive episodes: Secondary | ICD-10-CM | POA: Diagnosis not present

## 2019-08-13 MED ORDER — LEVOTHYROXINE SODIUM 50 MCG PO TABS: ORAL_TABLET | ORAL | 0 refills | Status: DC

## 2019-08-13 MED ORDER — VITAMIN D (ERGOCALCIFEROL) 1.25 MG (50000 UNIT) PO CAPS: 50000.0000 [IU] | ORAL_CAPSULE | ORAL | 0 refills | Status: DC

## 2019-08-13 MED ORDER — ESCITALOPRAM OXALATE 20 MG PO TABS: 20.0000 mg | ORAL_TABLET | Freq: Every day | ORAL | 0 refills | Status: DC

## 2019-08-13 NOTE — Progress Notes (Signed)
Chief Complaint:   OBESITY Rebekah [name] is here to discuss her progress with her obesity treatment plan along with follow-up of her obesity related diagnoses. Rebekah [name] is on the [place] and states she is following her eating plan approximately 0% of the time. Rebekah [name] states she is more active with her pet goats.  Today's visit was #: 23 Starting weight: 267 lbs Starting date: 01/01/2017 Today's weight: 265 lbs Today's date: 08/13/2019 Total lbs lost to date: 2 Total lbs lost since last in-office visit: 0  Interim History: Rebekah [name] has struggled with weight gain in the last 3 months. She has always been successful in the past with following a strict Atkins diet, but it has not been working well recently. She changed to 2 protein shakes per day and "regular" dinner, and she feels she is doing well with this.  Subjective:   1. Vitamin D deficiency Rebekah [name] is stable on Vit D, and she requests a refill today.  2. Other specified hypothyroidism Rebekah [name] is due to have her thyroid panel rechecked. Her last labs were within normal limits.  3. Insulin resistance Rebekah [name] has done well decreasing simple carbohydrates in her diet and is due for labs.  4. Other depression, emotional eating Rebekah [name]'s mood is stable on her medications, and she has done well decreasing emotional eating.  Assessment/Plan:   1. Vitamin D deficiency Low Vitamin D level contributes to fatigue and are associated with obesity, breast, and colon cancer. We will refill prescription Vitamin D for 90 days with no refills. Leighna will follow-up for routine testing of Vitamin D, at least 2-3 times per year to avoid over-replacement. We will check labs today.  - VITAMIN D 25 Hydroxy (Vit-D Deficiency, Fractures)  - Vitamin D, Ergocalciferol, (DRISDOL) 1.25 MG (50000 UNIT) CAPS capsule; Take 1 capsule (50,000 Units total) by mouth every 7 (seven) days.  Dispense: 12 capsule; Refill: 0  2. Other specified hypothyroidism Patient with long-standing  hypothyroidism. She appears euthyroid. We will check labs today, and we will refill levothyroxine for 90 days with no refills. Orders and follow up as documented in patient record.  Counseling . Good thyroid control is important for overall health. Supratherapeutic thyroid levels are dangerous and will not improve weight loss results. . The correct way to take levothyroxine is fasting, with water, separated by at least 30 minutes from breakfast, and separated by more than 4 hours from calcium, iron, multivitamins, acid reflux medications (PPIs).   - Comprehensive metabolic panel - Lipid Panel With LDL/HDL Ratio - T3 - T4, free - TSH  - levothyroxine (SYNTHROID) 50 MCG tablet; TAKE 1 TABLET BY MOUTH ONCE DAILY BEFORE BREAKFAST  Dispense: 90 tablet; Refill: 0  3. Insulin resistance Rebekah [name] will continue to work on weight loss, diet, exercise, and decreasing simple carbohydrates to help decrease the risk of diabetes. Rebekah [name] agreed to follow-up with [name] as directed to closely monitor her progress.  - Hemoglobin A1c - Insulin, random  4. Other depression, emotional eating Behavior modification techniques were discussed today to help Rebekah [name] deal with her emotional/non-hunger eating behaviors. We will refill Lexapro for 90 days with no refills. Orders and follow up as documented in patient record.   - escitalopram (LEXAPRO) 20 MG tablet; Take 1 tablet (20 mg total) by mouth daily.  Dispense: 90 tablet; Refill: 0  5. Class 3 severe obesity with serious comorbidity and body mass index (BMI) of 50.0 to 59.9 in adult, unspecified obesity type Rebekah [name]) Rebekah [name] is currently in the action  stage of change. As such, her goal is to continue with weight loss efforts. She has agreed to continue the [employer].   Exercise goals: As is.  Behavioral modification strategies: increasing lean protein intake and meal planning and cooking strategies.  Rebekah [name] has agreed to follow-up with our clinic in 8 weeks. She was informed  of the importance of frequent follow-up visits to maximize her success with intensive lifestyle modifications for her multiple health conditions.   Rebekah [name] was informed we would discuss her lab results at her next visit unless there is a critical issue that needs to be addressed sooner. Rebekah [name] agreed to keep her next visit at the agreed upon time to discuss these results.  Objective:   Blood pressure 131/88, pulse 67, temperature 98.3 F (36.8 C), temperature source Oral, height 5\' 1"  (1.549 m), weight 265 lb (120.2 kg), SpO2 95 %. Body mass index is 50.07 kg/m.  General: Cooperative, alert, well developed, in no acute distress. HEENT: Conjunctivae and lids unremarkable. Cardiovascular: Regular rhythm.  Lungs: Normal work of breathing. Neurologic: No focal deficits.   Lab Results  Component Value Date   CREATININE 1.01 (H) 05/06/2019   BUN 20 05/06/2019   NA 142 05/06/2019   K 4.9 05/06/2019   CL 99 05/06/2019   CO2 29 05/06/2019   Lab Results  Component Value Date   ALT 13 05/06/2019   AST 20 05/06/2019   ALKPHOS 77 05/06/2019   BILITOT 0.6 05/06/2019   Lab Results  Component Value Date   HGBA1C 5.4 12/18/2018   HGBA1C 5.5 07/29/2018   HGBA1C 5.5 03/06/2018   HGBA1C 5.7 (H) 12/02/2017   HGBA1C 5.5 05/02/2017   Lab Results  Component Value Date   INSULIN 7.1 12/18/2018   INSULIN 9.8 07/29/2018   INSULIN 6.5 03/06/2018   INSULIN 9.2 12/02/2017   INSULIN 7.9 05/02/2017   Lab Results  Component Value Date   TSH 1.660 12/18/2018   Lab Results  Component Value Date   CHOL 177 12/18/2018   HDL 79 12/18/2018   LDLCALC 85 12/18/2018   TRIG 68 12/18/2018   CHOLHDL 2.8 03/06/2018   Lab Results  Component Value Date   WBC 6.5 12/18/2018   HGB 13.4 12/18/2018   HCT 41.5 12/18/2018   MCV 87 12/18/2018   PLT 117 (L) 05/18/2015   No results found for: IRON, TIBC, FERRITIN  Attestation Statements:   Reviewed by clinician on day of visit: allergies, medications,  problem list, medical history, surgical history, family history, social history, and previous encounter notes.  Time spent on visit including pre-visit chart review and post-visit care and charting was 40 minutes.    I, Trixie Dredge, am acting as transcriptionist for Dennard Nip, MD.  I have reviewed the above documentation for accuracy and completeness, and I agree with the above. -  Dennard Nip, MD

## 2019-08-14 LAB — COMPREHENSIVE METABOLIC PANEL
ALT: 18 IU/L (ref 0–32)
AST: 21 IU/L (ref 0–40)
Albumin/Globulin Ratio: 1.4 (ref 1.2–2.2)
Albumin: 4.2 g/dL (ref 3.8–4.8)
Alkaline Phosphatase: 67 IU/L (ref 39–117)
BUN/Creatinine Ratio: 23 (ref 12–28)
BUN: 16 mg/dL (ref 8–27)
Bilirubin Total: 0.4 mg/dL (ref 0.0–1.2)
CO2: 25 mmol/L (ref 20–29)
Calcium: 9.9 mg/dL (ref 8.7–10.3)
Chloride: 102 mmol/L (ref 96–106)
Creatinine, Ser: 0.71 mg/dL (ref 0.57–1.00)
GFR calc Af Amer: 104 mL/min/{1.73_m2} (ref >59)
GFR calc non Af Amer: 90 mL/min/{1.73_m2} (ref >59)
Globulin, Total: 3 g/dL (ref 1.5–4.5)
Glucose: 80 mg/dL (ref 65–99)
Potassium: 4.1 mmol/L (ref 3.5–5.2)
Sodium: 140 mmol/L (ref 134–144)
Total Protein: 7.2 g/dL (ref 6.0–8.5)

## 2019-08-14 LAB — TSH: TSH: 1.78 u[IU]/mL (ref 0.450–4.500)

## 2019-08-14 LAB — T3: T3, Total: 95 ng/dL (ref 71–180)

## 2019-08-14 LAB — HEMOGLOBIN A1C
Est. average glucose Bld gHb Est-mCnc: 108 mg/dL
Hgb A1c MFr Bld: 5.4 % (ref 4.8–5.6)

## 2019-08-14 LAB — LIPID PANEL WITH LDL/HDL RATIO
Cholesterol, Total: 186 mg/dL (ref 100–199)
HDL: 72 mg/dL (ref >39)
LDL Chol Calc (NIH): 98 mg/dL (ref 0–99)
LDL/HDL Ratio: 1.4 ratio (ref 0.0–3.2)
Triglycerides: 86 mg/dL (ref 0–149)
VLDL Cholesterol Cal: 16 mg/dL (ref 5–40)

## 2019-08-14 LAB — INSULIN, RANDOM: INSULIN: 5 u[IU]/mL (ref 2.6–24.9)

## 2019-08-14 LAB — VITAMIN D 25 HYDROXY (VIT D DEFICIENCY, FRACTURES): Vit D, 25-Hydroxy: 31.7 ng/mL (ref 30.0–100.0)

## 2019-08-14 LAB — T4, FREE: Free T4: 1.2 ng/dL (ref 0.82–1.77)

## 2019-10-12 ENCOUNTER — Other Ambulatory Visit: Payer: Self-pay | Admitting: Internal Medicine

## 2019-10-12 ENCOUNTER — Encounter (INDEPENDENT_AMBULATORY_CARE_PROVIDER_SITE_OTHER): Payer: Self-pay | Admitting: Family Medicine

## 2019-10-12 ENCOUNTER — Other Ambulatory Visit: Payer: Self-pay

## 2019-10-12 ENCOUNTER — Ambulatory Visit (INDEPENDENT_AMBULATORY_CARE_PROVIDER_SITE_OTHER): Payer: Medicare PPO | Admitting: Family Medicine

## 2019-10-12 VITALS — BP 116/71 | HR 77 | Temp 98.1°F | Ht 61.0 in | Wt 257.0 lb

## 2019-10-12 DIAGNOSIS — Z6841 Body Mass Index (BMI) 40.0 and over, adult: Secondary | ICD-10-CM

## 2019-10-12 DIAGNOSIS — E559 Vitamin D deficiency, unspecified: Secondary | ICD-10-CM

## 2019-10-12 DIAGNOSIS — E038 Other specified hypothyroidism: Secondary | ICD-10-CM

## 2019-10-12 DIAGNOSIS — F3289 Other specified depressive episodes: Secondary | ICD-10-CM | POA: Diagnosis not present

## 2019-10-12 MED ORDER — VITAMIN D (ERGOCALCIFEROL) 1.25 MG (50000 UNIT) PO CAPS: 50000.0000 [IU] | ORAL_CAPSULE | ORAL | 0 refills | Status: DC

## 2019-10-12 MED ORDER — LEVOTHYROXINE SODIUM 50 MCG PO TABS: ORAL_TABLET | ORAL | 0 refills | Status: DC

## 2019-10-12 MED ORDER — ESCITALOPRAM OXALATE 20 MG PO TABS: 20.0000 mg | ORAL_TABLET | Freq: Every day | ORAL | 0 refills | Status: DC

## 2019-10-13 NOTE — Progress Notes (Signed)
Chief Complaint:   OBESITY Rebekah Rebekah [name] is here to discuss her progress with her obesity treatment plan along with follow-up of her obesity related diagnoses. Rebekah Rebekah [name] is on the [place] and states she is following her eating plan approximately 35-40% of the time. Rebekah Rebekah [name] states she is active while gardening, with grand kids and goats.  Today's visit was #: 53 Starting weight: 267 lbs Starting date: 01/01/2017 Today's weight: 257 lbs Today's date: 10/12/2019 Total lbs lost to date: 10 Total lbs lost since last in-office visit: 7  Interim History: Rebekah Rebekah [name] has done well with weight loss but she has been mostly trying to portion control and make smarter food choices. She will be traveling to visit her daughter for 2 weeks and she plans to continue to portion control and try to increase her protein.  Subjective:   1. Vitamin D deficiency Rebekah Rebekah [name] Vit D level is still not at goal, even on Vit D prescription and multivitamins.  2. Other specified hypothyroidism Rebekah Rebekah [name] labs are stable, and she requests a refill today.  3. Other depression, emotional eating Rebekah Rebekah [name] mood is stable on Lexapro. She requests a refill today.  Assessment/Plan:   1. Vitamin D deficiency Low Vitamin D level contributes to fatigue and are associated with obesity, breast, and colon cancer. We will refill prescription Vitamin D for 90 days with no refills. Rebekah Rebekah [name] will follow-up for routine testing of Vitamin D, at least 2-3 times per year to avoid over-replacement.  - Vitamin D, Ergocalciferol, (DRISDOL) 1.25 MG (50000 UNIT) CAPS capsule; Take 1 capsule (50,000 Units total) by mouth every 7 (seven) days.  Dispense: 12 capsule; Refill: 0  2. Other specified hypothyroidism Rebekah Rebekah [name] is with long-standing hypothyroidism, on levothyroxine therapy. She appears euthyroid. We will refill Synthroid for 90 days with no refills. Orders and follow up as documented in patient record.  - levothyroxine (SYNTHROID) 50 MCG tablet; TAKE 1 TABLET BY  MOUTH ONCE DAILY BEFORE BREAKFAST  Dispense: 90 tablet; Refill: 0  3. Other depression, emotional eating Behavior modification techniques were discussed today to help Rebekah Rebekah [name] deal with her emotional/non-hunger eating behaviors. We will refill Lexapro for 90 days with no refills. Orders and follow up as documented in patient record.   - escitalopram (LEXAPRO) 20 MG tablet; Take 1 tablet (20 mg total) by mouth daily.  Dispense: 90 tablet; Refill: 0  4. Class 3 severe obesity with serious comorbidity and body mass index (BMI) of 45.0 to 49.9 in adult, unspecified obesity type Rebekah Rebekah [name]) Rebekah Rebekah [name] is currently in the action stage of change. As such, her goal is to continue with weight loss efforts. She has agreed to follow her plan As is.   Exercise goals: As is.  Behavioral modification strategies: travel eating strategies and holiday eating strategies .  Rebekah Rebekah [name] has agreed to follow-up with our clinic in 3 months. She was informed of the importance of frequent follow-up visits to maximize her success with intensive lifestyle modifications for her multiple health conditions.   Objective:   Blood pressure 116/71, Rebekah [name] 77, temperature 98.1 F (36.7 C), temperature source Oral, height 5\' 1"  (1.549 m), weight 257 lb (116.6 kg), SpO2 97 %. Body mass index is 48.56 kg/m.  General: Cooperative, alert, well developed, in no acute distress. HEENT: Conjunctivae and lids unremarkable. Cardiovascular: Regular rhythm.  Lungs: Normal work of breathing. Neurologic: No focal deficits.   Lab Results  Component Value Date   CREATININE 0.71 08/13/2019   BUN 16 08/13/2019   NA 140 08/13/2019   K  4.1 08/13/2019   CL 102 08/13/2019   CO2 25 08/13/2019   Lab Results  Component Value Date   ALT 18 08/13/2019   AST 21 08/13/2019   ALKPHOS 67 08/13/2019   BILITOT 0.4 08/13/2019   Lab Results  Component Value Date   HGBA1C 5.4 08/13/2019   HGBA1C 5.4 12/18/2018   HGBA1C 5.5 07/29/2018   HGBA1C 5.5 03/06/2018    HGBA1C 5.7 (H) 12/02/2017   Lab Results  Component Value Date   INSULIN 5.0 08/13/2019   INSULIN 7.1 12/18/2018   INSULIN 9.8 07/29/2018   INSULIN 6.5 03/06/2018   INSULIN 9.2 12/02/2017   Lab Results  Component Value Date   TSH 1.780 08/13/2019   Lab Results  Component Value Date   CHOL 186 08/13/2019   HDL 72 08/13/2019   LDLCALC 98 08/13/2019   TRIG 86 08/13/2019   CHOLHDL 2.8 03/06/2018   Lab Results  Component Value Date   WBC 6.5 12/18/2018   HGB 13.4 12/18/2018   HCT 41.5 12/18/2018   MCV 87 12/18/2018   PLT 117 (L) 05/18/2015   No results found for: IRON, TIBC, FERRITIN   Obesity Behavioral Intervention Documentation for Insurance:   Approximately 15 minutes were spent on the discussion below.  ASK: We discussed the diagnosis of obesity with Rebekah Rebekah [name] today and Rebekah Rebekah [name] agreed to give [name] permission to discuss obesity behavioral modification therapy today.  ASSESS: Rebekah Rebekah [name] has the diagnosis of obesity and her BMI today is 48.58. Rebekah Rebekah [name] is in the action stage of change.   ADVISE: Rebekah Rebekah [name] was educated on the multiple health risks of obesity as well as the benefit of weight loss to improve her health. She was advised of the need for long term treatment and the importance of lifestyle modifications to improve her current health and to decrease her risk of future health problems.  AGREE: Multiple dietary modification options and treatment options were discussed and Rebekah [name] agreed to follow the recommendations documented in the above note.  ARRANGE: Rebekah [name] was educated on the importance of frequent visits to treat obesity as outlined per CMS and USPSTF guidelines and agreed to schedule her next follow up appointment today.  Attestation Statements:   Reviewed by clinician on day of visit: allergies, medications, problem list, medical history, surgical history, family history, social history, and previous encounter notes.   I, Trixie Dredge, am acting as transcriptionist for Dennard Nip, MD.  I have reviewed the above documentation for accuracy and completeness, and I agree with the above. -  Dennard Nip, MD

## 2019-10-17 ENCOUNTER — Other Ambulatory Visit: Payer: Self-pay | Admitting: Internal Medicine

## 2019-11-16 ENCOUNTER — Other Ambulatory Visit: Payer: Self-pay | Admitting: Internal Medicine

## 2019-11-17 ENCOUNTER — Encounter: Payer: Self-pay | Admitting: Family Medicine

## 2019-11-17 ENCOUNTER — Ambulatory Visit (INDEPENDENT_AMBULATORY_CARE_PROVIDER_SITE_OTHER): Payer: Medicare PPO | Admitting: Family Medicine

## 2019-11-17 ENCOUNTER — Other Ambulatory Visit: Payer: Self-pay

## 2019-11-17 VITALS — BP 130/82 | HR 76 | Temp 98.3°F | Ht 61.0 in | Wt 266.8 lb

## 2019-11-17 DIAGNOSIS — I10 Essential (primary) hypertension: Secondary | ICD-10-CM

## 2019-11-17 DIAGNOSIS — Z7689 Persons encountering health services in other specified circumstances: Secondary | ICD-10-CM

## 2019-11-17 DIAGNOSIS — F3289 Other specified depressive episodes: Secondary | ICD-10-CM

## 2019-11-17 DIAGNOSIS — I480 Paroxysmal atrial fibrillation: Secondary | ICD-10-CM | POA: Diagnosis not present

## 2019-11-17 DIAGNOSIS — G894 Chronic pain syndrome: Secondary | ICD-10-CM

## 2019-11-17 DIAGNOSIS — Z79899 Other long term (current) drug therapy: Secondary | ICD-10-CM

## 2019-11-17 DIAGNOSIS — E034 Atrophy of thyroid (acquired): Secondary | ICD-10-CM | POA: Diagnosis not present

## 2019-11-17 MED ORDER — LEVOTHYROXINE SODIUM 50 MCG PO TABS: ORAL_TABLET | ORAL | 3 refills | Status: DC

## 2019-11-17 MED ORDER — ESCITALOPRAM OXALATE 20 MG PO TABS: 20.0000 mg | ORAL_TABLET | Freq: Every day | ORAL | 3 refills | Status: DC

## 2019-11-17 MED ORDER — TRAMADOL HCL 50 MG PO TABS: 50.0000 mg | ORAL_TABLET | Freq: Two times a day (BID) | ORAL | 2 refills | Status: DC | PRN

## 2019-11-17 NOTE — Progress Notes (Signed)
Subjective: CC:est care, hypertension atrial fibrillation PCP: Janora Norlander, DO JJH:ERDE E Eid is a 64 y.o. female presenting to clinic today for:  1. HTN, Afib Patient reports compliance with her Cardizem, losartan, Lasix and Xarelto.  She does have occasional and recently more frequent, irregular heartbeat.  She is not had any recent runs of tachycardia but when she does have tachycardia she is usually well above 180 bpm.  She is never had cardioversion but only because she converted independently prior to the cardioversion.  She sees her cardiologist at least once a year but is frequently in contact with them via EMR.  Denies any hematochezia, melena, epistaxis or vaginal bleeding.  She does have vaginal prolapse however.  2. Hypothyroidism Acquired.  Never any history of surgery or radiation to the neck.  No family history of thyroid disease.  She has palpitations as above.  She is currently working with a weight loss specialist for weight loss.  She has history of gastric sleeve.  3. Chronic pain Patient with chronic joint pain.  Symptoms are typically fairly well controlled with Tylenol but sometimes she has to take tramadol for severe pain.  Typically she takes 1 to 2 tablets as a single dose along with her Tylenol and this relieves the pain.  She was diagnosed with erosive arthritis was previously managed by rheumatologist but was not on any rheumatologic medications and therefore she simply was referred to her PCP for pain management. Denies constipation, mental status changes (though thinks maybe some memory loss not related to meds), falls.  No serotonin syndrome symptoms with the Lexapro.    4.  Stress Patient reports stress due to a brother who suffers from addiction.  She has been treated with Lexapro for some time now.  She tried coming off of it before but found stress to be worse and therefore resumed it.  However, she notes that she is not quite sure that the Lexapro  does much to help with the stress she cannot really tell much of a difference before and after.  She is also been on Wellbutrin unfortunately gained weight without medication.  ROS: Per HPI  Allergies  Allergen Reactions  . Beef-Derived Products Anaphylaxis    After tick bite, cannot eat beef, pork or lamb  . Lambs Quarters Anaphylaxis    After tick bite cannot eat beef, pork or lamb  . Mucinex [Guaifenesin Er] Anaphylaxis  . Pork-Derived Products Anaphylaxis    After tick bite cannot eat beef, pork or lamb  . Darvon [Propoxyphene] Other (See Comments)    Hallucinations   . Ace Inhibitors Swelling and Cough    Pedal Edema  . Ppd [Tuberculin Purified Protein Derivative]     Always has positive testing to PPD, do not use  . Hydromet [Hydrocodone-Homatropine] Itching and Other (See Comments)    Severe stomach pain-face itches.   . Sulfonamide Derivatives Rash   Past Medical History:  Diagnosis Date  . Anxiety   . Arthritis   . Bilateral bunions   . Complication of anesthesia   . Difficulty sleeping    prior sleep study did not reveal sleep apnea per patient  . DJD (degenerative joint disease)   . Dyspnea   . Dysrhythmia    a-fib  . Environmental allergies   . Food allergy   . Gallbladder problem   . GERD (gastroesophageal reflux disease)   . Heart murmur   . Hypertension    no meds after weight loss  .  Hypothyroidism   . Incontinence of urine    at nite   . Joint pain   . Left atrial dilatation   . Left foot pain   . Leg edema   . Obesity    s/p gastric sleeve 12/2013 (previously weighed close to 400 lbs)  . Osteoarthritis   . Palpitations   . Paroxysmal atrial fibrillation (HCC)   . Pneumonia    hx of   . PONV (postoperative nausea and vomiting)   . Status post bilateral knee replacements   . Supplemental oxygen dependent    uses 2l/[address] at night, states HR goes low and O2 drops  . Tuberculosis    had 6 month of INH due to exposure   . Vaginal vault prolapse      Current Outpatient Medications:  .  Biotin 1000 MCG tablet, Take 1,000 mcg by mouth daily., Disp: , Rfl:  .  CALCIUM PO, Take 500 mg by mouth daily. , Disp: , Rfl:  .  cetirizine (ZYRTEC) 10 MG tablet, Take 10 mg by mouth daily., Disp: , Rfl:  .  Cholecalciferol (VITAMIN D) 50 MCG (2000 UT) CAPS, Take 1 capsule by mouth daily., Disp: , Rfl:  .  desmopressin (DDAVP) 0.2 MG tablet, Take 3 tablets (0.6 mg total) by mouth at bedtime., Disp: 30 tablet, Rfl: 11 .  diltiazem (CARDIZEM CD) 120 MG 24 hr capsule, TAKE 1 CAPSULE BY MOUTH ONCE DAILY, Disp: 90 capsule, Rfl: 3 .  diltiazem (CARDIZEM) 30 MG tablet, TAKE ONE TO TWO TABLETS BY MOUTH EVERY 6 HOURS AS NEEDED FOR  FAST  HEART  RATES, Disp: 30 tablet, Rfl: 1 .  EPINEPHrine (EPIPEN) 0.3 mg/0.3 mL IJ SOAJ injection, Inject 0.3 mLs (0.3 mg total) into the muscle once as needed (anaphylaxis)., Disp: 1 Device, Rfl: 6 .  escitalopram (LEXAPRO) 20 MG tablet, Take 1 tablet (20 mg total) by mouth daily., Disp: 90 tablet, Rfl: 0 .  flecainide (TAMBOCOR) 100 MG tablet, Take 3 tablets by mouth at onset of fast heart rate as needed., Disp: 30 tablet, Rfl: 1 .  furosemide (LASIX) 20 MG tablet, TAKE 1 TABLET BY MOUTH ONCE DAILY, Disp: 90 tablet, Rfl: 3 .  levothyroxine (SYNTHROID) 50 MCG tablet, TAKE 1 TABLET BY MOUTH ONCE DAILY BEFORE BREAKFAST, Disp: 90 tablet, Rfl: 0 .  losartan (COZAAR) 50 MG tablet, Take 1 tablet (50 mg total) by mouth 2 (two) times daily., Disp: 180 tablet, Rfl: 3 .  Multiple Vitamins-Minerals (MULTIVITAMIN ADULT PO), Take 1 capsule by mouth 2 (two) times daily. Journey 3+3 Bariatric Multivitamin, Disp: , Rfl:  .  nitrofurantoin, macrocrystal-monohydrate, (MACROBID) 100 MG capsule, Take 1 capsule (100 mg total) by mouth daily. If infection recurs, take 1 cap BID, Disp: 90 capsule, Rfl: 3 .  OXYGEN, Inhale 3 L/min into the lungs at bedtime. Continuously, Disp: , Rfl:  .  traMADol (ULTRAM) 50 MG tablet, Take 1 tablet (50 mg total) by mouth  every 6 (six) hours as needed., Disp: 60 tablet, Rfl: 2 .  Vitamin D, Ergocalciferol, (DRISDOL) 1.25 MG (50000 UNIT) CAPS capsule, Take 1 capsule (50,000 Units total) by mouth every 7 (seven) days., Disp: 12 capsule, Rfl: 0 .  XARELTO 20 MG TABS tablet, TAKE 1 TABLET BY MOUTH ONCE DAILY WITH SUPPER, Disp: 90 tablet, Rfl: 2 Social History   Socioeconomic History  . Marital status: Married    Spouse name: Jennine Peddy  . Number of children: 2  . Years of education: Not on file  .  Highest education level: Not on file  Occupational History  . Occupation: Systems analyst: OTHER    Comment: [place]  . Occupation: retired Marine scientist - L&D @ [employer]  Tobacco Use  . Smoking status: Never Smoker  . Smokeless tobacco: Never Used  Vaping Use  . Vaping Use: Never used  Substance and Sexual Activity  . Alcohol use: No  . Drug use: No  . Sexual activity: Not on file  Other Topics Concern  . 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 Difficulty of Paying Living Expenses:   Food Insecurity:   . Worried About Charity fundraiser in the Last Year:   . Arboriculturist in the Last Year:   Transportation Needs:   . Film/video editor (Medical):   Marland Kitchen Lack of Transportation (Non-Medical):   Physical Activity:   . Days of Exercise per Week:   . Minutes of Exercise per Session:   Stress:   . Feeling of Stress :   Social Connections:   . Frequency of Communication with Friends and Family:   . Frequency of Social Gatherings with Friends and Family:   . Attends Religious Services:   . Active Member of Clubs or Organizations:   . Attends Archivist Meetings:   Marland Kitchen Marital Status:   Intimate Partner Violence:   . Fear of Current or Ex-Partner:   . Emotionally Abused:   Marland Kitchen Physically Abused:   .  Sexually Abused:    Family History  Problem Relation Age of Onset  . Diabetes Father   . Hyperlipidemia Father   . Hypertension Father   . Heart disease Father   . Sudden death Father   . Anxiety disorder Father   . Obesity Father   . Uterine cancer Mother   . Cervical cancer Mother   . Breast cancer Mother   . Cancer Mother        cervical and brreast cancer  . Hypertension Mother   . Hyperlipidemia Mother   . Obesity Mother   . Diabetes Maternal Grandmother     Objective: Office vital signs reviewed. BP 130/82   Pulse 76   Temp 98.3 F (36.8 C)   Ht 5\' 1"  (1.549 m)   Wt 266 lb 12.8 oz (121 kg)   SpO2 95%   BMI 50.41 kg/m   Physical Examination:  General: Awake, alert, well appearing, obese female, No acute distress HEENT: Normal; slightly full feeling thyroid right but no discrete mass, nodule.  No exophthalmos.  No goiter Cardio: Irregularly irregular with rate control, S1S2 heard, no murmurs appreciated Pulm: clear to auscultation bilaterally, no wheezes, rhonchi or rales; normal work of breathing on room air Extremities: warm, well perfused  MSK: Degenerative/arthritic changes noted in the joints of the hands bilaterally.  Slight ulnar deviation noted. Skin: dry; intact; no rashes or lesions; normal temperature Neuro: No tremor Psych: Mood stable, speech normal, affect appropriate.  Good eye contact.  Does not appear to be responding to internal stimuli Depression screen [facility] 2/9 11/17/2019 11/17/2019 05/19/2019  Decreased Interest 0 0 0  Down, Depressed, Hopeless 0 0 0  PHQ - 2 Score 0 0 0  Altered sleeping - - 2  Tired, decreased energy - - 1  Change in appetite - -  0  Feeling bad or failure about yourself  - - 0  Trouble concentrating - - 0  Moving slowly or fidgety/restless - - 0  Suicidal thoughts - - 0  PHQ-9 Score - - 3  Difficult doing work/chores - - -  Some recent data might be hidden   Assessment/ Plan: 65 y.o. female   1. Paroxysmal A-fib  (HCC) Rate controlled.  Continue to follow-up with cardiology  2. Essential hypertension Controlled  3. Hypothyroidism due to acquired atrophy of thyroid Asymptomatic.  I reviewed her last thyroid panel which was normal.  Synthroid renewed - levothyroxine (SYNTHROID) 50 MCG tablet; TAKE 1 TABLET BY MOUTH ONCE DAILY BEFORE BREAKFAST  Dispense: 90 tablet; Refill: 3  4. Chronic pain syndrome Stable with sparing use of tramadol.  Last tramadol use was over 2 weeks ago.  I agree that she is not a good candidate for NSAIDs given chronic anticoagulation.  Tylenol preferred.  Tramadol is appropriate but I reiterated the risk with SSRI use and serotonin syndrome.  Use sparingly.  I have adjusted her dose to reflect current use.  She will follow-up every 3 months, sooner if needed for refills.  Controlled substance contract and UDS were obtained as per office policy today.  5. Controlled substance agreement signed - [employer] [place] 13 (MW), Urine  6. Establishing care with new doctor, encounter for  7. Other depression, emotional eating Would consider alternative medication if Lexapro is not especially effective.  Particular given use of tramadol.  Could consider GeneSight testing.  We will discuss this further at her next visit should she decide she would like to switch to something else.  Unsure if she is tried Cymbalta but this may be beneficial in her chronic pain. - escitalopram (LEXAPRO) 20 MG tablet; Take 1 tablet (20 mg total) by mouth daily.  Dispense: 90 tablet; Refill: 3  The Narcotic Database has been reviewed.  There were no red flags.     No orders of the defined types were placed in this encounter.  Meds ordered this encounter  Medications  . escitalopram (LEXAPRO) 20 MG tablet    Sig: Take 1 tablet (20 mg total) by mouth daily.    Dispense:  90 tablet    Refill:  3  . levothyroxine (SYNTHROID) 50 MCG tablet    Sig: TAKE 1 TABLET BY MOUTH ONCE DAILY BEFORE BREAKFAST     Dispense:  90 tablet    Refill:  3  . traMADol (ULTRAM) 50 MG tablet    Sig: Take 1-2 tablets (50-100 mg total) by mouth every 12 (twelve) hours as needed for severe pain.    Dispense:  60 tablet    Refill:  [place], [place] [phone]

## 2019-11-19 LAB — TOXASSURE SELECT 13 (MW), URINE

## 2020-01-04 ENCOUNTER — Encounter: Payer: Self-pay | Admitting: Family Medicine

## 2020-01-11 ENCOUNTER — Ambulatory Visit (INDEPENDENT_AMBULATORY_CARE_PROVIDER_SITE_OTHER): Payer: Medicare PPO | Admitting: Family Medicine

## 2020-01-22 ENCOUNTER — Other Ambulatory Visit: Payer: Self-pay | Admitting: Internal Medicine

## 2020-01-25 NOTE — Telephone Encounter (Signed)
Prescription refill request for Xarelto received.  Indication:  Atrial Fibrillation Last office visit: upcoming 03/2020 Weight: 121 kg Age:64 Scr: 0.71 07/2019 CrCl: 150.89 ml/min  Prescription refilled

## 2020-02-05 ENCOUNTER — Encounter: Payer: Self-pay | Admitting: Internal Medicine

## 2020-02-08 ENCOUNTER — Ambulatory Visit (INDEPENDENT_AMBULATORY_CARE_PROVIDER_SITE_OTHER): Payer: Medicare PPO | Admitting: Family Medicine

## 2020-02-11 ENCOUNTER — Encounter (INDEPENDENT_AMBULATORY_CARE_PROVIDER_SITE_OTHER): Payer: Self-pay | Admitting: Family Medicine

## 2020-02-11 ENCOUNTER — Other Ambulatory Visit: Payer: Self-pay

## 2020-02-11 ENCOUNTER — Ambulatory Visit (INDEPENDENT_AMBULATORY_CARE_PROVIDER_SITE_OTHER): Payer: Medicare PPO | Admitting: Family Medicine

## 2020-02-11 VITALS — BP 131/84 | HR 67 | Temp 98.1°F | Ht 61.0 in | Wt 269.0 lb

## 2020-02-11 DIAGNOSIS — E559 Vitamin D deficiency, unspecified: Secondary | ICD-10-CM

## 2020-02-11 DIAGNOSIS — Z6841 Body Mass Index (BMI) 40.0 and over, adult: Secondary | ICD-10-CM

## 2020-02-11 DIAGNOSIS — F3289 Other specified depressive episodes: Secondary | ICD-10-CM | POA: Diagnosis not present

## 2020-02-11 MED ORDER — ESCITALOPRAM OXALATE 20 MG PO TABS: 20.0000 mg | ORAL_TABLET | Freq: Every day | ORAL | 3 refills | Status: DC

## 2020-02-11 MED ORDER — VITAMIN D (ERGOCALCIFEROL) 1.25 MG (50000 UNIT) PO CAPS: 50000.0000 [IU] | ORAL_CAPSULE | ORAL | 0 refills | Status: DC

## 2020-02-12 LAB — VITAMIN D 25 HYDROXY (VIT D DEFICIENCY, FRACTURES): Vit D, 25-Hydroxy: 49.5 ng/mL (ref 30.0–100.0)

## 2020-02-15 NOTE — Progress Notes (Signed)
Chief Complaint:   OBESITY Rebekah [name] is here to discuss her progress with her obesity treatment plan along with follow-up of her obesity related diagnoses. Rebekah [name] is on the [place] and states she is following her eating plan approximately 80% of the time. Rebekah [name] states she is doing daily activities.   Today's visit was #: 91 Starting weight: 267 lbs Starting date: 01/01/2017 Today's weight: 269 lbs Today's date: 02/11/2020 Total lbs lost to date: 0 Total lbs lost since last in-office visit: 0  Interim History: Rebekah [name]'s last visit was approximately 3 months ago. She is trying a classic Atkins plan and "2 shakes and lean and green". She has dome more traveling and eating out and she is frustrated that her weight has gone in the wrong direction. She is status post weight loss surgery.  Subjective:   1. Vitamin D deficiency Rebekah [name] is due for labs, and she requests a refill of Vit D today.  2. Other depression, emotional eating Rebekah [name] is stable on Lexapro, and she is still working on emotional eating. She requests a refill of Lexapro today.  Assessment/Plan:   1. Vitamin D deficiency Low Vitamin D level contributes to fatigue and are associated with obesity, breast, and colon cancer. We will check labs today, and we will refill prescription Vitamin D for 90 days, with no refill. Addalyn will follow-up for routine testing of Vitamin D, at least 2-3 times per year to avoid over-replacement.  - VITAMIN D 25 Hydroxy (Vit-D Deficiency, Fractures) - Vitamin D, Ergocalciferol, (DRISDOL) 1.25 MG (50000 UNIT) CAPS capsule; Take 1 capsule (50,000 Units total) by mouth every 7 (seven) days.  Dispense: 12 capsule; Refill: 0  2. Other depression, emotional eating Behavior modification techniques were discussed today to help Rebekah [name] deal with her emotional/non-hunger eating behaviors. We will refill Lexapro for 90 days, with no refills. Orders and follow up as documented in patient record.   - escitalopram  (LEXAPRO) 20 MG tablet; Take 1 tablet (20 mg total) by mouth daily.  Dispense: 90 tablet; Refill: 3  3. Class 3 severe obesity with serious comorbidity and body mass index (BMI) of 50.0 to 59.9 in adult, unspecified obesity type [facility]) Rebekah [name] is currently in the action stage of change. As such, her goal is to continue with weight loss efforts. She has agreed to practicing portion control and making smarter food choices, such as increasing vegetables and decreasing simple carbohydrates.   We will recheck fasting labs at her next visit.  Rebekah [name] is to make sure she isn't eating less than 1,000 kcal.  Exercise goals: As is.  Behavioral modification strategies: no skipping meals and keeping a strict food journal.  Rebekah [name] has agreed to follow-up with our clinic in 3 months. She was informed of the importance of frequent follow-up visits to maximize her success with intensive lifestyle modifications for her multiple health conditions.   Rebekah [name] was informed we would discuss her lab results at her next visit unless there is a critical issue that needs to be addressed sooner. Rebekah [name] agreed to keep her next visit at the agreed upon time to discuss these results.  Objective:   Blood pressure 131/84, pulse 67, temperature 98.1 F (36.7 C), height 5\' 1"  (1.549 m), weight 269 lb (122 kg), SpO2 95 %. Body mass index is 50.83 kg/m.  General: Cooperative, alert, well developed, in no acute distress. HEENT: Conjunctivae and lids unremarkable. Cardiovascular: Regular rhythm.  Lungs: Normal work of breathing. Neurologic: No focal deficits.   Lab  Results  Component Value Date   CREATININE 0.71 08/13/2019   BUN 16 08/13/2019   NA 140 08/13/2019   K 4.1 08/13/2019   CL 102 08/13/2019   CO2 25 08/13/2019   Lab Results  Component Value Date   ALT 18 08/13/2019   AST 21 08/13/2019   ALKPHOS 67 08/13/2019   BILITOT 0.4 08/13/2019   Lab Results  Component Value Date   HGBA1C 5.4 08/13/2019   HGBA1C 5.4  12/18/2018   HGBA1C 5.5 07/29/2018   HGBA1C 5.5 03/06/2018   HGBA1C 5.7 (H) 12/02/2017   Lab Results  Component Value Date   INSULIN 5.0 08/13/2019   INSULIN 7.1 12/18/2018   INSULIN 9.8 07/29/2018   INSULIN 6.5 03/06/2018   INSULIN 9.2 12/02/2017   Lab Results  Component Value Date   TSH 1.780 08/13/2019   Lab Results  Component Value Date   CHOL 186 08/13/2019   HDL 72 08/13/2019   LDLCALC 98 08/13/2019   TRIG 86 08/13/2019   CHOLHDL 2.8 03/06/2018   Lab Results  Component Value Date   WBC 6.5 12/18/2018   HGB 13.4 12/18/2018   HCT 41.5 12/18/2018   MCV 87 12/18/2018   PLT 117 (L) 05/18/2015   No results found for: IRON, TIBC, FERRITIN  Obesity Behavioral Intervention:   Approximately 15 minutes were spent on the discussion below.  ASK: We discussed the diagnosis of obesity with Rebekah [name] today and Rebekah [name] agreed to give Rebekah [name] permission to discuss obesity behavioral modification therapy today.  ASSESS: Chitara has the diagnosis of obesity and her BMI today is 50.85. Deyonna is in the action stage of change.   ADVISE: Rebekah [name] was educated on the multiple health risks of obesity as well as the benefit of weight loss to improve her health. She was advised of the need for long term treatment and the importance of lifestyle modifications to improve her current health and to decrease her risk of future health problems.  AGREE: Multiple dietary modification options and treatment options were discussed and Rebekah [name] agreed to follow the recommendations documented in the above note.  ARRANGE: Rebekah [name] was educated on the importance of frequent visits to treat obesity as outlined per CMS and USPSTF guidelines and agreed to schedule her next follow up appointment today.  Attestation Statements:   Reviewed by clinician on day of visit: allergies, medications, problem list, medical history, surgical history, family history, social history, and previous encounter notes.   I, Trixie Dredge, am  acting as transcriptionist for Dennard Nip, MD.  I have reviewed the above documentation for accuracy and completeness, and I agree with the above. -  Dennard Nip, MD

## 2020-02-17 ENCOUNTER — Encounter: Payer: Self-pay | Admitting: Family Medicine

## 2020-02-17 ENCOUNTER — Ambulatory Visit: Payer: Medicare PPO | Admitting: Family Medicine

## 2020-02-17 ENCOUNTER — Other Ambulatory Visit: Payer: Self-pay

## 2020-02-17 VITALS — BP 128/80 | HR 69 | Temp 98.2°F | Ht 61.0 in | Wt 272.0 lb

## 2020-02-17 DIAGNOSIS — Z789 Other specified health status: Secondary | ICD-10-CM

## 2020-02-17 DIAGNOSIS — F3289 Other specified depressive episodes: Secondary | ICD-10-CM

## 2020-02-17 DIAGNOSIS — G894 Chronic pain syndrome: Secondary | ICD-10-CM | POA: Diagnosis not present

## 2020-02-17 DIAGNOSIS — Z1211 Encounter for screening for malignant neoplasm of colon: Secondary | ICD-10-CM

## 2020-02-17 DIAGNOSIS — Z1159 Encounter for screening for other viral diseases: Secondary | ICD-10-CM

## 2020-02-17 DIAGNOSIS — Z780 Asymptomatic menopausal state: Secondary | ICD-10-CM

## 2020-02-17 DIAGNOSIS — Z0000 Encounter for general adult medical examination without abnormal findings: Secondary | ICD-10-CM

## 2020-02-17 DIAGNOSIS — Z0001 Encounter for general adult medical examination with abnormal findings: Secondary | ICD-10-CM | POA: Diagnosis not present

## 2020-02-17 NOTE — Progress Notes (Signed)
Subjective:    Rebekah [name] is a 64 y.o. female who presents for a Welcome to Medicare exam.   Continues to use tramadol extremely sparingly.  She still on her first refill since this has been prescribed.  Does not report any adverse side effects.  She is very interested in transitioning over from Lexapro to Cymbalta that Cymbalta does have effect on musculoskeletal pain.  Given her A. fib and use of flecainide she wanted to make sure this would be an okay transition.   Review of Systems Positive for chronic pain, mood disturbance, hemorrhoids.        Objective:    Today's Vitals   02/17/20 1118  BP: 128/80  Pulse: 69  Temp: 98.2 F (36.8 C)  TempSrc: Temporal  SpO2: 94%  Weight: 272 lb (123.4 kg)  Height: 5\' 1"  (1.549 m)  PainSc: 3   Body mass index is 51.39 kg/m.  Medications Outpatient Encounter Medications as of 02/17/2020  Medication Sig  . Biotin 1000 MCG tablet Take 1,000 mcg by mouth daily.  Marland Kitchen CALCIUM PO Take 500 mg by mouth daily.   . cetirizine (ZYRTEC) 10 MG tablet Take 10 mg by mouth daily.  . Cholecalciferol (VITAMIN D) 50 MCG (2000 UT) CAPS Take 1 capsule by mouth daily.  Marland Kitchen diltiazem (CARDIZEM CD) 120 MG 24 hr capsule TAKE 1 CAPSULE BY MOUTH ONCE DAILY  . diltiazem (CARDIZEM) 30 MG tablet TAKE ONE TO TWO TABLETS BY MOUTH EVERY 6 HOURS AS NEEDED FOR  FAST  HEART  RATES  . EPINEPHrine (EPIPEN) 0.3 mg/0.3 mL IJ SOAJ injection Inject 0.3 mLs (0.3 mg total) into the muscle once as needed (anaphylaxis).  Marland Kitchen escitalopram (LEXAPRO) 20 MG tablet Take 1 tablet (20 mg total) by mouth daily.  . flecainide (TAMBOCOR) 100 MG tablet Take 3 tablets by mouth at onset of fast heart rate as needed.  . furosemide (LASIX) 20 MG tablet TAKE 1 TABLET BY MOUTH ONCE DAILY  . levothyroxine (SYNTHROID) 50 MCG tablet TAKE 1 TABLET BY MOUTH ONCE DAILY BEFORE BREAKFAST  . losartan (COZAAR) 50 MG tablet Take 1 tablet (50 mg total) by mouth 2 (two) times daily.  . Multiple  Vitamins-Minerals (MULTIVITAMIN ADULT PO) Take 1 capsule by mouth 2 (two) times daily. Journey 3+3 Bariatric Multivitamin  . OXYGEN Inhale 3 L/min into the lungs at bedtime. Continuously  . traMADol (ULTRAM) 50 MG tablet Take 1-2 tablets (50-100 mg total) by mouth every 12 (twelve) hours as needed for severe pain.  . Vitamin D, Ergocalciferol, (DRISDOL) 1.25 MG (50000 UNIT) CAPS capsule Take 1 capsule (50,000 Units total) by mouth every 7 (seven) days.  Alveda Reasons 20 MG TABS tablet TAKE 1 TABLET BY MOUTH ONCE DAILY WITH SUPPER  . [DISCONTINUED] desmopressin (DDAVP) 0.2 MG tablet Take 3 tablets (0.6 mg total) by mouth at bedtime.   No facility-administered encounter medications on file as of 02/17/2020.     History: Past Medical History:  Diagnosis Date  . Anxiety   . Arthritis   . Bilateral bunions   . Complication of anesthesia   . Difficulty sleeping    prior sleep study did not reveal sleep apnea per patient  . DJD (degenerative joint disease)   . Dyspnea   . Dysrhythmia    a-fib  . Environmental allergies   . Food allergy   . Gallbladder problem   . GERD (gastroesophageal reflux disease)   . Heart murmur   . Hypertension    no meds after  weight loss  . Hypothyroidism   . Incontinence of urine    at nite   . Joint pain   . Left atrial dilatation   . Left foot pain   . Leg edema   . Obesity    s/p gastric sleeve 12/2013 (previously weighed close to 400 lbs)  . Osteoarthritis   . Palpitations   . Paroxysmal atrial fibrillation (HCC)   . Pneumonia    hx of   . PONV (postoperative nausea and vomiting)   . Status post bilateral knee replacements   . Supplemental oxygen dependent    uses 2l/[place] at night, states HR goes low and O2 drops  . Tuberculosis    had 6 month of INH due to exposure   . Vaginal vault prolapse    Past Surgical History:  Procedure Laterality Date  . APPENDECTOMY    . CHOLECYSTECTOMY    . EYE SURGERY     03/2014 lens implant left lens   . FOOT  ARTHRODESIS Left 03/15/2016   Procedure: TALONAVICULAR AND SUBTALAR ARTHRODESIS;  Surgeon: Wylene Simmer, MD;  Location: [place];  Service: Orthopedics;  Laterality: Left;  Marland Kitchen GASTROC RECESSION EXTREMITY Left 03/15/2016   Procedure: LEFT GASTROC RECESSION;  Surgeon: Wylene Simmer, MD;  Location: [place];  Service: Orthopedics;  Laterality: Left;  . JOINT REPLACEMENT  1999   L TOTAL KNEE  . LAPAROSCOPIC GASTRIC SLEEVE RESECTION N/A 01/04/2014   Procedure: LAPAROSCOPIC GASTRIC SLEEVE RESECTION;  Surgeon: Greer Pickerel, MD;  Location: [facility];  Service: General;  Laterality: N/A;  . TONSILLECTOMY    . TOTAL KNEE ARTHROPLASTY Right 05/17/2015   Procedure: RIGHT TOTAL KNEE ARTHROPLASTY;  Surgeon: Paralee Cancel, MD;  Location: [facility];  Service: Orthopedics;  Laterality: Right;  . TRANSTHORACIC ECHOCARDIOGRAM  11/2012   EF 60-65%, mod LVH & mod conc hypertrophy, grade 1 diastolic dysfunction; LA mildly dilated; RV systolic pressure increased; PA peak pressure 50mmHg  . TUBAL LIGATION    . UPPER GI ENDOSCOPY  01/04/2014   Procedure: UPPER GI ENDOSCOPY;  Surgeon: Greer Pickerel, MD;  Location: [facility];  Service: General;;    Family History  Problem Relation Age of Onset  . Diabetes Father   . Hyperlipidemia Father   . Hypertension Father   . Heart disease Father   . Sudden death Father   . Anxiety disorder Father   . Obesity Father   . Uterine cancer Mother   . Cervical cancer Mother   . Breast cancer Mother   . Cancer Mother        cervical and brreast cancer  . Hypertension Mother   . Hyperlipidemia Mother   . Obesity Mother   . Diabetes Maternal Grandmother    Social History   Occupational History  . Occupation: Systems analyst: OTHER    Comment: [employer]  . Occupation: retired Marine scientist - L&D @ [employer]  Tobacco Use  . Smoking status: Never Smoker  . Smokeless tobacco: Never Used  Vaping Use  . Vaping Use: Never used   Substance and Sexual Activity  . Alcohol use: No  . Drug use: No  . Sexual activity: Not on file    Tobacco Counseling Counseling given: Not Answered   Immunizations and Health Maintenance Immunization History  Administered Date(s) Administered  . Fluad Quad(high Dose 65+) 12/25/2018  . Influenza Split 02/15/2012  . Influenza,inj,Quad PF,6+ Mos 01/06/2014, 01/30/2017, 01/26/2018  . Influenza-Unspecified 01/26/2018, 02/08/2020  .  PFIZER SARS-COV-2 Vaccination 08/17/2019, 09/07/2019, 02/08/2020  . Zoster 11/24/2015   Health Maintenance Due  Topic Date Due  . Hepatitis C Screening  Never done  . HIV Screening  Never done  . TETANUS/TDAP  Never done  . PAP SMEAR-Modifier  Never done  . COLONOSCOPY  Never done  . DEXA SCAN  Never done  . PNA vac Low Risk Adult (1 of 2 - PCV13) Never done    Activities of Daily Living In your present state of health, do you have any difficulty performing the following activities: 02/17/2020  Hearing? Y  Vision? N  Difficulty concentrating or making decisions? N  Walking or climbing stairs? N  Dressing or bathing? N  Doing errands, shopping? N  Preparing Food and eating ? N  Using the Toilet? N  In the past six months, have you accidently leaked urine? Y  Do you have problems with loss of bowel control? N  Managing your Medications? N  Managing your Finances? N  Housekeeping or managing your Housekeeping? N  Some recent data might be hidden    Physical Exam    General: Well-appearing, obese female in no acute distress. HEENT: Sclera white MSK: Ambulating independently Psych: Mood stable, speech normal, affect appropriate, patient is pleasant and interactive.  She does not appear to be responding to internal stimuli.  Advanced Directives: Patient notes she has completed this with cone before.  This is available under the media tab 01/09/2014.      Assessment:    This is a routine wellness examination for this patient  .  Vision/Hearing screen  Hearing Screening   125Hz  250Hz  500Hz  1000Hz  2000Hz  3000Hz  4000Hz  6000Hz  8000Hz   Right ear:           Left ear:           Comments: Left ear, congenital total nerve deafness  Vision Screening Comments: 20/20  Dietary issues and exercise activities discussed:  Current Exercise Habits: The patient does not participate in regular exercise at present, Exercise limited by: Other - see comments (weight)  Goals    . Exercise 3x per week (30 min per time)    . Weight (lb) < 200 lb (90.7 kg)      Depression Screen PHQ 2/9 Scores 02/17/2020 11/17/2019 11/17/2019 05/19/2019  PHQ - 2 Score 0 0 0 0  PHQ- 9 Score 1 - - 3     Fall Risk Fall Risk  11/17/2019  Falls in the past year? 1  Number falls in past yr: 1  Injury with Fall? 0  Risk for fall due to : History of fall(s)  Follow up Falls evaluation completed    Cognitive Function: MMSE - Mini Mental State Exam 02/17/2020  Orientation to time 5  Orientation to Place 5  Registration 3  Attention/ Calculation 5  Recall 3  Language- name 2 objects 2  Language- repeat 1  Language- follow 3 step command 3  Language- read & follow direction 1  Write a sentence 1  Copy design 1  Total score 30        Patient Care Team: Janora Norlander, DO as PCP - General (Family Medicine) Starlyn Skeans, MD as Consulting Physician (Family Medicine) Debara Pickett Nadean Corwin, MD as Consulting Physician (Cardiology) Paula Compton, MD as Consulting Physician (Obstetrics and Gynecology)     Plan:      1. Welcome to Medicare preventive visit I have located her advanced directives in the EMR.  Is under the media  tab dated 12/2013.  She is due for pneumococcal and tetanus shots but she wants to hold off on this.  Release of information completed for mammogram and Pap smear results from Dr. Paula Compton  2. Colon cancer screening Cologuard ordered. - Cologuard  3. Other depression, emotional eating I will consult with  clinical pharmacist with regards to transitioning over from Lexapro to Cymbalta in the setting of atrial fibrillation with flecainide and CCB use.  She only uses the flecainide as needed.  She is also chronically anticoagulated with Xarelto.  Continue to follow-up with Dr. Leafy Ro as directed  4. Chronic pain syndrome Continue sparing use of tramadol.  Recommend that she follow-up when she is on her last refill since she uses this sparingly.  We will plan to try and transition over to Cymbalta.  Plan for phone visit within 6 weeks to see how this medication changes going  5. Encounter for hepatitis C screening test for low risk patient She will get hepatitis C screen drawn with her future labs in January - Hepatitis C antibody; Future  6. Full code status  7. Asymptomatic postmenopausal estrogen deficiency DEXA scan ordered.  She will have this completed at her earliest [place]; Future   I have personally reviewed and noted the following in the patient's chart:   . Medical and social history . Use of alcohol, tobacco or illicit drugs  . Current medications and supplements . Functional ability and status . Nutritional status . Physical activity . Advanced directives . List of other physicians . Hospitalizations, surgeries, and ER visits in previous 12 months . Vitals . Screenings to include cognitive, depression, and falls . Referrals and appointments  In addition, I have reviewed and discussed with patient certain preventive protocols, quality metrics, and best practice recommendations. A written personalized care plan for preventive services as well as general preventive health recommendations were provided to patient.     Ronnie Doss, DO 02/17/2020

## 2020-02-22 ENCOUNTER — Encounter: Payer: Self-pay | Admitting: Family Medicine

## 2020-02-23 ENCOUNTER — Other Ambulatory Visit: Payer: Self-pay | Admitting: Family Medicine

## 2020-02-23 DIAGNOSIS — F3289 Other specified depressive episodes: Secondary | ICD-10-CM

## 2020-02-23 MED ORDER — DULOXETINE HCL 20 MG PO CPEP: 20.0000 mg | ORAL_CAPSULE | Freq: Every day | ORAL | 0 refills | Status: DC

## 2020-02-25 ENCOUNTER — Other Ambulatory Visit: Payer: Self-pay

## 2020-02-25 DIAGNOSIS — M1990 Unspecified osteoarthritis, unspecified site: Secondary | ICD-10-CM

## 2020-02-25 DIAGNOSIS — E038 Other specified hypothyroidism: Secondary | ICD-10-CM

## 2020-02-25 DIAGNOSIS — E559 Vitamin D deficiency, unspecified: Secondary | ICD-10-CM

## 2020-02-25 DIAGNOSIS — Z780 Asymptomatic menopausal state: Secondary | ICD-10-CM

## 2020-03-08 DIAGNOSIS — N814 Uterovaginal prolapse, unspecified: Secondary | ICD-10-CM | POA: Diagnosis not present

## 2020-03-16 ENCOUNTER — Encounter: Payer: Self-pay | Admitting: Family Medicine

## 2020-03-21 ENCOUNTER — Other Ambulatory Visit: Payer: Self-pay | Admitting: Family Medicine

## 2020-03-21 MED ORDER — DULOXETINE HCL 30 MG PO CPEP
ORAL_CAPSULE | ORAL | 0 refills | Status: DC
Start: 2020-03-21 — End: 2020-06-14

## 2020-04-06 ENCOUNTER — Ambulatory Visit: Payer: BC Managed Care – PPO | Admitting: Internal Medicine

## 2020-04-28 ENCOUNTER — Ambulatory Visit: Payer: BC Managed Care – PPO | Admitting: Internal Medicine

## 2020-05-03 ENCOUNTER — Encounter (INDEPENDENT_AMBULATORY_CARE_PROVIDER_SITE_OTHER): Payer: Self-pay | Admitting: Family Medicine

## 2020-05-03 ENCOUNTER — Encounter (INDEPENDENT_AMBULATORY_CARE_PROVIDER_SITE_OTHER): Payer: Self-pay

## 2020-05-04 ENCOUNTER — Telehealth (INDEPENDENT_AMBULATORY_CARE_PROVIDER_SITE_OTHER): Payer: Medicare PPO | Admitting: Family Medicine

## 2020-05-04 ENCOUNTER — Encounter (INDEPENDENT_AMBULATORY_CARE_PROVIDER_SITE_OTHER): Payer: Self-pay | Admitting: Family Medicine

## 2020-05-04 ENCOUNTER — Other Ambulatory Visit: Payer: Self-pay

## 2020-05-04 DIAGNOSIS — Z6841 Body Mass Index (BMI) 40.0 and over, adult: Secondary | ICD-10-CM | POA: Diagnosis not present

## 2020-05-04 DIAGNOSIS — E559 Vitamin D deficiency, unspecified: Secondary | ICD-10-CM

## 2020-05-04 DIAGNOSIS — F3289 Other specified depressive episodes: Secondary | ICD-10-CM

## 2020-05-04 MED ORDER — VITAMIN D (ERGOCALCIFEROL) 1.25 MG (50000 UNIT) PO CAPS: 50000.0000 [IU] | ORAL_CAPSULE | ORAL | 0 refills | Status: DC

## 2020-05-05 NOTE — Progress Notes (Signed)
TeleHealth Visit:  Due to the COVID-19 pandemic, this visit was completed with telemedicine (audio/video) technology to reduce patient and provider exposure as well as to preserve personal protective equipment.   Rebekah [name] has verbally consented to this TeleHealth visit. The patient is located at home, the provider is located at the [facility] office. The participants in this visit include the listed provider and patient. The visit was conducted today via MyChart video.   Chief Complaint: OBESITY Rebekah [name] is here to discuss her progress with her obesity treatment plan along with follow-up of her obesity related diagnoses. Rebekah [name] is on practicing portion control and making smarter food choices, such as increasing vegetables and decreasing simple carbohydrates and states she is following her eating plan approximately 90% of the time. Rebekah [name] states she is doing 0 minutes 0 times per week.  Today's visit was #: 46 Starting weight: 267 lbs Starting date: 01/01/2017  Interim History: Rebekah [name] has been trying to portion control and she is doing a modified [employer]. She has done well maintaining her weight over the holidays. She is trying to keep her dinner at approximately 500 calories but frequently is not meeting that. She journals and thinks her daily calories are approximately between 700-900 calories per day. She eats out 3-4 times per week, but she is making healthier choices.  Subjective:   1. Vitamin D deficiency Rebekah [name] is stable on Vit D, and she denies nausea or vomiting. She requests a refill today.  2. Other depression Rebekah [name]'s Lexapro recently changed to Cymbalta. She notes it makes her very sleepy. She has had a lot of stress and chronic pain in her life, but she is trying to avoid narcotics.  Assessment/Plan:   1. Vitamin D deficiency Low Vitamin D level contributes to fatigue and are associated with obesity, breast, and colon cancer. We will refill prescription Vitamin D for  90 days with no refills, and we will recheck labs in 2 months. Rebekah [name] will follow-up for routine testing of Vitamin D, at least 2-3 times per year to avoid over-replacement.  - Vitamin D, Ergocalciferol, (DRISDOL) 1.25 MG (50000 UNIT) CAPS capsule; Take 1 capsule (50,000 Units total) by mouth every 7 (seven) days.  Dispense: 12 capsule; Refill: 0  2. Other depression Behavior modification techniques were discussed today to help Rebekah [name] deal with her emotional/non-hunger eating behaviors. Rebekah [name] is to continue Cymbalta, and see if her fatigue improves. She was offered support and encouragement. Orders and follow up as documented in patient record.   3. Class 3 severe obesity with serious comorbidity and body mass index (BMI) of 50.0 to 59.9 in adult, unspecified obesity type Rebekah [name]) Rebekah [name] is currently in the action stage of change. As such, her goal is to continue with weight loss efforts. She has agreed to practicing portion control and making smarter food choices, such as increasing vegetables and decreasing simple carbohydrates.   Rebekah [name] is to have an IC test at her next visit to see if her RMR has decreased.  Behavioral modification strategies: increasing lean protein intake, decreasing simple carbohydrates and keeping a strict food journal.  Rebekah [name] has agreed to follow-up with our clinic in 8 weeks. She was informed of the importance of frequent follow-up visits to maximize her success with intensive lifestyle modifications for her multiple health conditions.  Objective:   VITALS: Per patient if applicable, see vitals. GENERAL: Alert and in no acute distress. CARDIOPULMONARY: No increased WOB. Speaking in clear sentences.  PSYCH: Pleasant and cooperative.  Speech normal rate and rhythm. Affect is appropriate. Insight and judgement are appropriate. Attention is focused, linear, and appropriate.  NEURO: Oriented as arrived to appointment on time with no prompting.   Lab Results  Component Value Date    CREATININE 0.71 08/13/2019   BUN 16 08/13/2019   NA 140 08/13/2019   K 4.1 08/13/2019   CL 102 08/13/2019   CO2 25 08/13/2019   Lab Results  Component Value Date   ALT 18 08/13/2019   AST 21 08/13/2019   ALKPHOS 67 08/13/2019   BILITOT 0.4 08/13/2019   Lab Results  Component Value Date   HGBA1C 5.4 08/13/2019   HGBA1C 5.4 12/18/2018   HGBA1C 5.5 07/29/2018   HGBA1C 5.5 03/06/2018   HGBA1C 5.7 (H) 12/02/2017   Lab Results  Component Value Date   INSULIN 5.0 08/13/2019   INSULIN 7.1 12/18/2018   INSULIN 9.8 07/29/2018   INSULIN 6.5 03/06/2018   INSULIN 9.2 12/02/2017   Lab Results  Component Value Date   TSH 1.780 08/13/2019   Lab Results  Component Value Date   CHOL 186 08/13/2019   HDL 72 08/13/2019   LDLCALC 98 08/13/2019   TRIG 86 08/13/2019   CHOLHDL 2.8 03/06/2018   Lab Results  Component Value Date   WBC 6.5 12/18/2018   HGB 13.4 12/18/2018   HCT 41.5 12/18/2018   MCV 87 12/18/2018   PLT 117 (L) 05/18/2015   No results found for: IRON, TIBC, FERRITIN  Attestation Statements:   Reviewed by clinician on day of visit: allergies, medications, problem list, medical history, surgical history, family history, social history, and previous encounter notes.   I, Trixie Dredge, am acting as transcriptionist for Dennard Nip, MD.  I have reviewed the above documentation for accuracy and completeness, and I agree with the above. - Dennard Nip, MD

## 2020-05-07 ENCOUNTER — Encounter (INDEPENDENT_AMBULATORY_CARE_PROVIDER_SITE_OTHER): Payer: Self-pay | Admitting: Family Medicine

## 2020-05-10 ENCOUNTER — Ambulatory Visit (INDEPENDENT_AMBULATORY_CARE_PROVIDER_SITE_OTHER): Payer: Medicare PPO

## 2020-05-10 ENCOUNTER — Ambulatory Visit: Payer: Medicare PPO | Admitting: Family Medicine

## 2020-05-10 ENCOUNTER — Encounter: Payer: Self-pay | Admitting: Family Medicine

## 2020-05-10 ENCOUNTER — Other Ambulatory Visit: Payer: Self-pay

## 2020-05-10 VITALS — BP 124/83 | HR 73 | Temp 97.5°F | Resp 20 | Ht 61.0 in | Wt 274.0 lb

## 2020-05-10 DIAGNOSIS — E559 Vitamin D deficiency, unspecified: Secondary | ICD-10-CM

## 2020-05-10 DIAGNOSIS — I480 Paroxysmal atrial fibrillation: Secondary | ICD-10-CM

## 2020-05-10 DIAGNOSIS — M1990 Unspecified osteoarthritis, unspecified site: Secondary | ICD-10-CM

## 2020-05-10 DIAGNOSIS — E038 Other specified hypothyroidism: Secondary | ICD-10-CM

## 2020-05-10 DIAGNOSIS — F3289 Other specified depressive episodes: Secondary | ICD-10-CM

## 2020-05-10 DIAGNOSIS — G894 Chronic pain syndrome: Secondary | ICD-10-CM

## 2020-05-10 DIAGNOSIS — E034 Atrophy of thyroid (acquired): Secondary | ICD-10-CM | POA: Diagnosis not present

## 2020-05-10 DIAGNOSIS — Z7901 Long term (current) use of anticoagulants: Secondary | ICD-10-CM | POA: Diagnosis not present

## 2020-05-10 DIAGNOSIS — R300 Dysuria: Secondary | ICD-10-CM | POA: Diagnosis not present

## 2020-05-10 DIAGNOSIS — Z780 Asymptomatic menopausal state: Secondary | ICD-10-CM

## 2020-05-10 LAB — URINALYSIS, COMPLETE
Bilirubin, UA: NEGATIVE
Glucose, UA: NEGATIVE
Nitrite, UA: NEGATIVE
Specific Gravity, UA: 1.025 (ref 1.005–1.030)
Urobilinogen, Ur: 0.2 mg/dL (ref 0.2–1.0)
pH, UA: 5.5 (ref 5.0–7.5)

## 2020-05-10 LAB — MICROSCOPIC EXAMINATION
Epithelial Cells (non renal): NONE SEEN /hpf (ref 0–10)
RBC, Urine: >30 /hpf — AB (ref 0–2)

## 2020-05-10 MED ORDER — CEPHALEXIN 500 MG PO CAPS: 500.0000 mg | ORAL_CAPSULE | Freq: Two times a day (BID) | ORAL | 0 refills | Status: AC

## 2020-05-10 NOTE — Progress Notes (Signed)
Subjective: CC: Hypothyroidism PCP: Janora Norlander, DO Rebekah [name] is a 65 y.o. female presenting to clinic today for:  1.  Hypothyroidism Compliant with synthroid 50 mcg.  No change in voice, bowel movements, voice. No tremor, palpitations.  2. Chronic pain Improving off lexapro and on Cymbalta.  Not impacting acute pain. Taking 60mg  for about 1 month now.  Causing some foggiheadedness.  Using tramadol extremely rarely.  3. Dysuria Patient feels like she is starting to get a UTI.  No fevers, nausea, vomiting or new back pain  ROS: Per HPI  Allergies  Allergen Reactions  . Beef-Derived Products Anaphylaxis    After tick bite, cannot eat beef, pork or lamb  . Lambs Quarters Anaphylaxis    After tick bite cannot eat beef, pork or lamb  . Mucinex [Guaifenesin Er] Anaphylaxis  . Pork-Derived Products Anaphylaxis    After tick bite cannot eat beef, pork or lamb  . Darvon [Propoxyphene] Other (See Comments)    Hallucinations   . Ace Inhibitors Swelling and Cough    Pedal Edema  . Ppd [Tuberculin Purified Protein Derivative]     Always has positive testing to PPD, do not use  . Hydromet [Hydrocodone-Homatropine] Itching and Other (See Comments)    Severe stomach pain-face itches.   . Sulfonamide Derivatives Rash   Past Medical History:  Diagnosis Date  . Anxiety   . Arthritis   . Bilateral bunions   . Complication of anesthesia   . Difficulty sleeping    prior sleep study did not reveal sleep apnea per patient  . DJD (degenerative joint disease)   . Dyspnea   . Dysrhythmia    a-fib  . Environmental allergies   . Food allergy   . Gallbladder problem   . GERD (gastroesophageal reflux disease)   . Heart murmur   . Hypertension    no meds after weight loss  . Hypothyroidism   . Incontinence of urine    at nite   . Joint pain   . Left atrial dilatation   . Left foot pain   . Leg edema   . Obesity    s/p gastric sleeve 12/2013 (previously weighed close  to 400 lbs)  . Osteoarthritis   . Palpitations   . Paroxysmal atrial fibrillation (HCC)   . Pneumonia    hx of   . PONV (postoperative nausea and vomiting)   . Status post bilateral knee replacements   . Supplemental oxygen dependent    uses 2l/[place] at night, states HR goes low and O2 drops  . Tuberculosis    had 6 month of INH due to exposure   . Vaginal vault prolapse     Current Outpatient Medications:  .  CALCIUM PO, Take 500 mg by mouth daily. , Disp: , Rfl:  .  cetirizine (ZYRTEC) 10 MG tablet, Take 10 mg by mouth daily., Disp: , Rfl:  .  Cholecalciferol (VITAMIN D) 50 MCG (2000 UT) CAPS, Take 1 capsule by mouth daily., Disp: , Rfl:  .  diltiazem (CARDIZEM CD) 120 MG 24 hr capsule, TAKE 1 CAPSULE BY MOUTH ONCE DAILY, Disp: 90 capsule, Rfl: 3 .  diltiazem (CARDIZEM) 30 MG tablet, TAKE ONE TO TWO TABLETS BY MOUTH EVERY 6 HOURS AS NEEDED FOR  FAST  HEART  RATES, Disp: 30 tablet, Rfl: 1 .  DULoxetine (CYMBALTA) 30 MG capsule, Take 1 capsule (30 mg total) by mouth daily for 14 days, THEN 2 capsules (60 mg total) daily., Disp:  180 capsule, Rfl: 0 .  EPINEPHrine (EPIPEN) 0.3 mg/0.3 mL IJ SOAJ injection, Inject 0.3 mLs (0.3 mg total) into the muscle once as needed (anaphylaxis)., Disp: 1 Device, Rfl: 6 .  flecainide (TAMBOCOR) 100 MG tablet, Take 3 tablets by mouth at onset of fast heart rate as needed., Disp: 30 tablet, Rfl: 1 .  furosemide (LASIX) 20 MG tablet, TAKE 1 TABLET BY MOUTH ONCE DAILY, Disp: 90 tablet, Rfl: 3 .  levothyroxine (SYNTHROID) 50 MCG tablet, TAKE 1 TABLET BY MOUTH ONCE DAILY BEFORE BREAKFAST, Disp: 90 tablet, Rfl: 3 .  losartan (COZAAR) 50 MG tablet, Take 1 tablet (50 mg total) by mouth 2 (two) times daily., Disp: 180 tablet, Rfl: 0 .  Multiple Vitamins-Minerals (MULTIVITAMIN ADULT PO), Take 1 capsule by mouth 2 (two) times daily. Journey 3+3 Bariatric Multivitamin, Disp: , Rfl:  .  OXYGEN, Inhale 3 L/min into the lungs at bedtime. Continuously, Disp: , Rfl:  .   traMADol (ULTRAM) 50 MG tablet, Take 1-2 tablets (50-100 mg total) by mouth every 12 (twelve) hours as needed for severe pain., Disp: 60 tablet, Rfl: 2 .  Vitamin D, Ergocalciferol, (DRISDOL) 1.25 MG (50000 UNIT) CAPS capsule, Take 1 capsule (50,000 Units total) by mouth every 7 (seven) days., Disp: 12 capsule, Rfl: 0 .  XARELTO 20 MG TABS tablet, TAKE 1 TABLET BY MOUTH ONCE DAILY WITH SUPPER, Disp: 90 tablet, Rfl: 1 Social History   Socioeconomic History  . Marital status: Married    Spouse name: Keydra Osorno  . Number of children: 2  . Years of education: Not on file  . Highest education level: Bachelor's degree (e.g., BA, AB, BS)  Occupational History  . Occupation: Systems analyst: OTHER    Comment: [employer]  . Occupation: retired Marine scientist - L&D @ [employer]  Tobacco Use  . Smoking status: Never Smoker  . Smokeless tobacco: Never Used  Vaping Use  . Vaping Use: Never used  Substance and Sexual Activity  . Alcohol use: No  . Drug use: No  . Sexual activity: Not on file  Other Topics Concern  . 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 Lack of Transportation (Medical): No  . Lack of Transportation (Non-Medical): No  Physical Activity: Inactive  . Days of Exercise per Week: 0 days  . Minutes of Exercise per Session: 0 min  Stress: Not on file  Social Connections: Socially Integrated  . Frequency of Communication with Friends and Family: More than three times a week  . Frequency of Social Gatherings with Friends and Family: More than three times a week  . Attends Religious Services: More than 4 times per year  . Active Member of Clubs or Organizations: Yes  . Attends Archivist Meetings: 1  to 4 times per year  . Marital Status: Married  Human resources officer Violence: Not on file   Family History  Problem Relation Age of Onset  . Diabetes Father   . Hyperlipidemia Father   . Hypertension Father   . Heart disease Father   . Sudden death Father   . Anxiety disorder Father   . Obesity Father   . Uterine cancer Mother   . Cervical cancer Mother   .  Breast cancer Mother   . Cancer Mother        cervical and brreast cancer  . Hypertension Mother   . Hyperlipidemia Mother   . Obesity Mother   . Diabetes Maternal Grandmother     Objective: Office vital signs reviewed. BP 124/83   Pulse 73   Temp (!) 97.5 F (36.4 C)   Resp 20   Ht 5\' 1"  (1.549 m)   Wt 274 lb (124.3 kg)   SpO2 97%   BMI 51.77 kg/m   Physical Examination:  General: Awake, alert, obese, No acute distress HEENT: Normal; no goiter Cardio: seemingly regular. S1S2 heard, no murmurs appreciated Pulm: clear to auscultation bilaterally, no wheezes, rhonchi or rales; normal work of breathing on room air Extremities: warm, well perfused, No edema, cyanosis or clubbing; +2 pulses bilaterally MSK: ambulating independently Skin: dry; intact; no rashes or lesions; normal temperature Neuro: no tremor  Results for orders placed or performed in visit on 05/10/20 (from the past 24 hour(s))  Urinalysis, Complete     Status: Abnormal   Collection Time: 05/10/20 10:55 AM  Result Value Ref Range   Specific Gravity, UA 1.025 1.005 - 1.030   pH, UA 5.5 5.0 - 7.5   Color, UA Red (A) Yellow   Appearance Ur Cloudy (A) Clear   Leukocytes,UA 3+ (A) Negative   Protein,UA 3+ (A) Negative/Trace   Glucose, UA Negative Negative   Ketones, UA Trace (A) Negative   RBC, UA 3+ (A) Negative   Bilirubin, UA Negative Negative   Urobilinogen, Ur 0.2 0.2 - 1.0 mg/dL   Nitrite, UA Negative Negative   Microscopic Examination See below:    Narrative   Performed at:  [facility] [address] [place], [place]   [id number] Lab Director: Colletta [facility], Phone:  [phone]  Microscopic Examination     Status: Abnormal   Collection Time: 05/10/20 10:55 AM   Urine  Result Value Ref Range   WBC, UA 0-5 0 - 5 /hpf   RBC >30 (A) 0 - 2 /hpf   Epithelial Cells (non renal) None seen 0 - 10 /hpf   Casts Present (A) None seen /lpf   Cast Type Hyaline casts N/A   Mucus, UA Present (A) Not Estab.   Bacteria, UA Few None seen/Few   Narrative   Performed at:  [facility] [address], [place], [place]  [id number] Lab Director: Colletta [facility], Phone:  [phone]    Assessment/ Plan: 65 y.o. female   Other depression - Plan: Thyroid Panel With TSH  Chronic pain syndrome  Hypothyroidism due to acquired atrophy of thyroid - Plan: Thyroid Panel With TSH  Paroxysmal A-fib (HCC)  Chronic anticoagulation - Plan: CBC  Dysuria - Plan: Urinalysis, Complete, Urine Culture, cephALEXin (KEFLEX) 500 MG capsule  Chronic pain and mood stable on Cymbalta.  Hope that fogginess improves with time  Check thyroid panel. Asymptomatic  Check CBC given anticoagulation  UA abnormal. UCx, Keflex.  Return if symptoms do not improve.  No orders of the defined types were placed in this encounter.  No orders of the defined types were placed in this encounter.    Janora Norlander, DO [place] [phone]

## 2020-05-11 ENCOUNTER — Encounter: Payer: Self-pay | Admitting: Family Medicine

## 2020-05-11 ENCOUNTER — Other Ambulatory Visit: Payer: Self-pay

## 2020-05-11 DIAGNOSIS — E034 Atrophy of thyroid (acquired): Secondary | ICD-10-CM

## 2020-05-11 LAB — CBC
Hematocrit: 41.5 % (ref 34.0–46.6)
Hemoglobin: 13.6 g/dL (ref 11.1–15.9)
MCH: 27.9 pg (ref 26.6–33.0)
MCHC: 32.8 g/dL (ref 31.5–35.7)
MCV: 85 fL (ref 79–97)
Platelets: 202 10*3/uL (ref 150–450)
RBC: 4.87 x10E6/uL (ref 3.77–5.28)
RDW: 13.1 % (ref 11.7–15.4)
WBC: 8.4 10*3/uL (ref 3.4–10.8)

## 2020-05-11 LAB — THYROID PANEL WITH TSH
Free Thyroxine Index: 1.9 (ref 1.2–4.9)
T3 Uptake Ratio: 24 % (ref 24–39)
T4, Total: 7.9 ug/dL (ref 4.5–12.0)
TSH: 4.62 u[IU]/mL — ABNORMAL HIGH (ref 0.450–4.500)

## 2020-05-12 ENCOUNTER — Encounter: Payer: Self-pay | Admitting: Family Medicine

## 2020-05-12 LAB — URINE CULTURE

## 2020-05-13 ENCOUNTER — Other Ambulatory Visit: Payer: Self-pay | Admitting: Family Medicine

## 2020-05-13 DIAGNOSIS — Z780 Asymptomatic menopausal state: Secondary | ICD-10-CM | POA: Diagnosis not present

## 2020-05-13 DIAGNOSIS — R3129 Other microscopic hematuria: Secondary | ICD-10-CM

## 2020-05-17 ENCOUNTER — Encounter: Payer: Self-pay | Admitting: Internal Medicine

## 2020-05-17 ENCOUNTER — Telehealth (INDEPENDENT_AMBULATORY_CARE_PROVIDER_SITE_OTHER): Payer: Medicare PPO | Admitting: Internal Medicine

## 2020-05-17 VITALS — BP 128/72 | HR 67 | Ht 61.0 in | Wt 262.0 lb

## 2020-05-17 DIAGNOSIS — I10 Essential (primary) hypertension: Secondary | ICD-10-CM | POA: Diagnosis not present

## 2020-05-17 DIAGNOSIS — I480 Paroxysmal atrial fibrillation: Secondary | ICD-10-CM

## 2020-05-17 DIAGNOSIS — R600 Localized edema: Secondary | ICD-10-CM

## 2020-05-17 MED ORDER — RIVAROXABAN 20 MG PO TABS: ORAL_TABLET | ORAL | 3 refills | Status: DC

## 2020-05-17 MED ORDER — FUROSEMIDE 20 MG PO TABS: ORAL_TABLET | ORAL | 3 refills | Status: DC

## 2020-05-17 MED ORDER — LOSARTAN POTASSIUM 50 MG PO TABS: 50.0000 mg | ORAL_TABLET | Freq: Two times a day (BID) | ORAL | 3 refills | Status: DC

## 2020-05-17 MED ORDER — DILTIAZEM HCL ER COATED BEADS 120 MG PO CP24: 120.0000 mg | ORAL_CAPSULE | Freq: Every day | ORAL | 3 refills | Status: DC

## 2020-05-17 MED ORDER — DILTIAZEM HCL 30 MG PO TABS: ORAL_TABLET | ORAL | 1 refills | Status: DC

## 2020-05-17 MED ORDER — FLECAINIDE ACETATE 100 MG PO TABS
ORAL_TABLET | ORAL | 1 refills | Status: DC
Start: 2020-05-17 — End: 2021-10-10

## 2020-05-17 NOTE — Addendum Note (Signed)
Addended by: Fidel Levy on: 05/17/2020 09:21 AM   Modules accepted: Orders

## 2020-05-17 NOTE — Patient Instructions (Signed)
Medication Instructions:  OK to hold Xarelto for 3 days as advised by Dr. Debara Pickett (UTI, hematuria) CONTINUE all other current medications  *If you need a refill on your cardiac medications before your next appointment, please call your pharmacy*  Follow-Up: At [facility], you and your health needs are our priority.  As part of our continuing mission to provide you with exceptional heart care, we have created designated Provider Care Teams.  These Care Teams include your primary Cardiologist (physician) and Advanced Practice Providers (APPs -  Physician Assistants and Nurse Practitioners) who all work together to provide you with the care you need, when you need it.  We recommend signing up for the patient portal called "MyChart".  Sign up information is provided on this After Visit Summary.  MyChart is used to connect with patients for Virtual Visits (Telemedicine).  Patients are able to view lab/test results, encounter notes, upcoming appointments, etc.  Non-urgent messages can be sent to your provider as well.   To learn more about what you can do with MyChart, go to [email or web address].    Your next appointment:   12 month(s)  The format for your next appointment:   In Person  Provider:   Dr. Lyman Bishop or Advanced Practice Provider on his care team - Duncan PA - Roby Lofts PA   Other Instructions

## 2020-05-17 NOTE — Progress Notes (Signed)
Virtual Visit via Telephone Note   This visit type was conducted due to national recommendations for restrictions regarding the COVID-19 Pandemic (e.g. social distancing) in an effort to limit this patient's exposure and mitigate transmission in our community.  Due to her co-morbid illnesses, this patient is at least at moderate risk for complications without adequate follow up.  The patient was not able to be contacted by video, therefore a telephone visit was performed. Please refer to the patient's chart for her consent to telehealth for [facility].    Date:  05/17/2020   ID:  Rebekah [name], DOB 1955/07/30, MRN [id number] The patient was identified using 2 identifiers.  Evaluation Performed:  Follow-Up Visit  Patient Location:  [place] [address]  Provider location:   [address] [place], [place] [address]  PCP:  Janora Norlander, DO  Cardiologist:  No primary care provider on file. Electrophysiologist:  None   Chief Complaint:  Telephone follow-up  History of Present Illness:    Rebekah [name] is a 65 y.o. female who presents via audio/video conferencing for a telehealth visit today.  Rebekah [name] is a pleasant 65 year old female with a past medical history significant for her super morbid obesity, chronic hypoxia, hypertension, GERD and prior knee replacement. She was previously followed by Dr. Rollene Fare and was being evaluated for bariatric surgery. She is here to establish new care with me today and for preoperative evaluation. 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 She appears he seen Dr. Rollene Fare will order an echocardiogram on August of 2014 which showed moderate thickening of the left ventricle with EF of 60-65%, mildly increased RV systolic pressure and no significant valvular disease. There  was left atrial enlargement. She denies any chest pain but does have expected shortness of breath with exertion. Lower extremity venous Dopplers were performed which show no evidence of significant venous insufficiency.    Rebekah [name] returns today for follow-up. She seems to be doing remarkably well. She underwent gastric sleeve surgery and has had close to 60 pound weight loss. Unfortunately she was complicated by postoperative atrial fibrillation. She was seen by Dr. Martinique and placed on Cardizem. Subsequently she converted to sinus rhythm within 24 hours spontaneously. She's had no recurrence since that time and has not been additionally anticoagulated. She was not continued on Cardizem. She does have an elevated CHADSVASC score of 2.  I saw Rebekah [name] back in the office today. Please see her recent notes when I saw her in the emergency room at [facility]. She was noted to be in atrial fibrillation again and given flecainide to help convert her, but this was not successful. She is placed on diltiazem and in addition to metoprolol. Rate slowed down and eventually she cardioverted on her own. We also started her on Xarelto which she's continues to take. Overall she's had very good rate control his heart rate is remained in the mid 40s to mid 60s. Blood pressure is somewhat labile and ranges between the low 90s up to the 140s. Overall she feels fairly well. She still has a few episodes of palpitations during the week when she feels that she is in A. fib although she feels like she is in A. fib today and her rhythm is sinus. She may not be sensitive to A. fib anymore.  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  I saw Rebekah [name] back in the office today. She's had remarkable weight loss. In fact her weight after sleeve gastrectomy is down 288 pounds. Her main issue now is excess tissue for which she's tried to get plastic surgery but has been denied multiple times. She reports no  further palpitations or atrial fibrillation. She continues to take Xarelto without any bleeding complications. She continues to use nocturnal oxygen due to hypoxia, although her sleep study indicated no sleep apnea, her overnight oximetry study did indicate hypoxia with a low O2 sats ration of 86%. This was, of course prior to the recent 80-100 pound weight loss. She also reports recently she's been having epistaxis. In fact she had an episode that lasted for 10-12 hours, she used a box of 40 micro-tampons to try to get it to stop and then eventually tried to go to the emergency room. She is using nasal saline but feels that the oxygen at night is probably drying her nose out.  Rebekah [name] was seen today for an urgent add-on. This morning she was awakened by her alarm clock around 5 AM and noticed that her heart was racing when she woke up. It did not slow down right away and she felt increasingly more fatigue. She said after taking a shower she felt dizzy and had some chest pressure. Her heart rate was very elevated and she tried to take it noting it was "greater than 200". She decided then take 300 mg of flecainide. Subsequently she took 2 diltiazem's and after several hours he decided to come in for her appointment with the orthopedist. She says that on the way in to the office her heart rate slowed down and she felt better. When she saw her orthopedist they felt that she should be reevaluated by [name] here. Currently she is asymptomatic. EKG was performed showing sinus rhythm with sinus arrhythmia at 81.  Rebekah [name] returns today for follow-up. She had been reporting some worsening leg swelling however this seems to be improved somewhat today. She has had no recurrent paroxysmal arrhythmias. She continues to want to use the pocket pill strategy.  11/21/2016  Rebekah [name] returns a for follow-up. Is been over year since I last saw her. She was last seen in the A. fib clinic for follow-up and is reported very  infrequent use of flecainide. She says that she's used it a few times over the past year and has been successful in converting her to sinus rhythm. Of most notable is that she's had significant weight gain. Weight in March 2017 was 188 pounds and now up to 271 pounds. She was 204 pounds in January. She says this is typical to many factors including immobility after foot surgery, social stressors, brother who had a stroke and poor eating habits. She has a significant understanding of nutrition and her dietary needs but has had difficulty balancing that. She reports that she is consuming only 600-700 cal a day and has lost 20 pounds but has recently plateaued.  03/15/2017  Rebekah [name] returns today for follow-up.  I last saw her about 3 months ago.  We were working on her blood pressure which has been elevated.  Mostly it appears that her blood pressure is elevated in the morning.  She does not have sleep apnea however has had infrequent nocturnal oxygen desaturations and uses oxygen at night.  This may be contributing to her elevated blood pressure in the morning.  Recently she has been taking extra losartan 50 mg at night but then takes 25 mg in the morning.  I reviewed her blood home blood pressures and they appear to be well controlled between [id number] systolic and the 0000000 and [id number] diastolic.  She reports infrequent atrial fibrillation.  The episodes however now are longer lasting and may last throughout the day, despite taking her flecainide.  They eventually go away however it is not clear if it works as quickly.  We considered and discussed monitoring and I presented to her an option of using the alive core application.  01/03/2018  Rebekah [name] is seen today in follow-up.  She reported recently having had some occasional palpitations.  She thought she experienced an allergic reaction and took up 200 mg of Benadryl over 4 hours.  Absolutely she had some A. fib and then she took her flecainide within 24  hours.  I strongly advised against this at this point as there is significant interaction between Benadryl and flecainide which could potentiate the levels of flecainide and potentially cause QT prolongation.  He had considered using her EpiPen, and I supported this if she ever feels that she may be having anaphylaxis.  Overall, she denies any chest pain or worsening shortness of breath.  She is been working with the weight management center and unfortunately has not lost a lot of weight but recently started on the original Atkins diet and has had some benefit there.  Blood pressures were running slightly higher and she increased her medicine dose to 50 mg twice daily of the losartan.  I reviewed her home blood pressure readings and the appear to be well controlled.  She is tolerating Xarelto without any difficulty.  And as I mentioned she is only had a couple of episodes of A. fib for which she has needed the pocket pill flecainide.  12/23/2018  Rebekah [name] is seen today in follow-up.  She has had a couple episodes of persistent atrial fibrillation despite taking flecainide and diltiazem.  Overall though she says her episodes are short and typically occur less than 1 time a month.  Her blood pressure is elevated today however she feels there is a whitecoat component to this.  Her home blood pressures were reviewed and seem to be normal.  Recently she has had some readings suggestive of some daytime hypoxia.  She continues to use some oxygen at night although feels no different on the days she does not use it.  She did have a repeat echocardiogram which showed normal systolic function and no evidence of pulmonary hypertension.  For some reason her oxygen saturations have improved recently.  She continues to lose some weight and is working with Dr. Leafy Ro.    05/17/2020  Doing well, no chest pain. Has been working on weight loss- upcoming lipid profile and thyroid. Sent a number of strips showing some  breakthrough afib. Cholesterol <200, LDL 89 - blood pressure control is good. Seems to be doing well with the pill in the pocket strategy. Uses 3L oxygen at night.   The patient does not have symptoms concerning for COVID-19 infection (fever, chills, cough, or new SHORTNESS OF BREATH).    Prior CV studies:   The following studies were reviewed today:  Chart reviewed, labwork  PMHx:  Past Medical History:  Diagnosis Date  . Anxiety   . Arthritis   . Bilateral bunions   . Complication of anesthesia   . Difficulty sleeping    prior sleep  study did not reveal sleep apnea per patient  . DJD (degenerative joint disease)   . Dyspnea   . Dysrhythmia    a-fib  . Environmental allergies   . Food allergy   . Gallbladder problem   . GERD (gastroesophageal reflux disease)   . Heart murmur   . Hypertension    no meds after weight loss  . Hypothyroidism   . Incontinence of urine    at nite   . Joint pain   . Left atrial dilatation   . Left foot pain   . Leg edema   . Obesity    s/p gastric sleeve 12/2013 (previously weighed close to 400 lbs)  . Osteoarthritis   . Palpitations   . Paroxysmal atrial fibrillation (HCC)   . Pneumonia    hx of   . PONV (postoperative nausea and vomiting)   . Status post bilateral knee replacements   . Supplemental oxygen dependent    uses 2l/[place] at night, states HR goes low and O2 drops  . Tuberculosis    had 6 month of INH due to exposure   . Vaginal vault prolapse     Past Surgical History:  Procedure Laterality Date  . APPENDECTOMY    . CHOLECYSTECTOMY    . EYE SURGERY     03/2014 lens implant left lens   . FOOT ARTHRODESIS Left 03/15/2016   Procedure: TALONAVICULAR AND SUBTALAR ARTHRODESIS;  Surgeon: Wylene Simmer, MD;  Location: [facility];  Service: Orthopedics;  Laterality: Left;  Marland Kitchen GASTROC RECESSION EXTREMITY Left 03/15/2016   Procedure: LEFT GASTROC RECESSION;  Surgeon: Wylene Simmer, MD;  Location: [facility];  Service: Orthopedics;  Laterality: Left;  . JOINT REPLACEMENT  1999   L TOTAL KNEE  . LAPAROSCOPIC GASTRIC SLEEVE RESECTION N/A 01/04/2014   Procedure: LAPAROSCOPIC GASTRIC SLEEVE RESECTION;  Surgeon: Greer Pickerel, MD;  Location: [facility];  Service: General;  Laterality: N/A;  . TONSILLECTOMY    . TOTAL KNEE ARTHROPLASTY Right 05/17/2015   Procedure: RIGHT TOTAL KNEE ARTHROPLASTY;  Surgeon: Paralee Cancel, MD;  Location: [facility];  Service: Orthopedics;  Laterality: Right;  . TRANSTHORACIC ECHOCARDIOGRAM  11/2012   EF 60-65%, mod LVH & mod conc hypertrophy, grade 1 diastolic dysfunction; LA mildly dilated; RV systolic pressure increased; PA peak pressure 41mmHg  . TUBAL LIGATION    . UPPER GI ENDOSCOPY  01/04/2014   Procedure: UPPER GI ENDOSCOPY;  Surgeon: Greer Pickerel, MD;  Location: [facility];  Service: General;;    FAMHx:  Family History  Problem Relation Age of Onset  . Diabetes Father   . Hyperlipidemia Father   . Hypertension Father   . Heart disease Father   . Sudden death Father   . Anxiety disorder Father   . Obesity Father   . Uterine cancer Mother   . Cervical cancer Mother   . Breast cancer Mother   . Cancer Mother        cervical and brreast cancer  . Hypertension Mother   . Hyperlipidemia Mother   . Obesity Mother   . Diabetes Maternal Grandmother     SOCHx:   reports that she has never smoked. She has never used smokeless tobacco. She reports that she does not drink alcohol and does not use drugs.  ALLERGIES:  Allergies  Allergen Reactions  . Beef-Derived Products Anaphylaxis    After tick bite, cannot eat beef, pork or lamb  . Lambs Quarters Anaphylaxis    After tick bite  cannot eat beef, pork or lamb  . Mucinex [Guaifenesin Er] Anaphylaxis  . Pork-Derived Products Anaphylaxis    After tick bite cannot eat beef, pork or lamb  . Darvon [Propoxyphene] Other (See Comments)    Hallucinations   . Ace Inhibitors Swelling and Cough    Pedal Edema  . Ppd  [Tuberculin Purified Protein Derivative]     Always has positive testing to PPD, do not use  . Hydromet [Hydrocodone-Homatropine] Itching and Other (See Comments)    Severe stomach pain-face itches.   . Sulfonamide Derivatives Rash    MEDS:  Current Meds  Medication Sig  . CALCIUM PO Take 500 mg by mouth daily.   . cephALEXin (KEFLEX) 500 MG capsule Take 1 capsule (500 mg total) by mouth 2 (two) times daily for 7 days.  . cetirizine (ZYRTEC) 10 MG tablet Take 10 mg by mouth daily.  . Cholecalciferol (VITAMIN D) 50 MCG (2000 UT) CAPS Take 1 capsule by mouth daily.  Marland Kitchen diltiazem (CARDIZEM CD) 120 MG 24 hr capsule TAKE 1 CAPSULE BY MOUTH ONCE DAILY  . diltiazem (CARDIZEM) 30 MG tablet TAKE ONE TO TWO TABLETS BY MOUTH EVERY 6 HOURS AS NEEDED FOR  FAST  HEART  RATES  . docusate sodium (COLACE) 100 MG capsule daily as needed.  . DULoxetine (CYMBALTA) 30 MG capsule Take 1 capsule (30 mg total) by mouth daily for 14 days, THEN 2 capsules (60 mg total) daily.  Marland Kitchen EPINEPHrine (EPIPEN) 0.3 mg/0.3 mL IJ SOAJ injection Inject 0.3 mLs (0.3 mg total) into the muscle once as needed (anaphylaxis).  . flecainide (TAMBOCOR) 100 MG tablet Take 3 tablets by mouth at onset of fast heart rate as needed.  . furosemide (LASIX) 20 MG tablet TAKE 1 TABLET BY MOUTH ONCE DAILY  . levothyroxine (SYNTHROID) 50 MCG tablet TAKE 1 TABLET BY MOUTH ONCE DAILY BEFORE BREAKFAST  . losartan (COZAAR) 50 MG tablet Take 1 tablet (50 mg total) by mouth 2 (two) times daily.  . Multiple Vitamins-Minerals (MULTIVITAMIN ADULT PO) Take 1 capsule by mouth 2 (two) times daily. Journey 3+3 Bariatric Multivitamin  . OXYGEN Inhale 3 L/min into the lungs at bedtime. Continuously  . traMADol (ULTRAM) 50 MG tablet Take 1-2 tablets (50-100 mg total) by mouth every 12 (twelve) hours as needed for severe pain.  . Vitamin D, Ergocalciferol, (DRISDOL) 1.25 MG (50000 UNIT) CAPS capsule Take 1 capsule (50,000 Units total) by mouth every 7 (seven) days.   Alveda Reasons 20 MG TABS tablet TAKE 1 TABLET BY MOUTH ONCE DAILY WITH SUPPER     ROS: Pertinent items noted in HPI and remainder of comprehensive ROS otherwise negative.  Labs/Other Tests and Data Reviewed:    Recent Labs: 08/13/2019: ALT 18; BUN 16; Creatinine, Ser 0.71; Potassium 4.1; Sodium 140 05/10/2020: Hemoglobin 13.6; Platelets 202; TSH 4.620   Recent Lipid Panel Lab Results  Component Value Date/Time   CHOL 186 08/13/2019 02:58 PM   TRIG 86 08/13/2019 02:58 PM   HDL 72 08/13/2019 02:58 PM   CHOLHDL 2.8 03/06/2018 01:19 PM   CHOLHDL 4.2 07/02/2013 01:01 PM   LDLCALC 98 08/13/2019 02:58 PM    Wt Readings from Last 3 Encounters:  05/17/20 262 lb (118.8 kg)  05/10/20 274 lb (124.3 kg)  02/17/20 272 lb (123.4 kg)     Exam:    Vital Signs:  BP 128/72   Pulse 67   Ht 5\' 1"  (1.549 m)   Wt 262 lb (118.8 kg)   SpO2 96%  BMI 49.50 kg/m    Deferred due to telephone visit  ASSESSMENT & PLAN:    1. Paroxysmal atrial fibrillation, recurrent- on pocket pill therapy 2. Bilateral leg edema - minimal 3. Morbid obesity with significant weight gain and prior bariatric surgery 4. Essential hypertension  Continue current meds - keep working on weight loss. Edema is pretty good. BP controlled. Repeat labs are pending. Few episodes of breakthrough afib. Still happy with pocket pill strategy. On xarelto for anticoagulation. No changes recommended. Follow-up annually.  COVID-19 Education: The signs and symptoms of COVID-19 were discussed with the patient and how to seek care for testing (follow up with PCP or arrange E-visit).  The importance of social distancing was discussed today.  Patient Risk:   After full review of this patients clinical status, I feel that they are at least moderate risk at this time.  Time:   Today, I have spent 25 minutes with the patient with telehealth technology discussing afib, hypertension, weight loss, edema     Medication Adjustments/Labs and  Tests Ordered: Current medicines are reviewed at length with the patient today.  Concerns regarding medicines are outlined above.   Tests Ordered: No orders of the defined types were placed in this encounter.   Medication Changes: No orders of the defined types were placed in this encounter.   Disposition:  in 1 year(s)  Pixie Casino, MD, [facility], [place] Director of the Advanced Lipid Disorders &  Cardiovascular Risk Reduction Clinic Diplomate of the American Board of Clinical Lipidology Attending Cardiologist  Direct Dial: [phone]  Fax: [phone]  Website:  [email or web address]  Pixie Casino, MD  05/17/2020 9:02 AM

## 2020-05-29 ENCOUNTER — Encounter: Payer: Self-pay | Admitting: Family Medicine

## 2020-05-30 MED ORDER — APIXABAN 5 MG PO TABS: 5.0000 mg | ORAL_TABLET | Freq: Two times a day (BID) | ORAL | 3 refills | Status: DC

## 2020-06-14 ENCOUNTER — Telehealth: Payer: Self-pay | Admitting: Family Medicine

## 2020-06-14 ENCOUNTER — Other Ambulatory Visit: Payer: Self-pay | Admitting: Family Medicine

## 2020-06-14 MED ORDER — DULOXETINE HCL 60 MG PO CPEP: 60.0000 mg | ORAL_CAPSULE | Freq: Every day | ORAL | 3 refills | Status: DC

## 2020-06-21 DIAGNOSIS — R310 Gross hematuria: Secondary | ICD-10-CM | POA: Diagnosis not present

## 2020-06-27 ENCOUNTER — Ambulatory Visit (INDEPENDENT_AMBULATORY_CARE_PROVIDER_SITE_OTHER): Payer: Medicare PPO | Admitting: Family Medicine

## 2020-06-29 ENCOUNTER — Ambulatory Visit (INDEPENDENT_AMBULATORY_CARE_PROVIDER_SITE_OTHER): Payer: Self-pay | Admitting: Family Medicine

## 2020-06-30 ENCOUNTER — Other Ambulatory Visit: Payer: Self-pay

## 2020-06-30 ENCOUNTER — Ambulatory Visit (INDEPENDENT_AMBULATORY_CARE_PROVIDER_SITE_OTHER): Payer: Medicare PPO | Admitting: Family Medicine

## 2020-06-30 ENCOUNTER — Encounter (INDEPENDENT_AMBULATORY_CARE_PROVIDER_SITE_OTHER): Payer: Self-pay | Admitting: Family Medicine

## 2020-06-30 VITALS — BP 133/78 | HR 78 | Temp 98.2°F | Ht 61.0 in | Wt 272.0 lb

## 2020-06-30 DIAGNOSIS — E88819 Insulin resistance, unspecified: Secondary | ICD-10-CM

## 2020-06-30 DIAGNOSIS — E7849 Other hyperlipidemia: Secondary | ICD-10-CM

## 2020-06-30 DIAGNOSIS — E8881 Metabolic syndrome: Secondary | ICD-10-CM | POA: Diagnosis not present

## 2020-06-30 DIAGNOSIS — E559 Vitamin D deficiency, unspecified: Secondary | ICD-10-CM | POA: Diagnosis not present

## 2020-06-30 DIAGNOSIS — R0602 Shortness of breath: Secondary | ICD-10-CM

## 2020-06-30 DIAGNOSIS — E034 Atrophy of thyroid (acquired): Secondary | ICD-10-CM

## 2020-06-30 DIAGNOSIS — E66813 Obesity, class 3: Secondary | ICD-10-CM

## 2020-06-30 DIAGNOSIS — Z6841 Body Mass Index (BMI) 40.0 and over, adult: Secondary | ICD-10-CM

## 2020-06-30 MED ORDER — VITAMIN D (ERGOCALCIFEROL) 1.25 MG (50000 UNIT) PO CAPS: 50000.0000 [IU] | ORAL_CAPSULE | ORAL | 0 refills | Status: DC

## 2020-07-01 LAB — COMPREHENSIVE METABOLIC PANEL
ALT: 17 IU/L (ref 0–32)
AST: 17 IU/L (ref 0–40)
Albumin/Globulin Ratio: 1.4 (ref 1.2–2.2)
Albumin: 4.4 g/dL (ref 3.8–4.8)
Alkaline Phosphatase: 69 IU/L (ref 44–121)
BUN/Creatinine Ratio: 22 (ref 12–28)
BUN: 20 mg/dL (ref 8–27)
Bilirubin Total: 0.4 mg/dL (ref 0.0–1.2)
CO2: 26 mmol/L (ref 20–29)
Calcium: 10.4 mg/dL — ABNORMAL HIGH (ref 8.7–10.3)
Chloride: 98 mmol/L (ref 96–106)
Creatinine, Ser: 0.91 mg/dL (ref 0.57–1.00)
Globulin, Total: 3.2 g/dL (ref 1.5–4.5)
Glucose: 99 mg/dL (ref 65–99)
Potassium: 4.3 mmol/L (ref 3.5–5.2)
Sodium: 143 mmol/L (ref 134–144)
Total Protein: 7.6 g/dL (ref 6.0–8.5)
eGFR: 70 mL/min/{1.73_m2} (ref >59)

## 2020-07-01 LAB — HEMOGLOBIN A1C
Est. average glucose Bld gHb Est-mCnc: 120 mg/dL
Hgb A1c MFr Bld: 5.8 % — ABNORMAL HIGH (ref 4.8–5.6)

## 2020-07-01 LAB — INSULIN, RANDOM: INSULIN: 13.9 u[IU]/mL (ref 2.6–24.9)

## 2020-07-01 LAB — LIPID PANEL WITH LDL/HDL RATIO
Cholesterol, Total: 206 mg/dL — ABNORMAL HIGH (ref 100–199)
HDL: 74 mg/dL (ref >39)
LDL Chol Calc (NIH): 115 mg/dL — ABNORMAL HIGH (ref 0–99)
LDL/HDL Ratio: 1.6 ratio (ref 0.0–3.2)
Triglycerides: 96 mg/dL (ref 0–149)
VLDL Cholesterol Cal: 17 mg/dL (ref 5–40)

## 2020-07-01 LAB — TSH: TSH: 2.63 u[IU]/mL (ref 0.450–4.500)

## 2020-07-01 LAB — VITAMIN D 25 HYDROXY (VIT D DEFICIENCY, FRACTURES): Vit D, 25-Hydroxy: 57.1 ng/mL (ref 30.0–100.0)

## 2020-07-05 DIAGNOSIS — Z1231 Encounter for screening mammogram for malignant neoplasm of breast: Secondary | ICD-10-CM | POA: Diagnosis not present

## 2020-07-05 DIAGNOSIS — N132 Hydronephrosis with renal and ureteral calculous obstruction: Secondary | ICD-10-CM | POA: Diagnosis not present

## 2020-07-05 DIAGNOSIS — D259 Leiomyoma of uterus, unspecified: Secondary | ICD-10-CM | POA: Diagnosis not present

## 2020-07-05 DIAGNOSIS — R310 Gross hematuria: Secondary | ICD-10-CM | POA: Diagnosis not present

## 2020-07-05 DIAGNOSIS — N281 Cyst of kidney, acquired: Secondary | ICD-10-CM | POA: Diagnosis not present

## 2020-07-05 DIAGNOSIS — N814 Uterovaginal prolapse, unspecified: Secondary | ICD-10-CM | POA: Diagnosis not present

## 2020-07-05 DIAGNOSIS — Z01411 Encounter for gynecological examination (general) (routine) with abnormal findings: Secondary | ICD-10-CM | POA: Diagnosis not present

## 2020-07-05 DIAGNOSIS — K449 Diaphragmatic hernia without obstruction or gangrene: Secondary | ICD-10-CM | POA: Diagnosis not present

## 2020-07-06 NOTE — Progress Notes (Signed)
Chief Complaint:   OBESITY Rebekah [name] is here to discuss her progress with her obesity treatment plan along with follow-up of her obesity related diagnoses. Rebekah [name] is on practicing portion control and making smarter food choices, such as increasing vegetables and decreasing simple carbohydrates and states she is following her eating plan approximately 90% of the time. Rebekah [name] states she is doing 0 minutes 0 times per week.  Today's visit was #: 52 Starting weight: 267 lbs Starting date: 01/01/2017 Today's weight: 272 lbs Today's date: 06/30/2020 Total lbs lost to date: 0 Total lbs lost since last in-office visit: 0  Interim History: Rebekah [name] is status post gastric sleeve in 2015. She has been working on weight loss, but she has been slowly gaining more weight. Her RMR was tested today and was 1521, which is lower than the expected 1750. She is eating less than 1,000 kcal per day, but she is still gaining weight. Her thyroid function is within normal limits. She notes she is getting poor sleep, especially due to pain.  Subjective:   1. Shortness of breath on exertion Mi notes no change in shortness of breath. Her repeat IC shows RMR has decreased.  2. Vitamin D deficiency Rebekah [name]'s last Vit D level was almost at goal with last lab draw. She is due to have labs rechecked.  3. Insulin resistance Rebekah [name] is working on diet and exercise, and she is due for labs.  4. Hypothyroidism due to acquired atrophy of thyroid Rebekah [name]'s last TSH was elevated. She states her primary care physician wants to recheck.  5. Other hyperlipidemia Edmund is not on statin, and she is due to have labs checked.  Assessment/Plan:   1. Shortness of breath on exertion Rebekah [name] is to continue to work on diet and weight loss, as well as exercise.  2. Vitamin D deficiency Low Vitamin D level contributes to fatigue and are associated with obesity, breast, and colon cancer. We will check labs today, and we will refill prescription  Vitamin D for 90 days with no refills. Rebekah [name] will follow-up for routine testing of Vitamin D, at least 2-3 times per year to avoid over-replacement.  - VITAMIN D 25 Hydroxy (Vit-D Deficiency, Fractures) - Vitamin D, Ergocalciferol, (DRISDOL) 1.25 MG (50000 UNIT) CAPS capsule; Take 1 capsule (50,000 Units total) by mouth every 7 (seven) days.  Dispense: 12 capsule; Refill: 0  3. Insulin resistance Rebekah [name] will continue to work on weight loss, exercise, and decreasing simple carbohydrates to help decrease the risk of diabetes. We will check labs today. Rebekah [name] agreed to follow-up with Rebekah [name] as directed to closely monitor her progress.  - Comprehensive metabolic panel - Insulin, random - Hemoglobin A1c  4. Hypothyroidism due to acquired atrophy of thyroid We will recheck [name]'s TSH, and will send the results to her primary care physician. Orders and follow up as documented in patient record.  - TSH  5. Other hyperlipidemia Cardiovascular risk and specific lipid/LDL goals reviewed. We discussed several lifestyle modifications today. We will check labs today. Rebekah [name] will continue to work on diet, exercise and weight loss efforts. Orders and follow up as documented in patient record.   - Lipid Panel With LDL/HDL Ratio  6. Obesity with current BMI 51.4 Orine is currently in the action stage of change. As such, her goal is to continue with weight loss efforts. She has agreed to keeping a food journal and adhering to recommended goals of 1200 calories and 75 grams of protein daily.   Rebekah [name]'s goal  is 1200 kcal per day and keep macros at approximately 33% each. She was encouraged to meet her protein goals with "real food" when possible.  Behavioral modification strategies: increasing lean protein intake, decreasing simple carbohydrates and decreasing liquid calories.  Rebekah [name] has agreed to follow-up with our clinic in 3 months. She was informed of the importance of frequent follow-up visits to maximize her success  with intensive lifestyle modifications for her multiple health conditions.   Rebekah [name] was informed we would discuss her lab results at her next visit unless there is a critical issue that needs to be addressed sooner. Rebekah [name] agreed to keep her next visit at the agreed upon time to discuss these results.  Objective:   Blood pressure 133/78, pulse 78, temperature 98.2 F (36.8 C), height 5\' 1"  (1.549 m), weight 272 lb (123.4 kg), SpO2 98 %. Body mass index is 51.39 kg/m.  General: Cooperative, alert, well developed, in no acute distress. HEENT: Conjunctivae and lids unremarkable. Cardiovascular: Regular rhythm.  Lungs: Normal work of breathing. Neurologic: No focal deficits.   Lab Results  Component Value Date   CREATININE 0.91 06/30/2020   BUN 20 06/30/2020   NA 143 06/30/2020   K 4.3 06/30/2020   CL 98 06/30/2020   CO2 26 06/30/2020   Lab Results  Component Value Date   ALT 17 06/30/2020   AST 17 06/30/2020   ALKPHOS 69 06/30/2020   BILITOT 0.4 06/30/2020   Lab Results  Component Value Date   HGBA1C 5.8 (H) 06/30/2020   HGBA1C 5.4 08/13/2019   HGBA1C 5.4 12/18/2018   HGBA1C 5.5 07/29/2018   HGBA1C 5.5 03/06/2018   Lab Results  Component Value Date   INSULIN 13.9 06/30/2020   INSULIN 5.0 08/13/2019   INSULIN 7.1 12/18/2018   INSULIN 9.8 07/29/2018   INSULIN 6.5 03/06/2018   Lab Results  Component Value Date   TSH 2.630 06/30/2020   Lab Results  Component Value Date   CHOL 206 (H) 06/30/2020   HDL 74 06/30/2020   LDLCALC 115 (H) 06/30/2020   TRIG 96 06/30/2020   CHOLHDL 2.8 03/06/2018   Lab Results  Component Value Date   WBC 8.4 05/10/2020   HGB 13.6 05/10/2020   HCT 41.5 05/10/2020   MCV 85 05/10/2020   PLT 202 05/10/2020   No results found for: IRON, TIBC, FERRITIN  Attestation Statements:   Reviewed by clinician on day of visit: allergies, medications, problem list, medical history, surgical history, family history, social history, and previous  encounter notes.  Time spent on visit including pre-visit chart review and post-visit care and charting was 45 minutes.    I, Trixie Dredge, am acting as transcriptionist for Dennard Nip, MD.  I have reviewed the above documentation for accuracy and completeness, and I agree with the above. -  Dennard Nip, MD

## 2020-07-20 ENCOUNTER — Other Ambulatory Visit: Payer: Self-pay | Admitting: Obstetrics and Gynecology

## 2020-07-20 DIAGNOSIS — R928 Other abnormal and inconclusive findings on diagnostic imaging of breast: Secondary | ICD-10-CM

## 2020-08-02 ENCOUNTER — Ambulatory Visit
Admission: RE | Admit: 2020-08-02 | Discharge: 2020-08-02 | Disposition: A | Discharge status: Home / Self Care | Payer: Medicare PPO | Source: Ambulatory Visit | Attending: Obstetrics and Gynecology | Admitting: Obstetrics and Gynecology

## 2020-08-02 ENCOUNTER — Other Ambulatory Visit: Payer: Self-pay

## 2020-08-02 ENCOUNTER — Ambulatory Visit: Payer: Medicare PPO

## 2020-08-02 DIAGNOSIS — R928 Other abnormal and inconclusive findings on diagnostic imaging of breast: Secondary | ICD-10-CM | POA: Diagnosis not present

## 2020-08-16 DIAGNOSIS — R310 Gross hematuria: Secondary | ICD-10-CM | POA: Diagnosis not present

## 2020-08-16 DIAGNOSIS — N200 Calculus of kidney: Secondary | ICD-10-CM | POA: Diagnosis not present

## 2020-08-19 ENCOUNTER — Telehealth: Payer: Self-pay | Admitting: Internal Medicine

## 2020-08-19 ENCOUNTER — Other Ambulatory Visit: Payer: Self-pay | Admitting: Urology

## 2020-08-19 NOTE — Telephone Encounter (Signed)
   [facility] Pre-operative Risk Assessment    Patient Name: Rebekah [name]  DOB: [birth year]  MRN: [id number]   HEARTCARE STAFF: - Please ensure there is not already an duplicate clearance open for this procedure. - Under Visit Info/Reason for Call, type in Other and utilize the format Clearance MM/DD/YY or Clearance TBD. Do not use dashes or single digits. - If request is for dental extraction, please clarify the # of teeth to be extracted.  Request for surgical clearance:  1. What type of surgery is being performed? Ureteroscopy    is this surgery scheduled? 09-29-20  2. What type of clearance is required (medical clearance vs. Pharmacy clearance to hold med vs. Both)? Both 3.  4. Are there any medications that need to be held prior to surgery and how long? Ekiquis   5. Practice name and name of physician performing surgery?  Dr Louis Meckel   6. What is the office phone number? [phone]   7.   What is the office fax number? [phone]  8.   Anesthesia type (None, local, MAC, general) ? General   [name] 08/19/2020, 9:36 AM  _________________________________________________________________   (provider comments below)

## 2020-08-20 ENCOUNTER — Other Ambulatory Visit (INDEPENDENT_AMBULATORY_CARE_PROVIDER_SITE_OTHER): Payer: Self-pay | Admitting: Family Medicine

## 2020-08-20 DIAGNOSIS — E559 Vitamin D deficiency, unspecified: Secondary | ICD-10-CM

## 2020-08-22 ENCOUNTER — Encounter: Payer: Self-pay | Admitting: Family Medicine

## 2020-08-22 NOTE — Telephone Encounter (Signed)
Patient with diagnosis of afib on Eliquis for anticoagulation.    Procedure: Ureteroscopy Date of procedure: 09/29/20  CHA2DS2-VASc Score = 3  This indicates a 3.2% annual risk of stroke. The patient's score is based upon: CHF History: No HTN History: Yes Diabetes History: No Stroke History: No Vascular Disease History: No Age Score: 1 Gender Score: 1     CrCl 75.9 ml/min Platelet count 202  Per office protocol, patient can hold Eliquis for 2 days prior to procedure.

## 2020-08-22 NOTE — Telephone Encounter (Signed)
   Name: Rebekah [name]  DOB: January 15, 1956  MRN: [id number]   Primary Cardiologist: None  Chart reviewed as part of pre-operative protocol coverage.   Left VM requesting call back for ongoing preop assessment.   Loel Dubonnet, NP 08/22/2020, 11:00 AM

## 2020-08-22 NOTE — Telephone Encounter (Signed)
Pt last seen by Dr. Beasley.  

## 2020-08-22 NOTE — Telephone Encounter (Signed)
   Name: Rebekah [name]  DOB: 09/14/1955  MRN: [id number]   Primary Cardiologist: None  Chart reviewed as part of pre-operative protocol coverage. Patient was contacted 08/22/2020 in reference to pre-operative risk assessment for pending surgery as outlined below.  Rebekah [name] was last seen on 05/17/20 by Dr. Debara Pickett.  Since that day, Rebekah [name] has done well.   Therefore, based on ACC/AHA guidelines, the patient would be at acceptable risk for the planned procedure without further cardiovascular testing.   Per pharmacy review she may hold Eliquis 2 days prior to the planned procedure and she verbalized understanding of these instructions.   The patient was advised that if she develops new symptoms prior to surgery to contact our office to arrange for a follow-up visit, and she verbalized understanding.  I will route this recommendation to the requesting party via Epic fax function and remove from pre-op pool. Please call with questions.  Loel Dubonnet, NP 08/22/2020, 11:58 AM

## 2020-08-22 NOTE — Telephone Encounter (Signed)
Patient I returning call.

## 2020-08-24 ENCOUNTER — Other Ambulatory Visit: Payer: Self-pay | Admitting: Family Medicine

## 2020-08-24 ENCOUNTER — Other Ambulatory Visit: Payer: Self-pay | Admitting: Internal Medicine

## 2020-08-24 ENCOUNTER — Encounter (INDEPENDENT_AMBULATORY_CARE_PROVIDER_SITE_OTHER): Payer: Self-pay | Admitting: Family Medicine

## 2020-08-24 DIAGNOSIS — E034 Atrophy of thyroid (acquired): Secondary | ICD-10-CM

## 2020-09-05 ENCOUNTER — Ambulatory Visit: Payer: Medicare PPO | Admitting: Family Medicine

## 2020-09-05 ENCOUNTER — Other Ambulatory Visit: Payer: Self-pay

## 2020-09-05 ENCOUNTER — Encounter: Payer: Self-pay | Admitting: Family Medicine

## 2020-09-05 DIAGNOSIS — M545 Low back pain, unspecified: Secondary | ICD-10-CM

## 2020-09-05 MED ORDER — TIZANIDINE HCL 4 MG PO TABS: 2.0000 mg | ORAL_TABLET | Freq: Four times a day (QID) | ORAL | 0 refills | Status: DC | PRN

## 2020-09-05 MED ORDER — PREDNISONE 10 MG (21) PO TBPK: ORAL_TABLET | ORAL | 0 refills | Status: DC

## 2020-09-05 NOTE — Progress Notes (Signed)
Telephone visit  Subjective: CC: back pain PCP: Janora Norlander, DO YQI:HKVQ Rebekah [name] is a 65 y.o. female calls for telephone consult today. Patient provides verbal consent for consult held via phone.  Due to COVID-19 pandemic this visit was conducted virtually. This visit type was conducted due to national recommendations for restrictions regarding the COVID-19 Pandemic (Rebekah.g. social distancing, sheltering in place) in an effort to limit this patient's exposure and mitigate transmission in our community. All issues noted in this document were discussed and addressed.  A physical exam was not performed with this format.   Location of patient: daughter's in [place] Location of provider: [facility] Others present for call: none  1. Low back pain Worse with standing.  Pain is described as a catch.  No radicular symptoms.  Cannot take NSAIDs due to chronic anticoagulation.  Using heating pads and massage.  Symptoms onset Saturday. Prednisone helps in past. No prednisone in >2 years.  Currently in [place] with her daughter.   ROS: Per HPI  Allergies  Allergen Reactions  . Beef-Derived Products Anaphylaxis    After tick bite, cannot eat beef, pork or lamb  . Lambs Quarters Anaphylaxis    After tick bite cannot eat beef, pork or lamb  . Mucinex [Guaifenesin Er] Anaphylaxis  . Pork-Derived Products Anaphylaxis    After tick bite cannot eat beef, pork or lamb  . Darvon [Propoxyphene] Other (See Comments)    Hallucinations   . Ace Inhibitors Swelling and Cough    Pedal Edema  . Ppd [Tuberculin Purified Protein Derivative]     Always has positive testing to PPD, do not use  . Hydromet [Hydrocodone Bit-Homatrop Mbr] Itching and Other (See Comments)    Severe stomach pain-face itches.   . Sulfonamide Derivatives Rash   Past Medical History:  Diagnosis Date  . Anxiety   . Arthritis   . Bilateral bunions   . Complication of anesthesia   . Difficulty sleeping    prior sleep study did not reveal sleep  apnea per patient  . DJD (degenerative joint disease)   . Dyspnea   . Dysrhythmia    a-fib  . Environmental allergies   . Food allergy   . Gallbladder problem   . GERD (gastroesophageal reflux disease)   . Heart murmur   . Hypertension    no meds after weight loss  . Hypothyroidism   . Incontinence of urine    at nite   . Joint pain   . Left atrial dilatation   . Left foot pain   . Leg edema   . Obesity    s/p gastric sleeve 12/2013 (previously weighed close to 400 lbs)  . Osteoarthritis   . Palpitations   . Paroxysmal atrial fibrillation (HCC)   . Pneumonia    hx of   . PONV (postoperative nausea and vomiting)   . Status post bilateral knee replacements   . Supplemental oxygen dependent    uses 2l/ at night, states HR goes low and O2 drops  . Tuberculosis    had 6 month of INH due to exposure   . Vaginal vault prolapse     Current Outpatient Medications:  .  apixaban (ELIQUIS) 5 MG TABS tablet, Take 1 tablet (5 mg total) by mouth 2 (two) times daily., Disp: 180 tablet, Rfl: 3 .  CALCIUM PO, Take 500 mg by mouth daily. , Disp: , Rfl:  .  cetirizine (ZYRTEC) 10 MG tablet, Take 10 mg by mouth daily., Disp: ,  Rfl:  .  Cholecalciferol (VITAMIN D) 50 MCG (2000 UT) CAPS, Take 1 capsule by mouth daily., Disp: , Rfl:  .  diltiazem (CARDIZEM CD) 120 MG 24 hr capsule, TAKE 1 CAPSULE BY MOUTH ONCE DAILY, Disp: 90 capsule, Rfl: 3 .  diltiazem (CARDIZEM) 30 MG tablet, TAKE ONE TO TWO TABLETS BY MOUTH EVERY 6 HOURS AS NEEDED FOR  FAST  HEART  RATES, Disp: 30 tablet, Rfl: 1 .  docusate sodium (COLACE) 100 MG capsule, daily as needed., Disp: , Rfl:  .  DULoxetine (CYMBALTA) 60 MG capsule, Take 1 capsule (60 mg total) by mouth daily., Disp: 90 capsule, Rfl: 3 .  EPINEPHrine (EPIPEN) 0.3 mg/0.3 mL IJ SOAJ injection, Inject 0.3 mLs (0.3 mg total) into the muscle once as needed (anaphylaxis)., Disp: 1 Device, Rfl: 6 .  flecainide (TAMBOCOR) 100 MG tablet, Take 3 tablets by mouth at onset of  fast heart rate as needed, Disp: 30 tablet, Rfl: 1 .  furosemide (LASIX) 20 MG tablet, TAKE 1 TABLET BY MOUTH ONCE DAILY, Disp: 90 tablet, Rfl: 3 .  levothyroxine (SYNTHROID) 50 MCG tablet, TAKE ONE TABLET BY MOUTH DAILY BEFORE BREAKFAST, Disp: 90 tablet, Rfl: 0 .  losartan (COZAAR) 50 MG tablet, Take 1 tablet (50 mg total) by mouth 2 (two) times daily., Disp: 180 tablet, Rfl: 3 .  Multiple Vitamins-Minerals (MULTIVITAMIN ADULT PO), Take 1 capsule by mouth 2 (two) times daily. Journey 3+3 Bariatric Multivitamin, Disp: , Rfl:  .  OXYGEN, Inhale 3 L/min into the lungs at bedtime. Continuously, Disp: , Rfl:  .  traMADol (ULTRAM) 50 MG tablet, Take 1-2 tablets (50-100 mg total) by mouth every 12 (twelve) hours as needed for severe pain., Disp: 60 tablet, Rfl: 2 .  Vitamin D, Ergocalciferol, (DRISDOL) 1.25 MG (50000 UNIT) CAPS capsule, Take 1 capsule (50,000 Units total) by mouth every 7 (seven) days., Disp: 12 capsule, Rfl: 0  Assessment/ Plan: 65 y.o. female   Acute bilateral low back pain without sciatica - Plan: tiZANidine (ZANAFLEX) 4 MG tablet, predniSONE (STERAPRED UNI-PAK 21 TAB) 10 MG (21) TBPK tablet, DISCONTINUED: tiZANidine (ZANAFLEX) 4 MG tablet  Muscle relaxer and prednisone sent.  Home care instructions reviewed.  Reasons for return discussed.  Follow-up as needed.   Start time: 1:18pm End time: 1:26pm  Total time spent on patient care (including telephone call/ virtual visit): 8 minutes  [place], [place] [phone]

## 2020-09-21 NOTE — Patient Instructions (Signed)
DUE TO COVID-19 ONLY ONE VISITOR IS ALLOWED TO COME WITH YOU AND STAY IN THE WAITING ROOM ONLY DURING PRE OP AND PROCEDURE DAY OF SURGERY. THE 1 VISITOR  MAY VISIT WITH YOU AFTER SURGERY IN YOUR PRIVATE ROOM DURING VISITING HOURS ONLY!                Rebekah [name]    Your procedure is scheduled on: 09/29/20   Report to [facility] Main  Entrance   Report to short stay at: 5:15 AM     Call this number if you have problems the morning of surgery [phone]    Remember: Do not eat food or drink liquids :After Midnight.   BRUSH YOUR TEETH MORNING OF SURGERY AND RINSE YOUR MOUTH OUT, NO CHEWING GUM CANDY OR MINTS.    Take these medicines the morning of surgery with A SIP OF WATER: duloxetine,cetirizine,diltiazem,levothyroxine.Tambocor as needed.                               You may not have any metal on your body including hair pins and              piercings  Do not wear jewelry, make-up, lotions, powders or perfumes, deodorant             Do not wear nail polish on your fingernails.  Do not shave  48 hours prior to surgery.    Do not bring valuables to the hospital. [place].  Contacts, dentures or bridgework may not be worn into surgery.  Leave suitcase in the car. After surgery it may be brought to your room.     Patients discharged the day of surgery will not be allowed to drive home. IF YOU ARE HAVING SURGERY AND GOING HOME THE SAME DAY, YOU MUST HAVE AN ADULT TO DRIVE YOU HOME AND BE WITH YOU FOR 24 HOURS. YOU MAY GO HOME BY TAXI OR UBER OR ORTHERWISE, BUT AN ADULT MUST ACCOMPANY YOU HOME AND STAY WITH YOU FOR 24 HOURS.  Name and phone number of your driver:  Special Instructions: N/A              Please read over the following fact sheets you were given: _____________________________________________________________________        [facility] - Preparing for Surgery Before surgery, you can play an important role.   Because skin is not sterile, your skin needs to be as free of germs as possible.  You can reduce the number of germs on your skin by washing with CHG (chlorahexidine gluconate) soap before surgery.  CHG is an antiseptic cleaner which kills germs and bonds with the skin to continue killing germs even after washing. Please DO NOT use if you have an allergy to CHG or antibacterial soaps.  If your skin becomes reddened/irritated stop using the CHG and inform your nurse when you arrive at Short Stay. Do not shave (including legs and underarms) for at least 48 hours prior to the first CHG shower.  You may shave your face/neck. Please follow these instructions carefully:  1.  Shower with CHG Soap the night before surgery and the  morning of Surgery.  2.  If you choose to wash your hair, wash your hair first as usual with your  normal  shampoo.  3.  After you shampoo, rinse your hair and body thoroughly to remove the  shampoo.                           4.  Use CHG as you would any other liquid soap.  You can apply chg directly  to the skin and wash                       Gently with a scrungie or clean washcloth.  5.  Apply the CHG Soap to your body ONLY FROM THE NECK DOWN.   Do not use on face/ open                           Wound or open sores. Avoid contact with eyes, ears mouth and genitals (private parts).                       Wash face,  Genitals (private parts) with your normal soap.             6.  Wash thoroughly, paying special attention to the area where your surgery  will be performed.  7.  Thoroughly rinse your body with warm water from the neck down.  8.  DO NOT shower/wash with your normal soap after using and rinsing off  the CHG Soap.                9.  Pat yourself dry with a clean towel.            10.  Wear clean pajamas.            11.  Place clean sheets on your bed the night of your first shower and do not  sleep with pets. Day of Surgery : Do not apply any lotions/deodorants the  morning of surgery.  Please wear clean clothes to the hospital/surgery center.  FAILURE TO FOLLOW THESE INSTRUCTIONS MAY RESULT IN THE CANCELLATION OF YOUR SURGERY PATIENT SIGNATURE_________________________________  NURSE SIGNATURE__________________________________  ________________________________________________________________________

## 2020-09-22 ENCOUNTER — Encounter (HOSPITAL_COMMUNITY): Payer: Self-pay

## 2020-09-22 ENCOUNTER — Encounter (HOSPITAL_COMMUNITY)
Admission: RE | Admit: 2020-09-22 | Discharge: 2020-09-22 | Disposition: A | Discharge status: Home / Self Care | Payer: Medicare PPO | Source: Ambulatory Visit | Attending: Urology | Admitting: Urology

## 2020-09-22 ENCOUNTER — Other Ambulatory Visit: Payer: Self-pay

## 2020-09-22 DIAGNOSIS — Z01818 Encounter for other preprocedural examination: Secondary | ICD-10-CM | POA: Insufficient documentation

## 2020-09-22 DIAGNOSIS — Z7901 Long term (current) use of anticoagulants: Secondary | ICD-10-CM | POA: Insufficient documentation

## 2020-09-22 DIAGNOSIS — N200 Calculus of kidney: Secondary | ICD-10-CM | POA: Diagnosis not present

## 2020-09-22 DIAGNOSIS — I480 Paroxysmal atrial fibrillation: Secondary | ICD-10-CM | POA: Diagnosis not present

## 2020-09-22 DIAGNOSIS — Z9981 Dependence on supplemental oxygen: Secondary | ICD-10-CM | POA: Insufficient documentation

## 2020-09-22 DIAGNOSIS — Z79899 Other long term (current) drug therapy: Secondary | ICD-10-CM | POA: Diagnosis not present

## 2020-09-22 DIAGNOSIS — N201 Calculus of ureter: Secondary | ICD-10-CM | POA: Diagnosis not present

## 2020-09-22 DIAGNOSIS — I10 Essential (primary) hypertension: Secondary | ICD-10-CM | POA: Diagnosis not present

## 2020-09-22 HISTORY — DX: Personal history of urinary calculi: Z87.442

## 2020-09-22 LAB — BASIC METABOLIC PANEL
Anion gap: 8 (ref 5–15)
BUN: 24 mg/dL — ABNORMAL HIGH (ref 8–23)
CO2: 30 mmol/L (ref 22–32)
Calcium: 9.9 mg/dL (ref 8.9–10.3)
Chloride: 102 mmol/L (ref 98–111)
Creatinine, Ser: 0.96 mg/dL (ref 0.44–1.00)
GFR, Estimated: >60 mL/min (ref >60)
Glucose, Bld: 100 mg/dL — ABNORMAL HIGH (ref 70–99)
Potassium: 4.2 mmol/L (ref 3.5–5.1)
Sodium: 140 mmol/L (ref 135–145)

## 2020-09-22 LAB — CBC
HCT: 42.8 % (ref 36.0–46.0)
Hemoglobin: 13 g/dL (ref 12.0–15.0)
MCH: 27.3 pg (ref 26.0–34.0)
MCHC: 30.4 g/dL (ref 30.0–36.0)
MCV: 89.9 fL (ref 80.0–100.0)
Platelets: 194 10*3/uL (ref 150–400)
RBC: 4.76 MIL/uL (ref 3.87–5.11)
RDW: 14.3 % (ref 11.5–15.5)
WBC: 8.6 10*3/uL (ref 4.0–10.5)
nRBC: 0 % (ref 0.0–0.2)

## 2020-09-22 NOTE — Progress Notes (Addendum)
COVID Vaccine Completed:Yes Date COVID Vaccine completed: 02/08/20 COVID vaccine manufacturer: Pfizer      PCP - Dr. Ronnie Doss. LOV: 09/05/20 Cardiologist - Dr. Charlynne Cousins. LOV: 05/17/20  Chest x-ray -  EKG -  Stress Test -  ECHO -  Cardiac Cath -  Pacemaker/ICD device last checked:  Sleep Study - Yes CPAP - NO  Fasting Blood Sugar -  Checks Blood Sugar _____ times a day  Blood Thinner Instructions: Eliquis will be held 2 days before surgery as per cardiologist instructions. Aspirin Instructions: Last Dose:  Anesthesia review: Hx: Dysrhythmias,Dyspnea,heart murmur,HTN,Afib,O2 @ night. BP 180/100, Levada Dy PA-C aware.  Patient denies shortness of breath, fever, cough and chest pain at PAT appointment   Patient verbalized understanding of instructions that were given to them at the PAT appointment. Patient was also instructed that they will need to review over the PAT instructions again at home before surgery.

## 2020-09-23 NOTE — Progress Notes (Signed)
Anesthesia Chart Review:   Case: [id number] Date/Time: 09/29/20 0715   Procedure: LEFT URETEROSCOPY/HOLMIUM LASER STONE REMOVAL LEFT /STENT PLACEMENT (Left)   Anesthesia type: General   Pre-op diagnosis: LEFT URETERAL PELVIC JUNCTION STONE   Location: [facility]   Surgeons: Ardis Hughs, MD       DISCUSSION: Pt is 65 years old with hx HTN (and white coat hypertension), PAF, heart murmur, uses 2L O2 at night  BP at pre-surgical testing 09/22/20 was high, initially 200s/120s.  Recheck BP ws 180/100. Pt reports she took her BP meds prior to PST appointment. Denies, CP, SOB, dizziness/syncope, headache. Pt reports she has white coat HTN and BP at home prior to PST appointment was 120s/80s.  She also reports she is very anxious about her upcoming procedure. Pt asked to closely monitor her BP at home and discuss with cardiologist if BP is consistently high at home.   Pt called morning of 09/23/20 to report her BP is now up at home, ranging 160-180/86-90.  Still asymptomatic. Pt feels increased BP is due to anxiety about OR. Pt has reached out to Dr. Lysbeth Penner office for guidance and will seek help if she develops symptoms (ex. CP, severe headache).    -Pt to stop eliquis 2 days before surgery   VS: BP (!) 180/100 Comment: manually  Pulse 72   Temp 37.1 C (Oral)   Ht 5\' 1"  (1.549 m)   Wt 129.7 kg   SpO2 96%   BMI 54.04 kg/m   PROVIDERS: - PCP is Janora Norlander, DO - Cardiologist is Lyman Bishop, MD. Last telephone office visit 05/17/20. Pt cleared for surgery by Laurann Montana, NP on 08/22/20   LABS: Labs reviewed: Acceptable for surgery. (all labs ordered are listed, but only abnormal results are displayed)  Labs Reviewed  BASIC METABOLIC PANEL - Abnormal; Notable for the following components:      Result Value   Glucose, Bld 100 (*)    BUN 24 (*)    All other components within normal limits  CBC    EKG 09/22/20: NSR. LVH   CV: Echo 10/21/18:  1. The  left ventricle has normal systolic function with an ejection fraction of 60-65%. The cavity size was normal. There is mildly increased left ventricular wall thickness. Left ventricular diastolic Doppler parameters are consistent with impaired relaxation. No evidence of left ventricular regional wall motion abnormalities.   2. No evidence of mitral valve stenosis. Trivial mitral regurgitation.   3. The aortic valve is tricuspid. Mild calcification of the aortic valve. Aortic valve regurgitation is trivial by color flow Doppler. No stenosis of the aortic valve.   4. The aortic root is normal in size and structure.   5. Left atrial size was mildly dilated.   6. The right ventricle has normal systolic function. The cavity was normal. There is no increase in right ventricular wall thickness.   7. Right atrial size was mildly dilated.   8. Normal IVC size, PA systolic pressure 27 mmHg.    Nuclear stress test 04/06/15:  The left ventricular ejection fraction is normal (55-65%). There was no ST segment deviation noted during stress. The study is normal. This is a low risk study. - Low risk lexiscan myocardial study demonstrating normal perfusion with mild apical thinning without scar or ischemia; normal LV function, EF 60% with normal wall motion.   Past Medical History:  Diagnosis Date   Anxiety    Arthritis    Bilateral  bunions    Complication of anesthesia    Difficulty sleeping    prior sleep study did not reveal sleep apnea per patient   DJD (degenerative joint disease)    Dyspnea    Dysrhythmia    a-fib   Environmental allergies    Food allergy    Gallbladder problem    GERD (gastroesophageal reflux disease)    Heart murmur    History of kidney stones    Hypertension    no meds after weight loss   Hypothyroidism    Incontinence of urine    at nite    Joint pain    Left atrial dilatation    Left foot pain    Leg edema    Obesity    s/p gastric sleeve 12/2013 (previously  weighed close to 400 lbs)   Osteoarthritis    Palpitations    Paroxysmal atrial fibrillation (HCC)    Pneumonia    hx of    PONV (postoperative nausea and vomiting)    Status post bilateral knee replacements    Supplemental oxygen dependent    uses 2l/[place] at night, states HR goes low and O2 drops   Tuberculosis    had 6 month of INH due to exposure    Vaginal vault prolapse     Past Surgical History:  Procedure Laterality Date   APPENDECTOMY     CHOLECYSTECTOMY     EYE SURGERY     03/2014 lens implant left lens    FOOT ARTHRODESIS Left 03/15/2016   Procedure: TALONAVICULAR AND SUBTALAR ARTHRODESIS;  Surgeon: Wylene Simmer, MD;  Location: [place];  Service: Orthopedics;  Laterality: Left;   GASTROC RECESSION EXTREMITY Left 03/15/2016   Procedure: LEFT GASTROC RECESSION;  Surgeon: Wylene Simmer, MD;  Location: [place];  Service: Orthopedics;  Laterality: Left;   JOINT REPLACEMENT  1999   L TOTAL KNEE   LAPAROSCOPIC GASTRIC SLEEVE RESECTION N/A 01/04/2014   Procedure: LAPAROSCOPIC GASTRIC SLEEVE RESECTION;  Surgeon: Greer Pickerel, MD;  Location[facility];  Service: General;  Laterality: N/A;   TONSILLECTOMY     TOTAL KNEE ARTHROPLASTY Right 05/17/2015   Procedure: RIGHT TOTAL KNEE ARTHROPLASTY;  Surgeon: Paralee Cancel, MD;  Location[facility];  Service: Orthopedics;  Laterality: Right;   TRANSTHORACIC ECHOCARDIOGRAM  11/2012   EF 60-65%, mod LVH & mod conc hypertrophy, grade 1 diastolic dysfunction; LA mildly dilated; RV systolic pressure increased; PA peak pressure 55mmHg   TUBAL LIGATION     UPPER GI ENDOSCOPY  01/04/2014   Procedure: UPPER GI ENDOSCOPY;  Surgeon: Greer Pickerel, MD;  Location: [facility];  Service: General;;    MEDICATIONS:  acetaminophen (TYLENOL) 500 MG tablet   apixaban (ELIQUIS) 5 MG TABS tablet   CALCIUM PO   cetirizine (ZYRTEC) 10 MG tablet   Chlorpheniramine-APAP (CORICIDIN) 2-325 MG TABS   Cholecalciferol (VITAMIN D) 50 MCG (2000 UT)  CAPS   diltiazem (CARDIZEM CD) 120 MG 24 hr capsule   diltiazem (CARDIZEM) 30 MG tablet   DULoxetine (CYMBALTA) 60 MG capsule   EPINEPHrine (EPIPEN) 0.3 mg/0.3 mL IJ SOAJ injection   flecainide (TAMBOCOR) 100 MG tablet   furosemide (LASIX) 20 MG tablet   levothyroxine (SYNTHROID) 50 MCG tablet   losartan (COZAAR) 50 MG tablet   Magnesium 250 MG TABS   Multiple Vitamins-Minerals (MULTIVITAMIN ADULT PO)   OXYGEN   predniSONE (STERAPRED UNI-PAK 21 TAB) 10 MG (21) TBPK tablet   tiZANidine (ZANAFLEX) 4 MG tablet   traMADol (  ULTRAM) 50 MG tablet   Vitamin D, Ergocalciferol, (DRISDOL) 1.25 MG (50000 UNIT) CAPS capsule   No current facility-administered medications for this encounter.   -Pt to stop eliquis 2 days before surgery   Willeen Cass, PhD, FNP-BC [facility]/Anesthesiology Phone: [phone] 09/23/2020 1:03 PM

## 2020-09-26 DIAGNOSIS — H35372 Puckering of macula, left eye: Secondary | ICD-10-CM | POA: Diagnosis not present

## 2020-09-26 DIAGNOSIS — Z961 Presence of intraocular lens: Secondary | ICD-10-CM | POA: Diagnosis not present

## 2020-09-26 DIAGNOSIS — H18513 Endothelial corneal dystrophy, bilateral: Secondary | ICD-10-CM | POA: Diagnosis not present

## 2020-09-28 MED ORDER — CEFAZOLIN IN SODIUM CHLORIDE 3-0.9 GM/100ML-% IV SOLN
3.0000 g | INTRAVENOUS | Status: AC
  Administered 2020-09-29: 3 g via INTRAVENOUS
  Filled 2020-09-28: qty 100

## 2020-09-28 NOTE — Anesthesia Preprocedure Evaluation (Addendum)
Anesthesia Evaluation  Patient identified by MRN, date of birth, ID band Patient awake    Reviewed: Allergy & Precautions, NPO status , Patient's Chart, lab work & pertinent test results  History of Anesthesia Complications (+) PONV  Airway Mallampati: II  TM Distance: >3 FB Neck ROM: Full    Dental no notable dental hx. (+) Teeth Intact, Dental Advisory Given   Pulmonary COPD (o2 at night),    Pulmonary exam normal breath sounds clear to auscultation       Cardiovascular hypertension, Pt. on medications Normal cardiovascular exam+ dysrhythmias Atrial Fibrillation  Rhythm:Irregular Rate:Tachycardia  7/20 ECHO 1. The left ventricle has normal systolic function with an ejection  fraction of 60-65%. The cavity size was normal. There is mildly increased  left ventricular wall thickness. Left ventricular diastolic Doppler  parameters are consistent with impaired  relaxation. No evidence of left ventricular regional wall motion  abnormalities.  2. No evidence of mitral valve stenosis. Trivial mitral regurgitation.  3. The aortic valve is tricuspid. Mild calcification of the aortic valve.  Aortic valve regurgitation is trivial by color flow Doppler. No stenosis  of the aortic valve.  4. The aortic root is normal in size and structure.  5. Left atrial size was mildly dilated.  6. The right ventricle has normal systolic function. The cavity was  normal. There is no increase in right ventricular wall thickness.  7. Right atrial size was mildly dilated.  8. Normal IVC size, PA systolic pressure 27 mmHg. v   Neuro/Psych Anxiety negative neurological ROS     GI/Hepatic   Endo/Other  Hypothyroidism   Renal/GU Lab Results      Component                Value               Date                      CREATININE               0.96                09/22/2020                BUN                      24 (H)              09/22/2020                 NA                       140                 09/22/2020                K                        4.2                 09/22/2020                CL                       102                 09/22/2020  CO2                      30                  09/22/2020                Musculoskeletal  (+) Arthritis ,   Abdominal (+) + obese,   Peds  Hematology Lab Results      Component                Value               Date                      WBC                      8.6                 09/22/2020                HGB                      13.0                09/22/2020                HCT                      42.8                09/22/2020                MCV                      89.9                09/22/2020                PLT                      194                 09/22/2020              Anesthesia Other Findings All See list  Reproductive/Obstetrics                          Anesthesia Physical Anesthesia Plan  ASA: 3  Anesthesia Plan: General   Post-op Pain Management:    Induction: Intravenous  PONV Risk Score and Plan: 4 or greater and Treatment may vary due to age or medical condition, Ondansetron, Midazolam, Dexamethasone and Amisulpride  Airway Management Planned: Oral ETT  Additional Equipment: None  Intra-op Plan:   Post-operative Plan: Extubation in OR  Informed Consent: I have reviewed the patients History and Physical, chart, labs and discussed the procedure including the risks, benefits and alternatives for the proposed anesthesia with the patient or authorized representative who has indicated his/her understanding and acceptance.     Dental advisory given  Plan Discussed with: CRNA and Anesthesiologist  Anesthesia Plan Comments: (Pt w Alpha Gal  No Gabapentin No Acetaminophen tablets (no surgifoam))       Anesthesia Quick Evaluation

## 2020-09-29 ENCOUNTER — Ambulatory Visit (HOSPITAL_COMMUNITY): Payer: Medicare PPO

## 2020-09-29 ENCOUNTER — Ambulatory Visit (HOSPITAL_COMMUNITY): Payer: Medicare PPO | Admitting: Anesthesiology

## 2020-09-29 ENCOUNTER — Ambulatory Visit (HOSPITAL_COMMUNITY): Payer: Medicare PPO | Admitting: Emergency Medicine

## 2020-09-29 ENCOUNTER — Ambulatory Visit (HOSPITAL_COMMUNITY)
Admission: RE | Admit: 2020-09-29 | Discharge: 2020-09-29 | Disposition: A | Discharge status: Home / Self Care | Payer: Medicare PPO | Attending: Urology | Admitting: Urology

## 2020-09-29 ENCOUNTER — Encounter (HOSPITAL_COMMUNITY): Payer: Self-pay | Admitting: Urology

## 2020-09-29 ENCOUNTER — Encounter (HOSPITAL_COMMUNITY)
Admission: RE | Disposition: A | Discharge status: Home / Self Care | Payer: Self-pay | Source: Home / Self Care | Attending: Urology

## 2020-09-29 DIAGNOSIS — K219 Gastro-esophageal reflux disease without esophagitis: Secondary | ICD-10-CM | POA: Diagnosis not present

## 2020-09-29 DIAGNOSIS — Z7901 Long term (current) use of anticoagulants: Secondary | ICD-10-CM | POA: Diagnosis not present

## 2020-09-29 DIAGNOSIS — R310 Gross hematuria: Secondary | ICD-10-CM | POA: Diagnosis not present

## 2020-09-29 DIAGNOSIS — Z887 Allergy status to serum and vaccine status: Secondary | ICD-10-CM | POA: Insufficient documentation

## 2020-09-29 DIAGNOSIS — Z79899 Other long term (current) drug therapy: Secondary | ICD-10-CM | POA: Insufficient documentation

## 2020-09-29 DIAGNOSIS — N201 Calculus of ureter: Secondary | ICD-10-CM | POA: Diagnosis not present

## 2020-09-29 DIAGNOSIS — I480 Paroxysmal atrial fibrillation: Secondary | ICD-10-CM | POA: Diagnosis not present

## 2020-09-29 DIAGNOSIS — I4891 Unspecified atrial fibrillation: Secondary | ICD-10-CM | POA: Insufficient documentation

## 2020-09-29 DIAGNOSIS — Z888 Allergy status to other drugs, medicaments and biological substances status: Secondary | ICD-10-CM | POA: Insufficient documentation

## 2020-09-29 DIAGNOSIS — N8189 Other female genital prolapse: Secondary | ICD-10-CM | POA: Insufficient documentation

## 2020-09-29 DIAGNOSIS — N393 Stress incontinence (female) (male): Secondary | ICD-10-CM | POA: Diagnosis not present

## 2020-09-29 DIAGNOSIS — N200 Calculus of kidney: Secondary | ICD-10-CM | POA: Diagnosis not present

## 2020-09-29 DIAGNOSIS — I10 Essential (primary) hypertension: Secondary | ICD-10-CM | POA: Diagnosis not present

## 2020-09-29 DIAGNOSIS — Z882 Allergy status to sulfonamides status: Secondary | ICD-10-CM | POA: Insufficient documentation

## 2020-09-29 DIAGNOSIS — Z91014 Allergy to mammalian meats: Secondary | ICD-10-CM | POA: Diagnosis not present

## 2020-09-29 DIAGNOSIS — Z79890 Hormone replacement therapy (postmenopausal): Secondary | ICD-10-CM | POA: Insufficient documentation

## 2020-09-29 HISTORY — PX: CYSTOSCOPY/URETEROSCOPY/HOLMIUM LASER/STENT PLACEMENT: SHX6546

## 2020-09-29 SURGERY — CYSTOSCOPY/URETEROSCOPY/HOLMIUM LASER/STENT PLACEMENT
Anesthesia: General | Site: Ureter | Laterality: Left

## 2020-09-29 MED ORDER — FENTANYL CITRATE (PF) 100 MCG/2ML IJ SOLN
INTRAMUSCULAR | Status: DC | PRN
  Administered 2020-09-29 (×2): 50 ug via INTRAVENOUS

## 2020-09-29 MED ORDER — SUGAMMADEX SODIUM 500 MG/5ML IV SOLN
INTRAVENOUS | Status: DC | PRN
  Administered 2020-09-29 (×2): 100 mg via INTRAVENOUS
  Administered 2020-09-29: 300 mg via INTRAVENOUS

## 2020-09-29 MED ORDER — PHENYLEPHRINE 40 MCG/ML (10ML) SYRINGE FOR IV PUSH (FOR BLOOD PRESSURE SUPPORT)
PREFILLED_SYRINGE | INTRAVENOUS | Status: DC | PRN
  Administered 2020-09-29: 120 ug via INTRAVENOUS
  Administered 2020-09-29: 160 ug via INTRAVENOUS
  Administered 2020-09-29 (×3): 120 ug via INTRAVENOUS

## 2020-09-29 MED ORDER — ROCURONIUM BROMIDE 10 MG/ML (PF) SYRINGE
PREFILLED_SYRINGE | INTRAVENOUS | Status: DC | PRN
  Administered 2020-09-29: 60 mg via INTRAVENOUS

## 2020-09-29 MED ORDER — MIDAZOLAM HCL 2 MG/2ML IJ SOLN
INTRAMUSCULAR | Status: AC
  Filled 2020-09-29: qty 2

## 2020-09-29 MED ORDER — LIDOCAINE HCL URETHRAL/MUCOSAL 2 % EX GEL
CUTANEOUS | Status: AC
  Filled 2020-09-29: qty 30

## 2020-09-29 MED ORDER — BELLADONNA ALKALOIDS-OPIUM 16.2-60 MG RE SUPP
RECTAL | Status: DC | PRN
  Administered 2020-09-29: 1 via RECTAL

## 2020-09-29 MED ORDER — PROPOFOL 10 MG/ML IV BOLUS
INTRAVENOUS | Status: AC
  Filled 2020-09-29: qty 20

## 2020-09-29 MED ORDER — ESMOLOL HCL 100 MG/10ML IV SOLN
INTRAVENOUS | Status: DC | PRN
  Administered 2020-09-29 (×3): 30 mg via INTRAVENOUS

## 2020-09-29 MED ORDER — BELLADONNA ALKALOIDS-OPIUM 16.2-30 MG RE SUPP
RECTAL | Status: AC
  Filled 2020-09-29: qty 1

## 2020-09-29 MED ORDER — TRAMADOL HCL 50 MG PO TABS: 50.0000 mg | ORAL_TABLET | Freq: Four times a day (QID) | ORAL | 2 refills | Status: DC | PRN

## 2020-09-29 MED ORDER — ROCURONIUM BROMIDE 10 MG/ML (PF) SYRINGE
PREFILLED_SYRINGE | INTRAVENOUS | Status: AC
  Filled 2020-09-29: qty 10

## 2020-09-29 MED ORDER — OXYCODONE HCL 5 MG/5ML PO SOLN: 5.0000 mg | Freq: Once | ORAL | Status: DC | PRN

## 2020-09-29 MED ORDER — LIDOCAINE HCL URETHRAL/MUCOSAL 2 % EX GEL
CUTANEOUS | Status: DC | PRN
  Administered 2020-09-29: 1

## 2020-09-29 MED ORDER — ORAL CARE MOUTH RINSE: 15.0000 mL | Freq: Once | OROMUCOSAL | Status: AC

## 2020-09-29 MED ORDER — LIDOCAINE 2% (20 MG/ML) 5 ML SYRINGE
INTRAMUSCULAR | Status: DC | PRN
  Administered 2020-09-29: 100 mg via INTRAVENOUS

## 2020-09-29 MED ORDER — DEXAMETHASONE SODIUM PHOSPHATE 10 MG/ML IJ SOLN
INTRAMUSCULAR | Status: AC
  Filled 2020-09-29: qty 1

## 2020-09-29 MED ORDER — ONDANSETRON HCL 4 MG/2ML IJ SOLN
INTRAMUSCULAR | Status: AC
  Filled 2020-09-29: qty 2

## 2020-09-29 MED ORDER — SODIUM CHLORIDE 0.9 % IR SOLN
Status: DC | PRN
  Administered 2020-09-29 (×2): 3000 mL

## 2020-09-29 MED ORDER — ELIQUIS 5 MG PO TABS
5.0000 mg | ORAL_TABLET | Freq: Two times a day (BID) | ORAL | 3 refills | Status: DC
Start: 2020-09-29 — End: 2021-12-04

## 2020-09-29 MED ORDER — PHENAZOPYRIDINE HCL 200 MG PO TABS: 200.0000 mg | ORAL_TABLET | Freq: Three times a day (TID) | ORAL | 0 refills | Status: DC | PRN

## 2020-09-29 MED ORDER — FENTANYL CITRATE (PF) 100 MCG/2ML IJ SOLN
INTRAMUSCULAR | Status: AC
  Filled 2020-09-29: qty 2

## 2020-09-29 MED ORDER — ONDANSETRON HCL 4 MG/2ML IJ SOLN: 4.0000 mg | Freq: Once | INTRAMUSCULAR | Status: DC | PRN

## 2020-09-29 MED ORDER — PHENYLEPHRINE HCL-NACL 20-0.9 MG/250ML-% IV SOLN
INTRAVENOUS | Status: DC | PRN
  Administered 2020-09-29: 50 ug/min via INTRAVENOUS

## 2020-09-29 MED ORDER — CIPROFLOXACIN HCL 500 MG PO TABS: 500.0000 mg | ORAL_TABLET | Freq: Once | ORAL | 0 refills | Status: AC

## 2020-09-29 MED ORDER — OXYCODONE HCL 5 MG PO TABS: 5.0000 mg | ORAL_TABLET | Freq: Once | ORAL | Status: DC | PRN

## 2020-09-29 MED ORDER — LACTATED RINGERS IV SOLN: INTRAVENOUS | Status: DC

## 2020-09-29 MED ORDER — SODIUM CHLORIDE 0.9 % IV SOLN
INTRAVENOUS | Status: DC | PRN
  Administered 2020-09-29: 10 mL

## 2020-09-29 MED ORDER — APREPITANT 40 MG PO CAPS
40.0000 mg | ORAL_CAPSULE | Freq: Once | ORAL | Status: AC
  Administered 2020-09-29: 40 mg via ORAL
  Filled 2020-09-29: qty 1

## 2020-09-29 MED ORDER — ACETAMINOPHEN 10 MG/ML IV SOLN: 1000.0000 mg | Freq: Once | INTRAVENOUS | Status: DC | PRN

## 2020-09-29 MED ORDER — MIDAZOLAM HCL 5 MG/5ML IJ SOLN
INTRAMUSCULAR | Status: DC | PRN
  Administered 2020-09-29: 2 mg via INTRAVENOUS

## 2020-09-29 MED ORDER — LIDOCAINE 2% (20 MG/ML) 5 ML SYRINGE
INTRAMUSCULAR | Status: AC
  Filled 2020-09-29: qty 5

## 2020-09-29 MED ORDER — DEXAMETHASONE SODIUM PHOSPHATE 10 MG/ML IJ SOLN
INTRAMUSCULAR | Status: DC | PRN
  Administered 2020-09-29: 5 mg via INTRAVENOUS

## 2020-09-29 MED ORDER — ESMOLOL HCL 100 MG/10ML IV SOLN
INTRAVENOUS | Status: AC
  Filled 2020-09-29: qty 10

## 2020-09-29 MED ORDER — HYDROMORPHONE HCL 1 MG/ML IJ SOLN: 0.2500 mg | INTRAMUSCULAR | Status: DC | PRN

## 2020-09-29 MED ORDER — SUGAMMADEX SODIUM 500 MG/5ML IV SOLN
INTRAVENOUS | Status: AC
  Filled 2020-09-29: qty 5

## 2020-09-29 MED ORDER — CHLORHEXIDINE GLUCONATE 0.12 % MT SOLN
15.0000 mL | Freq: Once | OROMUCOSAL | Status: AC
  Administered 2020-09-29: 15 mL via OROMUCOSAL

## 2020-09-29 MED ORDER — ONDANSETRON HCL 4 MG/2ML IJ SOLN
INTRAMUSCULAR | Status: DC | PRN
  Administered 2020-09-29: 4 mg via INTRAVENOUS

## 2020-09-29 MED ORDER — PROPOFOL 10 MG/ML IV BOLUS
INTRAVENOUS | Status: DC | PRN
  Administered 2020-09-29: 140 mg via INTRAVENOUS

## 2020-09-29 SURGICAL SUPPLY — 21 items
BAG URO CATCHER STRL LF (MISCELLANEOUS) ×2 IMPLANT
BASKET ZERO TIP NITINOL 2.4FR (BASKET) IMPLANT
BSKT STON RTRVL ZERO TP 2.4FR (BASKET)
CATH URET 5FR 28IN OPEN ENDED (CATHETERS) ×2 IMPLANT
CATH URET DUAL LUMEN 6-10FR 50 (CATHETERS) ×2 IMPLANT
CLOTH BEACON ORANGE TIMEOUT ST (SAFETY) ×2 IMPLANT
EXTRACTOR STONE 1.7FRX115CM (UROLOGICAL SUPPLIES) ×2 IMPLANT
GLOVE SURG ENC TEXT LTX SZ7.5 (GLOVE) ×2 IMPLANT
GOWN STRL REUS W/TWL XL LVL3 (GOWN DISPOSABLE) ×2 IMPLANT
GUIDEWIRE ANG ZIPWIRE 038X150 (WIRE) IMPLANT
GUIDEWIRE STR DUAL SENSOR (WIRE) ×4 IMPLANT
KIT TURNOVER KIT A (KITS) ×2 IMPLANT
MANIFOLD NEPTUNE II (INSTRUMENTS) ×2 IMPLANT
PACK CYSTO (CUSTOM PROCEDURE TRAY) ×2 IMPLANT
SHEATH URETERAL 12FRX28CM (UROLOGICAL SUPPLIES) IMPLANT
SHEATH URETERAL 12FRX35CM (MISCELLANEOUS) ×2 IMPLANT
STENT URET 6FRX24 CONTOUR (STENTS) ×2 IMPLANT
TRACTIP FLEXIVA PULS ID 200XHI (Laser) ×1 IMPLANT
TRACTIP FLEXIVA PULSE ID 200 (Laser) ×2
TUBING CONNECTING 10 (TUBING) ×2 IMPLANT
TUBING UROLOGY SET (TUBING) ×2 IMPLANT

## 2020-09-29 NOTE — Discharge Instructions (Addendum)
DISCHARGE INSTRUCTIONS FOR KIDNEY STONE/URETERAL STENT   MEDICATIONS:  1.  Resume all your other meds from home - except do not take any extra narcotic pain meds that you may have at home.  2. Pyridium is to help with the burning/stinging when you urinate. 3. Tramadol is for moderate/severe pain, otherwise taking upto 1000 mg every 6 hours of plainTylenol will help treat your pain.   4. Take Cipro one hour prior to removal of your stent.   ACTIVITY:  1. No strenuous activity x 1week  2. No driving while on narcotic pain medications  3. Drink plenty of water  4. Continue to walk at home - you can still get blood clots when you are at home, so keep active, but don't over do it.  5. May return to work/school tomorrow or when you feel ready   BATHING:  1. You can shower and we recommend daily showers  2. You have a string coming from your urethra: The stent string is attached to your ureteral stent. Do not pull on this.   SIGNS/SYMPTOMS TO CALL:  Please call us if you have a fever greater than 101.5, uncontrolled nausea/vomiting, uncontrolled pain, dizziness, unable to urinate, bloody urine, chest pain, shortness of breath, leg swelling, leg pain, redness around wound, drainage from wound, or any other concerns or questions.   You can reach [name] at [phone].   FOLLOW-UP:  1. You have an appointment in 6 weeks with a ultrasound of your kidneys prior.   2. You have a string attached to your stent, you may remove it on June 20th. To do this, pull the strings until the stents are completely removed. You may feel an odd sensation in your back.

## 2020-09-29 NOTE — Anesthesia Postprocedure Evaluation (Signed)
Anesthesia Post Note  Patient: Rebekah [name]  Procedure(s) Performed: CYSTOSCOPY WITH LEFT RETROGRADE PYELOGRAM AND LEFT URETEROSCOPY/HOLMIUM LASER STONE REMOVAL LEFT /STENT PLACEMENT (Left: Ureter)     Patient location during evaluation: PACU Anesthesia Type: General Level of consciousness: awake and alert Pain management: pain level controlled Vital Signs Assessment: post-procedure vital signs reviewed and stable Respiratory status: spontaneous breathing, nonlabored ventilation, respiratory function stable and patient connected to nasal cannula oxygen Cardiovascular status: blood pressure returned to baseline and stable Postop Assessment: no apparent nausea or vomiting Anesthetic complications: no   No notable events documented.  Last Vitals:  Vitals:   09/29/20 1000 09/29/20 1015  BP: (!) 150/96 (!) 139/98  Pulse: (!) 111 (!) 36  Resp: 16 18  Temp:    SpO2: 97% 90%    Last Pain:  Vitals:   09/29/20 1015  TempSrc:   PainSc: 0-No pain                 Barnet Glasgow

## 2020-09-29 NOTE — Progress Notes (Signed)
Dr . Valma Cava aware of patients afb rhythm with hr ranging from 140-165 ; pt denies any chest pain or sob ; pt states " I can feel some palpations " Dr Valma Cava now at bedside along with Rodessa ;

## 2020-09-29 NOTE — Interval H&P Note (Signed)
History and Physical Interval Note:  09/29/2020 7:27 AM  Rebekah [name]  has presented today for surgery, with the diagnosis of LEFT URETERAL PELVIC JUNCTION STONE.  The various methods of treatment have been discussed with the patient and family. After consideration of risks, benefits and other options for treatment, the patient has consented to  Procedure(s): LEFT URETEROSCOPY/HOLMIUM LASER STONE REMOVAL LEFT /STENT PLACEMENT (Left) as a surgical intervention.  The patient's history has been reviewed, patient examined, no change in status, stable for surgery.  I have reviewed the patient's chart and labs.  Questions were answered to the patient's satisfaction.     Ardis Hughs

## 2020-09-29 NOTE — Op Note (Signed)
Preoperative diagnosis:  Gross hematuria Large left UPJ stone  Postoperative diagnosis:  Same  Procedure: Cystoscopy, left retrograde pyelogram with interpretation Left ureteroscopy, laser lithotripsy and stone extraction Left ureteral stent placement  Surgeon: Ardis Hughs, MD  Anesthesia: General  Complications: None  Intraoperative findings:  1: The patient had a normal caliber ureter with no filling defects or abnormalities.  There is a large filling defect at the UPJ consistent with the patient's known stone.  Her renal pelvis was dilated and there was some mild blunting of her calyces. 2.:  The stone was largely dusted, so the larger fragments were removed via basket 3: A 24 cm time 6 French double-J stent was placed in the left ureter at the end of the case  EBL: Minimal  Specimens: None  Indication: Rebekah [name] is a 65 y.o. patient with gross hematuria and found to have a large UPJ stone.  After reviewing the management options for treatment, he elected to proceed with the above surgical procedure(s). We have discussed the potential benefits and risks of the procedure, side effects of the proposed treatment, the likelihood of the patient achieving the goals of the procedure, and any potential problems that might occur during the procedure or recuperation. Informed consent has been obtained.  Description of procedure:  The patient was taken to the operating room and general anesthesia was induced.  The patient was placed in the dorsal lithotomy position, prepped and draped in the usual sterile fashion, and preoperative antibiotics were administered. A preoperative time-out was performed.   21 French 0 degree cystoscope was gently passed through the patient's urethra and into the bladder under visual guidance.  A 5 French open-ended ureteral catheter was used to cannulate the patient's left ureter and a retrograde pyelogram was performed with the above findings.  I  exchanged the open-ended catheter for a sensor wire.  I then advanced a dual-lumen catheter over the wire and advanced a second wire up into the left renal pelvis.  I advanced the dual-lumen up to the UVJ for dilation purposes.  I then remove the catheter over the wire and advanced the 12/14 French medium size ureteral access sheath under fluoroscopic guidance.  This was easily advanced up to the proximal ureter.  The inner portion and the wire were both removed.  Using a flexible ureteroscope I encountered the stone in the patient's left UPJ.  I push the stone up into the upper mid pole calyx.  Here I dusted the stone with settings of 0.4 J and 50 Hz.  The larger stone fragments I then removed with an engage basket.  Any of the remaining fragments I dusted again with the laser.  At this time I reinjected contrast to ensure that there is no extravasation or any additional filling defects or stones.  Noting the patient to be otherwise stone free I slowly backed out the ureteroscope and the access she simultaneously inspecting the ureter ensuring there is no ureteral trauma.  I then advanced a 24 cm time 6 French double-J stent over the wire and into the left renal pelvis under fluoroscopic guidance.  Once it was noted to be well within the renal pelvis I advanced it up to the urethral meatus and with gentle traction removed the wire.  The stent was noted to pop into the patient's bladder which was confirmed with fluoroscopy.  The patient's bladder was drained.  Stent tether was then pulled through the urethra and attached to the inner portion of  her left thigh.  The patient was subsequently awoken and returned to the PACU in stable condition.  Disposition: The patient will be instructed to remove her stent on June 20.  She has follow-up with a renal ultrasound in 6 weeks.  Ardis Hughs, M.D.

## 2020-09-29 NOTE — Anesthesia Procedure Notes (Signed)
Procedure Name: Intubation Date/Time: 09/29/2020 7:44 AM Performed by: West Pugh, CRNA Pre-anesthesia Checklist: Patient identified, Emergency Drugs available, Suction available, Patient being monitored and Timeout performed Patient Re-evaluated:Patient Re-evaluated prior to induction Oxygen Delivery Method: Circle system utilized Preoxygenation: Pre-oxygenation with 100% oxygen Induction Type: IV induction Ventilation: Mask ventilation without difficulty Laryngoscope Size: Mac and 3 Grade View: Grade I Tube type: Oral Tube size: 7.0 mm Number of attempts: 1 Airway Equipment and Method: Stylet Placement Confirmation: ETT inserted through vocal cords under direct vision, positive ETCO2 and breath sounds checked- equal and bilateral Secured at: 22 cm Tube secured with: Tape Dental Injury: Teeth and Oropharynx as per pre-operative assessment

## 2020-09-29 NOTE — Transfer of Care (Signed)
Immediate Anesthesia Transfer of Care Note  Patient: Rebekah [name]  Procedure(s) Performed: CYSTOSCOPY WITH LEFT RETROGRADE PYELOGRAM AND LEFT URETEROSCOPY/HOLMIUM LASER STONE REMOVAL LEFT /STENT PLACEMENT (Left: Ureter)  Patient Location: PACU  Anesthesia Type:General  Level of Consciousness: awake, alert  and patient cooperative  Airway & Oxygen Therapy: Patient Spontanous Breathing and Patient connected to face mask oxygen  Post-op Assessment: Report given to RN and Post -op Vital signs reviewed and stable  Post vital signs: Reviewed and stable  Last Vitals:  Vitals Value Taken Time  BP 150/117 09/29/20 0938  Temp    Pulse 102 09/29/20 0945  Resp 22 09/29/20 0945  SpO2 90 % 09/29/20 0945  Vitals shown include unvalidated device data.  Last Pain:  Vitals:   09/29/20 0639  TempSrc:   PainSc: 0-No pain         Complications: No notable events documented.

## 2020-09-29 NOTE — H&P (Signed)
gross hematuria evaluation  HPI: Rebekah [name] is a 65 year-old female established patient who is here for further evaluation of gross hematuria.  She first noticed the blood approximately 12/16/2018. She has noticed blood a few times.   The patient has had an abdominal ct scan within the last year. She has not been told that she has blood in her urine prior to this episode.   The patient denies any progression of her voiding symptoms. The patient denies any new or recent onset of back/flank pain or suprapubic pain.   The patient does not have a history of recurrent UTIs. They do not have a history of kidney stones. She has not been exposed to occupational hazards that may increase their risks for developing cancer. The patient has no family history of GU malignancy. The patient is a not smoker.   Patient is anticoagulated for AFib. She was treated for 3 urinary tract infections with several rounds of antibiotics. She did have to positive cultures, although these results are not available to me. Last time she was treated with antibiotics her urine culture was negative. With treatment and stopping her anticoagulation the bleeding stops. She is not having any pain. She also is not having any worsening urinary tract symptoms. She does have stress urinary incontinence which is been going on for a long time. She also has pelvic organ prolapse for which she is wearing a pessary.   Interv: CT scan results reviewed today. Patient last had gross hematuria 3 weeks ago which comes and goes.     ALLERGIES: ACE Inhibitors - Other Reaction, Swelling Beef-derived Products - Anaphylaxis, Cannot eat beef, pork, lamb after tick bite Darvon CAPS - Other Reaction Hydromet - Itching Mucinex - Anaphylaxis PPD - Swelling, Redness, Itching, Other Reaction Sulfonamide Derivatives - Itching, Skin Rash    MEDICATIONS: Cymbalta 60 mg capsule,delayed release  Diltiazem 12Hr Er  Diltiazem 12Hr Er 120 mg capsule, extended  release 12 hr  Eliquis 5 mg tablet 1 tablet PO BID  Flecainide Acetate 100 mg tablet  Lasix 20 mg tablet  Levothyroxine 50 mcg capsule  Losartan Potassium 50 mg tablet 1 tablet PO BID  Synthroid 50 mcg tablet  Tramadol Hcl 50 mg tablet 1 tablet PO Daily PRN  Vitamin D2     GU PSH: Cystoscopy - 06/21/2020 Locm 300-399Mg /Ml Iodine,1Ml - 07/05/2020       PSH Notes: Foot surgery (left)- 2017, Bilateral TKR ([id number])   NON-GU PSH: Appendectomy (open) Cataract surgery, Bilateral, ?2020 Gastric sleeve, 2014 Remove Gallbladder Tonsillectomy, 1960     GU PMH: Gross hematuria - 07/05/2020, - 06/21/2020      PMH Notes: Pelvic prolapse- uses pessary   NON-GU PMH: Anxiety Arthritis Atrial Fibrillation Depression Hypertension    FAMILY HISTORY: 1 Daughter - Daughter 1 son - Son Breast Cancer - Mother Cervical Cancer - Mother Death - Father, Mother Diabetes - Father Myocardial Infarction - Father stroke - Brother Uterine Cancer - Mother    Notes: Mother deceased at age 9; multiple systems shutdown  Father deceased at age 68; diabetes, heart attack   SOCIAL HISTORY: Marital Status: Married Preferred Language: English; Ethnicity: Not Hispanic Or Latino; Race: White Current Smoking Status: Patient has never smoked.   Tobacco Use Assessment Completed: Used Tobacco in last 30 days? Has never drank.  Does not drink caffeine. 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     Notes: Caffeinated drinks: 1-2 glasses a week; not daily   REVIEW OF SYSTEMS:    GU  Review Female:   Patient denies frequent urination, hard to postpone urination, burning /pain with urination, get up at night to urinate, leakage of urine, stream starts and stops, trouble starting your stream, have to strain to urinate, and being pregnant.  Gastrointestinal (Upper):   Patient denies nausea, vomiting, and indigestion/ heartburn.  Gastrointestinal (Lower):   Patient denies diarrhea and constipation.   Constitutional:   Patient denies fever, night sweats, weight loss, and fatigue.  Skin:   Patient denies skin rash/ lesion and itching.  Eyes:   Patient denies blurred vision and double vision.  Ears/ Nose/ Throat:   Patient denies sinus problems and sore throat.  Hematologic/Lymphatic:   Patient denies swollen glands and easy bruising.  Cardiovascular:   Patient denies leg swelling and chest pains.  Respiratory:   Patient denies cough and shortness of breath.  Endocrine:   Patient denies excessive thirst.  Musculoskeletal:   Patient reports back pain. Patient denies joint pain.  Neurological:   Patient denies headaches and dizziness.  Psychologic:   Patient denies depression and anxiety.   VITAL SIGNS: None   Complexity of Data:  Source Of History:  Patient  Records Review:   Previous Doctor Records, Previous Patient Records  Urine Test Review:   Urinalysis  X-Ray Review: C.T. Abdomen/Pelvis: Reviewed Films. Discussed With Patient.     PROCEDURES:          Urinalysis w/Scope Dipstick Dipstick Cont'd Micro  Color: Amber Bilirubin: Neg mg/dL WBC/hpf: 6 - 10/hpf  Appearance: Cloudy Ketones: Neg mg/dL RBC/hpf: >60/hpf  Specific Gravity: 1.020 Blood: 3+ ery/uL Bacteria: NS (Not Seen)  pH: 6.0 Protein: 1+ mg/dL Cystals: NS (Not Seen)  Glucose: Neg mg/dL Urobilinogen: 0.2 mg/dL Casts: NS (Not Seen)    Nitrites: Neg Trichomonas: Not Present    Leukocyte Esterase: Trace leu/uL Mucous: Not Present      Epithelial Cells: 0 - 5/hpf      Yeast: NS (Not Seen)      Sperm: Not Present    ASSESSMENT:      ICD-10 Details  1 GU:   Gross hematuria - R31.0   2   Renal calculus - N20.0      PLAN:           Document Letter(s):  Created for Patient: Clinical Summary         Notes:   The patient's gross hematuria is almost certainly because of the large stone that she has at the left UPJ. Went through the treatment options with her and recommended that she strongly consider ureteroscopy for  stone removal. I went through the surgery with her in detail, including the postop stent that is required in a surgery like this. We discussed stent discomfort as well as they surgery recovery. The patient will need to stop her Eliquis, will reach out to cardiology to ensure this is safe. The patient is scheduled to go on a trip see her daughter, and will be returning the 1st week of June. I think it is appropriate to wait till afterwards as long she is asymptomatic with no evidence of infection. She will will need to have her urine culture prior to her surgery so that we can ensure that she is on appropriate antibiotics ahead of time given her history of infections in the past.

## 2020-09-29 NOTE — Interval H&P Note (Signed)
History and Physical Interval Note:  09/29/2020 7:40 AM  Rebekah [name]  has presented today for surgery, with the diagnosis of LEFT URETERAL PELVIC JUNCTION STONE.  The various methods of treatment have been discussed with the patient and family. After consideration of risks, benefits and other options for treatment, the patient has consented to  Procedure(s): LEFT URETEROSCOPY/HOLMIUM LASER STONE REMOVAL LEFT /STENT PLACEMENT (Left) as a surgical intervention.  The patient's history has been reviewed, patient examined, no change in status, stable for surgery.  I have reviewed the patient's chart and labs.  Questions were answered to the patient's satisfaction.     Ardis Hughs

## 2020-09-30 ENCOUNTER — Encounter (HOSPITAL_COMMUNITY): Payer: Self-pay | Admitting: Urology

## 2020-10-03 ENCOUNTER — Ambulatory Visit (INDEPENDENT_AMBULATORY_CARE_PROVIDER_SITE_OTHER): Payer: Medicare PPO | Admitting: Family Medicine

## 2020-10-05 LAB — CALCULI, WITH PHOTOGRAPH (CLINICAL LAB)
Calcium Oxalate Dihydrate: 40 %
Calcium Oxalate Monohydrate: 50 %
Hydroxyapatite: 10 %
Weight Calculi: 269 mg

## 2020-10-10 ENCOUNTER — Encounter (INDEPENDENT_AMBULATORY_CARE_PROVIDER_SITE_OTHER): Payer: Self-pay | Admitting: Family Medicine

## 2020-10-10 NOTE — Telephone Encounter (Signed)
Last OV with Dr. Beasley 

## 2020-10-20 ENCOUNTER — Ambulatory Visit (INDEPENDENT_AMBULATORY_CARE_PROVIDER_SITE_OTHER): Payer: Medicare PPO | Admitting: Family Medicine

## 2020-10-25 ENCOUNTER — Ambulatory Visit (INDEPENDENT_AMBULATORY_CARE_PROVIDER_SITE_OTHER): Payer: Medicare PPO | Admitting: Family Medicine

## 2020-11-07 ENCOUNTER — Ambulatory Visit: Payer: Medicare PPO | Admitting: Family Medicine

## 2020-11-10 DIAGNOSIS — R310 Gross hematuria: Secondary | ICD-10-CM | POA: Diagnosis not present

## 2020-11-10 DIAGNOSIS — N132 Hydronephrosis with renal and ureteral calculous obstruction: Secondary | ICD-10-CM | POA: Diagnosis not present

## 2020-11-14 ENCOUNTER — Other Ambulatory Visit: Payer: Self-pay

## 2020-11-14 ENCOUNTER — Encounter: Payer: Self-pay | Admitting: Family Medicine

## 2020-11-14 ENCOUNTER — Ambulatory Visit: Payer: Medicare PPO | Admitting: Family Medicine

## 2020-11-14 VITALS — BP 132/73 | HR 84 | Temp 97.9°F | Ht 61.0 in | Wt 283.0 lb

## 2020-11-14 DIAGNOSIS — Z23 Encounter for immunization: Secondary | ICD-10-CM

## 2020-11-14 DIAGNOSIS — Z6841 Body Mass Index (BMI) 40.0 and over, adult: Secondary | ICD-10-CM | POA: Diagnosis not present

## 2020-11-14 DIAGNOSIS — E559 Vitamin D deficiency, unspecified: Secondary | ICD-10-CM

## 2020-11-14 DIAGNOSIS — Z87442 Personal history of urinary calculi: Secondary | ICD-10-CM

## 2020-11-14 DIAGNOSIS — I480 Paroxysmal atrial fibrillation: Secondary | ICD-10-CM

## 2020-11-14 DIAGNOSIS — Z7901 Long term (current) use of anticoagulants: Secondary | ICD-10-CM | POA: Diagnosis not present

## 2020-11-14 DIAGNOSIS — R413 Other amnesia: Secondary | ICD-10-CM | POA: Diagnosis not present

## 2020-11-14 DIAGNOSIS — E034 Atrophy of thyroid (acquired): Secondary | ICD-10-CM | POA: Diagnosis not present

## 2020-11-14 DIAGNOSIS — Z1211 Encounter for screening for malignant neoplasm of colon: Secondary | ICD-10-CM | POA: Diagnosis not present

## 2020-11-14 LAB — BAYER DCA HB A1C WAIVED: HB A1C (BAYER DCA - WAIVED): 5.8 % (ref <7.0)

## 2020-11-14 MED ORDER — DULOXETINE HCL 30 MG PO CPEP: ORAL_CAPSULE | ORAL | 0 refills | Status: DC

## 2020-11-14 NOTE — Patient Instructions (Signed)

## 2020-11-14 NOTE — Progress Notes (Signed)
Subjective: CC: Chronic follow-up for hypothyroidism, atrial fibrillation, morbid obesity and vitamin D deficiency PCP: Janora Norlander, DO YFV:CBSW E Kawabata is a 65 y.o. female presenting to clinic today for:  1.  Hypothyroidism/morbid obesity/polyarthralgia Patient is compliant with her Synthroid 50 mcg daily.  No reports of tremor, change in voice or difficulty swallowing.  She continues to struggle with weight loss.  She has history of gastric bypass with Dr. Redmond Pulling several years ago and at her lowest got to about 176 pounds but really maintain somewhere around 190-210 for a while before regaining her weight.  She is on a fairly strict caloric intake diet with typically no more than 1000 cal/day even though she was recommended to increase to 1200 to 1500 cal/day by her bariatric specialist.  She no longer sees her bariatric specialist because she has been with her for several years and she is only within about 10 pounds of what she started out at.  She is asking for me to take care of of laboratory checkups.  She is never been treated with injection medication like Ozempic or Victoza.  Her daughter apparently took Lyles recently and had severe nausea from the medicine.  She admits that she is had some constipation since starting Cymbalta and has not really noticed a huge difference in her musculoskeletal pain.  She would like to discuss weaning from this today.  2.  Atrial fibrillation Patient is compliant with all medications.  No reports of GI bleeding or heart palpitations.  Is more active than she has been in the past.  Activities only limited by musculoskeletal pain at this point.  3.  Renal stone Patient had a renal stone recently.  She had follow-up with urology last Thursday and had an ultrasound.  There is question of stenosis and they recommended a follow-up soon to reevaluate this either by ultrasound or CT.  She has good urine output and has remained asymptomatic.  She is trying  to modify diet such that she is not eating high oxalate foods.  She finds this difficult because she is trying to not gain weight.  4.  Memory issues Patient reports sometimes feeling like she cannot find the words she is trying to say.  There is a family history in her maternal grandfather of dementia and what she thinks was dementia in the late life of her paternal grandmother who suffered from schizophrenia.  She does not recall getting memory tested during her annual exam for Medicare and would like to have this done today.   ROS: Per HPI  Allergies  Allergen Reactions   Beef-Derived Products Anaphylaxis    After tick bite, cannot eat beef, pork or lamb   Lambs Quarters Anaphylaxis    After tick bite cannot eat beef, pork or lamb   Mucinex [Guaifenesin Er] Anaphylaxis   Pork-Derived Products Anaphylaxis    After tick bite cannot eat beef, pork or lamb   Darvon [Propoxyphene] Other (See Comments)    Hallucinations    Ace Inhibitors Swelling and Cough    Pedal Edema   Ppd [Tuberculin Purified Protein Derivative]     Always has positive testing to PPD, do not use   Hydromet [Hydrocodone Bit-Homatrop Mbr] Itching and Other (See Comments)    Severe stomach pain-face itches.    Sulfonamide Derivatives Rash   Past Medical History:  Diagnosis Date   Anxiety    Arthritis    Bilateral bunions    Complication of anesthesia    Difficulty  sleeping    prior sleep study did not reveal sleep apnea per patient   DJD (degenerative joint disease)    Dyspnea    Dysrhythmia    a-fib   Environmental allergies    Food allergy    Gallbladder problem    GERD (gastroesophageal reflux disease)    Heart murmur    History of kidney stones    Hypertension    no meds after weight loss   Hypothyroidism    Incontinence of urine    at nite    Joint pain    Left atrial dilatation    Left foot pain    Leg edema    Obesity    s/p gastric sleeve 12/2013 (previously weighed close to 400 lbs)    Osteoarthritis    Palpitations    Paroxysmal atrial fibrillation (HCC)    Pneumonia    hx of    PONV (postoperative nausea and vomiting)    Status post bilateral knee replacements    Supplemental oxygen dependent    uses 2l/[place] at night, states HR goes low and O2 drops   Tuberculosis    had 6 month of INH due to exposure    Vaginal vault prolapse     Current Outpatient Medications:    acetaminophen (TYLENOL) 500 MG tablet, Take 500-1,000 mg by mouth every 6 (six) hours as needed for moderate pain or headache., Disp: , Rfl:    apixaban (ELIQUIS) 5 MG TABS tablet, Take 1 tablet (5 mg total) by mouth 2 (two) times daily. Resume in 48 hours or when no longer having blood in the urine, Disp: 180 tablet, Rfl: 3   CALCIUM PO, Take 500 mg by mouth daily. , Disp: , Rfl:    cetirizine (ZYRTEC) 10 MG tablet, Take 10 mg by mouth daily., Disp: , Rfl:    Chlorpheniramine-APAP (CORICIDIN) 2-325 MG TABS, Take 1 tablet by mouth 3 (three) times daily., Disp: , Rfl:    Cholecalciferol (VITAMIN D) 50 MCG (2000 UT) CAPS, Take 2,000 Units by mouth daily., Disp: , Rfl:    diltiazem (CARDIZEM CD) 120 MG 24 hr capsule, TAKE 1 CAPSULE BY MOUTH ONCE DAILY (Patient taking differently: Take 120 mg by mouth daily.), Disp: 90 capsule, Rfl: 3   diltiazem (CARDIZEM) 30 MG tablet, TAKE ONE TO TWO TABLETS BY MOUTH EVERY 6 HOURS AS NEEDED FOR  FAST  HEART  RATES, Disp: 30 tablet, Rfl: 1   DULoxetine (CYMBALTA) 60 MG capsule, Take 1 capsule (60 mg total) by mouth daily., Disp: 90 capsule, Rfl: 3   EPINEPHrine (EPIPEN) 0.3 mg/0.3 mL IJ SOAJ injection, Inject 0.3 mLs (0.3 mg total) into the muscle once as needed (anaphylaxis)., Disp: 1 Device, Rfl: 6   flecainide (TAMBOCOR) 100 MG tablet, Take 3 tablets by mouth at onset of fast heart rate as needed (Patient taking differently: Take 300 mg by mouth daily as needed (onset of afib).), Disp: 30 tablet, Rfl: 1   furosemide (LASIX) 20 MG tablet, TAKE 1 TABLET BY MOUTH ONCE DAILY  (Patient taking differently: Take 20 mg by mouth daily.), Disp: 90 tablet, Rfl: 3   levothyroxine (SYNTHROID) 50 MCG tablet, TAKE ONE TABLET BY MOUTH DAILY BEFORE BREAKFAST (Patient taking differently: Take 50 mcg by mouth daily before breakfast.), Disp: 90 tablet, Rfl: 0   losartan (COZAAR) 50 MG tablet, Take 1 tablet (50 mg total) by mouth 2 (two) times daily., Disp: 180 tablet, Rfl: 3   Magnesium 250 MG TABS, Take 250 mg by mouth  daily., Disp: , Rfl:    Multiple Vitamins-Minerals (MULTIVITAMIN ADULT PO), Take 1 capsule by mouth daily., Disp: , Rfl:    OXYGEN, Inhale 3 L/min into the lungs at bedtime. Continuously, Disp: , Rfl:    phenazopyridine (PYRIDIUM) 200 MG tablet, Take 1 tablet (200 mg total) by mouth 3 (three) times daily as needed for pain., Disp: 10 tablet, Rfl: 0   traMADol (ULTRAM) 50 MG tablet, Take 1-2 tablets (50-100 mg total) by mouth every 6 (six) hours as needed for severe pain., Disp: 15 tablet, Rfl: 2   Vitamin D, Ergocalciferol, (DRISDOL) 1.25 MG (50000 UNIT) CAPS capsule, Take 1 capsule (50,000 Units total) by mouth every 7 (seven) days., Disp: 12 capsule, Rfl: 0 Social History   Socioeconomic History   Marital status: Married    Spouse name: Lynesha Bango   Number of children: 2   Years of education: Not on file   Highest education level: Bachelor's degree (e.g., BA, AB, BS)  Occupational History   Occupation: special Public relations account executive: OTHER    Comment: Diplomatic Services operational officer. 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, museum   Social Determinants of Health   Financial Resource Strain: Not on file   Food Insecurity: Not on file  Transportation Needs: No Transportation Needs   Lack of Transportation (Medical): No   Lack of Transportation (Non-Medical): No  Physical Activity: Inactive   Days of Exercise per Week: 0 days   Minutes of Exercise per Session: 0 min  Stress: Not on file  Social Connections: Socially Integrated   Frequency of Communication with Friends and Family: More than three times a week   Frequency of Social Gatherings with Friends and Family: More than three times a week   Attends Religious Services: More than 4 times per year   Active Member of [employer] or Organizations: Yes   Attends Archivist Meetings: 1 to 4 times per year   Marital Status: Married  Human resources officer Violence: Not on file   Family History  Problem Relation Age of Onset   Diabetes Father    Hyperlipidemia Father    Hypertension Father    Heart disease Father    Sudden death Father    Anxiety disorder Father    Obesity Father    Uterine cancer Mother    Cervical cancer Mother    Breast cancer Mother    Cancer Mother        cervical and brreast cancer   Hypertension Mother    Hyperlipidemia Mother    Obesity Mother    Diabetes Maternal Grandmother     Objective: Office vital signs reviewed. BP 132/73 Comment: home BP  Pulse 84   Temp 97.9 F (36.6 C)   Ht 5' 1"  (1.549 m)   Wt 283 lb (128.4 kg)   SpO2 95%   BMI 53.47 kg/m   Physical Examination:  General: Awake, alert, morbid obesity, No acute distress HEENT: Normal; sclera white.  No exophthalmos Cardio: Irregularly irregular with rate control, S1S2 heard, no murmurs appreciated  Pulm: clear to auscultation bilaterally, no wheezes, rhonchi or rales; normal work of breathing on room air MSK: Ambulating independently Neuro: See MMSE  MMSE - Mini Mental State Exam 11/14/2020 02/17/2020  Orientation to time 5 5  Orientation to Place 5 5  Registration 0 3  Attention/ Calculation 2 5  Recall 3 3  Language- name 2  objects 2 2  Language- repeat 1 1  Language- follow 3 step command 3 3  Language- read & follow direction 1 1  Write a sentence 1 1  Copy design 1 1  Total score 24 30    Assessment/ Plan: 65 y.o. female   Hypothyroidism due to acquired atrophy of thyroid - Plan: TSH, T4, Free  Vitamin D deficiency - Plan: VITAMIN D 25 Hydroxy (Vit-D Deficiency, Fractures)  Paroxysmal A-fib (HCC) - Plan: CMP14+EGFR  Chronic anticoagulation - Plan: CMP14+EGFR  Class 3 severe obesity with serious comorbidity and body mass index (BMI) of 45.0 to 49.9 in adult, unspecified obesity type ([place]) - Plan: CMP14+EGFR, Bayer DCA Hb A1c Waived  History of renal stone  Memory change  Continues to struggle with weight this seems to be separate from her thyroid.  Check TSH, free T4  Check vitamin D level given history of vitamin D deficiency she is compliant with supplement.  Rate controlled.  Atrial fibrillation noted at rhythm today.  Does not seem to be having any adverse side effects of her chronic anticoagulation  Check A1c, CMP.  I am going to see if I can reach out to one of the [facility] MD consultants about her.  We discussed briefly consideration for evaluation at an academic center.  We discussed potentially initiating something like Saxenda but I would proceed very cautiously given history of constipation and bariatric surgery.  We will see if this is appropriate for this patient  Continue to work with urology as scheduled.  Check renal function as above  MMSE DID demonstrate mild evidence of dementia today.  Offered referral to neurology for further testing but patient wishes to come off of Cymbalta first to see if this may be impacting cognition.  I think this is reasonable.  No orders of the defined types were placed in this encounter.  No orders of the defined types were placed in this encounter.    Janora Norlander, DO [place] [phone]

## 2020-11-15 ENCOUNTER — Other Ambulatory Visit: Payer: Self-pay | Admitting: Family Medicine

## 2020-11-15 ENCOUNTER — Encounter: Payer: Self-pay | Admitting: Family Medicine

## 2020-11-15 LAB — CMP14+EGFR
ALT: 18 IU/L (ref 0–32)
AST: 18 IU/L (ref 0–40)
Albumin/Globulin Ratio: 1.6 (ref 1.2–2.2)
Albumin: 4.6 g/dL (ref 3.8–4.8)
Alkaline Phosphatase: 79 IU/L (ref 44–121)
BUN/Creatinine Ratio: 24 (ref 12–28)
BUN: 21 mg/dL (ref 8–27)
Bilirubin Total: 0.4 mg/dL (ref 0.0–1.2)
CO2: 28 mmol/L (ref 20–29)
Calcium: 10.4 mg/dL — ABNORMAL HIGH (ref 8.7–10.3)
Chloride: 99 mmol/L (ref 96–106)
Creatinine, Ser: 0.88 mg/dL (ref 0.57–1.00)
Globulin, Total: 2.9 g/dL (ref 1.5–4.5)
Glucose: 99 mg/dL (ref 65–99)
Potassium: 5 mmol/L (ref 3.5–5.2)
Sodium: 141 mmol/L (ref 134–144)
Total Protein: 7.5 g/dL (ref 6.0–8.5)
eGFR: 72 mL/min/{1.73_m2} (ref >59)

## 2020-11-15 LAB — T4, FREE: Free T4: 1.25 ng/dL (ref 0.82–1.77)

## 2020-11-15 LAB — TSH: TSH: 2.15 u[IU]/mL (ref 0.450–4.500)

## 2020-11-15 LAB — VITAMIN D 25 HYDROXY (VIT D DEFICIENCY, FRACTURES): Vit D, 25-Hydroxy: 62 ng/mL (ref 30.0–100.0)

## 2020-11-15 MED ORDER — DEXAMETHASONE 1 MG PO TABS: ORAL_TABLET | ORAL | 0 refills | Status: DC

## 2020-11-16 ENCOUNTER — Other Ambulatory Visit: Payer: Self-pay

## 2020-11-16 ENCOUNTER — Other Ambulatory Visit: Payer: Self-pay | Admitting: Family Medicine

## 2020-11-16 ENCOUNTER — Other Ambulatory Visit: Payer: Medicare PPO

## 2020-11-16 DIAGNOSIS — Z6841 Body Mass Index (BMI) 40.0 and over, adult: Secondary | ICD-10-CM | POA: Diagnosis not present

## 2020-11-16 NOTE — Progress Notes (Signed)
Arrives today to perform stimulation test. Cortisol level ordered.

## 2020-11-17 ENCOUNTER — Other Ambulatory Visit (INDEPENDENT_AMBULATORY_CARE_PROVIDER_SITE_OTHER): Payer: Self-pay | Admitting: Family Medicine

## 2020-11-17 DIAGNOSIS — E559 Vitamin D deficiency, unspecified: Secondary | ICD-10-CM

## 2020-11-17 LAB — CORTISOL-AM, BLOOD: Cortisol - AM: 1.1 ug/dL — ABNORMAL LOW (ref 6.2–19.4)

## 2020-11-18 ENCOUNTER — Encounter: Payer: Self-pay | Admitting: Family Medicine

## 2020-12-15 DIAGNOSIS — N132 Hydronephrosis with renal and ureteral calculous obstruction: Secondary | ICD-10-CM | POA: Diagnosis not present

## 2020-12-15 DIAGNOSIS — R351 Nocturia: Secondary | ICD-10-CM | POA: Diagnosis not present

## 2020-12-15 DIAGNOSIS — R310 Gross hematuria: Secondary | ICD-10-CM | POA: Diagnosis not present

## 2020-12-15 DIAGNOSIS — R3915 Urgency of urination: Secondary | ICD-10-CM | POA: Diagnosis not present

## 2020-12-18 ENCOUNTER — Encounter: Payer: Self-pay | Admitting: Family Medicine

## 2020-12-20 ENCOUNTER — Encounter: Payer: Self-pay | Admitting: Family Medicine

## 2020-12-20 ENCOUNTER — Ambulatory Visit: Payer: Medicare PPO | Admitting: Pharmacist

## 2020-12-20 ENCOUNTER — Ambulatory Visit: Payer: Medicare PPO | Admitting: Family Medicine

## 2020-12-20 DIAGNOSIS — J988 Other specified respiratory disorders: Secondary | ICD-10-CM

## 2020-12-20 DIAGNOSIS — R059 Cough, unspecified: Secondary | ICD-10-CM

## 2020-12-20 DIAGNOSIS — B9689 Other specified bacterial agents as the cause of diseases classified elsewhere: Secondary | ICD-10-CM

## 2020-12-20 LAB — VERITOR FLU A/B WAIVED
Influenza A: NEGATIVE
Influenza B: NEGATIVE

## 2020-12-20 MED ORDER — METHYLPREDNISOLONE 4 MG PO TBPK
ORAL_TABLET | ORAL | 0 refills | Status: DC
Start: 2020-12-20 — End: 2021-02-08

## 2020-12-20 MED ORDER — AMOXICILLIN-POT CLAVULANATE 875-125 MG PO TABS
1.0000 | ORAL_TABLET | Freq: Two times a day (BID) | ORAL | 0 refills | Status: AC
Start: 2020-12-20 — End: 2020-12-27

## 2020-12-20 MED ORDER — ALBUTEROL SULFATE HFA 108 (90 BASE) MCG/ACT IN AERS
2.0000 | INHALATION_SPRAY | Freq: Four times a day (QID) | RESPIRATORY_TRACT | 2 refills | Status: DC | PRN
Start: 2020-12-20 — End: 2021-08-21

## 2020-12-20 NOTE — Progress Notes (Signed)
Virtual Visit via Telephone Note  I connected with Rebekah [name] on 12/20/20 at 9:07 AM by telephone and verified that I am speaking with the correct person using two identifiers. Rebekah [name] is currently located at home and nobody is currently with her during this visit. The provider, Loman Brooklyn, FNP is located in their office at time of visit.  I discussed the limitations, risks, security and privacy concerns of performing an evaluation and management service by telephone and the availability of in person appointments. I also discussed with the patient that there may be a patient responsible charge related to this service. The patient expressed understanding and agreed to proceed.  Subjective: PCP: Janora Norlander, DO  Chief Complaint  Patient presents with   Cough   Patient complains of cough, head congestion, fever, postnasal drainage, and wheezing. Onset of symptoms was 1 week ago, gradually worsening since that time. She is drinking plenty of fluids and urinating per her usual. Evaluation to date: at home COVID test negative. Treatment to date:  Coricidin HBP .  She does not smoke.    ROS: Per HPI  Current Outpatient Medications:    acetaminophen (TYLENOL) 500 MG tablet, Take 500-1,000 mg by mouth every 6 (six) hours as needed for moderate pain or headache., Disp: , Rfl:    apixaban (ELIQUIS) 5 MG TABS tablet, Take 1 tablet (5 mg total) by mouth 2 (two) times daily. Resume in 48 hours or when no longer having blood in the urine, Disp: 180 tablet, Rfl: 3   CALCIUM PO, Take 500 mg by mouth daily. , Disp: , Rfl:    cetirizine (ZYRTEC) 10 MG tablet, Take 10 mg by mouth daily., Disp: , Rfl:    Chlorpheniramine-APAP (CORICIDIN) 2-325 MG TABS, Take 1 tablet by mouth 3 (three) times daily., Disp: , Rfl:    Cholecalciferol (VITAMIN D) 50 MCG (2000 UT) CAPS, Take 2,000 Units by mouth daily., Disp: , Rfl:    diltiazem (CARDIZEM CD) 120 MG 24 hr capsule, TAKE 1 CAPSULE BY MOUTH ONCE  DAILY (Patient taking differently: Take 120 mg by mouth daily.), Disp: 90 capsule, Rfl: 3   diltiazem (CARDIZEM) 30 MG tablet, TAKE ONE TO TWO TABLETS BY MOUTH EVERY 6 HOURS AS NEEDED FOR  FAST  HEART  RATES, Disp: 30 tablet, Rfl: 1   DULoxetine (CYMBALTA) 30 MG capsule, Take 1 capsule (30 mg total) by mouth daily for 28 days, THEN 1 capsule (30 mg total) every other day for 28 days. Then stop., Disp: 42 capsule, Rfl: 0   EPINEPHrine (EPIPEN) 0.3 mg/0.3 mL IJ SOAJ injection, Inject 0.3 mLs (0.3 mg total) into the muscle once as needed (anaphylaxis)., Disp: 1 Device, Rfl: 6   flecainide (TAMBOCOR) 100 MG tablet, Take 3 tablets by mouth at onset of fast heart rate as needed (Patient taking differently: Take 300 mg by mouth daily as needed (onset of afib).), Disp: 30 tablet, Rfl: 1   furosemide (LASIX) 20 MG tablet, TAKE 1 TABLET BY MOUTH ONCE DAILY (Patient taking differently: Take 20 mg by mouth daily.), Disp: 90 tablet, Rfl: 3   levothyroxine (SYNTHROID) 50 MCG tablet, TAKE ONE TABLET BY MOUTH DAILY BEFORE BREAKFAST (Patient taking differently: Take 50 mcg by mouth daily before breakfast.), Disp: 90 tablet, Rfl: 0   losartan (COZAAR) 50 MG tablet, Take 1 tablet (50 mg total) by mouth 2 (two) times daily., Disp: 180 tablet, Rfl: 3   Magnesium 250 MG TABS, Take 250 mg by mouth  daily., Disp: , Rfl:    Multiple Vitamins-Minerals (MULTIVITAMIN ADULT PO), Take 1 capsule by mouth daily., Disp: , Rfl:    OXYGEN, Inhale 3 L/min into the lungs at bedtime. Continuously, Disp: , Rfl:    phenazopyridine (PYRIDIUM) 200 MG tablet, Take 1 tablet (200 mg total) by mouth 3 (three) times daily as needed for pain., Disp: 10 tablet, Rfl: 0   traMADol (ULTRAM) 50 MG tablet, Take 1-2 tablets (50-100 mg total) by mouth every 6 (six) hours as needed for severe pain., Disp: 15 tablet, Rfl: 2   Vitamin D, Ergocalciferol, (DRISDOL) 1.25 MG (50000 UNIT) CAPS capsule, Take 1 capsule (50,000 Units total) by mouth every 7 (seven)  days., Disp: 12 capsule, Rfl: 0  Allergies  Allergen Reactions   Beef-Derived Products Anaphylaxis    After tick bite, cannot eat beef, pork or lamb   Lambs Quarters Anaphylaxis    After tick bite cannot eat beef, pork or lamb   Mucinex [Guaifenesin Er] Anaphylaxis   Pork-Derived Products Anaphylaxis    After tick bite cannot eat beef, pork or lamb   Darvon [Propoxyphene] Other (See Comments)    Hallucinations    Ace Inhibitors Swelling and Cough    Pedal Edema   Ppd [Tuberculin Purified Protein Derivative]     Always has positive testing to PPD, do not use   Hydromet [Hydrocodone Bit-Homatrop Mbr] Itching and Other (See Comments)    Severe stomach pain-face itches.    Sulfonamide Derivatives Rash   Past Medical History:  Diagnosis Date   Anxiety    Arthritis    Bilateral bunions    Complication of anesthesia    Difficulty sleeping    prior sleep study did not reveal sleep apnea per patient   DJD (degenerative joint disease)    Dyspnea    Dysrhythmia    a-fib   Environmental allergies    Food allergy    Gallbladder problem    GERD (gastroesophageal reflux disease)    Heart murmur    History of kidney stones    Hypertension    no meds after weight loss   Hypothyroidism    Incontinence of urine    at nite    Joint pain    Left atrial dilatation    Left foot pain    Leg edema    Obesity    s/p gastric sleeve 12/2013 (previously weighed close to 400 lbs)   Osteoarthritis    Palpitations    Paroxysmal atrial fibrillation (HCC)    Pneumonia    hx of    PONV (postoperative nausea and vomiting)    Status post bilateral knee replacements    Supplemental oxygen dependent    uses 2l/[place] at night, states HR goes low and O2 drops   Tuberculosis    had 6 month of INH due to exposure    Vaginal vault prolapse     Observations/Objective: A&O  No respiratory distress or wheezing audible over the phone Mood, judgement, and thought processes all WNL  Assessment and  Plan: 1. Bacterial respiratory infection Symptom management discussed. - albuterol (VENTOLIN HFA) 108 (90 Base) MCG/ACT inhaler; Inhale 2 puffs into the lungs every 6 (six) hours as needed.  Dispense: 18 g; Refill: 2 - methylPREDNISolone (MEDROL DOSEPAK) 4 MG TBPK tablet; Use as directed.  Dispense: 21 each; Refill: 0 - amoxicillin-clavulanate (AUGMENTIN) 875-125 MG tablet; Take 1 tablet by mouth 2 (two) times daily for 7 days.  Dispense: 14 tablet; Refill: 0  2. Cough -  Novel Coronavirus, NAA ([facility]); Future - Veritor Flu A/B Waived; Future   Follow Up Instructions:  I discussed the assessment and treatment plan with the patient. The patient was provided an opportunity to ask questions and all were answered. The patient agreed with the plan and demonstrated an understanding of the instructions.   The patient was advised to call back or seek an in-person evaluation if the symptoms worsen or if the condition fails to improve as anticipated.  The above assessment and management plan was discussed with the patient. The patient verbalized understanding of and has agreed to the management plan. Patient is aware to call the clinic if symptoms persist or worsen. Patient is aware when to return to the clinic for a follow-up visit. Patient educated on when it is appropriate to go to the emergency department.   Time call ended: 9:18 AM  I provided 11 minutes of non-face-to-face time during this encounter.  Hendricks Limes, MSN, APRN, FNP-C [facility] 12/20/20

## 2020-12-20 NOTE — Addendum Note (Signed)
Addended by: Collier Bullock on: 12/20/2020 11:25 AM   Modules accepted: Orders

## 2020-12-21 LAB — SARS-COV-2, NAA 2 DAY TAT

## 2020-12-21 LAB — NOVEL CORONAVIRUS, NAA: SARS-CoV-2, NAA: NOT DETECTED

## 2021-01-01 ENCOUNTER — Encounter: Payer: Self-pay | Admitting: Family Medicine

## 2021-01-02 ENCOUNTER — Other Ambulatory Visit: Payer: Self-pay | Admitting: Family Medicine

## 2021-01-02 DIAGNOSIS — F3289 Other specified depressive episodes: Secondary | ICD-10-CM

## 2021-01-02 MED ORDER — SERTRALINE HCL 50 MG PO TABS: 50.0000 mg | ORAL_TABLET | Freq: Every day | ORAL | 5 refills | Status: DC

## 2021-01-02 MED ORDER — MIRABEGRON ER 25 MG PO TB24: 25.0000 mg | ORAL_TABLET | Freq: Every day | ORAL | 0 refills | Status: DC

## 2021-01-09 ENCOUNTER — Other Ambulatory Visit: Payer: Self-pay | Admitting: Family Medicine

## 2021-01-09 DIAGNOSIS — T782XXD Anaphylactic shock, unspecified, subsequent encounter: Secondary | ICD-10-CM

## 2021-01-12 ENCOUNTER — Ambulatory Visit: Payer: Medicare PPO | Admitting: Pharmacist

## 2021-01-24 ENCOUNTER — Telehealth: Payer: Self-pay | Admitting: Family Medicine

## 2021-01-24 ENCOUNTER — Ambulatory Visit: Payer: Medicare PPO | Admitting: Pharmacist

## 2021-01-24 ENCOUNTER — Other Ambulatory Visit: Payer: Self-pay

## 2021-01-24 DIAGNOSIS — E7849 Other hyperlipidemia: Secondary | ICD-10-CM

## 2021-01-24 NOTE — Progress Notes (Signed)
Chronic Care Management Pharmacy Note  01/24/2021 Name:  Rebekah [name] MRN:  [id number] DOB:  February 17, 1956  Summary: METABOLIC SYNDROME/WEIGHT MANAGEMENT  Recommendations/Changes made from today's visit:  Overweight/Obesity Complicated by hyperlipidemia :  New goal. Unable to achieve goal weight loss through lifestyle modification alone; current treatment:n/a--starting Saxenda;  Medications/Strategies previously tried: Status post gastric sleeve in 2015 Working with healthy weight management center (Dr. Leafy Ro) through [facility] She is eating less than 1,000 kcal per day but unable to lose weight on her own Most recent weight: 283 lbs Current exercise: n/a Patient has received extensive dietary counseling including education on focus on lean proteins, fruits and vegetables, whole grains and increased fiber consumption, adequate hydration via health weight management center Extensive exercise counseling including eventual goal of 150 minutes of moderate intensity exercise weekly (as able) Educated on saxenda (wegovy not available until 04/2021)--will plan to switch if able to obtain  START SAXENDA START: 0.6MG INTO THE SKIN ONCE DAILY FOR 1 WEEK INCREASE WEEKLY IN INCREMENTS OF 0.6MG/DAY UNTIL MAINTENANCE DOSAGE OF 3 MG ONCE DAILY IS REACH EXAMPLE: START 0.6MG ONCE DAILY FOR 1 WEEK, INCREASE TO 1.2MG ONCE DAILY FOR 1 WEEK, INCREASE TO 1.8MG ONCE DAILY FOR 1 WEEK AND SO ON TIPS FOR SUCCESS: EAT SMALLER MEALS STOP EATING WHEN YOU FEEL FULL AVOID FAT & FOODS WITH HIGH SUGAR INCREASE YOUR WATER--DRINK AT LEAST HALF OF YOUR WEIGHT IN WATER (OZ) LIMIT CARBONATED BEVERAGES Follow Up Plan: Telephone follow up appointment with care management team member scheduled for: 04/2021  Subjective: Rebekah [name] is an 65 y.o. year old female who is a primary patient of Janora Norlander, DO.  The CCM team was consulted for assistance with disease management and care coordination needs.     Engaged with patient face to face for initial visit in response to provider referral for pharmacy case management and/or care coordination services.   Consent to Services:  The patient was given the following information about Chronic Care Management services today, agreed to services, and gave verbal consent: 1. CCM service includes personalized support from designated clinical staff supervised by the primary care provider, including individualized plan of care and coordination with other care providers 2. 24/7 contact phone numbers for assistance for urgent and routine care needs. 3. Service will only be billed when office clinical staff spend 20 minutes or more in a month to coordinate care. 4. Only one practitioner may furnish and bill the service in a calendar month. 5.The patient may stop CCM services at any time (effective at the end of the month) by phone call to the office staff. 6. The patient will be responsible for cost sharing (co-pay) of up to 20% of the service fee (after annual deductible is met). Patient agreed to services and consent obtained.  Patient Care Team: Janora Norlander, DO as PCP - General (Family Medicine) Starlyn Skeans, MD as Consulting Physician (Family Medicine) Debara Pickett Nadean Corwin, MD as Consulting Physician (Cardiology) Paula Compton, MD as Consulting Physician (Obstetrics and Gynecology) Lavera Guise, [facility] as Pharmacist (Family Medicine)  Objective:  Lab Results  Component Value Date   CREATININE 0.88 11/14/2020   CREATININE 0.96 09/22/2020   CREATININE 0.91 06/30/2020    Lab Results  Component Value Date   HGBA1C 5.8 11/14/2020   Last diabetic Eye exam:  Lab Results  Component Value Date/Time   HMDIABEYEEXA No Retinopathy 05/20/2019 12:00 AM    Last diabetic Foot exam: No results found for: HMDIABFOOTEX  Component Value Date/Time   CHOL 206 (H) 06/30/2020 1104   TRIG 96 06/30/2020 1104   HDL 74 06/30/2020 1104   CHOLHDL 2.8  03/06/2018 1319   CHOLHDL 4.2 07/02/2013 1301   VLDL 43 (H) 07/02/2013 1301   LDLCALC 115 (H) 06/30/2020 1104    Hepatic Function Latest Ref Rng & Units 11/14/2020 06/30/2020 08/13/2019  Total Protein 6.0 - 8.5 g/dL 7.5 7.6 7.2  Albumin 3.8 - 4.8 g/dL 4.6 4.4 4.2  AST 0 - 40 IU/L _0 ALT 0 - 32 IU/L _1 Alk Phosphatase 44 - 121 IU/L 79 69 67  Total Bilirubin 0.0 - 1.2 mg/dL 0.4 0.4 0.4    Lab Results  Component Value Date/Time   TSH 2.150 11/14/2020 11:40 AM   TSH 2.630 06/30/2020 11:04 AM   FREET4 1.25 11/14/2020 11:40 AM   FREET4 1.20 08/13/2019 02:58 PM    CBC Latest Ref Rng & Units 09/22/2020 05/10/2020 12/18/2018  WBC 4.0 - 10.5 K/uL 8.6 8.4 6.5  Hemoglobin 12.0 - 15.0 g/dL 13.0 13.6 13.4  Hematocrit 36.0 - 46.0 % 42.8 41.5 41.5  Platelets 150 - 400 K/uL 194 202 -    Lab Results  Component Value Date/Time   VD25OH 62.0 11/14/2020 11:40 AM   VD25OH 57.1 06/30/2020 11:04 AM    Clinical ASCVD: No  The 10-year ASCVD risk score (Arnett DK, et al., 2019) is: 14.9%   Values used to calculate the score:     Age: 38 years     Sex: Female     Is Non-Hispanic African American: No     Diabetic: Yes     Tobacco smoker: No     Systolic Blood Pressure: 540 mmHg     Is BP treated: Yes     HDL Cholesterol: 74 mg/dL     Total Cholesterol: 206 mg/dL    Other: (CHADS2VASc if Afib, PHQ9 if depression, MMRC or CAT for COPD, ACT, DEXA)  Social History   Tobacco Use  Smoking Status Never  Smokeless Tobacco Never   BP Readings from Last 3 Encounters:  11/14/20 132/73  09/29/20 109/89  09/22/20 (!) 180/100   Pulse Readings from Last 3 Encounters:  11/14/20 84  09/29/20 (!) 154  09/22/20 72   Wt Readings from Last 3 Encounters:  11/14/20 283 lb (128.4 kg)  09/29/20 286 lb (129.7 kg)  09/22/20 286 lb (129.7 kg)    Assessment: Review of patient past medical history, allergies, medications, health status, including review of consultants reports, laboratory and other  test data, was performed as part of comprehensive evaluation and provision of chronic care management services.   SDOH:  (Social Determinants of Health) assessments and interventions performed:    CCM Care Plan  Allergies  Allergen Reactions   Beef-Derived Products Anaphylaxis    After tick bite, cannot eat beef, pork or lamb   Lambs Quarters Anaphylaxis    After tick bite cannot eat beef, pork or lamb   Mucinex [Guaifenesin Er] Anaphylaxis   Pork-Derived Products Anaphylaxis    After tick bite cannot eat beef, pork or lamb   Darvon [Propoxyphene] Other (See Comments)    Hallucinations    Ace Inhibitors Swelling and Cough    Pedal Edema   Ppd [Tuberculin Purified Protein Derivative]     Always has positive testing to PPD, do not use   Hydromet [Hydrocodone Bit-Homatrop Mbr] Itching and Other (See Comments)    Severe stomach pain-face itches.  Sulfonamide Derivatives Rash    Medications Reviewed Today     Reviewed by Lavera Guise, [facility] (Pharmacist) on 02/08/21 at 1009  Med List Status: <None>   Medication Order Taking? Sig Documenting Provider Last Dose Status Informant  acetaminophen (TYLENOL) 500 MG tablet [id number] No Take 500-1,000 mg by mouth every 6 (six) hours as needed for moderate pain or headache. [provider] Taking Active Self  albuterol (VENTOLIN HFA) 108 (90 Base) MCG/ACT inhaler [id number]  Inhale 2 puffs into the lungs every 6 (six) hours as needed. Loman Brooklyn, FNP  Active   apixaban (ELIQUIS) 5 MG TABS tablet [id number] No Take 1 tablet (5 mg total) by mouth 2 (two) times daily. Resume in 48 hours or when no longer having blood in the urine Ardis Hughs, MD Taking Active   cetirizine (ZYRTEC) 10 MG tablet [id number] No Take 10 mg by mouth daily. [provider] Taking Active Self  Cholecalciferol (VITAMIN D) 50 MCG (2000 UT) CAPS [id number] No Take 2,000 Units by mouth daily. [provider] Taking Active Self  diltiazem  (CARDIZEM CD) 120 MG 24 hr capsule [id number] No TAKE 1 CAPSULE BY MOUTH ONCE DAILY  Patient taking differently: Take 120 mg by mouth daily.   Pixie Casino, MD Taking Active   diltiazem (CARDIZEM) 30 MG tablet [id number] No TAKE ONE TO TWO TABLETS BY MOUTH EVERY 6 HOURS AS NEEDED FOR  FAST  HEART  RATES Hilty, Nadean Corwin, MD Taking Active Self  EPINEPHRINE 0.3 mg/0.3 mL IJ SOAJ injection [id number]  inject 0.81ms into the muscle once as needed for anaphylaxis GRonnie DossM, DO  Active   flecainide (TAMBOCOR) 100 MG tablet [id number] Take 3 tablets by mouth at onset of fast heart rate as needed  Patient taking differently: Take 300 mg by mouth daily as needed (onset of afib).   HPixie Casino MD Taking Active   furosemide (LASIX) 20 MG tablet [id number] TAKE 1 TABLET BY MOUTH ONCE DAILY  Patient taking differently: Take 20 mg by mouth daily.   HPixie Casino MD Taking Active   Insulin Pen Needle 32G X 6 MM MISC [id number] Use to inject Saxenda daily as directed GRonnie DossM, DO  Active   levothyroxine (SYNTHROID) 50 MCG tablet [id number] TAKE ONE TABLET BY MOUTH DAILY BEFORE BREAKFAST  Patient taking differently: Take 50 mcg by mouth daily before breakfast.   GRonnie DossM, DO Taking Active   Liraglutide -Weight Management (SAXENDA) 18 MG/3ML SOPN [id number] Inject 3 mg into the skin daily. Patient would like 90 day supply. GRonnie DossM, DO  Active   losartan (COZAAR) 50 MG tablet [id number] Take 1 tablet (50 mg total) by mouth 2 (two) times daily. HPixie Casino MD Taking Active Self    Discontinued 02/08/21 1009 (Error)   mirabegron ER (MYRBETRIQ) 25 MG TB24 tablet [id number] Take 1 tablet (25 mg total) by mouth daily. From urologist GJanora Norlander DO  Active   Multiple Vitamins-Minerals (MULTIVITAMIN ADULT PO) [id number] Take 1 capsule by mouth daily. [provider] Taking Active Self  OXYGEN [id number] Inhale 3 L/min into the  lungs at bedtime. Continuously [provider] Taking Active Self  sertraline (ZOLOFT) 50 MG tablet [id number] Take 1 tablet (50 mg total) by mouth daily. GRonnie DossM, DO  Active   traMADol (ULTRAM) 50 MG tablet [id number] Take 1-2 tablets (50-100 mg total) by mouth every 6 (  six) hours as needed for severe pain. Ardis Hughs, MD Taking Active   Discontinued 02/08/21 1009 (Error)             Patient Active Problem List   Diagnosis Date Noted   Recurrent UTI 05/19/2019   Cystocele with uterine prolapse 10/27/2018   Elevated LFTs 05/16/2017   Vitamin D deficiency 05/02/2017   Other hyperlipidemia 05/02/2017   Other fatigue 01/01/2017   Shortness of breath on exertion 01/01/2017   Other specified hypothyroidism 01/01/2017   Chronic atrial fibrillation ([place]) 01/01/2017   Obese 05/20/2015   S/P right TKA 05/17/2015   S/P knee replacement 05/17/2015   Paroxysmal A-fib ([place]) 07/20/2014   Prediabetes 01/06/2014   Dyslipidemia 01/06/2014   Arthritis 01/06/2014   S/P laparoscopic sleeve gastrectomy 01/04/14 01/06/2014   Postoperative atrial fibrillation - resolved 01/06/2014   Morbid obesity due to excess calories ([place]) 01/04/2014   Morbid obesity with BMI of 60.0-69.9, adult ([place]) 10/28/2013   Dyspnea on exertion 09/05/2012   Essential hypertension 04/21/2007   G E R D 04/21/2007    Immunization History  Administered Date(s) Administered   Fluad Quad(high Dose 65+) 12/25/2018   Influenza Split 02/15/2012   Influenza,inj,Quad PF,6+ Mos 01/06/2014, 01/30/2017, 01/26/2018   Influenza-Unspecified 01/26/2018, 02/08/2020   PFIZER(Purple Top)SARS-COV-2 Vaccination 08/17/2019, 09/07/2019, 02/08/2020   Zoster Recombinat (Shingrix) 11/14/2020   Zoster, Live 11/24/2015    Conditions to be addressed/monitored: OBESITY  Care Plan : [facility] MEDICATION MANAGEMENT  Updates made by Lavera Guise, [place] since 02/08/2021 12:00 AM     Problem: METABOLIC SYNDROME       Long-Range Goal: Weight loss   This Visit's Progress: Not on track  Priority: High  Note:   Current Barriers:  Unable to achieve control of obesity   Pharmacist Clinical Goal(s):  patient will achieve control of weight as evidenced by decreased weight  through collaboration with PharmD and provider.    Interventions: 1:1 collaboration with Janora Norlander, DO regarding development and update of comprehensive plan of care as evidenced by provider attestation and co-signature Inter-disciplinary care team collaboration (see longitudinal plan of care) Comprehensive medication review performed; medication list updated in electronic medical record  Overweight/Obesity Complicated by hyperlipidemia :  New goal. Unable to achieve goal weight loss through lifestyle modification alone; current treatment:n/a--starting Saxenda;  Medications/Strategies previously tried: Status post gastric sleeve in 2015 Working with healthy weight management center (Dr. Leafy Ro) through [facility] She is eating less than 1,000 kcal per day but unable to lose weight on her own Most recent weight: 283 lbs Current exercise: n/a Patient has received extensive dietary counseling including education on focus on lean proteins, fruits and vegetables, whole grains and increased fiber consumption, adequate hydration via health weight management center Extensive exercise counseling including eventual goal of 150 minutes of moderate intensity exercise weekly (as able) Educated on saxenda (wegovy not available until 04/2021)--will plan to switch if able to obtain Denies personal and family history of Medullary thyroid cancer (MTC)  START SAXENDA START: 0.6MG INTO THE SKIN ONCE DAILY FOR 1 WEEK INCREASE WEEKLY IN INCREMENTS OF 0.6MG/DAY UNTIL MAINTENANCE DOSAGE OF 3 MG ONCE DAILY IS REACH EXAMPLE: START 0.6MG ONCE DAILY FOR 1 WEEK, INCREASE TO 1.2MG ONCE DAILY FOR 1 WEEK, INCREASE TO 1.8MG ONCE DAILY FOR 1 WEEK AND SO  ON TIPS FOR SUCCESS: EAT SMALLER MEALS STOP EATING WHEN YOU FEEL FULL AVOID FAT & FOODS WITH HIGH SUGAR INCREASE YOUR WATER--DRINK AT LEAST HALF OF YOUR WEIGHT IN WATER (  OZ) LIMIT CARBONATED BEVERAGES  Patient Goals/Self-Care Activities patient will:  - take medications as prescribed collaborate with provider on medication access solutions  Follow Up Plan: Telephone follow up appointment with care management team member scheduled for: 04/2021      Medication Assistance: None required.  Patient affirms current coverage meets needs.  Patient's preferred pharmacy is:  [facility] [place], [address] [place] [address] Phone: [phone] Fax: [phone]  [id number] [place], [place] [id number]: [phone]: [phone]  Follow Up:  Patient agrees to Care Plan and Follow-up.  Plan: Telephone follow up appointment with care management team member scheduled for:  04/2021   JRegina Eck PharmD, BCPS Clinical Pharmacist, [place] Phone [phone]

## 2021-01-24 NOTE — Telephone Encounter (Signed)
Pt said that needs to let Almyra Free know that she found out the info about [place]. Please call back.

## 2021-01-25 ENCOUNTER — Encounter: Payer: Self-pay | Admitting: Family Medicine

## 2021-01-25 NOTE — Telephone Encounter (Signed)
She contacted her insurance and they told her that Kirke Shaggy is covered but at a higher tier and they only cover it for every other day?? It also needs PA  Mancel Parsons is covered but only for 2 of the 4 shots a month. Also needs a PA  She is also going to send a  Mychart message once she contacts them again with hopefully more info

## 2021-01-25 NOTE — Telephone Encounter (Signed)
Can you see what she found out? Unfortunately, we found out Wegovy (weight loss medicine) is back ordered until January so we will likely have to go with saxenda as well. I'll be out of office until 10/18--if you let me know I can help her sooner when I log back on!   Thanks!

## 2021-02-01 MED ORDER — SAXENDA 18 MG/3ML ~~LOC~~ SOPN: 3.0000 mg | PEN_INJECTOR | Freq: Every day | SUBCUTANEOUS | 3 refills | Status: DC

## 2021-02-01 NOTE — Telephone Encounter (Signed)
Reviewed patient message--- that does not sound right---I would say they need to cover all of the therapy for the month or it will be too expensive.  In my experience, medicare (unless they have additional commercial policy) doesn't cover obesity medications  Mancel Parsons is on back order until January 2023  Kirke Shaggy is available but patient should be aware of exact cost  Thanks ! Almyra Free

## 2021-02-02 ENCOUNTER — Encounter: Payer: Self-pay | Admitting: Family Medicine

## 2021-02-03 ENCOUNTER — Telehealth: Payer: Self-pay | Admitting: Pharmacist

## 2021-02-03 NOTE — Telephone Encounter (Signed)
Attempted to complete PA for saxenda and it says it's available without authorization  Please call pharmacy and patient to confirm

## 2021-02-06 NOTE — Telephone Encounter (Signed)
Patient went by the pharmacy and asked if she had gotten a 30 or 90 supply called in from [facility]. Rebekah [name]'s RX was for 90 days. Rebekah [name] states they only got a 30 day. Didn't get 90 day and might need PA. She said if it didn't make any sense for [name] to call her.

## 2021-02-06 NOTE — Telephone Encounter (Signed)
PA was not needed and pharmacy has filled. I spoke with patient and she states that she sent a mychart message in regards to this. I am not sure exactly what we need to do.

## 2021-02-07 ENCOUNTER — Encounter: Payer: Self-pay | Admitting: Family Medicine

## 2021-02-07 MED ORDER — SAXENDA 18 MG/3ML ~~LOC~~ SOPN: 3.0000 mg | PEN_INJECTOR | Freq: Every day | SUBCUTANEOUS | 3 refills | Status: DC

## 2021-02-07 NOTE — Telephone Encounter (Signed)
Call returned to patient  [facility] Unsure if PA has to be completed Patient to follow up

## 2021-02-08 MED ORDER — INSULIN PEN NEEDLE 32G X 6 MM MISC: 3 refills | Status: DC

## 2021-02-08 NOTE — Patient Instructions (Signed)
Visit Information  PATIENT GOALS:  Goals Addressed               This Visit's Progress     Patient Stated     WEIGHT MANAGEMENT (pt-stated)        Current Barriers:  Unable to achieve control of obesity   Pharmacist Clinical Goal(s):  patient will achieve control of weight as evidenced by decreased weight through collaboration with PharmD and provider.    Interventions: 1:1 collaboration with Janora Norlander, DO regarding development and update of comprehensive plan of care as evidenced by provider attestation and co-signature Inter-disciplinary care team collaboration (see longitudinal plan of care) Comprehensive medication review performed; medication list updated in electronic medical record  Overweight/Obesity Complicated by hyperlipidemia :  New goal. Unable to achieve goal weight loss through lifestyle modification alone; current treatment:n/a--starting Saxenda;  Medications/Strategies previously tried: Status post gastric sleeve in 2015 Working with healthy weight management center (Dr. Leafy Ro) through [facility] She is eating less than 1,000 kcal per day but unable to lose weight on her own Most recent weight: 283 lbs Current exercise: n/a Patient has received extensive dietary counseling including education on focus on lean proteins, fruits and vegetables, whole grains and increased fiber consumption, adequate hydration via health weight management center Extensive exercise counseling including eventual goal of 150 minutes of moderate intensity exercise weekly (as able) Educated on saxenda (wegovy not available until 04/2021)--will plan to switch if able to obtain Denies personal and family history of Medullary thyroid cancer (MTC)  START SAXENDA START: 0.6MG  INTO THE SKIN ONCE DAILY FOR 1 WEEK INCREASE WEEKLY IN INCREMENTS OF 0.6MG /DAY UNTIL MAINTENANCE DOSAGE OF 3 MG ONCE DAILY IS REACH EXAMPLE: START 0.6MG  ONCE DAILY FOR 1 WEEK, INCREASE TO 1.2MG  ONCE DAILY  FOR 1 WEEK, INCREASE TO 1.8MG  ONCE DAILY FOR 1 WEEK AND SO ON TIPS FOR SUCCESS: EAT SMALLER MEALS STOP EATING WHEN YOU FEEL FULL AVOID FAT & FOODS WITH HIGH SUGAR INCREASE YOUR WATER--DRINK AT [place] (OZ) LIMIT CARBONATED BEVERAGES  Patient Goals/Self-Care Activities patient will:  - take medications as prescribed collaborate with provider on medication access solutions  Follow Up Plan: Telephone follow up appointment with care management team member scheduled for: 04/2021         The patient verbalized understanding of instructions, educational materials, and care plan provided today and declined offer to receive copy of patient instructions, educational materials, and care plan.   Telephone follow up appointment with care management team member scheduled for: 04/2021  Signature Regina Eck, PharmD, BCPS Clinical Pharmacist, [facility] Phone [phone]

## 2021-02-10 ENCOUNTER — Other Ambulatory Visit: Payer: Self-pay | Admitting: Family Medicine

## 2021-02-10 ENCOUNTER — Other Ambulatory Visit: Payer: Self-pay | Admitting: Internal Medicine

## 2021-02-10 DIAGNOSIS — E034 Atrophy of thyroid (acquired): Secondary | ICD-10-CM

## 2021-02-13 DIAGNOSIS — E7849 Other hyperlipidemia: Secondary | ICD-10-CM | POA: Diagnosis not present

## 2021-02-13 DIAGNOSIS — E663 Overweight: Secondary | ICD-10-CM | POA: Diagnosis not present

## 2021-02-17 ENCOUNTER — Ambulatory Visit (INDEPENDENT_AMBULATORY_CARE_PROVIDER_SITE_OTHER): Payer: Medicare PPO

## 2021-02-17 VITALS — BP 120/70 | Ht 61.0 in | Wt 283.0 lb

## 2021-02-17 DIAGNOSIS — Z0000 Encounter for general adult medical examination without abnormal findings: Secondary | ICD-10-CM

## 2021-02-17 NOTE — Patient Instructions (Signed)
Rebekah [name] , Thank you for taking time to come for your Medicare Wellness Visit. I appreciate your ongoing commitment to your health goals. Please review the following plan we discussed and let me know if I can assist you in the future.   Screening recommendations/referrals: Colonoscopy: Return Cologuard Mammogram: Done 11/07/2020 - Repeat annually  Bone Density: Done 05/13/2020 - Repeat every 2 years  Recommended yearly ophthalmology/optometry visit for glaucoma screening and checkup Recommended yearly dental visit for hygiene and checkup  Vaccinations: Influenza vaccine: Done 02/08/2020 - Repeat annually *due Pneumococcal vaccine: Due Tdap vaccine: Due Shingles vaccine: Done 11/14/2020 - get second dose at next visit in February   Covid-19: Done 08/17/2019, 09/07/2019, & 02/08/2020  Advanced directives: Please bring a copy of your health care power of attorney and living will to the office to be added to your chart at your convenience.   Conditions/risks identified: Aim for 30 minutes of exercise or brisk walking each day, drink 6-8 glasses of water and eat lots of fruits and vegetables.   Next appointment: Follow up in one year for your annual wellness visit    Preventive Care 65 Years and Older, Female Preventive care refers to lifestyle choices and visits with your health care provider that can promote health and wellness. What does preventive care include? A yearly physical exam. This is also called an annual well check. Dental exams once or twice a year. Routine eye exams. Ask your health care provider how often you should have your eyes checked. Personal lifestyle choices, including: Daily care of your teeth and gums. Regular physical activity. Eating a healthy diet. Avoiding tobacco and drug use. Limiting alcohol use. Practicing safe sex. Taking low-dose aspirin every day. Taking vitamin and mineral supplements as recommended by your health care provider. What happens during  an annual well check? The services and screenings done by your health care provider during your annual well check will depend on your age, overall health, lifestyle risk factors, and family history of disease. Counseling  Your health care provider may ask you questions about your: Alcohol use. Tobacco use. Drug use. Emotional well-being. Home and relationship well-being. Sexual activity. Eating habits. History of falls. Memory and ability to understand (cognition). Work and work Statistician. Reproductive health. Screening  You may have the following tests or measurements: Height, weight, and BMI. Blood pressure. Lipid and cholesterol levels. These may be checked every 5 years, or more frequently if you are over 51 years old. Skin check. Lung cancer screening. You may have this screening every year starting at age 65 if you have a 30-pack-year history of smoking and currently smoke or have quit within the past 15 years. Fecal occult blood test (FOBT) of the stool. You may have this test every year starting at age 37. Flexible sigmoidoscopy or colonoscopy. You may have a sigmoidoscopy every 5 years or a colonoscopy every 10 years starting at age 63. Hepatitis C blood test. Hepatitis B blood test. Sexually transmitted disease (STD) testing. Diabetes screening. This is done by checking your blood sugar (glucose) after you have not eaten for a while (fasting). You may have this done every 1-3 years. Bone density scan. This is done to screen for osteoporosis. You may have this done starting at age 84. Mammogram. This may be done every 1-2 years. Talk to your health care provider about how often you should have regular mammograms. Talk with your health care provider about your test results, treatment options, and if necessary, the need for  more tests. Vaccines  Your health care provider may recommend certain vaccines, such as: Influenza vaccine. This is recommended every year. Tetanus,  diphtheria, and acellular pertussis (Tdap, Td) vaccine. You may need a Td booster every 10 years. Zoster vaccine. You may need this after age 69. Pneumococcal 13-valent conjugate (PCV13) vaccine. One dose is recommended after age 75. Pneumococcal polysaccharide (PPSV23) vaccine. One dose is recommended after age 53. Talk to your health care provider about which screenings and vaccines you need and how often you need them. This information is not intended to replace advice given to you by your health care provider. Make sure you discuss any questions you have with your health care provider. Document Released: 04/29/2015 Document Revised: 12/21/2015 Document Reviewed: 02/01/2015 Elsevier Interactive Patient Education  2017 [place] Prevention in the Home Falls can cause injuries. They can happen to people of all ages. There are many things you can do to make your home safe and to help prevent falls. What can I do on the outside of my home? Regularly fix the edges of walkways and driveways and fix any cracks. Remove anything that might make you trip as you walk through a door, such as a raised step or threshold. Trim any bushes or trees on the path to your home. Use bright outdoor lighting. Clear any walking paths of anything that might make someone trip, such as rocks or tools. Regularly check to see if handrails are loose or broken. Make sure that both sides of any steps have handrails. Any raised decks and porches should have guardrails on the edges. Have any leaves, snow, or ice cleared regularly. Use sand or salt on walking paths during winter. Clean up any spills in your garage right away. This includes oil or grease spills. What can I do in the bathroom? Use night lights. Install grab bars by the toilet and in the tub and shower. Do not use towel bars as grab bars. Use non-skid mats or decals in the tub or shower. If you need to sit down in the shower, use a plastic,  non-slip stool. Keep the floor dry. Clean up any water that spills on the floor as soon as it happens. Remove soap buildup in the tub or shower regularly. Attach bath mats securely with double-sided non-slip rug tape. Do not have throw rugs and other things on the floor that can make you trip. What can I do in the bedroom? Use night lights. Make sure that you have a light by your bed that is easy to reach. Do not use any sheets or blankets that are too big for your bed. They should not hang down onto the floor. Have a firm chair that has side arms. You can use this for support while you get dressed. Do not have throw rugs and other things on the floor that can make you trip. What can I do in the kitchen? Clean up any spills right away. Avoid walking on wet floors. Keep items that you use a lot in easy-to-reach places. If you need to reach something above you, use a strong step stool that has a grab bar. Keep electrical cords out of the way. Do not use floor polish or wax that makes floors slippery. If you must use wax, use non-skid floor wax. Do not have throw rugs and other things on the floor that can make you trip. What can I do with my stairs? Do not leave any items on the stairs. Make  sure that there are handrails on both sides of the stairs and use them. Fix handrails that are broken or loose. Make sure that handrails are as long as the stairways. Check any carpeting to make sure that it is firmly attached to the stairs. Fix any carpet that is loose or worn. Avoid having throw rugs at the top or bottom of the stairs. If you do have throw rugs, attach them to the floor with carpet tape. Make sure that you have a light switch at the top of the stairs and the bottom of the stairs. If you do not have them, ask someone to add them for you. What else can I do to help prevent falls? Wear shoes that: Do not have high heels. Have rubber bottoms. Are comfortable and fit you well. Are closed  at the toe. Do not wear sandals. If you use a stepladder: Make sure that it is fully opened. Do not climb a closed stepladder. Make sure that both sides of the stepladder are locked into place. Ask someone to hold it for you, if possible. Clearly mark and make sure that you can see: Any grab bars or handrails. First and last steps. Where the edge of each step is. Use tools that help you move around (mobility aids) if they are needed. These include: Canes. Walkers. Scooters. Crutches. Turn on the lights when you go into a dark area. Replace any light bulbs as soon as they burn out. Set up your furniture so you have a clear path. Avoid moving your furniture around. If any of your floors are uneven, fix them. If there are any pets around you, be aware of where they are. Review your medicines with your doctor. Some medicines can make you feel dizzy. This can increase your chance of falling. Ask your doctor what other things that you can do to help prevent falls. This information is not intended to replace advice given to you by your health care provider. Make sure you discuss any questions you have with your health care provider. Document Released: 01/27/2009 Document Revised: 09/08/2015 Document Reviewed: 05/07/2014 Elsevier Interactive Patient Education  2017 [employer].

## 2021-02-17 NOTE — Progress Notes (Signed)
Subjective:   Rebekah [name] is a 65 y.o. female who presents for Medicare Annual (Subsequent) preventive examination.  Virtual Visit via Telephone Note  I connected with  Rebekah [name] on 02/17/21 at 11:15 AM EDT by telephone and verified that I am speaking with the correct person using two identifiers.  Location: Patient: Home Provider: [facility] Persons participating in the virtual visit: patient/Nurse Health Advisor   I discussed the limitations, risks, security and privacy concerns of performing an evaluation and management service by telephone and the availability of in person appointments. The patient expressed understanding and agreed to proceed.  Interactive audio and video telecommunications were attempted between this nurse and patient, however failed, due to patient having technical difficulties OR patient did not have access to video capability.  We continued and completed visit with audio only.  Some vital signs may be absent or patient reported.   Rebekah [name] E Rebekah Buresh, LPN   Review of Systems     Cardiac Risk Factors include: advanced age (>74mn, >>36women);obesity (BMI >30kg/m2);sedentary lifestyle;dyslipidemia;Other (see comment);family history of premature cardiovascular disease, Risk factor comments: A.Fib     Objective:    Today's Vitals   02/17/21 1121 02/17/21 1125  BP: 120/70   Weight: 283 lb (128.4 kg)   Height: _0  (1.549 m)   PainSc:  6    Body mass index is 53.47 kg/m.  Advanced Directives 02/17/2021 09/22/2020 05/01/2016 03/15/2016 03/12/2016 12/27/2015 06/22/2015  Does Patient Have a Medical Advance Directive? _1  Yes No  Type of [place] will Living will;Healthcare Power of Attorney Living will - [place]Living will - -  Does patient want to make changes to medical advance directive? - - No - Patient declined No - Patient declined - No - Patient declined -  Copy of [place] Chart? No - copy requested - - - Yes - -  Would patient like information on creating a medical advance directive? - - - - - - -    Current Medications (verified) Outpatient Encounter Medications as of 02/17/2021  Medication Sig   acetaminophen (TYLENOL) 500 MG tablet Take 500-1,000 mg by mouth every 6 (six) hours as needed for moderate pain or headache.   albuterol (VENTOLIN HFA) 108 (90 Base) MCG/ACT inhaler Inhale 2 puffs into the lungs every 6 (six) hours as needed.   apixaban (ELIQUIS) 5 MG TABS tablet Take 1 tablet (5 mg total) by mouth 2 (two) times daily. Resume in 48 hours or when no longer having blood in the urine   cetirizine (ZYRTEC) 10 MG tablet Take 10 mg by mouth daily.   Cholecalciferol (VITAMIN D) 50 MCG (2000 UT) CAPS Take 2,000 Units by mouth daily.   diltiazem (CARDIZEM CD) 120 MG 24 hr capsule TAKE 1 CAPSULE BY MOUTH ONCE DAILY (Patient taking differently: Take 120 mg by mouth daily.)   diltiazem (CARDIZEM) 30 MG tablet TAKE ONE TO TWO TABLETS BY MOUTH EVERY 6 HOURS AS NEEDED FOR  FAST  HEART  RATES   EPINEPHRINE 0.3 mg/0.3 mL IJ SOAJ injection inject 0.347m into the muscle once as needed for anaphylaxis   flecainide (TAMBOCOR) 100 MG tablet Take 3 tablets by mouth at onset of fast heart rate as needed (Patient taking differently: Take 300 mg by mouth daily as needed (onset of afib).)   furosemide (LASIX) 20 MG tablet TAKE 1 TABLET BY MOUTH ONCE DAILY (Patient taking differently: Take 20  mg by mouth daily.)   Insulin Pen Needle 32G X 6 MM MISC Use to inject Saxenda daily as directed   levothyroxine (SYNTHROID) 50 MCG tablet TAKE ONE TABLET BY MOUTH DAILY BEFORE BREAKFAST   Liraglutide -Weight Management (SAXENDA) 18 MG/3ML SOPN Inject 3 mg into the skin daily. Patient would like 90 day supply.   losartan (COZAAR) 50 MG tablet Take 1 tablet (50 mg total) by mouth 2 (two) times daily.   mirabegron ER (MYRBETRIQ) 25 MG TB24 tablet Take 1 tablet (25 mg total)  by mouth daily. From urologist   Multiple Vitamins-Minerals (MULTIVITAMIN ADULT PO) Take 1 capsule by mouth daily.   OXYGEN Inhale 3 L/min into the lungs at bedtime. Continuously   sertraline (ZOLOFT) 50 MG tablet Take 1 tablet (50 mg total) by mouth daily.   traMADol (ULTRAM) 50 MG tablet Take 1-2 tablets (50-100 mg total) by mouth every 6 (six) hours as needed for severe pain.   No facility-administered encounter medications on file as of 02/17/2021.    Allergies (verified) Beef-derived products, Lambs quarters, Mucinex [guaifenesin er], Pork-derived products, Darvon [propoxyphene], Ace inhibitors, Ppd [tuberculin purified protein derivative], Hydromet [hydrocodone bit-homatrop mbr], and Sulfonamide derivatives   History: Past Medical History:  Diagnosis Date   Allergy    Multiple   Anxiety    Arthritis    Bilateral bunions    Cataract    Both eyes- lenses replaced   Chronic kidney disease 2022   Not kidney disease but kidney stone   Complication of anesthesia    Depression    More anxiety-worry-stress   Difficulty sleeping    prior sleep study did not reveal sleep apnea per patient   DJD (degenerative joint disease)    Dyspnea    Dysrhythmia    a-fib   Environmental allergies    Food allergy    Gallbladder problem    GERD (gastroesophageal reflux disease)    Heart murmur    History of kidney stones    Hypertension    no meds after weight loss   Hypothyroidism    Incontinence of urine    at nite    Joint pain    Left atrial dilatation    Left foot pain    Leg edema    Obesity    s/p gastric sleeve 12/2013 (previously weighed close to 400 lbs)   Osteoarthritis    Oxygen deficiency    3L/min for sleep due to slowed reaperations during sleep   Palpitations    Paroxysmal atrial fibrillation (HCC)    Pneumonia    hx of    PONV (postoperative nausea and vomiting)    Status post bilateral knee replacements    Supplemental oxygen dependent    uses 2l/Miller at night,  states HR goes low and O2 drops   Tuberculosis    had 6 month of INH due to exposure    Vaginal vault prolapse    Past Surgical History:  Procedure Laterality Date   APPENDECTOMY     CHOLECYSTECTOMY     CYSTOSCOPY/URETEROSCOPY/HOLMIUM LASER/STENT PLACEMENT Left 09/29/2020   Procedure: CYSTOSCOPY WITH LEFT RETROGRADE PYELOGRAM AND LEFT URETEROSCOPY/HOLMIUM LASER STONE REMOVAL LEFT /STENT PLACEMENT;  Surgeon: Ardis Hughs, MD;  Location: [facility];  Service: Urology;  Laterality: Left;   EYE SURGERY     03/2014 lens implant left lens    FOOT ARTHRODESIS Left 03/15/2016   Procedure: TALONAVICULAR AND SUBTALAR ARTHRODESIS;  Surgeon: Wylene Simmer, MD;  Location: [place];  Service:  Orthopedics;  Laterality: Left;   GASTROC RECESSION EXTREMITY Left 03/15/2016   Procedure: LEFT GASTROC RECESSION;  Surgeon: Wylene Simmer, MD;  Location: [place];  Service: Orthopedics;  Laterality: Left;   JOINT REPLACEMENT  04/16/1997   L TOTAL KNEE   LAPAROSCOPIC GASTRIC SLEEVE RESECTION N/A 01/04/2014   Procedure: LAPAROSCOPIC GASTRIC SLEEVE RESECTION;  Surgeon: Greer Pickerel, MD;  Location: [facility];  Service: General;  Laterality: N/A;   TONSILLECTOMY     TOTAL KNEE ARTHROPLASTY Right 05/17/2015   Procedure: RIGHT TOTAL KNEE ARTHROPLASTY;  Surgeon: Paralee Cancel, MD;  Location: [facility];  Service: Orthopedics;  Laterality: Right;   TRANSTHORACIC ECHOCARDIOGRAM  11/14/2012   EF 60-65%, mod LVH & mod conc hypertrophy, grade 1 diastolic dysfunction; LA mildly dilated; RV systolic pressure increased; PA peak pressure 80mHg   TUBAL LIGATION     UPPER GI ENDOSCOPY  01/04/2014   Procedure: UPPER GI ENDOSCOPY;  Surgeon: EGreer Pickerel MD;  Location: [facility];  Service: General;;   Family History  Problem Relation Age of Onset   Diabetes Father    Hyperlipidemia Father    Hypertension Father    Heart disease Father    Sudden death Father    Anxiety disorder Father    Obesity Father     Varicose Veins Father    Uterine cancer Mother    Cervical cancer Mother    Breast cancer Mother    Cancer Mother        cervical and brreast cancer   Hypertension Mother    Hyperlipidemia Mother    Obesity Mother    Arthritis Mother    Diabetes Maternal Grandmother    Obesity Maternal Grandmother    Diabetes Brother    Hypertension Brother    Stroke Brother    Hypertension Brother    Obesity Daughter    Social History   Socioeconomic History   Marital status: Married    Spouse name: CHafsah Hendler  Number of children: 2   Years of education: Not on file   Highest education level: Bachelor's degree (e.g., BA, AB, BS)  Occupational History   Occupation: special ePublic relations account executive OTHER    Comment: RDiplomatic Services operational officer Schools   Occupation: retired nMarine scientist- L&D @ [employer]  Tobacco Use   Smoking status: Never   Smokeless tobacco: Never   Tobacco comments:    Tried 1-2 cigarettes as a child. Nothing else.  Vaping Use   Vaping Use: Never used  Substance and Sexual Activity   Alcohol use: No   Drug use: No   Sexual activity: Not Currently    Birth control/protection: Abstinence    Comment: Age, vaginal prolapse  Other Topics Concern   Not on file  Social History Narrative   [place]ives [place]   Previously a nMarine scientistand subsequently a school teacher   Social Determinants of Health   Financial Resource Strain: Low Risk    Difficulty of Paying Living Expenses: Not very hard  Food Insecurity: No Food Insecurity   Worried About RCharity fundraiserin the Last Year: Never true   RRutledgein the Last Year: Never true  Transportation Needs: No Transportation Needs   Lack of Transportation (Medical): No   Lack of Transportation (Non-Medical): No  Physical Activity: Inactive   Days of Exercise per Week: 0 days   Minutes of Exercise per Session: 0 min  Stress: Stress Concern Present   Feeling of  Stress : To some extent  Social  Connections: Engineer, building services of Communication with Friends and Family: More than three times a week   Frequency of Social Gatherings with Friends and Family: More than three times a week   Attends Religious Services: More than 4 times per year   Active Member of [employer] or Organizations: Yes   Attends Archivist Meetings: 1 to 4 times per year   Marital Status: Married    Tobacco Counseling Counseling given: Not Answered Tobacco comments: Tried 1-2 cigarettes as a child. Nothing else.   Clinical Intake:  Pre-visit preparation completed: Yes  Pain : 0-10 Pain Score: 6  Pain Type: Chronic pain Pain Location: Generalized Pain Descriptors / Indicators: Aching, Discomfort Pain Onset: More than a month ago     BMI - recorded: 53.47 Nutritional Status: BMI > 30  Obese Nutritional Risks: None Diabetes: No  How often do you need to have someone help you when you read instructions, pamphlets, or other written materials from your doctor or pharmacy?: 1 - Never  Diabetic? no  Interpreter Needed?: No  Information entered by :: Tacari Repass, LPN   Activities of Daily Living In your present state of health, do you have any difficulty performing the following activities: 02/17/2021 09/22/2020  Hearing? Tempie Donning  Vision? N N  Difficulty concentrating or making decisions? Y N  Walking or climbing stairs? N Y  Dressing or bathing? N N  Doing errands, shopping? N N  Preparing Food and eating ? N -  Using the Toilet? N -  In the past six months, have you accidently leaked urine? Y -  Comment improved - was having recurrent UTIs -  Do you have problems with loss of bowel control? N -  Managing your Medications? N -  Managing your Finances? N -  Housekeeping or managing your Housekeeping? N -  Some recent data might be hidden    Patient Care Team: Janora Norlander, DO as PCP - General (Family Medicine) Starlyn Skeans, MD as Consulting Physician (Family  Medicine) Debara Pickett Nadean Corwin, MD as Consulting Physician (Cardiology) Paula Compton, MD as Consulting Physician (Obstetrics and Gynecology) Lavera Guise, [facility] as Pharmacist (Family Medicine)  Indicate any recent Medical Services you may have received from other than [facility] providers in the past year (date may be approximate).     Assessment:   This is a routine wellness examination for Rechy.  Hearing/Vision screen Hearing Screening - Comments:: Congenital deafness in left ear; moderate hearing loss in right ear - no hearing aids Vision Screening - Comments:: Wears readers prn only - up to date with annual eye exams with [facility]  Dietary issues and exercise activities discussed: Current Exercise Habits: The patient does not participate in regular exercise at present, Exercise limited by: orthopedic condition(s)   Goals Addressed             This Visit's Progress    Exercise 3x per week (30 min per time)   Not on track      Depression Screen PHQ 2/9 Scores 02/17/2021 11/14/2020 05/10/2020 02/17/2020 11/17/2019 11/17/2019 05/19/2019  PHQ - 2 Score _0 0 0 0 0  PHQ- 9 Score - _1 - - 3    Fall Risk Fall Risk  02/17/2021 11/14/2020 05/10/2020 02/17/2020 11/17/2019  Falls in the past year? 0 0 0 1 1  Number falls in past yr: 0 - - 1 1  Injury with  Fall? 0 - - 0 0  Risk for fall due to : Orthopedic patient;Medication side effect - - History of fall(s);No Fall Risks History of fall(s)  Follow up Falls prevention discussed - - Falls prevention discussed Falls evaluation completed    FALL RISK PREVENTION PERTAINING TO THE HOME:  Any stairs in or around the home? No  If so, are there any without handrails? No  Home free of loose throw rugs in walkways, pet beds, electrical cords, etc? Yes  Adequate lighting in your home to reduce risk of falls? Yes   ASSISTIVE DEVICES UTILIZED TO PREVENT FALLS:  Life alert? No  Use of a cane, walker or w/c? No  Grab bars in the bathroom? Yes   Shower chair or bench in shower? No  Elevated toilet seat or a handicapped toilet? No   TIMED UP AND GO:  Was the test performed? No . Telephonic visit.  Cognitive Function: MMSE - Mini Mental State Exam 11/14/2020 02/17/2020  Orientation to time 5 5  Orientation to Place 5 5  Registration 0 3  Attention/ Calculation 2 5  Recall 3 3  Language- name 2 objects 2 2  Language- repeat 1 1  Language- follow 3 step command 3 3  Language- read & follow direction 1 1  Write a sentence 1 1  Copy design 1 1  Total score 24 30     6CIT Screen 02/17/2021  What Year? 0 points  What month? 0 points  What time? 0 points  Count back from 20 0 points  Months in reverse 0 points  Repeat phrase 6 points  Total Score 6    Immunizations Immunization History  Administered Date(s) Administered   Fluad Quad(high Dose 65+) 12/25/2018   Influenza Split 02/15/2012   Influenza,inj,Quad PF,6+ Mos 01/06/2014, 01/30/2017, 01/26/2018   Influenza-Unspecified 01/26/2018, 02/08/2020   PFIZER(Purple Top)SARS-COV-2 Vaccination 08/17/2019, 09/07/2019, 02/08/2020   Zoster Recombinat (Shingrix) 11/14/2020   Zoster, Live 11/24/2015    TDAP status: Due, Education has been provided regarding the importance of this vaccine. Advised may receive this vaccine at local pharmacy or Health Dept. Aware to provide a copy of the vaccination record if obtained from local pharmacy or Health Dept. Verbalized acceptance and understanding.  Flu Vaccine status: Due, Education has been provided regarding the importance of this vaccine. Advised may receive this vaccine at local pharmacy or Health Dept. Aware to provide a copy of the vaccination record if obtained from local pharmacy or Health Dept. Verbalized acceptance and understanding.  Pneumococcal vaccine status: Due, Education has been provided regarding the importance of this vaccine. Advised may receive this vaccine at local pharmacy or Health Dept. Aware to provide a copy  of the vaccination record if obtained from local pharmacy or Health Dept. Verbalized acceptance and understanding.  Covid-19 vaccine status: Completed vaccines  Qualifies for Shingles Vaccine? Yes   Zostavax completed Yes   Shingrix Completed?: No.    Education has been provided regarding the importance of this vaccine. Patient has been advised to call insurance company to determine out of pocket expense if they have not yet received this vaccine. Advised may also receive vaccine at local pharmacy or Health Dept. Verbalized acceptance and understanding.  Screening Tests Health Maintenance  Topic Date Due   Pneumonia Vaccine 66+ Years old (1 - PCV) Never done   COVID-19 Vaccine (4 - Booster for Pfizer series) 04/04/2020   INFLUENZA VACCINE  11/14/2020   Zoster Vaccines- Shingrix (2 of 2) 01/09/2021   TETANUS/TDAP  11/14/2021 (Originally 09[birth year]09/1973)   Hepatitis C Screening  11/14/2021 (Originally 09[birth year]09/1972)   Fecal DNA (Cologuard)  11/14/2021 (Originally 09/09/2004)   MAMMOGRAM  07/05/2021   DEXA SCAN  Completed   HPV VACCINES  Aged Out    Health Maintenance  Health Maintenance Due  Topic Date Due   Pneumonia Vaccine 27+ Years old (1 - PCV) Never done   COVID-19 Vaccine (4 - Booster for Pfizer series) 04/04/2020   INFLUENZA VACCINE  11/14/2020   Zoster Vaccines- Shingrix (2 of 2) 01/09/2021    Colorectal Cancer Screening: She has Cologuard, just hasn't returned yet - advised to check expiration date and call [employer] for new test kit if needed  Mammogram status: Completed 11/07/2020. Repeat every year  Bone Density status: Completed 05/13/2020. Results reflect: Bone density results: NORMAL. Repeat every 5 years.  Lung Cancer Screening: (Low Dose CT Chest recommended if Age 63-80 years, 30 pack-year currently smoking OR have quit w/in 15years.) does not qualify  Additional Screening:  Hepatitis C Screening: does qualify; Due  Vision Screening: Recommended annual  ophthalmology exams for early detection of glaucoma and other disorders of the eye. Is the patient up to date with their annual eye exam?  Yes  Who is the provider or what is the name of the office in which the patient attends annual eye exams? [facility] If pt is not established with a provider, would they like to be referred to a provider to establish care? No .   Dental Screening: Recommended annual dental exams for proper oral hygiene  Community Resource Referral / Chronic Care Management: CRR required this visit?  No   CCM required this visit?  No      Plan:     I have personally reviewed and noted the following in the patient's chart:   Medical and social history Use of alcohol, tobacco or illicit drugs  Current medications and supplements including opioid prescriptions.  Functional ability and status Nutritional status Physical activity Advanced directives List of other physicians Hospitalizations, surgeries, and ER visits in previous 12 months Vitals Screenings to include cognitive, depression, and falls Referrals and appointments  In addition, I have reviewed and discussed with patient certain preventive protocols, quality metrics, and best practice recommendations. A written personalized care plan for preventive services as well as general preventive health recommendations were provided to patient.     Sandrea Hammond, LPN   35/10/175   Nurse Notes: None

## 2021-02-22 ENCOUNTER — Encounter: Payer: Self-pay | Admitting: Family Medicine

## 2021-03-21 ENCOUNTER — Encounter: Payer: Self-pay | Admitting: Family Medicine

## 2021-03-30 ENCOUNTER — Encounter: Payer: Self-pay | Admitting: Family Medicine

## 2021-03-31 ENCOUNTER — Other Ambulatory Visit: Payer: Self-pay | Admitting: Family Medicine

## 2021-03-31 DIAGNOSIS — F3289 Other specified depressive episodes: Secondary | ICD-10-CM

## 2021-03-31 MED ORDER — SERTRALINE HCL 50 MG PO TABS: 50.0000 mg | ORAL_TABLET | Freq: Every day | ORAL | 3 refills | Status: DC

## 2021-03-31 NOTE — Telephone Encounter (Signed)
Okay to do 90days?

## 2021-04-24 ENCOUNTER — Telehealth: Payer: Self-pay | Admitting: Family Medicine

## 2021-04-26 ENCOUNTER — Telehealth: Payer: Self-pay

## 2021-04-26 NOTE — Telephone Encounter (Signed)
Rebekah [name],  I just received this message High priority, I have not received a message from patient.    Please advise on scheduling, your first available is mid February.  Thank you  [name], [place], [place], [place] [address] Direct Dial: [phone] [email or web address] Website: [email or web address]

## 2021-04-26 NOTE — Chronic Care Management (AMB) (Signed)
°  Care Management   Note  04/26/2021 Name: Rebekah [name] MRN: [id number] DOB: 1955/12/31  Rebekah [name] is a 66 y.o. year old female who is a primary care patient of Janora Norlander, DO and is actively engaged with the care management team. I reached out to [name] by phone today to assist with re-scheduling a follow up visit with the Pharmacist  Follow up plan: Face to Face appointment with care management team member scheduled for: 07/05/2021  [name], [place], [place], [place] [address] Direct Dial: [phone] [email or web address]: [email or web address]

## 2021-04-26 NOTE — Telephone Encounter (Signed)
Patient called to check on her appt with Almyra Free she left message on 1-9. Please call patient to schedule appt. States she 18 days left on Saxenda and Almyra Free had talked to patient about changing medication.

## 2021-05-15 ENCOUNTER — Encounter: Payer: Self-pay | Admitting: Family Medicine

## 2021-05-15 DIAGNOSIS — Z79899 Other long term (current) drug therapy: Secondary | ICD-10-CM

## 2021-05-15 NOTE — Telephone Encounter (Signed)
Ok to place order for Utox

## 2021-05-15 NOTE — Addendum Note (Signed)
Addended by: Everlean Cherry on: 05/15/2021 04:30 PM   Modules accepted: Orders

## 2021-05-17 ENCOUNTER — Other Ambulatory Visit: Payer: Self-pay | Admitting: Internal Medicine

## 2021-05-17 ENCOUNTER — Encounter: Payer: Self-pay | Admitting: Family Medicine

## 2021-05-17 ENCOUNTER — Ambulatory Visit: Payer: Medicare PPO | Admitting: Family Medicine

## 2021-05-17 VITALS — BP 129/77 | HR 82 | Temp 98.3°F | Ht 61.0 in | Wt 271.0 lb

## 2021-05-17 DIAGNOSIS — E559 Vitamin D deficiency, unspecified: Secondary | ICD-10-CM | POA: Diagnosis not present

## 2021-05-17 DIAGNOSIS — Z1159 Encounter for screening for other viral diseases: Secondary | ICD-10-CM | POA: Diagnosis not present

## 2021-05-17 DIAGNOSIS — Z79899 Other long term (current) drug therapy: Secondary | ICD-10-CM | POA: Diagnosis not present

## 2021-05-17 DIAGNOSIS — I480 Paroxysmal atrial fibrillation: Secondary | ICD-10-CM

## 2021-05-17 DIAGNOSIS — Z6841 Body Mass Index (BMI) 40.0 and over, adult: Secondary | ICD-10-CM

## 2021-05-17 DIAGNOSIS — M1990 Unspecified osteoarthritis, unspecified site: Secondary | ICD-10-CM | POA: Diagnosis not present

## 2021-05-17 DIAGNOSIS — Z7901 Long term (current) use of anticoagulants: Secondary | ICD-10-CM

## 2021-05-17 DIAGNOSIS — E034 Atrophy of thyroid (acquired): Secondary | ICD-10-CM

## 2021-05-17 DIAGNOSIS — Z23 Encounter for immunization: Secondary | ICD-10-CM | POA: Diagnosis not present

## 2021-05-17 DIAGNOSIS — E7849 Other hyperlipidemia: Secondary | ICD-10-CM | POA: Diagnosis not present

## 2021-05-17 LAB — BAYER DCA HB A1C WAIVED: HB A1C (BAYER DCA - WAIVED): 5.3 % (ref 4.8–5.6)

## 2021-05-17 MED ORDER — TRAMADOL HCL 50 MG PO TABS: 50.0000 mg | ORAL_TABLET | Freq: Four times a day (QID) | ORAL | 2 refills | Status: DC | PRN

## 2021-05-17 NOTE — Progress Notes (Signed)
Subjective: CC: Arthritis, hypothyroidism, hyperlipidemia PCP: Janora Norlander, DO NFA:OZHY E Karis is a 66 y.o. female presenting to clinic today for:  1.  Morbid obesity associated with hypertension, hyperlipidemia Patient is not currently treated with any cholesterol-lowering medications.  She is compliant with her medications for atrial fibrillation including her blood thinner.  She is compliant with losartan.  Blood pressures typically run 115-140s over 60s to 70s at home.  No reports of heart palpitations, chest pain or shortness of breath.  She is actively working on weight loss and is currently being treated with [name] and a Noom coach.  She really finds this to be helpful but feels like Kirke Shaggy may be interfering with her ability to eat the calories that are recommended by her coach.  She denies any GI discomfort but sometimes does become nauseated.  2.  Hypothyroidism Patient is compliant with her Synthroid 50 mcg daily.  No reports of tremor, heart palpitations, change in voice.  3.  Polyarthritis Patient is interested in perhaps starting some arthritis gloves when she does activities.  She often will have increased pain and muscle fatigability after doing physical activities with her hands.  She takes tramadol very sparingly for joint pain and needs a refill on this today.  Cannot use oral NSAIDs secondary to chronic anticoagulation but has Tylenol on hand and often reaches for this before she will use tramadol.  No constipation reported with the tramadol.  ROS: Per HPI  Allergies  Allergen Reactions   Beef-Derived Products Anaphylaxis    After tick bite, cannot eat beef, pork or lamb   Lambs Quarters Anaphylaxis    After tick bite cannot eat beef, pork or lamb   Mucinex [Guaifenesin Er] Anaphylaxis   Pork-Derived Products Anaphylaxis    After tick bite cannot eat beef, pork or lamb   Darvon [Propoxyphene] Other (See Comments)    Hallucinations    Ace Inhibitors  Swelling and Cough    Pedal Edema   Ppd [Tuberculin Purified Protein Derivative]     Always has positive testing to PPD, do not use   Hydromet [Hydrocodone Bit-Homatrop Mbr] Itching and Other (See Comments)    Severe stomach pain-face itches.    Sulfonamide Derivatives Rash   Past Medical History:  Diagnosis Date   Allergy    Multiple   Anxiety    Arthritis    Bilateral bunions    Cataract    Both eyes- lenses replaced   Chronic kidney disease 2022   Not kidney disease but kidney stone   Complication of anesthesia    Depression    More anxiety-worry-stress   Difficulty sleeping    prior sleep study did not reveal sleep apnea per patient   DJD (degenerative joint disease)    Dyspnea    Dysrhythmia    a-fib   Environmental allergies    Food allergy    Gallbladder problem    GERD (gastroesophageal reflux disease)    Heart murmur    History of kidney stones    Hypertension    no meds after weight loss   Hypothyroidism    Incontinence of urine    at nite    Joint pain    Left atrial dilatation    Left foot pain    Leg edema    Obesity    s/p gastric sleeve 12/2013 (previously weighed close to 400 lbs)   Osteoarthritis    Oxygen deficiency    3L/min for sleep due to slowed reaperations  during sleep   Palpitations    Paroxysmal atrial fibrillation (HCC)    Pneumonia    hx of    PONV (postoperative nausea and vomiting)    Status post bilateral knee replacements    Supplemental oxygen dependent    uses 2l/[place] at night, states HR goes low and O2 drops   Tuberculosis    had 6 month of INH due to exposure    Vaginal vault prolapse     Current Outpatient Medications:    acetaminophen (TYLENOL) 500 MG tablet, Take 500-1,000 mg by mouth every 6 (six) hours as needed for moderate pain or headache., Disp: , Rfl:    albuterol (VENTOLIN HFA) 108 (90 Base) MCG/ACT inhaler, Inhale 2 puffs into the lungs every 6 (six) hours as needed., Disp: 18 g, Rfl: 2   apixaban (ELIQUIS) 5  MG TABS tablet, Take 1 tablet (5 mg total) by mouth 2 (two) times daily. Resume in 48 hours or when no longer having blood in the urine, Disp: 180 tablet, Rfl: 3   cetirizine (ZYRTEC) 10 MG tablet, Take 10 mg by mouth daily., Disp: , Rfl:    Cholecalciferol (VITAMIN D) 50 MCG (2000 UT) CAPS, Take 2,000 Units by mouth daily., Disp: , Rfl:    diltiazem (CARDIZEM CD) 120 MG 24 hr capsule, TAKE 1 CAPSULE BY MOUTH ONCE DAILY (Patient taking differently: Take 120 mg by mouth daily.), Disp: 90 capsule, Rfl: 3   diltiazem (CARDIZEM) 30 MG tablet, TAKE ONE TO TWO TABLETS BY MOUTH EVERY 6 HOURS AS NEEDED FOR  FAST  HEART  RATES, Disp: 30 tablet, Rfl: 1   EPINEPHRINE 0.3 mg/0.3 mL IJ SOAJ injection, inject 0.62ms into the muscle once as needed for anaphylaxis, Disp: 2 each, Rfl: 3   flecainide (TAMBOCOR) 100 MG tablet, Take 3 tablets by mouth at onset of fast heart rate as needed (Patient taking differently: Take 300 mg by mouth daily as needed (onset of afib).), Disp: 30 tablet, Rfl: 1   furosemide (LASIX) 20 MG tablet, TAKE 1 TABLET BY MOUTH ONCE DAILY (Patient taking differently: Take 20 mg by mouth daily.), Disp: 90 tablet, Rfl: 3   Insulin Pen Needle 32G X 6 MM MISC, Use to inject Saxenda daily as directed, Disp: 100 each, Rfl: 3   levothyroxine (SYNTHROID) 50 MCG tablet, TAKE ONE TABLET BY MOUTH DAILY BEFORE BREAKFAST, Disp: 90 tablet, Rfl: 2   Liraglutide -Weight Management (SAXENDA) 18 MG/3ML SOPN, Inject 3 mg into the skin daily. Patient would like 90 day supply., Disp: 45 mL, Rfl: 3   losartan (COZAAR) 50 MG tablet, Take 1 tablet (50 mg total) by mouth 2 (two) times daily., Disp: 180 tablet, Rfl: 3   mirabegron ER (MYRBETRIQ) 25 MG TB24 tablet, Take 1 tablet (25 mg total) by mouth daily. From urologist, Disp: 30 tablet, Rfl: 0   Multiple Vitamins-Minerals (MULTIVITAMIN ADULT PO), Take 1 capsule by mouth daily., Disp: , Rfl:    OXYGEN, Inhale 3 L/min into the lungs at bedtime. Continuously, Disp: , Rfl:     sertraline (ZOLOFT) 50 MG tablet, Take 1 tablet (50 mg total) by mouth daily., Disp: 90 tablet, Rfl: 3   traMADol (ULTRAM) 50 MG tablet, Take 1-2 tablets (50-100 mg total) by mouth every 6 (six) hours as needed for severe pain., Disp: 15 tablet, Rfl: 2 Social History   Socioeconomic History   Marital status: Married    Spouse name: CRobyne Matar  Number of children: 2   Years of education: Not  on file   Highest education level: Bachelor's degree (e.g., BA, AB, BS)  Occupational History   Occupation: special Public relations account executive: OTHER    Comment: Diplomatic Services operational officer. Schools   Occupation: retired Marine scientist - L&D @ [employer]  Tobacco Use   Smoking status: Never   Smokeless tobacco: Never   Tobacco comments:    Tried 1-2 cigarettes as a child. Nothing else.  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   Social Determinants of Health   Financial Resource Strain: Low Risk    Difficulty of Paying Living Expenses: Not very hard  Food Insecurity: No Food Insecurity   Worried About Charity fundraiser in the Last Year: Never true   [place] in the Last Year: Never true  Transportation Needs: No Transportation Needs   Lack of Transportation (Medical): No   Lack of Transportation (Non-Medical): No  Physical Activity: Inactive   Days of Exercise per Week: 0 days   Minutes of Exercise per Session: 0 min  Stress: Stress Concern Present   Feeling of Stress : To some extent  Social Connections: Engineer, building services of Communication with Friends and Family: More than three times a week   Frequency of Social Gatherings with Friends and Family: More  than three times a week   Attends Religious Services: More than 4 times per year   Active Member of [employer] or Organizations: Yes   Attends Music therapist: 1 to 4 times per year   Marital Status: Married  Human resources officer Violence: Not At Risk   Fear of Current or Ex-Partner: No   Emotionally Abused: No   Physically Abused: No   Sexually Abused: No   Family History  Problem Relation Age of Onset   Diabetes Father    Hyperlipidemia Father    Hypertension Father    Heart disease Father    Sudden death Father    Anxiety disorder Father    Obesity Father    Varicose Veins Father    Uterine cancer Mother    Cervical cancer Mother    Breast cancer Mother    Cancer Mother        cervical and brreast cancer   Hypertension Mother    Hyperlipidemia Mother    Obesity Mother    Arthritis Mother    Diabetes Maternal Grandmother    Obesity Maternal Grandmother    Diabetes Brother    Hypertension Brother    Stroke Brother    Hypertension Brother    Obesity Daughter     Objective: Office vital signs reviewed. BP (!) 146/90    Pulse 82    Temp 98.3 F (36.8 C)    Ht 5' 1"  (1.549 m)    Wt 271 lb (122.9 kg)    SpO2 95%    BMI 51.21 kg/m   Physical Examination:  General: Awake, alert, morbidly obese, No acute distress HEENT: Sclera white.  Exophthalmos.  Moist mucous membranes Cardio: regular rate and rhythm, S1S2 heard, no murmurs appreciated Pulm: clear to auscultation bilaterally, no wheezes, rhonchi or rales; normal  work of breathing on room air MSK: Arthritic changes noted throughout bilateral hands.  Ambulating independently.  Assessment/ Plan: 66 y.o. female   Hypothyroidism due to acquired atrophy of thyroid - Plan: TSH, T4, Free  Paroxysmal A-fib (HCC)  Chronic anticoagulation  Other hyperlipidemia - Plan: Lipid Panel  Vitamin D deficiency - Plan: VITAMIN D 25 Hydroxy (Vit-D Deficiency, Fractures)  Controlled substance agreement signed - Plan:  ToxASSURE Select 13 (MW), Urine  Arthritis - Plan: traMADol (ULTRAM) 50 MG tablet  Morbid obesity with BMI of 50.0-59.9, adult ([place]) - Plan: Bayer DCA Hb A1c Waived, CMP14+EGFR  Encounter for hepatitis C screening test for low risk patient - Plan: Hepatitis C antibody  Need for shingles vaccine - Plan: Varicella-zoster vaccine IM (Shingrix)  Need for pneumococcal vaccination - Plan: Pneumococcal conjugate vaccine 20-valent (Prevnar 20)  Check thyroid labs.  Continue current dose of Synthroid for now  Rate and rhythm controlled.  No red flags from a chronic anticoagulation standpoint  Check fasting lipid panel.  Not currently treated with any statins.  Check vitamin D level.  History of vitamin D deficiency currently treated with OTC vitamin D  Controlled substance agreement was completed and urine drug screen was completed as per office policy.  For her arthritis, tramadol was renewed.  The national narcotic database was reviewed and I did see this renewed by Dr. Louis Meckel.  We addressed this during the visit today and this needs to be renewed only in our office as she is under contract.  She voiced understanding and will follow-up accordingly  For her morbid obesity, she is actively working on weight loss.  We will try getting her onto [facility] if she can maintain her [employer] subscription.  I will reach out to Almyra Free to see if this is achievable.  In the meantime.  Check A1c, CMP  Hepatitis C screen, pneumococcal vaccination and shingles vaccination administered today  No orders of the defined types were placed in this encounter.  No orders of the defined types were placed in this encounter.    Janora Norlander, DO [place] [phone]

## 2021-05-18 LAB — LIPID PANEL
Chol/HDL Ratio: 2.7 ratio (ref 0.0–4.4)
Cholesterol, Total: 184 mg/dL (ref 100–199)
HDL: 68 mg/dL (ref >39)
LDL Chol Calc (NIH): 96 mg/dL (ref 0–99)
Triglycerides: 111 mg/dL (ref 0–149)
VLDL Cholesterol Cal: 20 mg/dL (ref 5–40)

## 2021-05-18 LAB — CMP14+EGFR
ALT: 18 IU/L (ref 0–32)
AST: 18 IU/L (ref 0–40)
Albumin/Globulin Ratio: 1.6 (ref 1.2–2.2)
Albumin: 4.7 g/dL (ref 3.8–4.8)
Alkaline Phosphatase: 86 IU/L (ref 44–121)
BUN/Creatinine Ratio: 22 (ref 12–28)
BUN: 21 mg/dL (ref 8–27)
Bilirubin Total: 0.4 mg/dL (ref 0.0–1.2)
CO2: 24 mmol/L (ref 20–29)
Calcium: 10.4 mg/dL — ABNORMAL HIGH (ref 8.7–10.3)
Chloride: 100 mmol/L (ref 96–106)
Creatinine, Ser: 0.96 mg/dL (ref 0.57–1.00)
Globulin, Total: 3 g/dL (ref 1.5–4.5)
Glucose: 105 mg/dL — ABNORMAL HIGH (ref 70–99)
Potassium: 4.5 mmol/L (ref 3.5–5.2)
Sodium: 140 mmol/L (ref 134–144)
Total Protein: 7.7 g/dL (ref 6.0–8.5)
eGFR: 65 mL/min/{1.73_m2} (ref >59)

## 2021-05-18 LAB — VITAMIN D 25 HYDROXY (VIT D DEFICIENCY, FRACTURES): Vit D, 25-Hydroxy: 47.6 ng/mL (ref 30.0–100.0)

## 2021-05-18 LAB — TSH: TSH: 2.88 u[IU]/mL (ref 0.450–4.500)

## 2021-05-18 LAB — T4, FREE: Free T4: 1.26 ng/dL (ref 0.82–1.77)

## 2021-05-18 LAB — HEPATITIS C ANTIBODY: Hep C Virus Ab: <0.1 s/co ratio (ref 0.0–0.9)

## 2021-05-23 LAB — TOXASSURE SELECT 13 (MW), URINE

## 2021-06-14 DIAGNOSIS — N281 Cyst of kidney, acquired: Secondary | ICD-10-CM | POA: Diagnosis not present

## 2021-06-14 DIAGNOSIS — R351 Nocturia: Secondary | ICD-10-CM | POA: Diagnosis not present

## 2021-06-14 DIAGNOSIS — R310 Gross hematuria: Secondary | ICD-10-CM | POA: Diagnosis not present

## 2021-06-14 DIAGNOSIS — M5136 Other intervertebral disc degeneration, lumbar region: Secondary | ICD-10-CM | POA: Diagnosis not present

## 2021-06-14 DIAGNOSIS — D259 Leiomyoma of uterus, unspecified: Secondary | ICD-10-CM | POA: Diagnosis not present

## 2021-06-14 DIAGNOSIS — N200 Calculus of kidney: Secondary | ICD-10-CM | POA: Diagnosis not present

## 2021-06-27 IMAGING — MG MM DIGITAL DIAGNOSTIC UNILAT*L* W/ TOMO W/ CAD
6 series · 6 of 18 positions shown · non-contrast
Comparison: Previous exam(s).

CLINICAL DATA: Patient recalled from screening for left breast
asymmetry.

EXAM:
DIGITAL DIAGNOSTIC UNILATERAL LEFT MAMMOGRAM WITH TOMOSYNTHESIS AND
CAD
TECHNIQUE: Left digital diagnostic mammography and breast tomosynthesis was
performed. The images were evaluated with computer-aided detection.

[L ML synth-2D]
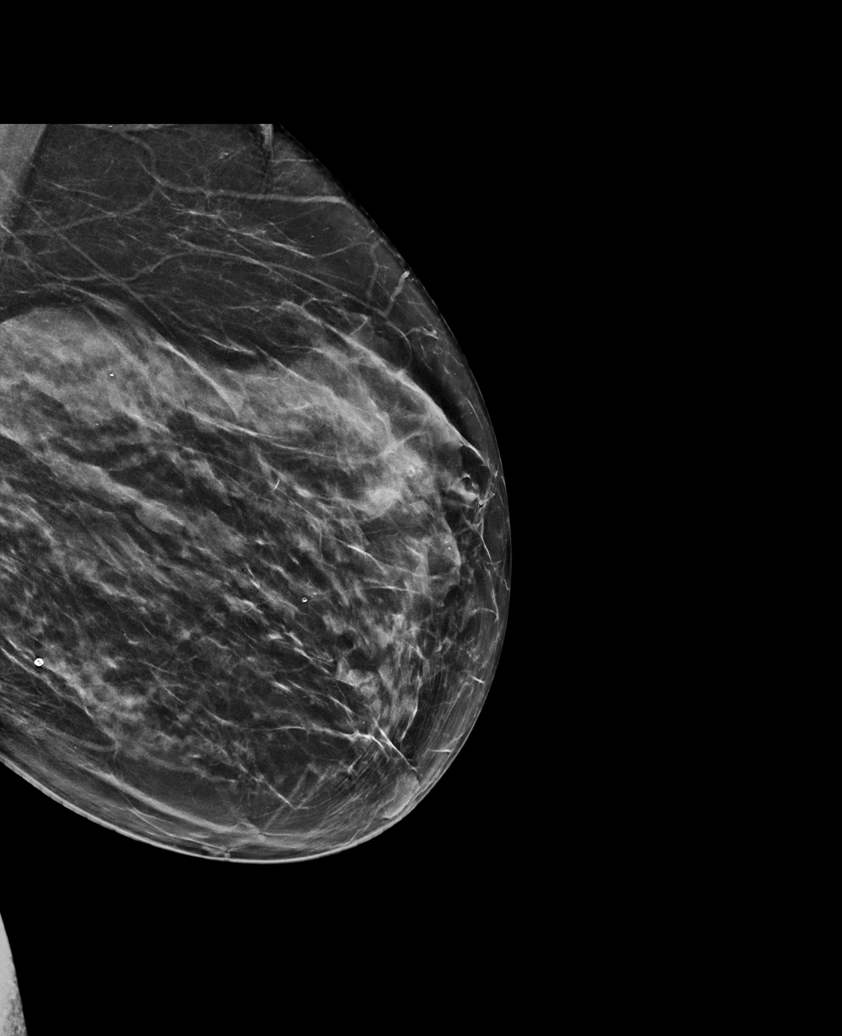

[L MLO synth-2D (1 of 2)]
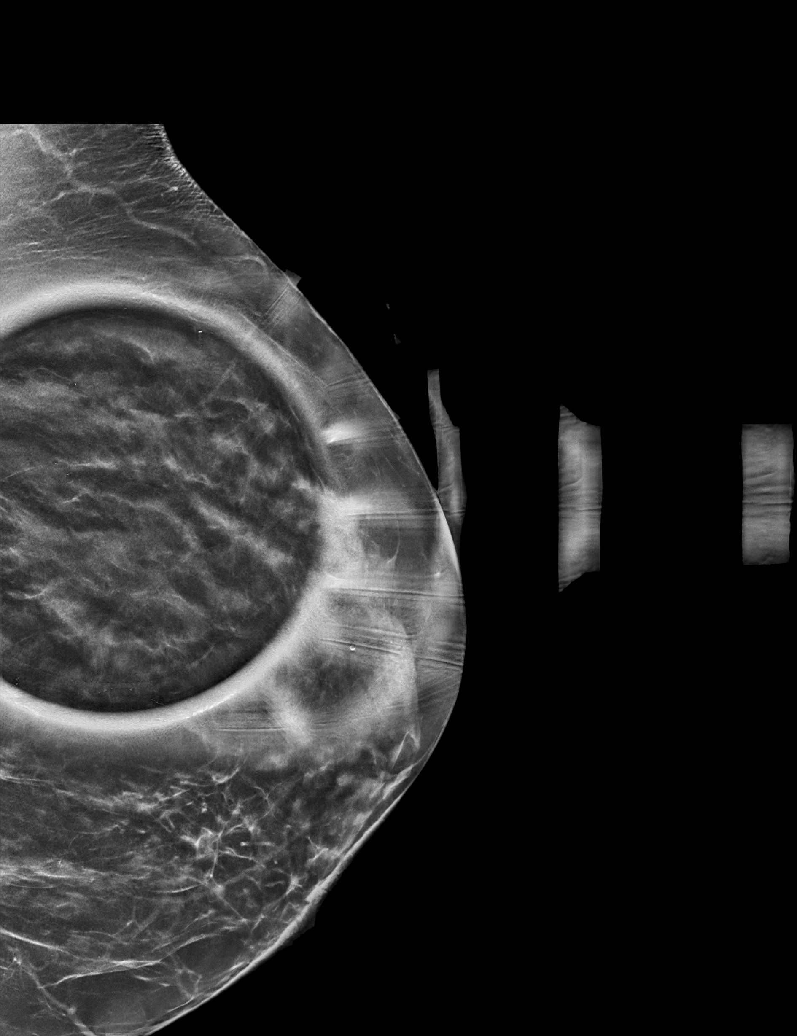

[L MLO synth-2D (2 of 2)]
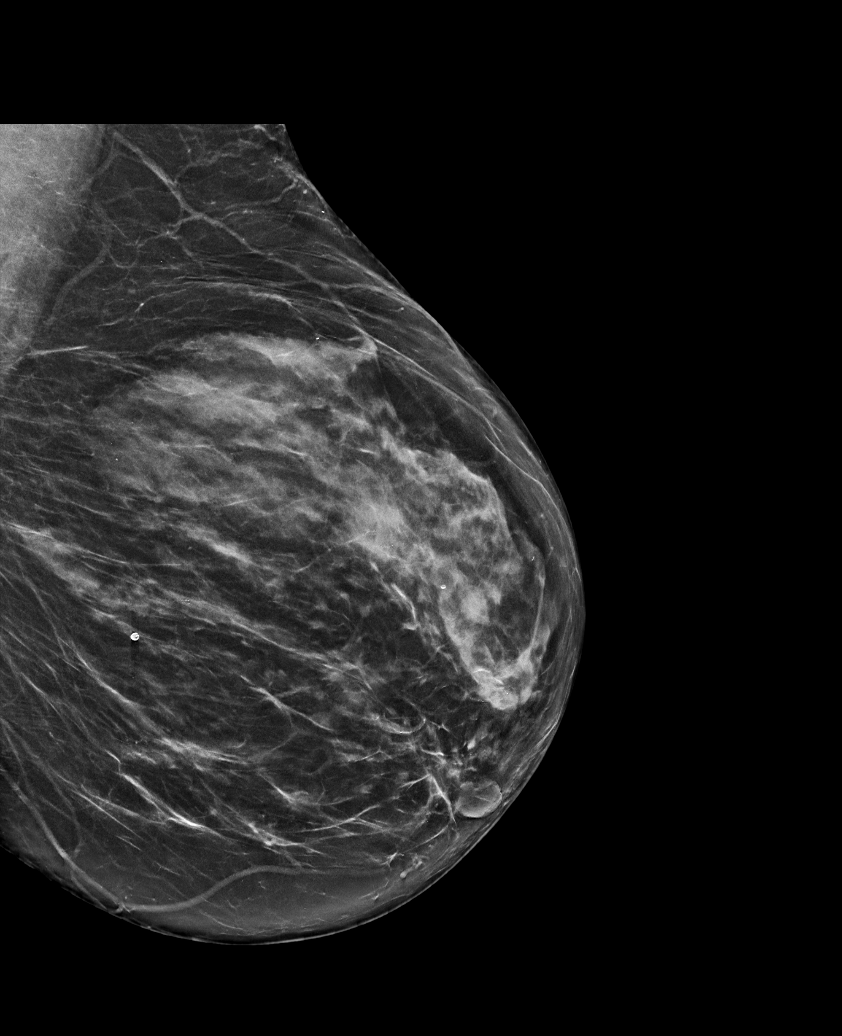

[L MLO tomo (1 of 2) · tomo slice 37/72.0]
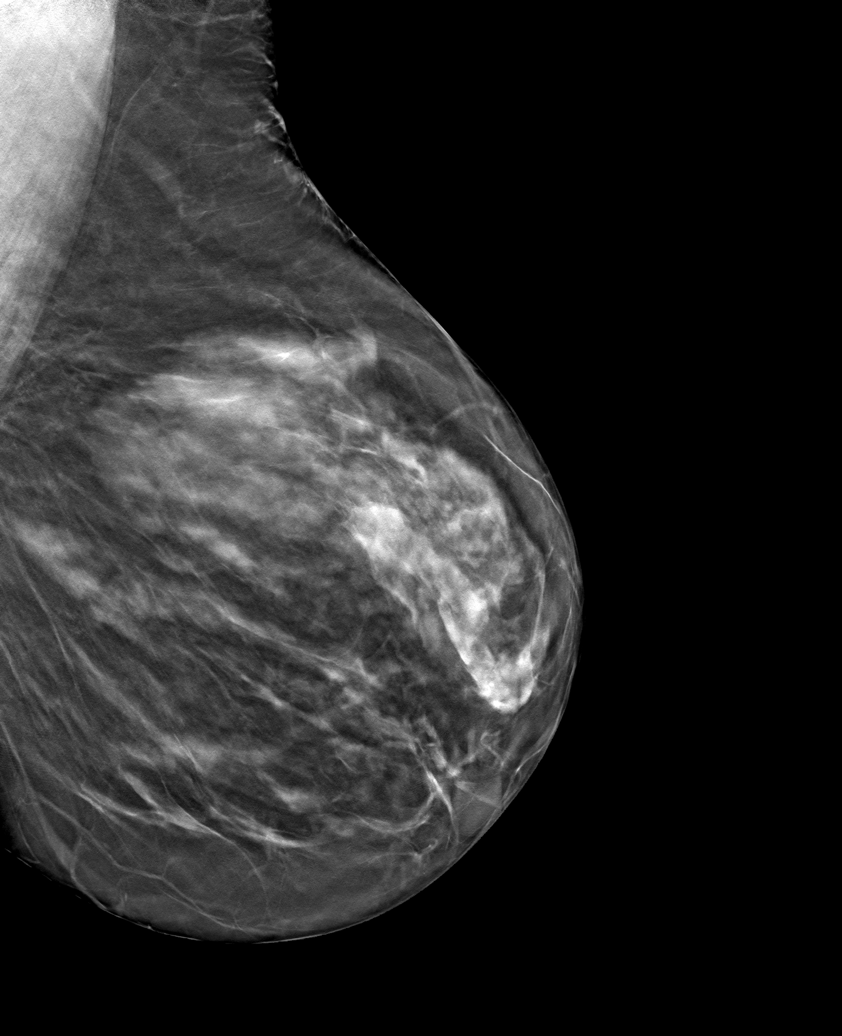

[L ML tomo · tomo slice 38/75.0]
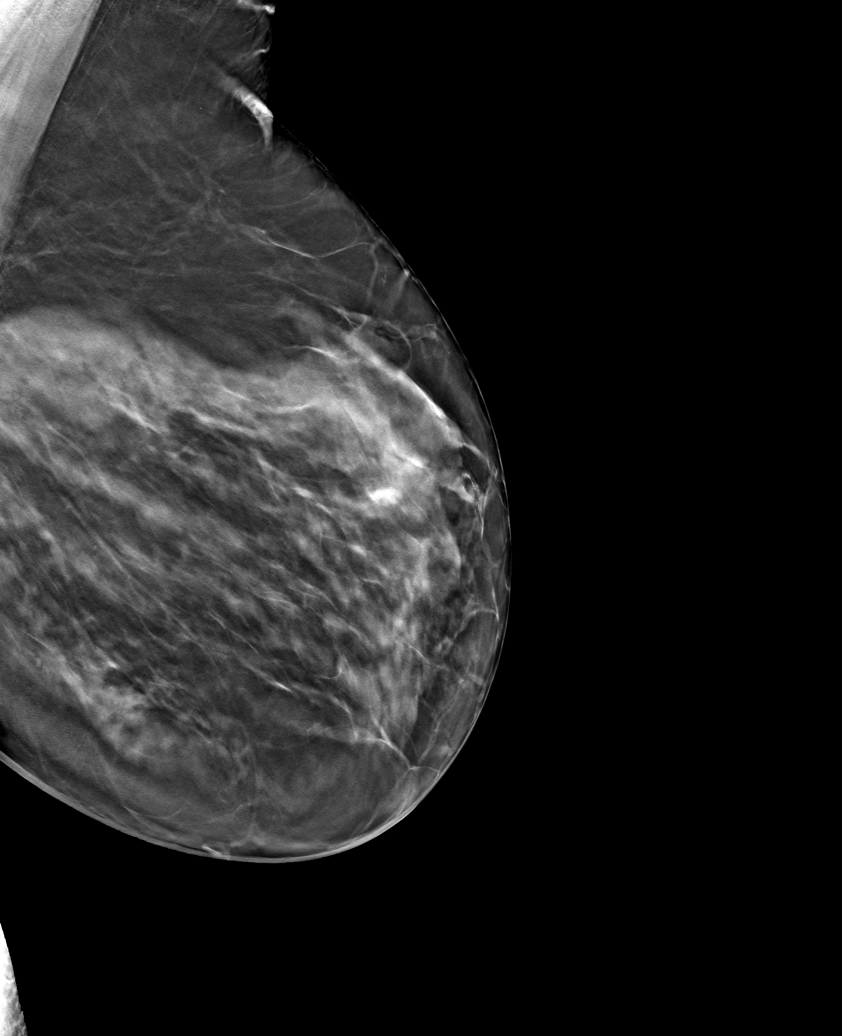

[L MLO tomo (2 of 2) · tomo slice 29/58.0]
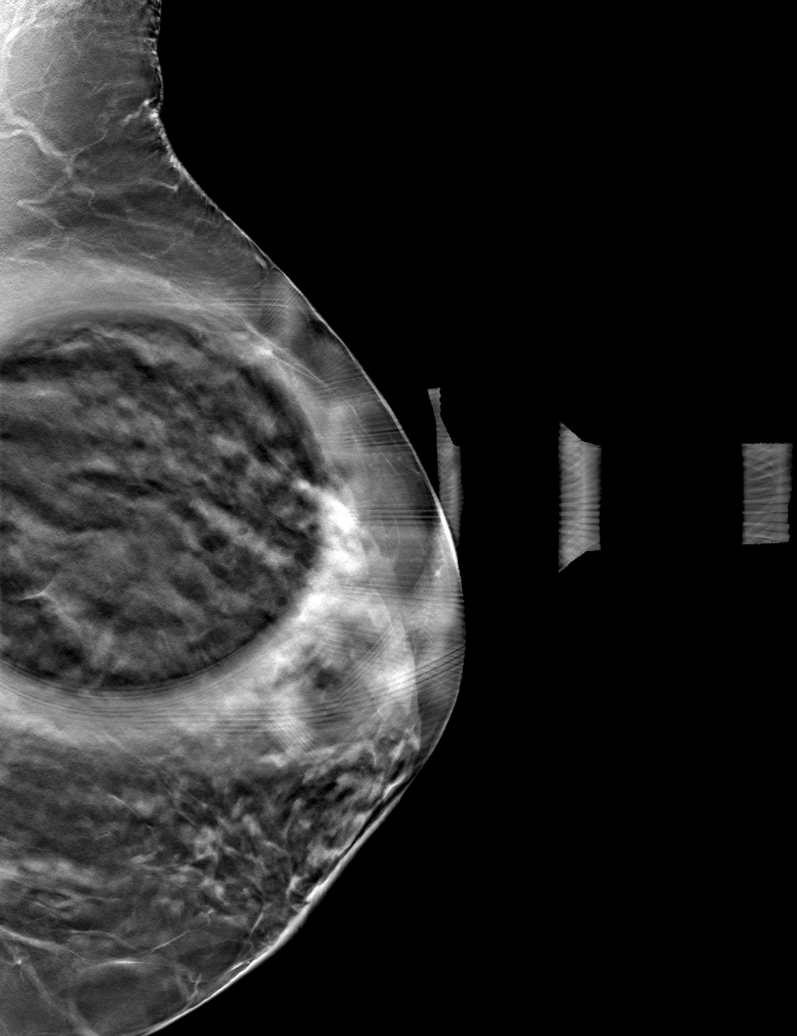

[6 of 18 positions shown; findings below may reference images not displayed]

ACR Breast Density Category c: The breast tissue is heterogeneously
dense, which may obscure small masses.
FINDINGS: Questioned asymmetry within the left breast resolved with additional
imaging compatible with dense overlapping fibroglandular tissue. No
suspicious findings on additional imaging.
IMPRESSION: No mammographic evidence for malignancy.

RECOMMENDATION:
Screening mammogram in one year.(Code:[id number])

I have discussed the findings and recommendations with the patient.
If applicable, a reminder letter will be sent to the patient
regarding the next appointment.

BI-RADS CATEGORY  1: Negative.

## 2021-07-05 ENCOUNTER — Ambulatory Visit: Payer: Medicare PPO | Admitting: Pharmacist

## 2021-07-05 VITALS — Wt 269.7 lb

## 2021-07-05 DIAGNOSIS — Z6841 Body Mass Index (BMI) 40.0 and over, adult: Secondary | ICD-10-CM

## 2021-07-05 DIAGNOSIS — E7849 Other hyperlipidemia: Secondary | ICD-10-CM | POA: Diagnosis not present

## 2021-07-05 DIAGNOSIS — I482 Chronic atrial fibrillation, unspecified: Secondary | ICD-10-CM

## 2021-07-05 NOTE — Patient Instructions (Signed)
Visit Information ? ?Following are the goals we discussed today:  ?Current Barriers:  ?Unable to achieve control of obesity  ? ?Pharmacist Clinical Goal(s):  ?patient will achieve control of weight as evidenced by decreased weight through collaboration with PharmD and provider.  ? ? ?Interventions: ?1:1 collaboration with Janora Norlander, DO regarding development and update of comprehensive plan of care as evidenced by provider attestation and co-signature ?Inter-disciplinary care team collaboration (see longitudinal plan of care) ?Comprehensive medication review performed; medication list updated in electronic medical record ? ?Overweight/Obesity Complicated by hyperlipidemia :  New goal. ?Unable to achieve goal weight loss through lifestyle modification alone; current treatment:n/a--continue Saxenda (may transition to wegovy per patient preference);  ?Medications/Strategies previously tried: ?Status post gastric sleeve in 2015 ?Working with healthy weight management center (Dr. Leafy Ro) through [facility] ?She is eating less than 1,000 kcal per day but unable to lose weight on her own ?Most recent weight: 267 lbs (was 298lb in 12/2021); current BMI 50.3 ?Current exercise: n/a ?Patient has received extensive dietary counseling including education on focus on lean proteins, fruits and vegetables, whole grains and increased fiber consumption, adequate hydration via health weight management center ?Extensive exercise counseling including eventual goal of 150 minutes of moderate intensity exercise weekly (as able) ?Educated on potential transition to wegovy ?Denies personal and family history of Medullary thyroid cancer (MTC) ? ?Patient reports she is tolerating Saxenda well, however she is unable to eat the recommended daily caloric amount.  We discussed potentially reducing Saxenda dose.  We also discussed transitioning to once weekly Wegovy 05.mg-'1mg'$  weekly.  Patient would like additional weight loss.  Limited  physical activity is a barrier.  Patient to decide which course of action she is to take and report back with PCP with decision of saxenda vs wegovy.  I would recommend she see dietician for additional nutritional counseling as needed ? ?TIPS FOR SUCCESS on wegovy/saxenda: ?EAT SMALLER MEALS ?STOP EATING WHEN YOU FEEL FULL ?AVOID FAT & FOODS WITH HIGH SUGAR ?INCREASE YOUR WATER--DRINK AT LEAST HALF OF YOUR WEIGHT IN WATER (OZ) ?LIMIT CARBONATED BEVERAGES ? ?Patient Goals/Self-Care Activities ?patient will:  ?- take medications as prescribed ?collaborate with provider on medication access solutions ? ?Follow Up Plan: The patient has been provided with contact information for the care management team and has been advised to call with any health related questions or concerns.   ? ? ?Plan: Telephone follow up appointment with care management team member scheduled for:  6 months ? ?Signature ?Regina Eck, PharmD, BCPS ?Clinical Pharmacist, [facility] ?[place]  II Phone [phone] ? ? ?Please call the care guide team at [phone] if you need to cancel or reschedule your appointment.  ? ?Patient verbalizes understanding of instructions and care plan provided today and agrees to view in [place]. Active MyChart status confirmed with patient.   ? ?

## 2021-07-05 NOTE — Progress Notes (Signed)
?Cardiology Clinic Note  ? ?Patient Name: Rebekah [name] ?Date of Encounter: 07/07/2021 ? ?Primary Care Provider:  Janora Norlander, DO ?Primary Cardiologist:  Rebekah [name]  ? ?Patient Profile  ?  ?Rebekah [name] is a 66 year old female patient followed by Rebekah [name] for paroxysmal atrial fibrillation, recurrent on pocket pill therapy, chronic bilateral leg edema, morbid obesity, hypertension.  Was last seen in the office on 05/17/2020 with blood pressure controlled, planned bariatric surgery if weight loss on her own was not successful.  She remains on Xarelto for anticoagulation. ? ?Past Medical History  ?  ?Past Medical History:  ?Diagnosis Date  ? Allergy   ? Multiple  ? Anxiety   ? Arthritis   ? Bilateral bunions   ? Cataract   ? Both eyes- lenses replaced  ? Chronic kidney disease 2022  ? Not kidney disease but kidney stone  ? Complication of anesthesia   ? Depression   ? More anxiety-worry-stress  ? Difficulty sleeping   ? prior sleep study did not reveal sleep apnea per patient  ? DJD (degenerative joint disease)   ? Dyspnea   ? Dysrhythmia   ? a-fib  ? Environmental allergies   ? Food allergy   ? Gallbladder problem   ? GERD (gastroesophageal reflux disease)   ? Heart murmur   ? History of kidney stones   ? Hypertension   ? no meds after weight loss  ? Hypothyroidism   ? Incontinence of urine   ? at nite   ? Joint pain   ? Left atrial dilatation   ? Left foot pain   ? Leg edema   ? Obesity   ? s/p gastric sleeve 12/2013 (previously weighed close to 400 lbs)  ? Osteoarthritis   ? Oxygen deficiency   ? 3L/min for sleep due to slowed reaperations during sleep  ? Palpitations   ? Paroxysmal atrial fibrillation (HCC)   ? Pneumonia   ? hx of   ? PONV (postoperative nausea and vomiting)   ? Status post bilateral knee replacements   ? Supplemental oxygen dependent   ? uses 2l/[place] at night, states HR goes low and O2 drops  ? Tuberculosis   ? had 6 month of INH due to exposure   ? Vaginal vault prolapse   ? ?Past Surgical  History:  ?Procedure Laterality Date  ? APPENDECTOMY    ? CHOLECYSTECTOMY    ? CYSTOSCOPY/URETEROSCOPY/HOLMIUM LASER/STENT PLACEMENT Left 09/29/2020  ? Procedure: CYSTOSCOPY WITH LEFT RETROGRADE PYELOGRAM AND LEFT URETEROSCOPY/HOLMIUM LASER STONE REMOVAL LEFT /STENT PLACEMENT;  Surgeon: Ardis Hughs, MD;  Location: [facility];  Service: Urology;  Laterality: Left;  ? EYE SURGERY    ? 03/2014 lens implant left lens   ? FOOT ARTHRODESIS Left 03/15/2016  ? Procedure: TALONAVICULAR AND SUBTALAR ARTHRODESIS;  Surgeon: Wylene Simmer, MD;  Location: [place];  Service: Orthopedics;  Laterality: Left;  ? GASTROC RECESSION EXTREMITY Left 03/15/2016  ? Procedure: LEFT GASTROC RECESSION;  Surgeon: Wylene Simmer, MD;  Location: [place];  Service: Orthopedics;  Laterality: Left;  ? JOINT REPLACEMENT  04/16/1997  ? L TOTAL KNEE  ? LAPAROSCOPIC GASTRIC SLEEVE RESECTION N/A 01/04/2014  ? Procedure: LAPAROSCOPIC GASTRIC SLEEVE RESECTION;  Surgeon: Greer Pickerel, MD;  Location: [facility];  Service: General;  Laterality: N/A;  ? TONSILLECTOMY    ? TOTAL KNEE ARTHROPLASTY Right 05/17/2015  ? Procedure: RIGHT TOTAL KNEE ARTHROPLASTY;  Surgeon: Paralee Cancel, MD;  Location: [facility];  Service: Orthopedics;  Laterality: Right;  ? TRANSTHORACIC ECHOCARDIOGRAM  11/14/2012  ? EF 60-65%, mod LVH & mod conc hypertrophy, grade 1 diastolic dysfunction; LA mildly dilated; RV systolic pressure increased; PA peak pressure 62mHg  ? TUBAL LIGATION    ? UPPER GI ENDOSCOPY  01/04/2014  ? Procedure: UPPER GI ENDOSCOPY;  Surgeon: EGreer Pickerel MD;  Location: [facility];  Service: General;;  ? ? ?Allergies ? ?Allergies  ?Allergen Reactions  ? Beef-Derived Products Anaphylaxis  ?  After tick bite, cannot eat beef, pork or lamb  ? Lambs Quarters Anaphylaxis  ?  After tick bite cannot eat beef, pork or lamb  ? Mucinex [Guaifenesin Er] Anaphylaxis  ? Pork-Derived Products Anaphylaxis  ?  After tick bite cannot eat beef, pork or lamb  ?  Darvon [Propoxyphene] Other (See Comments)  ?  Hallucinations ?  ? Ace Inhibitors Swelling and Cough  ?  Pedal Edema  ? Ppd [Tuberculin Purified Protein Derivative]   ?  Always has positive testing to PPD, do not use  ? Hydromet [Hydrocodone Bit-Homatrop Mbr] Itching and Other (See Comments)  ?  Severe stomach pain-face itches.   ? Sulfonamide Derivatives Rash  ? ? ?History of Present Illness  ?  ?Rebekah [name] to the office today with above-mentioned medical history.  She is being followed for paroxysmal atrial fibrillation, is on Xarelto for anticoagulation.  She was continuing to work on weight loss and is being treated with [place] is following [place] weight loss regimen through a coach.  She was last seen by primary care physician and complained of polyarthritis especially in her hands and was offered gloves to help her during activities.  She denied any bleeding issues or frequent palpitations. ? ?She is here today with her husband.  She states that she continues to have rapid heart rhythm at least 3 times a month, requiring her to take "pill in a pocket" with flecainide 300 mg.  If she has a repeat of the rapid heart rate within 4 to 5 days, she will not take an additional pill in the pocket but will take an additional diltiazem instead.  She states this has been ongoing for the last few months.  On last office visit with Dr. HDebara [name] there was consideration to have her on daily dose of flecainide if she continued to need "pill in the pocket" intervention for breakthrough rapid heart rhythm.  She reports that she becomes very tired and fatigued if she remains in irregular heart rhythm for several hours, when she returns to normal sinus rhythm. ? ?She continues to complain of significant arthritis pain in her hands.  And on rare occasions will take an Aleve tablet to help her with pain control although she does not regularly take this otherwise.  She denies any bleeding, hemoptysis, or significant  bruising on Eliquis. ? ?Home Medications  ?  ?Current Outpatient Medications  ?Medication Sig Dispense Refill  ? acetaminophen (TYLENOL) 500 MG tablet Take 500-1,000 mg by mouth every 6 (six) hours as needed for moderate pain or headache.    ? albuterol (VENTOLIN HFA) 108 (90 Base) MCG/ACT inhaler Inhale 2 puffs into the lungs every 6 (six) hours as needed. 18 g 2  ? apixaban (ELIQUIS) 5 MG TABS tablet Take 1 tablet (5 mg total) by mouth 2 (two) times daily. Resume in 48 hours or when no longer having blood in the urine 180 tablet 3  ? cetirizine (ZYRTEC) 10 MG tablet Take 10 mg by mouth daily.    ?  Cholecalciferol (VITAMIN D) 50 MCG (2000 UT) CAPS Take 2,000 Units by mouth daily.    ? diltiazem (CARDIZEM CD) 120 MG 24 hr capsule TAKE 1 CAPSULE BY MOUTH ONCE DAILY (Patient taking differently: Take 120 mg by mouth daily.) 90 capsule 3  ? diltiazem (CARDIZEM) 30 MG tablet TAKE ONE TO TWO TABLETS BY MOUTH EVERY 6 HOURS AS NEEDED FOR  FAST  HEART  RATES 30 tablet 1  ? EPINEPHRINE 0.3 mg/0.3 mL IJ SOAJ injection inject 0.57ms into the muscle once as needed for anaphylaxis 2 each 3  ? flecainide (TAMBOCOR) 100 MG tablet Take 3 tablets by mouth at onset of fast heart rate as needed (Patient taking differently: Take 300 mg by mouth daily as needed (onset of afib).) 30 tablet 1  ? flecainide (TAMBOCOR) 50 MG tablet Take 50 mg by mouth 2 (two) times daily. Take 1 Tablet Twice Daily. For Rapid heart Rate Take Additional Tablet As needed.    ? furosemide (LASIX) 20 MG tablet TAKE 1 TABLET BY MOUTH ONCE DAILY (Patient taking differently: Take 20 mg by mouth daily.) 90 tablet 3  ? Insulin Pen Needle 32G X 6 MM MISC Use to inject Saxenda daily as directed 100 each 3  ? levothyroxine (SYNTHROID) 50 MCG tablet TAKE ONE TABLET BY MOUTH DAILY BEFORE BREAKFAST 90 tablet 2  ? Liraglutide -Weight Management (SAXENDA) 18 MG/3ML SOPN Inject 3 mg into the skin daily. Patient would like 90 day supply. 45 mL 3  ? losartan (COZAAR) 50 MG  tablet Take 1 tablet (50 mg total) by mouth 2 (two) times daily. 180 tablet 3  ? Multiple Vitamins-Minerals (MULTIVITAMIN ADULT PO) Take 1 capsule by mouth daily.    ? OXYGEN Inhale 3 L/min into the lungs at bedtime. C

## 2021-07-05 NOTE — Progress Notes (Signed)
? ? ?Chronic Care Management ?Pharmacy Note ?07/05/2021 ?Name:  Rebekah [name] MRN:  [id number] DOB:  [birth year] ? ?Summary: ? ?Overweight/Obesity Complicated by hyperlipidemia :  New goal. ?Unable to achieve goal weight loss through lifestyle modification alone; current treatment:n/a--continue Saxenda (may transition to wegovy per patient preference);  ?Medications/Strategies previously tried: ?Status post gastric sleeve in 2015 ?Working with healthy weight management center (Dr. Leafy Ro) through [facility] ?She is eating less than 1,000 kcal per day but unable to lose weight on her own ?Most recent weight: 267 lbs (was 298lb in 12/2021); current BMI 50.3 ?Current exercise: n/a ?Patient has received extensive dietary counseling including education on focus on lean proteins, fruits and vegetables, whole grains and increased fiber consumption, adequate hydration via health weight management center ?Extensive exercise counseling including eventual goal of 150 minutes of moderate intensity exercise weekly (as able) ?Educated on potential transition to wegovy ?Denies personal and family history of Medullary thyroid cancer (MTC) ? ?Patient reports she is tolerating Saxenda well, however she is unable to eat the recommended daily caloric amount.  We discussed potentially reducing Saxenda dose.  We also discussed transitioning to once weekly Wegovy 05.mg-56m weekly.  Patient would like additional weight loss.  Limited physical activity is a barrier.  Patient to decide which course of action she is to take and report back with PCP with decision of saxenda vs wegovy.  I would recommend she see dietician for additional nutritional counseling as needed ? ?TIPS FOR SUCCESS on wegovy/saxenda: ?EAT SMALLER MEALS ?STOP EATING WHEN YOU FEEL FULL ?AVOID FAT & FOODS WITH HIGH SUGAR ?INCREASE YOUR WATER--DRINK AT LEAST HALF OF YOUR WEIGHT IN WATER (OZ) ?LIMIT CARBONATED BEVERAGES ? ?Subjective: ?RTERESE HEIERis an 66y.o. year old female  who is a primary patient of GJanora Norlander DO.  The CCM team was consulted for assistance with disease management and care coordination needs.   ? ?Engaged with patient face to face for follow up visit in response to provider referral for pharmacy case management and/or care coordination services.  ? ?Consent to Services:  ?The patient was given information about Chronic Care Management services, agreed to services, and gave verbal consent prior to initiation of services.  Please see initial visit note for detailed documentation.  ? ?Patient Care Team: ?GJanora Norlander DO as PCP - General (Family Medicine) ?BStarlyn Skeans MD as Consulting Physician (Family Medicine) ?HPixie Casino MD as Consulting Physician (Cardiology) ?RPaula Compton MD as Consulting Physician (Obstetrics and Gynecology) ?PLavera Guise [facility] Pharmacist (Family Medicine) ? ?Objective: ? ?Lab Results  ?Component Value Date  ? CREATININE 0.96 05/17/2021  ? CREATININE 0.88 11/14/2020  ? CREATININE 0.96 09/22/2020  ? ? ?Lab Results  ?Component Value Date  ? HGBA1C 5.3 05/17/2021  ? ?Last diabetic Eye exam:  ?Lab Results  ?Component Value Date/Time  ? HMDIABEYEEXA No Retinopathy 05/20/2019 12:00 AM  ?  ?Last diabetic Foot exam: No results found for: HMDIABFOOTEX  ? ?   ?Component Value Date/Time  ? CHOL 184 05/17/2021 0845  ? TRIG 111 05/17/2021 0845  ? HDL 68 05/17/2021 0845  ? CHOLHDL 2.7 05/17/2021 0845  ? CHOLHDL 4.2 07/02/2013 1301  ? VLDL 43 (H) 07/02/2013 1301  ? LSpencer96 05/17/2021 0845  ? ? ? ?  Latest Ref Rng & Units 05/17/2021  ?  8:45 AM 11/14/2020  ? 11:40 AM 06/30/2020  ? 11:04 AM  ?Hepatic Function  ?Total Protein 6.0 - 8.5 g/dL 7.7  7.5   7.6    ?Albumin 3.8 - 4.8 g/dL 4.7   4.6   4.4    ?AST 0 - 40 IU/L 18   18   17     ?ALT 0 - 32 IU/L 18   18   17     ?Alk Phosphatase 44 - 121 IU/L 86   79   69    ?Total Bilirubin 0.0 - 1.2 mg/dL 0.4   0.4   0.4    ? ? ?Lab Results  ?Component Value Date/Time  ? TSH 2.880  05/17/2021 08:45 AM  ? TSH 2.150 11/14/2020 11:40 AM  ? FREET4 1.26 05/17/2021 08:45 AM  ? FREET4 1.25 11/14/2020 11:40 AM  ? ? ? ?  Latest Ref Rng & Units 09/22/2020  ? 10:34 AM 05/10/2020  ? 10:52 AM 12/18/2018  ?  8:55 AM  ?CBC  ?WBC 4.0 - 10.5 K/uL 8.6   8.4   6.5    ?Hemoglobin 12.0 - 15.0 g/dL 13.0   13.6   13.4    ?Hematocrit 36.0 - 46.0 % 42.8   41.5   41.5    ?Platelets 150 - 400 K/uL 194   202     ? ? ?Lab Results  ?Component Value Date/Time  ? VD25OH 47.6 05/17/2021 08:45 AM  ? VD25OH 62.0 11/14/2020 11:40 AM  ? ? ?Clinical ASCVD: No  ?The 10-year ASCVD risk score (Arnett DK, et al., 2019) is: 13.8% ?  Values used to calculate the score: ?    Age: 66 years ?    Sex: Female ?    Is Non-Hispanic African American: No ?    Diabetic: Yes ?    Tobacco smoker: No ?    Systolic Blood Pressure: 235 mmHg ?    Is BP treated: Yes ?    HDL Cholesterol: 68 mg/dL ?    Total Cholesterol: 184 mg/dL   ? ?Other: (CHADS2VASc if Afib, PHQ9 if depression, MMRC or CAT for COPD, ACT, DEXA) ? ?Social History  ? ?Tobacco Use  ?Smoking Status Never  ?Smokeless Tobacco Never  ?Tobacco Comments  ? Tried 1-2 cigarettes as a child. Nothing else.  ? ?BP Readings from Last 3 Encounters:  ?07/07/21 128/82  ?05/17/21 129/77  ?02/17/21 120/70  ? ?Pulse Readings from Last 3 Encounters:  ?07/07/21 75  ?05/17/21 82  ?11/14/20 84  ? ?Wt Readings from Last 3 Encounters:  ?07/07/21 266 lb 9.6 oz (120.9 kg)  ?07/05/21 269 lb 11.2 oz (122.3 kg)  ?05/17/21 271 lb (122.9 kg)  ? ? ?Assessment: Review of patient past medical history, allergies, medications, health status, including review of consultants reports, laboratory and other test data, was performed as part of comprehensive evaluation and provision of chronic care management services.  ? ?SDOH:  (Social Determinants of Health) assessments and interventions performed:  ? ? ?CCM Care Plan ? ?Allergies  ?Allergen Reactions  ? Beef-Derived Products Anaphylaxis  ?  After tick bite, cannot eat beef, pork  or lamb  ? Lambs Quarters Anaphylaxis  ?  After tick bite cannot eat beef, pork or lamb  ? Mucinex [Guaifenesin Er] Anaphylaxis  ? Pork-Derived Products Anaphylaxis  ?  After tick bite cannot eat beef, pork or lamb  ? Darvon [Propoxyphene] Other (See Comments)  ?  Hallucinations ?  ? Ace Inhibitors Swelling and Cough  ?  Pedal Edema  ? Ppd [Tuberculin Purified Protein Derivative]   ?  Always has positive testing to PPD, do not use  ?  Hydromet [Hydrocodone Bit-Homatrop Mbr] Itching and Other (See Comments)  ?  Severe stomach pain-face itches.   ? Sulfonamide Derivatives Rash  ? ? ?Medications Reviewed Today   ? ? Reviewed by Lavera Guise, [facility] (Pharmacist) on 07/26/21 at [facility] List Status: <None>  ? ?Medication Order Taking? Sig Documenting Provider Last Dose Status Informant  ?acetaminophen (TYLENOL) 500 MG tablet [id number] No Take 500-1,000 mg by mouth every 6 (six) hours as needed for moderate pain or headache. [provider] Taking Active Self  ?albuterol (VENTOLIN HFA) 108 (90 Base) MCG/ACT inhaler [id number] No Inhale 2 puffs into the lungs every 6 (six) hours as needed. Loman Brooklyn, FNP Taking Active   ?apixaban (ELIQUIS) 5 MG TABS tablet [id number] No Take 1 tablet (5 mg total) by mouth 2 (two) times daily. Resume in 48 hours or when no longer having blood in the urine Ardis Hughs, MD Taking Active   ?cetirizine (ZYRTEC) 10 MG tablet [id number] No Take 10 mg by mouth daily. [provider] Taking Active Self  ?Cholecalciferol (VITAMIN D) 50 MCG (2000 UT) CAPS [id number] No Take 2,000 Units by mouth daily. [provider] Taking Active Self  ?diltiazem (CARDIZEM CD) 120 MG 24 hr capsule [id number] No TAKE 1 CAPSULE BY MOUTH ONCE DAILY  ?Patient taking differently: Take 120 mg by mouth daily.  ? Pixie Casino, MD Taking Active   ?diltiazem (CARDIZEM) 30 MG tablet [id number] No TAKE ONE TO TWO TABLETS BY MOUTH EVERY 6 HOURS AS NEEDED FOR  FAST  HEART  RATES Hilty,  Nadean Corwin, MD Taking Active Self  ?EPINEPHRINE 0.3 mg/0.3 mL IJ SOAJ injection [id number] No inject 0.54ms into the muscle once as needed for anaphylaxis GJanora Norlander DO Taking Active   ?flecainide ([facility]

## 2021-07-07 ENCOUNTER — Other Ambulatory Visit: Payer: Self-pay

## 2021-07-07 ENCOUNTER — Encounter: Payer: Self-pay | Admitting: Adult Health

## 2021-07-07 ENCOUNTER — Ambulatory Visit: Payer: Medicare PPO | Admitting: Adult Health

## 2021-07-07 ENCOUNTER — Telehealth: Payer: Self-pay

## 2021-07-07 VITALS — BP 128/82 | HR 75 | Ht 61.0 in | Wt 266.6 lb

## 2021-07-07 DIAGNOSIS — I10 Essential (primary) hypertension: Secondary | ICD-10-CM

## 2021-07-07 DIAGNOSIS — I480 Paroxysmal atrial fibrillation: Secondary | ICD-10-CM

## 2021-07-07 DIAGNOSIS — E039 Hypothyroidism, unspecified: Secondary | ICD-10-CM | POA: Diagnosis not present

## 2021-07-07 MED ORDER — FLECAINIDE ACETATE 50 MG PO TABS: 50.0000 mg | ORAL_TABLET | Freq: Two times a day (BID) | ORAL | 3 refills | Status: DC

## 2021-07-07 NOTE — Addendum Note (Signed)
Addended by: Merri Ray A on: 07/07/2021 04:17 PM ? ? Modules accepted: Orders ? ?

## 2021-07-07 NOTE — Telephone Encounter (Signed)
Left message for patient to advise Prescription sent to pharmacy ?

## 2021-07-07 NOTE — Addendum Note (Signed)
Addended by: Merri Ray A on: 07/07/2021 02:31 PM ? ? Modules accepted: Orders ? ?

## 2021-07-07 NOTE — Patient Instructions (Signed)
Medication Instructions:  ?No Changes ?*If you need a refill on your cardiac medications before your next appointment, please call your pharmacy* ? ? ?Lab Work: ?No labs ?If you have labs (blood work) drawn today and your tests are completely normal, you will receive your results only by: ?MyChart Message (if you have MyChart) OR ?A paper copy in the mail ?If you have any lab test that is abnormal or we need to change your treatment, we will call you to review the results. ? ? ?Testing/Procedures: [address] ?Your physician has requested that you have a lexiscan myoview. For further information please visit [email or web address]. Please follow instruction sheet, as given.  ? ? ?Follow-Up: ?At [facility], you and your health needs are our priority.  As part of our continuing mission to provide you with exceptional heart care, we have created designated Provider Care Teams.  These Care Teams include your primary Cardiologist (physician) and Advanced Practice Providers (APPs -  Physician Assistants and Nurse Practitioners) who all work together to provide you with the care you need, when you need it. ? ?We recommend signing up for the patient portal called "MyChart".  Sign up information is provided on this After Visit Summary.  MyChart is used to connect with patients for Virtual Visits (Telemedicine).  Patients are able to view lab/test results, encounter notes, upcoming appointments, etc.  Non-urgent messages can be sent to your provider as well.   ?To learn more about what you can do with MyChart, go to [email or web address].   ? ?Your next appointment:   ?First Available ? ?The format for your next appointment:   ?In Person ? ?Provider:   ?Lyman Bishop, MD  ? ? ?  ?

## 2021-07-11 NOTE — Addendum Note (Signed)
Addended by: Merri Ray A on: 07/11/2021 08:56 AM ? ? Modules accepted: Orders ? ?

## 2021-07-13 NOTE — Addendum Note (Signed)
Addended by: Lendon Colonel on: 07/13/2021 08:22 AM ? ? Modules accepted: Orders ? ?

## 2021-07-14 ENCOUNTER — Ambulatory Visit (INDEPENDENT_AMBULATORY_CARE_PROVIDER_SITE_OTHER): Payer: Medicare PPO

## 2021-07-14 DIAGNOSIS — Z0289 Encounter for other administrative examinations: Secondary | ICD-10-CM

## 2021-07-14 DIAGNOSIS — I482 Chronic atrial fibrillation, unspecified: Secondary | ICD-10-CM | POA: Diagnosis not present

## 2021-07-14 DIAGNOSIS — E7849 Other hyperlipidemia: Secondary | ICD-10-CM | POA: Diagnosis not present

## 2021-08-01 ENCOUNTER — Encounter: Payer: Self-pay | Admitting: Family Medicine

## 2021-08-01 ENCOUNTER — Telehealth (HOSPITAL_COMMUNITY): Payer: Self-pay | Admitting: *Deleted

## 2021-08-01 NOTE — Telephone Encounter (Signed)
Close encounter 

## 2021-08-02 ENCOUNTER — Ambulatory Visit (HOSPITAL_COMMUNITY)
Admission: RE | Admit: 2021-08-02 | Discharge: 2021-08-02 | Disposition: A | Discharge status: Home / Self Care | Payer: Medicare PPO | Source: Ambulatory Visit | Attending: Cardiovascular Disease | Admitting: Cardiovascular Disease

## 2021-08-02 DIAGNOSIS — I480 Paroxysmal atrial fibrillation: Secondary | ICD-10-CM | POA: Diagnosis not present

## 2021-08-02 MED ORDER — REGADENOSON 0.4 MG/5ML IV SOLN
0.4000 mg | Freq: Once | INTRAVENOUS | Status: AC
  Administered 2021-08-02: 0.4 mg via INTRAVENOUS

## 2021-08-02 MED ORDER — TECHNETIUM TC 99M TETROFOSMIN IV KIT
28.0000 | PACK | Freq: Once | INTRAVENOUS | Status: AC | PRN
  Administered 2021-08-02: 28 via INTRAVENOUS
  Filled 2021-08-02: qty 28

## 2021-08-03 ENCOUNTER — Ambulatory Visit (HOSPITAL_COMMUNITY)
Admission: RE | Admit: 2021-08-03 | Discharge: 2021-08-03 | Disposition: A | Discharge status: Home / Self Care | Payer: Medicare PPO | Source: Ambulatory Visit | Attending: Cardiology | Admitting: Cardiology

## 2021-08-03 LAB — MYOCARDIAL PERFUSION IMAGING
Base ST Depression (mm): 0 mm
LV dias vol: 141 mL (ref 46–106)
LV sys vol: 61 mL
Nuc Stress EF: 57 %
Peak HR: 101 {beats}/min
Rest HR: 65 {beats}/min
Rest Nuclear Isotope Dose: 31.8 mCi
SDS: 0
SRS: 3
SSS: 3
ST Depression (mm): 0 mm
Stress Nuclear Isotope Dose: 28 mCi
TID: 0.96

## 2021-08-03 MED ORDER — TECHNETIUM TC 99M TETROFOSMIN IV KIT
31.8000 | PACK | Freq: Once | INTRAVENOUS | Status: AC | PRN
  Administered 2021-08-03: 31.8 via INTRAVENOUS

## 2021-08-10 ENCOUNTER — Telehealth: Payer: Self-pay

## 2021-08-10 NOTE — Telephone Encounter (Addendum)
Called patient regarding results. Patient had understanding of results.----- Message from Lendon Colonel, NP sent at 08/04/2021  7:10 AM EDT ----- ?Stress test was normal with normal HR response on Flecainide. No evidence of ischemia. Good report.  ?

## 2021-08-17 ENCOUNTER — Other Ambulatory Visit: Payer: Self-pay | Admitting: Family Medicine

## 2021-08-17 ENCOUNTER — Other Ambulatory Visit (INDEPENDENT_AMBULATORY_CARE_PROVIDER_SITE_OTHER): Payer: Self-pay | Admitting: Family Medicine

## 2021-08-17 ENCOUNTER — Other Ambulatory Visit: Payer: Self-pay | Admitting: Internal Medicine

## 2021-08-17 DIAGNOSIS — E034 Atrophy of thyroid (acquired): Secondary | ICD-10-CM

## 2021-08-17 DIAGNOSIS — E559 Vitamin D deficiency, unspecified: Secondary | ICD-10-CM

## 2021-08-18 ENCOUNTER — Encounter: Payer: Self-pay | Admitting: Family Medicine

## 2021-08-21 ENCOUNTER — Ambulatory Visit: Payer: Medicare PPO | Admitting: Internal Medicine

## 2021-08-21 ENCOUNTER — Telehealth: Payer: Self-pay | Admitting: Family Medicine

## 2021-08-21 ENCOUNTER — Encounter: Payer: Self-pay | Admitting: Internal Medicine

## 2021-08-21 ENCOUNTER — Telehealth: Payer: Self-pay | Admitting: Pharmacist

## 2021-08-21 VITALS — BP 132/87 | HR 86 | Ht 61.0 in | Wt 264.4 lb

## 2021-08-21 DIAGNOSIS — I480 Paroxysmal atrial fibrillation: Secondary | ICD-10-CM

## 2021-08-21 DIAGNOSIS — Z5181 Encounter for therapeutic drug level monitoring: Secondary | ICD-10-CM | POA: Diagnosis not present

## 2021-08-21 DIAGNOSIS — Z79899 Other long term (current) drug therapy: Secondary | ICD-10-CM | POA: Diagnosis not present

## 2021-08-21 DIAGNOSIS — I10 Essential (primary) hypertension: Secondary | ICD-10-CM | POA: Diagnosis not present

## 2021-08-21 MED ORDER — WEGOVY 1 MG/0.5ML ~~LOC~~ SOAJ
1.0000 mg | SUBCUTANEOUS | 0 refills | Status: DC
Start: 2021-08-21 — End: 2021-10-10

## 2021-08-21 MED ORDER — WEGOVY 1.7 MG/0.75ML ~~LOC~~ SOAJ: 1.7000 mg | SUBCUTANEOUS | 3 refills | Status: DC

## 2021-08-21 NOTE — Telephone Encounter (Signed)
Patient returning call about Wegovy. Please call back ?

## 2021-08-21 NOTE — Telephone Encounter (Signed)
Pt aware of recommendations from julie ?

## 2021-08-21 NOTE — Telephone Encounter (Signed)
Please let patient know wegovy '1mg'$  weekly was [facility]  ?PA submitted---PA states "available without authorization" ? ?Patient can try to fill at pharmacy ?Wegovy 1.'7mg'$  RX called in for after the '1mg'$  dose pack ? ?She can communicate with PCP for future refills and will need to schedule 3-6 month f/u with PCP and document 5% body weight loss ?

## 2021-08-21 NOTE — Telephone Encounter (Signed)
Pt aware of recommendations

## 2021-08-21 NOTE — Telephone Encounter (Signed)
Pt aware.

## 2021-08-21 NOTE — Telephone Encounter (Signed)
LM to [facility]  

## 2021-08-21 NOTE — Progress Notes (Signed)
? ? ?OFFICE NOTE ? ?Chief Complaint:  ?Occasional palpitations ? ?Primary Care Physician: ?Ronnie Doss M, DO ? ?HPI:  ?Rebekah [name] is a pleasant 66 year old female with a past medical history significant for her super morbid obesity, chronic hypoxia, hypertension, GERD and prior knee replacement. She was previously followed by Dr. Rollene Fare and was being evaluated for bariatric surgery. She is here to establish new care with me today and for preoperative evaluation. 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 She appears he seen Dr. Rollene Fare will order an echocardiogram on August of 2014 which showed moderate thickening of the left ventricle with EF of 60-65%, mildly increased RV systolic pressure and no significant valvular disease. There was left atrial enlargement. She denies any chest pain but does have expected shortness of breath with exertion. Lower extremity venous Dopplers were performed which show no evidence of significant venous insufficiency.   ? ?Rebekah [name] returns today for follow-up. She seems to be doing remarkably well. She underwent gastric sleeve surgery and has had close to 60 pound weight loss. Unfortunately she was complicated by postoperative atrial fibrillation. She was seen by Dr. Martinique and placed on Cardizem. Subsequently she converted to sinus rhythm within 24 hours spontaneously. She's had no recurrence since that time and has not been additionally anticoagulated. She was not continued on Cardizem. She does have an elevated CHADSVASC score of 2. ? ?I saw Rebekah [name] back in the office today. Please see her recent notes when I saw her in the emergency room at [facility]. She was noted to be in atrial fibrillation again and given flecainide to help convert her, but this was not successful. She is placed on diltiazem and in addition to  metoprolol. Rate slowed down and eventually she cardioverted on her own. We also started her on Xarelto which she's continues to take. Overall she's had very good rate control his heart rate is remained in the mid 40s to mid 60s. Blood pressure is somewhat labile and ranges between the low 90s up to the 140s. Overall she feels fairly well. She still has a few episodes of palpitations during the week when she feels that she is in A. fib although she feels like she is in A. fib today and her rhythm is sinus. She may not be sensitive to A. fib anymore. 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 ? ?I saw Rebekah [name] back in the office today. She's had remarkable weight loss. In fact her weight after sleeve gastrectomy is down 288 pounds. Her main issue now is excess tissue for which she's tried to get plastic surgery but has been denied multiple times. She reports no further palpitations or atrial fibrillation. She continues to take Xarelto without any bleeding complications. She continues to use nocturnal oxygen due to hypoxia, although her sleep study indicated no sleep apnea, her overnight oximetry study did indicate hypoxia with a low O2 sats ration of 86%. This was, of course prior to the recent 80-100 pound weight loss. She also reports recently she's been having epistaxis. In fact she had an episode that lasted for 10-12 hours, she used a box of 40 micro-tampons to try to get it to stop and then eventually tried to go to the emergency room. She is using nasal saline but feels that the oxygen at night is probably drying  her nose out. ? ?Rebekah [name] was seen today for an urgent add-on. This morning she was awakened by her alarm clock around 5 AM and noticed that her heart was racing when she woke up. It did not slow down right away and she felt increasingly more fatigue. She said after taking a shower she felt dizzy and had some chest pressure. Her heart rate was very elevated and she tried to take  it noting it was "greater than 200". She decided then take 300 mg of flecainide. Subsequently she took 2 diltiazem's and after several hours he decided to come in for her appointment with the orthopedist. She says that on the way in to the office her heart rate slowed down and she felt better. When she saw her orthopedist they felt that she should be reevaluated by [name] here. Currently she is asymptomatic. EKG was performed showing sinus rhythm with sinus arrhythmia at 81. ? ?Rebekah [name] returns today for follow-up. She had been reporting some worsening leg swelling however this seems to be improved somewhat today. She has had no recurrent paroxysmal arrhythmias. She continues to want to use the pocket pill strategy. ? ?11/21/2016 ? ?Rebekah [name] returns a for follow-up. Is been over year since I last saw her. She was last seen in the A. fib clinic for follow-up and is reported very infrequent use of flecainide. She says that she's used it a few times over the past year and has been successful in converting her to sinus rhythm. Of most notable is that she's had significant weight gain. Weight in March 2017 was 188 pounds and now up to 271 pounds. She was 204 pounds in January. She says this is typical to many factors including immobility after foot surgery, social stressors, brother who had a stroke and poor eating habits. She has a significant understanding of nutrition and her dietary needs but has had difficulty balancing that. She reports that she is consuming only 600-700 cal a day and has lost 20 pounds but has recently plateaued. ? ?03/15/2017 ? ?Rebekah [name] returns today for follow-up.  I last saw her about 3 months ago.  We were working on her blood pressure which has been elevated.  Mostly it appears that her blood pressure is elevated in the morning.  She does not have sleep apnea however has had infrequent nocturnal oxygen desaturations and uses oxygen at night.  This may be contributing to her elevated blood  pressure in the morning.  Recently she has been taking extra losartan 50 mg at night but then takes 25 mg in the morning.  I reviewed her blood home blood pressures and they appear to be well controlled between 494-496 systolic and the 75F and 16B diastolic.  She reports infrequent atrial fibrillation.  The episodes however now are longer lasting and may last throughout the day, despite taking her flecainide.  They eventually go away however it is not clear if it works as quickly.  We considered and discussed monitoring and I presented to her an option of using the alive core application. ? ?01/03/2018 ? ?Rebekah [name] is seen today in follow-up.  She reported recently having had some occasional palpitations.  She thought she experienced an allergic reaction and took up 200 mg of Benadryl over 4 hours.  Absolutely she had some A. fib and then she took her flecainide within 24 hours.  I strongly advised against this at this point as there is significant interaction between Benadryl and flecainide which could potentiate  the levels of flecainide and potentially cause QT prolongation.  He had considered using her EpiPen, and I supported this if she ever feels that she may be having anaphylaxis.  Overall, she denies any chest pain or worsening shortness of breath.  She is been working with the weight management center and unfortunately has not lost a lot of weight but recently started on the original Atkins diet and has had some benefit there.  Blood pressures were running slightly higher and she increased her medicine dose to 50 mg twice daily of the losartan.  I reviewed her home blood pressure readings and the appear to be well controlled.  She is tolerating Xarelto without any difficulty.  And as I mentioned she is only had a couple of episodes of A. fib for which she has needed the pocket pill flecainide. ? ?12/23/2018 ? ?Rebekah [name] is seen today in follow-up.  She has had a couple episodes of persistent atrial  fibrillation despite taking flecainide and diltiazem.  Overall though she says her episodes are short and typically occur less than 1 time a month.  Her blood pressure is elevated today however she feels there is

## 2021-08-21 NOTE — Patient Instructions (Signed)
Medication Instructions:  ?Your physician recommends that you continue on your current medications as directed. Please refer to the Current Medication list given to you today. ? ?*If you need a refill on your cardiac medications before your next appointment, please call your pharmacy* ? ?Follow-Up: ?At [facility], you and your health needs are our priority.  As part of our continuing mission to provide you with exceptional heart care, we have created designated Provider Care Teams.  These Care Teams include your primary Cardiologist (physician) and Advanced Practice Providers (APPs -  Physician Assistants and Nurse Practitioners) who all work together to provide you with the care you need, when you need it. ? ?We recommend signing up for the patient portal called "MyChart".  Sign up information is provided on this After Visit Summary.  MyChart is used to connect with patients for Virtual Visits (Telemedicine).  Patients are able to view lab/test results, encounter notes, upcoming appointments, etc.  Non-urgent messages can be sent to your provider as well.   ?To learn more about what you can do with MyChart, go to [email or web address].   ? ?Your next appointment:   ?12 month(s) ? ?The format for your next appointment:   ?In Person ? ?Provider:   ?Lyman Bishop MD ?

## 2021-08-23 ENCOUNTER — Encounter: Payer: Self-pay | Admitting: Family Medicine

## 2021-08-24 ENCOUNTER — Encounter: Payer: Self-pay | Admitting: Family

## 2021-08-24 ENCOUNTER — Ambulatory Visit: Payer: Medicare PPO | Admitting: Family

## 2021-08-24 DIAGNOSIS — B3731 Acute candidiasis of vulva and vagina: Secondary | ICD-10-CM

## 2021-08-24 MED ORDER — FLUCONAZOLE 150 MG PO TABS: 150.0000 mg | ORAL_TABLET | ORAL | 0 refills | Status: DC | PRN

## 2021-08-24 NOTE — Progress Notes (Signed)
? ?Virtual Visit  Note ?Due to COVID-19 pandemic this visit was conducted virtually. This visit type was conducted due to national recommendations for restrictions regarding the COVID-19 Pandemic (e.g. social distancing, sheltering in place) in an effort to limit this patient's exposure and mitigate transmission in our community. All issues noted in this document were discussed and addressed.  A physical exam was not performed with this format. ? ?I connected with Rebekah [name] on 08/24/21 at 10:10 AM  by telephone and verified that I am speaking with the correct person using two identifiers. Rebekah [name] is currently located at home  and no one is currently with her during visit. The provider, Evelina Dun, FNP is located in their office at time of visit. ? ?I discussed the limitations, risks, security and privacy concerns of performing an evaluation and management service by telephone and the availability of in person appointments. I also discussed with the patient that there may be a patient responsible charge related to this service. The patient expressed understanding and agreed to proceed. ? ? ?Ms. Stanwood,you are scheduled for a virtual visit with your provider today.   ? ?Just as we do with appointments in the office, we must obtain your consent to participate.  Your consent will be active for this visit and any virtual visit you may have with one of our providers in the next 365 days.   ? ?If you have a MyChart account, I can also send a copy of this consent to you electronically.  All virtual visits are billed to your insurance company just like a traditional visit in the office.  As this is a virtual visit, video technology does not allow for your provider to perform a traditional examination.  This may limit your provider's ability to fully assess your condition.  If your provider identifies any concerns that need to be evaluated in person or the need to arrange testing such as labs, EKG, etc, we will  make arrangements to do so.   ? ?Although advances in technology are sophisticated, we cannot ensure that it will always work on either your end or our end.  If the connection with a video visit is poor, we may have to switch to a telephone visit.  With either a video or telephone visit, we are not always able to ensure that we have a secure connection.   I need to obtain your verbal consent now.   Are you willing to proceed with your visit today?  ? ?Rebekah [name] has provided verbal consent on 08/24/2021 for a virtual visit (video or telephone). ? ? ?Evelina Dun, FNP ?08/24/2021  10:11 AM ? ? ?History and Present Illness: ? ?PT calls the office today with UTI symptoms. She is followed by her Urologists and was diagnosed with yeast infection March 1 and treated. States her symptoms improved, but over the last 5 days she has had increased itching. Denies dysuria, hematuria, frequency, fevers, and urgency symptoms.  ?Vaginal Itching ?The patient's primary symptoms include genital itching. The patient's pertinent negatives include no genital odor, vaginal bleeding or vaginal discharge. This is a new problem. The current episode started in the past 7 days. The pain is mild.  ? ? ? ?Review of Systems  ?Genitourinary:  Negative for vaginal discharge.  ? ? ?Observations/Objective: ?No SOB or distress noted  ? ?Assessment and Plan: ?1. Vagina, candidiasis ?Start diflucan  ?Force fluids ?Probiotic  ?Follow up if symptoms worsen or do not improve  ?-  fluconazole (DIFLUCAN) 150 MG tablet; Take 1 tablet (150 mg total) by mouth every three (3) days as needed.  Dispense: 2 tablet; Refill: 0 ? ? ?  ?I discussed the assessment and treatment plan with the patient. The patient was provided an opportunity to ask questions and all were answered. The patient agreed with the plan and demonstrated an understanding of the instructions. ?  ?The patient was advised to call back or seek an in-person evaluation if the symptoms worsen or if the  condition fails to improve as anticipated. ? ?The above assessment and management plan was discussed with the patient. The patient verbalized understanding of and has agreed to the management plan. Patient is aware to call the clinic if symptoms persist or worsen. Patient is aware when to return to the clinic for a follow-up visit. Patient educated on when it is appropriate to go to the emergency department.  ? ?Time call ended:  10:21 AM  ? ?I provided 11 minutes of  non face-to-face time during this encounter. ? ? ? ?Evelina Dun, FNP ? ? ?

## 2021-09-12 ENCOUNTER — Other Ambulatory Visit: Payer: Self-pay | Admitting: Family Medicine

## 2021-09-12 DIAGNOSIS — M1990 Unspecified osteoarthritis, unspecified site: Secondary | ICD-10-CM

## 2021-09-27 DIAGNOSIS — H35372 Puckering of macula, left eye: Secondary | ICD-10-CM | POA: Diagnosis not present

## 2021-09-27 DIAGNOSIS — Z961 Presence of intraocular lens: Secondary | ICD-10-CM | POA: Diagnosis not present

## 2021-09-27 DIAGNOSIS — H18513 Endothelial corneal dystrophy, bilateral: Secondary | ICD-10-CM | POA: Diagnosis not present

## 2021-09-27 DIAGNOSIS — H43813 Vitreous degeneration, bilateral: Secondary | ICD-10-CM | POA: Diagnosis not present

## 2021-10-06 ENCOUNTER — Encounter: Payer: Self-pay | Admitting: Family Medicine

## 2021-10-10 ENCOUNTER — Telehealth: Payer: Self-pay | Admitting: Pharmacist

## 2021-10-10 MED ORDER — WEGOVY 2.4 MG/0.75ML ~~LOC~~ SOAJ: 2.4000 mg | SUBCUTANEOUS | 3 refills | Status: DC

## 2021-10-16 DIAGNOSIS — R310 Gross hematuria: Secondary | ICD-10-CM | POA: Diagnosis not present

## 2021-10-16 DIAGNOSIS — N200 Calculus of kidney: Secondary | ICD-10-CM | POA: Diagnosis not present

## 2021-10-22 ENCOUNTER — Encounter: Payer: Self-pay | Admitting: Internal Medicine

## 2021-10-23 MED ORDER — DILTIAZEM HCL 30 MG PO TABS: ORAL_TABLET | ORAL | 3 refills | Status: DC

## 2021-11-02 ENCOUNTER — Ambulatory Visit: Payer: Medicare PPO | Admitting: Family Medicine

## 2021-11-02 ENCOUNTER — Encounter: Payer: Self-pay | Admitting: Family Medicine

## 2021-11-02 VITALS — BP 112/76 | HR 89 | Temp 98.4°F | Ht 61.0 in | Wt 259.6 lb

## 2021-11-02 DIAGNOSIS — W57XXXA Bitten or stung by nonvenomous insect and other nonvenomous arthropods, initial encounter: Secondary | ICD-10-CM | POA: Diagnosis not present

## 2021-11-02 DIAGNOSIS — S40262A Insect bite (nonvenomous) of left shoulder, initial encounter: Secondary | ICD-10-CM

## 2021-11-02 DIAGNOSIS — S20462A Insect bite (nonvenomous) of left back wall of thorax, initial encounter: Secondary | ICD-10-CM | POA: Diagnosis not present

## 2021-11-02 DIAGNOSIS — L282 Other prurigo: Secondary | ICD-10-CM

## 2021-11-02 MED ORDER — PREDNISONE 10 MG (21) PO TBPK: ORAL_TABLET | ORAL | 0 refills | Status: DC

## 2021-11-02 NOTE — Progress Notes (Signed)
Subjective:  Patient ID: Rebekah [name], female    DOB: [birth year], 66 y.o.   MRN: [id number]  Patient Care Team: Janora Norlander, DO as PCP - General (Family Medicine) Starlyn Skeans, MD as Consulting Physician (Family Medicine) Debara Pickett Nadean Corwin, MD as Consulting Physician (Cardiology) Paula Compton, MD as Consulting Physician (Obstetrics and Gynecology) Lavera Guise, [facility] as Pharmacist (Family Medicine)   Chief Complaint:  Insect Bite   HPI: Rebekah [name] is a 66 y.o. female presenting on 11/02/2021 for Insect Bite   Pt presents today with complaints of ongoing pruritic rash after removal of tick. She states the initial bite was over 5 days ago. The tick has been removed. She reports ongoing pruritis post tick removal. Has used cortisone cream with some relief of symptoms. Denies systemic symptoms.      Relevant past medical, surgical, family, and social history reviewed and updated as indicated.  Allergies and medications reviewed and updated. Data reviewed: Chart in Epic.   Past Medical History:  Diagnosis Date   Allergy    Multiple   Anxiety    Arthritis    Bilateral bunions    Cataract    Both eyes- lenses replaced   Chronic kidney disease 2022   Not kidney disease but kidney stone   Complication of anesthesia    Depression    More anxiety-worry-stress   Difficulty sleeping    prior sleep study did not reveal sleep apnea per patient   DJD (degenerative joint disease)    Dyspnea    Dysrhythmia    a-fib   Environmental allergies    Food allergy    Gallbladder problem    GERD (gastroesophageal reflux disease)    Heart murmur    History of kidney stones    Hypertension    no meds after weight loss   Hypothyroidism    Incontinence of urine    at nite    Joint pain    Left atrial dilatation    Left foot pain    Leg edema    Obesity    s/p gastric sleeve 12/2013 (previously weighed close to 400 lbs)   Osteoarthritis    Oxygen deficiency     3L/min for sleep due to slowed reaperations during sleep   Palpitations    Paroxysmal atrial fibrillation (HCC)    Pneumonia    hx of    PONV (postoperative nausea and vomiting)    Status post bilateral knee replacements    Supplemental oxygen dependent    uses 2l/[place] at night, states HR goes low and O2 drops   Tuberculosis    had 6 month of INH due to exposure    Vaginal vault prolapse     Past Surgical History:  Procedure Laterality Date   APPENDECTOMY     CHOLECYSTECTOMY     CYSTOSCOPY/URETEROSCOPY/HOLMIUM LASER/STENT PLACEMENT Left 09/29/2020   Procedure: CYSTOSCOPY WITH LEFT RETROGRADE PYELOGRAM AND LEFT URETEROSCOPY/HOLMIUM LASER STONE REMOVAL LEFT /STENT PLACEMENT;  Surgeon: Ardis Hughs, MD;  Location: [facility];  Service: Urology;  Laterality: Left;   EYE SURGERY     03/2014 lens implant left lens    FOOT ARTHRODESIS Left 03/15/2016   Procedure: TALONAVICULAR AND SUBTALAR ARTHRODESIS;  Surgeon: Wylene Simmer, MD;  Location: [place];  Service: Orthopedics;  Laterality: Left;   GASTROC RECESSION EXTREMITY Left 03/15/2016   Procedure: LEFT GASTROC RECESSION;  Surgeon: Wylene Simmer, MD;  Location: [place];  Service: Orthopedics;  Laterality: Left;   JOINT REPLACEMENT  04/16/1997   L TOTAL KNEE   LAPAROSCOPIC GASTRIC SLEEVE RESECTION N/A 01/04/2014   Procedure: LAPAROSCOPIC GASTRIC SLEEVE RESECTION;  Surgeon: Greer Pickerel, MD;  Location: [facility];  Service: General;  Laterality: N/A;   TONSILLECTOMY     TOTAL KNEE ARTHROPLASTY Right 05/17/2015   Procedure: RIGHT TOTAL KNEE ARTHROPLASTY;  Surgeon: Paralee Cancel, MD;  Location: [facility];  Service: Orthopedics;  Laterality: Right;   TRANSTHORACIC ECHOCARDIOGRAM  11/14/2012   EF 60-65%, mod LVH & mod conc hypertrophy, grade 1 diastolic dysfunction; LA mildly dilated; RV systolic pressure increased; PA peak pressure 25mHg   TUBAL LIGATION     UPPER GI ENDOSCOPY  01/04/2014   Procedure: UPPER GI  ENDOSCOPY;  Surgeon: EGreer Pickerel MD;  Location: [facility];  Service: General;;    Social History   Socioeconomic History   Marital status: Married    Spouse name: CGianni Fuchs  Number of children: 2   Years of education: Not on file   Highest education level: Bachelor's degree (e.g., BA, AB, BS)  Occupational History   Occupation: special ePublic relations account executive OTHER    Comment: RDiplomatic Services operational officer [employer]   Occupation: retired nMarine scientist- L&D @ [employer]  Tobacco Use   Smoking status: Never   Smokeless tobacco: Never   Tobacco comments:    Tried 1-2 cigarettes as a child. Nothing else.  Vaping Use   Vaping Use: Never used  Substance and Sexual Activity   Alcohol use: No   Drug use: No   Sexual activity: Not Currently    Birth control/protection: Abstinence    Comment: Age, vaginal prolapse  Other Topics Concern   Not on file  Social History Narrative   Lives with SSkwentnawith spouse   Previously a nMarine scientistand subsequently a school teacher   Social Determinants of Health   Financial Resource Strain: Low Risk  (02/17/2021)   Overall Financial Resource Strain (CARDIA)    Difficulty of Paying Living Expenses: Not very hard  Food Insecurity: No Food Insecurity (02/17/2021)   Hunger Vital Sign    Worried About Running Out of Food in the Last Year: Never true    Ran Out of Food in the Last Year: Never true  Transportation Needs: No Transportation Needs (02/17/2021)   PRAPARE - THydrologist(Medical): No    Lack of Transportation (Non-Medical): No  Physical Activity: Inactive (02/17/2021)   Exercise Vital Sign    Days of Exercise per Week: 0 days    Minutes of Exercise per Session: 0 min  Stress: Stress Concern Present (02/17/2021)   [facility]   Feeling of Stress : To some extent  Social Connections: Socially Integrated (02/17/2021)   Social Connection and Isolation Panel  [NHANES]    Frequency of Communication with Friends and Family: More than three times a week    Frequency of Social Gatherings with Friends and Family: More than three times a week    Attends Religious Services: More than 4 times per year    Active Member of [employer] Organizations: Yes    Attends CArchivistMeetings: 1 to 4 times per year    Marital Status: Married  IHuman resources officerViolence: Not At Risk (02/17/2021)   Humiliation, Afraid, Rape, and Kick questionnaire    Fear of Current or Ex-Partner: No    Emotionally Abused: No  Physically Abused: No    Sexually Abused: No    Outpatient Encounter Medications as of 11/02/2021  Medication Sig   acetaminophen (TYLENOL) 500 MG tablet Take 500-1,000 mg by mouth every 6 (six) hours as needed for moderate pain or headache.   apixaban (ELIQUIS) 5 MG TABS tablet Take 1 tablet (5 mg total) by mouth 2 (two) times daily. Resume in 48 hours or when no longer having blood in the urine   cetirizine (ZYRTEC) 10 MG tablet Take 10 mg by mouth daily.   Cholecalciferol (VITAMIN D) 50 MCG (2000 UT) CAPS Take 2,000 Units by mouth daily.   diltiazem (CARDIZEM CD) 120 MG 24 hr capsule TAKE 1 CAPSULE BY MOUTH ONCE DAILY   diltiazem (CARDIZEM) 30 MG tablet TAKE ONE TO TWO TABLETS BY MOUTH EVERY 6 HOURS AS NEEDED FOR  FAST  HEART  RATES   EPINEPHRINE 0.3 mg/0.3 mL IJ SOAJ injection inject 0.51ms into the muscle once as needed for anaphylaxis   flecainide (TAMBOCOR) 50 MG tablet Take 1 tablet (50 mg total) by mouth 2 (two) times daily.   furosemide (LASIX) 20 MG tablet TAKE 1 TABLET BY MOUTH ONCE DAILY   levothyroxine (SYNTHROID) 50 MCG tablet TAKE ONE TABLET BY MOUTH DAILY BEFORE BREAKFAST   losartan (COZAAR) 50 MG tablet Take 1 tablet (50 mg total) by mouth 2 (two) times daily.   Multiple Vitamins-Minerals (MULTIVITAMIN ADULT PO) Take 1 capsule by mouth daily.   MYRBETRIQ 50 MG TB24 tablet Take 50 mg by mouth daily.   OXYGEN Inhale 3 L/min into the  lungs at bedtime. Continuously   predniSONE (STERAPRED UNI-PAK 21 TAB) 10 MG (21) TBPK tablet As directed x 6 days   Semaglutide-Weight Management (WEGOVY) 2.4 MG/0.75ML SOAJ Inject 2.4 mg into the skin once a week. Patient requests 3 month supply   sertraline (ZOLOFT) 50 MG tablet Take 1 tablet (50 mg total) by mouth daily.   traMADol (ULTRAM) 50 MG tablet Take 1-2 tablets (50-100 mg total) by mouth every 6 (six) hours as needed for severe pain.   Vibegron 75 MG TABS Take 75 mg by mouth. 1 tablet Daily   No facility-administered encounter medications on file as of 11/02/2021.    Allergies  Allergen Reactions   Beef-Derived Products Anaphylaxis    After tick bite, cannot eat beef, pork or lamb   Lambs Quarters Anaphylaxis    After tick bite cannot eat beef, pork or lamb   Mucinex [Guaifenesin Er] Anaphylaxis   Pork-Derived Products Anaphylaxis    After tick bite cannot eat beef, pork or lamb   Darvon [Propoxyphene] Other (See Comments)    Hallucinations    Ace Inhibitors Swelling and Cough    Pedal Edema   Ppd [Tuberculin Purified Protein Derivative]     Always has positive testing to PPD, do not use   Hydromet [Hydrocodone Bit-Homatrop Mbr] Itching and Other (See Comments)    Severe stomach pain-face itches.    Sulfonamide Derivatives Rash    Review of Systems  Constitutional:  Negative for activity change, appetite change, chills, diaphoresis, fatigue, fever and unexpected weight change.  Respiratory:  Negative for cough and shortness of breath.   Cardiovascular:  Negative for chest pain, palpitations and leg swelling.  Gastrointestinal:  Negative for abdominal pain, constipation, diarrhea, nausea and vomiting.  Genitourinary:  Negative for decreased urine volume and difficulty urinating.  Musculoskeletal:  Negative for arthralgias, back pain, gait problem, joint swelling, myalgias, neck pain and neck stiffness.  Skin:  Positive  for color change and rash. Negative for pallor  and wound.  Neurological:  Negative for dizziness, weakness, light-headedness and headaches.  Psychiatric/Behavioral:  Negative for confusion.   All other systems reviewed and are negative.       Objective:  BP 112/76   Pulse 89   Temp 98.4 F (36.9 C)   Ht '5\' 1"'$  (1.549 m)   Wt 259 lb 9.6 oz (117.8 kg)   SpO2 92%   BMI 49.05 kg/m    Wt Readings from Last 3 Encounters:  11/02/21 259 lb 9.6 oz (117.8 kg)  08/21/21 264 lb 6.4 oz (119.9 kg)  08/02/21 266 lb (120.7 kg)    Physical Exam Vitals and nursing note reviewed.  Constitutional:      General: She is not in acute distress.    Appearance: Normal appearance. She is obese. She is not ill-appearing, toxic-appearing or diaphoretic.  HENT:     Head: Normocephalic and atraumatic.  Eyes:     Pupils: Pupils are equal, round, and reactive to light.  Cardiovascular:     Rate and Rhythm: Normal rate and regular rhythm.     Heart sounds: Murmur heard.     Systolic murmur is present with a grade of 2/6.  Pulmonary:     Effort: Pulmonary effort is normal.     Breath sounds: Normal breath sounds.  Skin:    General: Skin is warm and dry.     Capillary Refill: Capillary refill takes less than 2 seconds.     Findings: Erythema and rash present. Rash is papular.       Neurological:     General: No focal deficit present.     Mental Status: She is alert and oriented to person, place, and time.  Psychiatric:        Mood and Affect: Mood normal.        Behavior: Behavior normal.        Thought Content: Thought content normal.        Judgment: Judgment normal.     Results for orders placed or performed in visit on 07/07/21  MYOCARDIAL PERFUSION IMAGING  Result Value Ref Range   Stress Nuclear Isotope Dose 28.0 mCi   Rest HR 65.0 bpm   Rest BP 151/97 mmHg   Peak HR 101 bpm   Peak BP 156/93 mmHg   Rest Nuclear Isotope Dose 31.8 mCi   SSS 3.0    SRS 3.0    SDS 0.0    TID 0.96    LV sys vol 61.0 mL   LV dias vol 141.0 46 -  106 mL   Nuc Stress EF 57 %   Base ST Depression (mm) 0 mm   ST Depression (mm) 0 mm       Pertinent labs & imaging results that were available during my care of the patient were reviewed by me and considered in my medical decision making.  Assessment & Plan:  [place] was seen today for insect bite.  Diagnoses and all orders for this visit:  Tick bite of left back wall of thorax, initial encounter Pruritic rash Insect bite of left shoulder with local reaction, initial encounter No indications of tickborne illness. Local reaction from bite. Will treat with steroids. Pt aware to report any new, worsening, or persistent symptoms.  -     predniSONE (STERAPRED UNI-PAK 21 TAB) 10 MG (21) TBPK tablet; As directed x 6 days     Continue all other maintenance medications.  Follow up  plan: Return if symptoms worsen or fail to improve.   Continue healthy lifestyle choices, including diet (rich in fruits, vegetables, and lean proteins, and low in salt and simple carbohydrates) and exercise (at least 30 minutes of moderate physical activity daily).  Educational handout given for tick bite   The above assessment and management plan was discussed with the patient. The patient verbalized understanding of and has agreed to the management plan. Patient is aware to call the clinic if they develop any new symptoms or if symptoms persist or worsen. Patient is aware when to return to the clinic for a follow-up visit. Patient educated on when it is appropriate to go to the emergency department.   Monia Pouch, FNP-C [facility] [phone]

## 2021-11-09 ENCOUNTER — Telehealth: Payer: Self-pay | Admitting: *Deleted

## 2021-11-09 NOTE — Patient Outreach (Signed)
  Care Management   Outreach Note  11/09/2021  Name: Rebekah [name] MRN: [id number] DOB: 08/04/1955  Reason for Referral: Care Coordination - Assessment of Needs.   An unsuccessful telephone outreach was attempted today. The patient was referred to the case management team for assistance with care management and care coordination. A HIPAA compliant message was left on voicemail for patient, providing contact information for CSW, encouraging patient to return the call at her earliest convenience.   Follow Up Plan:  [name] will reschedule initial telephone outreach call for patient with CSW.   Nat Christen, BSW, MSW, LCSW  Licensed Education officer, [facility]  Mailing [place] [address] [place], [place] [address] Physical Address-[address] [place], [place] [address] Toll Free Main # [phone] Fax # [phone] Cell # [phone] [email or web address]

## 2021-11-14 ENCOUNTER — Ambulatory Visit: Payer: Medicare PPO | Admitting: Family Medicine

## 2021-11-14 ENCOUNTER — Encounter: Payer: Self-pay | Admitting: Family Medicine

## 2021-11-14 VITALS — BP 135/73 | HR 98 | Temp 97.1°F | Ht 61.0 in | Wt 256.4 lb

## 2021-11-14 DIAGNOSIS — M1990 Unspecified osteoarthritis, unspecified site: Secondary | ICD-10-CM | POA: Diagnosis not present

## 2021-11-14 DIAGNOSIS — W57XXXA Bitten or stung by nonvenomous insect and other nonvenomous arthropods, initial encounter: Secondary | ICD-10-CM

## 2021-11-14 DIAGNOSIS — Z6841 Body Mass Index (BMI) 40.0 and over, adult: Secondary | ICD-10-CM

## 2021-11-14 DIAGNOSIS — S20462A Insect bite (nonvenomous) of left back wall of thorax, initial encounter: Secondary | ICD-10-CM | POA: Diagnosis not present

## 2021-11-14 DIAGNOSIS — E034 Atrophy of thyroid (acquired): Secondary | ICD-10-CM | POA: Diagnosis not present

## 2021-11-14 DIAGNOSIS — I480 Paroxysmal atrial fibrillation: Secondary | ICD-10-CM

## 2021-11-14 MED ORDER — DOXYCYCLINE HYCLATE 100 MG PO TABS: 100.0000 mg | ORAL_TABLET | Freq: Two times a day (BID) | ORAL | 0 refills | Status: AC

## 2021-11-14 MED ORDER — TRIAMCINOLONE ACETONIDE 0.1 % EX CREA: 1.0000 | TOPICAL_CREAM | Freq: Two times a day (BID) | CUTANEOUS | 0 refills | Status: DC | PRN

## 2021-11-14 NOTE — Progress Notes (Signed)
Subjective: CC: Tick bite, morbid obesity PCP: Rebekah Norlander, DO [facility]:VZCH Rebekah [name] is a 66 y.o. female presenting to clinic today for:  1.  Morbid obesity Patient has been doing well on the [facility] and is currently up to 2.4 mg weekly.  She has reduced her weight by almost 20 pounds since her last visit.  She continues to eat roughly around 900 cal but is trying to get up to 1200 cal/day.  This is limited by her history of gastric sleeve.  She is working with Noom coach in efforts to make good healthy decisions.  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  2.  Tick bite Patient was bitten by a tick which apparently was attached to her for about 3-1/2 weeks before being removed.  She never developed any Lyme disease symptoms but had ongoing erythema and swelling in that site despite use of topical corticosteroid.  She was placed on oral corticosteroids and the swelling and itching did seem to get better.  However since she has discontinued it it started again.  3.  Arthritis Patient with chronic arthritic changes, known degenerative changes in the lumbar spine.  She was previously cared for by Dr. Amil Amen and unfortunately could not tolerate methotrexate.  Biologics were offered but she notes that she was quite worried about the risk of cancer associated with them.  She of note is on Wegovy and understands the potential risks with [facility] as well.  She has been using tramadol intermittently but she worries about long-term repercussions of chronic use of that and does not want to become intolerant to pain medications because she has a known renal stone.  She admits that sometimes the pain is so bad that even when she turns over at night it wakes her up.  It limits her ability to exercise.   ROS: Per HPI  Allergies  Allergen Reactions   Beef-Derived Products Anaphylaxis    After tick bite, cannot eat beef, pork or lamb   Lambs Quarters  Anaphylaxis    After tick bite cannot eat beef, pork or lamb   Mucinex [Guaifenesin Er] Anaphylaxis   Pork-Derived Products Anaphylaxis    After tick bite cannot eat beef, pork or lamb   Darvon [Propoxyphene] Other (See Comments)    Hallucinations    Ace Inhibitors Swelling and Cough    Pedal Edema   Ppd [Tuberculin Purified Protein Derivative]     Always has positive testing to PPD, do not use   Hydromet [Hydrocodone Bit-Homatrop Mbr] Itching and Other (See Comments)    Severe stomach pain-face itches.    Sulfonamide Derivatives Rash   Past Medical History:  Diagnosis Date   Allergy    Multiple   Anxiety    Arthritis    Bilateral bunions    Cataract    Both eyes- lenses replaced   Chronic kidney disease 2022   Not kidney disease but kidney stone   Complication of anesthesia    Depression    More anxiety-worry-stress   Difficulty sleeping    prior sleep study did not reveal sleep apnea per patient   DJD (degenerative joint disease)    Dyspnea    Dysrhythmia    a-fib   Environmental allergies    Food allergy    Gallbladder problem    GERD (gastroesophageal reflux disease)    Heart murmur    History of kidney stones    Hypertension  no meds after weight loss   Hypothyroidism    Incontinence of urine    at nite    Joint pain    Left atrial dilatation    Left foot pain    Leg edema    Obesity    s/p gastric sleeve 12/2013 (previously weighed close to 400 lbs)   Osteoarthritis    Oxygen deficiency    3L/min for sleep due to slowed reaperations during sleep   Palpitations    Paroxysmal atrial fibrillation (HCC)    Pneumonia    hx of    PONV (postoperative nausea and vomiting)    Status post bilateral knee replacements    Supplemental oxygen dependent    uses 2l/Evaro at night, states HR goes low and O2 drops   Tuberculosis    had 6 month of INH due to exposure    Vaginal vault prolapse     Current Outpatient Medications:    acetaminophen (TYLENOL) 500 MG  tablet, Take 500-1,000 mg by mouth every 6 (six) hours as needed for moderate pain or headache., Disp: , Rfl:    apixaban (ELIQUIS) 5 MG TABS tablet, Take 1 tablet (5 mg total) by mouth 2 (two) times daily. Resume in 48 hours or when no longer having blood in the urine, Disp: 180 tablet, Rfl: 3   cetirizine (ZYRTEC) 10 MG tablet, Take 10 mg by mouth daily., Disp: , Rfl:    Cholecalciferol (VITAMIN D) 50 MCG (2000 UT) CAPS, Take 2,000 Units by mouth daily., Disp: , Rfl:    diltiazem (CARDIZEM CD) 120 MG 24 hr capsule, TAKE 1 CAPSULE BY MOUTH ONCE DAILY, Disp: 90 capsule, Rfl: 3   diltiazem (CARDIZEM) 30 MG tablet, TAKE ONE TO TWO TABLETS BY MOUTH EVERY 6 HOURS AS NEEDED FOR  FAST  HEART  RATES, Disp: 30 tablet, Rfl: 3   EPINEPHRINE 0.3 mg/0.3 mL IJ SOAJ injection, inject 0.86ms into the muscle once as needed for anaphylaxis, Disp: 2 each, Rfl: 3   flecainide (TAMBOCOR) 50 MG tablet, Take 1 tablet (50 mg total) by mouth 2 (two) times daily., Disp: 180 tablet, Rfl: 3   furosemide (LASIX) 20 MG tablet, TAKE 1 TABLET BY MOUTH ONCE DAILY, Disp: 90 tablet, Rfl: 3   levothyroxine (SYNTHROID) 50 MCG tablet, TAKE ONE TABLET BY MOUTH DAILY BEFORE BREAKFAST, Disp: 90 tablet, Rfl: 2   losartan (COZAAR) 50 MG tablet, Take 1 tablet (50 mg total) by mouth 2 (two) times daily., Disp: 180 tablet, Rfl: 3   Multiple Vitamins-Minerals (MULTIVITAMIN ADULT PO), Take 1 capsule by mouth daily., Disp: , Rfl:    MYRBETRIQ 50 MG TB24 tablet, Take 50 mg by mouth daily., Disp: , Rfl:    OXYGEN, Inhale 3 L/min into the lungs at bedtime. Continuously, Disp: , Rfl:    Semaglutide-Weight Management (WEGOVY) 2.4 MG/0.75ML SOAJ, Inject 2.4 mg into the skin once a week. Patient requests 3 month supply, Disp: 9 mL, Rfl: 3   sertraline (ZOLOFT) 50 MG tablet, Take 1 tablet (50 mg total) by mouth daily., Disp: 90 tablet, Rfl: 3   traMADol (ULTRAM) 50 MG tablet, Take 1-2 tablets (50-100 mg total) by mouth every 6 (six) hours as needed for  severe pain., Disp: 15 tablet, Rfl: 2   Vibegron 75 MG TABS, Take 75 mg by mouth. 1 tablet Daily, Disp: , Rfl:  Social History   Socioeconomic History   Marital status: Married    Spouse name: Rebekah [name]  Number of children: 2  Years of education: Not on file   Highest education level: Bachelor's degree (Rebekah.g., BA, AB, BS)  Occupational History   Occupation: special Public relations account executive: OTHER    Comment: Diplomatic Services operational officer. Schools   Occupation: retired Marine scientist - L&D @ [employer]  Tobacco Use   Smoking status: Never   Smokeless tobacco: Never   Tobacco comments:    Tried 1-2 cigarettes as a child. Nothing else.  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   Social Determinants of Health   Financial Resource Strain: Low Risk  (02/17/2021)   Overall Financial Resource Strain (CARDIA)    Difficulty of Paying Living Expenses: Not very hard  Food Insecurity: No Food Insecurity (02/17/2021)   Hunger Vital Sign    Worried About Running Out of Food in the Last Year: Never true    Ran Out of Food in the Last Year: Never true  Transportation Needs: No Transportation Needs (02/17/2021)   PRAPARE - Hydrologist (Medical): No    Lack of Transportation (Non-Medical): No  Physical Activity: Inactive (02/17/2021)   Exercise Vital Sign    Days of Exercise per Week: 0 days    Minutes of Exercise per Session: 0 min  Stress: Stress Concern Present (02/17/2021)   [place]    Feeling of Stress : To some extent  Social Connections: Socially Integrated (02/17/2021)   Social  Connection and Isolation Panel [NHANES]    Frequency of Communication with Friends and Family: More than three times a week    Frequency of Social Gatherings with Friends and Family: More than three times a week    Attends Religious Services: More than 4 times per year    Active Member of [employer] or Organizations: Yes    Attends Archivist Meetings: 1 to 4 times per year    Marital Status: Married  Human resources officer Violence: Not At Risk (02/17/2021)   Humiliation, Afraid, Rape, and Kick questionnaire    Fear of Current or Ex-Partner: No    Emotionally Abused: No    Physically Abused: No    Sexually Abused: No   Family History  Problem Relation Age of Onset   Diabetes Father    Hyperlipidemia Father    Hypertension Father    Heart disease Father    Sudden death Father    Anxiety disorder Father    Obesity Father    Varicose Veins Father    Uterine cancer Mother    Cervical cancer Mother    Breast cancer Mother    Cancer Mother        cervical and brreast cancer   Hypertension Mother    Hyperlipidemia Mother    Obesity Mother    Arthritis Mother    Diabetes Maternal Grandmother    Obesity Maternal Grandmother    Diabetes Brother    Hypertension Brother    Stroke Brother    Hypertension Brother    Obesity Daughter     Objective: Office vital signs reviewed. BP 135/73   Pulse 98  Temp (!) 97.1 F (36.2 C)   Ht '5\' 1"'$  (1.549 m)   Wt 256 lb 6.4 oz (116.3 kg)   SpO2 94%   BMI 48.45 kg/m   Physical Examination:  General: Awake, alert, morbidly obese, No acute distress HEENT: Sclera white.  No exophthalmos Cardio: regular rate and rhythm, S1S2 heard, no murmurs appreciated Pulm: clear to auscultation bilaterally, no wheezes, rhonchi or rales; normal work of breathing on room air Skin: Large area of erythema and hyperpigmentation.  There is warmth and mild induration along the left upper shoulder posteriorly. MSK: Gait is antalgic patient is ambulating  independently  Assessment/ Plan: 66 y.o. female   Tick bite of left back wall of thorax, initial encounter - Plan: doxycycline (VIBRA-TABS) 100 MG tablet, triamcinolone cream (KENALOG) 0.1 %  Morbid obesity with BMI of 45.0-49.9, adult (HCC)  Hypercalcemia - Plan: PTH, Intact and Calcium  Paroxysmal A-fib (HCC)  Hypothyroidism due to acquired atrophy of thyroid - Plan: T4, Free, TSH  Arthritis - Plan: Ambulatory referral to Rheumatology  We will empirically treat with some doxycycline.  No target lesions or other findings concerning for Lyme disease but I worry about soft tissue infection versus ongoing irritation.  For this reason we will also place her on triamcinolone cream topically twice daily as needed  Doing well on the [facility].  BMI downtrending.  Remains in morbidly obese category with BMI above 48.  Suspect that hypercalcemia is secondary to use of Lasix.  We will check PTH and calcium for completion.  Free T4 and TSH ordered but patient is asymptomatic from a thyroid standpoint  Patient with history of autoimmune arthritis with previous evaluation done by Dr. Amil Amen.  She is considering Biologics as potential treatment but worries about risks associated.  We discussed risks associated with chronic pain treatment with opioids as well today.  Referral placed  No orders of the defined types were placed in this encounter.  No orders of the defined types were placed in this encounter.    Rebekah Norlander, DO [place] [phone]

## 2021-11-15 ENCOUNTER — Encounter: Payer: Self-pay | Admitting: Family Medicine

## 2021-11-15 LAB — PTH, INTACT AND CALCIUM
Calcium: 10.3 mg/dL (ref 8.7–10.3)
PTH: 38 pg/mL (ref 15–65)

## 2021-11-15 LAB — T4, FREE: Free T4: 1.28 ng/dL (ref 0.82–1.77)

## 2021-11-15 LAB — TSH: TSH: 2.09 u[IU]/mL (ref 0.450–4.500)

## 2021-11-20 ENCOUNTER — Ambulatory Visit: Payer: Self-pay | Admitting: *Deleted

## 2021-11-20 ENCOUNTER — Encounter: Payer: Self-pay | Admitting: *Deleted

## 2021-11-20 ENCOUNTER — Other Ambulatory Visit: Payer: Self-pay | Admitting: *Deleted

## 2021-11-20 NOTE — Patient Instructions (Signed)
Visit Information  Thank you for taking time to visit with me today. Please don't hesitate to contact me if I can be of assistance to you.   Please call the care guide team at [phone] if you need to cancel or reschedule your appointment.   If you are experiencing a Mental Health or Behavioral Health Crisis or need someone to talk to, please call the Suicide and Crisis Lifeline: 988 call the [place] National Suicide Prevention Lifeline: 1-800-273-8255 or TTY: 1-800-799-4 TTY (1-800-799-4889) to talk to a trained counselor call 1-800-273-TALK (toll free, 24 hour hotline) go to [facility] [address], [place] ([phone]) call the [place] Crisis Line: 800-939-9988 call 911  Patient verbalizes understanding of instructions and care plan provided today and agrees to view in MyChart. Active MyChart status and patient understanding of how to access instructions and care plan via MyChart confirmed with patient.     No further follow up required.  Florentina Marquart, BSW, MSW, LCSW  Licensed Clinical Social Worker  [facility] [place] [facility]  Mailing Address-[address], [place], [place] [address] Physical Address-[address], [place], [place] [address] Toll Free Main # [phone] Fax # [phone] Cell # [phone] [email or web address]            

## 2021-11-20 NOTE — Patient Outreach (Signed)
  Care Coordination   Initial Visit Note   11/20/2021 Name: TANNISHA KENNINGTON MRN: [id number] DOB: [birth year]  NATORI GUDINO is a 66 y.o. year old female who sees Janora Norlander, DO for primary care. I spoke with  [name] by phone today.  What matters to the patients health and wellness today?  No Intervention Indicated.   Goals Addressed   None     SDOH assessments and interventions completed:  Yes  SDOH Interventions Today    Flowsheet Row Most Recent Value  SDOH Interventions   Food Insecurity Interventions Intervention Not Indicated  Financial Strain Interventions Intervention Not Indicated  Housing Interventions Intervention Not Indicated  Physical Activity Interventions Patient Refused  Stress Interventions Intervention Not Indicated  Social Connections Interventions Intervention Not Indicated  Transportation Interventions Intervention Not Indicated        Care Coordination Interventions Activated:  No   Care Coordination Interventions:  No, not indicated.   Follow up plan: No further intervention required.   Encounter Outcome:  Pt. Visit Completed.   Nat Christen, BSW, MSW, LCSW  Licensed Education officer, environmental Health System  Mailing [place] [address], [place], [place] [address] Physical Address-[address] [place], [place] [address] Toll Free Main # [phone] Fax # [phone] Cell # [phone] [email or web address]

## 2021-11-20 NOTE — Patient Outreach (Signed)
  Care Coordination   11/20/2021  Name: Rebekah [name] MRN: [id number] DOB: [birth year]   Care Coordination Outreach Attempts:  A second unsuccessful outreach was attempted today to offer the patient with information about available care coordination services as a benefit of their health plan.   A HIPAA compliant message was left on voicemail, providing contact information for CSW, encouraging patient to return CSW's call at her earliest convenience.    Follow Up Plan:  Additional outreach attempts will be made to offer the patient care coordination information and services.   Encounter Outcome:  No Answer.   Care Coordination Interventions Activated:  No    Care Coordination Interventions:  No, not indicated.    Nat Christen, BSW, MSW, LCSW  Licensed Education officer, environmental Health System  Mailing [place] [address] [place], [place] [address] Physical Address-[address] [place], [place] [address] Toll Free Main # [phone] Fax # [phone] Cell # [phone] [email or web address]

## 2021-11-22 ENCOUNTER — Encounter (INDEPENDENT_AMBULATORY_CARE_PROVIDER_SITE_OTHER): Payer: Self-pay

## 2021-11-26 ENCOUNTER — Encounter: Payer: Self-pay | Admitting: Internal Medicine

## 2021-11-27 ENCOUNTER — Encounter (INDEPENDENT_AMBULATORY_CARE_PROVIDER_SITE_OTHER): Payer: Medicare PPO | Admitting: Family Medicine

## 2021-11-27 ENCOUNTER — Other Ambulatory Visit: Payer: Self-pay | Admitting: Family Medicine

## 2021-11-27 DIAGNOSIS — I499 Cardiac arrhythmia, unspecified: Secondary | ICD-10-CM | POA: Diagnosis not present

## 2021-11-27 DIAGNOSIS — F419 Anxiety disorder, unspecified: Secondary | ICD-10-CM

## 2021-11-27 MED ORDER — BUSPIRONE HCL 5 MG PO TABS: ORAL_TABLET | ORAL | 0 refills | Status: DC

## 2021-11-27 NOTE — Telephone Encounter (Signed)

## 2021-12-03 ENCOUNTER — Encounter: Payer: Self-pay | Admitting: Family Medicine

## 2021-12-03 ENCOUNTER — Encounter: Payer: Self-pay | Admitting: Internal Medicine

## 2021-12-04 ENCOUNTER — Other Ambulatory Visit: Payer: Self-pay

## 2021-12-04 MED ORDER — APIXABAN 5 MG PO TABS: 5.0000 mg | ORAL_TABLET | Freq: Two times a day (BID) | ORAL | 3 refills | Status: DC

## 2021-12-15 DIAGNOSIS — R21 Rash and other nonspecific skin eruption: Secondary | ICD-10-CM | POA: Diagnosis not present

## 2021-12-15 DIAGNOSIS — M795 Residual foreign body in soft tissue: Secondary | ICD-10-CM | POA: Diagnosis not present

## 2021-12-18 DIAGNOSIS — U071 COVID-19: Secondary | ICD-10-CM | POA: Diagnosis not present

## 2021-12-19 ENCOUNTER — Encounter (HOSPITAL_BASED_OUTPATIENT_CLINIC_OR_DEPARTMENT_OTHER): Payer: Self-pay | Admitting: Internal Medicine

## 2021-12-20 ENCOUNTER — Telehealth: Payer: Self-pay | Admitting: Family Medicine

## 2021-12-20 NOTE — Telephone Encounter (Signed)
Patient calling to let Dr. Lajuana Ripple know that she tested positive for COVID on Monday 9/4. Was seen in urgent care and they prescribed her medicine.

## 2021-12-25 ENCOUNTER — Encounter (HOSPITAL_BASED_OUTPATIENT_CLINIC_OR_DEPARTMENT_OTHER): Payer: Self-pay | Admitting: Internal Medicine

## 2021-12-26 ENCOUNTER — Observation Stay (HOSPITAL_BASED_OUTPATIENT_CLINIC_OR_DEPARTMENT_OTHER)
Admission: EM | Admit: 2021-12-26 | Discharge: 2021-12-27 | Disposition: A | Discharge status: Home / Self Care | Payer: Medicare PPO | Attending: Internal Medicine | Admitting: Internal Medicine

## 2021-12-26 ENCOUNTER — Encounter (HOSPITAL_BASED_OUTPATIENT_CLINIC_OR_DEPARTMENT_OTHER): Payer: Self-pay | Admitting: Emergency Medicine

## 2021-12-26 ENCOUNTER — Telehealth: Payer: Self-pay | Admitting: Internal Medicine

## 2021-12-26 ENCOUNTER — Emergency Department (HOSPITAL_BASED_OUTPATIENT_CLINIC_OR_DEPARTMENT_OTHER): Payer: Medicare PPO

## 2021-12-26 ENCOUNTER — Encounter (HOSPITAL_COMMUNITY): Payer: Self-pay

## 2021-12-26 ENCOUNTER — Other Ambulatory Visit: Payer: Self-pay

## 2021-12-26 DIAGNOSIS — Z7985 Long-term (current) use of injectable non-insulin antidiabetic drugs: Secondary | ICD-10-CM | POA: Diagnosis not present

## 2021-12-26 DIAGNOSIS — E876 Hypokalemia: Secondary | ICD-10-CM

## 2021-12-26 DIAGNOSIS — I480 Paroxysmal atrial fibrillation: Secondary | ICD-10-CM | POA: Diagnosis not present

## 2021-12-26 DIAGNOSIS — N189 Chronic kidney disease, unspecified: Secondary | ICD-10-CM | POA: Diagnosis not present

## 2021-12-26 DIAGNOSIS — Z96653 Presence of artificial knee joint, bilateral: Secondary | ICD-10-CM | POA: Diagnosis not present

## 2021-12-26 DIAGNOSIS — N390 Urinary tract infection, site not specified: Secondary | ICD-10-CM

## 2021-12-26 DIAGNOSIS — I4891 Unspecified atrial fibrillation: Secondary | ICD-10-CM

## 2021-12-26 DIAGNOSIS — N3281 Overactive bladder: Secondary | ICD-10-CM | POA: Insufficient documentation

## 2021-12-26 DIAGNOSIS — I129 Hypertensive chronic kidney disease with stage 1 through stage 4 chronic kidney disease, or unspecified chronic kidney disease: Secondary | ICD-10-CM | POA: Diagnosis not present

## 2021-12-26 DIAGNOSIS — R0602 Shortness of breath: Secondary | ICD-10-CM | POA: Diagnosis not present

## 2021-12-26 DIAGNOSIS — Z7901 Long term (current) use of anticoagulants: Secondary | ICD-10-CM | POA: Diagnosis not present

## 2021-12-26 DIAGNOSIS — U071 COVID-19: Secondary | ICD-10-CM | POA: Diagnosis not present

## 2021-12-26 DIAGNOSIS — B961 Klebsiella pneumoniae [K. pneumoniae] as the cause of diseases classified elsewhere: Secondary | ICD-10-CM | POA: Diagnosis not present

## 2021-12-26 DIAGNOSIS — E039 Hypothyroidism, unspecified: Secondary | ICD-10-CM | POA: Diagnosis not present

## 2021-12-26 DIAGNOSIS — Z8616 Personal history of COVID-19: Secondary | ICD-10-CM | POA: Insufficient documentation

## 2021-12-26 DIAGNOSIS — Z7189 Other specified counseling: Secondary | ICD-10-CM | POA: Diagnosis not present

## 2021-12-26 DIAGNOSIS — I10 Essential (primary) hypertension: Secondary | ICD-10-CM

## 2021-12-26 DIAGNOSIS — R059 Cough, unspecified: Secondary | ICD-10-CM | POA: Diagnosis not present

## 2021-12-26 DIAGNOSIS — R000 Tachycardia, unspecified: Secondary | ICD-10-CM | POA: Diagnosis present

## 2021-12-26 HISTORY — DX: Unspecified atrial fibrillation: I48.91

## 2021-12-26 LAB — CBC WITH DIFFERENTIAL/PLATELET
Abs Immature Granulocytes: 0.24 10*3/uL — ABNORMAL HIGH (ref 0.00–0.07)
Basophils Absolute: 0 10*3/uL (ref 0.0–0.1)
Basophils Relative: 0 %
Eosinophils Absolute: 0.3 10*3/uL (ref 0.0–0.5)
Eosinophils Relative: 2 %
HCT: 45.1 % (ref 36.0–46.0)
Hemoglobin: 14.3 g/dL (ref 12.0–15.0)
Immature Granulocytes: 1 %
Lymphocytes Relative: 20 %
Lymphs Abs: 3.5 10*3/uL (ref 0.7–4.0)
MCH: 27.9 pg (ref 26.0–34.0)
MCHC: 31.7 g/dL (ref 30.0–36.0)
MCV: 88.1 fL (ref 80.0–100.0)
Monocytes Absolute: 1.4 10*3/uL — ABNORMAL HIGH (ref 0.1–1.0)
Monocytes Relative: 8 %
Neutro Abs: 12.2 10*3/uL — ABNORMAL HIGH (ref 1.7–7.7)
Neutrophils Relative %: 69 %
Platelets: 234 10*3/uL (ref 150–400)
RBC: 5.12 MIL/uL — ABNORMAL HIGH (ref 3.87–5.11)
RDW: 14.6 % (ref 11.5–15.5)
WBC: 17.8 10*3/uL — ABNORMAL HIGH (ref 4.0–10.5)
nRBC: 0 % (ref 0.0–0.2)

## 2021-12-26 LAB — COMPREHENSIVE METABOLIC PANEL
ALT: 49 U/L — ABNORMAL HIGH (ref 0–44)
AST: 35 U/L (ref 15–41)
Albumin: 3.3 g/dL — ABNORMAL LOW (ref 3.5–5.0)
Alkaline Phosphatase: 45 U/L (ref 38–126)
Anion gap: 10 (ref 5–15)
BUN: 33 mg/dL — ABNORMAL HIGH (ref 8–23)
CO2: 27 mmol/L (ref 22–32)
Calcium: 9.2 mg/dL (ref 8.9–10.3)
Chloride: 103 mmol/L (ref 98–111)
Creatinine, Ser: 0.99 mg/dL (ref 0.44–1.00)
GFR, Estimated: >60 mL/min (ref >60)
Glucose, Bld: 103 mg/dL — ABNORMAL HIGH (ref 70–99)
Potassium: 3.1 mmol/L — ABNORMAL LOW (ref 3.5–5.1)
Sodium: 140 mmol/L (ref 135–145)
Total Bilirubin: 0.7 mg/dL (ref 0.3–1.2)
Total Protein: 7 g/dL (ref 6.5–8.1)

## 2021-12-26 LAB — I-STAT VENOUS BLOOD GAS, ED
Acid-Base Excess: 6 mmol/L — ABNORMAL HIGH (ref 0.0–2.0)
Bicarbonate: 30.6 mmol/L — ABNORMAL HIGH (ref 20.0–28.0)
Calcium, Ion: 1.18 mmol/L (ref 1.15–1.40)
HCT: 43 % (ref 36.0–46.0)
Hemoglobin: 14.6 g/dL (ref 12.0–15.0)
O2 Saturation: 67 %
Patient temperature: 98.2
Potassium: 3.4 mmol/L — ABNORMAL LOW (ref 3.5–5.1)
Sodium: 140 mmol/L (ref 135–145)
TCO2: 32 mmol/L (ref 22–32)
pCO2, Ven: 41.7 mmHg — ABNORMAL LOW (ref 44–60)
pH, Ven: 7.473 — ABNORMAL HIGH (ref 7.25–7.43)
pO2, Ven: 32 mmHg (ref 32–45)

## 2021-12-26 LAB — URINALYSIS, ROUTINE W REFLEX MICROSCOPIC
Bilirubin Urine: NEGATIVE
Glucose, UA: NEGATIVE mg/dL
Ketones, ur: NEGATIVE mg/dL
Nitrite: POSITIVE — AB
Protein, ur: NEGATIVE mg/dL
Specific Gravity, Urine: 1.02 (ref 1.005–1.030)
pH: 6 (ref 5.0–8.0)

## 2021-12-26 LAB — URINALYSIS, MICROSCOPIC (REFLEX)

## 2021-12-26 LAB — MAGNESIUM: Magnesium: 2 mg/dL (ref 1.7–2.4)

## 2021-12-26 LAB — TSH: TSH: 3.06 u[IU]/mL (ref 0.350–4.500)

## 2021-12-26 LAB — T4, FREE: Free T4: 1.24 ng/dL — ABNORMAL HIGH (ref 0.61–1.12)

## 2021-12-26 LAB — BRAIN NATRIURETIC PEPTIDE: B Natriuretic Peptide: 107.6 pg/mL — ABNORMAL HIGH (ref 0.0–100.0)

## 2021-12-26 MED ORDER — SODIUM CHLORIDE 0.9 % IV SOLN: 1.0000 g | Freq: Once | INTRAVENOUS | Status: DC

## 2021-12-26 MED ORDER — METOPROLOL TARTRATE 25 MG PO TABS
25.0000 mg | ORAL_TABLET | Freq: Two times a day (BID) | ORAL | Status: DC
  Administered 2021-12-26: 25 mg via ORAL
  Filled 2021-12-26: qty 1

## 2021-12-26 MED ORDER — APIXABAN 5 MG PO TABS
5.0000 mg | ORAL_TABLET | Freq: Two times a day (BID) | ORAL | Status: DC
  Administered 2021-12-26 – 2021-12-27 (×2): 5 mg via ORAL
  Filled 2021-12-26 (×2): qty 1

## 2021-12-26 MED ORDER — POTASSIUM CHLORIDE CRYS ER 20 MEQ PO TBCR
40.0000 meq | EXTENDED_RELEASE_TABLET | Freq: Once | ORAL | Status: AC
  Administered 2021-12-26: 40 meq via ORAL
  Filled 2021-12-26: qty 2

## 2021-12-26 MED ORDER — FLECAINIDE ACETATE 50 MG PO TABS
50.0000 mg | ORAL_TABLET | Freq: Two times a day (BID) | ORAL | Status: DC
  Administered 2021-12-26 – 2021-12-27 (×2): 50 mg via ORAL
  Filled 2021-12-26 (×3): qty 1

## 2021-12-26 MED ORDER — FLECAINIDE ACETATE 50 MG PO TABS
50.0000 mg | ORAL_TABLET | Freq: Once | ORAL | Status: AC
  Administered 2021-12-26: 50 mg via ORAL
  Filled 2021-12-26: qty 1

## 2021-12-26 MED ORDER — MIRABEGRON ER 25 MG PO TB24
25.0000 mg | ORAL_TABLET | Freq: Every day | ORAL | Status: DC
  Administered 2021-12-27: 25 mg via ORAL
  Filled 2021-12-26 (×2): qty 1

## 2021-12-26 MED ORDER — DILTIAZEM HCL-DEXTROSE 125-5 MG/125ML-% IV SOLN (PREMIX)
5.0000 mg/h | INTRAVENOUS | Status: DC
  Administered 2021-12-26: 15 mg/h via INTRAVENOUS
  Administered 2021-12-26: 10 mg/h via INTRAVENOUS
  Administered 2021-12-26: 5 mg/h via INTRAVENOUS
  Filled 2021-12-26 (×2): qty 125

## 2021-12-26 MED ORDER — LACTATED RINGERS IV BOLUS: 500.0000 mL | Freq: Once | INTRAVENOUS | Status: DC

## 2021-12-26 MED ORDER — DILTIAZEM HCL 25 MG/5ML IV SOLN
20.0000 mg | Freq: Once | INTRAVENOUS | Status: AC
  Administered 2021-12-26: 20 mg via INTRAVENOUS
  Filled 2021-12-26: qty 5

## 2021-12-26 MED ORDER — LEVOTHYROXINE SODIUM 50 MCG PO TABS: 50.0000 ug | ORAL_TABLET | Freq: Every day | ORAL | Status: DC

## 2021-12-26 MED ORDER — BUSPIRONE HCL 5 MG PO TABS
5.0000 mg | ORAL_TABLET | Freq: Two times a day (BID) | ORAL | Status: DC
  Administered 2021-12-26 – 2021-12-27 (×2): 5 mg via ORAL
  Filled 2021-12-26 (×2): qty 1

## 2021-12-26 MED ORDER — ONDANSETRON HCL 4 MG/2ML IJ SOLN: 4.0000 mg | Freq: Four times a day (QID) | INTRAMUSCULAR | Status: DC | PRN

## 2021-12-26 MED ORDER — ACETAMINOPHEN 325 MG PO TABS: 650.0000 mg | ORAL_TABLET | ORAL | Status: DC | PRN

## 2021-12-26 NOTE — ED Notes (Signed)
[place] MD notified of continued tachycardiac despite patient being maxed out on diltiazem drip. See orders.

## 2021-12-26 NOTE — ED Provider Notes (Signed)
[facility] EMERGENCY DEPARTMENT Provider Note   CSN: [id number] Arrival date & time: 12/26/21  0950     History  Chief Complaint  Patient presents with   Tachycardia    Rebekah [name] is a 66 y.o. female.  With PMH of hypothyroidism, HTN, obesity, HLD, paroxysmal A-fib on Eliquis who presents with A-fib and palpitations after recent COVID infection 12/19/2021.  Patient said her and her husband got sick over the Labor Day holiday weekend and went to an urgent care where they tested positive for COVID.  She had a cough that was nonproductive with congestion, fevers and generally feeling unwell.  No vomiting or diarrhea and is still tolerating p.o.  However after being seen at the urgent care she was sent home with Paxlovid.  She started taking the Paxlovid and stopped her flecainide and halved  her medication of the Cardizem.  However, over this past week and she has had persistent palpitations and heart racing knowing she was in A-fib which she is very sensitive to as well as feeling short of breath which she says is because her heart is racing and it feels uncomfortable.  She denies any fevers or chest pain.  She has not missed doses of her Eliquis or thyroid medications.  HPI     Home Medications Prior to Admission medications   Medication Sig Start Date End Date Taking? Authorizing Provider  acetaminophen (TYLENOL) 500 MG tablet Take 500-1,000 mg by mouth every 6 (six) hours as needed for moderate pain or headache.   Yes [provider]  apixaban (ELIQUIS) 5 MG TABS tablet Take 1 tablet (5 mg total) by mouth 2 (two) times daily. 12/04/21  Yes Hilty, Nadean Corwin, MD  busPIRone (BUSPAR) 5 MG tablet Take 1 tablet (5 mg total) by mouth 2 (two) times daily for 7 days, THEN 1.5 tablets (7.5 mg total) 2 (two) times daily for 7 days, THEN 2 tablets (10 mg total) 2 (two) times daily. 11/27/21 12/11/22 Yes Gottschalk, Leatrice Jewels M, DO  cetirizine (ZYRTEC) 10 MG tablet Take 10 mg by mouth  daily as needed for allergies.   Yes [provider]  Cholecalciferol (VITAMIN D) 50 MCG (2000 UT) CAPS Take 2,000 Units by mouth daily.   Yes [provider]  clotrimazole-betamethasone (LOTRISONE) lotion Apply topically 2 (two) times daily. 12/15/21  Yes [provider]  diltiazem (CARDIZEM CD) 120 MG 24 hr capsule TAKE 1 CAPSULE BY MOUTH ONCE DAILY 08/18/21  Yes Hilty, Nadean Corwin, MD  diltiazem (CARDIZEM) 30 MG tablet TAKE ONE TO TWO TABLETS BY MOUTH EVERY 6 HOURS AS NEEDED FOR  FAST  HEART  RATES 10/23/21  Yes Hilty, Nadean Corwin, MD  EPINEPHRINE 0.3 mg/0.3 mL IJ SOAJ injection inject 0.30ms into the muscle once as needed for anaphylaxis 01/09/21  Yes Gottschalk, Ashly M, DO  flecainide (TAMBOCOR) 50 MG tablet Take 1 tablet (50 mg total) by mouth 2 (two) times daily. 07/07/21  Yes LLendon Colonel NP  furosemide (LASIX) 20 MG tablet TAKE 1 TABLET BY MOUTH ONCE DAILY Patient taking differently: Take 20 mg by mouth daily as needed for fluid or edema. 08/18/21  Yes Hilty, KNadean Corwin MD  levothyroxine (SYNTHROID) 50 MCG tablet TAKE ONE TABLET BY MOUTH DAILY BEFORE BREAKFAST 08/17/21  Yes Gottschalk, Ashly M, DO  losartan (COZAAR) 50 MG tablet Take 1 tablet (50 mg total) by mouth 2 (two) times daily. 05/17/21  Yes Hilty, KNadean Corwin MD  Multiple Vitamins-Minerals (MULTIVITAMIN ADULT PO) Take 1  capsule by mouth daily.   Yes [provider]  OXYGEN Inhale 3 L/min into the lungs at bedtime. Continuously   Yes [provider]  Semaglutide-Weight Management (WEGOVY) 2.4 MG/0.75ML SOAJ Inject 2.4 mg into the skin once a week. Patient requests 3 month supply 10/10/21  Yes Ronnie Doss M, DO  triamcinolone cream (KENALOG) 0.1 % Apply 1 Application topically 2 (two) times daily as needed (rash). 11/14/21  Yes Gottschalk, Ashly M, DO  Vibegron 75 MG TABS Take 75 mg by mouth. 1 tablet Daily   Yes [provider]  promethazine-dextromethorphan (PROMETHAZINE-DM) 6.25-15  MG/5ML syrup Take 5 mLs by mouth every 4 (four) hours as needed for nausea/vomiting. 12/18/21   [provider]      Allergies    Beef-derived products, Lambs quarters, Mucinex [guaifenesin er], Pork-derived products, Darvon [propoxyphene], Ace inhibitors, Ppd [tuberculin purified protein derivative], Hydromet [hydrocodone bit-homatrop mbr], and Sulfonamide derivatives    Review of Systems   Review of Systems  Physical Exam Updated Vital Signs BP (!) 158/92 (BP Location: Left Arm)   Pulse (!) 131   Temp 98.6 F (37 C) (Oral)   Resp 18   Ht '5\' 4"'$  (1.626 m)   Wt 119 kg   SpO2 97%   BMI 45.03 kg/m  Physical Exam Constitutional: Alert and oriented.  Slightly anxious but nontoxic, no acute distress eyes: Conjunctivae are normal. ENT      Head: Normocephalic and atraumatic.      Nose: No congestion.      Mouth/Throat: Mucous membranes are moist.      Neck: No stridor. Cardiovascular: S1, S2,  tachycardic, irregular rhythm, warm and dry Respiratory: mildly tachypneic, normal BS, O2 sat >95 on RA Gastrointestinal: Soft and nontender. Musculoskeletal: Normal range of motion in all extremities. No pitting edema or ttp BL/LE Neurologic: Normal speech and language. No gross focal neurologic deficits are appreciated. Skin: Skin is warm, dry and intact. No rash noted. Psychiatric: slightly anxious  ED Results / Procedures / Treatments   Labs (all labs ordered are listed, but only abnormal results are displayed) Labs Reviewed  COMPREHENSIVE METABOLIC PANEL - Abnormal; Notable for the following components:      Result Value   Potassium 3.1 (*)    Glucose, Bld 103 (*)    BUN 33 (*)    Albumin 3.3 (*)    ALT 49 (*)    All other components within normal limits  BRAIN NATRIURETIC PEPTIDE - Abnormal; Notable for the following components:   B Natriuretic Peptide 107.6 (*)    All other components within normal limits  CBC WITH DIFFERENTIAL/PLATELET - Abnormal; Notable for the  following components:   WBC 17.8 (*)    RBC 5.12 (*)    Neutro Abs 12.2 (*)    Monocytes Absolute 1.4 (*)    Abs Immature Granulocytes 0.24 (*)    All other components within normal limits  URINALYSIS, ROUTINE W REFLEX MICROSCOPIC - Abnormal; Notable for the following components:   Hgb urine dipstick MODERATE (*)    Nitrite POSITIVE (*)    Leukocytes,Ua SMALL (*)    All other components within normal limits  URINALYSIS, MICROSCOPIC (REFLEX) - Abnormal; Notable for the following components:   Bacteria, UA MANY (*)    All other components within normal limits  I-STAT VENOUS BLOOD GAS, ED - Abnormal; Notable for the following components:   pH, Ven 7.473 (*)    pCO2, Ven 41.7 (*)    Bicarbonate 30.6 (*)  Acid-Base Excess 6.0 (*)    Potassium 3.4 (*)    All other components within normal limits  URINE CULTURE  MAGNESIUM  TSH  T4, FREE  BLOOD GAS, VENOUS    EKG EKG Interpretation  Date/Time:  Tuesday December 26 2021 10:04:03 EDT Ventricular Rate:  149 PR Interval:    QRS Duration: 144 QT Interval:  282 QTC Calculation: 444 R Axis:   146 Text Interpretation: Extreme tachycardia with wide complex, no further rhythm analysis attempted Nonspecific T wave changes in the setting of tachycardia, A-fib RVR  , BBB Confirmed by Georgina Snell [phone]) on 12/26/2021 10:19:00 AM  Radiology DG Chest 2 View  Result Date: 12/26/2021 CLINICAL DATA:  Cough, recent COVID positive state EXAM: CHEST - 2 VIEW COMPARISON:  07/20/2014 FINDINGS: Cardiac size is within normal limits. Thoracic aorta is tortuous and ectatic. Lung fields are clear of any infiltrate or pulmonary edema. There is no pleural effusion or pneumothorax. IMPRESSION: No active cardiopulmonary disease. Electronically Signed   By: Elmer Picker M.D.   On: 12/26/2021 10:47    Procedures .Critical Care  Performed by: Elgie Congo, MD Authorized by: Elgie Congo, MD   Critical care provider statement:     Critical care time (minutes):  35   Critical care was necessary to treat or prevent imminent or life-threatening deterioration of the following conditions: A fib RVR requiring cardizem gtt.   Critical care was time spent personally by me on the following activities:  Development of treatment plan with patient or surrogate, discussions with consultants, evaluation of patient's response to treatment, examination of patient, ordering and review of laboratory studies, ordering and review of radiographic studies, ordering and performing treatments and interventions, pulse oximetry, re-evaluation of patient's condition, review of old charts and obtaining history from patient or surrogate   Care discussed with: admitting provider     Remain on constant cardiac monitoring, A-fib with RVR  Medications Ordered in ED Medications  diltiazem (CARDIZEM) 125 mg in dextrose 5% 125 mL (1 mg/mL) infusion (10 mg/hr Intravenous New Bag/Given 12/26/21 1709)  lactated ringers bolus 500 mL (0 mLs Intravenous Hold 12/26/21 1115)  diltiazem (CARDIZEM) injection 20 mg (20 mg Intravenous Given 12/26/21 1021)  potassium chloride SA (KLOR-CON M) CR tablet 40 mEq (40 mEq Oral Given 12/26/21 1145)  flecainide (TAMBOCOR) tablet 50 mg (50 mg Oral Given 12/26/21 1254)    ED Course/ Medical Decision Making/ A&P Clinical Course as of 12/26/21 1738  Tue Dec 26, 2021  1043 Personal interpretation of chest x-ray shows some perihilar infiltrate and raised right-sided hemidiaphragm, no pneumothorax. [VB]  [id number] Agree with radiologist read, no evidence of acute cardiopulmonary pathology. [VB]  6962 Patient received IV load of 20 mg Cardizem with improvement in heart rate but still persistent A-fib RVR up to the 130s, will start on Cardizem infusion. [VB]  9528 She has mild hypokalemia 3.1 which I have ordered repletion for with 40 mEq potassium chloride.  Magnesium is within normal limits.  She has of leukocytosis 17.8 with left shift but  no evidence of pneumonia on chest x-ray.  She has a slight elevated BUN with creatinine 0.99.  Suspect some prerenal influence, no significant signs of fluid overload or peripheral edema, no pleural effusions or pulmonary edema on chest x-ray.  BNP is elevated 107 which I suspect could also be in the setting of stretch from A-fib RVR. [VB]  1504 Patient also received a dose of her home flecainide with improvement in her heart  rates.  Her case has been discussed with hospitalist who has admitted patient for continued Cardizem drip and evaluation by cardiology.  UA suggestive of UTI, ordering for Rocephin. [VB]    Clinical Course User Index [VB] Elgie Congo, MD                           Medical Decision Making  Rebekah [name] is a 66 y.o. female.  With PMH of hypothyroidism, HTN, obesity, HLD, paroxysmal A-fib on Eliquis who presents with A-fib and palpitations after recent COVID infection 12/19/2021.   Patient presented to A-fib with RVR with rates in the 160s and stable high blood pressure 139/102.  Is likely in the setting of medication changes and missing doses of flecainide while recently on Paxlovid for COVID infection.  She was given an IV push of 20 mg Cardizem which helped her rate initially down to the 90s but eventually went back to A-fib RVR 130s 140s.  Labs were obtained to evaluate for any acute electrolyte abnormalities which did show a hypokalemia 3.1 which was repleted with oral repletion.  Normal magnesium.  And slightly elevated BUN which I suspect could be from decreased p.o. intake in the setting of recent infection.  She was given 500 cc IV fluid bolus.  She did have a white count of 17.8 with left shift but no signs of post-COVID bacterial pneumonia, no pulmonary edema, no pneumothorax or pleural effusions.  BNP slightly elevated 107.6 which I suspect is stretch in the setting of A-fib RVR.  VBG obtained with respiratory alkalosis as she is anxious and intermittently tachypneic pH  7.47 PCO2 41.7.  No concern for PE with no signs of DVT on exam, no history of PE or DVT and no hypoxia and additionally is properly anticoagulated on Eliquis.  Despite initial push of Cardizem and potassium repletion with small fluids she remained in A-fib RVR, started on Cardizem drip. Ordered home flecainide. Will page hospitalist for admission.  Amount and/or Complexity of Data Reviewed Labs: ordered. Radiology: ordered.  Risk Prescription drug management. Decision regarding hospitalization.    Final Clinical Impression(s) / ED Diagnoses Final diagnoses:  Atrial fibrillation with RVR ([place])  Hypokalemia  Urinary tract infection without hematuria, site unspecified    Rx / DC Orders ED Discharge Orders     None         Elgie Congo, MD 12/26/21 1738

## 2021-12-26 NOTE — Telephone Encounter (Signed)
Pt c/o Shortness Of Breath: STAT if SOB developed within the last 24 hours or pt is noticeably SOB on the phone  1. Are you currently SOB (can you hear that pt is SOB on the phone)? Yes  2. How long have you been experiencing SOB? Last week  3. Are you SOB when sitting or when up moving around? Both  4. Are you currently experiencing any other symptoms? Afib

## 2021-12-26 NOTE — Telephone Encounter (Signed)
Patient spoke in a very labored, sob, and hoarse voice. She was difficult to understand and had to repeat her words to me. She stated she contracted COVID around 12/19/21. Against her wishes, she was placed on paxlovid and had to come off flecainide. This past Saturday 9/9, she took half the dose of all her medications. On Sunday, she went back on full dose. When she takes pill-in-the-pocket, her heart rate is 120. After she takes her diltiazem and it wears off, her heart rated is 160-180. She does not fell the pounding in her chest like before, but is aware it is fast. She wanted to know if she should increase her pill-in-the-pocked dose. Dr. Gardiner Rhyme (DOD), recommended she go to the ED. I informed patient. She stated she didn't want to sit in an ED waiting area. I suggested [facility] and [facility] EDs that might not be as busy. She said she would go to one of them.

## 2021-12-26 NOTE — Progress Notes (Signed)
Noted that pt HR comes up when out of bed  and do activity. Ask to use bedside commode but pt insisted to walk to the bathroom. Pt is independent and denies symptoms.  Encourage to call if needs assistance. Plan of care ongoing.

## 2021-12-26 NOTE — ED Triage Notes (Signed)
Patient presents to ED via POV from home. Here 2 weeks post COVID. Here with a fib. Denies palpations. Denies chest pain.

## 2021-12-26 NOTE — Consult Note (Signed)
Cardiology Consultation   Patient ID: Rebekah [name] MRN: [id number]; DOB: 1956-01-08  Admit date: 12/26/2021 Date of Consult: 12/26/2021  PCP:  Janora Norlander, [facility] Cardiologist:  Pixie Casino, MD        Patient Profile:   Rebekah [name] is a 66 y.o. female with a hx of paroxysmal atrial fibrillation, morbid obesity, hypertension, GERD, and chronic hypoxia who is being seen 12/26/2021 for the evaluation of atrial fibrillation with RVR in the setting of COVID-19 infection at the request of Dr. Florene Glen.  History of Present Illness:   Rebekah [name] presents with a chief complaint of atrial fibrillation (AFib) after starting Paxlovid for COVID-19. She was diagnosed with COVID-19 around Labor Day.  She went to urgent care and was started on Paxlovid.  After her first dose she went into atrial fibrillation.  She has been using a KardiaMobile to monitor her heart rate, which reached 189 bpm.  She is very attuned to when she goes into atrial fibrillation.  At baseline, she has been well managed and done well on daily diltiazem and flecainide.  She had to reduce her doses of diltiazem and Eliquis while on the Paxlovid.  She stopped taking her flecainide.  While in A-fib she has been taking pill-in-a-pocket Diltiazem every six hours to manage her heart rate, which temporarily decreases to the 120s before increasing again.  Miss Terlecki states that she stopped taking Paxlovid on Saturday and resumed her regular medications on Sunday.  Despite this she has remained in atrial fibrillation with RVR.  She denies experiencing any swelling in her legs or feet and denies any chest pain or pressure. The patient has not taken furosemide for two weeks due to illness and lack of appetite. She has a history of respiratory failure 15 years ago and currently uses 3 liters of oxygen at night due to decreased respirations which she attributes to her medications, Flecainide and Diltiazem. She  has had a sleep study that ruled out sleep apnea.  Given her persistent atrial fibrillation she reluctantly went to [place] where she was indeed found to be in atrial fibrillation with RVR.  She was started on diltiazem with plans for cardioversion, though this changed when she lost her IV access.  She reports significant exertional dyspnea that is new in the setting of COVID-19 and A-fib with RVR.  At baseline she is not had any issues with chest pain or shortness of breath.   Past Medical History:  Diagnosis Date   Allergy    Multiple   Anxiety    Arthritis    Bilateral bunions    Cataract    Both eyes- lenses replaced   Chronic kidney disease 2022   Not kidney disease but kidney stone   Complication of anesthesia    Depression    More anxiety-worry-stress   Difficulty sleeping    prior sleep study did not reveal sleep apnea per patient   DJD (degenerative joint disease)    Dyspnea    Dysrhythmia    a-fib   Environmental allergies    Food allergy    Gallbladder problem    GERD (gastroesophageal reflux disease)    Heart murmur    History of kidney stones    Hypertension    no meds after weight loss   Hypothyroidism    Incontinence of urine    at nite    Joint pain    Left atrial dilatation  Left foot pain    Leg edema    Obesity    s/p gastric sleeve 12/2013 (previously weighed close to 400 lbs)   Osteoarthritis    Oxygen deficiency    3L/min for sleep due to slowed reaperations during sleep   Palpitations    Paroxysmal atrial fibrillation (HCC)    Pneumonia    hx of    PONV (postoperative nausea and vomiting)    Status post bilateral knee replacements    Supplemental oxygen dependent    uses 2l/[place] at night, states HR goes low and O2 drops   Tuberculosis    had 6 month of INH due to exposure    Vaginal vault prolapse     Past Surgical History:  Procedure Laterality Date   APPENDECTOMY     CHOLECYSTECTOMY     CYSTOSCOPY/URETEROSCOPY/HOLMIUM  LASER/STENT PLACEMENT Left 09/29/2020   Procedure: CYSTOSCOPY WITH LEFT RETROGRADE PYELOGRAM AND LEFT URETEROSCOPY/HOLMIUM LASER STONE REMOVAL LEFT /STENT PLACEMENT;  Surgeon: Ardis Hughs, MD;  Location: [facility];  Service: Urology;  Laterality: Left;   EYE SURGERY     03/2014 lens implant left lens    FOOT ARTHRODESIS Left 03/15/2016   Procedure: TALONAVICULAR AND SUBTALAR ARTHRODESIS;  Surgeon: Wylene Simmer, MD;  Location: [facility];  Service: Orthopedics;  Laterality: Left;   GASTROC RECESSION EXTREMITY Left 03/15/2016   Procedure: LEFT GASTROC RECESSION;  Surgeon: Wylene Simmer, MD;  Location: [facility];  Service: Orthopedics;  Laterality: Left;   JOINT REPLACEMENT  04/16/1997   L TOTAL KNEE   LAPAROSCOPIC GASTRIC SLEEVE RESECTION N/A 01/04/2014   Procedure: LAPAROSCOPIC GASTRIC SLEEVE RESECTION;  Surgeon: Greer Pickerel, MD;  Location: [facility];  Service: General;  Laterality: N/A;   TONSILLECTOMY     TOTAL KNEE ARTHROPLASTY Right 05/17/2015   Procedure: RIGHT TOTAL KNEE ARTHROPLASTY;  Surgeon: Paralee Cancel, MD;  Location: [facility];  Service: Orthopedics;  Laterality: Right;   TRANSTHORACIC ECHOCARDIOGRAM  11/14/2012   EF 60-65%, mod LVH & mod conc hypertrophy, grade 1 diastolic dysfunction; LA mildly dilated; RV systolic pressure increased; PA peak pressure 60mHg   TUBAL LIGATION     UPPER GI ENDOSCOPY  01/04/2014   Procedure: UPPER GI ENDOSCOPY;  Surgeon: EGreer Pickerel MD;  Location: [facility];  Service: General;;     Home Medications:  Prior to Admission medications   Medication Sig Start Date End Date Taking? Authorizing Provider  acetaminophen (TYLENOL) 500 MG tablet Take 500-1,000 mg by mouth every 6 (six) hours as needed for moderate pain or headache.   Yes [provider]  apixaban (ELIQUIS) 5 MG TABS tablet Take 1 tablet (5 mg total) by mouth 2 (two) times daily. 12/04/21  Yes Hilty, KNadean Corwin MD  busPIRone (BUSPAR) 5 MG tablet Take 1 tablet  (5 mg total) by mouth 2 (two) times daily for 7 days, THEN 1.5 tablets (7.5 mg total) 2 (two) times daily for 7 days, THEN 2 tablets (10 mg total) 2 (two) times daily. 11/27/21 12/11/22 Yes Gottschalk, ALeatrice JewelsM, DO  cetirizine (ZYRTEC) 10 MG tablet Take 10 mg by mouth daily as needed for allergies.   Yes [provider]  Cholecalciferol (VITAMIN D) 50 MCG (2000 UT) CAPS Take 2,000 Units by mouth daily.   Yes [provider]  clotrimazole-betamethasone (LOTRISONE) lotion Apply topically 2 (two) times daily. 12/15/21  Yes [provider]  diltiazem (CARDIZEM CD) 120 MG 24 hr capsule TAKE 1 CAPSULE BY MOUTH ONCE DAILY 08/18/21  Yes Hilty,  Nadean Corwin, MD  diltiazem (CARDIZEM) 30 MG tablet TAKE ONE TO TWO TABLETS BY MOUTH EVERY 6 HOURS AS NEEDED FOR  FAST  HEART  RATES 10/23/21  Yes Hilty, Nadean Corwin, MD  EPINEPHRINE 0.3 mg/0.3 mL IJ SOAJ injection inject 0.35ms into the muscle once as needed for anaphylaxis 01/09/21  Yes Gottschalk, Ashly M, DO  flecainide (TAMBOCOR) 50 MG tablet Take 1 tablet (50 mg total) by mouth 2 (two) times daily. 07/07/21  Yes LLendon Colonel NP  furosemide (LASIX) 20 MG tablet TAKE 1 TABLET BY MOUTH ONCE DAILY Patient taking differently: Take 20 mg by mouth daily as needed for fluid or edema. 08/18/21  Yes Hilty, KNadean Corwin MD  levothyroxine (SYNTHROID) 50 MCG tablet TAKE ONE TABLET BY MOUTH DAILY BEFORE BREAKFAST 08/17/21  Yes Gottschalk, Ashly M, DO  losartan (COZAAR) 50 MG tablet Take 1 tablet (50 mg total) by mouth 2 (two) times daily. 05/17/21  Yes Hilty, KNadean Corwin MD  Multiple Vitamins-Minerals (MULTIVITAMIN ADULT PO) Take 1 capsule by mouth daily.   Yes [provider]  OXYGEN Inhale 3 L/min into the lungs at bedtime. Continuously   Yes [provider]  Semaglutide-Weight Management (WEGOVY) 2.4 MG/0.75ML SOAJ Inject 2.4 mg into the skin once a week. Patient requests 3 month supply 10/10/21  Yes GRonnie DossM, DO  triamcinolone cream  (KENALOG) 0.1 % Apply 1 Application topically 2 (two) times daily as needed (rash). 11/14/21  Yes Gottschalk, Ashly M, DO  Vibegron 75 MG TABS Take 75 mg by mouth. 1 tablet Daily   Yes [provider]  promethazine-dextromethorphan (PROMETHAZINE-DM) 6.25-15 MG/5ML syrup Take 5 mLs by mouth every 4 (four) hours as needed for nausea/vomiting. 12/18/21   [provider]    Inpatient Medications: Scheduled Meds:  apixaban  5 mg Oral BID   flecainide  50 mg Oral BID   metoprolol tartrate  25 mg Oral BID   Continuous Infusions:  diltiazem (CARDIZEM) infusion 10 mg/hr (12/26/21 1709)   lactated ringers Stopped (12/26/21 1115)   PRN Meds:   Allergies:    Allergies  Allergen Reactions   Beef-Derived Products Anaphylaxis    After tick bite, cannot eat beef, pork or lamb   Lambs Quarters Anaphylaxis    After tick bite cannot eat beef, pork or lamb   Mucinex [Guaifenesin Er] Anaphylaxis   Pork-Derived Products Anaphylaxis    After tick bite cannot eat beef, pork or lamb   Darvon [Propoxyphene] Other (See Comments)    Hallucinations    Ace Inhibitors Swelling and Cough    Pedal Edema   Ppd [Tuberculin Purified Protein Derivative]     Always has positive testing to PPD, do not use   Hydromet [Hydrocodone Bit-Homatrop Mbr] Itching and Other (See Comments)    Severe stomach pain-face itches.    Sulfonamide Derivatives Rash    Social History:   Social History   Socioeconomic History   Marital status: Married    Spouse name: CKeiarra Charon  Number of children: 2   Years of education: 16   Highest education level: Bachelor's degree (e.g., BA, AB, BS)  Occupational History   Occupation: special ePublic relations account executive OTHER    Comment: RDiplomatic Services operational officer [employer]   Occupation: retired nMarine scientist- L&D @ [employer]  Tobacco Use   Smoking status: Never    Passive exposure: Never   Smokeless tobacco: Never   Tobacco comments:    Tried 1-2 cigarettes as a child.  Nothing  else.  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   Social Determinants of Health   Financial Resource Strain: Low Risk  (11/20/2021)   Overall Financial Resource Strain (CARDIA)    Difficulty of Paying Living Expenses: Not hard at all  Food Insecurity: No Food Insecurity (11/20/2021)   Hunger Vital Sign    Worried About Running Out of Food in the Last Year: Never true    [place] in the Last Year: Never true  Transportation Needs: No Transportation Needs (11/20/2021)   PRAPARE - Hydrologist (Medical): No    Lack of Transportation (Non-Medical): No  Physical Activity: Inactive (11/20/2021)   Exercise Vital Sign    Days of Exercise per Week: 0 days    Minutes of Exercise per Session: 0 min  Stress: No Stress Concern Present (11/20/2021)   [place]    Feeling of Stress : Only a little  Social Connections: Socially Integrated (11/20/2021)   Social Connection and Isolation Panel [NHANES]    Frequency of Communication with Friends and Family: More than three times a week    Frequency of Social Gatherings with Friends and Family: More than three times a week    Attends Religious Services: More than 4 times per year    Active Member of [employer] or Organizations: Yes    Attends Music therapist: More than 4 times per year    Marital Status: Married  Human resources officer Violence: Not At Risk (11/20/2021)   Humiliation, Afraid, Rape, and Kick questionnaire    Fear of Current or Ex-Partner: No    Emotionally Abused: No    Physically Abused: No     Sexually Abused: No    Family History:    Family History  Problem Relation Age of Onset   Diabetes Father    Hyperlipidemia Father    Hypertension Father    Heart disease Father    Sudden death Father    Anxiety disorder Father    Obesity Father    Varicose Veins Father    Uterine cancer Mother    Cervical cancer Mother    Breast cancer Mother    Cancer Mother        cervical and brreast cancer   Hypertension Mother    Hyperlipidemia Mother    Obesity Mother    Arthritis Mother    Diabetes Maternal Grandmother    Obesity Maternal Grandmother    Diabetes Brother    Hypertension Brother    Stroke Brother    Hypertension Brother    Obesity Daughter      ROS:  Please see the history of present illness.  All other ROS reviewed and negative.     Physical Exam/Data:   Vitals:   12/26/21 1407 12/26/21 1418 12/26/21 1430 12/26/21 1640  BP:   (!) 119/93 (!) 158/92  Pulse: 73 96 83 (!) 131  Resp: '18 15 15 18  '$ Temp:    98.6 F (37 C)  TempSrc:    Oral  SpO2: 100%  100% 95% 97%  Weight:    119 kg  Height:    '5\' 4"'$  (1.626 m)   No intake or output data in the 24 hours ending 12/26/21 1811    12/26/2021    4:40 PM 11/14/2021    8:10 AM 11/02/2021    2:57 PM  Last 3 Weights  Weight (lbs) 262 lb 5.6 oz 256 lb 6.4 oz 259 lb 9.6 oz  Weight (kg) 119 kg 116.302 kg 117.754 kg     VS:  BP (!) 158/92 (BP Location: Left Arm)   Pulse (!) 131   Temp 98.6 F (37 C) (Oral)   Resp 18   Ht '5\' 4"'$  (1.626 m)   Wt 119 kg   SpO2 97%   BMI 45.03 kg/m  , BMI Body mass index is 45.03 kg/m. GENERAL: No acute distress HEENT: Pupils equal round and reactive, fundi not visualized, oral mucosa unremarkable NECK:  No jugular venous distention, waveform within normal limits, carotid upstroke brisk and symmetric, no bruits, no thyromegaly LUNGS: Rhonchi at right base HEART: Tachycardic.  Irregular irregular.  PMI not displaced or sustained,S1 and S2 within normal limits, no S3, no S4, no  clicks, no rubs, no murmurs ABD: BS.  Positive bowel sounds normal in frequency in pitch, no bruits, no rebound, no guarding, no midline pulsatile mass, no hepatomegaly, no splenomegaly EXT:  2 plus pulses throughout, no edema, no cyanosis no clubbing SKIN:  No rashes no nodules NEURO:  Cranial nerves II through XII grossly intact, motor grossly intact throughout PSYCH:  Cognitively intact, oriented to person place and time   EKG:  The EKG was personally reviewed and demonstrates: Atrial fibrillation.  Rate 149 bpm.  Right bundle branch block. Telemetry:  Telemetry was personally reviewed and demonstrates: Atrial fibrillation with RVR.  PVCs  Relevant CV Studies:  Lexiscan Myoview 07/2021:   The study is normal. The study is low risk.   No ST deviation was noted.   LV perfusion is normal. There is no evidence of ischemia. There is no evidence of infarction.   Left ventricular function is normal. Nuclear stress EF: 57 %. The left ventricular ejection fraction is normal (55-65%). End diastolic cavity size is mildly enlarged.   Prior study available for comparison from 04/06/2015. No changes compared to prior study.   Echo 10/21/2018:  1. The left ventricle has normal systolic function with an ejection  fraction of 60-65%. The cavity size was normal. There is mildly increased  left ventricular wall thickness. Left ventricular diastolic Doppler  parameters are consistent with impaired  relaxation. No evidence of left ventricular regional wall motion  abnormalities.   2. No evidence of mitral valve stenosis. Trivial mitral regurgitation.   3. The aortic valve is tricuspid. Mild calcification of the aortic valve.  Aortic valve regurgitation is trivial by color flow Doppler. No stenosis  of the aortic valve.   4. The aortic root is normal in size and structure.   5. Left atrial size was mildly dilated.   6. The right ventricle has normal systolic function. The cavity was  normal. There is no  increase in right ventricular wall thickness.   7. Right atrial size was mildly dilated.   8. Normal IVC size, PA systolic pressure 27 mmHg.    Laboratory Data:  High Sensitivity Troponin:  No results for input(s): "TROPONINIHS" in the last 720 hours.   Chemistry Recent Labs  Lab 12/26/21 1012 12/26/21 1114  NA 140 140  K 3.1*  3.4*  CL 103  --   CO2 27  --   GLUCOSE 103*  --   BUN 33*  --   CREATININE 0.99  --   CALCIUM 9.2  --   MG 2.0  --   GFRNONAA >60  --   ANIONGAP 10  --     Recent Labs  Lab 12/26/21 1012  PROT 7.0  ALBUMIN 3.3*  AST 35  ALT 49*  ALKPHOS 45  BILITOT 0.7   Lipids No results for input(s): "CHOL", "TRIG", "HDL", "LABVLDL", "LDLCALC", "CHOLHDL" in the last 168 hours.  Hematology Recent Labs  Lab 12/26/21 1012 12/26/21 1114  WBC 17.8*  --   RBC 5.12*  --   HGB 14.3 14.6  HCT 45.1 43.0  MCV 88.1  --   MCH 27.9  --   MCHC 31.7  --   RDW 14.6  --   PLT 234  --    Thyroid  Recent Labs  Lab 12/26/21 1012  TSH 3.060  FREET4 1.24*    BNP Recent Labs  Lab 12/26/21 1012  BNP 107.6*    DDimer No results for input(s): "DDIMER" in the last 168 hours.   Radiology/Studies:  DG Chest 2 View  Result Date: 12/26/2021 CLINICAL DATA:  Cough, recent COVID positive state EXAM: CHEST - 2 VIEW COMPARISON:  07/20/2014 FINDINGS: Cardiac size is within normal limits. Thoracic aorta is tortuous and ectatic. Lung fields are clear of any infiltrate or pulmonary edema. There is no pleural effusion or pneumothorax. IMPRESSION: No active cardiopulmonary disease. Electronically Signed   By: Elmer Picker M.D.   On: 12/26/2021 10:47     Assessment and Plan:   1. Atrial fibrillation: - Patient reports recent COVID-19 infection and initiation of Paxlovid, which she suspects contributed to the recurrence of AFib. - Currently on Flecainide, Diltiazem, and Eliquis (half dose due to Paxlovid interaction).  She received an extra dose of flecainide at the  med center in [facility]. - Plan: Continue flecainide.  Continue diltiazem drip and add metoprolol to help control heart rate.  Given that her Eliquis was reduced while on Paxlovid and her recent COVID-19 infection, recommend TEE/cardioversion tomorrow though she has not missed any doses of her Eliquis.  Shared Decision Making/Informed Consent The risks [stroke, cardiac arrhythmias rarely resulting in the need for a temporary or permanent pacemaker, skin irritation or burns, esophageal damage, perforation (1:10,000 risk), bleeding, pharyngeal hematoma as well as other potential complications associated with conscious sedation including aspiration, arrhythmia, respiratory failure and death], benefits (treatment guidance, restoration of normal sinus rhythm, diagnostic support) and alternatives of a transesophageal echocardiogram guided cardioversion were discussed in detail with Ms. Faulks and she is willing to proceed.  2. COVID-19 infection: - Patient recently tested positive for COVID-19 on 9/4 and was started on Paxlovid, cough medicine, and Dextromethorphan.  No additional treatment needed at this time.  3. Hypertension:  Hold home losartan to allow BP room for rate control.  Hold lasix.  Risk Assessment/Risk Scores:          CHA2DS2-VASc Score = 4   This indicates a 4.8% annual risk of stroke. The patient's score is based upon: CHF History: 0 HTN History: 1 Diabetes History: 1 Stroke History: 0 Vascular Disease History: 0 Age Score: 1 Gender Score: 1      For questions or updates, please contact [place] Please consult [email or web address] for contact info under    Signed, Skeet Latch, MD  12/26/2021 6:11 PM

## 2021-12-26 NOTE — H&P (Signed)
History and Physical    ANTONIETTE PEAKE ZOX:[id number] DOB: 06-13-1955 DOA: 12/26/2021  PCP: Janora Norlander, DO  Patient coming from: home  I have personally briefly reviewed patient's old medical records in [place]  Chief Complaint: palpitations/afib.  HPI: Rebekah [name] is Sameria Morss 66 y.o. female with medical history significant of atrial fibrillation on eliquis, HTN, gerd, anxiety/depression and multiple other medical issues here with palpitations.  Her symptoms started shortly after her recent COVID 19 infection.  She notes that she tested positive for COVID 19 on 9/4.  The week prior she noted some sinus issues, but was otherwise ok.  Saturday, 9/2, she and her husband woke up with fever, chills, cough, body aches.  She described the symptoms as severe flu symptoms.  She went to urgent care on 9/4 and tested positive for covid 19.  She was presribed paxlovid and attributes her symptoms of worsening afib to this medicine.  She called Dr. Debara Pickett and he instructed her to hold her flecainide and half her other cardiac meds (including eliquis and diltiazem).  She presented to the ED today with worsening SOB.  Denies current covid symptoms other than persistent decreased taste and appetite.  No smoking or drinking.  ED Course: treated with cardizem, fluids, potassium repletion.  Started on dilt gtt.  Home flecainide ordered.  Review of Systems: As per HPI otherwise all other systems reviewed and are negative.  Past Medical History:  Diagnosis Date   Allergy    Multiple   Anxiety    Arthritis    Bilateral bunions    Cataract    Both eyes- lenses replaced   Chronic kidney disease 2022   Not kidney disease but kidney stone   Complication of anesthesia    Depression    More anxiety-worry-stress   Difficulty sleeping    prior sleep study did not reveal sleep apnea per patient   DJD (degenerative joint disease)    Dyspnea    Dysrhythmia    Evertt Chouinard-fib   Environmental allergies    Food  allergy    Gallbladder problem    GERD (gastroesophageal reflux disease)    Heart murmur    History of kidney stones    Hypertension    no meds after weight loss   Hypothyroidism    Incontinence of urine    at nite    Joint pain    Left atrial dilatation    Left foot pain    Leg edema    Obesity    s/p gastric sleeve 12/2013 (previously weighed close to 400 lbs)   Osteoarthritis    Oxygen deficiency    3L/min for sleep due to slowed reaperations during sleep   Palpitations    Paroxysmal atrial fibrillation (HCC)    Pneumonia    hx of    PONV (postoperative nausea and vomiting)    Status post bilateral knee replacements    Supplemental oxygen dependent    uses 2l/[place] at night, states HR goes low and O2 drops   Tuberculosis    had 6 month of INH due to exposure    Vaginal vault prolapse     Past Surgical History:  Procedure Laterality Date   APPENDECTOMY     CHOLECYSTECTOMY     CYSTOSCOPY/URETEROSCOPY/HOLMIUM LASER/STENT PLACEMENT Left 09/29/2020   Procedure: CYSTOSCOPY WITH LEFT RETROGRADE PYELOGRAM AND LEFT URETEROSCOPY/HOLMIUM LASER STONE REMOVAL LEFT /STENT PLACEMENT;  Surgeon: Ardis Hughs, MD;  Location: [facility];  Service: Urology;  Laterality: Left;  EYE SURGERY     03/2014 lens implant left lens    FOOT ARTHRODESIS Left 03/15/2016   Procedure: TALONAVICULAR AND SUBTALAR ARTHRODESIS;  Surgeon: Wylene Simmer, MD;  Location: [place];  Service: Orthopedics;  Laterality: Left;   GASTROC RECESSION EXTREMITY Left 03/15/2016   Procedure: LEFT GASTROC RECESSION;  Surgeon: Wylene Simmer, MD;  Location: [facility];  Service: Orthopedics;  Laterality: Left;   JOINT REPLACEMENT  04/16/1997   L TOTAL KNEE   LAPAROSCOPIC GASTRIC SLEEVE RESECTION N/Ghalia Reicks 01/04/2014   Procedure: LAPAROSCOPIC GASTRIC SLEEVE RESECTION;  Surgeon: Greer Pickerel, MD;  Location: [facility];  Service: General;  Laterality: N/Azelyn Batie;   TONSILLECTOMY     TOTAL KNEE ARTHROPLASTY Right  05/17/2015   Procedure: RIGHT TOTAL KNEE ARTHROPLASTY;  Surgeon: Paralee Cancel, MD;  Location: [facility];  Service: Orthopedics;  Laterality: Right;   TRANSTHORACIC ECHOCARDIOGRAM  11/14/2012   EF 60-65%, mod LVH & mod conc hypertrophy, grade 1 diastolic dysfunction; LA mildly dilated; RV systolic pressure increased; PA peak pressure 45mHg   TUBAL LIGATION     UPPER GI ENDOSCOPY  01/04/2014   Procedure: UPPER GI ENDOSCOPY;  Surgeon: EGreer Pickerel MD;  Location: [facility];  Service: General;;    Social History  reports that she has never smoked. She has never been exposed to tobacco smoke. She has never used smokeless tobacco. She reports that she does not drink alcohol and does not use drugs.  Allergies  Allergen Reactions   Beef-Derived Products Anaphylaxis    After tick bite, cannot eat beef, pork or lamb   Lambs Quarters Anaphylaxis    After tick bite cannot eat beef, pork or lamb   Mucinex [Guaifenesin Er] Anaphylaxis   Pork-Derived Products Anaphylaxis    After tick bite cannot eat beef, pork or lamb   Darvon [Propoxyphene] Other (See Comments)    Hallucinations    Ace Inhibitors Swelling and Cough    Pedal Edema   Ppd [Tuberculin Purified Protein Derivative]     Always has positive testing to PPD, do not use   Hydromet [Hydrocodone Bit-Homatrop Mbr] Itching and Other (See Comments)    Severe stomach pain-face itches.    Sulfonamide Derivatives Rash    Family History  Problem Relation Age of Onset   Diabetes Father    Hyperlipidemia Father    Hypertension Father    Heart disease Father    Sudden death Father    Anxiety disorder Father    Obesity Father    Varicose Veins Father    Uterine cancer Mother    Cervical cancer Mother    Breast cancer Mother    Cancer Mother        cervical and brreast cancer   Hypertension Mother    Hyperlipidemia Mother    Obesity Mother    Arthritis Mother    Diabetes Maternal Grandmother    Obesity Maternal Grandmother    Diabetes  Brother    Hypertension Brother    Stroke Brother    Hypertension Brother    Obesity Daughter    Prior to Admission medications   Medication Sig Start Date End Date Taking? Authorizing Provider  acetaminophen (TYLENOL) 500 MG tablet Take 500-1,000 mg by mouth every 6 (six) hours as needed for moderate pain or headache.   Yes [provider]  apixaban (ELIQUIS) 5 MG TABS tablet Take 1 tablet (5 mg total) by mouth 2 (two) times daily. 12/04/21  Yes Hilty, KNadean Corwin MD  busPIRone (BUSPAR) 5  MG tablet Take 1 tablet (5 mg total) by mouth 2 (two) times daily for 7 days, THEN 1.5 tablets (7.5 mg total) 2 (two) times daily for 7 days, THEN 2 tablets (10 mg total) 2 (two) times daily. 11/27/21 12/11/22 Yes Gottschalk, Leatrice Jewels M, DO  cetirizine (ZYRTEC) 10 MG tablet Take 10 mg by mouth daily as needed for allergies.   Yes [provider]  Cholecalciferol (VITAMIN D) 50 MCG (2000 UT) CAPS Take 2,000 Units by mouth daily.   Yes [provider]  clotrimazole-betamethasone (LOTRISONE) lotion Apply topically 2 (two) times daily. 12/15/21  Yes [provider]  diltiazem (CARDIZEM CD) 120 MG 24 hr capsule TAKE 1 CAPSULE BY MOUTH ONCE DAILY 08/18/21  Yes Hilty, Nadean Corwin, MD  diltiazem (CARDIZEM) 30 MG tablet TAKE ONE TO TWO TABLETS BY MOUTH EVERY 6 HOURS AS NEEDED FOR  FAST  HEART  RATES 10/23/21  Yes Hilty, Nadean Corwin, MD  EPINEPHRINE 0.3 mg/0.3 mL IJ SOAJ injection inject 0.61ms into the muscle once as needed for anaphylaxis 01/09/21  Yes Gottschalk, Ashly M, DO  flecainide (TAMBOCOR) 50 MG tablet Take 1 tablet (50 mg total) by mouth 2 (two) times daily. 07/07/21  Yes LLendon Colonel NP  furosemide (LASIX) 20 MG tablet TAKE 1 TABLET BY MOUTH ONCE DAILY Patient taking differently: Take 20 mg by mouth daily as needed for fluid or edema. 08/18/21  Yes Hilty, KNadean Corwin MD  levothyroxine (SYNTHROID) 50 MCG tablet TAKE ONE TABLET BY MOUTH DAILY BEFORE BREAKFAST 08/17/21  Yes Gottschalk,  Ashly M, DO  losartan (COZAAR) 50 MG tablet Take 1 tablet (50 mg total) by mouth 2 (two) times daily. 05/17/21  Yes Hilty, KNadean Corwin MD  Multiple Vitamins-Minerals (MULTIVITAMIN ADULT PO) Take 1 capsule by mouth daily.   Yes [provider]  OXYGEN Inhale 3 L/min into the lungs at bedtime. Continuously   Yes [provider]  Semaglutide-Weight Management (WEGOVY) 2.4 MG/0.75ML SOAJ Inject 2.4 mg into the skin once Alexi Geibel week. Patient requests 3 month supply 10/10/21  Yes GRonnie DossM, DO  triamcinolone cream (KENALOG) 0.1 % Apply 1 Application topically 2 (two) times daily as needed (rash). 11/14/21  Yes Gottschalk, Ashly M, DO  Vibegron 75 MG TABS Take 75 mg by mouth. 1 tablet Daily   Yes [provider]  promethazine-dextromethorphan (PROMETHAZINE-DM) 6.25-15 MG/5ML syrup Take 5 mLs by mouth every 4 (four) hours as needed for nausea/vomiting. 12/18/21   [provider]    Physical Exam: Vitals:   12/26/21 1407 12/26/21 1418 12/26/21 1430 12/26/21 1640  BP:   (!) 119/93 (!) 158/92  Pulse: 73 96 83 (!) 131  Resp: '18 15 15 18  '$ Temp:    98.6 F (37 C)  TempSrc:    Oral  SpO2: 100% 100% 95% 97%  Weight:    119 kg  Height:    '5\' 4"'$  (1.626 m)    Constitutional: NAD, calm, comfortable Vitals:   12/26/21 1407 12/26/21 1418 12/26/21 1430 12/26/21 1640  BP:   (!) 119/93 (!) 158/92  Pulse: 73 96 83 (!) 131  Resp: '18 15 15 18  '$ Temp:    98.6 F (37 C)  TempSrc:    Oral  SpO2: 100% 100% 95% 97%  Weight:    119 kg  Height:    '5\' 4"'$  (1.626 m)   Eyes: PERRL, lids and conjunctivae normal ENMT: Mucous membranes are moist.  Neck: normal, supple Respiratory: clear to auscultation bilaterally Cardiovascular: irregularly irregular, tachy  Abdomen: no tenderness, no masses palpated. Musculoskeletal: no clubbing / cyanosis. No joint deformity upper and lower extremities. Good ROM, no contractures. Normal muscle tone.  Skin: no rashes, lesions, ulcers. No  induration Neurologic: CN 2-12 grossly intact.moving all extremities.  Psychiatric: Normal judgment and insight. Alert and oriented x 3. Normal mood.   Labs on Admission: I have personally reviewed following labs and imaging studies  CBC: Recent Labs  Lab 12/26/21 1012 12/26/21 1114  WBC 17.8*  --   NEUTROABS 12.2*  --   HGB 14.3 14.6  HCT 45.1 43.0  MCV 88.1  --   PLT 234  --     Basic Metabolic Panel: Recent Labs  Lab 12/26/21 1012 12/26/21 1114  NA 140 140  K 3.1* 3.4*  CL 103  --   CO2 27  --   GLUCOSE 103*  --   BUN 33*  --   CREATININE 0.99  --   CALCIUM 9.2  --   MG 2.0  --     GFR: Estimated Creatinine Clearance: 70 mL/min (by C-G formula based on SCr of 0.99 mg/dL).  Liver Function Tests: Recent Labs  Lab 12/26/21 1012  AST 35  ALT 49*  ALKPHOS 45  BILITOT 0.7  PROT 7.0  ALBUMIN 3.3*    Urine analysis:    Component Value Date/Time   COLORURINE YELLOW 12/26/2021 1233   APPEARANCEUR CLEAR 12/26/2021 1233   APPEARANCEUR Cloudy (Alyssandra Hulsebus) 05/10/2020 1055   LABSPEC 1.020 12/26/2021 1233   PHURINE 6.0 12/26/2021 1233   GLUCOSEU NEGATIVE 12/26/2021 1233   HGBUR MODERATE (Riad Wagley) 12/26/2021 1233   BILIRUBINUR NEGATIVE 12/26/2021 1233   BILIRUBINUR Negative 05/10/2020 1055   KETONESUR NEGATIVE 12/26/2021 1233   PROTEINUR NEGATIVE 12/26/2021 1233   NITRITE POSITIVE (Tamarion Haymond) 12/26/2021 1233   LEUKOCYTESUR SMALL (Belinda Bringhurst) 12/26/2021 1233    Radiological Exams on Admission: DG Chest 2 View  Result Date: 12/26/2021 CLINICAL DATA:  Cough, recent COVID positive state EXAM: CHEST - 2 VIEW COMPARISON:  07/20/2014 FINDINGS: Cardiac size is within normal limits. Thoracic aorta is tortuous and ectatic. Lung fields are clear of any infiltrate or pulmonary edema. There is no pleural effusion or pneumothorax. IMPRESSION: No active cardiopulmonary disease. Electronically Signed   By: Elmer Picker M.D.   On: 12/26/2021 10:47    EKG: Independently reviewed. Atrial fibrillation,  rBBB  Assessment/Plan Principal Problem:   Atrial fibrillation with rapid ventricular response (HCC) Active Problems:   Morbid obesity ([place])   Hypokalemia   Educated about COVID-19 virus infection   Atrial fibrillation ([place])    Assessment and Plan: Atrial fibrillation with RVR Appreciate cardiology assistance Continue dilt gtt, flecainide.  Metoprolol added. Planning for TEE cardioversion 9/13 am TSH wnl, free T4 mildly elevated, follow outpatient  COVID 19 infection Tested positive 9/4.  No clear infectious sx at this time, but given recent infection and now hospitalized, will continue isolation for now (10 days).   Hypertension Holding losartan and lasix at this time  Hypothyroidism Synthroid  Overactive bladder Vibegron  Anxiety Buspar   Obesity BMI 45 On wegovy at home     DVT prophylaxis: eliquis  Code Status:   full  Family Communication:  none  Disposition Plan:   Patient is from:  home  Anticipated DC to:  home  Anticipated DC date:  Pending TEE/cardioversion  Anticipated DC barriers: Pending cardioversion  Consults called:  cardiology  Admission status:  inpatient   Severity of Illness: The appropriate patient status for this patient is  INPATIENT. Inpatient status is judged to be reasonable and necessary in order to provide the required intensity of service to ensure the patient's safety. The patient's presenting symptoms, physical exam findings, and initial radiographic and laboratory data in the context of their chronic comorbidities is felt to place them at high risk for further clinical deterioration. Furthermore, it is not anticipated that the patient will be medically stable for discharge from the hospital within 2 midnights of admission.   * I certify that at the point of admission it is my clinical judgment that the patient will require inpatient hospital care spanning beyond 2 midnights from the point of admission due to high intensity of  service, high risk for further deterioration and high frequency of surveillance required.Fayrene Helper MD [facility]  How to contact the [facility] Attending or Consulting provider [place] or covering provider during after hours [place], for this patient?   Check the care team in [facility] and look for Leasia Swann) attending/consulting TR[facility] provider listed and b) the [facility] team listed Log into [email or web address] and use [place]'s universal password to access. If you do not have the password, please contact the hospital operator. Locate the [facility] provider you are looking for under [facility] and page to Devetta Hagenow number that you can be directly reached. If you still have difficulty reaching the provider, please page the [facility] (Director on Call) for the Hospitalists listed on amion for assistance.  12/26/2021, 7:40 PM

## 2021-12-27 ENCOUNTER — Encounter (HOSPITAL_COMMUNITY)
Admission: EM | Disposition: A | Discharge status: Home / Self Care | Payer: Self-pay | Source: Home / Self Care | Attending: Emergency Medicine

## 2021-12-27 ENCOUNTER — Encounter (HOSPITAL_COMMUNITY): Payer: Self-pay | Admitting: Anesthesiology

## 2021-12-27 DIAGNOSIS — I4891 Unspecified atrial fibrillation: Secondary | ICD-10-CM | POA: Diagnosis not present

## 2021-12-27 DIAGNOSIS — I129 Hypertensive chronic kidney disease with stage 1 through stage 4 chronic kidney disease, or unspecified chronic kidney disease: Secondary | ICD-10-CM | POA: Diagnosis not present

## 2021-12-27 DIAGNOSIS — B961 Klebsiella pneumoniae [K. pneumoniae] as the cause of diseases classified elsewhere: Secondary | ICD-10-CM | POA: Diagnosis not present

## 2021-12-27 DIAGNOSIS — Z7189 Other specified counseling: Secondary | ICD-10-CM | POA: Diagnosis not present

## 2021-12-27 DIAGNOSIS — U071 COVID-19: Secondary | ICD-10-CM | POA: Diagnosis not present

## 2021-12-27 DIAGNOSIS — I10 Essential (primary) hypertension: Secondary | ICD-10-CM | POA: Diagnosis not present

## 2021-12-27 DIAGNOSIS — Z7985 Long-term (current) use of injectable non-insulin antidiabetic drugs: Secondary | ICD-10-CM | POA: Diagnosis not present

## 2021-12-27 DIAGNOSIS — E876 Hypokalemia: Secondary | ICD-10-CM | POA: Diagnosis not present

## 2021-12-27 DIAGNOSIS — N390 Urinary tract infection, site not specified: Secondary | ICD-10-CM | POA: Diagnosis not present

## 2021-12-27 DIAGNOSIS — E039 Hypothyroidism, unspecified: Secondary | ICD-10-CM | POA: Diagnosis not present

## 2021-12-27 DIAGNOSIS — I480 Paroxysmal atrial fibrillation: Secondary | ICD-10-CM | POA: Diagnosis not present

## 2021-12-27 DIAGNOSIS — Z8616 Personal history of COVID-19: Secondary | ICD-10-CM | POA: Diagnosis not present

## 2021-12-27 DIAGNOSIS — Z7901 Long term (current) use of anticoagulants: Secondary | ICD-10-CM | POA: Diagnosis not present

## 2021-12-27 HISTORY — DX: Unspecified atrial fibrillation: I48.91

## 2021-12-27 LAB — BASIC METABOLIC PANEL
Anion gap: 7 (ref 5–15)
BUN: 24 mg/dL — ABNORMAL HIGH (ref 8–23)
CO2: 28 mmol/L (ref 22–32)
Calcium: 9.2 mg/dL (ref 8.9–10.3)
Chloride: 107 mmol/L (ref 98–111)
Creatinine, Ser: 0.97 mg/dL (ref 0.44–1.00)
GFR, Estimated: >60 mL/min (ref >60)
Glucose, Bld: 104 mg/dL — ABNORMAL HIGH (ref 70–99)
Potassium: 4 mmol/L (ref 3.5–5.1)
Sodium: 142 mmol/L (ref 135–145)

## 2021-12-27 LAB — CBC WITH DIFFERENTIAL/PLATELET
Abs Immature Granulocytes: 0.17 10*3/uL — ABNORMAL HIGH (ref 0.00–0.07)
Basophils Absolute: 0 10*3/uL (ref 0.0–0.1)
Basophils Relative: 0 %
Eosinophils Absolute: 0.4 10*3/uL (ref 0.0–0.5)
Eosinophils Relative: 3 %
HCT: 44.6 % (ref 36.0–46.0)
Hemoglobin: 13.9 g/dL (ref 12.0–15.0)
Immature Granulocytes: 1 %
Lymphocytes Relative: 22 %
Lymphs Abs: 2.7 10*3/uL (ref 0.7–4.0)
MCH: 27.6 pg (ref 26.0–34.0)
MCHC: 31.2 g/dL (ref 30.0–36.0)
MCV: 88.7 fL (ref 80.0–100.0)
Monocytes Absolute: 1 10*3/uL (ref 0.1–1.0)
Monocytes Relative: 9 %
Neutro Abs: 7.9 10*3/uL — ABNORMAL HIGH (ref 1.7–7.7)
Neutrophils Relative %: 65 %
Platelets: 221 10*3/uL (ref 150–400)
RBC: 5.03 MIL/uL (ref 3.87–5.11)
RDW: 15.1 % (ref 11.5–15.5)
WBC: 12.2 10*3/uL — ABNORMAL HIGH (ref 4.0–10.5)
nRBC: 0 % (ref 0.0–0.2)

## 2021-12-27 LAB — CBC
HCT: 44 % (ref 36.0–46.0)
Hemoglobin: 14 g/dL (ref 12.0–15.0)
MCH: 28.3 pg (ref 26.0–34.0)
MCHC: 31.8 g/dL (ref 30.0–36.0)
MCV: 88.9 fL (ref 80.0–100.0)
Platelets: 204 10*3/uL (ref 150–400)
RBC: 4.95 MIL/uL (ref 3.87–5.11)
RDW: 15 % (ref 11.5–15.5)
WBC: 12.6 10*3/uL — ABNORMAL HIGH (ref 4.0–10.5)
nRBC: 0 % (ref 0.0–0.2)

## 2021-12-27 LAB — HIV ANTIBODY (ROUTINE TESTING W REFLEX): HIV Screen 4th Generation wRfx: NONREACTIVE

## 2021-12-27 LAB — COMPREHENSIVE METABOLIC PANEL
ALT: 44 U/L (ref 0–44)
AST: 27 U/L (ref 15–41)
Albumin: 3.2 g/dL — ABNORMAL LOW (ref 3.5–5.0)
Alkaline Phosphatase: 43 U/L (ref 38–126)
Anion gap: 9 (ref 5–15)
BUN: 24 mg/dL — ABNORMAL HIGH (ref 8–23)
CO2: 26 mmol/L (ref 22–32)
Calcium: 9.1 mg/dL (ref 8.9–10.3)
Chloride: 106 mmol/L (ref 98–111)
Creatinine, Ser: 0.96 mg/dL (ref 0.44–1.00)
GFR, Estimated: >60 mL/min (ref >60)
Glucose, Bld: 103 mg/dL — ABNORMAL HIGH (ref 70–99)
Potassium: 3.9 mmol/L (ref 3.5–5.1)
Sodium: 141 mmol/L (ref 135–145)
Total Bilirubin: 0.5 mg/dL (ref 0.3–1.2)
Total Protein: 6.7 g/dL (ref 6.5–8.1)

## 2021-12-27 LAB — PROTIME-INR
INR: 1.3 — ABNORMAL HIGH (ref 0.8–1.2)
Prothrombin Time: 15.9 seconds — ABNORMAL HIGH (ref 11.4–15.2)

## 2021-12-27 LAB — PHOSPHORUS: Phosphorus: 3.3 mg/dL (ref 2.5–4.6)

## 2021-12-27 LAB — MAGNESIUM: Magnesium: 2.2 mg/dL (ref 1.7–2.4)

## 2021-12-27 SURGERY — CANCELLED PROCEDURE

## 2021-12-27 MED ORDER — SODIUM CHLORIDE 0.9 % IV SOLN
1.0000 g | INTRAVENOUS | Status: DC
  Administered 2021-12-27: 1 g via INTRAVENOUS
  Filled 2021-12-27: qty 10

## 2021-12-27 MED ORDER — SODIUM CHLORIDE 0.9 % IV SOLN: INTRAVENOUS | Status: DC

## 2021-12-27 MED ORDER — DILTIAZEM HCL ER COATED BEADS 120 MG PO CP24
120.0000 mg | ORAL_CAPSULE | Freq: Every day | ORAL | Status: DC
  Administered 2021-12-27: 120 mg via ORAL
  Filled 2021-12-27: qty 1

## 2021-12-27 MED ORDER — LOSARTAN POTASSIUM 25 MG PO TABS
25.0000 mg | ORAL_TABLET | Freq: Every day | ORAL | Status: DC
  Administered 2021-12-27: 25 mg via ORAL
  Filled 2021-12-27: qty 1

## 2021-12-27 MED ORDER — CEPHALEXIN 500 MG PO CAPS: 1000.0000 mg | ORAL_CAPSULE | Freq: Two times a day (BID) | ORAL | 0 refills | Status: AC

## 2021-12-27 NOTE — Progress Notes (Signed)
Rounding Note    Patient Name: Rebekah [name] Date of Encounter: 12/27/2021  [place] Cardiologist: Pixie Casino, MD   Subjective   Seems she is converted to sinus on IV Cardizem. She feels better. Get EKG.  Her daughter is a MD at [place].   Inpatient Medications    Scheduled Meds:  apixaban  5 mg Oral BID   busPIRone  5 mg Oral BID   flecainide  50 mg Oral BID   levothyroxine  50 mcg Oral Q0600   metoprolol tartrate  25 mg Oral BID   mirabegron ER  25 mg Oral Daily   Continuous Infusions:  sodium chloride 20 mL/hr at 12/27/21 0636   cefTRIAXone (ROCEPHIN)  IV     diltiazem (CARDIZEM) infusion 5 mg/hr (12/27/21 0027)   lactated ringers Stopped (12/26/21 1115)   PRN Meds: acetaminophen, ondansetron (ZOFRAN) IV   Vital Signs    Vitals:   12/26/21 2200 12/27/21 0020 12/27/21 0632 12/27/21 0640  BP: 106/77 108/71  (!) 124/94  Pulse:  70  61  Resp:  20    Temp:  98.5 F (36.9 C)  98.8 F (37.1 C)  TempSrc:  Oral  Oral  SpO2:  97%  98%  Weight:   119.1 kg   Height:        Intake/Output Summary (Last 24 hours) at 12/27/2021 0901 Last data filed at 12/27/2021 0419 Gross per 24 hour  Intake 154 ml  Output --  Net 154 ml      12/27/2021    6:32 AM 12/26/2021    4:40 PM 11/14/2021    8:10 AM  Last 3 Weights  Weight (lbs) 262 lb 9.1 oz 262 lb 5.6 oz 256 lb 6.4 oz  Weight (kg) 119.1 kg 119 kg 116.302 kg      Telemetry    Sinus rhythm with artifacts  - Personally Reviewed  ECG    Sinus rhythm - Personally Reviewed  Physical Exam   GEN: No acute distress.   Neck: No JVD Cardiac: RRR, no murmurs, rubs, or gallops.  Respiratory: Clear to auscultation bilaterally. GI: Soft, nontender, non-distended  MS: No edema; No deformity. Neuro:  Nonfocal  Psych: Normal affect   Labs    High Sensitivity Troponin:  No results for input(s): "TROPONINIHS" in the last 720 hours.   Chemistry Recent Labs  Lab 12/26/21 1012 12/26/21 1114 12/27/21 0529   NA 140 140 141  142  K 3.1* 3.4* 3.9  4.0  CL 103  --  106  107  CO2 27  --  26  28  GLUCOSE 103*  --  103*  104*  BUN 33*  --  24*  24*  CREATININE 0.99  --  0.96  0.97  CALCIUM 9.2  --  9.1  9.2  MG 2.0  --  2.2  PROT 7.0  --  6.7  ALBUMIN 3.3*  --  3.2*  AST 35  --  27  ALT 49*  --  44  ALKPHOS 45  --  43  BILITOT 0.7  --  0.5  GFRNONAA >60  --  >60  >60  ANIONGAP 10  --  9  7    Lipids No results for input(s): "CHOL", "TRIG", "HDL", "LABVLDL", "LDLCALC", "CHOLHDL" in the last 168 hours.  Hematology Recent Labs  Lab 12/26/21 1012 12/26/21 1114 12/27/21 0529  WBC 17.8*  --  12.6*  12.2*  RBC 5.12*  --  4.95  5.03  HGB 14.3 14.6 14.0  13.9  HCT 45.1 43.0 44.0  44.6  MCV 88.1  --  88.9  88.7  MCH 27.9  --  28.3  27.6  MCHC 31.7  --  31.8  31.2  RDW 14.6  --  15.0  15.1  PLT 234  --  204  221   Thyroid  Recent Labs  Lab 12/26/21 1012  TSH 3.060  FREET4 1.24*    BNP Recent Labs  Lab 12/26/21 1012  BNP 107.6*    DDimer No results for input(s): "DDIMER" in the last 168 hours.   Radiology    DG Chest 2 View  Result Date: 12/26/2021 CLINICAL DATA:  Cough, recent COVID positive state EXAM: CHEST - 2 VIEW COMPARISON:  07/20/2014 FINDINGS: Cardiac size is within normal limits. Thoracic aorta is tortuous and ectatic. Lung fields are clear of any infiltrate or pulmonary edema. There is no pleural effusion or pneumothorax. IMPRESSION: No active cardiopulmonary disease. Electronically Signed   By: Elmer Picker M.D.   On: 12/26/2021 10:47    Cardiac Studies   None this admission   Patient Profile     66 y.o. female with a hx of paroxysmal atrial fibrillation, morbid obesity, hypertension, GERD, and chronic hypoxia seen for atrial fibrillation with RVR in the setting of COVID-19 infection at the request of Dr. Florene Glen.  Assessment & Plan    1. Atrial fibrillation: - Patient reports recent COVID-19 infection and initiation of Paxlovid,  which she suspects contributed to the recurrence of AFib. - She received an extra dose of flecainide at the med center in [place] on IV cardizem and started on BB - She converted to sinus rhythm overnight. Cancelled TEE/DCCV.  - Continue Flecainide '50mg'$  BID - Continue Lopressor '25mg'$  BID - Switch IV cardizem to Cardizem CD '120mg'$  qd - Continue Eliquis '5mg'$  BID (off Paxlovid).   2. HTN - BP stable   DC later today once seen by MD.   For questions or updates, please contact [place] Please consult [email or web address] for contact info under        SignedLeanor Kail, PA  12/27/2021, 9:01 AM

## 2021-12-27 NOTE — Care Management Obs Status (Signed)
[place] NOTIFICATION   Patient Details  Name: Rebekah [name] MRN: [id number] Date of Birth: 14-Sep-1955   Medicare Observation Status Notification Given:  Yes    Zenon Mayo, RN 12/27/2021, 2:14 PM

## 2021-12-27 NOTE — TOC Progression Note (Addendum)
Transition of Care [facility]) - Progression Note    Patient Details  Name: Rebekah [name] MRN: [id number] Date of Birth: [birth year]  [facility] of [facility] CM/SW Contact  Zenon Mayo, RN Phone Number: 12/27/2021, 11:47 AM  Clinical Narrative:    from home with spouse, afib with RVR, for poss dc today, no needs. TOC following.        Expected Discharge Plan and Services                                                 Social Determinants of Health (SDOH) Interventions    Readmission Risk Interventions     No data to display

## 2021-12-27 NOTE — Care Management CC44 (Signed)
Condition Code 44 Documentation Completed  Patient Details  Name: KATY BRICKELL MRN: [id number] Date of Birth: 04-24-1955   Condition Code 44 given:  Yes Patient signature on Condition Code 44 notice:  Yes Documentation of 2 MD's agreement:  Yes Code 44 added to claim:  Yes    Zenon Mayo, RN 12/27/2021, 2:14 PM

## 2021-12-28 ENCOUNTER — Encounter: Payer: Self-pay | Admitting: Family Medicine

## 2021-12-28 DIAGNOSIS — F419 Anxiety disorder, unspecified: Secondary | ICD-10-CM

## 2021-12-28 NOTE — Discharge Summary (Addendum)
Physician Discharge Summary   Patient: Rebekah [name] MRN: [id number] DOB: 04-Nov-1955  Admit date:     12/26/2021  Discharge date: 12/27/2021  Discharge Physician: Alma Friendly   PCP: Janora Norlander, DO   Recommendations at discharge:   Follow-up with PCP  Discharge Diagnoses: Principal Problem:   Atrial fibrillation with rapid ventricular response (HCC) Active Problems:   Morbid obesity ([place])   Hypokalemia   Educated about COVID-19 virus infection   Atrial fibrillation [facility])   Atrial fibrillation with RVR [facility])    Hospital Course: Rebekah [name] is a 66 y.o. female with medical history significant of atrial fibrillation on eliquis, HTN, gerd, anxiety/depression and multiple other medical issues here with palpitations. Her symptoms started shortly after her recent COVID 19 infection.  She notes that she tested positive for COVID 19 on 9/4. She was presribed paxlovid and attributes her symptoms of worsening afib to this medicine. She presented to the ED with worsening SOB.  Admitted for further management   Patient eager to be discharged, adamant she does not want to stay another night despite urine culture still pending.  Patient will ensure PCP/urology will follow-up urine culture.    Assessment and Plan:  Paroxysmal atrial fibrillation with RVR Heart rate controlled, spontaneously converted back to sinus rhythm Appreciate cardiology assistance S/p dilt gtt, continue flecainide, diltiazem Continue Eliquis Cardiology follow-up  Klebsiella UTI Urine culture growing > 100,000 Klebsiella Received a dose of IV ceftriaxone Discharged on p.o. Keflex to complete 5 days Follow-up with PCP for susceptibility   COVID 19 infection Tested positive 9/4.  No clear infectious sx at this time   Hypertension Continue losartan and diltiazem   Hypothyroidism Synthroid   Overactive bladder Vibegron   Anxiety Buspar    Obesity BMI 45 On wegovy at home      Pain  control - [place] was reviewed. and patient was instructed, not to drive, operate heavy machinery, perform activities at heights, swimming or participation in water activities or provide baby-sitting services while on Pain, Sleep and Anxiety Medications; until their outpatient Physician has advised to do so again. Also recommended to not to take more than prescribed Pain, Sleep and Anxiety Medications.  Consultants: Cardiology Procedures performed: None Disposition: Home Diet recommendation:  Cardiac diet    DISCHARGE MEDICATION: Allergies as of 12/27/2021       Reactions   Beef-derived Products Anaphylaxis   After tick bite, cannot eat beef, pork or lamb   Lambs Quarters Anaphylaxis   After tick bite cannot eat beef, pork or lamb   Mucinex [guaifenesin Er] Anaphylaxis   Pork-derived Products Anaphylaxis   After tick bite cannot eat beef, pork or lamb   Darvon [propoxyphene] Other (See Comments)   Hallucinations   Ace Inhibitors Swelling, Cough   Pedal Edema   Ppd [tuberculin Purified Protein Derivative]    Always has positive testing to PPD, do not use   Hydromet [hydrocodone Bit-homatrop Mbr] Itching, Other (See Comments)   Severe stomach pain-face itches.    Sulfonamide Derivatives Rash        Medication List     TAKE these medications    acetaminophen 500 MG tablet Commonly known as: TYLENOL Take 500-1,000 mg by mouth every 6 (six) hours as needed for moderate pain or headache.   apixaban 5 MG Tabs tablet Commonly known as: Eliquis Take 1 tablet (5 mg total) by mouth 2 (two) times daily.   busPIRone 5 MG tablet  Commonly known as: BUSPAR Take 1 tablet (5 mg total) by mouth 2 (two) times daily for 7 days, THEN 1.5 tablets (7.5 mg total) 2 (two) times daily for 7 days, THEN 2 tablets (10 mg total) 2 (two) times daily. Start taking on: November 27, 2021   cephALEXin 500 MG capsule Commonly known as: KEFLEX Take 2  capsules (1,000 mg total) by mouth 2 (two) times daily for 4 days.   cetirizine 10 MG tablet Commonly known as: ZYRTEC Take 10 mg by mouth daily as needed for allergies.   clotrimazole-betamethasone lotion Commonly known as: LOTRISONE Apply topically 2 (two) times daily.   diltiazem 120 MG 24 hr capsule Commonly known as: CARDIZEM CD TAKE 1 CAPSULE BY MOUTH ONCE DAILY   diltiazem 30 MG tablet Commonly known as: CARDIZEM TAKE ONE TO TWO TABLETS BY MOUTH EVERY 6 HOURS AS NEEDED FOR  FAST  HEART  RATES   EPINEPHrine 0.3 mg/0.3 mL Soaj injection Commonly known as: EPI-PEN inject 0.70ms into the muscle once as needed for anaphylaxis   flecainide 50 MG tablet Commonly known as: TAMBOCOR Take 1 tablet (50 mg total) by mouth 2 (two) times daily.   furosemide 20 MG tablet Commonly known as: LASIX TAKE 1 TABLET BY MOUTH ONCE DAILY What changed:  when to take this reasons to take this   levothyroxine 50 MCG tablet Commonly known as: SYNTHROID TAKE ONE TABLET BY MOUTH DAILY BEFORE BREAKFAST   losartan 50 MG tablet Commonly known as: COZAAR Take 1 tablet (50 mg total) by mouth 2 (two) times daily.   MULTIVITAMIN ADULT PO Take 1 capsule by mouth daily.   OXYGEN Inhale 3 L/min into the lungs at bedtime. Continuously   promethazine-dextromethorphan 6.25-15 MG/5ML syrup Commonly known as: PROMETHAZINE-DM Take 5 mLs by mouth every 4 (four) hours as needed for nausea/vomiting.   triamcinolone cream 0.1 % Commonly known as: KENALOG Apply 1 Application topically 2 (two) times daily as needed (rash).   Vibegron 75 MG Tabs Take 75 mg by mouth. 1 tablet Daily   Vitamin D 50 MCG (2000 UT) Caps Take 2,000 Units by mouth daily.   Wegovy 2.4 MG/0.75ML Soaj Generic drug: Semaglutide-Weight Management Inject 2.4 mg into the skin once a week. Patient requests 3 month supply        Follow-up Information     [facility] BCrista Luria PUtahFollow up on 01/18/2022.   Specialty:  Cardiology Why: '@8am'$  for hospital follow up Contact information: [address] [phone]         Gottschalk, AOrchards DO. Schedule an appointment as soon as possible for a visit in 1 week(s).   Specialty: Family Medicine Contact information: [phone]        HPixie Casino MD .   Specialty: Cardiology Contact information: [phone]         HArdis Hughs MD. Call in 1 week(s).   Specialty: Urology Contact information: [place] [phone]               Discharge Exam: FDanley DankerWeights   12/26/21 1640 12/27/21 01448 Weight: 119 kg 119.1 kg   General: NAD  Cardiovascular: S1, S2 present Respiratory: CTAB Abdomen: Soft, nontender, nondistended, bowel sounds present Musculoskeletal: No bilateral pedal edema noted Skin: Normal Psychiatry: Normal mood   Condition at discharge: stable  The results of  significant diagnostics from this hospitalization (including imaging, microbiology, ancillary and laboratory) are listed below for reference.   Imaging Studies: DG Chest 2 View  Result Date: 12/26/2021 CLINICAL DATA:  Cough, recent COVID positive state EXAM: CHEST - 2 VIEW COMPARISON:  07/20/2014 FINDINGS: Cardiac size is within normal limits. Thoracic aorta is tortuous and ectatic. Lung fields are clear of any infiltrate or pulmonary edema. There is no pleural effusion or pneumothorax. IMPRESSION: No active cardiopulmonary disease. Electronically Signed   By: Elmer Picker M.D.   On: 12/26/2021 10:47    Microbiology: Results for orders placed or performed during the hospital encounter of 12/26/21  Urine Culture     Status: Abnormal (Preliminary result)   Collection Time: 12/27/21  7:50 AM   Specimen: Urine, Clean Catch  Result Value Ref Range Status   Specimen Description URINE, CLEAN  CATCH  Final   Special Requests NONE  Final   Culture (A)  Final    >=100,000 COLONIES/mL KLEBSIELLA PNEUMONIAE SUSCEPTIBILITIES TO FOLLOW Performed at [facility], [address] [place],  [address]    Report Status PENDING  Incomplete    Labs: CBC: Recent Labs  Lab 12/26/21 1012 12/26/21 1114 12/27/21 0529  WBC 17.8*  --  12.6*  12.2*  NEUTROABS 12.2*  --  7.9*  HGB 14.3 14.6 14.0  13.9  HCT 45.1 43.0 44.0  44.6  MCV 88.1  --  88.9  88.7  PLT 234  --  204  177   Basic Metabolic Panel: Recent Labs  Lab 12/26/21 1012 12/26/21 1114 12/27/21 0529  NA 140 140 141  142  K 3.1* 3.4* 3.9  4.0  CL 103  --  106  107  CO2 27  --  26  28  GLUCOSE 103*  --  103*  104*  BUN 33*  --  24*  24*  CREATININE 0.99  --  0.96  0.97  CALCIUM 9.2  --  9.1  9.2  MG 2.0  --  2.2  PHOS  --   --  3.3   Liver Function Tests: Recent Labs  Lab 12/26/21 1012 12/27/21 0529  AST 35 27  ALT 49* 44  ALKPHOS 45 43  BILITOT 0.7 0.5  PROT 7.0 6.7  ALBUMIN 3.3* 3.2*   CBG: No results for input(s): "GLUCAP" in the last 168 hours.  Discharge time spent: less than 30 minutes.  Signed: Alma Friendly, MD [facility] 12/28/2021

## 2021-12-29 ENCOUNTER — Other Ambulatory Visit: Payer: Self-pay | Admitting: Family Medicine

## 2021-12-29 DIAGNOSIS — F419 Anxiety disorder, unspecified: Secondary | ICD-10-CM

## 2021-12-29 LAB — URINE CULTURE: Culture: >=100000 — AB

## 2021-12-29 MED ORDER — BUSPIRONE HCL 10 MG PO TABS: 10.0000 mg | ORAL_TABLET | Freq: Two times a day (BID) | ORAL | 0 refills | Status: DC

## 2022-01-16 ENCOUNTER — Telehealth: Payer: Medicare PPO | Admitting: Nurse Practitioner

## 2022-01-16 ENCOUNTER — Encounter: Payer: Self-pay | Admitting: *Deleted

## 2022-01-16 ENCOUNTER — Encounter: Payer: Self-pay | Admitting: Nurse Practitioner

## 2022-01-16 ENCOUNTER — Encounter: Payer: Self-pay | Admitting: Family Medicine

## 2022-01-16 DIAGNOSIS — L03031 Cellulitis of right toe: Secondary | ICD-10-CM | POA: Diagnosis not present

## 2022-01-16 DIAGNOSIS — L02611 Cutaneous abscess of right foot: Secondary | ICD-10-CM | POA: Diagnosis not present

## 2022-01-16 MED ORDER — DOXYCYCLINE HYCLATE 100 MG PO TABS: 100.0000 mg | ORAL_TABLET | Freq: Two times a day (BID) | ORAL | 0 refills | Status: DC

## 2022-01-16 NOTE — Progress Notes (Signed)
Virtual Visit Consent   Rebekah [name], you are scheduled for a virtual visit with Mary-Margaret Hassell Done, [place], a [facility] provider, today.     Just as with appointments in the office, your consent must be obtained to participate.  Your consent will be active for this visit and any virtual visit you may have with one of our providers in the next 365 days.     If you have a MyChart account, a copy of this consent can be sent to you electronically.  All virtual visits are billed to your insurance company just like a traditional visit in the office.    As this is a virtual visit, video technology does not allow for your provider to perform a traditional examination.  This may limit your provider's ability to fully assess your condition.  If your provider identifies any concerns that need to be evaluated in person or the need to arrange testing (such as labs, EKG, etc.), we will make arrangements to do so.     Although advances in technology are sophisticated, we cannot ensure that it will always work on either your end or our end.  If the connection with a video visit is poor, the visit may have to be switched to a telephone visit.  With either a video or telephone visit, we are not always able to ensure that we have a secure connection.     I need to obtain your verbal consent now.   Are you willing to proceed with your visit today? YES   Rebekah [name] has provided verbal consent on 01/16/2022 for a virtual visit (video or telephone).   Mary-Margaret Hassell Done, FNP   Date: 01/16/2022 9:53 AM   Virtual Visit via Video Note   I, Mary-Margaret Hassell Done, connected with Rebekah [name] ([id number], Feb 13, 1956) on 01/16/22 at 10:50 AM EDT by a video-enabled telemedicine application and verified that I am speaking with the correct person using two identifiers.  Location: Patient: Virtual Visit Location Patient: Home Provider: Virtual Visit Location Provider: Mobile   I discussed the limitations of  evaluation and management by telemedicine and the availability of in person appointments. The patient expressed understanding and agreed to proceed.    History of Present Illness: Rebekah [name] is a 66 y.o. who identifies as a female who was assigned female at birth, and is being seen today for bruising and swelling of foot.  HPI: Patient states that her left foot is red an swollen. Slightly tender to touch. She does not remember an injury. Mildly warm to touch.    Review of Systems  Constitutional:  Negative for fever.  Musculoskeletal:        Right foot  All other systems reviewed and are negative.   Problems:  Patient Active Problem List   Diagnosis Date Noted   Atrial fibrillation with RVR ([place]) 12/27/2021   Atrial fibrillation with rapid ventricular response ([place]) 12/26/2021   Atrial fibrillation ([place]) 12/26/2021   Hypokalemia    Educated about COVID-19 virus infection    Cystocele with uterine prolapse 10/27/2018   Nocturia 12/13/2017   OAB (overactive bladder) 12/13/2017   Elevated LFTs 05/16/2017   Vitamin D deficiency 05/02/2017   Other hyperlipidemia 05/02/2017   Other fatigue 01/01/2017   Shortness of breath on exertion 01/01/2017   Other specified hypothyroidism 01/01/2017   Chronic atrial fibrillation ([place]) 01/01/2017   Obese 05/20/2015   S/P right TKA 05/17/2015   S/P knee replacement 05/17/2015   Paroxysmal A-fib (  [place]) 07/20/2014   Dyslipidemia 01/06/2014   Arthritis 01/06/2014   S/P laparoscopic sleeve gastrectomy 01/04/14 01/06/2014   Postoperative atrial fibrillation - resolved 01/06/2014   Morbid obesity ([place]) 01/04/2014   Dyspnea on exertion 09/05/2012   Essential hypertension 04/21/2007    Allergies:  Allergies  Allergen Reactions   Beef-Derived Products Anaphylaxis    After tick bite, cannot eat beef, pork or lamb   Lambs Quarters Anaphylaxis    After tick bite cannot eat beef, pork or lamb   Mucinex [Guaifenesin Er] Anaphylaxis   Pork-Derived  Products Anaphylaxis    After tick bite cannot eat beef, pork or lamb   Darvon [Propoxyphene] Other (See Comments)    Hallucinations    Ace Inhibitors Swelling and Cough    Pedal Edema   Ppd [Tuberculin Purified Protein Derivative]     Always has positive testing to PPD, do not use   Hydromet [Hydrocodone Bit-Homatrop Mbr] Itching and Other (See Comments)    Severe stomach pain-face itches.    Sulfonamide Derivatives Rash   Medications:  Current Outpatient Medications:    acetaminophen (TYLENOL) 500 MG tablet, Take 500-1,000 mg by mouth every 6 (six) hours as needed for moderate pain or headache., Disp: , Rfl:    apixaban (ELIQUIS) 5 MG TABS tablet, Take 1 tablet (5 mg total) by mouth 2 (two) times daily., Disp: 180 tablet, Rfl: 3   busPIRone (BUSPAR) 10 MG tablet, Take 1 tablet (10 mg total) by mouth 2 (two) times daily., Disp: 180 tablet, Rfl: 0   cetirizine (ZYRTEC) 10 MG tablet, Take 10 mg by mouth daily as needed for allergies., Disp: , Rfl:    Cholecalciferol (VITAMIN D) 50 MCG (2000 UT) CAPS, Take 2,000 Units by mouth daily., Disp: , Rfl:    clotrimazole-betamethasone (LOTRISONE) lotion, Apply topically 2 (two) times daily., Disp: , Rfl:    diltiazem (CARDIZEM CD) 120 MG 24 hr capsule, TAKE 1 CAPSULE BY MOUTH ONCE DAILY, Disp: 90 capsule, Rfl: 3   diltiazem (CARDIZEM) 30 MG tablet, TAKE ONE TO TWO TABLETS BY MOUTH EVERY 6 HOURS AS NEEDED FOR  FAST  HEART  RATES, Disp: 30 tablet, Rfl: 3   EPINEPHRINE 0.3 mg/0.3 mL IJ SOAJ injection, inject 0.61ms into the muscle once as needed for anaphylaxis, Disp: 2 each, Rfl: 3   flecainide (TAMBOCOR) 50 MG tablet, Take 1 tablet (50 mg total) by mouth 2 (two) times daily., Disp: 180 tablet, Rfl: 3   furosemide (LASIX) 20 MG tablet, TAKE 1 TABLET BY MOUTH ONCE DAILY (Patient taking differently: Take 20 mg by mouth daily as needed for fluid or edema.), Disp: 90 tablet, Rfl: 3   levothyroxine (SYNTHROID) 50 MCG tablet, TAKE ONE TABLET BY MOUTH DAILY  BEFORE BREAKFAST, Disp: 90 tablet, Rfl: 2   losartan (COZAAR) 50 MG tablet, Take 1 tablet (50 mg total) by mouth 2 (two) times daily., Disp: 180 tablet, Rfl: 3   Multiple Vitamins-Minerals (MULTIVITAMIN ADULT PO), Take 1 capsule by mouth daily., Disp: , Rfl:    OXYGEN, Inhale 3 L/min into the lungs at bedtime. Continuously, Disp: , Rfl:    promethazine-dextromethorphan (PROMETHAZINE-DM) 6.25-15 MG/5ML syrup, Take 5 mLs by mouth every 4 (four) hours as needed for nausea/vomiting., Disp: , Rfl:    Semaglutide-Weight Management (WEGOVY) 2.4 MG/0.75ML SOAJ, Inject 2.4 mg into the skin once a week. Patient requests 3 month supply, Disp: 9 mL, Rfl: 3   triamcinolone cream (KENALOG) 0.1 %, Apply 1 Application topically 2 (two) times daily as needed (rash).,  Disp: 80 g, Rfl: 0   Vibegron 75 MG TABS, Take 75 mg by mouth. 1 tablet Daily, Disp: , Rfl:   Observations/Objective: Patient is well-developed, well-nourished in no acute distress.  Resting comfortably  at home.  Head is normocephalic, atraumatic.  No labored breathing.  Speech is clear and coherent with logical content.  Patient is alert and oriented at baseline.  Right foot mild erythema and edema- slightly warm to touch  Assessment and Plan:  Rebekah [name] in today with chief complaint of No chief complaint on file.   1. Cellulitis and abscess of toe of right foot Elevate foot  Cool compresses RTO prn  Meds ordered this encounter  Medications   doxycycline (VIBRA-TABS) 100 MG tablet    Sig: Take 1 tablet (100 mg total) by mouth 2 (two) times daily. 1 po bid    Dispense:  20 tablet    Refill:  0    Order Specific Question:   Supervising Provider    Answer:   Caryl Pina A [[id number]]      Follow Up Instructions: I discussed the assessment and treatment plan with the patient. The patient was provided an opportunity to ask questions and all were answered. The patient agreed with the plan and demonstrated an understanding of  the instructions.  A copy of instructions were sent to the patient via MyChart.  The patient was advised to call back or seek an in-person evaluation if the symptoms worsen or if the condition fails to improve as anticipated.  Time:  I spent 14 minutes with the patient via telehealth technology discussing the above problems/concerns.    Mary-Margaret Hassell Done, FNP

## 2022-01-16 NOTE — Patient Instructions (Signed)

## 2022-01-18 ENCOUNTER — Ambulatory Visit: Payer: Medicare PPO | Admitting: Physician Assistant

## 2022-02-01 ENCOUNTER — Encounter (HOSPITAL_BASED_OUTPATIENT_CLINIC_OR_DEPARTMENT_OTHER): Payer: Self-pay | Admitting: Internal Medicine

## 2022-02-15 ENCOUNTER — Ambulatory Visit: Payer: Medicare PPO | Admitting: Physician Assistant

## 2022-02-26 ENCOUNTER — Ambulatory Visit: Payer: Medicare PPO | Attending: Physician Assistant | Admitting: Internal Medicine

## 2022-02-26 ENCOUNTER — Encounter: Payer: Self-pay | Admitting: Internal Medicine

## 2022-02-26 VITALS — BP 128/90 | HR 99 | Ht 61.0 in | Wt 264.8 lb

## 2022-02-26 DIAGNOSIS — I4510 Unspecified right bundle-branch block: Secondary | ICD-10-CM

## 2022-02-26 DIAGNOSIS — R0602 Shortness of breath: Secondary | ICD-10-CM

## 2022-02-26 DIAGNOSIS — I480 Paroxysmal atrial fibrillation: Secondary | ICD-10-CM

## 2022-02-26 DIAGNOSIS — R000 Tachycardia, unspecified: Secondary | ICD-10-CM | POA: Diagnosis not present

## 2022-02-26 NOTE — Patient Instructions (Signed)
Medication Instructions:  The current medical regimen is effective;  continue present plan and medications.  *If you need a refill on your cardiac medications before your next appointment, please call your pharmacy*   Testing/Procedures: Echocardiogram - Your physician has requested that you have an echocardiogram. Echocardiography is a painless test that uses sound waves to create images of your heart. It provides your doctor with information about the size and shape of your heart and how well your heart's chambers and valves are working. This procedure takes approximately one hour. There are no restrictions for this procedure.     Follow-Up: At [facility], you and your health needs are our priority.  As part of our continuing mission to provide you with exceptional heart care, we have created designated Provider Care Teams.  These Care Teams include your primary Cardiologist (physician) and Advanced Practice Providers (APPs -  Physician Assistants and Nurse Practitioners) who all work together to provide you with the care you need, when you need it.  We recommend signing up for the patient portal called "MyChart".  Sign up information is provided on this After Visit Summary.  MyChart is used to connect with patients for Virtual Visits (Telemedicine).  Patients are able to view lab/test results, encounter notes, upcoming appointments, etc.  Non-urgent messages can be sent to your provider as well.   To learn more about what you can do with MyChart, go to [email or web address].    Your next appointment:   6 month(s)  The format for your next appointment:   In Person  Provider:   Pixie Casino, MD

## 2022-02-26 NOTE — Progress Notes (Signed)
OFFICE NOTE  Chief Complaint:  Occasional palpitations  Primary Care Physician: Janora Norlander, DO  HPI:  ROSE-MARIE HICKLING is a pleasant 66 year old female with a past medical history significant for her super morbid obesity, chronic hypoxia, hypertension, GERD and prior knee replacement. She was previously followed by Dr. Rollene Fare and was being evaluated for bariatric surgery. She is here to establish new care with me today and for preoperative evaluation. 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 She appears he seen Dr. Rollene Fare will order an echocardiogram on August of 2014 which showed moderate thickening of the left ventricle with EF of 60-65%, mildly increased RV systolic pressure and no significant valvular disease. There was left atrial enlargement. She denies any chest pain but does have expected shortness of breath with exertion. Lower extremity venous Dopplers were performed which show no evidence of significant venous insufficiency.    Mrs. Ferre returns today for follow-up. She seems to be doing remarkably well. She underwent gastric sleeve surgery and has had close to 60 pound weight loss. Unfortunately she was complicated by postoperative atrial fibrillation. She was seen by Dr. Martinique and placed on Cardizem. Subsequently she converted to sinus rhythm within 24 hours spontaneously. She's had no recurrence since that time and has not been additionally anticoagulated. She was not continued on Cardizem. She does have an elevated CHADSVASC score of 2.  I saw Mrs. Carre back in the office today. Please see her recent notes when I saw her in the emergency room at [facility]. She was noted to be in atrial fibrillation again and given flecainide to help convert her, but this was not successful. She is placed on diltiazem and in addition to  metoprolol. Rate slowed down and eventually she cardioverted on her own. We also started her on Xarelto which she's continues to take. Overall she's had very good rate control his heart rate is remained in the mid 40s to mid 60s. Blood pressure is somewhat labile and ranges between the low 90s up to the 140s. Overall she feels fairly well. She still has a few episodes of palpitations during the week when she feels that she is in A. fib although she feels like she is in A. fib today and her rhythm is sinus. She may not be sensitive to A. fib anymore. 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  I saw Mrs. Preiss back in the office today. She's had remarkable weight loss. In fact her weight after sleeve gastrectomy is down 288 pounds. Her main issue now is excess tissue for which she's tried to get plastic surgery but has been denied multiple times. She reports no further palpitations or atrial fibrillation. She continues to take Xarelto without any bleeding complications. She continues to use nocturnal oxygen due to hypoxia, although her sleep study indicated no sleep apnea, her overnight oximetry study did indicate hypoxia with a low O2 sats ration of 86%. This was, of course prior to the recent 80-100 pound weight loss. She also reports recently she's been having epistaxis. In fact she had an episode that lasted for 10-12 hours, she used a box of 40 micro-tampons to try to get it to stop and then eventually tried to go to the emergency room. She is using nasal saline but feels that the oxygen at night is probably drying  her nose out.  Glennda was seen today for an urgent add-on. This morning she was awakened by her alarm clock around 5 AM and noticed that her heart was racing when she woke up. It did not slow down right away and she felt increasingly more fatigue. She said after taking a shower she felt dizzy and had some chest pressure. Her heart rate was very elevated and she tried to take  it noting it was "greater than 200". She decided then take 300 mg of flecainide. Subsequently she took 2 diltiazem's and after several hours he decided to come in for her appointment with the orthopedist. She says that on the way in to the office her heart rate slowed down and she felt better. When she saw her orthopedist they felt that she should be reevaluated by [name] here. Currently she is asymptomatic. EKG was performed showing sinus rhythm with sinus arrhythmia at 81.  Mrs. Klimas returns today for follow-up. She had been reporting some worsening leg swelling however this seems to be improved somewhat today. She has had no recurrent paroxysmal arrhythmias. She continues to want to use the pocket pill strategy.  11/21/2016  Mrs. Rappleye returns a for follow-up. Is been over year since I last saw her. She was last seen in the A. fib clinic for follow-up and is reported very infrequent use of flecainide. She says that she's used it a few times over the past year and has been successful in converting her to sinus rhythm. Of most notable is that she's had significant weight gain. Weight in March 2017 was 188 pounds and now up to 271 pounds. She was 204 pounds in January. She says this is typical to many factors including immobility after foot surgery, social stressors, brother who had a stroke and poor eating habits. She has a significant understanding of nutrition and her dietary needs but has had difficulty balancing that. She reports that she is consuming only 600-700 cal a day and has lost 20 pounds but has recently plateaued.  03/15/2017  Mrs. Nichter returns today for follow-up.  I last saw her about 3 months ago.  We were working on her blood pressure which has been elevated.  Mostly it appears that her blood pressure is elevated in the morning.  She does not have sleep apnea however has had infrequent nocturnal oxygen desaturations and uses oxygen at night.  This may be contributing to her elevated blood  pressure in the morning.  Recently she has been taking extra losartan 50 mg at night but then takes 25 mg in the morning.  I reviewed her blood home blood pressures and they appear to be well controlled between 275-170 systolic and the 01V and 49S diastolic.  She reports infrequent atrial fibrillation.  The episodes however now are longer lasting and may last throughout the day, despite taking her flecainide.  They eventually go away however it is not clear if it works as quickly.  We considered and discussed monitoring and I presented to her an option of using the alive core application.  01/03/2018  Mrs. Wahab is seen today in follow-up.  She reported recently having had some occasional palpitations.  She thought she experienced an allergic reaction and took up 200 mg of Benadryl over 4 hours.  Absolutely she had some A. fib and then she took her flecainide within 24 hours.  I strongly advised against this at this point as there is significant interaction between Benadryl and flecainide which could potentiate  the levels of flecainide and potentially cause QT prolongation.  He had considered using her EpiPen, and I supported this if she ever feels that she may be having anaphylaxis.  Overall, she denies any chest pain or worsening shortness of breath.  She is been working with the weight management center and unfortunately has not lost a lot of weight but recently started on the original Atkins diet and has had some benefit there.  Blood pressures were running slightly higher and she increased her medicine dose to 50 mg twice daily of the losartan.  I reviewed her home blood pressure readings and the appear to be well controlled.  She is tolerating Xarelto without any difficulty.  And as I mentioned she is only had a couple of episodes of A. fib for which she has needed the pocket pill flecainide.  12/23/2018  Ms. Waguespack is seen today in follow-up.  She has had a couple episodes of persistent atrial  fibrillation despite taking flecainide and diltiazem.  Overall though she says her episodes are short and typically occur less than 1 time a month.  Her blood pressure is elevated today however she feels there is a whitecoat component to this.  Her home blood pressures were reviewed and seem to be normal.  Recently she has had some readings suggestive of some daytime hypoxia.  She continues to use some oxygen at night although feels no different on the days she does not use it.  She did have a repeat echocardiogram which showed normal systolic function and no evidence of pulmonary hypertension.  For some reason her oxygen saturations have improved recently.  She continues to lose some weight and is working with Dr. Leafy Ro.    08/21/2021  Ms. Turnipseed returns today for follow-up.  Recently she saw Leonia Reader, DNP who started her on daily flecainide at my direction because of increases in her palpitations and pocket pill use of high-dose flecainide.  Currently she is on 50 mg twice daily.  In general she says her palpitations have actually improved.  She is also not needed breakthrough diltiazem.  I advised her to not use the breakthrough flecainide since she is on daily dosing.  She has continued struggle with weight.  She is working with [employer].D. in her practice on some medical therapy for this.  She was previously seeing Dr. Leafy Ro however felt like the weight loss center recommendations did not work well with her prior gastric bypass surgery.  She just underwent stress testing which was negative for ischemia and showed normal LV function.  02/26/2022  Ms. Lada returns today for follow-up.  Back in September she was hospitalized with COVID.  She was given Paxlovid and says that she had development of tachycardia and atrial fibrillation.  She was given an extra dose of flecainide and ultimately converted within about 24 hours just prior to plans for TEE guided cardioversion.  Since then she is recovered  although does feel fatigue and some shortness of breath.  She says she does not sleep well.  She has some mild persistent cough.  She had several episodes of short breakthrough A-fib for a few weeks but over the past month she has done well without it.  She has noted that her heart rate remains elevated.  PMHx:  Past Medical History:  Diagnosis Date   Allergy    Multiple   Anxiety    Arthritis    Bilateral bunions    Cataract    Both eyes- lenses replaced  Chronic kidney disease 2022   Not kidney disease but kidney stone   Complication of anesthesia    Depression    More anxiety-worry-stress   Difficulty sleeping    prior sleep study did not reveal sleep apnea per patient   DJD (degenerative joint disease)    Dyspnea    Dysrhythmia    a-fib   Environmental allergies    Food allergy    Gallbladder problem    GERD (gastroesophageal reflux disease)    Heart murmur    History of kidney stones    Hypertension    no meds after weight loss   Hypothyroidism    Incontinence of urine    at nite    Joint pain    Left atrial dilatation    Left foot pain    Leg edema    Obesity    s/p gastric sleeve 12/2013 (previously weighed close to 400 lbs)   Osteoarthritis    Oxygen deficiency    3L/min for sleep due to slowed reaperations during sleep   Palpitations    Paroxysmal atrial fibrillation (HCC)    Pneumonia    hx of    PONV (postoperative nausea and vomiting)    Status post bilateral knee replacements    Supplemental oxygen dependent    uses 2l/[place] at night, states HR goes low and O2 drops   Tuberculosis    had 6 month of INH due to exposure    Vaginal vault prolapse     Past Surgical History:  Procedure Laterality Date   APPENDECTOMY     CHOLECYSTECTOMY     CYSTOSCOPY/URETEROSCOPY/HOLMIUM LASER/STENT PLACEMENT Left 09/29/2020   Procedure: CYSTOSCOPY WITH LEFT RETROGRADE PYELOGRAM AND LEFT URETEROSCOPY/HOLMIUM LASER STONE REMOVAL LEFT /STENT PLACEMENT;  Surgeon:  Ardis Hughs, MD;  Location: [facility];  Service: Urology;  Laterality: Left;   EYE SURGERY     03/2014 lens implant left lens    FOOT ARTHRODESIS Left 03/15/2016   Procedure: TALONAVICULAR AND SUBTALAR ARTHRODESIS;  Surgeon: Wylene Simmer, MD;  Location: [facility];  Service: Orthopedics;  Laterality: Left;   GASTROC RECESSION EXTREMITY Left 03/15/2016   Procedure: LEFT GASTROC RECESSION;  Surgeon: Wylene Simmer, MD;  Location: [facility];  Service: Orthopedics;  Laterality: Left;   JOINT REPLACEMENT  04/16/1997   L TOTAL KNEE   LAPAROSCOPIC GASTRIC SLEEVE RESECTION N/A 01/04/2014   Procedure: LAPAROSCOPIC GASTRIC SLEEVE RESECTION;  Surgeon: Greer Pickerel, MD;  Location: [facility];  Service: General;  Laterality: N/A;   TONSILLECTOMY     TOTAL KNEE ARTHROPLASTY Right 05/17/2015   Procedure: RIGHT TOTAL KNEE ARTHROPLASTY;  Surgeon: Paralee Cancel, MD;  Location: [facility];  Service: Orthopedics;  Laterality: Right;   TRANSTHORACIC ECHOCARDIOGRAM  11/14/2012   EF 60-65%, mod LVH & mod conc hypertrophy, grade 1 diastolic dysfunction; LA mildly dilated; RV systolic pressure increased; PA peak pressure 38mHg   TUBAL LIGATION     UPPER GI ENDOSCOPY  01/04/2014   Procedure: UPPER GI ENDOSCOPY;  Surgeon: EGreer Pickerel MD;  Location: [facility];  Service: General;;    FAMHx:  Family History  Problem Relation Age of Onset   Diabetes Father    Hyperlipidemia Father    Hypertension Father    Heart disease Father    Sudden death Father    Anxiety disorder Father    Obesity Father    Varicose Veins Father    Uterine cancer Mother    Cervical cancer Mother    Breast cancer  Mother    Cancer Mother        cervical and brreast cancer   Hypertension Mother    Hyperlipidemia Mother    Obesity Mother    Arthritis Mother    Diabetes Maternal Grandmother    Obesity Maternal Grandmother    Diabetes Brother    Hypertension Brother    Stroke Brother    Hypertension Brother     Obesity Daughter     SOCHx:   reports that she has never smoked. She has never been exposed to tobacco smoke. She has never used smokeless tobacco. She reports that she does not drink alcohol and does not use drugs.  ALLERGIES:  Allergies  Allergen Reactions   Beef-Derived Products Anaphylaxis    After tick bite, cannot eat beef, pork or lamb   Lambs Quarters Anaphylaxis    After tick bite cannot eat beef, pork or lamb   Mucinex [Guaifenesin Er] Anaphylaxis   Pork-Derived Products Anaphylaxis    After tick bite cannot eat beef, pork or lamb   Darvon [Propoxyphene] Other (See Comments)    Hallucinations    Ace Inhibitors Swelling and Cough    Pedal Edema   Ppd [Tuberculin Purified Protein Derivative]     Always has positive testing to PPD, do not use   Hydromet [Hydrocodone Bit-Homatrop Mbr] Itching and Other (See Comments)    Severe stomach pain-face itches.    Sulfonamide Derivatives Rash    ROS: Pertinent items noted in HPI and remainder of comprehensive ROS otherwise negative.  HOME MEDS: Current Outpatient Medications  Medication Sig Dispense Refill   acetaminophen (TYLENOL) 500 MG tablet Take 500-1,000 mg by mouth every 6 (six) hours as needed for moderate pain or headache.     apixaban (ELIQUIS) 5 MG TABS tablet Take 1 tablet (5 mg total) by mouth 2 (two) times daily. 180 tablet 3   busPIRone (BUSPAR) 10 MG tablet Take 1 tablet (10 mg total) by mouth 2 (two) times daily. 180 tablet 0   cetirizine (ZYRTEC) 10 MG tablet Take 10 mg by mouth daily as needed for allergies.     Cholecalciferol (VITAMIN D) 50 MCG (2000 UT) CAPS Take 2,000 Units by mouth daily.     diltiazem (CARDIZEM CD) 120 MG 24 hr capsule TAKE 1 CAPSULE BY MOUTH ONCE DAILY 90 capsule 3   diltiazem (CARDIZEM) 30 MG tablet TAKE ONE TO TWO TABLETS BY MOUTH EVERY 6 HOURS AS NEEDED FOR  FAST  HEART  RATES 30 tablet 3   EPINEPHRINE 0.3 mg/0.3 mL IJ SOAJ injection inject 0.26ms into the muscle once as needed for  anaphylaxis 2 each 3   flecainide (TAMBOCOR) 50 MG tablet Take 1 tablet (50 mg total) by mouth 2 (two) times daily. 180 tablet 3   furosemide (LASIX) 20 MG tablet TAKE 1 TABLET BY MOUTH ONCE DAILY (Patient taking differently: Take 20 mg by mouth daily as needed for fluid or edema.) 90 tablet 3   levothyroxine (SYNTHROID) 50 MCG tablet TAKE ONE TABLET BY MOUTH DAILY BEFORE BREAKFAST 90 tablet 2   losartan (COZAAR) 50 MG tablet Take 1 tablet (50 mg total) by mouth 2 (two) times daily. 180 tablet 3   Multiple Vitamins-Minerals (MULTIVITAMIN ADULT PO) Take 1 capsule by mouth daily.     OXYGEN Inhale 3 L/min into the lungs at bedtime. Continuously     Semaglutide-Weight Management (WEGOVY) 2.4 MG/0.75ML SOAJ Inject 2.4 mg into the skin once a week. Patient requests 3 month supply 9 mL 3  Vibegron 75 MG TABS Take 75 mg by mouth. 1 tablet Daily     No current facility-administered medications for this visit.    LABS/IMAGING: No results found for this or any previous visit (from the past 48 hour(s)). No results found.  VITALS: BP (!) 128/90 (BP Location: Left Arm, Patient Position: Sitting)   Pulse 99   Ht '5\' 1"'$  (1.549 m)   Wt 264 lb 12.8 oz (120.1 kg)   SpO2 99%   BMI 50.03 kg/m   EXAM: General appearance: alert, no distress and morbidly obese Neck: no carotid bruit, no JVD and thyroid not enlarged, symmetric, no tenderness/mass/nodules Lungs: clear to auscultation bilaterally Heart: regular rate and rhythm, S1, S2 normal, no murmur, click, rub or gallop Abdomen: soft, non-tender; bowel sounds normal; no masses,  no organomegaly Extremities: No ankle edema, excess tissue and firmness around the upper calf and knee areas Pulses: 2+ and symmetric Skin: Skin color, texture, turgor normal. No rashes or lesions Neurologic: Grossly normal Psych: Pleasant  EKG: Sinus rhythm with sinus arrhythmia at 99, RBBB-personally reviewed  ASSESSMENT: Paroxysmal atrial fibrillation, recurrent- on  twice daily flecainide therapy Bilateral leg edema - minimal Morbid obesity with significant weight gain and prior bariatric surgery Essential hypertension Intermittent RBBB  PLAN: 1.    Mrs. Jester has had some shortness of breath and intermittent palpitations but this is improved.  She is noted to have a right bundle branch block today.  When she was in A-fib with RVR back in September she was also noted to have a right bundle branch block but this was not noted at lower rates and might be rate dependent.  She has not had an echo since 2020.  Given her shortness of breath and tachycardia as well as recent COVID-19 infection, we will repeat an echocardiogram.  No medicine changes today.  Follow-up in 6 months or sooner as necessary.  Pixie Casino, MD, [facility], [place] Director of the [facility] Lipid Disorders &  Cardiovascular Risk Reduction Clinic Diplomate of the American Board of Clinical Lipidology Attending Cardiologist  Direct Dial: [phone]  Fax: [phone]  Website:  [email or web address] 02/26/2022, 9:55 AM

## 2022-03-01 ENCOUNTER — Encounter: Payer: Self-pay | Admitting: Internal Medicine

## 2022-03-22 ENCOUNTER — Encounter: Payer: Self-pay | Admitting: Internal Medicine

## 2022-03-23 ENCOUNTER — Ambulatory Visit (HOSPITAL_COMMUNITY): Payer: Medicare PPO | Attending: Internal Medicine

## 2022-03-23 DIAGNOSIS — R000 Tachycardia, unspecified: Secondary | ICD-10-CM | POA: Insufficient documentation

## 2022-03-23 DIAGNOSIS — R0602 Shortness of breath: Secondary | ICD-10-CM | POA: Diagnosis not present

## 2022-03-23 LAB — ECHOCARDIOGRAM COMPLETE
Area-P 1/2: 2.86 cm2
S' Lateral: 3.6 cm

## 2022-03-27 MED ORDER — DILTIAZEM HCL ER COATED BEADS 240 MG PO CP24: 240.0000 mg | ORAL_CAPSULE | Freq: Every day | ORAL | 3 refills | Status: DC

## 2022-04-11 ENCOUNTER — Other Ambulatory Visit: Payer: Self-pay

## 2022-04-11 DIAGNOSIS — F419 Anxiety disorder, unspecified: Secondary | ICD-10-CM

## 2022-04-11 MED ORDER — BUSPIRONE HCL 10 MG PO TABS: 10.0000 mg | ORAL_TABLET | Freq: Two times a day (BID) | ORAL | 0 refills | Status: DC

## 2022-04-25 ENCOUNTER — Other Ambulatory Visit: Payer: Medicare PPO

## 2022-04-25 ENCOUNTER — Telehealth: Payer: Self-pay | Admitting: Family Medicine

## 2022-04-25 ENCOUNTER — Encounter: Payer: Self-pay | Admitting: Family Medicine

## 2022-04-25 DIAGNOSIS — M5136 Other intervertebral disc degeneration, lumbar region: Secondary | ICD-10-CM | POA: Diagnosis not present

## 2022-04-25 DIAGNOSIS — E559 Vitamin D deficiency, unspecified: Secondary | ICD-10-CM

## 2022-04-25 DIAGNOSIS — Z8619 Personal history of other infectious and parasitic diseases: Secondary | ICD-10-CM | POA: Diagnosis not present

## 2022-04-25 DIAGNOSIS — I480 Paroxysmal atrial fibrillation: Secondary | ICD-10-CM

## 2022-04-25 DIAGNOSIS — M154 Erosive (osteo)arthritis: Secondary | ICD-10-CM | POA: Diagnosis not present

## 2022-04-25 DIAGNOSIS — R6889 Other general symptoms and signs: Secondary | ICD-10-CM | POA: Diagnosis not present

## 2022-04-25 DIAGNOSIS — E034 Atrophy of thyroid (acquired): Secondary | ICD-10-CM

## 2022-04-25 DIAGNOSIS — Z6841 Body Mass Index (BMI) 40.0 and over, adult: Secondary | ICD-10-CM | POA: Diagnosis not present

## 2022-04-25 MED ORDER — DILTIAZEM HCL ER COATED BEADS 240 MG PO CP24: 240.0000 mg | ORAL_CAPSULE | Freq: Every day | ORAL | 3 refills | Status: DC

## 2022-04-25 MED ORDER — TELMISARTAN 80 MG PO TABS: 80.0000 mg | ORAL_TABLET | Freq: Every day | ORAL | 1 refills | Status: DC

## 2022-04-25 NOTE — Addendum Note (Signed)
Addended by: Fidel Levy on: 04/25/2022 10:09 AM   Modules accepted: Orders

## 2022-04-25 NOTE — Telephone Encounter (Signed)
Lab Orders         Alpha-Gal Panel         VITAMIN D 25 Hydroxy (Vit-D Deficiency, Fractures)         CMP14+EGFR         Lipid panel         Bayer DCA Hb A1c Waived         CBC         TSH         T4, free

## 2022-04-25 NOTE — Addendum Note (Signed)
Addended by: Raiford Simmonds on: 04/25/2022 09:19 AM   Modules accepted: Orders

## 2022-04-27 ENCOUNTER — Encounter: Payer: Self-pay | Admitting: Family Medicine

## 2022-04-27 ENCOUNTER — Encounter: Payer: Self-pay | Admitting: Internal Medicine

## 2022-04-27 LAB — ALPHA-GAL PANEL
Allergen Lamb IgE: 9.53 kU/L — AB
Beef IgE: 9.56 kU/L — AB
IgE (Immunoglobulin E), Serum: 297 IU/mL (ref 6–495)
O215-IgE Alpha-Gal: 17 kU/L — AB
Pork IgE: 6.52 kU/L — AB

## 2022-05-17 ENCOUNTER — Telehealth: Payer: Medicare PPO | Admitting: Family Medicine

## 2022-05-17 DIAGNOSIS — J0100 Acute maxillary sinusitis, unspecified: Secondary | ICD-10-CM

## 2022-05-17 DIAGNOSIS — J40 Bronchitis, not specified as acute or chronic: Secondary | ICD-10-CM | POA: Diagnosis not present

## 2022-05-17 MED ORDER — PREDNISONE 20 MG PO TABS: 40.0000 mg | ORAL_TABLET | Freq: Every day | ORAL | 0 refills | Status: AC

## 2022-05-17 MED ORDER — AMOXICILLIN-POT CLAVULANATE 875-125 MG PO TABS: 1.0000 | ORAL_TABLET | Freq: Two times a day (BID) | ORAL | 0 refills | Status: AC

## 2022-05-17 NOTE — Progress Notes (Signed)
   Virtual Visit via video Note   Due to COVID-19 pandemic this visit was conducted virtually. This visit type was conducted due to national recommendations for restrictions regarding the COVID-19 Pandemic (e.g. social distancing, sheltering in place) in an effort to limit this patient's exposure and mitigate transmission in our community. All issues noted in this document were discussed and addressed.  A physical exam was not performed with this format.  I connected with  Rebekah [name]  on 05/17/22 at 1324 by video and verified that I am speaking with the correct person using two identifiers. Rebekah [name] is currently located at home and no one is currently with her during the visit. The provider, Gwenlyn Perking, FNP is located in their office at time of visit.  I discussed the limitations, risks, security and privacy concerns of performing an evaluation and management service by video  and the availability of in person appointments. I also discussed with the patient that there may be a patient responsible charge related to this service. The patient expressed understanding and agreed to proceed.  CC: sinusitis  History and Present Illness: Rebekah [name] reports congestion for the last week. This seemed to get better but then worsened yesterday. She now has increased sinus pressure with maxillary tenderness and HA. She also reports cough that now sounds croupy. She denies fever, chest pain, shortness of breath, or wheezing. She has a history of bronchitis. She has been taking tylenol and cloricidin with mild improvement.    ROS As per HPI.   Observations/Objective: Alert and oriented x 3. Non toxic appearing. Respirations unlabored. Able to speak in full sentences without difficulty. Normal mood and behavior.    Assessment and Plan: [name] was seen today for sinusitis.  Diagnoses and all orders for this visit:  Acute non-recurrent maxillary sinusitis Augmentin as below. Discussed symptomatic care  and return precautions.  -     amoxicillin-clavulanate (AUGMENTIN) 875-125 MG tablet; Take 1 tablet by mouth 2 (two) times daily for 7 days.  Bronchitis Prednisone as below.  -     predniSONE (DELTASONE) 20 MG tablet; Take 2 tablets (40 mg total) by mouth daily with breakfast for 5 days.   Follow Up Instructions: Return to office for new or worsening symptoms, or if symptoms persist.     I discussed the assessment and treatment plan with the patient. The patient was provided an opportunity to ask questions and all were answered. The patient agreed with the plan and demonstrated an understanding of the instructions.   The patient was advised to call back or seek an in-person evaluation if the symptoms worsen or if the condition fails to improve as anticipated.  The above assessment and management plan was discussed with the patient. The patient verbalized understanding of and has agreed to the management plan. Patient is aware to call the clinic if symptoms persist or worsen. Patient is aware when to return to the clinic for a follow-up visit. Patient educated on when it is appropriate to go to the emergency department.   Time call ended: 1330  I provided 6 minutes of face-to-face time during this encounter.    Gwenlyn Perking, FNP

## 2022-05-18 ENCOUNTER — Encounter: Payer: Self-pay | Admitting: Family Medicine

## 2022-05-18 ENCOUNTER — Ambulatory Visit: Payer: Medicare PPO | Admitting: Family Medicine

## 2022-05-28 ENCOUNTER — Encounter: Payer: Self-pay | Admitting: Family Medicine

## 2022-05-28 ENCOUNTER — Other Ambulatory Visit: Payer: Self-pay | Admitting: Family Medicine

## 2022-05-28 DIAGNOSIS — B3731 Acute candidiasis of vulva and vagina: Secondary | ICD-10-CM

## 2022-05-28 DIAGNOSIS — E034 Atrophy of thyroid (acquired): Secondary | ICD-10-CM

## 2022-05-28 MED ORDER — FLUCONAZOLE 150 MG PO TABS: 150.0000 mg | ORAL_TABLET | Freq: Once | ORAL | 0 refills | Status: AC

## 2022-05-29 ENCOUNTER — Ambulatory Visit: Payer: Medicare PPO | Admitting: Family Medicine

## 2022-06-01 ENCOUNTER — Encounter: Payer: Self-pay | Admitting: Internal Medicine

## 2022-06-04 NOTE — Telephone Encounter (Signed)
Patient is returning call to schedule an appointment with the afib clinic.

## 2022-06-04 NOTE — Telephone Encounter (Signed)
Pt calling back for an update

## 2022-06-05 ENCOUNTER — Encounter (HOSPITAL_COMMUNITY): Payer: Self-pay | Admitting: Nurse Practitioner

## 2022-06-05 ENCOUNTER — Ambulatory Visit (HOSPITAL_COMMUNITY)
Admission: RE | Admit: 2022-06-05 | Discharge: 2022-06-05 | Disposition: A | Discharge status: Home / Self Care | Payer: Medicare PPO | Source: Ambulatory Visit | Attending: Nurse Practitioner | Admitting: Nurse Practitioner

## 2022-06-05 VITALS — BP 132/102 | HR 123 | Ht 61.0 in | Wt 263.4 lb

## 2022-06-05 DIAGNOSIS — Z9981 Dependence on supplemental oxygen: Secondary | ICD-10-CM | POA: Insufficient documentation

## 2022-06-05 DIAGNOSIS — I4510 Unspecified right bundle-branch block: Secondary | ICD-10-CM | POA: Diagnosis not present

## 2022-06-05 DIAGNOSIS — Z7901 Long term (current) use of anticoagulants: Secondary | ICD-10-CM | POA: Insufficient documentation

## 2022-06-05 DIAGNOSIS — Z7985 Long-term (current) use of injectable non-insulin antidiabetic drugs: Secondary | ICD-10-CM | POA: Diagnosis not present

## 2022-06-05 DIAGNOSIS — Z79899 Other long term (current) drug therapy: Secondary | ICD-10-CM | POA: Diagnosis not present

## 2022-06-05 DIAGNOSIS — D6869 Other thrombophilia: Secondary | ICD-10-CM | POA: Diagnosis not present

## 2022-06-05 DIAGNOSIS — I10 Essential (primary) hypertension: Secondary | ICD-10-CM | POA: Insufficient documentation

## 2022-06-05 DIAGNOSIS — I4819 Other persistent atrial fibrillation: Secondary | ICD-10-CM | POA: Diagnosis not present

## 2022-06-05 DIAGNOSIS — I4891 Unspecified atrial fibrillation: Secondary | ICD-10-CM | POA: Diagnosis not present

## 2022-06-05 MED ORDER — DILTIAZEM HCL ER COATED BEADS 120 MG PO CP24: 120.0000 mg | ORAL_CAPSULE | Freq: Every day | ORAL | 1 refills | Status: DC

## 2022-06-05 NOTE — Progress Notes (Signed)
Patient ID: Rebekah [name], female   DOB: 1956-01-06, 67 y.o.   MRN: [id number]     Primary Care Physician: Janora Norlander, DO Referring Physician: Dr. Loletha Grayer Cardiologist: Dr. Burnard Hawthorne is a 67 y.o. female with a h/o PAF, that is in the afib clinic for ongoing afib since last week. She  is s/p bariatric surgery 9/15 . She used  PIP flecainide for breakthrough afib, very infrequently, but went to daily flecainide 50 mg bid per Dr Debara Pickett   when she had Covid last September with return of afib.Marland Kitchen She was hospitalized  for afib but self converted before  she could  be cardioverted.   She then later had another episode that lasted 3 days but again self converted. This current episode has been ongoing since last week. She has been taking 60 mg cardizem q 6 hours for rate control and v rates have been less than 100 bpm at home, although with RVR here.   She continues on eliquis 5 mg  with a chadsvasc score of 3. She has a RBBB and will not increase flecainide to 100 mg daily.    Today, she denies symptoms of palpitations, chest pain, shortness of breath, orthopnea, PND, lower extremity edema, dizziness, presyncope, syncope, or neurologic sequela. The patient is tolerating medications without difficulties and is otherwise without complaint today.   Past Medical History:  Diagnosis Date   Allergy    Multiple   Anxiety    Arthritis    Bilateral bunions    Cataract    Both eyes- lenses replaced   Chronic kidney disease 2022   Not kidney disease but kidney stone   Complication of anesthesia    Depression    More anxiety-worry-stress   Difficulty sleeping    prior sleep study did not reveal sleep apnea per patient   DJD (degenerative joint disease)    Dyspnea    Dysrhythmia    a-fib   Environmental allergies    Food allergy    Gallbladder problem    GERD (gastroesophageal reflux disease)    Heart murmur    History of kidney stones    Hypertension    no meds after weight loss    Hypothyroidism    Incontinence of urine    at nite    Joint pain    Left atrial dilatation    Left foot pain    Leg edema    Obesity    s/p gastric sleeve 12/2013 (previously weighed close to 400 lbs)   Osteoarthritis    Oxygen deficiency    3L/min for sleep due to slowed reaperations during sleep   Palpitations    Paroxysmal atrial fibrillation (HCC)    Pneumonia    hx of    PONV (postoperative nausea and vomiting)    Status post bilateral knee replacements    Supplemental oxygen dependent    uses 2l/Index at night, states HR goes low and O2 drops   Tuberculosis    had 6 month of INH due to exposure    Vaginal vault prolapse    Past Surgical History:  Procedure Laterality Date   APPENDECTOMY     CHOLECYSTECTOMY     CYSTOSCOPY/URETEROSCOPY/HOLMIUM LASER/STENT PLACEMENT Left 09/29/2020   Procedure: CYSTOSCOPY WITH LEFT RETROGRADE PYELOGRAM AND LEFT URETEROSCOPY/HOLMIUM LASER STONE REMOVAL LEFT /STENT PLACEMENT;  Surgeon: Ardis Hughs, MD;  Location: [facility];  Service: Urology;  Laterality: Left;   EYE SURGERY     03/2014  lens implant left lens    FOOT ARTHRODESIS Left 03/15/2016   Procedure: TALONAVICULAR AND SUBTALAR ARTHRODESIS;  Surgeon: Wylene Simmer, MD;  Location: [facility];  Service: Orthopedics;  Laterality: Left;   GASTROC RECESSION EXTREMITY Left 03/15/2016   Procedure: LEFT GASTROC RECESSION;  Surgeon: Wylene Simmer, MD;  Location: [facility];  Service: Orthopedics;  Laterality: Left;   JOINT REPLACEMENT  04/16/1997   L TOTAL KNEE   LAPAROSCOPIC GASTRIC SLEEVE RESECTION N/A 01/04/2014   Procedure: LAPAROSCOPIC GASTRIC SLEEVE RESECTION;  Surgeon: Greer Pickerel, MD;  Location: [facility];  Service: General;  Laterality: N/A;   TONSILLECTOMY     TOTAL KNEE ARTHROPLASTY Right 05/17/2015   Procedure: RIGHT TOTAL KNEE ARTHROPLASTY;  Surgeon: Paralee Cancel, MD;  Location: [facility];  Service: Orthopedics;  Laterality: Right;   TRANSTHORACIC  ECHOCARDIOGRAM  11/14/2012   EF 60-65%, mod LVH & mod conc hypertrophy, grade 1 diastolic dysfunction; LA mildly dilated; RV systolic pressure increased; PA peak pressure 18mHg   TUBAL LIGATION     UPPER GI ENDOSCOPY  01/04/2014   Procedure: UPPER GI ENDOSCOPY;  Surgeon: EGreer Pickerel MD;  Location: [facility];  Service: General;;    Current Outpatient Medications  Medication Sig Dispense Refill   acetaminophen (TYLENOL) 500 MG tablet Take 500-1,000 mg by mouth every 6 (six) hours as needed for moderate pain or headache.     apixaban (ELIQUIS) 5 MG TABS tablet Take 1 tablet (5 mg total) by mouth 2 (two) times daily. 180 tablet 3   busPIRone (BUSPAR) 10 MG tablet Take 1 tablet (10 mg total) by mouth 2 (two) times daily. 180 tablet 0   cetirizine (ZYRTEC) 10 MG tablet Take 10 mg by mouth daily as needed for allergies.     Cholecalciferol (VITAMIN D) 50 MCG (2000 UT) CAPS Take 2,000 Units by mouth daily.     diltiazem (CARDIZEM CD) 120 MG 24 hr capsule Take 1 capsule (120 mg total) by mouth at bedtime. 30 capsule 1   diltiazem (CARDIZEM CD) 240 MG 24 hr capsule Take 1 capsule (240 mg total) by mouth daily. 90 capsule 3   diltiazem (CARDIZEM) 30 MG tablet TAKE ONE TO TWO TABLETS BY MOUTH EVERY 6 HOURS AS NEEDED FOR  FAST  HEART  RATES 30 tablet 3   EPINEPHRINE 0.3 mg/0.3 mL IJ SOAJ injection inject 0.336m into the muscle once as needed for anaphylaxis 2 each 3   flecainide (TAMBOCOR) 50 MG tablet Take 1 tablet (50 mg total) by mouth 2 (two) times daily. 180 tablet 3   furosemide (LASIX) 20 MG tablet TAKE 1 TABLET BY MOUTH ONCE DAILY (Patient taking differently: Take 20 mg by mouth daily. Taking 3-5 times weekly) 90 tablet 3   hydroxychloroquine (PLAQUENIL) 200 MG tablet Take 200 mg by mouth 2 (two) times daily.     levothyroxine (SYNTHROID) 50 MCG tablet TAKE ONE TABLET BY MOUTH DAILY BEFORE BREAKFAST 90 tablet 1   Multiple Vitamins-Minerals (MULTIVITAMIN ADULT PO) Take 1 capsule by mouth daily.      OXYGEN Inhale 3 L/min into the lungs at bedtime. Continuously     Semaglutide-Weight Management (WEGOVY) 2.4 MG/0.75ML SOAJ Inject 2.4 mg into the skin once a week. Patient requests 3 month supply 9 mL 3   telmisartan (MICARDIS) 80 MG tablet Take 1 tablet (80 mg total) by mouth daily. 90 tablet 1   Vibegron 75 MG TABS Take 75 mg by mouth. 1 tablet Daily     No current facility-administered  medications for this encounter.    Allergies  Allergen Reactions   Beef-Derived Products Anaphylaxis    After tick bite, cannot eat beef, pork or lamb   Lambs Quarters Anaphylaxis    After tick bite cannot eat beef, pork or lamb   Mucinex [Guaifenesin Er] Anaphylaxis   Pork-Derived Products Anaphylaxis    After tick bite cannot eat beef, pork or lamb   Darvon [Propoxyphene] Other (See Comments)    Hallucinations    Methadone Other (See Comments) and Rash    Hallucinations  Hallucinations   Ace Inhibitors Swelling and Cough    Pedal Edema   Hydrocodone Itching   Ppd [Tuberculin Purified Protein Derivative]     Always has positive testing to PPD, do not use   Hydromet [Hydrocodone Bit-Homatrop Mbr] Itching and Other (See Comments)    Severe stomach pain-face itches.    Sulfonamide Derivatives Rash    Social History   Socioeconomic History   Marital status: Married    Spouse name: Lattie Spannagel   Number of children: 2   Years of education: 16   Highest education level: Bachelor's degree (e.g., BA, AB, BS)  Occupational History   Occupation: special Public relations account executive: OTHER    Comment: Diplomatic Services operational officer. Schools   Occupation: retired Marine scientist - L&D @ [employer]  Tobacco Use   Smoking status: Never    Passive exposure: Never   Smokeless tobacco: Never   Tobacco comments:    Tried 1-2 cigarettes as a child. Nothing else.  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   Social Determinants of Health   Financial Resource Strain: Low Risk  (11/20/2021)   Overall Financial Resource Strain (CARDIA)    Difficulty of Paying Living Expenses: Not hard at all  Food Insecurity: No Food Insecurity (11/20/2021)   Hunger Vital Sign    Worried About Running Out of Food in the Last Year: Never true    [place] in the Last Year: Never true  Transportation Needs: No Transportation Needs (11/20/2021)   PRAPARE - Hydrologist (Medical): No    Lack of Transportation (Non-Medical): No  Physical Activity: Inactive (11/20/2021)   Exercise Vital Sign    Days of Exercise per Week: 0 days    Minutes of Exercise per Session: 0 min  Stress: No Stress Concern Present (11/20/2021)   [place]    Feeling of Stress : Only a little  Social Connections: Socially Integrated (11/20/2021)   Social Connection and Isolation Panel [NHANES]    Frequency of Communication with Friends and Family: More than three times a week    Frequency of Social Gatherings with Friends and Family: More than three times a week    Attends Religious Services: More than 4 times per year    Active Member of [employer] or Organizations: Yes    Attends Archivist Meetings: More than 4 times per year    Marital Status: Married  Human resources officer Violence: Not At Risk (11/20/2021)  Humiliation, Afraid, Rape, and Kick questionnaire    Fear of Current or Ex-Partner: No    Emotionally Abused: No    Physically Abused: No    Sexually Abused: No    Family History  Problem Relation Age of Onset   Diabetes Father    Hyperlipidemia Father    Hypertension Father    Heart disease Father     Sudden death Father    Anxiety disorder Father    Obesity Father    Varicose Veins Father    Uterine cancer Mother    Cervical cancer Mother    Breast cancer Mother    Cancer Mother        cervical and brreast cancer   Hypertension Mother    Hyperlipidemia Mother    Obesity Mother    Arthritis Mother    Diabetes Maternal Grandmother    Obesity Maternal Grandmother    Diabetes Brother    Hypertension Brother    Stroke Brother    Hypertension Brother    Obesity Daughter     ROS- All systems are reviewed and negative except as per the HPI above  Physical Exam: Vitals:   06/05/22 1536  BP: (!) 132/102  Pulse: (!) 123  Weight: 119.5 kg  Height: 5' 1"$  (1.549 m)    GEN- The patient is well appearing, alert and oriented x 3 today.   Head- normocephalic, atraumatic Eyes-  Sclera clear, conjunctiva pink Ears- hearing intact Oropharynx- clear Neck- supple, no JVP Lymph- no cervical lymphadenopathy Lungs- Clear to ausculation bilaterally, normal work of breathing Heart- irregular rate and rhythm, no murmurs, rubs or gallops, PMI not laterally displaced GI- soft, NT, ND, + BS Extremities- no clubbing, cyanosis, or edema MS- no significant deformity or atrophy Skin- no rash or lesion Psych- euthymic mood, full affect Neuro- strength and sensation are intact  EKG- 123 BPM PR interval * ms QRS duration 144 ms QT/QTcB 374/535 ms P-R-T axes * 14 -14 Atrial fibrillation with rapid ventricular response Right bundle branch block Abnormal ECG When compared with ECG of 27-Dec-2021 09:00, PREVIOUS ECG IS PRESENT  Assessment and Plan: 1. PAF  Treated afib with PIP flecainide in the past but changed to daily flecainide 50 mg bid at a time of covid. Has had persistent afib since last week   Will increase 240 mg cardizem in am to an additional 120 mg in pm, instead of 60 mg q 6 hours.  Continue flecainide 50 mg bid but will not increase for presence of BBB I will set up for  cardioversion but will  refer to EP after ward  to be considered  for an ablation vrs change in AAT Go back to 240 mg cardizem  day before cardioversion  Cbc/bmet  2. CHA2DS2VASc  score of 3 States no missed doses of eliquis 5 mg bid for at least 3 weeks   3. HTN stable  Return to afib clinic one week after cardioversion    Butch Penny C. Hulan Szumski, [facility] [address] [place],  [address] [phone]

## 2022-06-05 NOTE — Patient Instructions (Signed)
Cardioversion scheduled for: Monday, March 11th   - Arrive at the [facility] and go to admitting at [facility] not eat or drink anything after midnight the night prior to your procedure.   - Take all your morning medication (except diabetic medications) with a sip of water prior to arrival.  - You will not be able to drive home after your procedure.    - Do NOT miss any doses of your blood thinner - if you should miss a dose please notify our office immediately.   - If you feel as if you go back into normal rhythm prior to scheduled cardioversion, please notify our office immediately.   If your procedure is canceled in the cardioversion suite you will be charged a cancellation fee.    Hold medication 7 days prior to scheduled procedure/anesthesia.  Restart medication on the normal dosing day after scheduled procedure/anesthesia  Dulaglutide (Trulicity) Exenatide extended release (Bydureon bcise) Semaglutide (Ozempic) (WEGOVY)  Tirzepatide (Mounjaro)     Hold medication 24 hours prior to scheduled procedure/anesthesia.   Restart medication on the following day after scheduled procedure/anesthesia   Exenatide (Byetta)  Liraglutide (Victoza, Saxenda)  Lixisenatide (Adlyxin)  Semaglutide (Rybelsus) Polyethylene Glycol Loxenatide   For those patients who have a scheduled procedure/anesthesia on the same day of the week as their dose, hold the medication on the day of surgery.  They can take their scheduled dose the week before.  **Patients on the above medications scheduled for elective procedures that have not held the medication for the appropriate amount of time are at risk of cancellation or change in the anesthetic plan.

## 2022-06-06 ENCOUNTER — Encounter (HOSPITAL_COMMUNITY): Payer: Self-pay

## 2022-06-11 ENCOUNTER — Encounter (HOSPITAL_COMMUNITY): Payer: Self-pay

## 2022-06-11 ENCOUNTER — Telehealth (HOSPITAL_COMMUNITY): Payer: Self-pay | Admitting: *Deleted

## 2022-06-11 NOTE — Telephone Encounter (Signed)
Cardioversion canceled as pt back in NSR over weekend. HRs in the 70s per Adline Peals PA will continue cardizem '240mg'$  AM and '120mg'$  PM keep scheduled follow up with Dr Curt Bears in March. Pt in agreement.

## 2022-06-12 ENCOUNTER — Other Ambulatory Visit: Payer: Self-pay | Admitting: Internal Medicine

## 2022-06-20 ENCOUNTER — Encounter: Payer: Self-pay | Admitting: Family Medicine

## 2022-06-25 ENCOUNTER — Ambulatory Visit (HOSPITAL_COMMUNITY): Admit: 2022-06-25 | Payer: Medicare PPO | Admitting: Internal Medicine

## 2022-06-25 ENCOUNTER — Encounter (HOSPITAL_COMMUNITY): Payer: Self-pay

## 2022-06-25 SURGERY — CARDIOVERSION
Anesthesia: Monitor Anesthesia Care

## 2022-06-26 ENCOUNTER — Other Ambulatory Visit: Payer: Self-pay | Admitting: Internal Medicine

## 2022-06-29 ENCOUNTER — Other Ambulatory Visit: Payer: Medicare PPO

## 2022-06-29 DIAGNOSIS — I480 Paroxysmal atrial fibrillation: Secondary | ICD-10-CM | POA: Diagnosis not present

## 2022-06-29 DIAGNOSIS — Z8619 Personal history of other infectious and parasitic diseases: Secondary | ICD-10-CM

## 2022-06-29 DIAGNOSIS — E559 Vitamin D deficiency, unspecified: Secondary | ICD-10-CM

## 2022-06-29 DIAGNOSIS — Z6841 Body Mass Index (BMI) 40.0 and over, adult: Secondary | ICD-10-CM | POA: Diagnosis not present

## 2022-06-29 DIAGNOSIS — E034 Atrophy of thyroid (acquired): Secondary | ICD-10-CM | POA: Diagnosis not present

## 2022-06-29 LAB — BAYER DCA HB A1C WAIVED: HB A1C (BAYER DCA - WAIVED): 5.4 % (ref 4.8–5.6)

## 2022-06-29 LAB — LIPID PANEL

## 2022-06-30 LAB — CBC
Hematocrit: 42.5 % (ref 34.0–46.6)
Hemoglobin: 13.5 g/dL (ref 11.1–15.9)
MCH: 28.1 pg (ref 26.6–33.0)
MCHC: 31.8 g/dL (ref 31.5–35.7)
MCV: 88 fL (ref 79–97)
Platelets: 184 10*3/uL (ref 150–450)
RBC: 4.81 x10E6/uL (ref 3.77–5.28)
RDW: 14.5 % (ref 11.7–15.4)
WBC: 5.8 10*3/uL (ref 3.4–10.8)

## 2022-06-30 LAB — CMP14+EGFR
ALT: 19 IU/L (ref 0–32)
AST: 18 IU/L (ref 0–40)
Albumin/Globulin Ratio: 1.6 (ref 1.2–2.2)
Albumin: 4.4 g/dL (ref 3.9–4.9)
Alkaline Phosphatase: 78 IU/L (ref 44–121)
BUN/Creatinine Ratio: 20 (ref 12–28)
BUN: 18 mg/dL (ref 8–27)
Bilirubin Total: 0.4 mg/dL (ref 0.0–1.2)
CO2: 27 mmol/L (ref 20–29)
Calcium: 10 mg/dL (ref 8.7–10.3)
Chloride: 101 mmol/L (ref 96–106)
Creatinine, Ser: 0.92 mg/dL (ref 0.57–1.00)
Globulin, Total: 2.7 g/dL (ref 1.5–4.5)
Glucose: 94 mg/dL (ref 70–99)
Potassium: 4.7 mmol/L (ref 3.5–5.2)
Sodium: 141 mmol/L (ref 134–144)
Total Protein: 7.1 g/dL (ref 6.0–8.5)
eGFR: 68 mL/min/{1.73_m2} (ref >59)

## 2022-06-30 LAB — LIPID PANEL
Chol/HDL Ratio: 2.5 ratio (ref 0.0–4.4)
Cholesterol, Total: 150 mg/dL (ref 100–199)
HDL: 60 mg/dL (ref >39)
LDL Chol Calc (NIH): 66 mg/dL (ref 0–99)
Triglycerides: 143 mg/dL (ref 0–149)
VLDL Cholesterol Cal: 24 mg/dL (ref 5–40)

## 2022-06-30 LAB — TSH: TSH: 3.37 u[IU]/mL (ref 0.450–4.500)

## 2022-06-30 LAB — T4, FREE: Free T4: 1.12 ng/dL (ref 0.82–1.77)

## 2022-06-30 LAB — VITAMIN D 25 HYDROXY (VIT D DEFICIENCY, FRACTURES): Vit D, 25-Hydroxy: 36.1 ng/mL (ref 30.0–100.0)

## 2022-07-02 ENCOUNTER — Ambulatory Visit (HOSPITAL_COMMUNITY): Payer: Medicare PPO | Admitting: Nurse Practitioner

## 2022-07-02 ENCOUNTER — Other Ambulatory Visit: Payer: Self-pay | Admitting: Family Medicine

## 2022-07-02 ENCOUNTER — Other Ambulatory Visit (HOSPITAL_COMMUNITY): Payer: Self-pay | Admitting: *Deleted

## 2022-07-02 DIAGNOSIS — F419 Anxiety disorder, unspecified: Secondary | ICD-10-CM

## 2022-07-02 MED ORDER — DILTIAZEM HCL ER COATED BEADS 120 MG PO CP24: 120.0000 mg | ORAL_CAPSULE | Freq: Every day | ORAL | 5 refills | Status: DC

## 2022-07-04 ENCOUNTER — Ambulatory Visit: Payer: Medicare PPO | Admitting: Family Medicine

## 2022-07-04 ENCOUNTER — Encounter: Payer: Self-pay | Admitting: Family Medicine

## 2022-07-04 VITALS — BP 120/78 | HR 91 | Temp 98.8°F | Ht 61.0 in | Wt 260.0 lb

## 2022-07-04 DIAGNOSIS — R5382 Chronic fatigue, unspecified: Secondary | ICD-10-CM

## 2022-07-04 DIAGNOSIS — E034 Atrophy of thyroid (acquired): Secondary | ICD-10-CM

## 2022-07-04 DIAGNOSIS — I480 Paroxysmal atrial fibrillation: Secondary | ICD-10-CM | POA: Diagnosis not present

## 2022-07-04 DIAGNOSIS — Z6841 Body Mass Index (BMI) 40.0 and over, adult: Secondary | ICD-10-CM

## 2022-07-04 NOTE — Progress Notes (Signed)
Subjective: CC: Hypothyroidism, morbid obesity PCP: Janora Norlander, DO [id number] [address] is a 67 y.o. female presenting to clinic today for:  1.  Hypothyroidism with morbid obesity in the setting of previous gastric surgery Patient is compliant with her medications.  She voices frustration over ongoing difficulty losing weight.  She discontinued semaglutide because it essentially became ineffective for her.  She is going to try the carnivore diet.  She has a list of labs that she would like to try and get drawn if possible.   ROS: Per HPI  Allergies  Allergen Reactions   Beef-Derived Products Anaphylaxis    After tick bite, cannot eat beef, pork or lamb   Lambs Quarters Anaphylaxis    After tick bite cannot eat beef, pork or lamb   Mucinex [Guaifenesin Er] Anaphylaxis   Pork-Derived Products Anaphylaxis    After tick bite cannot eat beef, pork or lamb   Darvon [Propoxyphene] Other (See Comments)    Hallucinations    Methadone Other (See Comments) and Rash    Hallucinations  Hallucinations   Ace Inhibitors Swelling and Cough    Pedal Edema   Hydrocodone Itching   Ppd [Tuberculin Purified Protein Derivative]     Always has positive testing to PPD, do not use   Hydromet [Hydrocodone Bit-Homatrop Mbr] Itching and Other (See Comments)    Severe stomach pain-face itches.    Sulfonamide Derivatives Rash   Past Medical History:  Diagnosis Date   Allergy    Multiple   Anxiety    Arthritis    Atrial fibrillation with rapid ventricular response ([place]) 12/26/2021   Atrial fibrillation with RVR ([place]) 12/27/2021   Bilateral bunions    Cataract    Both eyes- lenses replaced   Chronic kidney disease 2022   Not kidney disease but kidney stone   Complication of anesthesia    Depression    More anxiety-worry-stress   Difficulty sleeping    prior sleep study did not reveal sleep apnea per patient   DJD (degenerative joint disease)    Dyspnea    Dysrhythmia    a-fib    Environmental allergies    Food allergy    Gallbladder problem    GERD (gastroesophageal reflux disease)    Heart murmur    History of kidney stones    Hypertension    no meds after weight loss   Hypothyroidism    Incontinence of urine    at nite    Joint pain    Left atrial dilatation    Left foot pain    Leg edema    Obesity    s/p gastric sleeve 12/2013 (previously weighed close to 400 lbs)   Osteoarthritis    Oxygen deficiency    3L/min for sleep due to slowed reaperations during sleep   Palpitations    Paroxysmal atrial fibrillation (HCC)    Pneumonia    hx of    PONV (postoperative nausea and vomiting)    Status post bilateral knee replacements    Supplemental oxygen dependent    uses 2l/ at night, states HR goes low and O2 drops   Tuberculosis    had 6 month of INH due to exposure    Vaginal vault prolapse     Current Outpatient Medications:    acetaminophen (TYLENOL) 500 MG tablet, Take 500-1,000 mg by mouth every 6 (six) hours as needed for moderate pain or headache., Disp: , Rfl:    apixaban (ELIQUIS) 5 MG TABS tablet,  Take 1 tablet (5 mg total) by mouth 2 (two) times daily., Disp: 180 tablet, Rfl: 3   busPIRone (BUSPAR) 10 MG tablet, Take 1 tablet (10 mg total) by mouth 2 (two) times daily., Disp: 180 tablet, Rfl: 0   cetirizine (ZYRTEC) 10 MG tablet, Take 10 mg by mouth daily as needed for allergies., Disp: , Rfl:    Cholecalciferol (VITAMIN D) 50 MCG (2000 UT) CAPS, Take 2,000 Units by mouth daily., Disp: , Rfl:    diltiazem (CARDIZEM CD) 120 MG 24 hr capsule, Take 1 capsule (120 mg total) by mouth at bedtime., Disp: 30 capsule, Rfl: 5   diltiazem (CARDIZEM CD) 240 MG 24 hr capsule, Take 1 capsule (240 mg total) by mouth daily., Disp: 90 capsule, Rfl: 3   diltiazem (CARDIZEM) 30 MG tablet, TAKE ONE TO TWO TABLETS BY MOUTH EVERY 6 HOURS AS NEEDED FOR FAST HEART RATES, Disp: 30 tablet, Rfl: 9   EPINEPHRINE 0.3 mg/0.3 mL IJ SOAJ injection, inject 0.9mls into the  muscle once as needed for anaphylaxis, Disp: 2 each, Rfl: 3   flecainide (TAMBOCOR) 50 MG tablet, Take 1 tablet (50 mg total) by mouth 2 (two) times daily., Disp: 180 tablet, Rfl: 3   furosemide (LASIX) 20 MG tablet, TAKE 1 TABLET BY MOUTH ONCE DAILY (Patient taking differently: Take 20 mg by mouth daily. Taking 3-5 times weekly), Disp: 90 tablet, Rfl: 3   hydroxychloroquine (PLAQUENIL) 200 MG tablet, Take 200 mg by mouth 2 (two) times daily., Disp: , Rfl:    levothyroxine (SYNTHROID) 50 MCG tablet, TAKE ONE TABLET BY MOUTH DAILY BEFORE BREAKFAST, Disp: 90 tablet, Rfl: 1   Multiple Vitamins-Minerals (MULTIVITAMIN ADULT PO), Take 1 capsule by mouth daily., Disp: , Rfl:    OXYGEN, Inhale 3 L/min into the lungs at bedtime. Continuously, Disp: , Rfl:    telmisartan (MICARDIS) 80 MG tablet, Take 1 tablet (80 mg total) by mouth daily., Disp: 90 tablet, Rfl: 1   Vibegron 75 MG TABS, Take 75 mg by mouth. 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 Nothing else.  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   Social Determinants of Health   Financial  Resource Strain: Low Risk  (11/20/2021)   Overall Financial Resource Strain (CARDIA)    Difficulty of Paying Living Expenses: Not hard at all  Food Insecurity: No Food Insecurity (11/20/2021)   Hunger Vital Sign    Worried About Running Out of Food in the Last Year: Never true  Ran Out of Food in the Last Year: Never true  Transportation Needs: No Transportation Needs (11/20/2021)   PRAPARE - Hydrologist (Medical): No    Lack of Transportation (Non-Medical): No  Physical Activity: Inactive (11/20/2021)   Exercise Vital Sign    Days of Exercise per Week: 0 days    Minutes of Exercise per Session: 0 min  Stress: No Stress Concern Present (11/20/2021)   [place]    Feeling of Stress : Only a little  Social Connections: Socially Integrated (11/20/2021)   Social Connection and Isolation Panel [NHANES]    Frequency of Communication with Friends and Family: More than three times a week    Frequency of Social Gatherings with Friends and Family: More than three times a week    Attends Religious Services: More than 4 times per year    Active Member of [employer] or Organizations: Yes    Attends Music therapist: More than 4 times per year    Marital Status: Married  Human resources officer Violence: Not At Risk (11/20/2021)   Humiliation, Afraid, Rape, and Kick questionnaire    Fear of Current or Ex-Partner: No    Emotionally Abused: No    Physically Abused: No    Sexually Abused: No   Family History  Problem Relation Age of Onset   Diabetes Father    Hyperlipidemia Father    Hypertension Father    Heart disease Father    Sudden death Father    Anxiety disorder Father    Obesity Father    Varicose Veins Father    Uterine cancer Mother    Cervical cancer Mother    Breast cancer Mother    Cancer Mother        cervical and brreast cancer   Hypertension Mother    Hyperlipidemia Mother    Obesity  Mother    Arthritis Mother    Diabetes Maternal Grandmother    Obesity Maternal Grandmother    Diabetes Brother    Hypertension Brother    Stroke Brother    Hypertension Brother    Obesity Daughter     Objective: Office vital signs reviewed. BP 120/78   Pulse 91   Temp 98.8 F (37.1 C)   Ht 5\' 1"  (1.549 m)   Wt 260 lb (117.9 kg)   SpO2 95%   BMI 49.13 kg/m   Physical Examination:  General: Awake, alert, morbidly obese, No acute distress HEENT: No exophthalmos.  No goiter Cardio: regular rate and rhythm, S1S2 heard, no murmurs appreciated Pulm: clear to auscultation bilaterally, no wheezes, rhonchi or rales; normal work of breathing on room air    Assessment/ Plan: 67 y.o. female   Hypothyroidism due to acquired atrophy of thyroid - Plan: Thyroid Panel With TSH  Paroxysmal A-fib (HCC) - Plan: High sensitivity CRP, Iron, TIBC and Ferritin Panel  Class 3 severe obesity due to excess calories with serious comorbidity and body mass index (BMI) of 45.0 to 49.9 in adult [facility]) - Plan: Thyroid Panel With TSH, Insulin and C-Peptide, High sensitivity CRP, Vitamin B12, Iron, TIBC and Ferritin Panel, Testosterone  Chronic fatigue - Plan: Thyroid Panel With TSH, Insulin and C-Peptide, High sensitivity CRP, Vitamin B12, Iron, TIBC and Ferritin Panel, Testosterone  Future labs ordered per her request.  Discussed they may not be totally covered by insurance for what she has asked them for.  She was amenable to labs.  Ok to  try carnivore diet.  Plan for repeat Lipid, CMP in 3 months.    Orders Placed This Encounter  Procedures   Thyroid Panel With TSH    Standing Status:   Future    Standing Expiration Date:   07/04/2023   Insulin and C-Peptide    Standing Status:   Future    Standing Expiration Date:   07/04/2023   High sensitivity CRP    Standing Status:   Future    Standing Expiration Date:   07/04/2023   Vitamin B12    Standing Status:   Future    Standing Expiration Date:    07/04/2023   Iron, TIBC and Ferritin Panel    Standing Status:   Future    Standing Expiration Date:   07/04/2023   Testosterone    Standing Status:   Future    Standing Expiration Date:   07/04/2023   No orders of the defined types were placed in this encounter.    Janora Norlander, DO [place] [phone]

## 2022-07-05 ENCOUNTER — Encounter: Payer: Self-pay | Admitting: Internal Medicine

## 2022-07-05 ENCOUNTER — Other Ambulatory Visit: Payer: Self-pay

## 2022-07-05 MED ORDER — FLECAINIDE ACETATE 50 MG PO TABS: 50.0000 mg | ORAL_TABLET | Freq: Two times a day (BID) | ORAL | 3 refills | Status: DC

## 2022-07-06 ENCOUNTER — Other Ambulatory Visit: Payer: Self-pay | Admitting: Family Medicine

## 2022-07-06 ENCOUNTER — Encounter: Payer: Self-pay | Admitting: Family Medicine

## 2022-07-06 ENCOUNTER — Other Ambulatory Visit: Payer: Medicare PPO

## 2022-07-06 DIAGNOSIS — E66813 Obesity, class 3: Secondary | ICD-10-CM

## 2022-07-06 DIAGNOSIS — R7989 Other specified abnormal findings of blood chemistry: Secondary | ICD-10-CM

## 2022-07-06 DIAGNOSIS — R7982 Elevated C-reactive protein (CRP): Secondary | ICD-10-CM

## 2022-07-06 DIAGNOSIS — I480 Paroxysmal atrial fibrillation: Secondary | ICD-10-CM | POA: Diagnosis not present

## 2022-07-06 DIAGNOSIS — Z6841 Body Mass Index (BMI) 40.0 and over, adult: Secondary | ICD-10-CM | POA: Diagnosis not present

## 2022-07-06 DIAGNOSIS — E034 Atrophy of thyroid (acquired): Secondary | ICD-10-CM | POA: Diagnosis not present

## 2022-07-06 DIAGNOSIS — R5382 Chronic fatigue, unspecified: Secondary | ICD-10-CM | POA: Diagnosis not present

## 2022-07-06 DIAGNOSIS — E559 Vitamin D deficiency, unspecified: Secondary | ICD-10-CM

## 2022-07-06 DIAGNOSIS — E7849 Other hyperlipidemia: Secondary | ICD-10-CM

## 2022-07-06 DIAGNOSIS — R947 Abnormal results of other endocrine function studies: Secondary | ICD-10-CM

## 2022-07-07 LAB — IRON,TIBC AND FERRITIN PANEL
Ferritin: 62 ng/mL (ref 15–150)
Iron Saturation: 15 % (ref 15–55)
Iron: 67 ug/dL (ref 27–139)
Total Iron Binding Capacity: 433 ug/dL (ref 250–450)
UIBC: 366 ug/dL (ref 118–369)

## 2022-07-07 LAB — HIGH SENSITIVITY CRP: CRP, High Sensitivity: 3.34 mg/L — ABNORMAL HIGH (ref 0.00–3.00)

## 2022-07-07 LAB — THYROID PANEL WITH TSH
Free Thyroxine Index: 2.1 (ref 1.2–4.9)
T3 Uptake Ratio: 25 % (ref 24–39)
T4, Total: 8.3 ug/dL (ref 4.5–12.0)
TSH: 2.85 u[IU]/mL (ref 0.450–4.500)

## 2022-07-07 LAB — TESTOSTERONE: Testosterone: <3 ng/dL — ABNORMAL LOW (ref 3–67)

## 2022-07-07 LAB — INSULIN AND C-PEPTIDE, SERUM
C-Peptide: 4.5 ng/mL — ABNORMAL HIGH (ref 1.1–4.4)
INSULIN: 20.4 u[IU]/mL (ref 2.6–24.9)

## 2022-07-07 LAB — VITAMIN B12: Vitamin B-12: 526 pg/mL (ref 232–1245)

## 2022-07-09 ENCOUNTER — Encounter: Payer: Self-pay | Admitting: Family Medicine

## 2022-07-10 ENCOUNTER — Encounter: Payer: Self-pay | Admitting: Cardiology

## 2022-07-10 ENCOUNTER — Ambulatory Visit: Payer: Medicare PPO | Attending: Cardiology | Admitting: Cardiology

## 2022-07-10 VITALS — BP 120/84 | HR 84 | Ht 61.0 in | Wt 262.3 lb

## 2022-07-10 DIAGNOSIS — I10 Essential (primary) hypertension: Secondary | ICD-10-CM

## 2022-07-10 DIAGNOSIS — I4819 Other persistent atrial fibrillation: Secondary | ICD-10-CM | POA: Diagnosis not present

## 2022-07-10 DIAGNOSIS — Z79899 Other long term (current) drug therapy: Secondary | ICD-10-CM | POA: Diagnosis not present

## 2022-07-10 DIAGNOSIS — D6869 Other thrombophilia: Secondary | ICD-10-CM

## 2022-07-10 MED ORDER — DILTIAZEM HCL ER COATED BEADS 120 MG PO CP24: 120.0000 mg | ORAL_CAPSULE | Freq: Every day | ORAL | 3 refills | Status: DC

## 2022-07-10 NOTE — Patient Instructions (Signed)
Medication Instructions:  Your physician recommends that you continue on your current medications as directed. Please refer to the Current Medication list given to you today. *If you need a refill on your cardiac medications before your next appointment, please call your pharmacy*   Follow-Up: At [facility], you and your health needs are our priority.  As part of our continuing mission to provide you with exceptional heart care, we have created designated Provider Care Teams.  These Care Teams include your primary Cardiologist (physician) and Advanced Practice Providers (APPs -  Physician Assistants and Nurse Practitioners) who all work together to provide you with the care you need, when you need it.   Your next appointment:   6 month(s)  Provider:   You will follow up in the [facility] located at [facility]. Your provider will be: Clint R. Fenton, PA-C

## 2022-07-10 NOTE — Progress Notes (Signed)
Electrophysiology Office Note   Date:  07/10/2022   ID:  Rebekah [name], DOB 1956-04-04, MRN [id number]  PCP:  Janora Norlander, DO  Cardiologist:  Debara Pickett Primary Electrophysiologist:  Undrea Archbold Meredith Leeds, MD    Chief Complaint: AF   History of Present Illness: Rebekah [name] is a 67 y.o. female who is being seen today for the evaluation of AF at the request of Sherran Needs, NP. Presenting today for electrophysiology evaluation.  She has a history significant for atrial fibrillation.  She had bariatric surgery in 2015.  She uses pill in the pocket flecainide for breakthrough atrial fibrillation, but is recently switched to daily flecainide.  She had COVID in September with return of atrial fibrillation.  She was hospitalized for A-fib and then converted before cardioversion.  She presented to atrial fibrillation clinic in atrial fibrillation.  She was initially set up for cardioversion.  Just prior to cardioversion, she converted back to sinus rhythm.  She has not had any further episodes of atrial fibrillation.  She is currently happy with her control and wishes to continue with her current dose of flecainide.  Today, she denies symptoms of palpitations, chest pain, shortness of breath, orthopnea, PND, lower extremity edema, claudication, dizziness, presyncope, syncope, bleeding, or neurologic sequela. The patient is tolerating medications without difficulties.    Past Medical History:  Diagnosis Date   Allergy    Multiple   Anxiety    Arthritis    Atrial fibrillation with rapid ventricular response ([place]) 12/26/2021   Atrial fibrillation with RVR ([place]) 12/27/2021   Bilateral bunions    Cataract    Both eyes- lenses replaced   Chronic kidney disease 2022   Not kidney disease but kidney stone   Complication of anesthesia    Depression    More anxiety-worry-stress   Difficulty sleeping    prior sleep study did not reveal sleep apnea per patient   DJD (degenerative joint  disease)    Dyspnea    Dysrhythmia    a-fib   Environmental allergies    Food allergy    Gallbladder problem    GERD (gastroesophageal reflux disease)    Heart murmur    History of kidney stones    Hypertension    no meds after weight loss   Hypothyroidism    Incontinence of urine    at nite    Joint pain    Left atrial dilatation    Left foot pain    Leg edema    Obesity    s/p gastric sleeve 12/2013 (previously weighed close to 400 lbs)   Osteoarthritis    Oxygen deficiency    3L/min for sleep due to slowed reaperations during sleep   Palpitations    Paroxysmal atrial fibrillation (HCC)    Pneumonia    hx of    PONV (postoperative nausea and vomiting)    Status post bilateral knee replacements    Supplemental oxygen dependent    uses 2l/[place] at night, states HR goes low and O2 drops   Tuberculosis    had 6 month of INH due to exposure    Vaginal vault prolapse    Past Surgical History:  Procedure Laterality Date   APPENDECTOMY     CHOLECYSTECTOMY     CYSTOSCOPY/URETEROSCOPY/HOLMIUM LASER/STENT PLACEMENT Left 09/29/2020   Procedure: CYSTOSCOPY WITH LEFT RETROGRADE PYELOGRAM AND LEFT URETEROSCOPY/HOLMIUM LASER STONE REMOVAL LEFT /STENT PLACEMENT;  Surgeon: Ardis Hughs, MD;  Location: [facility];  Service: Urology;  Laterality: Left;   EYE SURGERY     03/2014 lens implant left lens    FOOT ARTHRODESIS Left 03/15/2016   Procedure: TALONAVICULAR AND SUBTALAR ARTHRODESIS;  Surgeon: Wylene Simmer, MD;  Location: [place];  Service: Orthopedics;  Laterality: Left;   GASTROC RECESSION EXTREMITY Left 03/15/2016   Procedure: LEFT GASTROC RECESSION;  Surgeon: Wylene Simmer, MD;  Location: [place];  Service: Orthopedics;  Laterality: Left;   JOINT REPLACEMENT  04/16/1997   L TOTAL KNEE   LAPAROSCOPIC GASTRIC SLEEVE RESECTION N/A 01/04/2014   Procedure: LAPAROSCOPIC GASTRIC SLEEVE RESECTION;  Surgeon: Greer Pickerel, MD;  Location: [facility];  Service:  General;  Laterality: N/A;   TONSILLECTOMY     TOTAL KNEE ARTHROPLASTY Right 05/17/2015   Procedure: RIGHT TOTAL KNEE ARTHROPLASTY;  Surgeon: Paralee Cancel, MD;  Location: [facility];  Service: Orthopedics;  Laterality: Right;   TRANSTHORACIC ECHOCARDIOGRAM  11/14/2012   EF 60-65%, mod LVH & mod conc hypertrophy, grade 1 diastolic dysfunction; LA mildly dilated; RV systolic pressure increased; PA peak pressure 46mmHg   TUBAL LIGATION     UPPER GI ENDOSCOPY  01/04/2014   Procedure: UPPER GI ENDOSCOPY;  Surgeon: Greer Pickerel, MD;  Location: [facility];  Service: General;;     Current Outpatient Medications  Medication Sig Dispense Refill   acetaminophen (TYLENOL) 500 MG tablet Take 500-1,000 mg by mouth every 6 (six) hours as needed for moderate pain or headache.     apixaban (ELIQUIS) 5 MG TABS tablet Take 1 tablet (5 mg total) by mouth 2 (two) times daily. 180 tablet 3   busPIRone (BUSPAR) 10 MG tablet Take 1 tablet (10 mg total) by mouth 2 (two) times daily. 180 tablet 0   cetirizine (ZYRTEC) 10 MG tablet Take 10 mg by mouth daily as needed for allergies.     diltiazem (CARDIZEM CD) 240 MG 24 hr capsule Take 1 capsule (240 mg total) by mouth daily. 90 capsule 3   diltiazem (CARDIZEM) 30 MG tablet TAKE ONE TO TWO TABLETS BY MOUTH EVERY 6 HOURS AS NEEDED FOR FAST HEART RATES 30 tablet 9   EPINEPHRINE 0.3 mg/0.3 mL IJ SOAJ injection inject 0.24mls into the muscle once as needed for anaphylaxis 2 each 3   Ferrous Sulfate (IRON PO) Take 30 mg by mouth daily.     flecainide (TAMBOCOR) 50 MG tablet Take 1 tablet (50 mg total) by mouth 2 (two) times daily. 180 tablet 3   furosemide (LASIX) 20 MG tablet TAKE 1 TABLET BY MOUTH ONCE DAILY (Patient taking differently: Take 20 mg by mouth daily. Taking 3-5 times weekly) 90 tablet 3   hydroxychloroquine (PLAQUENIL) 200 MG tablet Take 200 mg by mouth 2 (two) times daily.     INOSITOL PO Take 1,000 mg by mouth in the morning and at bedtime.     levothyroxine  (SYNTHROID) 50 MCG tablet TAKE ONE TABLET BY MOUTH DAILY BEFORE BREAKFAST 90 tablet 1   OXYGEN Inhale 3 L/min into the lungs at bedtime. Continuously     Selenium 200 MCG CAPS Take 200 mcg by mouth daily.     telmisartan (MICARDIS) 80 MG tablet Take 1 tablet (80 mg total) by mouth daily. 90 tablet 1   Vibegron 75 MG TABS Take 75 mg by mouth. 1 tablet Daily     VITAMIN D-VITAMIN K PO Take 10,000 Units by mouth daily. Takes Vitamin D3 plus K2 once daily     diltiazem (CARDIZEM CD) 120 MG 24 hr capsule Take  1 capsule (120 mg total) by mouth at bedtime. 90 capsule 3   No current facility-administered medications for this visit.    Allergies:   Beef-derived products, Lambs quarters, Mucinex [guaifenesin er], Pork-derived products, Darvon [propoxyphene], Ace inhibitors, Hydrocodone, Ppd [tuberculin purified protein derivative], Hydromet [hydrocodone bit-homatrop mbr], and Sulfonamide derivatives   Social History:  The patient  reports that she has never smoked. She has never been exposed to tobacco smoke. She has never used smokeless tobacco. She reports that she does not drink alcohol and does not use drugs.   Family History:  The patient's family history includes Anxiety disorder in her father; Arthritis in her mother; Breast cancer in her mother; Cancer in her mother; Cervical cancer in her mother; Diabetes in her brother, father, and maternal grandmother; Heart disease in her father; Hyperlipidemia in her father and mother; Hypertension in her brother, brother, father, and mother; Obesity in her daughter, father, maternal grandmother, and mother; Stroke in her brother; Sudden death in her father; Uterine cancer in her mother; Varicose Veins in her father.    ROS:  Please see the history of present illness.   Otherwise, review of systems is positive for none.   All other systems are reviewed and negative.    PHYSICAL EXAM: VS:  BP 120/84   Pulse 84   Ht 5\' 1"  (1.549 m)   Wt 262 lb 4.8 oz (119  kg)   SpO2 96%   BMI 49.56 kg/m  , BMI Body mass index is 49.56 kg/m. GEN: Well nourished, well developed, in no acute distress  HEENT: normal  Neck: no JVD, carotid bruits, or masses Cardiac: RRR; no murmurs, rubs, or gallops,no edema  Respiratory:  clear to auscultation bilaterally, normal work of breathing GI: soft, nontender, nondistended, + BS MS: no deformity or atrophy  Skin: warm and dry Neuro:  Strength and sensation are intact Psych: euthymic mood, full affect  EKG:  EKG is not ordered today. Personal review of the ekg ordered 06/05/22 shows AF, RBBB  Recent Labs: 12/26/2021: B Natriuretic Peptide 107.6 12/27/2021: Magnesium 2.2 06/29/2022: ALT 19; BUN 18; Creatinine, Ser 0.92; Hemoglobin 13.5; Platelets 184; Potassium 4.7; Sodium 141 07/06/2022: TSH 2.850    Lipid Panel     Component Value Date/Time   CHOL 150 06/29/2022 0954   TRIG 143 06/29/2022 0954   HDL 60 06/29/2022 0954   CHOLHDL 2.5 06/29/2022 0954   CHOLHDL 4.2 07/02/2013 1301   VLDL 43 (H) 07/02/2013 1301   LDLCALC 66 06/29/2022 0954     Wt Readings from Last 3 Encounters:  07/10/22 262 lb 4.8 oz (119 kg)  07/04/22 260 lb (117.9 kg)  06/05/22 263 lb 6.4 oz (119.5 kg)      Other studies Reviewed: Additional studies/ records that were reviewed today include: TTE 03/23/22  Review of the above records today demonstrates:   1. Left ventricular ejection fraction, by estimation, is 60 to 65%. The  left ventricle has normal function. The left ventricle has no regional  wall motion abnormalities. There is mild concentric left ventricular  hypertrophy. Left ventricular diastolic  parameters are consistent with Grade I diastolic dysfunction (impaired  relaxation).   2. Right ventricular systolic function is normal. The right ventricular  size is normal.   3. Left atrial size was moderately dilated.   4. The mitral valve is normal in structure. No evidence of mitral valve  regurgitation.   5. The aortic  valve is tricuspid. There is mild calcification of the  aortic valve. There is mild thickening of the aortic valve. Aortic valve  regurgitation is not visualized. Aortic valve sclerosis/calcification is  present, without any evidence of  aortic stenosis.   6. There is mild dilatation of the ascending aorta, measuring 40 mm.    ASSESSMENT AND PLAN:  1.  Paroxysmal atrial fibrillation: Currently on flecainide, diltiazem, Eliquis.  CHA2DS2-VASc of 3.  She has had more frequent episodes of atrial fibrillation.  She is now on flecainide 50 mg twice daily.  She has not had any more atrial fibrillation.  Lindamarie Maclachlan continue with current management.  I think her BMI would be prohibitive to ablation.  If she goes back into atrial fibrillation and wishes for an alternative medication, dofetilide would be reasonable.  2.  High risk medication monitoring: Currently on flecainide for atrial fibrillation.  QRS with bundle branch block but remains stable.  Jessalynn Mccowan continue for now.  3.  Secondary hypercoagulable state: Currently on Eliquis for atrial fibrillation  4.  Hypertension: Currently well-controlled    Current medicines are reviewed at length with the patient today.   The patient does not have concerns regarding her medicines.  The following changes were made today:  none  Labs/ tests ordered today include:  No orders of the defined types were placed in this encounter.    Disposition:   FU 6 months  Signed, Aalia Greulich Meredith Leeds, MD  07/10/2022 9:23 AM     [place] [address] [phone] (office) [phone] (fax)

## 2022-07-23 ENCOUNTER — Other Ambulatory Visit: Payer: Self-pay | Admitting: Internal Medicine

## 2022-08-01 DIAGNOSIS — M154 Erosive (osteo)arthritis: Secondary | ICD-10-CM | POA: Diagnosis not present

## 2022-08-01 DIAGNOSIS — M064 Inflammatory polyarthropathy: Secondary | ICD-10-CM | POA: Diagnosis not present

## 2022-08-01 DIAGNOSIS — Z6841 Body Mass Index (BMI) 40.0 and over, adult: Secondary | ICD-10-CM | POA: Diagnosis not present

## 2022-08-01 DIAGNOSIS — M5136 Other intervertebral disc degeneration, lumbar region: Secondary | ICD-10-CM | POA: Diagnosis not present

## 2022-09-03 DIAGNOSIS — B349 Viral infection, unspecified: Secondary | ICD-10-CM | POA: Diagnosis not present

## 2022-09-03 DIAGNOSIS — R509 Fever, unspecified: Secondary | ICD-10-CM | POA: Diagnosis not present

## 2022-09-03 DIAGNOSIS — Z20822 Contact with and (suspected) exposure to covid-19: Secondary | ICD-10-CM | POA: Diagnosis not present

## 2022-09-03 DIAGNOSIS — R0982 Postnasal drip: Secondary | ICD-10-CM | POA: Diagnosis not present

## 2022-09-04 DIAGNOSIS — J9601 Acute respiratory failure with hypoxia: Secondary | ICD-10-CM | POA: Diagnosis not present

## 2022-09-04 DIAGNOSIS — I871 Compression of vein: Secondary | ICD-10-CM | POA: Diagnosis not present

## 2022-09-04 DIAGNOSIS — I482 Chronic atrial fibrillation, unspecified: Secondary | ICD-10-CM | POA: Diagnosis not present

## 2022-09-04 DIAGNOSIS — N3090 Cystitis, unspecified without hematuria: Secondary | ICD-10-CM | POA: Diagnosis not present

## 2022-09-04 DIAGNOSIS — Z1152 Encounter for screening for COVID-19: Secondary | ICD-10-CM | POA: Diagnosis not present

## 2022-09-04 DIAGNOSIS — I480 Paroxysmal atrial fibrillation: Secondary | ICD-10-CM | POA: Diagnosis not present

## 2022-09-04 DIAGNOSIS — R0902 Hypoxemia: Secondary | ICD-10-CM | POA: Diagnosis not present

## 2022-09-04 DIAGNOSIS — R0609 Other forms of dyspnea: Secondary | ICD-10-CM | POA: Diagnosis not present

## 2022-09-04 DIAGNOSIS — N281 Cyst of kidney, acquired: Secondary | ICD-10-CM | POA: Diagnosis not present

## 2022-09-04 DIAGNOSIS — R188 Other ascites: Secondary | ICD-10-CM | POA: Diagnosis not present

## 2022-09-04 DIAGNOSIS — A4159 Other Gram-negative sepsis: Secondary | ICD-10-CM | POA: Diagnosis not present

## 2022-09-04 DIAGNOSIS — N10 Acute tubulo-interstitial nephritis: Secondary | ICD-10-CM | POA: Diagnosis not present

## 2022-09-04 DIAGNOSIS — N136 Pyonephrosis: Secondary | ICD-10-CM | POA: Diagnosis not present

## 2022-09-04 DIAGNOSIS — E785 Hyperlipidemia, unspecified: Secondary | ICD-10-CM | POA: Diagnosis not present

## 2022-09-04 DIAGNOSIS — N390 Urinary tract infection, site not specified: Secondary | ICD-10-CM | POA: Diagnosis not present

## 2022-09-04 DIAGNOSIS — Z743 Need for continuous supervision: Secondary | ICD-10-CM | POA: Diagnosis not present

## 2022-09-04 DIAGNOSIS — Z9884 Bariatric surgery status: Secondary | ICD-10-CM | POA: Diagnosis not present

## 2022-09-04 DIAGNOSIS — A4151 Sepsis due to Escherichia coli [E. coli]: Secondary | ICD-10-CM | POA: Diagnosis not present

## 2022-09-04 DIAGNOSIS — R0602 Shortness of breath: Secondary | ICD-10-CM | POA: Diagnosis not present

## 2022-09-04 DIAGNOSIS — N12 Tubulo-interstitial nephritis, not specified as acute or chronic: Secondary | ICD-10-CM | POA: Diagnosis not present

## 2022-09-04 DIAGNOSIS — R0603 Acute respiratory distress: Secondary | ICD-10-CM | POA: Diagnosis not present

## 2022-09-04 DIAGNOSIS — A419 Sepsis, unspecified organism: Secondary | ICD-10-CM | POA: Diagnosis not present

## 2022-09-04 DIAGNOSIS — N200 Calculus of kidney: Secondary | ICD-10-CM | POA: Diagnosis not present

## 2022-09-04 DIAGNOSIS — B9620 Unspecified Escherichia coli [E. coli] as the cause of diseases classified elsewhere: Secondary | ICD-10-CM | POA: Diagnosis not present

## 2022-09-04 DIAGNOSIS — N132 Hydronephrosis with renal and ureteral calculous obstruction: Secondary | ICD-10-CM | POA: Diagnosis not present

## 2022-09-04 DIAGNOSIS — R000 Tachycardia, unspecified: Secondary | ICD-10-CM | POA: Diagnosis not present

## 2022-09-04 DIAGNOSIS — N1330 Unspecified hydronephrosis: Secondary | ICD-10-CM | POA: Diagnosis not present

## 2022-09-04 DIAGNOSIS — R6520 Severe sepsis without septic shock: Secondary | ICD-10-CM | POA: Diagnosis not present

## 2022-09-04 DIAGNOSIS — E669 Obesity, unspecified: Secondary | ICD-10-CM | POA: Diagnosis not present

## 2022-09-04 DIAGNOSIS — K449 Diaphragmatic hernia without obstruction or gangrene: Secondary | ICD-10-CM | POA: Diagnosis not present

## 2022-09-04 DIAGNOSIS — Z7901 Long term (current) use of anticoagulants: Secondary | ICD-10-CM | POA: Diagnosis not present

## 2022-09-04 DIAGNOSIS — Z6841 Body Mass Index (BMI) 40.0 and over, adult: Secondary | ICD-10-CM | POA: Diagnosis not present

## 2022-09-04 DIAGNOSIS — J9690 Respiratory failure, unspecified, unspecified whether with hypoxia or hypercapnia: Secondary | ICD-10-CM | POA: Diagnosis not present

## 2022-09-04 DIAGNOSIS — E038 Other specified hypothyroidism: Secondary | ICD-10-CM | POA: Diagnosis not present

## 2022-09-04 DIAGNOSIS — R6521 Severe sepsis with septic shock: Secondary | ICD-10-CM | POA: Diagnosis not present

## 2022-09-04 DIAGNOSIS — Z9989 Dependence on other enabling machines and devices: Secondary | ICD-10-CM | POA: Diagnosis not present

## 2022-09-04 DIAGNOSIS — R7881 Bacteremia: Secondary | ICD-10-CM | POA: Diagnosis not present

## 2022-09-04 DIAGNOSIS — N201 Calculus of ureter: Secondary | ICD-10-CM | POA: Diagnosis not present

## 2022-09-04 DIAGNOSIS — Z9049 Acquired absence of other specified parts of digestive tract: Secondary | ICD-10-CM | POA: Diagnosis not present

## 2022-09-04 DIAGNOSIS — N179 Acute kidney failure, unspecified: Secondary | ICD-10-CM | POA: Diagnosis not present

## 2022-09-04 DIAGNOSIS — I10 Essential (primary) hypertension: Secondary | ICD-10-CM | POA: Diagnosis not present

## 2022-09-04 DIAGNOSIS — G928 Other toxic encephalopathy: Secondary | ICD-10-CM | POA: Diagnosis not present

## 2022-09-04 DIAGNOSIS — D696 Thrombocytopenia, unspecified: Secondary | ICD-10-CM | POA: Diagnosis not present

## 2022-09-04 DIAGNOSIS — I499 Cardiac arrhythmia, unspecified: Secondary | ICD-10-CM | POA: Diagnosis not present

## 2022-09-11 ENCOUNTER — Ambulatory Visit: Payer: Medicare PPO | Admitting: Internal Medicine

## 2022-09-11 ENCOUNTER — Telehealth (HOSPITAL_COMMUNITY): Payer: Self-pay | Admitting: *Deleted

## 2022-09-11 ENCOUNTER — Encounter: Payer: Self-pay | Admitting: Family Medicine

## 2022-09-11 DIAGNOSIS — R918 Other nonspecific abnormal finding of lung field: Secondary | ICD-10-CM

## 2022-09-11 NOTE — Telephone Encounter (Signed)
Pt called in stating while visiting her daughter in [place] was hospitalized with septic shock and has been in afib since leaving the hospital. Pt HR currently is 95. Pt has PRN cardizem she can use for elevated rates until she returns to [place]. If still in afib when she returns she will call to be seen. Pt in agreement.

## 2022-09-12 ENCOUNTER — Other Ambulatory Visit: Payer: Self-pay | Admitting: *Deleted

## 2022-09-12 ENCOUNTER — Encounter: Payer: Self-pay | Admitting: *Deleted

## 2022-09-12 ENCOUNTER — Ambulatory Visit: Payer: Self-pay | Admitting: *Deleted

## 2022-09-12 NOTE — Patient Outreach (Signed)
Care Coordination   Initial Visit Note   09/12/2022  Name: Rebekah [name] MRN: [id number] DOB: 1956-03-28  AJANA DITSWORTH is a 67 y.o. year old female who sees Raliegh Ip, DO for primary care. I spoke with Shea Evans by phone today.  What matters to the patients health and wellness today?  No interventions identified.  Patient denies need for social work involvement at this time.   Goals Addressed             This Visit's Progress    COMPLETED: Assess Need for Social Work Involvement.   On track    Care Coordination Interventions:  Interventions Today    Flowsheet Row Most Recent Value  Chronic Disease   Chronic disease during today's visit Hypertension (HTN), Atrial Fibrillation (AFib), Other  [Fatigue, Morbid Obesity & Shortness of Breath with Exertion]  General Interventions   General Interventions Discussed/Reviewed General Interventions Discussed, Labs, Vaccines, Doctor Visits, Health Screening, General Interventions Reviewed, Annual Eye Exam, Durable Medical Equipment (DME), Community Resources, Level of Care  [Encouraged]  Labs Hgb A1c annually  [Encouraged]  Vaccines COVID-19, Flu, Pneumonia, RSV, Shingles, Tetanus/Pertussis/Diphtheria  [Encouraged]  Doctor Visits Discussed/Reviewed Doctor Visits Discussed, Specialist, Doctor Visits Reviewed, Annual Wellness Visits, PCP  [Encouraged]  Health Screening Bone Density, Colonoscopy, Mammogram  [Encouraged]  PCP/Specialist Visits Compliance with follow-up visit  [Encouraged]  Exercise Interventions   Exercise Discussed/Reviewed Exercise Discussed, Exercise Reviewed, Physical Activity, Weight Managment  [Encouraged]  Physical Activity Discussed/Reviewed Physical Activity Discussed, Home Exercise Program (HEP), Physical Activity Reviewed, Types of exercise  [Encouraged]  Weight Management Weight loss  [Encouraged]  Education Interventions   Education Provided Provided Education  Provided Verbal Education On Nutrition,  Mental Health/Coping with Illness, When to see the doctor, Eye Care, Labs, Exercise, Medication, Insurance Plans, [employer]  [Encouraged]  Mental Health Interventions   Mental Health Discussed/Reviewed Mental Health Discussed, Anxiety, Depression, Mental Health Reviewed, Grief and Loss, Substance Abuse, Coping Strategies, Suicide, Other, Crisis  [Domestic Violence]  Nutrition Interventions   Nutrition Discussed/Reviewed Carbohydrate meal planning, Decreasing sugar intake, Portion sizes, Decreasing salt, Fluid intake, Decreasing fats, Increasing proteins, Adding fruits and vegetables, Nutrition Discussed, Nutrition Reviewed  [Encouraged]  Pharmacy Interventions   Pharmacy Dicussed/Reviewed Pharmacy Topics Discussed, Pharmacy Topics Reviewed, Medication Adherence, Affording Medications  [Encouraged]  Safety Interventions   Safety Discussed/Reviewed Safety Discussed, Safety Reviewed, Fall Risk  [Encouraged]  Advanced Directive Interventions   Advanced Directives Discussed/Reviewed Advanced Directives Discussed  [Completed]     Danford Bad, BSW, MSW, LCSW  Licensed Clinical Social Worker  [facility]  Mailing [place] [address], [place], [place] [address] Physical Address[address], [place], [place] [address] Toll Free Main # [phone] Fax # [phone] Cell # [phone] [email or web address]        SDOH assessments and interventions completed:  Yes.  SDOH Interventions Today    Flowsheet Row Most Recent Value  SDOH Interventions   Food Insecurity Interventions Intervention Not Indicated  Housing Interventions Intervention Not Indicated  Utilities Interventions Intervention Not Indicated  Alcohol Usage Interventions Intervention Not Indicated (Score <7)  Financial Strain Interventions Intervention Not Indicated  Physical Activity Interventions Patient Declined  Stress Interventions Intervention Not  Indicated, Offered [employer], Provide Counseling  Social Connections Interventions Intervention Not Indicated     Care Coordination Interventions:  No, not indicated.   Follow up plan: No further intervention required.   Encounter Outcome:  Pt. Visit Completed.   Danford Bad,  BSW, MSW, LCSW  Licensed Restaurant manager, [employer] [facility]  Mailing [place] [address], [place], [place] [address] Physical Address[address], [place], [place] [address] Toll Free Main # [phone] Fax # [phone] Cell # [phone] [email or web address]

## 2022-09-12 NOTE — Patient Outreach (Signed)
  Care Coordination   09/12/2022  Name: MARIETOU HARPSTER MRN: [id number] DOB: Mar 04, 1956   Care Coordination Outreach Attempts:  An unsuccessful telephone outreach was attempted today to offer the patient information about available care coordination services.  HIPAA compliant message left on voicemail, providing contact information for CSW, encouraging patient to return CSW's call at her earliest convenience.  Follow Up Plan:  Additional outreach attempts will be made to offer the patient care coordination information and services.   Encounter Outcome:  No Answer.   Care Coordination Interventions:  No, not indicated.    Danford Bad, BSW, MSW, LCSW  Licensed Restaurant manager, [employer] food [facility]  Mailing [place] [address], [place], [place] [address] Physical [address] [place], [place] [address] Toll Free Main # [phone] Fax # [phone] Cell # [phone] [email or web address]

## 2022-09-12 NOTE — Patient Instructions (Signed)
Visit Information  Thank you for taking time to visit with me today. Please don't hesitate to contact me if I can be of assistance to you.   Following are the goals we discussed today:   Goals Addressed             This Visit's Progress    COMPLETED: Assess Need for Social Work Involvement.   On track    Care Coordination Interventions:  Interventions Today    Flowsheet Row Most Recent Value  Chronic Disease   Chronic disease during today's visit Hypertension (HTN), Atrial Fibrillation (AFib), Other  [Fatigue, Morbid Obesity & Shortness of Breath with Exertion]  General Interventions   General Interventions Discussed/Reviewed General Interventions Discussed, Labs, Vaccines, Doctor Visits, Health Screening, General Interventions Reviewed, Annual Eye Exam, Durable Medical Equipment (DME), Community Resources, Level of Care  [Encouraged]  Labs Hgb A1c annually  [Encouraged]  Vaccines COVID-19, Flu, Pneumonia, RSV, Shingles, Tetanus/Pertussis/Diphtheria  [Encouraged]  Doctor Visits Discussed/Reviewed Doctor Visits Discussed, Specialist, Doctor Visits Reviewed, Annual Wellness Visits, PCP  [Encouraged]  Health Screening Bone Density, Colonoscopy, Mammogram  [Encouraged]  PCP/Specialist Visits Compliance with follow-up visit  [Encouraged]  Exercise Interventions   Exercise Discussed/Reviewed Exercise Discussed, Exercise Reviewed, Physical Activity, Weight Managment  [Encouraged]  Physical Activity Discussed/Reviewed Physical Activity Discussed, Home Exercise Program (HEP), Physical Activity Reviewed, Types of exercise  [Encouraged]  Weight Management Weight loss  [Encouraged]  Education Interventions   Education Provided Provided Education  Provided Verbal Education On Nutrition, Mental Health/Coping with Illness, When to see the doctor, Eye Care, Labs, Exercise, Medication, Insurance Plans, [employer]  [Encouraged]  Mental Health Interventions   Mental Health Discussed/Reviewed  Mental Health Discussed, Anxiety, Depression, Mental Health Reviewed, Grief and Loss, Substance Abuse, Coping Strategies, Suicide, Other, Crisis  [Domestic Violence]  Nutrition Interventions   Nutrition Discussed/Reviewed Carbohydrate meal planning, Decreasing sugar intake, Portion sizes, Decreasing salt, Fluid intake, Decreasing fats, Increasing proteins, Adding fruits and vegetables, Nutrition Discussed, Nutrition Reviewed  [Encouraged]  Pharmacy Interventions   Pharmacy Dicussed/Reviewed Pharmacy Topics Discussed, Pharmacy Topics Reviewed, Medication Adherence, Affording Medications  [Encouraged]  Safety Interventions   Safety Discussed/Reviewed Safety Discussed, Safety Reviewed, Fall Risk  [Encouraged]  Advanced Directive Interventions   Advanced Directives Discussed/Reviewed Advanced Directives Discussed  [Completed]     Danford Bad, BSW, MSW, LCSW  Licensed Clinical Social Worker  [facility]  Mailing [place] [address] [place], [place] [address] Physical Address[address], [place], [place] [address] Toll Free Main # [phone] Fax # [phone] Cell # [phone] [email or web address]      Please call the care guide team at [phone] if you need to cancel or reschedule your appointment.   If you are experiencing a Mental Health or Behavioral Health Crisis or need someone to talk to, please call the Suicide and Crisis Lifeline: 988 call the [place] National Suicide Prevention Lifeline: (442)437-5763 or TTY: 614-659-2306 TTY 2891837611) to talk to a trained counselor call 1-800-273-TALK (toll free, 24 hour hotline) go to [facility] [address], [place] [phone]) call the [facility] Crisis Line: (940)008-9732 call 911  Patient verbalizes understanding of instructions and care plan provided today and agrees to view in MyChart. Active MyChart status and  patient understanding of how to access instructions and care plan via MyChart confirmed with patient.     No further follow up required.  Danford Bad, BSW, MSW, LCSW  Licensed Administrator, sports  [facility]  Mailing [address] [place], [place] [address] Physical Address[address] [place], [place] [address] Toll Free Main # [phone] Fax # [phone] Cell # [phone] [email or web address]

## 2022-09-17 ENCOUNTER — Telehealth: Payer: Self-pay | Admitting: Emergency Medicine

## 2022-09-17 DIAGNOSIS — R509 Fever, unspecified: Secondary | ICD-10-CM | POA: Diagnosis not present

## 2022-09-17 DIAGNOSIS — B379 Candidiasis, unspecified: Secondary | ICD-10-CM | POA: Diagnosis not present

## 2022-09-17 NOTE — Telephone Encounter (Signed)
Patient is scheduled pulmonary consult with Dr. Delton Coombes 09/28/22 for lung nodules. Care everywhere has CTA report 09/07/22 at [facility].  No images located in epic or PACS.   I called patient and left detailed message to request she bring CD of any recent chest images or request image be sent to office for pulmonary consult. Call back number given for any questions.

## 2022-09-20 ENCOUNTER — Telehealth: Payer: Self-pay

## 2022-09-20 NOTE — Telephone Encounter (Signed)
   Pre-operative Risk Assessment    Patient Name: Rebekah [name]  DOB: 1955-09-24 MRN: [id number]      Request for Surgical Clearance    Procedure:   Cysto; Left Ureteroscopy, stone extraction & stent exchange  Date of Surgery:  Clearance TBD                                 Surgeon:  Dr. Berniece Salines Surgeon's Group or Practice Name:  [facility] Phone number:  [phone] Fax number:  [phone]   Type of Clearance Requested:   - Medical  - Pharmacy:  Hold Apixaban (Eliquis) pt will need instructions on when/if to hold   Type of Anesthesia:  Not Indicated   Additional requests/questions:    Signed, Zada Finders   09/20/2022, 9:14 AM

## 2022-09-20 NOTE — Telephone Encounter (Signed)
   Name: Rebekah [name]  DOB: [birth year]  MRN: [id number]  Primary Cardiologist: Chrystie Nose, MD  Chart reviewed as part of pre-operative protocol coverage. Because of [name]'s past medical history and time since last visit, she will require a follow-up telephone visit in order to better assess preoperative cardiovascular risk.  Pre-op covering staff: - Please schedule appointment and call patient to inform them. If patient already had an upcoming appointment within acceptable timeframe, please add "pre-op clearance" to the appointment notes so provider is aware. - Please contact requesting surgeon's office via preferred method (i.e, phone, fax) to inform them of need for appointment prior to surgery.  Per office protocol, patient can hold Eliquis for 2 days prior to procedure.   Patient will not need bridging with Lovenox (enoxaparin) around procedure.  Sharlene Dory, PA-C  09/20/2022, 4:35 PM

## 2022-09-20 NOTE — Telephone Encounter (Signed)
Patient with diagnosis of atrial fibrillation on Eliquis for anticoagulation.    Procedure:   Cysto; Left Ureteroscopy, stone extraction & stent exchange   Date of Surgery:  Clearance TBD    CHA2DS2-VASc Score = 4   This indicates a 4.8% annual risk of stroke. The patient's score is based upon: CHF History: 0 HTN History: 1 Diabetes History: 1 Stroke History: 0 Vascular Disease History: 0 Age Score: 1 Gender Score: 1    CrCl 98 Platelet count 82  Per office protocol, patient can hold Eliquis for 2 days prior to procedure.   Patient will not need bridging with Lovenox (enoxaparin) around procedure.  **This guidance is not considered finalized until pre-operative APP has relayed final recommendations.**

## 2022-09-21 ENCOUNTER — Telehealth: Payer: Self-pay

## 2022-09-21 DIAGNOSIS — Z882 Allergy status to sulfonamides status: Secondary | ICD-10-CM | POA: Diagnosis not present

## 2022-09-21 DIAGNOSIS — N201 Calculus of ureter: Secondary | ICD-10-CM | POA: Diagnosis not present

## 2022-09-21 DIAGNOSIS — R5383 Other fatigue: Secondary | ICD-10-CM | POA: Diagnosis not present

## 2022-09-21 DIAGNOSIS — Z7901 Long term (current) use of anticoagulants: Secondary | ICD-10-CM | POA: Diagnosis not present

## 2022-09-21 DIAGNOSIS — B3749 Other urogenital candidiasis: Secondary | ICD-10-CM | POA: Diagnosis not present

## 2022-09-21 DIAGNOSIS — R509 Fever, unspecified: Secondary | ICD-10-CM | POA: Diagnosis not present

## 2022-09-21 DIAGNOSIS — R8279 Other abnormal findings on microbiological examination of urine: Secondary | ICD-10-CM | POA: Diagnosis not present

## 2022-09-21 DIAGNOSIS — Z888 Allergy status to other drugs, medicaments and biological substances status: Secondary | ICD-10-CM | POA: Diagnosis not present

## 2022-09-21 DIAGNOSIS — M1990 Unspecified osteoarthritis, unspecified site: Secondary | ICD-10-CM | POA: Diagnosis not present

## 2022-09-21 DIAGNOSIS — Z885 Allergy status to narcotic agent status: Secondary | ICD-10-CM | POA: Diagnosis not present

## 2022-09-21 DIAGNOSIS — I10 Essential (primary) hypertension: Secondary | ICD-10-CM | POA: Diagnosis not present

## 2022-09-21 DIAGNOSIS — I482 Chronic atrial fibrillation, unspecified: Secondary | ICD-10-CM | POA: Diagnosis not present

## 2022-09-21 DIAGNOSIS — Z1152 Encounter for screening for COVID-19: Secondary | ICD-10-CM | POA: Diagnosis not present

## 2022-09-21 DIAGNOSIS — R899 Unspecified abnormal finding in specimens from other organs, systems and tissues: Secondary | ICD-10-CM | POA: Diagnosis not present

## 2022-09-21 DIAGNOSIS — E038 Other specified hypothyroidism: Secondary | ICD-10-CM | POA: Diagnosis not present

## 2022-09-21 DIAGNOSIS — Z6841 Body Mass Index (BMI) 40.0 and over, adult: Secondary | ICD-10-CM | POA: Diagnosis not present

## 2022-09-21 NOTE — Telephone Encounter (Signed)
Pt was contacted and scheduled for a tele on 6/19 at 10am. Med rec and consent done.     Patient Consent for Virtual Visit        [name] has provided verbal consent on 09/21/2022 for a virtual visit (video or telephone).   CONSENT FOR VIRTUAL VISIT FOR:  Rebekah [name]  By participating in this virtual visit I agree to the following:  I hereby voluntarily request, consent and authorize [facility] and its employed or contracted physicians, physician assistants, nurse practitioners or other licensed health care professionals (the Practitioner), to provide me with telemedicine health care services (the "Services") as deemed necessary by the treating Practitioner. I acknowledge and consent to receive the Services by the Practitioner via telemedicine. I understand that the telemedicine visit will involve communicating with the Practitioner through live audiovisual communication technology and the disclosure of certain medical information by electronic transmission. I acknowledge that I have been given the opportunity to request an in-person assessment or other available alternative prior to the telemedicine visit and am voluntarily participating in the telemedicine visit.  I understand that I have the right to withhold or withdraw my consent to the use of telemedicine in the course of my care at any time, without affecting my right to future care or treatment, and that the Practitioner or I may terminate the telemedicine visit at any time. I understand that I have the right to inspect all information obtained and/or recorded in the course of the telemedicine visit and may receive copies of available information for a reasonable fee.  I understand that some of the potential risks of receiving the Services via telemedicine include:  Delay or interruption in medical evaluation due to technological equipment failure or disruption; Information transmitted may not be sufficient (e.g. poor  resolution of images) to allow for appropriate medical decision making by the Practitioner; and/or  In rare instances, security protocols could fail, causing a breach of personal health information.  Furthermore, I acknowledge that it is my responsibility to provide information about my medical history, conditions and care that is complete and accurate to the best of my ability. I acknowledge that Practitioner's advice, recommendations, and/or decision may be based on factors not within their control, such as incomplete or inaccurate data provided by me or distortions of diagnostic images or specimens that may result from electronic transmissions. I understand that the practice of medicine is not an exact science and that Practitioner makes no warranties or guarantees regarding treatment outcomes. I acknowledge that a copy of this consent can be made available to me via my patient portal [facility] MyChart), or I can request a printed copy by calling the office of [facility].    I understand that my insurance will be billed for this visit.   I have read or had this consent read to me. I understand the contents of this consent, which adequately explains the benefits and risks of the Services being provided via telemedicine.  I have been provided ample opportunity to ask questions regarding this consent and the Services and have had my questions answered to my satisfaction. I give my informed consent for the services to be provided through the use of telemedicine in my medical care

## 2022-09-21 NOTE — Telephone Encounter (Signed)
Pt was contacted and scheduled for a tele on 6/19 at 10am. Med rec and consent done.

## 2022-09-22 DIAGNOSIS — R899 Unspecified abnormal finding in specimens from other organs, systems and tissues: Secondary | ICD-10-CM | POA: Diagnosis not present

## 2022-09-22 DIAGNOSIS — B3749 Other urogenital candidiasis: Secondary | ICD-10-CM | POA: Diagnosis not present

## 2022-09-22 DIAGNOSIS — N201 Calculus of ureter: Secondary | ICD-10-CM | POA: Diagnosis not present

## 2022-09-25 ENCOUNTER — Telehealth: Payer: Self-pay

## 2022-09-25 NOTE — Transitions of Care (Post Inpatient/ED Visit) (Signed)
   09/25/2022  Name: Rebekah [name] MRN: [id number] DOB: 25-May-1955  Today's TOC FU Call Status: Today's TOC FU Call Status:: Successful TOC FU Call Competed TOC FU Call Complete Date: 09/25/22  Transition Care Management Follow-up Telephone Call Date of Discharge: 09/22/22 Discharge Facility: Other ([facility]) Name of Other ([facility]) [facility]charge Facility: [facility] Type of Discharge: Inpatient Admission Primary Inpatient Discharge Diagnosis:: Abdominal Pain How have you been since you were released from the hospital?: Better Any questions or concerns?: No  Items Reviewed: Did you receive and understand the discharge instructions provided?: Yes Medications obtained,verified, and reconciled?: No Medications Not Reviewed Reasons:: Other: (Patient was on a train coming back to [place] from [place] and phone call was dropped due to poor service.) Any new allergies since your discharge?: No Dietary orders reviewed?: No Do you have support at home?: Yes People in Home: spouse Name of Support/Comfort Primary Source: Marilu Favre  Medications Reviewed Today: Unable as patient on train coming back from [place].  Home Care and Equipment/Supplies: Were Home Health Services Ordered?: No Any new equipment or medical supplies ordered?: No  Functional Questionnaire: Do you need assistance with bathing/showering or dressing?: No Do you need assistance with meal preparation?: No Do you need assistance with eating?: No Do you have difficulty maintaining continence: No Do you need assistance with getting out of bed/getting out of a chair/moving?: No Do you have difficulty managing or taking your medications?: No  Follow up appointments reviewed: PCP Follow-up appointment confirmed?: No MD Provider Line Number:[phone] Given: No (Patient to call when she returns to [facility]) [facility] Follow-up appointment confirmed?: Yes Date of Specialist follow-up  appointment?: 09/28/22 Follow-Up Specialty Provider:: Levy Pupa, MD Do you need transportation to your follow-up appointment?: No Do you understand care options if your condition(s) worsen?: Yes-patient verbalized understanding  SDOH Interventions Today    Flowsheet Row Most Recent Value  SDOH Interventions   Food Insecurity Interventions Intervention Not Indicated  Housing Interventions Intervention Not Indicated  Transportation Interventions Intervention Not Indicated      Jodelle Gross, RN, BSN, CCM Care Management Coordinator [facility] Phone: [phone]/Fax: [phone]

## 2022-09-27 ENCOUNTER — Other Ambulatory Visit: Payer: Self-pay | Admitting: Internal Medicine

## 2022-09-27 NOTE — Telephone Encounter (Signed)
Prescription refill request for Eliquis received. Indication: PAF Last office visit: 07/10/22  Carleene Mains MD Scr: 0.88 on 09/21/22  Epic Age: 67 Weight: 119kg   Based on above findings Eliquis 5mg  twice daily is the appropriate dose.  Refill approved.

## 2022-09-28 ENCOUNTER — Ambulatory Visit: Payer: Medicare PPO | Admitting: Emergency Medicine

## 2022-09-28 ENCOUNTER — Encounter: Payer: Self-pay | Admitting: Internal Medicine

## 2022-09-28 ENCOUNTER — Encounter: Payer: Self-pay | Admitting: Emergency Medicine

## 2022-09-28 VITALS — BP 168/84 | HR 70 | Temp 98.4°F | Ht 61.0 in | Wt 275.0 lb

## 2022-09-28 DIAGNOSIS — R0683 Snoring: Secondary | ICD-10-CM

## 2022-09-28 DIAGNOSIS — R918 Other nonspecific abnormal finding of lung field: Secondary | ICD-10-CM | POA: Diagnosis not present

## 2022-09-28 DIAGNOSIS — R9389 Abnormal findings on diagnostic imaging of other specified body structures: Secondary | ICD-10-CM

## 2022-09-28 DIAGNOSIS — I2729 Other secondary pulmonary hypertension: Secondary | ICD-10-CM

## 2022-09-28 NOTE — Patient Instructions (Signed)
We will repeat your CT scan of the chest in August 2024 to compare with your prior scan at [facility] We will arrange for home sleep study Continue your oxygen at 3 L/min while sleeping for now.  We will determine whether any changes are indicated or whether you might benefit from CPAP You will probably need a repeat echocardiogram at some point going forward to compare with your priors in [place] and also the echo that you had in [place] We will check ambulatory oximetry today to see if you qualify for oxygen while exerting yourself Follow Dr. Delton Coombes in August after your CT and other testing so we can review together.

## 2022-09-28 NOTE — Progress Notes (Unsigned)
Subjective:    Patient ID: Rebekah [name], female    DOB: [birth year], 67 y.o.   MRN: [id number]  HPI 67 year old retired Engineer, civil (consulting), never smoker, with a history of atrial fibrillation on Eliquis, inflammatory arthritis on hydroxychloroquine, hypothyroidism, renal calculi with history of ureteral stent placement the past, GERD, hypertension, obesity with a gastric sleeve in 2015, latent TB (treated for 6 months with INH), nocturnal hypoxemia. I have seen her in the remote past for OSA/OHS, COP following hospitalization 2008.   She was admitted to [facility] in [place] in mid May for E. coli and Klebsiella bacteremia, presumed urinary source, septic shock.  She required intubation mechanical ventilation during that hospitalization.  There was some presumption that she had obstructive sleep apnea and she used CPAP during the hospitalization as well postextubation.  An echocardiogram showed RV dilation and evaluation consistent with secondary PAH A CT-PA was done during the hospitalization on 09/07/2022 as below She is doing ok since she got discharged but she is still weak. She is sleeping with 3L/min. No cough except w PND.   CT-PA 09/07/2022 [facility]) showed no evidence of PE, mild enlargement of the RV and a dilated main PA 3.8 cm.  There were some scattered patchy groundglass opacities in the right apex, scattered solid and semisolid pulmonary nodules largest 8 mm in the anterior left upper lobe. 4 mm apical right upper lobe x2 , 7 mm apical right upper lobe, mixed groundglass left upper lobe 8 mm, 6 mm solid left upper lobe anterior, 5 mm groundglass right lower lobe lateral basal    Review of Systems As per HPI  Past Medical History:  Diagnosis Date   Allergy    Multiple   Anxiety    Arthritis    Atrial fibrillation with rapid ventricular response (HCC) 12/26/2021   Atrial fibrillation with RVR (HCC) 12/27/2021   Bilateral bunions    Cataract    Both eyes-  lenses replaced   Chronic kidney disease 2022   Not kidney disease but kidney stone   Complication of anesthesia    Depression    More anxiety-worry-stress   Difficulty sleeping    prior sleep study did not reveal sleep apnea per patient   DJD (degenerative joint disease)    Dyspnea    Dysrhythmia    a-fib   Environmental allergies    Food allergy    Gallbladder problem    GERD (gastroesophageal reflux disease)    Heart murmur    History of kidney stones    Hypertension    no meds after weight loss   Hypothyroidism    Incontinence of urine    at nite    Joint pain    Left atrial dilatation    Left foot pain    Leg edema    Obesity    s/p gastric sleeve 12/2013 (previously weighed close to 400 lbs)   Osteoarthritis    Oxygen deficiency    3L/min for sleep due to slowed reaperations during sleep   Palpitations    Paroxysmal atrial fibrillation (HCC)    Pneumonia    hx of    PONV (postoperative nausea and vomiting)    Status post bilateral knee replacements    Supplemental oxygen dependent    uses 2l/[place] at night, states HR goes low and O2 drops   Tuberculosis    had 6 month of INH due to exposure    Vaginal vault prolapse      Family History  Problem Relation Age of Onset   Diabetes Father    Hyperlipidemia Father    Hypertension Father    Heart disease Father    Sudden death Father    Anxiety disorder Father    Obesity Father    Varicose Veins Father    Uterine cancer Mother    Cervical cancer Mother    Breast cancer Mother    Cancer Mother        cervical and brreast cancer   Hypertension Mother    Hyperlipidemia Mother    Obesity Mother    Arthritis Mother    Diabetes Maternal Grandmother    Obesity Maternal Grandmother    Diabetes Brother    Hypertension Brother    Stroke Brother    Hypertension Brother    Obesity Daughter      Social History   Socioeconomic History   Marital status: Married    Spouse name: Kimblery Teoh   Number of  children: 2   Years of education: 16   Highest education level: Bachelor's degree (e.g., BA, AB, BS)  Occupational History   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 Nothing else.  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   Social Determinants of Health   Financial Resource Strain: Low Risk  (09/12/2022)   Overall Financial Resource Strain (CARDIA)    Difficulty of Paying Living Expenses: Not hard at all  Food Insecurity: No Food Insecurity (09/25/2022)   Hunger Vital Sign    Worried About Running Out of Food in the Last Year: Never true    Ran Out of Food in the Last Year: Never true  Transportation Needs: No Transportation Needs (09/25/2022)   PRAPARE - Administrator, Civil Service (Medical): No    Lack of Transportation (Non-Medical): No  Physical Activity: Inactive (09/12/2022)   Exercise Vital Sign    Days of Exercise per Week: 0 days    Minutes of Exercise per Session: 0 min  Stress: No Stress Concern Present (09/12/2022)   [employer] of Occupational Health - Occupational Stress Questionnaire    Feeling of Stress : Only a little  Social Connections: Socially Integrated (09/12/2022)   Social Connection and Isolation Panel [NHANES]    Frequency of Communication with Friends and Family: More than three times a week    Frequency of Social Gatherings with Friends and Family: More  than three times a week    Attends Religious Services: More than 4 times per year    Active Member of [employer] or Organizations: Yes    Attends Banker Meetings: More than 4 times per year    Marital Status: Married  Catering manager Violence: Not At Risk (09/12/2022)   Humiliation, Afraid, Rape, and Kick questionnaire    Fear of Current  or Ex-Partner: No    Emotionally Abused: No    Physically Abused: No    Sexually Abused: No     Allergies  Allergen Reactions   Beef-Derived Products Anaphylaxis    After tick bite, cannot eat beef, pork or lamb   Lambs Quarters Anaphylaxis    After tick bite cannot eat beef, pork or lamb   Mucinex [Guaifenesin Er] Anaphylaxis   Pork-Derived Products Anaphylaxis    After tick bite cannot eat beef, pork or lamb   Darvon [Propoxyphene] Other (See Comments)    Hallucinations    Ace Inhibitors Swelling and Cough    Pedal Edema   Hydrocodone Itching   Ppd [Tuberculin Purified Protein Derivative]     Always has positive testing to PPD, do not use   Hydromet [Hydrocodone Bit-Homatrop Mbr] Itching and Other (See Comments)    Severe stomach pain-face itches.    Sulfonamide Derivatives Rash     Outpatient Medications Prior to Visit  Medication Sig Dispense Refill   acetaminophen (TYLENOL) 500 MG tablet Take 1,000 mg by mouth every 6 (six) hours as needed for moderate pain or headache.     apixaban (ELIQUIS) 5 MG TABS tablet TAKE ONE TABLET BY MOUTH TWICE DAILY 180 tablet 1   cetirizine (ZYRTEC) 10 MG tablet Take 10 mg by mouth at bedtime.     diltiazem (CARDIZEM CD) 120 MG 24 hr capsule Take 1 capsule (120 mg total) by mouth at bedtime. 90 capsule 3   diltiazem (CARDIZEM CD) 240 MG 24 hr capsule Take 1 capsule (240 mg total) by mouth daily. 90 capsule 3   diltiazem (CARDIZEM) 30 MG tablet TAKE ONE TO TWO TABLETS BY MOUTH EVERY 6 HOURS AS NEEDED FOR FAST HEART RATES 30 tablet 9   EPINEPHRINE 0.3 mg/0.3 mL IJ SOAJ injection inject 0.82mls into  the muscle once as needed for anaphylaxis 2 each 3   flecainide (TAMBOCOR) 50 MG tablet Take 1 tablet (50 mg total) by mouth 2 (two) times daily. 180 tablet 3   furosemide (LASIX) 20 MG tablet TAKE 1 TABLET BY MOUTH ONCE DAILY (Patient taking differently: Take 20 mg by mouth See admin instructions. Taking 3-4 times weekly) 90 tablet 3   levothyroxine (SYNTHROID) 50 MCG tablet TAKE ONE TABLET BY MOUTH DAILY BEFORE BREAKFAST 90 tablet 1   OXYGEN Inhale 3 L/min into the lungs at bedtime. Continuously     telmisartan (MICARDIS) 80 MG tablet Take 1 tablet (80 mg total) by mouth daily. 90 tablet 3   Vibegron 75 MG TABS Take 75 mg by mouth at bedtime.     busPIRone (BUSPAR) 10 MG tablet Take 1 tablet (10 mg total) by mouth 2 (two) times daily. (Patient not taking: Reported on 09/28/2022) 180 tablet 0   fluticasone (FLONASE) 50 MCG/ACT nasal spray Place 1 spray into both nostrils daily as needed for allergies or rhinitis. (Patient not taking: Reported on 09/28/2022)     hydroxychloroquine (PLAQUENIL) 200 MG tablet Take 200 mg by mouth 2 (two) times daily. (Patient not taking: Reported on 09/28/2022)     VITAMIN D-VITAMIN K PO Take 1 tablet by mouth daily. Takes Vitamin D3 plus K2 once daily (Patient not taking: Reported on 09/28/2022)     No facility-administered medications prior to visit.        Objective:   Physical Exam Vitals:   09/28/22 0930  BP: (!) 168/84  Pulse: 70  Temp: 98.4 F (36.9 C)  TempSrc: Oral  SpO2: 96%  Weight: 275  lb (124.7 kg)  Height: 5\' 1"  (1.549 m)   Gen: Pleasant, well-nourished, in no distress,  normal affect  ENT: No lesions,  mouth clear,  oropharynx clear, no postnasal drip  Neck: No JVD, no stridor  Lungs: No use of accessory muscles, no crackles or wheezing on normal respiration, no wheeze on forced expiration  Cardiovascular: RRR, heart sounds normal, no murmur or gallops, no peripheral edema  Musculoskeletal: No deformities, no cyanosis or  clubbing  Neuro: alert, awake, non focal  Skin: Warm, no lesions or rash      Assessment & Plan:  No problem-specific Assessment & Plan notes found for this encounter.   Levy Pupa, MD, PhD 09/28/2022, 9:47 AM  Pulmonary and Critical Care [phone] or if no answer before 7:00PM call [phone] For any issues after 7:00PM please call eLink [phone]

## 2022-09-29 ENCOUNTER — Encounter: Payer: Self-pay | Admitting: Emergency Medicine

## 2022-09-29 DIAGNOSIS — I2729 Other secondary pulmonary hypertension: Secondary | ICD-10-CM | POA: Insufficient documentation

## 2022-09-29 DIAGNOSIS — R9389 Abnormal findings on diagnostic imaging of other specified body structures: Secondary | ICD-10-CM | POA: Insufficient documentation

## 2022-09-29 NOTE — Assessment & Plan Note (Addendum)
With scattered patchy groundglass opacities particularly in the right apex noted while she was hospitalized for critical illness 09/07/2022.  Need to repeat her CT chest 11/2022 to compare.  Further workup depending on index of suspicion, any change/stability, particularly with history of latent TB (treated) as well as remote history of cryptogenic organizing pneumonia (COP/BOOP).

## 2022-09-29 NOTE — Assessment & Plan Note (Signed)
Multifactorial secondary pulmonary hypertension noted during recent hospitalization.  Suspect that OSA versus nocturnal hypoxemia at least a contributor.  Also with left-sided heart contribution (A-fib, hypertension, diastolic dysfunction).  Consider repeat echocardiogram to compare with 2023.  Favor repeat sleep study to confirm no clinically significant OSA.  If so then she would benefit from initiation CPAP.  If continued hypoxemia without OSA then we will continue her nocturnal oxygen and titrate appropriately.

## 2022-10-01 ENCOUNTER — Ambulatory Visit: Payer: Medicare PPO | Admitting: Adult Health

## 2022-10-01 NOTE — Progress Notes (Signed)
COVID Vaccine Completed:  Yes  Date of COVID positive in last 90 days:  PCP - Delynn Flavin, DO Cardiologist - Zoila Shutter, MD Pulmonology - Levy Pupa, MD  Chest x-ray - 09-22-22 [facility] EKG - 06-05-22 Epic, 09-23-22 [facility] Stress Test - 08-03-21 Epic ECHO - 09-06-22 [facility] Cardiac Cath -  Pacemaker/ICD device last checked: Spinal Cord Stimulator:  Bowel Prep -   Sleep Study - Ordered CPAP -   Fasting Blood Sugar -  Checks Blood Sugar _____ times a day  Last dose of GLP1 agonist-  N/A GLP1 instructions:  N/A   Last dose of SGLT-2 inhibitors-  N/A SGLT-2 instructions: N/A   Blood Thinner Instructions:  Eliquis Time Aspirin Instructions: Last Dose:  Activity level:  Can go up a flight of stairs and perform activities of daily living without stopping and without symptoms of chest pain or shortness of breath.  Able to exercise without symptoms  Unable to go up a flight of stairs without symptoms of     Anesthesia review: Afib, HTN, CKD, hx of murmur, RBBB, pulmonary HTN  Oxygen therapy 3L at night   Patient denies shortness of breath, fever, cough and chest pain at PAT appointment  Patient verbalized understanding of instructions that were given to them at the PAT appointment. Patient was also instructed that they will need to review over the PAT instructions again at home before surgery.

## 2022-10-01 NOTE — Progress Notes (Signed)
Surgery orders requested with Shanda Bumps at Dr. Jasmine Awe office.

## 2022-10-01 NOTE — Patient Instructions (Signed)
SURGICAL WAITING ROOM VISITATION Patients having surgery or a procedure may have no more than 2 support people in the waiting area - these visitors may rotate.    Children under the age of 58 must have an adult with them who is not the patient.  If the patient needs to stay at the hospital during part of their recovery, the visitor guidelines for inpatient rooms apply. Pre-op nurse will coordinate an appropriate time for 1 support person to accompany patient in pre-op.  This support person may not rotate.    Please refer to the [facility] website for the visitor guidelines for Inpatients (after your surgery is over and you are in a regular room).    Your procedure is scheduled on: 10-12-22   Report to [facility] Main Entrance    Report to admitting at 8:45 AM   Call this number if you have problems the morning of surgery [phone]   Do not eat food or drink liquids :After Midnight.           If you have questions, please contact your surgeon's office.   FOLLOW  ANY ADDITIONAL PRE OP INSTRUCTIONS YOU RECEIVED FROM YOUR SURGEON'S OFFICE!!!     Oral Hygiene is also important to reduce your risk of infection.                                    Remember - BRUSH YOUR TEETH THE MORNING OF SURGERY WITH YOUR REGULAR TOOTHPASTE   Take these medicines the morning of surgery with A SIP OF WATER:   Buspirone   Diltiazem  Flecainide  Levothyroxine  Tylenol if needed                              You may not have any metal on your body including hair pins, jewelry, and body piercing             Do not wear make-up, lotions, powders, perfumes or deodorant  Do not wear nail polish including gel and S&S, artificial/acrylic nails, or any other type of covering on natural nails including finger and toenails. If you have artificial nails, gel coating, etc. that needs to be removed by a nail salon please have this removed prior to surgery or surgery may need to be canceled/ delayed if  the surgeon/ anesthesia feels like they are unable to be safely monitored.   Do not shave  48 hours prior to surgery.          Do not bring valuables to the hospital. [facility] IS NOT RESPONSIBLE   FOR VALUABLES.   Contacts, dentures or bridgework may not be worn into surgery.  DO NOT BRING YOUR HOME MEDICATIONS TO THE HOSPITAL. PHARMACY WILL DISPENSE MEDICATIONS LISTED ON YOUR MEDICATION LIST TO YOU DURING YOUR ADMISSION IN THE HOSPITAL!    Patients discharged on the day of surgery will not be allowed to drive home.  Someone NEEDS to stay with you for the first 24 hours after anesthesia.              Please read over the following fact sheets you were given: IF YOU HAVE QUESTIONS ABOUT YOUR PRE-OP INSTRUCTIONS PLEASE CALL [phone] [place]  If you received a COVID test during your pre-op visit  it is requested that you wear a mask when out in public, stay away  from anyone that may not be feeling well and notify your surgeon if you develop symptoms. If you test positive for Covid or have been in contact with anyone that has tested positive in the last 10 days please notify you surgeon.  [facility] - Preparing for Surgery Before surgery, you can play an important role.  Because skin is not sterile, your skin needs to be as free of germs as possible.  You can reduce the number of germs on your skin by washing with CHG (chlorahexidine gluconate) soap before surgery.  CHG is an antiseptic cleaner which kills germs and bonds with the skin to continue killing germs even after washing. Please DO NOT use if you have an allergy to CHG or antibacterial soaps.  If your skin becomes reddened/irritated stop using the CHG and inform your nurse when you arrive at Short Stay. Do not shave (including legs and underarms) for at least 48 hours prior to the first CHG shower.  You may shave your face/neck.  Please follow these instructions carefully:  1.  Shower with CHG Soap the night before surgery and the   morning of surgery.  2.  If you choose to wash your hair, wash your hair first as usual with your normal  shampoo.  3.  After you shampoo, rinse your hair and body thoroughly to remove the shampoo.                             4.  Use CHG as you would any other liquid soap.  You can apply chg directly to the skin and wash.  Gently with a scrungie or clean washcloth.  5.  Apply the CHG Soap to your body ONLY FROM THE NECK DOWN.   Do   not use on face/ open                           Wound or open sores. Avoid contact with eyes, ears mouth and   genitals (private parts).                       Wash face,  Genitals (private parts) with your normal soap.             6.  Wash thoroughly, paying special attention to the area where your    surgery  will be performed.  7.  Thoroughly rinse your body with warm water from the neck down.  8.  DO NOT shower/wash with your normal soap after using and rinsing off the CHG Soap.                9.  Pat yourself dry with a clean towel.            10.  Wear clean pajamas.            11.  Place clean sheets on your bed the night of your first shower and do not  sleep with pets. Day of Surgery : Do not apply any lotions/deodorants the morning of surgery.  Please wear clean clothes to the hospital/surgery center.  FAILURE TO FOLLOW THESE INSTRUCTIONS MAY RESULT IN THE CANCELLATION OF YOUR SURGERY  PATIENT SIGNATURE_________________________________  NURSE SIGNATURE__________________________________  ________________________________________________________________________

## 2022-10-02 ENCOUNTER — Other Ambulatory Visit: Payer: Self-pay | Admitting: Urology

## 2022-10-02 ENCOUNTER — Other Ambulatory Visit (HOSPITAL_COMMUNITY): Payer: Medicare PPO

## 2022-10-02 DIAGNOSIS — N132 Hydronephrosis with renal and ureteral calculous obstruction: Secondary | ICD-10-CM | POA: Diagnosis not present

## 2022-10-02 DIAGNOSIS — R8271 Bacteriuria: Secondary | ICD-10-CM | POA: Diagnosis not present

## 2022-10-03 ENCOUNTER — Encounter: Payer: Medicare PPO | Admitting: Cardiology

## 2022-10-03 ENCOUNTER — Encounter: Payer: Self-pay | Admitting: Cardiology

## 2022-10-03 ENCOUNTER — Encounter (HOSPITAL_COMMUNITY): Payer: Self-pay

## 2022-10-03 ENCOUNTER — Other Ambulatory Visit: Payer: Self-pay

## 2022-10-03 ENCOUNTER — Ambulatory Visit (INDEPENDENT_AMBULATORY_CARE_PROVIDER_SITE_OTHER): Payer: Medicare PPO | Admitting: Nurse Practitioner

## 2022-10-03 ENCOUNTER — Encounter (HOSPITAL_COMMUNITY)
Admission: RE | Admit: 2022-10-03 | Discharge: 2022-10-03 | Disposition: A | Discharge status: Home / Self Care | Payer: Medicare PPO | Source: Ambulatory Visit | Attending: Urology | Admitting: Urology

## 2022-10-03 VITALS — BP 110/78 | HR 94 | Ht 61.0 in | Wt 264.2 lb

## 2022-10-03 DIAGNOSIS — Z8249 Family history of ischemic heart disease and other diseases of the circulatory system: Secondary | ICD-10-CM | POA: Insufficient documentation

## 2022-10-03 DIAGNOSIS — R0609 Other forms of dyspnea: Secondary | ICD-10-CM

## 2022-10-03 DIAGNOSIS — I129 Hypertensive chronic kidney disease with stage 1 through stage 4 chronic kidney disease, or unspecified chronic kidney disease: Secondary | ICD-10-CM | POA: Diagnosis not present

## 2022-10-03 DIAGNOSIS — Z8616 Personal history of COVID-19: Secondary | ICD-10-CM | POA: Insufficient documentation

## 2022-10-03 DIAGNOSIS — Z01810 Encounter for preprocedural cardiovascular examination: Secondary | ICD-10-CM

## 2022-10-03 DIAGNOSIS — Z79899 Other long term (current) drug therapy: Secondary | ICD-10-CM | POA: Insufficient documentation

## 2022-10-03 DIAGNOSIS — Z01818 Encounter for other preprocedural examination: Secondary | ICD-10-CM | POA: Insufficient documentation

## 2022-10-03 DIAGNOSIS — Z9884 Bariatric surgery status: Secondary | ICD-10-CM | POA: Diagnosis not present

## 2022-10-03 DIAGNOSIS — N189 Chronic kidney disease, unspecified: Secondary | ICD-10-CM | POA: Diagnosis not present

## 2022-10-03 DIAGNOSIS — I2729 Other secondary pulmonary hypertension: Secondary | ICD-10-CM | POA: Diagnosis not present

## 2022-10-03 DIAGNOSIS — I4819 Other persistent atrial fibrillation: Secondary | ICD-10-CM | POA: Diagnosis not present

## 2022-10-03 DIAGNOSIS — I10 Essential (primary) hypertension: Secondary | ICD-10-CM | POA: Diagnosis not present

## 2022-10-03 DIAGNOSIS — K219 Gastro-esophageal reflux disease without esophagitis: Secondary | ICD-10-CM | POA: Insufficient documentation

## 2022-10-03 DIAGNOSIS — N202 Calculus of kidney with calculus of ureter: Secondary | ICD-10-CM | POA: Insufficient documentation

## 2022-10-03 DIAGNOSIS — Z7901 Long term (current) use of anticoagulants: Secondary | ICD-10-CM | POA: Insufficient documentation

## 2022-10-03 DIAGNOSIS — I480 Paroxysmal atrial fibrillation: Secondary | ICD-10-CM

## 2022-10-03 DIAGNOSIS — Z6841 Body Mass Index (BMI) 40.0 and over, adult: Secondary | ICD-10-CM | POA: Insufficient documentation

## 2022-10-03 DIAGNOSIS — J9611 Chronic respiratory failure with hypoxia: Secondary | ICD-10-CM | POA: Insufficient documentation

## 2022-10-03 NOTE — Assessment & Plan Note (Signed)
Blood pressure is excellent today, continue Cardizem CD 140 mg by mouth morning, Cardizem CD 120 mg at bedtime, telmisartan 80 mg.

## 2022-10-03 NOTE — Assessment & Plan Note (Signed)
Patient in a normal sinus rhythm today with previously seen right bundle branch block.  I do not appreciate any acute ischemic changes.  Continue flecainide 50 mg twice daily.  Patient's QTc appears stable today.  Continue Eliquis 5 mg twice daily.  Continue Cardizem CD 240 mg every morning and 120 mg at bedtime.

## 2022-10-03 NOTE — Assessment & Plan Note (Addendum)
Patient with upcoming left cystoscopy scheduled with [facility] on 10/12/2022. Patient is certainly at higher risk of periprocedural complications given her history of hypoxia, chronic respiratory failure, and obesity. Although patient has notable physical deconditioning secondary to ICU admission at the end of May, I do not feel that she has any cardiovascular conditions that would make her unstable for this procedure or that would require evaluation prior to undergoing this procedure. Of note, April 2023 stress test found that LV perfusion was normal. There was no evidence of ischemia. There was no evidence of infarction.   Patient does continue to have hematuria and I do not see that labs have been checked since she returned to [place] from her May admission.  We will check a complete metabolic panel and complete blood count today.  As this is my first time seeing patient today, I will also reach out to Dr. Rennis Golden to inquire about his perspective on patient's safety for urological procedure.  If labs are stable, and Dr. Rennis Golden feels comfortable with patient proceeding, will addend this note and fax to [facility].  Per office protocol, patient can hold Eliquis for 2 days prior to procedure.   Addendum: I called and spoke with patient today regarding lab results. Upon further discussion with her, she remains understandably nervous about cardiovascular safety for surgery given such profound weakness and exertional intolerance following hospitalization. She wonders about further testing to ensure cardiovascular health stability. I think this concern is very rational. In order to better verify cardiovascular status, will repeat echocardiogram and myoview stress test prior to urological procedure. In the meantime, if patient were to have acute renal/urological decline, emergent procedure would be understandable/appropriate. Continue with above recommendation to hold Eliquis 2 days prior to  procedure.

## 2022-10-03 NOTE — Patient Instructions (Addendum)
Medication Instructions:  Your physician recommends that you continue on your current medications as directed. Please refer to the Current Medication list given to you today.  *If you need a refill on your cardiac medications before your next appointment, please call your pharmacy*   Lab Work: Cmp, Cbc- today   If you have labs (blood work) drawn today and your tests are completely normal, you will receive your results only by: MyChart Message (if you have MyChart) OR A paper copy in the mail If you have any lab test that is abnormal or we need to change your treatment, we will call you to review the results.   Testing/Procedures: None ordered    Follow-Up: At [facility], you and your health needs are our priority.  As part of our continuing mission to provide you with exceptional heart care, we have created designated Provider Care Teams.  These Care Teams include your primary Cardiologist (physician) and Advanced Practice Providers (APPs -  Physician Assistants and Nurse Practitioners) who all work together to provide you with the care you need, when you need it.  We recommend signing up for the patient portal called "MyChart".  Sign up information is provided on this After Visit Summary.  MyChart is used to connect with patients for Virtual Visits (Telemedicine).  Patients are able to view lab/test results, encounter notes, upcoming appointments, etc.  Non-urgent messages can be sent to your provider as well.   To learn more about what you can do with MyChart, go to [email or web address].    Your next appointment:   6 month(s)  Provider:   Chrystie Nose, MD     Other Instructions

## 2022-10-03 NOTE — Progress Notes (Addendum)
Cardiology Office Note:    Date:  10/03/2022   ID:  Rebekah [name], DOB September 28, 1955, MRN [id number]  PCP:  Raliegh Ip, DO  [facility] Providers Cardiologist:  Chrystie Nose, MD    Referring MD: Raliegh Ip, DO   Chief Complaint:  No chief complaint on file.    Patient Profile:  Paroxysmal atrial fibrillation On Flecainide, Diltiazem, Eliquis Hypertension CKD Chronic hypoxic respiratory failure Obesity GERD S/P laparoscopic sleeve gastrectomy Renal calculi  Cardiac Studies & Procedures     STRESS TESTS  MYOCARDIAL PERFUSION IMAGING 08/03/2021  Narrative   The study is normal. The study is low risk.   No ST deviation was noted.   LV perfusion is normal. There is no evidence of ischemia. There is no evidence of infarction.   Left ventricular function is normal. Nuclear stress EF: 57 %. The left ventricular ejection fraction is normal (55-65%). End diastolic cavity size is mildly enlarged.   Prior study available for comparison from 04/06/2015. No changes compared to prior study.   ECHOCARDIOGRAM  ECHOCARDIOGRAM COMPLETE 03/23/2022  Narrative ECHOCARDIOGRAM REPORT    Patient Name:   Rebekah [name]  Date of Exam: 03/23/2022 Medical Rec #:  [id number]     Height:       61.0 in Accession #:    [id number]    Weight:       264.8 lb Date of Birth:  03-08-1956     BSA:          2.128 m Patient Age:    66 years      BP:           128/90 mmHg Patient Gender: F             HR:           92 bpm. Exam Location:  [address]  Procedure: 2D Echo, Cardiac Doppler and Color Doppler  Indications:    R06.02 SOB  History:        Patient has prior history of Echocardiogram examinations, most recent 10/21/2018. Arrythmias:Atrial Fibrillation, RBBB and Tachycardia, Signs/Symptoms:Shortness of Breath; Risk Factors:Morbid obesity and Hypertension.  Sonographer:    Samule Ohm RDCS Referring Phys: [phone] KENNETH C HILTY   Sonographer Comments: Technically  difficult study due to poor echo windows and patient is obese. Image acquisition challenging due to patient body habitus. IMPRESSIONS   1. Left ventricular ejection fraction, by estimation, is 60 to 65%. The left ventricle has normal function. The left ventricle has no regional wall motion abnormalities. There is mild concentric left ventricular hypertrophy. Left ventricular diastolic parameters are consistent with Grade I diastolic dysfunction (impaired relaxation). 2. Right ventricular systolic function is normal. The right ventricular size is normal. 3. Left atrial size was moderately dilated. 4. The mitral valve is normal in structure. No evidence of mitral valve regurgitation. 5. The aortic valve is tricuspid. There is mild calcification of the aortic valve. There is mild thickening of the aortic valve. Aortic valve regurgitation is not visualized. Aortic valve sclerosis/calcification is present, without any evidence of aortic stenosis. 6. There is mild dilatation of the ascending aorta, measuring 40 mm.  Comparison(s): No significant change from prior study. Prior images reviewed side by side.  FINDINGS Left Ventricle: Left ventricular ejection fraction, by estimation, is 60 to 65%. The left ventricle has normal function. The left ventricle has no regional wall motion abnormalities. The left ventricular internal cavity size was normal in size. There is mild concentric left  ventricular hypertrophy. Left ventricular diastolic parameters are consistent with Grade I diastolic dysfunction (impaired relaxation). Normal left ventricular filling pressure.  Right Ventricle: The right ventricular size is normal. No increase in right ventricular wall thickness. Right ventricular systolic function is normal.  Left Atrium: Left atrial size was moderately dilated.  Right Atrium: Right atrial size was normal in size.  Pericardium: There is no evidence of pericardial effusion.  Mitral Valve: The  mitral valve is normal in structure. No evidence of mitral valve regurgitation.  Tricuspid Valve: The tricuspid valve is normal in structure. Tricuspid valve regurgitation is trivial.  Aortic Valve: The aortic valve is tricuspid. There is mild calcification of the aortic valve. There is mild thickening of the aortic valve. Aortic valve regurgitation is not visualized. Aortic valve sclerosis/calcification is present, without any evidence of aortic stenosis.  Pulmonic Valve: The pulmonic valve was normal in structure. Pulmonic valve regurgitation is not visualized.  Aorta: The aortic root is normal in size and structure. There is mild dilatation of the ascending aorta, measuring 40 mm.  IAS/Shunts: No atrial level shunt detected by color flow Doppler.   LEFT VENTRICLE PLAX 2D LVIDd:         5.40 cm   Diastology LVIDs:         3.60 cm   LV e' medial:    10.00 cm/s LV PW:         1.20 cm   LV E/e' medial:  5.3 LV IVS:        1.40 cm   LV e' lateral:   13.60 cm/s LVOT diam:     2.00 cm   LV E/e' lateral: 3.9 LV SV:         86 LV SV Index:   40 LVOT Area:     3.14 cm   RIGHT VENTRICLE             IVC RV S prime:     14.63 cm/s  IVC diam: 0.90 cm TAPSE (M-mode): 1.8 cm RVSP:           32.4 mmHg  LEFT ATRIUM             Index        RIGHT ATRIUM           Index LA diam:        4.60 cm 2.16 cm/m   RA Pressure: 3.00 mmHg LA Vol (A2C):   53.8 ml 25.28 ml/m  RA Area:     17.80 cm LA Vol (A4C):   53.5 ml 25.14 ml/m  RA Volume:   46.60 ml  21.90 ml/m LA Biplane Vol: 58.2 ml 27.35 ml/m AORTIC VALVE LVOT Vmax:   163.00 cm/s LVOT Vmean:  103.560 cm/s LVOT VTI:    0.273 m  AORTA Ao Root diam: 3.50 cm Ao Asc diam:  4.00 cm  MITRAL VALVE               TRICUSPID VALVE MV Area (PHT): 2.86 cm    TR Peak grad:   29.4 mmHg MV Decel Time: 265 msec    TR Vmax:        271.00 cm/s MV E velocity: 53.05 cm/s  Estimated RAP:  3.00 mmHg MV A velocity: 88.45 cm/s  RVSP:           32.4 mmHg MV  E/A ratio:  0.60 SHUNTS Systemic VTI:  0.27 m Systemic Diam: 2.00 cm  Rachelle Hora Croitoru MD Electronically signed by Thurmon Fair MD  Signature Date/Time: 03/23/2022/3:45:14 PM    Final              History of Present Illness:   Rebekah [name] is a 67 y.o. female with the above problem list.  She presents today for pre-surgical cardiac evaluation/clearance.  On 09/04/2022, patient presented to the hospital from home with shaking chills, fever, decreased level of consciousness.  In the emergency room she was found to be febrile with a temperature of 105 F and tachycardic.  Patient was hypotensive despite IV fluid resuscitation.  She was placed on peripheral phenylephrine and admitted to the ICU.  Admission labs were notable for a lactic acidosis, 7.0, creatinine elevated to 2.12, urinalysis indicated pyuria.  Blood cultures grew positive for gram-negative rods (E. coli and Klebsiella).  CTAP with left-sided obstructing renal stone at UVJ, patient received left ureteral stent.  This admission was complicated by A-fib with RVR, patient had spontaneous resolution to normal sinus rhythm.  Of note review of records from outside hospital indicate patient with acute hypoxic respiratory failure as well.  Kolls was unclear, suspicion for endorgan dysfunction from severe sepsis/shock.  Patient was initially hypoxic with EMS and eventually ended up on CPAP/BiPAP. Review of records shows that echo completed this admission found preserved left ventricular function and morphology with ejection fraction of 65%. The right ventricle was somewhat dilated. There was mild tricuspid insufficiency with mild-to-moderate pulmonary hypertension (estimated pulmonary arterial systolic pressure ).   Regarding afib history, patient previously used pill in pocket flecainide for breakthrough atrial fibrillation but subsequently switched to daily while sick with COVID in September 2023/with more persistent A-fib.  Patient with  spontaneous conversion to normal sinus rhythm prior to cardioversion this admission.  Patient seen in A-fib clinic following discharge found to be in A-fib, was again set up for cardioversion but converted to sinus rhythm spontaneously.  Since discharging from the hospital, patient reports notable decrease in energy and exertional tolerance.  She denies anginal symptoms or palpitations, has not noted a recurrence of her atrial fibrillation.  She describes her symptoms as more of a generalized weakness versus pure shortness of breath and since leaving the hospital has been using a walker to support ambulation.  Patient is able to walk 2-3 city blocks using the assistance of a walker before she would have to take a break to catch her breath, but does admit that it would be very tiresome to walk this distance.  Regarding orthopnea symptoms, patient says that she has not been able to lay flat at night for over a year.  Patient describes anasarca like presentation while admitted in [place] with acute illness in the ICU.  Since discharging she estimates up approximately 15 pounds of weight loss and has been taking Lasix nearly daily to assist with volume reduction.  At this point she says that most of her swelling is in her legs and is not pitting.  Regarding urinary symptoms, patient confirms ongoing hematuria.  She has pending left cystoscopy with [facility] scheduled for 10/12/2022.     Past Medical History:  Diagnosis Date   Allergy    Multiple   Anxiety    Arthritis    Atrial fibrillation with rapid ventricular response (HCC) 12/26/2021   Atrial fibrillation with RVR (HCC) 12/27/2021   Bilateral bunions    Cataract    Both eyes- lenses replaced   Chronic kidney disease 2022   Not kidney disease but kidney stone   Complication of anesthesia  Depression    More anxiety-worry-stress   Difficulty sleeping    prior sleep study did not reveal sleep apnea per patient   DJD (degenerative  joint disease)    Dyspnea    Dysrhythmia    a-fib   Environmental allergies    Food allergy    Gallbladder problem    GERD (gastroesophageal reflux disease)    Heart murmur    History of kidney stones    Hypertension    no meds after weight loss   Hypothyroidism    Incontinence of urine    at nite    Joint pain    Left atrial dilatation    Left foot pain    Leg edema    Obesity    s/p gastric sleeve 12/2013 (previously weighed close to 400 lbs)   Osteoarthritis    Oxygen deficiency    3L/min for sleep due to slowed reaperations during sleep   Palpitations    Paroxysmal atrial fibrillation (HCC)    Pneumonia    hx of    PONV (postoperative nausea and vomiting)    Status post bilateral knee replacements    Supplemental oxygen dependent    uses 2l/V[place] at night, states HR goes low and O2 drops   Tuberculosis    had 6 month of INH due to exposure    Vaginal vault prolapse    Current Medications: Current Meds  Medication Sig   acetaminophen (TYLENOL) 500 MG tablet Take 1,000 mg by mouth every 6 (six) hours as needed for moderate pain or headache.   apixaban (ELIQUIS) 5 MG TABS tablet TAKE ONE TABLET BY MOUTH TWICE DAILY   busPIRone (BUSPAR) 10 MG tablet Take 1 tablet (10 mg total) by mouth 2 (two) times daily.   cetirizine (ZYRTEC) 10 MG tablet Take 10 mg by mouth at bedtime.   diltiazem (CARDIZEM CD) 120 MG 24 hr capsule Take 1 capsule (120 mg total) by mouth at bedtime.   diltiazem (CARDIZEM CD) 240 MG 24 hr capsule Take 1 capsule (240 mg total) by mouth daily.   diltiazem (CARDIZEM) 30 MG tablet TAKE ONE TO TWO TABLETS BY MOUTH EVERY 6 HOURS AS NEEDED FOR FAST HEART RATES   EPINEPHRINE 0.3 mg/0.3 mL IJ SOAJ injection inject 0.74mls into the muscle once as needed for anaphylaxis   flecainide (TAMBOCOR) 50 MG tablet Take 1 tablet (50 mg total) by mouth 2 (two) times daily.   furosemide (LASIX) 20 MG tablet TAKE 1 TABLET BY MOUTH ONCE DAILY (Patient taking differently: Take 20  mg by mouth See admin instructions. Taking 3-4 times weekly)   levothyroxine (SYNTHROID) 50 MCG tablet TAKE ONE TABLET BY MOUTH DAILY BEFORE BREAKFAST   OXYGEN Inhale 3 L/min into the lungs at bedtime. Continuously   telmisartan (MICARDIS) 80 MG tablet Take 1 tablet (80 mg total) by mouth daily.   Vibegron 75 MG TABS Take 75 mg by mouth at bedtime.    Allergies:   Beef-derived products, Lambs quarters, Mucinex [guaifenesin er], Pork-derived products, Darvon [propoxyphene], Ace inhibitors, Hydrocodone, Ppd [tuberculin purified protein derivative], Hydromet [hydrocodone bit-homatrop mbr], and Sulfonamide derivatives   Social History   Occupational History   Occupation: Merchant navy officer: OTHER    Comment: Fish farm manager. Schools   Occupation: retired Engineer, civil (consulting) - L&D @ [employer]  Tobacco Use   Smoking status: Never    Passive exposure: Never   Smokeless tobacco: Never   Tobacco comments:    Tried 1-2 cigarettes as  a child. Nothing else.  Vaping Use   Vaping Use: Never used  Substance and Sexual Activity   Alcohol use: No   Drug use: No   Sexual activity: Not Currently    Partners: Male    Birth control/protection: Abstinence    Comment: Age, vaginal prolapse    Family Hx: The patient's family history includes Anxiety disorder in her father; Arthritis in her mother; Breast cancer in her mother; Cancer in her mother; Cervical cancer in her mother; Diabetes in her brother, father, and maternal grandmother; Heart disease in her father; Hyperlipidemia in her father and mother; Hypertension in her brother, brother, father, and mother; Obesity in her daughter, father, maternal grandmother, and mother; Stroke in her brother; Sudden death in her father; Uterine cancer in her mother; Varicose Veins in her father.  Review of Systems  Constitutional: Positive for malaise/fatigue and weight loss (with diuresis). Negative for weight gain.  Cardiovascular:  Positive for leg  swelling. Negative for chest pain, dyspnea on exertion, irregular heartbeat, orthopnea, palpitations and syncope.  Respiratory:  Positive for shortness of breath. Negative for cough.   Genitourinary:  Positive for hematuria.  All other systems reviewed and are negative.    EKGs/Labs/Other Test Reviewed:    EKG:  EKG is ordered today.  EKG Interpretation  Date/Time:  Wednesday October 03 2022 14:27:37 EDT Ventricular Rate:  93 PR Interval:  204 QRS Duration: 146 QT Interval:  390 QTC Calculation: 484 R Axis:   9 Text Interpretation: Normal sinus rhythm Right bundle branch block When compared with ECG of 05-Jun-2022 16:03, Sinus rhythm has replaced Atrial fibrillation Confirmed by Perlie Gold [phone]) on 10/03/2022 2:37:23 PM     Recent Labs: 12/26/2021: B Natriuretic Peptide 107.6 12/27/2021: Magnesium 2.2 06/29/2022: ALT 19; BUN 18; Creatinine, Ser 0.92; Hemoglobin 13.5; Platelets 184; Potassium 4.7; Sodium 141 07/06/2022: TSH 2.850   Recent Lipid Panel Recent Labs    06/29/22 0954  CHOL 150  TRIG 143  HDL 60  LDLCALC 66      Risk Assessment/Calculations/Metrics:    CHA2DS2-VASc Score = 4   This indicates a 4.8% annual risk of stroke. The patient's score is based upon: CHF History: 0 HTN History: 1 Diabetes History: 1 Stroke History: 0 Vascular Disease History: 0 Age Score: 1 Gender Score: 1             Physical Exam:    VS:  BP 110/78   Pulse 94   Ht 5\' 1"  (1.549 m)   Wt 264 lb 3.2 oz (119.8 kg)   SpO2 95%   BMI 49.92 kg/m     Wt Readings from Last 3 Encounters:  10/03/22 264 lb 3.2 oz (119.8 kg)  10/03/22 262 lb (118.8 kg)  09/28/22 275 lb (124.7 kg)    Constitutional:      Appearance: Healthy appearance. Not in distress.  Neck:     Vascular: No JVR. JVD normal.  Pulmonary:     Effort: Pulmonary effort is normal.     Breath sounds: Normal breath sounds. No wheezing. No rhonchi. No rales.  Chest:     Chest wall: Not tender to palpatation.   Cardiovascular:     PMI at left midclavicular line. Normal rate. Regular rhythm. Normal S1. Normal S2.      Murmurs: There is no murmur.     No gallop.  No click. No rub.  Pulses:    Intact distal pulses.  Edema:    Peripheral edema present.    Pretibial:  bilateral non-pitting edema of the pretibial area.    Ankle: bilateral non-pitting edema of the ankle.    Feet: bilateral non-pitting edema of the feet. Abdominal:     General: Bowel sounds are normal.     Palpations: Abdomen is soft.     Tenderness: There is no abdominal tenderness.  Musculoskeletal: Normal range of motion.        General: No tenderness. Skin:    General: Skin is warm and dry.  Neurological:     General: No focal deficit present.     Mental Status: Alert and oriented to person, place and time.         ASSESSMENT & PLAN:   Essential hypertension Blood pressure is excellent today, continue Cardizem CD 140 mg by mouth morning, Cardizem CD 120 mg at bedtime, telmisartan 80 mg.  Atrial fibrillation (HCC) Patient in a normal sinus rhythm today with previously seen right bundle branch block.  I do not appreciate any acute ischemic changes.  Continue flecainide 50 mg twice daily.  Patient's QTc appears stable today.  Continue Eliquis 5 mg twice daily.  Continue Cardizem CD 240 mg every morning and 120 mg at bedtime.  Dyspnea on exertion Patient with worsened exertional dyspnea following ICU admission in [place] at the end of May 2024.  Upon further discussion though, it sounds as if this is more generalized fatigue and weakness versus acute exacerbation of just shortness of breath. I do believe that patient's shortness of breath is multifactorial.  She has a prior history of nocturnal hypoxemia, and risk factors for exertional dyspnea including obesity and orthopedic limitations of exercise.  Suspicion for sleep apnea as well.  Echocardiogram while admitted in [place] did show an increase in pulmonary artery  pressure, please see below for further discussion.  While patient will certainly need repeat echocardiogram to further evaluate her RV and pulmonary artery pressure, I do not believe this needs to be done before upcoming cystoscopy.   Other secondary pulmonary hypertension (HCC) During admission for severe sepsis with shock at the end of May 2024, patient had an echocardiogram that revealed mild RV dilation with pulmonary artery pressure estimated at 48 mmHg.  This would be an increase of about 16 mmHg from prior echocardiogram with heart care in December 2023.  Pulmonary hypertension likely multifactorial with possible obstructive sleep apnea, nocturnal hypoxemia.  Patient was also found with A-fib and RVR paroxysmally during her admission and it is also possible that this precipitated some transient left sided heart changes (although LVEF reportedly normal on this echocardiogram).  Patient is also being followed by Dr. Delton Coombes with pulmonology, sincerely appreciate his care patient as well.  Agree with his most recent note that sleep study is warranted.  We will plan to repeat an echocardiogram ~3 months. If pulmonary hypertension remained, would need to consider RHC. No changes to medical therapy at this time.  Preoperative clearance Patient with upcoming left cystoscopy scheduled [facility] on 10/12/2022. Patient is certainly at higher risk of periprocedural complications given her history of hypoxia, chronic respiratory failure, and obesity. Although patient has notable physical deconditioning secondary to ICU admission at the end of May, I do not feel that she has any cardiovascular conditions that would make her unstable for this procedure or that would require evaluation prior to undergoing this procedure. Of note, April 2023 stress test found that LV perfusion was normal. There was no evidence of ischemia. There was no evidence of infarction.   Patient does continue  to have hematuria and I  do not see that labs have been checked since she returned to [place] from her May admission.  We will check a complete metabolic panel and complete blood count today.  As this is my first time seeing patient today, I will also reach out to Dr. Rennis Golden to inquire about his perspective on patient's safety for urological procedure.  If labs are stable, and Dr. Rennis Golden feels comfortable with patient proceeding, will addend this note and fax to [facility].  Per office protocol, patient can hold Eliquis for 2 days prior to procedure.             Addendum:  Patient has now completed both echocardiogram and Myoview stress test.    The study is normal. The study is low risk.   No ST deviation was noted.   Left ventricular function is normal. Nuclear stress EF: 58 %. The left ventricular ejection fraction is normal (55-65%). End diastolic cavity size is normal.   Prior study available for comparison from 08/03/2021.  Right ventricular systolic pressure on TTE 32.6 which is consistent with prior [facility] study in November 2023, down from elevated pulmonary pressure with echo during recent ICU admission. Patient remains higher risk for surgical procedure due to co-morbidities including hx hypoxemia. No modifiable risk factors at this time from cardiovascular standpoint.   The patient does not have any unstable cardiac conditions.  Upon evaluation, she can achieve 4 METs or greater without anginal symptoms.  According to [facility] and AHA guidelines, she requires no further cardiac workup prior to her noncardiac surgery and should be at acceptable risk.  Our service is available as necessary in the perioperative period.   Dispo:  No follow-ups on file.   Medication Adjustments/Labs and Tests Ordered: Current medicines are reviewed at length with the patient today.  Concerns regarding medicines are outlined above.  Tests Ordered: Orders Placed This Encounter  Procedures   CBC   Comprehensive metabolic  panel   EKG 12-Lead   Medication Changes: No orders of the defined types were placed in this encounter.  Con Memos, PA-C  10/03/2022 10:15 PM    [facility] [address] [place], [place], [place]  [address] Phone: [phone]; Fax: [phone]

## 2022-10-03 NOTE — Progress Notes (Signed)
Right shoulder pain post hospitalization  COVID Vaccine Completed:  Yes   Date of COVID positive in last 90 days: no   PCP - Delynn Flavin, DO Cardiologist - Zoila Shutter, MD Pulmonology - Levy Pupa, MD   Chest x-ray - 09-22-22 [facility] EKG - 06-05-22 Epic, 09-23-22 [facility] Stress Test - 08-03-21 Epic ECHO - 09-06-22 [facility] Cardiac Cath - n/a Pacemaker/ICD device last checked: n/a Spinal Cord Stimulator: n/a   Bowel Prep - no   Sleep Study - Ordered CPAP -   Fasting Blood Sugar - n/a Checks Blood Sugar _____ times a day   Last dose of GLP1 agonist-  N/A GLP1 instructions:  N/A   Last dose of SGLT-2 inhibitors-  N/A SGLT-2 instructions: N/A     Blood Thinner Instructions:  Eliquis, hold 3 days Aspirin Instructions: Last Dose: 10/08/22 2100   Activity level:   Can go up a flight of stairs and perform activities of daily living without stopping and without symptoms of chest pain. Sob with activity post hospitalization    Anesthesia review: Afib, HTN, CKD, hx of murmur, RBBB, pulmonary HTN   Oxygen therapy 3L at night   Patient denies shortness of breath, fever, cough and chest pain at PAT appointment   Patient verbalized understanding of instructions that were given to them at the PAT appointment. Patient was also instructed that they will need to review over the PAT instructions again at home before surgery

## 2022-10-03 NOTE — Progress Notes (Signed)
   Virtual Visit via Telephone Note   Because of Shylene Nykaza Orton's co-morbid illnesses, she is at least at moderate risk for complications without adequate follow up.  This format is felt to be most appropriate for this patient at this time.  The patient did not have access to video technology/had technical difficulties with video requiring transitioning to audio format only (telephone).  All issues noted in this document were discussed and addressed.  No physical exam could be performed with this format.  Please refer to the patient's chart for her consent to telehealth for [facility].  Evaluation Performed:  Preoperative cardiovascular risk assessment _____________   Date:  10/03/2022   Patient ID:  Rebekah [name], DOB May 15, 1955, MRN [id number] Patient Location:  Home Provider location:   Office  Primary Care Provider:  Raliegh Ip, DO Primary Cardiologist:  Chrystie Nose, MD  Chief Complaint / Patient Profile   67 y.o. y/o female with a h/o paroxysmal atrial fibrillation, hypertension, CKD, chronic hypoxic respiratory failure, obesity, and GERD who is pending Cysto; Left Ureteroscopy, stone extraction & stent exchange with Dr. Berniece Salines of [facility] and presents today for telephonic preoperative cardiovascular risk assessment.  History of Present Illness    Rebekah [name] is a 67 y.o. female who presents via audio/video conferencing for a telehealth visit today.  Pt was last seen in cardiology clinic on 07/10/2022 by Dr. Elberta Fortis.  At that time Rebekah [name] was doing well.  The patient is now pending procedure as outlined above. Since her last visit, she notes that she was hospitalized in [place] with critical illness that required ICU admission.  She has felt poorly since this time.  In this setting, patient would prefer an office visit for preoperative cardiac evaluation.  I agree that this is appropriate.  I will reach out to our preop coverage team to  have patient scheduled for an office visit either with DOD, Dr. Rennis Golden, or first available APP to address clearance prior to scheduled urological procedure (currently scheduled for 10/12/2022).  Per MyChart message sent to pt on 09/30/2022 by Dr. Rennis Golden, "Thanks Rebekah [name] - your pulmonary artery pressure was higher on Echo at [facility] than it was here last December - we may repeat the echo to follow that later this year.  Sounds like you have a clearance appt scheduled - hope the kidney stone procedure goes well.   Dr. Rennis Golden"  I will also forward this note to Dr. Rennis Golden so that he may weigh in on clearance.   Per office protocol, patient can hold Eliquis for 2 days prior to procedure.   Patient will not need bridging with Lovenox (enoxaparin) around procedure. Please resume Eliquis as soon as possible postprocedure, at the discretion of the surgeon.   Joylene Grapes, NP  10/03/2022, 10:30 AM

## 2022-10-03 NOTE — Assessment & Plan Note (Addendum)
Patient with worsened exertional dyspnea following ICU admission in [place] at the end of May 2024.  Upon further discussion though, it sounds as if this is more generalized fatigue and weakness versus acute exacerbation of just shortness of breath. I do believe that patient's shortness of breath is multifactorial.  She has a prior history of nocturnal hypoxemia, and risk factors for exertional dyspnea including obesity and orthopedic limitations of exercise.  Suspicion for sleep apnea as well.  Echocardiogram while admitted in [place] did show an increase in pulmonary artery pressure, please see below for further discussion.  While patient will certainly need repeat echocardiogram to further evaluate her RV and pulmonary artery pressure, I do not believe this needs to be done before upcoming cystoscopy.   Addendum:  As below, upon further discussion with patient, given her concern of not at all feeling like herself with exertional tolerance, will check echocardiogram and stress test.

## 2022-10-03 NOTE — Assessment & Plan Note (Addendum)
During admission for severe sepsis with shock at the end of May 2024, patient had an echocardiogram that revealed mild RV dilation with pulmonary artery pressure estimated at 48 mmHg.  This would be an increase of about 16 mmHg from prior echocardiogram with heart care in December 2023.  Pulmonary hypertension likely multifactorial with possible obstructive sleep apnea, nocturnal hypoxemia.  Patient was also found with A-fib and RVR paroxysmally during her admission and it is also possible that this precipitated some transient left sided heart changes (although LVEF reportedly normal on this echocardiogram).  Patient is also being followed by Dr. Delton Coombes with pulmonology, sincerely appreciate his care patient as well.  Agree with his most recent note that sleep study is warranted.  We will plan to repeat an echocardiogram ~3 months. If pulmonary hypertension remained, would need to consider RHC. No changes to medical therapy at this time.  Addendum: When discussing labs with patient following visit, it is clear that she is stressed about safety going into surgery given her physical decline following ICU admission. I think her concerns are valid. Given that prior echo showing worsened pulmonary hypertension was in the peri-ICU period, reasonable to reassess prior to procedure/surgery. Order placed.

## 2022-10-04 LAB — CBC
Hematocrit: 37.3 % (ref 34.0–46.6)
Hemoglobin: 11.9 g/dL (ref 11.1–15.9)
MCH: 27 pg (ref 26.6–33.0)
MCHC: 31.9 g/dL (ref 31.5–35.7)
MCV: 85 fL (ref 79–97)
Platelets: 274 10*3/uL (ref 150–450)
RBC: 4.4 x10E6/uL (ref 3.77–5.28)
RDW: 13.7 % (ref 11.7–15.4)
WBC: 7.9 10*3/uL (ref 3.4–10.8)

## 2022-10-04 LAB — COMPREHENSIVE METABOLIC PANEL
ALT: 9 IU/L (ref 0–32)
AST: 16 IU/L (ref 0–40)
Albumin: 4.5 g/dL (ref 3.9–4.9)
Alkaline Phosphatase: 75 IU/L (ref 44–121)
BUN/Creatinine Ratio: 17 (ref 12–28)
BUN: 22 mg/dL (ref 8–27)
Bilirubin Total: 0.3 mg/dL (ref 0.0–1.2)
CO2: 23 mmol/L (ref 20–29)
Calcium: 10.3 mg/dL (ref 8.7–10.3)
Chloride: 96 mmol/L (ref 96–106)
Creatinine, Ser: 1.33 mg/dL — ABNORMAL HIGH (ref 0.57–1.00)
Globulin, Total: 3.9 g/dL (ref 1.5–4.5)
Glucose: 87 mg/dL (ref 70–99)
Potassium: 5 mmol/L (ref 3.5–5.2)
Sodium: 139 mmol/L (ref 134–144)
Total Protein: 8.4 g/dL (ref 6.0–8.5)
eGFR: 44 mL/min/{1.73_m2} — ABNORMAL LOW (ref >59)

## 2022-10-04 NOTE — Progress Notes (Addendum)
Anesthesia Chart Review   Case: [id number] Date/Time: 10/12/22 1030   Procedure: CYSTOSCOPY LEFT URETEROSCOPY, HOLMIUM LASER LITHOTRIPSY, LEFT URETERAL STENT EXCHANGE (Left)   Anesthesia type: General   Pre-op diagnosis: LEFT KIDNEY CALCULI   Location: [facility] / [facility]   Surgeons: Crist Fat, MD       DISCUSSION:67 y.o. never smoker with h/o PONV, GERD, HTN, CKD, atrial fibrillation, uses O2 at bedtime, left kidney calculi scheduled for above procedure 10/12/2022 with Dr. Berniece Salines.   Pt seen by cardiology 10/03/2022. Per OV note, "Patient with upcoming left cystoscopy scheduled with [facility] on 10/12/2022. Patient is certainly at higher risk of periprocedural complications given her history of hypoxia, chronic respiratory failure, and obesity. Although patient has notable physical deconditioning secondary to ICU admission at the end of May, I do not feel that she has any cardiovascular conditions that would make her unstable for this procedure or that would require evaluation prior to undergoing this procedure. Of note, April 2023 stress test found that LV perfusion was normal. There was no evidence of ischemia. There was no evidence of infarction.    Patient does continue to have hematuria and I do not see that labs have been checked since she returned to [place] from her May admission.  We will check a complete metabolic panel and complete blood count today.  As this is my first time seeing patient today, I will also reach out to Dr. Rennis Golden to inquire about his perspective on patient's safety for urological procedure.  If labs are stable, and Dr. Rennis Golden feels comfortable with patient proceeding, will addend this note and fax to [facility].  Per office protocol, patient can hold Eliquis for 2 days prior to procedure."  Per 10/05/22 note from cardiology Lexiscan and Echo have been ordered as part of preoperative risk stratification. Pending.  VS: BP (!)  135/95   Pulse 77   Temp 36.9 C (Oral)   Resp 16   Ht 5\' 1"  (1.549 m)   Wt 118.8 kg   SpO2 96%   BMI 49.50 kg/m   PROVIDERS: Raliegh Ip, DO is PCP    LABS: Labs reviewed: Acceptable for surgery. (all labs ordered are listed, but only abnormal results are displayed)  Labs Reviewed - No data to display   IMAGES:   EKG:   CV: Echo 03/23/2022 1. Left ventricular ejection fraction, by estimation, is 60 to 65%. The  left ventricle has normal function. The left ventricle has no regional  wall motion abnormalities. There is mild concentric left ventricular  hypertrophy. Left ventricular diastolic  parameters are consistent with Grade I diastolic dysfunction (impaired  relaxation).   2. Right ventricular systolic function is normal. The right ventricular  size is normal.   3. Left atrial size was moderately dilated.   4. The mitral valve is normal in structure. No evidence of mitral valve  regurgitation.   5. The aortic valve is tricuspid. There is mild calcification of the  aortic valve. There is mild thickening of the aortic valve. Aortic valve  regurgitation is not visualized. Aortic valve sclerosis/calcification is  present, without any evidence of  aortic stenosis.   6. There is mild dilatation of the ascending aorta, measuring 40 mm.   Myocardial Perfusion 08/03/2021   The study is normal. The study is low risk.   No ST deviation was noted.   LV perfusion is normal. There is no evidence of ischemia. There is no evidence of  infarction.   Left ventricular function is normal. Nuclear stress EF: 57 %. The left ventricular ejection fraction is normal (55-65%). End diastolic cavity size is mildly enlarged.   Prior study available for comparison from 04/06/2015. No changes compared to prior study. Past Medical History:  Diagnosis Date   Allergy    Multiple   Anxiety    Arthritis    Atrial fibrillation with rapid ventricular response (HCC) 12/26/2021   Atrial  fibrillation with RVR (HCC) 12/27/2021   Bilateral bunions    Cataract    Both eyes- lenses replaced   Chronic kidney disease 2022   Not kidney disease but kidney stone   Complication of anesthesia    Depression    More anxiety-worry-stress   Difficulty sleeping    prior sleep study did not reveal sleep apnea per patient   DJD (degenerative joint disease)    Dyspnea    Dysrhythmia    a-fib   Environmental allergies    Food allergy    Gallbladder problem    GERD (gastroesophageal reflux disease)    Heart murmur    History of kidney stones    Hypertension    no meds after weight loss   Hypothyroidism    Incontinence of urine    at nite    Joint pain    Left atrial dilatation    Left foot pain    Leg edema    Obesity    s/p gastric sleeve 12/2013 (previously weighed close to 400 lbs)   Osteoarthritis    Oxygen deficiency    3L/min for sleep due to slowed reaperations during sleep   Palpitations    Paroxysmal atrial fibrillation (HCC)    Pneumonia    hx of    PONV (postoperative nausea and vomiting)    Status post bilateral knee replacements    Supplemental oxygen dependent    uses 2l/[place] at night, states HR goes low and O2 drops   Tuberculosis    had 6 month of INH due to exposure    Vaginal vault prolapse     Past Surgical History:  Procedure Laterality Date   APPENDECTOMY     CHOLECYSTECTOMY     CYSTOSCOPY/URETEROSCOPY/HOLMIUM LASER/STENT PLACEMENT Left 09/29/2020   Procedure: CYSTOSCOPY WITH LEFT RETROGRADE PYELOGRAM AND LEFT URETEROSCOPY/HOLMIUM LASER STONE REMOVAL LEFT /STENT PLACEMENT;  Surgeon: Crist Fat, MD;  Location: [facility];  Service: Urology;  Laterality: Left;   EYE SURGERY     03/2014 lens implant left lens    FOOT ARTHRODESIS Left 03/15/2016   Procedure: TALONAVICULAR AND SUBTALAR ARTHRODESIS;  Surgeon: Toni Arthurs, MD;  Location: [facility];  Service: Orthopedics;  Laterality: Left;   GASTROC RECESSION EXTREMITY Left  03/15/2016   Procedure: LEFT GASTROC RECESSION;  Surgeon: Toni Arthurs, MD;  Location: [facility];  Service: Orthopedics;  Laterality: Left;   JOINT REPLACEMENT  04/16/1997   L TOTAL KNEE   LAPAROSCOPIC GASTRIC SLEEVE RESECTION N/A 01/04/2014   Procedure: LAPAROSCOPIC GASTRIC SLEEVE RESECTION;  Surgeon: Gaynelle Adu, MD;  Location: [facility];  Service: General;  Laterality: N/A;   TONSILLECTOMY     TOTAL KNEE ARTHROPLASTY Right 05/17/2015   Procedure: RIGHT TOTAL KNEE ARTHROPLASTY;  Surgeon: Durene Romans, MD;  Location: [facility];  Service: Orthopedics;  Laterality: Right;   TRANSTHORACIC ECHOCARDIOGRAM  11/14/2012   EF 60-65%, mod LVH & mod conc hypertrophy, grade 1 diastolic dysfunction; LA mildly dilated; RV systolic pressure increased; PA peak pressure   TUBAL LIGATION  UPPER GI ENDOSCOPY  01/04/2014   Procedure: UPPER GI ENDOSCOPY;  Surgeon: Gaynelle Adu, MD;  Location: [facility];  Service: General;;    MEDICATIONS:  acetaminophen (TYLENOL) 500 MG tablet   apixaban (ELIQUIS) 5 MG TABS tablet   busPIRone (BUSPAR) 10 MG tablet   cetirizine (ZYRTEC) 10 MG tablet   diltiazem (CARDIZEM CD) 120 MG 24 hr capsule   diltiazem (CARDIZEM CD) 240 MG 24 hr capsule   diltiazem (CARDIZEM) 30 MG tablet   EPINEPHRINE 0.3 mg/0.3 mL IJ SOAJ injection   flecainide (TAMBOCOR) 50 MG tablet   furosemide (LASIX) 20 MG tablet   hydroxychloroquine (PLAQUENIL) 200 MG tablet   levothyroxine (SYNTHROID) 50 MCG tablet   OXYGEN   telmisartan (MICARDIS) 80 MG tablet   Vibegron 75 MG TABS   VITAMIN D-VITAMIN K PO   No current facility-administered medications for this encounter.    Jodell Cipro Ward, PA-C [facility] [phone]

## 2022-10-05 ENCOUNTER — Other Ambulatory Visit: Payer: Self-pay | Admitting: Cardiology

## 2022-10-05 ENCOUNTER — Telehealth: Payer: Self-pay

## 2022-10-05 DIAGNOSIS — R0609 Other forms of dyspnea: Secondary | ICD-10-CM

## 2022-10-05 DIAGNOSIS — Z01818 Encounter for other preprocedural examination: Secondary | ICD-10-CM

## 2022-10-05 DIAGNOSIS — I10 Essential (primary) hypertension: Secondary | ICD-10-CM

## 2022-10-05 NOTE — Progress Notes (Signed)
Called and personally spoke with patient regarding lab results today. We should repeat BMP in ~2 weeks. Advised patient to hold lasix for 2-3 days.

## 2022-10-05 NOTE — Telephone Encounter (Signed)
Placed ordered for echo and lexiscan. Will send instructions through mychart.

## 2022-10-05 NOTE — Telephone Encounter (Signed)
-----   Message from Perlie Gold, [place] sent at 10/05/2022 12:53 PM EDT ----- Regarding: lexiscan stress and echo I saw this patient on Wednesday for surgical clearance and after talking with her on the phone today, we're going to proceed with a lexiscan stress test (will need to be 2 day) and echocardiogram. If you could, please get her scheduled for both at first available time. I know she will need a consent order, will addend my note from Wednesday and add the order there.  Thanks!  Perlie Gold PA-C

## 2022-10-08 ENCOUNTER — Encounter (HOSPITAL_COMMUNITY): Payer: Self-pay | Admitting: Cardiology

## 2022-10-08 ENCOUNTER — Telehealth: Payer: Self-pay | Admitting: Cardiology

## 2022-10-08 ENCOUNTER — Telehealth (HOSPITAL_COMMUNITY): Payer: Self-pay

## 2022-10-08 ENCOUNTER — Encounter (HOSPITAL_COMMUNITY): Payer: Self-pay | Admitting: *Deleted

## 2022-10-08 ENCOUNTER — Ambulatory Visit (HOSPITAL_COMMUNITY): Payer: Medicare PPO | Attending: Cardiology

## 2022-10-08 DIAGNOSIS — I10 Essential (primary) hypertension: Secondary | ICD-10-CM | POA: Insufficient documentation

## 2022-10-08 DIAGNOSIS — I082 Rheumatic disorders of both aortic and tricuspid valves: Secondary | ICD-10-CM | POA: Diagnosis not present

## 2022-10-08 DIAGNOSIS — I517 Cardiomegaly: Secondary | ICD-10-CM

## 2022-10-08 DIAGNOSIS — R0609 Other forms of dyspnea: Secondary | ICD-10-CM | POA: Diagnosis not present

## 2022-10-08 DIAGNOSIS — I77810 Thoracic aortic ectasia: Secondary | ICD-10-CM | POA: Diagnosis not present

## 2022-10-08 DIAGNOSIS — Z01818 Encounter for other preprocedural examination: Secondary | ICD-10-CM | POA: Insufficient documentation

## 2022-10-08 LAB — ECHOCARDIOGRAM COMPLETE
Area-P 1/2: 2.65 cm2
P 1/2 time: 429 msec
S' Lateral: 2.6 cm

## 2022-10-08 NOTE — Telephone Encounter (Signed)
Patient given detailed instructions in person during Echo appointment per Myocardial Perfusion Study Information Sheet for the test on 6/25 at 1100. Patient notified to arrive 15 minutes early and that it is imperative to arrive on time for appointment to keep from having the test rescheduled.  If you need to cancel or reschedule your appointment, please call the office within 24 hours of your appointment. . Patient verbalized understanding. TMY

## 2022-10-08 NOTE — Telephone Encounter (Signed)
Called and spoke with patient to schedule repeat BMET as advised by Perlie Gold, PA-C:  ----- Message from Perlie Gold, PA-C sent at 10/05/2022  9:34 PM EDT ----- Called and personally spoke with patient regarding lab results today. We should repeat BMP in ~2 weeks. Advised patient to hold lasix for 2-3 days.    Patient states she held her Lasix as instructed and she states her weight has already gone up and she feels puffy. She asked if she should go back on Lasix every other day, but then states she would only be able to take it on Thursday this week.  She states she had echo today and will have her stress test this week on Tuesday and Wednesday. She is planning to move forward with surgery on Friday June 28th if echo and stress test don't show anything concerning.  Patient would like to relay this message to Clayburn Pert to see if he still wants her to come in for repeat BMET in 2 weeks. She did not want to schedule until he was made aware of the above message.  Will forward to Perlie Gold, PA-C to review and advise.

## 2022-10-08 NOTE — Telephone Encounter (Signed)
-----   Message from Rebekah Gold, PA-C sent at 10/05/2022  9:34 PM EDT ----- [name] and personally spoke with patient regarding lab results today. We should repeat BMP in ~2 weeks. Advised patient to hold lasix for 2-3 days.

## 2022-10-09 ENCOUNTER — Ambulatory Visit (HOSPITAL_COMMUNITY): Payer: Medicare PPO

## 2022-10-09 ENCOUNTER — Encounter (HOSPITAL_COMMUNITY): Payer: Self-pay

## 2022-10-09 ENCOUNTER — Ambulatory Visit: Payer: Medicare PPO | Admitting: Family Medicine

## 2022-10-09 VITALS — BP 124/73 | HR 101 | Temp 98.4°F | Ht 61.0 in | Wt 260.0 lb

## 2022-10-09 VITALS — Ht 61.0 in | Wt 264.0 lb

## 2022-10-09 DIAGNOSIS — R0609 Other forms of dyspnea: Secondary | ICD-10-CM | POA: Diagnosis not present

## 2022-10-09 DIAGNOSIS — I10 Essential (primary) hypertension: Secondary | ICD-10-CM

## 2022-10-09 DIAGNOSIS — N200 Calculus of kidney: Secondary | ICD-10-CM

## 2022-10-09 DIAGNOSIS — R531 Weakness: Secondary | ICD-10-CM | POA: Diagnosis not present

## 2022-10-09 DIAGNOSIS — Z6841 Body Mass Index (BMI) 40.0 and over, adult: Secondary | ICD-10-CM | POA: Diagnosis not present

## 2022-10-09 DIAGNOSIS — Z01818 Encounter for other preprocedural examination: Secondary | ICD-10-CM | POA: Diagnosis not present

## 2022-10-09 DIAGNOSIS — N179 Acute kidney failure, unspecified: Secondary | ICD-10-CM | POA: Diagnosis not present

## 2022-10-09 DIAGNOSIS — F419 Anxiety disorder, unspecified: Secondary | ICD-10-CM

## 2022-10-09 DIAGNOSIS — F32A Depression, unspecified: Secondary | ICD-10-CM

## 2022-10-09 LAB — MYOCARDIAL PERFUSION IMAGING
Estimated workload: 1
Peak HR: 104 {beats}/min
Percent HR: 68 %
Rest HR: 77 {beats}/min

## 2022-10-09 MED ORDER — TECHNETIUM TC 99M TETROFOSMIN IV KIT
33.0000 | PACK | Freq: Once | INTRAVENOUS | Status: AC | PRN
  Administered 2022-10-09: 33 via INTRAVENOUS

## 2022-10-09 MED ORDER — BUSPIRONE HCL 15 MG PO TABS
15.0000 mg | ORAL_TABLET | Freq: Two times a day (BID) | ORAL | 3 refills | Status: DC
Start: 2022-10-09 — End: 2022-12-31

## 2022-10-09 MED ORDER — REGADENOSON 0.4 MG/5ML IV SOLN
0.4000 mg | Freq: Once | INTRAVENOUS | Status: AC
Start: 2022-10-09 — End: 2022-10-09
  Administered 2022-10-09: 0.4 mg via INTRAVENOUS

## 2022-10-09 NOTE — Progress Notes (Signed)
Subjective: [id number]:[id number] follow up for sepsis PCP: Raliegh Ip, DO WGN:FAOZ E Rebekah [name] is a 67 y.o. female presenting to clinic today for:  1. Hospital follow up for sepsis/ renal failure Has seen pulmonology re: opacity on CT chest.  CT chest repeat planned.  Also performing sleep study and cardiology to obtain repeat echocardiogram/ stress test. She has cystoscopy scheduled 10/12/2022 likely, her echocardiogram was reassuring and did not demonstrate that the pulmonary hypertension that was noted in the hospitalization.  She has been cleared to proceed with urologic intervention Friday.  She is very interested in seeing a registered dietitian to help with oxalate safe foods.  She really is quite frightened about becoming extremely ill again as she really did not have any significant symptomology to let her know that she was in fact sick with a renal infection.  She worries that she would not survive another serious illness and this is really weight on her spirit.  Her kids think that she needs to have her buspirone increased.  She denies depressive symptoms but really states that she is quite anxious about how things went.   ROS: Per HPI  Allergies  Allergen Reactions   Beef-Derived Products Anaphylaxis    After tick bite, cannot eat beef, pork or lamb   Lambs Quarters Anaphylaxis    After tick bite cannot eat beef, pork or lamb   Mucinex [Guaifenesin Er] Anaphylaxis   Pork-Derived Products Anaphylaxis    After tick bite cannot eat beef, pork or lamb   Darvon [Propoxyphene] Other (See Comments)    Hallucinations    Ace Inhibitors Swelling and Cough    Pedal Edema   Hydrocodone Itching   Ppd [Tuberculin Purified Protein Derivative]     Always has positive testing to PPD, do not use   Hydromet [Hydrocodone Bit-Homatrop Mbr] Itching and Other (See Comments)    Severe stomach pain-face itches.    Sulfonamide Derivatives Rash   Past Medical History:  Diagnosis Date   Allergy     Multiple   Anxiety    Arthritis    Atrial fibrillation with rapid ventricular response (HCC) 12/26/2021   Atrial fibrillation with RVR (HCC) 12/27/2021   Bilateral bunions    Cataract    Both eyes- lenses replaced   Chronic kidney disease 2022   Not kidney disease but kidney stone   Complication of anesthesia    Depression    More anxiety-worry-stress   Difficulty sleeping    prior sleep study did not reveal sleep apnea per patient   DJD (degenerative joint disease)    Dyspnea    Dysrhythmia    a-fib   Environmental allergies    Food allergy    Gallbladder problem    GERD (gastroesophageal reflux disease)    Heart murmur    History of kidney stones    Hypertension    no meds after weight loss   Hypothyroidism    Incontinence of urine    at nite    Joint pain    Left atrial dilatation    Left foot pain    Leg edema    Obesity    s/p gastric sleeve 12/2013 (previously weighed close to 400 lbs)   Osteoarthritis    Oxygen deficiency    3L/min for sleep due to slowed reaperations during sleep   Palpitations    Paroxysmal atrial fibrillation (HCC)    Pneumonia    hx of    PONV (postoperative nausea and vomiting)  Status post bilateral knee replacements    Supplemental oxygen dependent    uses 2l/ at night, states HR goes low and O2 drops   Tuberculosis    had 6 month of INH due to exposure    Vaginal vault prolapse     Current Outpatient Medications:    acetaminophen (TYLENOL) 500 MG tablet, Take 1,000 mg by mouth every 6 (six) hours as needed for moderate pain or headache., Disp: , Rfl:    apixaban (ELIQUIS) 5 MG TABS tablet, TAKE ONE TABLET BY MOUTH TWICE DAILY, Disp: 180 tablet, Rfl: 1   busPIRone (BUSPAR) 10 MG tablet, Take 1 tablet (10 mg total) by mouth 2 (two) times daily., Disp: 180 tablet, Rfl: 0   cetirizine (ZYRTEC) 10 MG tablet, Take 10 mg by mouth at bedtime., Disp: , Rfl:    diltiazem (CARDIZEM CD) 120 MG 24 hr capsule, Take 1 capsule (120 mg total)  by mouth at bedtime., Disp: 90 capsule, Rfl: 3   diltiazem (CARDIZEM CD) 240 MG 24 hr capsule, Take 1 capsule (240 mg total) by mouth daily., Disp: 90 capsule, Rfl: 3   diltiazem (CARDIZEM) 30 MG tablet, TAKE ONE TO TWO TABLETS BY MOUTH EVERY 6 HOURS AS NEEDED FOR FAST HEART RATES, Disp: 30 tablet, Rfl: 9   EPINEPHRINE 0.3 mg/0.3 mL IJ SOAJ injection, inject 0.16mls into the muscle once as needed for anaphylaxis, Disp: 2 each, Rfl: 3   flecainide (TAMBOCOR) 50 MG tablet, Take 1 tablet (50 mg total) by mouth 2 (two) times daily., Disp: 180 tablet, Rfl: 3   furosemide (LASIX) 20 MG tablet, TAKE 1 TABLET BY MOUTH ONCE DAILY (Patient taking differently: Take 20 mg by mouth See admin instructions. Taking 3-4 times weekly), Disp: 90 tablet, Rfl: 3   hydroxychloroquine (PLAQUENIL) 200 MG tablet, Take 200 mg by mouth 2 (two) times daily. (Patient not taking: Reported on 10/03/2022), Disp: , Rfl:    levothyroxine (SYNTHROID) 50 MCG tablet, TAKE ONE TABLET BY MOUTH DAILY BEFORE BREAKFAST, Disp: 90 tablet, Rfl: 1   OXYGEN, Inhale 3 L/min into the lungs at bedtime. Continuously, Disp: , Rfl:    telmisartan (MICARDIS) 80 MG tablet, Take 1 tablet (80 mg total) by mouth daily., Disp: 90 tablet, Rfl: 3   Vibegron 75 MG TABS, Take 75 mg by mouth at bedtime., Disp: , Rfl:    VITAMIN D-VITAMIN K PO, Take 1 tablet by mouth daily. Takes Vitamin D3 plus K2 once daily (Patient not taking: Reported on 10/03/2022), Disp: , Rfl:  No current facility-administered medications for this visit.  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 Nothing else.  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   Social Determinants of Health   Financial Resource Strain: Low Risk  (10/09/2022)   Overall Financial Resource Strain (CARDIA)    Difficulty of Paying Living Expenses: Not very hard  Food Insecurity: No Food Insecurity (10/09/2022)   Hunger Vital Sign    Worried About Running Out of Food in the Last Year: Never true    Ran Out of Food in the Last Year: Never true  Transportation Needs: No Transportation Needs (10/09/2022)   PRAPARE - Administrator, Civil Service (Medical): No    Lack of Transportation (Non-Medical): No  Physical Activity: Inactive (10/09/2022)   Exercise Vital Sign    Days of Exercise per Week: 0 days    Minutes of Exercise per Session: 0 min  Stress: Stress Concern Present (10/09/2022)   [employer] of Occupational Health - Occupational Stress Questionnaire    Feeling of Stress : To some extent  Social Connections: Unknown (10/09/2022)   Social Connection and Isolation Panel [NHANES]    Frequency of Communication with Friends and Family: More than three times a week    Frequency of Social Gatherings with Friends and Family: Three times a week    Attends Religious Services: Patient  declined    Active Member of Clubs or Organizations: No    Attends Engineer, structural: More than 4 times per year    Marital Status: Married  Catering manager Violence: Not At Risk (09/12/2022)   Humiliation, Afraid, Rape, and Kick questionnaire    Fear of Current or Ex-Partner: No    Emotionally Abused: No    Physically Abused: No    Sexually Abused: No   Family History  Problem Relation Age of Onset   Diabetes Father    Hyperlipidemia Father    Hypertension Father    Heart disease Father    Sudden death Father    Anxiety disorder Father    Obesity Father    Varicose Veins Father    Uterine cancer Mother    Cervical cancer Mother    Breast cancer Mother    Cancer Mother        cervical and brreast cancer   Hypertension Mother    Hyperlipidemia Mother    Obesity Mother    Arthritis Mother    Diabetes Maternal Grandmother    Obesity Maternal Grandmother    Diabetes Brother    Hypertension Brother    Stroke Brother    Hypertension Brother    Obesity Daughter     Objective: Office vital signs reviewed. BP 124/73   Pulse (!) 101   Temp 98.4 F (36.9 C)   Ht 5\' 1"  (1.549 m)   Wt 260 lb (117.9 kg)   SpO2 94%   BMI 49.13 kg/m   Physical Examination:  General: Awake, alert, tired appearing, obese, No acute distress HEENT: sclera white,  MMM Cardio: regular rate and rhythm, S1S2 heard, no murmurs appreciated Pulm: Very slight crackling noted at bilateral bases of the lungs. normal work of breathing on room air MSK: slow gait.  Using walker today. Psych: somewhat tearful when talking about her recent illness/ hospitalization  10/09/2022    3:38 PM 10/09/2022    3:37 PM 09/12/2022    2:38 PM  Depression screen PHQ 2/9  Decreased Interest  0 0  Down, Depressed, Hopeless  0 0  PHQ - 2 Score  0 0  Altered sleeping 3    Tired, decreased energy 2    Change in appetite 1    Feeling bad or failure about yourself  0    Trouble concentrating 0    Moving  slowly or fidgety/restless 0    Suicidal thoughts 0    Difficult doing work/chores Not difficult at all        10/09/2022    3:38 PM 07/04/2022    9:06 AM 07/04/2022    8:20 AM 11/14/2021    8:16 AM  GAD 7 : Generalized Anxiety Score  Nervous, Anxious, on Edge 1 1 0 0  Control/stop worrying 0 0 0 0  Worry too much - different things 1 1 0 0  Trouble relaxing 1 1 0 1  Restless 1 1 0 0  Easily annoyed or irritable 1 1 0 1  Afraid - awful might happen 0 0 0 0  Total GAD 7 Score 5 5 0 2  Anxiety Difficulty Somewhat difficult Somewhat difficult Not difficult at all Not difficult at all    Assessment/ Plan: 67 y.o. female   Generalized weakness  Anxiety and depression - Plan: busPIRone (BUSPAR) 15 MG tablet  Morbid obesity with BMI of 45.0-49.9, adult (HCC) - Plan: Amb ref to Medical Nutrition Therapy-MNT  Nephrolithiasis - Plan: Amb ref to Medical Nutrition Therapy-MNT, Basic metabolic panel, CANCELED: Basic metabolic panel  AKI (acute kidney injury) (HCC) - Plan: Basic metabolic panel, CANCELED: Basic metabolic panel  Continued home physical rehabilitation encouraged.  She will be active as tolerated.  I have advanced her buspirone to 15 mg twice daily in efforts to alleviate some of the anxiety that she is experiencing, much of which is reactive given recent events.  She was quite ill and is very interested in being proactive about reducing risk of recurrent renal stones.  She requested referral to registered dietitian to talk about how to avoid excess oxalates and even maybe help with some recommendations for weight.  I have reviewed her recent notes from cardiology, pulmonology and see that she is scheduled for stone intervention with her urologist.  Will await his recommendations going forward as well  I have placed repeat BMP given AKI evident on 10/03/2022 lab draw.  Will CC this to her cardiology group.  She will be resuming her Lasix soon due to fluid gain  No orders of the  defined types were placed in this encounter.  No orders of the defined types were placed in this encounter.    Raliegh Ip, DO [facility] [phone]

## 2022-10-10 ENCOUNTER — Ambulatory Visit (HOSPITAL_COMMUNITY): Payer: Medicare PPO

## 2022-10-10 ENCOUNTER — Encounter: Payer: Self-pay | Admitting: Family Medicine

## 2022-10-10 ENCOUNTER — Ambulatory Visit (HOSPITAL_COMMUNITY): Payer: Medicare PPO | Attending: Cardiology

## 2022-10-10 ENCOUNTER — Encounter (HOSPITAL_COMMUNITY): Payer: Self-pay

## 2022-10-10 ENCOUNTER — Other Ambulatory Visit (HOSPITAL_COMMUNITY): Payer: Self-pay | Admitting: Cardiology

## 2022-10-10 DIAGNOSIS — R0609 Other forms of dyspnea: Secondary | ICD-10-CM

## 2022-10-10 DIAGNOSIS — Z01818 Encounter for other preprocedural examination: Secondary | ICD-10-CM

## 2022-10-10 LAB — MYOCARDIAL PERFUSION IMAGING
LV dias vol: 78 mL (ref 46–106)
LV sys vol: 33 mL
Nuc Stress EF: 58 %
Peak HR: 93 {beats}/min
Rest HR: 77 {beats}/min
Rest Nuclear Isotope Dose: 31.4 mCi
SDS: 2
SRS: 0
SSS: 2
ST Depression (mm): 0 mm
Stress Nuclear Isotope Dose: 30.7 mCi
TID: 0.87

## 2022-10-10 MED ORDER — TECHNETIUM TC 99M TETROFOSMIN IV KIT
31.7000 | PACK | Freq: Once | INTRAVENOUS | Status: AC | PRN
  Administered 2022-10-10: 31.7 via INTRAVENOUS

## 2022-10-10 NOTE — Anesthesia Preprocedure Evaluation (Addendum)
Anesthesia Evaluation  Patient identified by MRN, date of birth, ID band Patient awake    Reviewed: Allergy & Precautions, NPO status , Patient's Chart, lab work & pertinent test results  History of Anesthesia Complications (+) PONV and history of anesthetic complications  Airway Mallampati: II  TM Distance: >3 FB Neck ROM: Full    Dental no notable dental hx.    Pulmonary shortness of breath (2L QHS) and Long-Term Oxygen Therapy   Pulmonary exam normal        Cardiovascular hypertension, Pt. on medications + dysrhythmias Atrial Fibrillation  Rhythm:Regular Rate:Normal  ECHO 06/24:  1. Left ventricular ejection fraction, by estimation, is 65 to 70%. The  left ventricle has normal function. The left ventricle has no regional  wall motion abnormalities. There is mild left ventricular hypertrophy.  Left ventricular diastolic parameters  were normal.   2. Right ventricular systolic function is normal. The right ventricular  size is normal. There is normal pulmonary artery systolic pressure. The  estimated right ventricular systolic pressure is 32.6 mmHg.   3. The mitral valve is normal in structure. Trivial mitral valve  regurgitation. No evidence of mitral stenosis.   4. The aortic valve was not well visualized. Aortic valve regurgitation  is trivial. No aortic stenosis is present.   5. Aortic dilatation noted. There is dilatation of the ascending aorta,  measuring 42 mm.   6. The inferior vena cava is normal in size with greater than 50%  respiratory variability, suggesting right atrial pressure of 3 mmHg.     Neuro/Psych   Anxiety Depression    negative neurological ROS     GI/Hepatic Neg liver ROS,GERD  ,,  Endo/Other  Hypothyroidism    Renal/GU CRFRenal disease     Musculoskeletal  (+) Arthritis ,    Abdominal Normal abdominal exam  (+)   Peds  Hematology   Anesthesia Other Findings    Reproductive/Obstetrics                             Anesthesia Physical Anesthesia Plan  ASA: 3  Anesthesia Plan: General   Post-op Pain Management:    Induction: Intravenous  PONV Risk Score and Plan: 4 or greater and Ondansetron, Dexamethasone, Treatment may vary due to age or medical condition and Amisulpride  Airway Management Planned: Mask and LMA  Additional Equipment: None  Intra-op Plan:   Post-operative Plan: Extubation in OR  Informed Consent: I have reviewed the patients History and Physical, chart, labs and discussed the procedure including the risks, benefits and alternatives for the proposed anesthesia with the patient or authorized representative who has indicated his/her understanding and acceptance.     Dental advisory given  Plan Discussed with: CRNA  Anesthesia Plan Comments: (See PAT note 10/03/22)       Anesthesia Quick Evaluation

## 2022-10-11 ENCOUNTER — Telehealth: Payer: Self-pay | Admitting: *Deleted

## 2022-10-11 NOTE — Telephone Encounter (Signed)
-----   Message from Perlie Gold, PA-C sent at 10/10/2022  3:24 PM EDT ----- Regarding: stress test result Please let Ms. Hagey know that her stress test was normal/low risk. I'll update my note with stated clearance for her urological procedure on 6/28.  Perlie Gold, PA-C

## 2022-10-11 NOTE — Telephone Encounter (Signed)
The patient has been notified of the result and verbalized understanding.  All questions (if any) were answered. Loa Socks, LPN 1/61/0960 4:54 PM

## 2022-10-12 ENCOUNTER — Ambulatory Visit (HOSPITAL_COMMUNITY): Payer: Medicare PPO | Admitting: Physician Assistant

## 2022-10-12 ENCOUNTER — Ambulatory Visit (HOSPITAL_COMMUNITY)
Admission: RE | Admit: 2022-10-12 | Discharge: 2022-10-12 | Disposition: A | Discharge status: Home / Self Care | Payer: Medicare PPO | Source: Ambulatory Visit | Attending: Urology | Admitting: Urology

## 2022-10-12 ENCOUNTER — Encounter (HOSPITAL_COMMUNITY)
Admission: RE | Disposition: A | Discharge status: Home / Self Care | Payer: Self-pay | Source: Ambulatory Visit | Attending: Urology

## 2022-10-12 ENCOUNTER — Ambulatory Visit (HOSPITAL_BASED_OUTPATIENT_CLINIC_OR_DEPARTMENT_OTHER): Payer: Medicare PPO | Admitting: Anesthesiology

## 2022-10-12 ENCOUNTER — Other Ambulatory Visit: Payer: Self-pay

## 2022-10-12 ENCOUNTER — Encounter (HOSPITAL_COMMUNITY): Payer: Self-pay | Admitting: Urology

## 2022-10-12 ENCOUNTER — Ambulatory Visit (HOSPITAL_COMMUNITY): Payer: Medicare PPO

## 2022-10-12 DIAGNOSIS — I10 Essential (primary) hypertension: Secondary | ICD-10-CM | POA: Diagnosis not present

## 2022-10-12 DIAGNOSIS — N200 Calculus of kidney: Secondary | ICD-10-CM

## 2022-10-12 DIAGNOSIS — I4891 Unspecified atrial fibrillation: Secondary | ICD-10-CM | POA: Diagnosis not present

## 2022-10-12 DIAGNOSIS — N202 Calculus of kidney with calculus of ureter: Secondary | ICD-10-CM | POA: Diagnosis not present

## 2022-10-12 DIAGNOSIS — E039 Hypothyroidism, unspecified: Secondary | ICD-10-CM | POA: Diagnosis not present

## 2022-10-12 DIAGNOSIS — N135 Crossing vessel and stricture of ureter without hydronephrosis: Secondary | ICD-10-CM | POA: Insufficient documentation

## 2022-10-12 DIAGNOSIS — N201 Calculus of ureter: Secondary | ICD-10-CM | POA: Diagnosis not present

## 2022-10-12 HISTORY — PX: CYSTOSCOPY/URETEROSCOPY/HOLMIUM LASER/STENT PLACEMENT: SHX6546

## 2022-10-12 SURGERY — CYSTOSCOPY/URETEROSCOPY/HOLMIUM LASER/STENT PLACEMENT
Anesthesia: General | Laterality: Left

## 2022-10-12 MED ORDER — LACTATED RINGERS IV SOLN: INTRAVENOUS | Status: DC

## 2022-10-12 MED ORDER — ACETAMINOPHEN 10 MG/ML IV SOLN: 1000.0000 mg | Freq: Once | INTRAVENOUS | Status: DC | PRN

## 2022-10-12 MED ORDER — TRAMADOL HCL 50 MG PO TABS: 50.0000 mg | ORAL_TABLET | Freq: Four times a day (QID) | ORAL | 0 refills | Status: DC | PRN

## 2022-10-12 MED ORDER — ONDANSETRON HCL 4 MG/2ML IJ SOLN
INTRAMUSCULAR | Status: DC | PRN
  Administered 2022-10-12: 4 mg via INTRAVENOUS

## 2022-10-12 MED ORDER — CHLORHEXIDINE GLUCONATE 0.12 % MT SOLN
15.0000 mL | Freq: Once | OROMUCOSAL | Status: AC
  Administered 2022-10-12: 15 mL via OROMUCOSAL

## 2022-10-12 MED ORDER — PROPOFOL 10 MG/ML IV BOLUS
INTRAVENOUS | Status: DC | PRN
  Administered 2022-10-12: 200 mg via INTRAVENOUS

## 2022-10-12 MED ORDER — PROPOFOL 10 MG/ML IV BOLUS
INTRAVENOUS | Status: AC
  Filled 2022-10-12: qty 20

## 2022-10-12 MED ORDER — IOHEXOL 300 MG/ML  SOLN
INTRAMUSCULAR | Status: DC | PRN
  Administered 2022-10-12: 50 mL

## 2022-10-12 MED ORDER — FENTANYL CITRATE (PF) 100 MCG/2ML IJ SOLN
INTRAMUSCULAR | Status: AC
  Filled 2022-10-12: qty 2

## 2022-10-12 MED ORDER — EPHEDRINE 5 MG/ML INJ
INTRAVENOUS | Status: AC
  Filled 2022-10-12: qty 5

## 2022-10-12 MED ORDER — EPHEDRINE SULFATE-NACL 50-0.9 MG/10ML-% IV SOSY
PREFILLED_SYRINGE | INTRAVENOUS | Status: DC | PRN
  Administered 2022-10-12: 10 mg via INTRAVENOUS
  Administered 2022-10-12: 5 mg via INTRAVENOUS

## 2022-10-12 MED ORDER — ONDANSETRON HCL 4 MG/2ML IJ SOLN
INTRAMUSCULAR | Status: AC
  Filled 2022-10-12: qty 2

## 2022-10-12 MED ORDER — CIPROFLOXACIN IN D5W 400 MG/200ML IV SOLN
400.0000 mg | INTRAVENOUS | Status: AC
  Administered 2022-10-12: 400 mg via INTRAVENOUS
  Filled 2022-10-12: qty 200

## 2022-10-12 MED ORDER — LIDOCAINE HCL (PF) 2 % IJ SOLN
INTRAMUSCULAR | Status: AC
  Filled 2022-10-12: qty 5

## 2022-10-12 MED ORDER — FENTANYL CITRATE (PF) 100 MCG/2ML IJ SOLN
INTRAMUSCULAR | Status: DC | PRN
  Administered 2022-10-12 (×2): 25 ug via INTRAVENOUS

## 2022-10-12 MED ORDER — FENTANYL CITRATE PF 50 MCG/ML IJ SOSY: 25.0000 ug | PREFILLED_SYRINGE | INTRAMUSCULAR | Status: DC | PRN

## 2022-10-12 MED ORDER — DEXAMETHASONE SODIUM PHOSPHATE 10 MG/ML IJ SOLN
INTRAMUSCULAR | Status: AC
  Filled 2022-10-12: qty 1

## 2022-10-12 MED ORDER — CIPROFLOXACIN HCL 500 MG PO TABS: 500.0000 mg | ORAL_TABLET | Freq: Two times a day (BID) | ORAL | 0 refills | Status: AC

## 2022-10-12 MED ORDER — ORAL CARE MOUTH RINSE: 15.0000 mL | Freq: Once | OROMUCOSAL | Status: AC

## 2022-10-12 MED ORDER — DEXAMETHASONE SODIUM PHOSPHATE 10 MG/ML IJ SOLN
INTRAMUSCULAR | Status: DC | PRN
  Administered 2022-10-12: 4 mg via INTRAVENOUS

## 2022-10-12 MED ORDER — LIDOCAINE 2% (20 MG/ML) 5 ML SYRINGE
INTRAMUSCULAR | Status: DC | PRN
  Administered 2022-10-12: 60 mg via INTRAVENOUS

## 2022-10-12 SURGICAL SUPPLY — 22 items
BAG URO CATCHER STRL LF (MISCELLANEOUS) ×1 IMPLANT
BASKET ZERO TIP NITINOL 2.4FR (BASKET) IMPLANT
BSKT STON RTRVL ZERO TP 2.4FR (BASKET) ×1
CATH URETL OPEN 5X70 (CATHETERS) ×1 IMPLANT
CLOTH BEACON ORANGE TIMEOUT ST (SAFETY) ×1 IMPLANT
EXTRACTOR STONE 1.7FRX115CM (UROLOGICAL SUPPLIES) IMPLANT
GLOVE SURG LX STRL 7.5 STRW (GLOVE) ×1 IMPLANT
GOWN STRL REUS W/ TWL XL LVL3 (GOWN DISPOSABLE) ×1 IMPLANT
GOWN STRL REUS W/TWL XL LVL3 (GOWN DISPOSABLE) ×1
GUIDEWIRE ANG ZIPWIRE 038X150 (WIRE) IMPLANT
GUIDEWIRE STR DUAL SENSOR (WIRE) ×1 IMPLANT
KIT TURNOVER KIT A (KITS) IMPLANT
LASER FIB FLEXIVA PULSE ID 365 (Laser) IMPLANT
MANIFOLD NEPTUNE II (INSTRUMENTS) ×1 IMPLANT
PACK CYSTO (CUSTOM PROCEDURE TRAY) ×1 IMPLANT
SHEATH NAVIGATOR HD 12/14X28 (SHEATH) IMPLANT
SHEATH NAVIGATOR HD 12/14X36 (SHEATH) IMPLANT
STENT URET 6FRX24 CONTOUR (STENTS) IMPLANT
TRACTIP FLEXIVA PULS ID 200XHI (Laser) IMPLANT
TRACTIP FLEXIVA PULSE ID 200 (Laser) ×1
TUBING CONNECTING 10 (TUBING) ×1 IMPLANT
TUBING UROLOGY SET (TUBING) ×1 IMPLANT

## 2022-10-12 NOTE — Transfer of Care (Signed)
Immediate Anesthesia Transfer of Care Note  Patient: Rebekah [name]  Procedure(s) Performed: CYSTOSCOPY LEFT URETEROSCOPY, HOLMIUM LASER LITHOTRIPSY, LEFT URETERAL STENT EXCHANGE (Left)  Patient Location: PACU  Anesthesia Type:General  Level of Consciousness: drowsy  Airway & Oxygen Therapy: Patient Spontanous Breathing and Patient connected to face mask oxygen  Post-op Assessment: Report given to RN, Post -op Vital signs reviewed and stable, and Patient moving all extremities X 4  Post vital signs: Reviewed and stable  Last Vitals:  Vitals Value Taken Time  BP 146/88 10/12/22 1236  Temp    Pulse 73 10/12/22 1237  Resp 19 10/12/22 1237  SpO2 100 % 10/12/22 1237  Vitals shown include unvalidated device data.  Last Pain:  Vitals:   10/12/22 0843  PainSc: 0-No pain         Complications: No notable events documented.

## 2022-10-12 NOTE — Discharge Instructions (Signed)
DISCHARGE INSTRUCTIONS FOR KIDNEY STONE/URETERAL STENT   MEDICATIONS:  1. Resume all your other meds from home - except do not take any extra narcotic pain meds that you may have at home.  2. Tramadol is for moderate/severe pain, otherwise taking upto 1000 mg every 6 hours of plainTylenol will help treat your pain.   3. Take Cipro until stent is removed.  ACTIVITY:  1. No strenuous activity x 1week  2. No driving while on narcotic pain medications  3. Drink plenty of water  4. Continue to walk at home - you can still get blood clots when you are at home, so keep active, but don't over do it.  5. May return to work/school tomorrow or when you feel ready   BATHING:  1. You can shower and we recommend daily showers  2. You have a string coming from your urethra: The stent string is attached to your ureteral stent. Do not pull on this.   SIGNS/SYMPTOMS TO CALL:  Please call us if you have a fever greater than 101.5, uncontrolled nausea/vomiting, uncontrolled pain, dizziness, unable to urinate, bloody urine, chest pain, shortness of breath, leg swelling, leg pain, redness around wound, drainage from wound, or any other concerns or questions.   You can reach [name] at [phone].   FOLLOW-UP:  1. You have an appointment in 6 weeks with a ultrasound of your kidneys prior.    2. You have a string attached to your stent, you may remove it on July 1st. To do this, pull the strings until the stents are completely removed. You may feel an odd sensation in your back.

## 2022-10-12 NOTE — Anesthesia Procedure Notes (Signed)
Procedure Name: LMA Insertion Date/Time: 10/12/2022 11:20 AM  Performed by: Nelle Don, CRNAPre-anesthesia Checklist: Patient identified, Emergency Drugs available, Suction available and Patient being monitored Patient Re-evaluated:Patient Re-evaluated prior to induction Oxygen Delivery Method: Circle system utilized Preoxygenation: Pre-oxygenation with 100% oxygen Induction Type: IV induction LMA: LMA inserted LMA Size: 4.0 Number of attempts: 1 Dental Injury: Teeth and Oropharynx as per pre-operative assessment

## 2022-10-12 NOTE — Anesthesia Postprocedure Evaluation (Signed)
Anesthesia Post Note  Patient: Rebekah [name]  Procedure(s) Performed: CYSTOSCOPY LEFT URETEROSCOPY, HOLMIUM LASER LITHOTRIPSY, LEFT URETERAL STENT EXCHANGE (Left)     Patient location during evaluation: PACU Anesthesia Type: General Level of consciousness: awake and alert Pain management: pain level controlled Vital Signs Assessment: post-procedure vital signs reviewed and stable Respiratory status: spontaneous breathing, nonlabored ventilation, respiratory function stable and patient connected to nasal cannula oxygen Cardiovascular status: blood pressure returned to baseline and stable Postop Assessment: no apparent nausea or vomiting Anesthetic complications: no   No notable events documented.  Last Vitals:  Vitals:   10/12/22 1315 10/12/22 1330  BP: 139/85 136/78  Pulse: 67 63  Resp: 17 13  Temp:    SpO2: 93% 95%    Last Pain:  Vitals:   10/12/22 1330  PainSc: 0-No pain                 Earl Lites P [place]

## 2022-10-12 NOTE — H&P (Signed)
67 year old female presents today for hospital follow-up.   The patient was visiting her daughter in [place] and developed urosepsis secondary to a 9 mm stone at the left UVJ. The patient was in the hospital for quite some time on IV antibiotics with a multidrug-resistant infection. She then represented to the ER there with similar symptoms and was admitted again for another day and a half and concern for fungal infection. She has since been released and is here today for follow-up. She has been scheduled for left-sided ureteroscopy.   The patient brought with her today her CT scan which demonstrates a 9 mm stone in the left UVJ and a smaller stone in the left kidney that is nonobstructing. She has no stones on the right side.     ALLERGIES: ACE Inhibitors - Other Reaction, Swelling Beef-derived Products - Anaphylaxis, Cannot eat beef, pork, lamb after tick bite Darvon CAPS - Other Reaction Hydromet - Itching Mucinex - Anaphylaxis PPD - Swelling, Redness, Itching, Other Reaction Sulfonamide Derivatives - Itching, Skin Rash    MEDICATIONS: Gemtesa 75 mg tablet 1 tablet PO Daily  Cymbalta 60 mg capsule,delayed release  Diltiazem 12Hr Er  Diltiazem 12Hr Er 120 mg capsule, extended release 12 hr  Eliquis 5 mg tablet 1 tablet PO BID  Flecainide Acetate 100 mg tablet  Lasix 20 mg tablet  Levothyroxine Sodium 50 mcg capsule  Losartan Potassium 50 mg tablet 1 tablet PO BID  Synthroid 50 mcg tablet  Tramadol Hcl 50 mg tablet 1 tablet PO Daily PRN  Vitamin D2     GU PSH: Cystoscopy - 2022 Locm 300-399Mg /Ml Iodine,1Ml - 2022 Ureteroscopic laser litho - 2022       PSH Notes: Foot surgery (left)- 2017, Bilateral TKR ([id number])   NON-GU PSH: Appendectomy (open) Cataract surgery, Bilateral, ?2020 Gastric sleeve, 2014 Remove Gallbladder Tonsillectomy, 1960     GU PMH: Gross hematuria - 10/16/2021, - 06/14/2021, - 12/15/2020, - 11/10/2020, - 2022, - 2022, - 2022 Renal calculus -  10/16/2021, - 06/14/2021, - 2022 Nocturia - 06/14/2021, - 12/15/2020 Ureteral obstruction secondary to calculous - 12/15/2020, - 11/10/2020 Urinary Urgency - 12/15/2020      PMH Notes: Pelvic prolapse- uses pessary   NON-GU PMH: Anxiety Arthritis Atrial Fibrillation Depression Hypertension    FAMILY HISTORY: 1 Daughter - Daughter 1 son - Son Breast Cancer - Mother Cervical Cancer - Mother Death - Father, Mother Diabetes - Father Myocardial Infarction - Father stroke - Brother Uterine Cancer - Mother    Notes: Mother deceased at age 62; multiple systems shutdown  Father deceased at age 12; diabetes, heart attack   SOCIAL HISTORY: Marital Status: Married Preferred Language: English; Ethnicity: Not Hispanic Or Latino; Race: White Current Smoking Status: Patient has never smoked.   Tobacco Use Assessment Completed: Used Tobacco in last 30 days? Has never drank.  Does not drink caffeine. 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     Notes: Caffeinated drinks: 1-2 glasses a week; not daily   REVIEW OF SYSTEMS:    GU Review Female:   Patient denies frequent urination, hard to postpone urination, burning /pain with urination, get up at night to urinate, leakage of urine, stream starts and stops, trouble starting your stream, have to strain to urinate, and being pregnant.  Gastrointestinal (Upper):   Patient denies nausea, vomiting, and indigestion/ heartburn.  Gastrointestinal (Lower):   Patient denies diarrhea and constipation.  Constitutional:   Patient denies fever, night sweats, weight loss, and fatigue.  Skin:   Patient denies skin  rash/ lesion and itching.  Eyes:   Patient denies blurred vision and double vision.  Ears/ Nose/ Throat:   Patient denies sore throat and sinus problems.  Hematologic/Lymphatic:   Patient denies swollen glands and easy bruising.  Cardiovascular:   Patient denies leg swelling and chest pains.  Respiratory:   Patient denies cough and shortness of  breath.  Endocrine:   Patient denies excessive thirst.  Musculoskeletal:   Patient denies back pain and joint pain.  Neurological:   Patient denies headaches and dizziness.  Psychologic:   Patient denies depression and anxiety.   VITAL SIGNS: None   Complexity of Data:  Source Of History:  Patient  Records Review:   Previous Doctor Records, Previous Patient Records, POC Tool  Urine Test Review:   Urinalysis, Urine Culture  X-Ray Review: C.T. Abdomen/Pelvis: Reviewed Films. Discussed With Patient.     PROCEDURES:          Visit Complexity - G2211          Urinalysis w/Scope Dipstick Dipstick Cont'd Micro  Color: Red Bilirubin: Invalid mg/dL WBC/hpf: >11/BJY  Appearance: Turbid Ketones: Invalid mg/dL RBC/hpf: >78/GNF  Specific Gravity: Invalid Blood: Invalid ery/uL Bacteria: Many (>50/hpf)  pH: Invalid Protein: Invalid mg/dL Cystals: NS (Not Seen)  Glucose: Invalid mg/dL Urobilinogen: Invalid mg/dL Casts: NS (Not Seen)    Nitrites: Invalid Trichomonas: Not Present    Leukocyte Esterase: Invalid leu/uL Mucous: Not Present      Epithelial Cells: NS (Not Seen)      Yeast: NS (Not Seen)      Sperm: Not Present    Notes: too bloody to dip or spin    ASSESSMENT:      ICD-10 Details  1 GU:   Ureteral obstruction secondary to calculous - N13.2      PLAN:           Orders Labs Urine Culture          Document Letter(s):  Created for Patient: Clinical Summary         Notes:   The patient is scheduled for ureteroscopy to remove the stone in the ureter. Will simultaneously remove the stone in the left kidney. Fortunately she has no additional stones. Once the stones were removed, her stent is out, and she has a 6-week follow-up renal ultrasound demonstrating no significant abnormality we will work on a 24-hour urine collection and stone prevention strategies.

## 2022-10-12 NOTE — Op Note (Signed)
Preoperative diagnosis: left ureteral calculus  Postoperative diagnosis: left ureteral calculus  Procedure:  Cystoscopy left ureteroscopy and stone removal Ureteroscopic laser lithotripsy left 40F x 24 ureteral stent exchange left retrograde pyelography with interpretation  Surgeon: Crist Fat, MD  Anesthesia: General  Complications: None  Intraoperative findings: left retrograde pyelography demonstrated a filling defect within the left renal pelvis consistent with the patient's known calculus without other abnormalities.  EBL: Minimal  Specimens: left ureteral calculus  Indication: Rebekah [name] is a 67 y.o.   patient with a septic left ureteral stone status post stent and hospitalization.  She presents today for removal of the kidney stones that created her uroseptic scenario.  After reviewing the management options for treatment, the patient elected to proceed with the above surgical procedure(s). We have discussed the potential benefits and risks of the procedure, side effects of the proposed treatment, the likelihood of the patient achieving the goals of the procedure, and any potential problems that might occur during the procedure or recuperation. Informed consent has been obtained.   Description of procedure:  The patient was taken to the operating room and general anesthesia was induced.  The patient was placed in the dorsal lithotomy position, prepped and draped in the usual sterile fashion, and preoperative antibiotics were administered. A preoperative time-out was performed.   Cystourethroscopy was performed.  The patient's urethra was examined and was normal.  The stent was grasped from the patient's left ureteral orifice and brought to the urethral meatus.  I advanced a wire through the stent and up into the left renal pelvis under fluoroscopic guidance removing the stent over the wire.  I then advanced a semirigid ureteroscope through the patient's urethra and  into the left ureter advancing it up to the renal pelvis with no significant findings or abnormalities.  I advanced a second wire through the ureteral scope and into the left renal pelvis.  I removed the scope over the wire.  I then advanced a medium sized 12/14 French ureteral access sheath over the second wire and into the proximal ureter.  I did use the flexible ureteroscope and advanced it up through the ureteral access sheath and into the renal pelvis.  I encountered the 2 stones in the lower pole.  The smaller stone I was able to remove without laser fragmentation using a 0 tip basket.  The larger stone did require laser fragmentation.  Once the stone was noted to be in somewhat of pieces I removed all these fragments with the 0 tip basket.  I reinspected the renal pelvis as well as the ureter to ensure that there are no additional stone fragments.  I then removed the ureteral access sheath and inspected the ureter noting no significant ureteral trauma.  The wire was then backloaded through the cystoscope and a ureteral stent was advance over the wire using Seldinger technique.  The stent was positioned appropriately under fluoroscopic and cystoscopic guidance.  The wire was then removed with an adequate stent curl noted in the renal pelvis as well as in the bladder.  The bladder was then emptied and the procedure ended.  The patient appeared to tolerate the procedure well and without complications.  The patient was able to be awakened and transferred to the recovery unit in satisfactory condition.   Disposition: The tether of the stent was left on and  tucked inside the patient's vagina.  Instructions for removing the stent have been provided to the patient. The patient has been scheduled  for followup in 6 weeks with a renal ultrasound.

## 2022-10-12 NOTE — Interval H&P Note (Signed)
History and Physical Interval Note:  10/12/2022 11:06 AM  Rebekah [name]  has presented today for surgery, with the diagnosis of LEFT KIDNEY CALCULI.  The various methods of treatment have been discussed with the patient and family. After consideration of risks, benefits and other options for treatment, the patient has consented to  Procedure(s): CYSTOSCOPY LEFT URETEROSCOPY, HOLMIUM LASER LITHOTRIPSY, LEFT URETERAL STENT EXCHANGE (Left) as a surgical intervention.  The patient's history has been reviewed, patient examined, no change in status, stable for surgery.  I have reviewed the patient's chart and labs.  Questions were answered to the patient's satisfaction.     Crist Fat

## 2022-10-15 ENCOUNTER — Encounter (HOSPITAL_COMMUNITY): Payer: Self-pay | Admitting: Urology

## 2022-10-15 ENCOUNTER — Other Ambulatory Visit: Payer: Self-pay

## 2022-10-15 DIAGNOSIS — Z780 Asymptomatic menopausal state: Secondary | ICD-10-CM

## 2022-10-17 ENCOUNTER — Encounter: Payer: Self-pay | Admitting: Family Medicine

## 2022-10-19 ENCOUNTER — Other Ambulatory Visit: Payer: Self-pay

## 2022-10-19 DIAGNOSIS — R5382 Chronic fatigue, unspecified: Secondary | ICD-10-CM

## 2022-10-19 NOTE — Telephone Encounter (Signed)
Will have tyo have labs done when have follow up. Add on labs have to be added within 7 days of draw and it has been beyond that

## 2022-10-22 ENCOUNTER — Other Ambulatory Visit: Payer: Medicare PPO

## 2022-10-22 ENCOUNTER — Other Ambulatory Visit: Payer: Self-pay | Admitting: Family Medicine

## 2022-10-22 ENCOUNTER — Ambulatory Visit
Admission: RE | Admit: 2022-10-22 | Discharge: 2022-10-22 | Disposition: A | Discharge status: Home / Self Care | Payer: Medicare PPO | Source: Ambulatory Visit | Attending: Family Medicine | Admitting: Family Medicine

## 2022-10-22 DIAGNOSIS — Z1231 Encounter for screening mammogram for malignant neoplasm of breast: Secondary | ICD-10-CM

## 2022-10-22 DIAGNOSIS — N179 Acute kidney failure, unspecified: Secondary | ICD-10-CM | POA: Diagnosis not present

## 2022-10-22 DIAGNOSIS — R5382 Chronic fatigue, unspecified: Secondary | ICD-10-CM

## 2022-10-22 DIAGNOSIS — N200 Calculus of kidney: Secondary | ICD-10-CM | POA: Diagnosis not present

## 2022-10-22 LAB — BASIC METABOLIC PANEL
BUN/Creatinine Ratio: 20 (ref 12–28)
BUN: 22 mg/dL (ref 8–27)
CO2: 26 mmol/L (ref 20–29)
Calcium: 10.1 mg/dL (ref 8.7–10.3)
Chloride: 101 mmol/L (ref 96–106)
Creatinine, Ser: 1.09 mg/dL — ABNORMAL HIGH (ref 0.57–1.00)
Glucose: 98 mg/dL (ref 70–99)
Potassium: 4.8 mmol/L (ref 3.5–5.2)
Sodium: 140 mmol/L (ref 134–144)
eGFR: 55 mL/min/{1.73_m2} — ABNORMAL LOW (ref >59)

## 2022-10-22 LAB — CBC WITH DIFFERENTIAL/PLATELET
Basophils Absolute: 0.1 10*3/uL (ref 0.0–0.2)
Basos: 1 %
EOS (ABSOLUTE): 0.3 10*3/uL (ref 0.0–0.4)
Eos: 6 %
Hematocrit: 35.1 % (ref 34.0–46.6)
Hemoglobin: 10.8 g/dL — ABNORMAL LOW (ref 11.1–15.9)
Immature Grans (Abs): 0 10*3/uL (ref 0.0–0.1)
Immature Granulocytes: 0 %
Lymphocytes Absolute: 1.9 10*3/uL (ref 0.7–3.1)
Lymphs: 36 %
MCH: 26.7 pg (ref 26.6–33.0)
MCHC: 30.8 g/dL — ABNORMAL LOW (ref 31.5–35.7)
MCV: 87 fL (ref 79–97)
Monocytes Absolute: 0.5 10*3/uL (ref 0.1–0.9)
Monocytes: 9 %
Neutrophils Absolute: 2.5 10*3/uL (ref 1.4–7.0)
Neutrophils: 48 %
Platelets: 244 10*3/uL (ref 150–450)
RBC: 4.05 x10E6/uL (ref 3.77–5.28)
RDW: 13.7 % (ref 11.7–15.4)
WBC: 5.2 10*3/uL (ref 3.4–10.8)

## 2022-10-23 ENCOUNTER — Encounter: Payer: Self-pay | Admitting: Family Medicine

## 2022-10-23 DIAGNOSIS — N179 Acute kidney failure, unspecified: Secondary | ICD-10-CM

## 2022-10-23 NOTE — Addendum Note (Signed)
Addended by: Raliegh Ip on: 10/23/2022 11:00 AM   Modules accepted: Orders

## 2022-10-25 ENCOUNTER — Other Ambulatory Visit: Payer: Self-pay | Admitting: Family Medicine

## 2022-10-25 ENCOUNTER — Encounter: Payer: Self-pay | Admitting: Family Medicine

## 2022-10-25 DIAGNOSIS — R928 Other abnormal and inconclusive findings on diagnostic imaging of breast: Secondary | ICD-10-CM

## 2022-10-26 ENCOUNTER — Encounter: Payer: Self-pay | Admitting: Family

## 2022-10-26 ENCOUNTER — Ambulatory Visit: Payer: Medicare PPO | Admitting: Family

## 2022-10-26 ENCOUNTER — Ambulatory Visit (INDEPENDENT_AMBULATORY_CARE_PROVIDER_SITE_OTHER): Payer: Medicare PPO

## 2022-10-26 VITALS — BP 127/80 | HR 77 | Temp 98.1°F | Ht 61.0 in | Wt 265.4 lb

## 2022-10-26 DIAGNOSIS — M19011 Primary osteoarthritis, right shoulder: Secondary | ICD-10-CM

## 2022-10-26 DIAGNOSIS — M25511 Pain in right shoulder: Secondary | ICD-10-CM

## 2022-10-26 DIAGNOSIS — G8929 Other chronic pain: Secondary | ICD-10-CM | POA: Diagnosis not present

## 2022-10-26 MED ORDER — METHYLPREDNISOLONE ACETATE 40 MG/ML IJ SUSP
40.0000 mg | Freq: Once | INTRAMUSCULAR | Status: AC
Start: 2022-10-26 — End: 2022-10-26
  Administered 2022-10-26: 40 mg via INTRAMUSCULAR

## 2022-10-26 MED ORDER — BUPIVACAINE HCL 0.25 % IJ SOLN
1.0000 mL | Freq: Once | INTRAMUSCULAR | Status: AC
Start: 2022-10-26 — End: 2022-10-26
  Administered 2022-10-26: 1 mL via INTRA_ARTICULAR

## 2022-10-26 NOTE — Progress Notes (Addendum)
Subjective:    Patient ID: Rebekah [name], female    DOB: [birth year], 67 y.o.   MRN: [id number]  Chief Complaint  Patient presents with   Shoulder Pain   Pt presents to the office today with right shoulder pain. She has hx of arthritis.   She has used lidocaine patches, tylenol, and ultram with mild relief. She can not take NSAID"s because she is taking Eliquis.  Shoulder Pain  The pain is present in the right shoulder. This is a recurrent problem. The current episode started more than 1 month ago. The problem occurs constantly. The problem has been gradually worsening. The quality of the pain is described as aching. The pain is at a severity of 9/10. The pain is moderate. Associated symptoms include a limited range of motion, numbness and stiffness. Pertinent negatives include no joint locking or joint swelling. She has tried OTC pain meds, rest, OTC ointments and acetaminophen for the symptoms. The treatment provided mild relief.      Review of Systems  Musculoskeletal:  Positive for stiffness.  Neurological:  Positive for numbness.  All other systems reviewed and are negative.      Objective:   Physical Exam Vitals reviewed.  Constitutional:      General: She is not in acute distress.    Appearance: She is well-developed. She is obese.  HENT:     Head: Normocephalic and atraumatic.  Eyes:     Pupils: Pupils are equal, round, and reactive to light.  Neck:     Thyroid: No thyromegaly.  Cardiovascular:     Rate and Rhythm: Normal rate and regular rhythm.     Heart sounds: Normal heart sounds. No murmur heard. Pulmonary:     Effort: Pulmonary effort is normal. No respiratory distress.     Breath sounds: Normal breath sounds. No wheezing.  Abdominal:     General: Bowel sounds are normal. There is no distension.     Palpations: Abdomen is soft.     Tenderness: There is no abdominal tenderness.  Musculoskeletal:        General: No tenderness.     Cervical back: Normal  range of motion and neck supple.     Comments: Right shoulder pain with abduction and internal rotation  Skin:    General: Skin is warm and dry.  Neurological:     Mental Status: She is alert and oriented to person, place, and time.     Cranial Nerves: No cranial nerve deficit.     Deep Tendon Reflexes: Reflexes are normal and symmetric.  Psychiatric:        Behavior: Behavior normal.        Thought Content: Thought content normal.        Judgment: Judgment normal.    rightshoulder prepped with betadine Injected with Marcaine .25% plain and methylprednisolone with 22 guage needle x 1. Patient tolerated well.   Blood pressure 127/80, pulse 77, temperature 98.1 F (36.7 C), temperature source Temporal, height 5\' 1"  (1.549 m), weight 265 lb 6.4 oz (120.4 kg), SpO2 96%.      Assessment & Plan:  Rebekah [name] comes in today with chief complaint of Shoulder Pain   Diagnosis and orders addressed:  1. Chronic right shoulder pain - DG Shoulder Right - bupivacaine (MARCAINE) 0.25 % (with pres) injection 1 mL - methylPREDNISolone acetate (DEPO-MEDROL) injection 40 mg  2. Primary osteoarthritis of right shoulder - DG Shoulder Right - bupivacaine (MARCAINE) 0.25 % (with pres) injection 1  mL - methylPREDNISolone acetate (DEPO-MEDROL) injection 40 mg  Does not want Ortho referral at this time Ice Tylenol as needed Keep follow up with PCP    Jannifer Rodney, FNP

## 2022-10-30 ENCOUNTER — Telehealth (HOSPITAL_BASED_OUTPATIENT_CLINIC_OR_DEPARTMENT_OTHER): Payer: Self-pay | Admitting: Emergency Medicine

## 2022-10-30 NOTE — Telephone Encounter (Signed)
Patient states has lots of questions that [facility] cannot answer after patient contacted them. Patient states she does not sleep well at night and may get a few hours of sleep each night WITH her 3 liters of oxygen. Patient is also wanting to know if she should or should not wear hear oxygen during the HST. Please advise and call patient back.

## 2022-11-01 ENCOUNTER — Ambulatory Visit
Admission: RE | Admit: 2022-11-01 | Discharge: 2022-11-01 | Disposition: A | Discharge status: Home / Self Care | Payer: Medicare PPO | Source: Ambulatory Visit | Attending: Family Medicine | Admitting: Family Medicine

## 2022-11-01 ENCOUNTER — Ambulatory Visit: Payer: Medicare PPO

## 2022-11-01 DIAGNOSIS — R928 Other abnormal and inconclusive findings on diagnostic imaging of breast: Secondary | ICD-10-CM | POA: Diagnosis not present

## 2022-11-01 NOTE — Telephone Encounter (Signed)
Patient calling again for an update. Please advise and call patient back.

## 2022-11-05 NOTE — Telephone Encounter (Signed)
ATC the patient. LVM for the patient to return call.

## 2022-11-06 NOTE — Telephone Encounter (Signed)
Pt called in stated she missed a call from [place], she states her questions were answered but will like a courtesy call back just incase there is a any thing else.

## 2022-11-07 DIAGNOSIS — G4730 Sleep apnea, unspecified: Secondary | ICD-10-CM | POA: Diagnosis not present

## 2022-11-08 DIAGNOSIS — M2011 Hallux valgus (acquired), right foot: Secondary | ICD-10-CM | POA: Diagnosis not present

## 2022-11-08 DIAGNOSIS — S9031XA Contusion of right foot, initial encounter: Secondary | ICD-10-CM | POA: Diagnosis not present

## 2022-11-08 DIAGNOSIS — R52 Pain, unspecified: Secondary | ICD-10-CM | POA: Diagnosis not present

## 2022-11-08 DIAGNOSIS — S92351D Displaced fracture of fifth metatarsal bone, right foot, subsequent encounter for fracture with routine healing: Secondary | ICD-10-CM | POA: Diagnosis not present

## 2022-11-15 ENCOUNTER — Encounter: Payer: Self-pay | Admitting: Family Medicine

## 2022-11-16 ENCOUNTER — Encounter: Payer: Self-pay | Admitting: Family Medicine

## 2022-11-16 ENCOUNTER — Telehealth: Payer: Medicare PPO | Admitting: Family Medicine

## 2022-11-16 DIAGNOSIS — R4589 Other symptoms and signs involving emotional state: Secondary | ICD-10-CM

## 2022-11-16 MED ORDER — DESVENLAFAXINE SUCCINATE ER 50 MG PO TB24
50.0000 mg | ORAL_TABLET | Freq: Every day | ORAL | 0 refills | Status: AC
Start: 2022-11-16 — End: ?

## 2022-11-16 NOTE — Telephone Encounter (Signed)
I can do a virtual visit with her during lunch if she wants.  Think I have 3 open

## 2022-11-16 NOTE — Progress Notes (Signed)
MyChart Video visit  Subjective: CC:GAD PCP: Raliegh Ip, DO GNF:AOZH E Ciesla is a 67 y.o. female. Patient provides verbal consent for consult held via video.  Due to COVID-19 pandemic this visit was conducted virtually. This visit type was conducted due to national recommendations for restrictions regarding the COVID-19 Pandemic (e.g. social distancing, sheltering in place) in an effort to limit this patient's exposure and mitigate transmission in our community. All issues noted in this document were discussed and addressed.  A physical exam was not performed with this format.   Location of patient: [place] at a friend's house Location of provider: [facility] Others present for call: none  1. Anxiety about health Reports anxiety about health after having a hospitalization for sepsis this year.  She reports being more easily tearful about things that never caused her to cry before.  She was previously treated with Lexapro/ Celexa and while it was helpful initially, ultimately she didn't find it controlled her symptoms well.  Her daughter, who is a geriatrician, recommended considering Zoloft.  Patient wonders my opinion.  She is taking Buspar 15mg  TID currently.   ROS: Per HPI  Allergies  Allergen Reactions   Beef-Derived Products Anaphylaxis    After tick bite, cannot eat beef, pork or lamb   Lambs Quarters Anaphylaxis    After tick bite cannot eat beef, pork or lamb   Mucinex [Guaifenesin Er] Anaphylaxis   Pork-Derived Products Anaphylaxis    After tick bite cannot eat beef, pork or lamb   Darvon [Propoxyphene] Other (See Comments)    Hallucinations    Ace Inhibitors Swelling and Cough    Pedal Edema   Hydrocodone Itching   Ppd [Tuberculin Purified Protein Derivative]     Always has positive testing to PPD, do not use   Hydromet [Hydrocodone Bit-Homatrop Mbr] Itching and Other (See Comments)    Severe stomach pain-face itches.    Sulfonamide Derivatives Rash   Past  Medical History:  Diagnosis Date   Allergy    Multiple   Anxiety    Arthritis    Atrial fibrillation with rapid ventricular response (HCC) 12/26/2021   Atrial fibrillation with RVR (HCC) 12/27/2021   Bilateral bunions    Cataract    Both eyes- lenses replaced   Chronic kidney disease 2022   Not kidney disease but kidney stone   Complication of anesthesia    Depression    More anxiety-worry-stress   Difficulty sleeping    prior sleep study did not reveal sleep apnea per patient   DJD (degenerative joint disease)    Dyspnea    Dysrhythmia    a-fib   Environmental allergies    Food allergy    Gallbladder problem    GERD (gastroesophageal reflux disease)    Heart murmur    History of kidney stones    Hypertension    no meds after weight loss   Hypothyroidism    Incontinence of urine    at nite    Joint pain    Left atrial dilatation    Left foot pain    Leg edema    Obesity    s/p gastric sleeve 12/2013 (previously weighed close to 400 lbs)   Osteoarthritis    Oxygen deficiency    3L/min for sleep due to slowed reaperations during sleep   Palpitations    Paroxysmal atrial fibrillation (HCC)    Pneumonia    hx of    PONV (postoperative nausea and vomiting)    Status post  bilateral knee replacements    Supplemental oxygen dependent    uses 2l/[place] at night, states HR goes low and O2 drops   Tuberculosis    had 6 month of INH due to exposure    Vaginal vault prolapse     Current Outpatient Medications:    acetaminophen (TYLENOL) 500 MG tablet, Take 1,000 mg by mouth every 6 (six) hours as needed for moderate pain or headache., Disp: , Rfl:    apixaban (ELIQUIS) 5 MG TABS tablet, TAKE ONE TABLET BY MOUTH TWICE DAILY, Disp: 180 tablet, Rfl: 1   busPIRone (BUSPAR) 15 MG tablet, Take 1 tablet (15 mg total) by mouth 2 (two) times daily., Disp: 180 tablet, Rfl: 3   cetirizine (ZYRTEC) 10 MG tablet, Take 10 mg by mouth at bedtime., Disp: , Rfl:    diltiazem (CARDIZEM CD)  120 MG 24 hr capsule, Take 1 capsule (120 mg total) by mouth at bedtime., Disp: 90 capsule, Rfl: 3   diltiazem (CARDIZEM CD) 240 MG 24 hr capsule, Take 1 capsule (240 mg total) by mouth daily., Disp: 90 capsule, Rfl: 3   diltiazem (CARDIZEM) 30 MG tablet, TAKE ONE TO TWO TABLETS BY MOUTH EVERY 6 HOURS AS NEEDED FOR FAST HEART RATES, Disp: 30 tablet, Rfl: 9   EPINEPHRINE 0.3 mg/0.3 mL IJ SOAJ injection, inject 0.26mls into the muscle once as needed for anaphylaxis, Disp: 2 each, Rfl: 3   flecainide (TAMBOCOR) 50 MG tablet, Take 1 tablet (50 mg total) by mouth 2 (two) times daily., Disp: 180 tablet, Rfl: 3   furosemide (LASIX) 20 MG tablet, TAKE 1 TABLET BY MOUTH ONCE DAILY (Patient taking differently: Take 20 mg by mouth See admin instructions. Taking 3-4 times weekly), Disp: 90 tablet, Rfl: 3   hydroxychloroquine (PLAQUENIL) 200 MG tablet, Take 200 mg by mouth 2 (two) times daily., Disp: , Rfl:    levothyroxine (SYNTHROID) 50 MCG tablet, TAKE ONE TABLET BY MOUTH DAILY BEFORE BREAKFAST, Disp: 90 tablet, Rfl: 1   OXYGEN, Inhale 3 L/min into the lungs at bedtime. Continuously, Disp: , Rfl:    telmisartan (MICARDIS) 80 MG tablet, Take 1 tablet (80 mg total) by mouth daily., Disp: 90 tablet, Rfl: 3   traMADol (ULTRAM) 50 MG tablet, Take 1-2 tablets (50-100 mg total) by mouth every 6 (six) hours as needed for moderate pain., Disp: 15 tablet, Rfl: 0  Gen: intermittently tearful Psych: pleasant, interactive, good insight, good eye contact, tearful intermittently  Assessment/ Plan: 67 y.o. female   Anxiety about health - Plan: Ambulatory referral to [facility], desvenlafaxine (PRISTIQ) 50 MG 24 hr tablet  Reactive after life threatening event. Advance Buspar to 1 &1/3 tablets 3 times daily.  If helpful, she will let me know and I can change to 20mg  TID.  Pristiq started.  We Discussed risks vs benefits of SSRI given history with Lexapro, struggle with morbid obesity and current Afib.   Referral to behavioural health for counseling in office also placed.  She has 7 week follow up scheduled with me.  Patient and/or legal guardian verbally consented to [facility] services about presenting concerns and psychiatric consultation as appropriate.  The services will be billed as appropriate for the patient  Start time: 10:41a End time: 11:02  Total time spent on patient care (including video visit/ documentation): 25 minutes   Hulen Skains, DO [facility] [phone]

## 2022-11-20 ENCOUNTER — Encounter (INDEPENDENT_AMBULATORY_CARE_PROVIDER_SITE_OTHER): Payer: Medicare PPO

## 2022-11-20 ENCOUNTER — Other Ambulatory Visit: Payer: Medicare PPO

## 2022-11-20 DIAGNOSIS — R0683 Snoring: Secondary | ICD-10-CM

## 2022-11-20 DIAGNOSIS — G4733 Obstructive sleep apnea (adult) (pediatric): Secondary | ICD-10-CM

## 2022-11-20 DIAGNOSIS — N179 Acute kidney failure, unspecified: Secondary | ICD-10-CM | POA: Diagnosis not present

## 2022-11-20 LAB — CBC
Hematocrit: 38.3 % (ref 34.0–46.6)
Hemoglobin: 11.9 g/dL (ref 11.1–15.9)
MCH: 26.7 pg (ref 26.6–33.0)
MCHC: 31.1 g/dL — ABNORMAL LOW (ref 31.5–35.7)
MCV: 86 fL (ref 79–97)
Platelets: 218 10*3/uL (ref 150–450)
RBC: 4.46 x10E6/uL (ref 3.77–5.28)
RDW: 14.6 % (ref 11.7–15.4)
WBC: 5.9 10*3/uL (ref 3.4–10.8)

## 2022-11-20 LAB — BASIC METABOLIC PANEL
BUN/Creatinine Ratio: 18 (ref 12–28)
BUN: 20 mg/dL (ref 8–27)
CO2: 25 mmol/L (ref 20–29)
Calcium: 10.1 mg/dL (ref 8.7–10.3)
Chloride: 99 mmol/L (ref 96–106)
Creatinine, Ser: 1.12 mg/dL — ABNORMAL HIGH (ref 0.57–1.00)
Glucose: 102 mg/dL — ABNORMAL HIGH (ref 70–99)
Potassium: 4.9 mmol/L (ref 3.5–5.2)
Sodium: 141 mmol/L (ref 134–144)
eGFR: 54 mL/min/{1.73_m2} — ABNORMAL LOW (ref >59)

## 2022-11-23 DIAGNOSIS — N132 Hydronephrosis with renal and ureteral calculous obstruction: Secondary | ICD-10-CM | POA: Diagnosis not present

## 2022-11-23 DIAGNOSIS — R8271 Bacteriuria: Secondary | ICD-10-CM | POA: Diagnosis not present

## 2022-11-23 DIAGNOSIS — N3020 Other chronic cystitis without hematuria: Secondary | ICD-10-CM | POA: Diagnosis not present

## 2022-11-27 ENCOUNTER — Encounter: Payer: Self-pay | Admitting: Family Medicine

## 2022-11-27 ENCOUNTER — Other Ambulatory Visit: Payer: Self-pay | Admitting: Family Medicine

## 2022-11-27 DIAGNOSIS — E034 Atrophy of thyroid (acquired): Secondary | ICD-10-CM

## 2022-11-29 ENCOUNTER — Other Ambulatory Visit: Payer: Self-pay | Admitting: Internal Medicine

## 2022-11-29 ENCOUNTER — Other Ambulatory Visit (HOSPITAL_COMMUNITY): Payer: Self-pay | Admitting: Adult Health

## 2022-11-29 DIAGNOSIS — N132 Hydronephrosis with renal and ureteral calculous obstruction: Secondary | ICD-10-CM

## 2022-12-03 ENCOUNTER — Ambulatory Visit (HOSPITAL_COMMUNITY)
Admission: RE | Admit: 2022-12-03 | Discharge: 2022-12-03 | Disposition: A | Discharge status: Home / Self Care | Payer: Medicare PPO | Source: Ambulatory Visit | Attending: Emergency Medicine | Admitting: Emergency Medicine

## 2022-12-03 DIAGNOSIS — K4020 Bilateral inguinal hernia, without obstruction or gangrene, not specified as recurrent: Secondary | ICD-10-CM | POA: Diagnosis not present

## 2022-12-03 DIAGNOSIS — N132 Hydronephrosis with renal and ureteral calculous obstruction: Secondary | ICD-10-CM | POA: Diagnosis not present

## 2022-12-03 DIAGNOSIS — R918 Other nonspecific abnormal finding of lung field: Secondary | ICD-10-CM | POA: Diagnosis not present

## 2022-12-03 DIAGNOSIS — R932 Abnormal findings on diagnostic imaging of liver and biliary tract: Secondary | ICD-10-CM | POA: Diagnosis not present

## 2022-12-05 ENCOUNTER — Encounter: Payer: Self-pay | Admitting: Family Medicine

## 2022-12-07 ENCOUNTER — Ambulatory Visit: Payer: Medicare PPO | Admitting: Emergency Medicine

## 2022-12-07 ENCOUNTER — Encounter: Payer: Self-pay | Admitting: Emergency Medicine

## 2022-12-07 VITALS — BP 140/70 | HR 65 | Ht 61.0 in | Wt 271.6 lb

## 2022-12-07 DIAGNOSIS — R9389 Abnormal findings on diagnostic imaging of other specified body structures: Secondary | ICD-10-CM

## 2022-12-07 DIAGNOSIS — G4733 Obstructive sleep apnea (adult) (pediatric): Secondary | ICD-10-CM

## 2022-12-07 HISTORY — DX: Obstructive sleep apnea (adult) (pediatric): G47.33

## 2022-12-07 NOTE — Addendum Note (Signed)
Addended by: Glynda Jaeger on: 12/07/2022 10:11 AM   Modules accepted: Orders

## 2022-12-07 NOTE — Patient Instructions (Signed)
We reviewed your CT scan of the chest today.  We will not plan a dedicated follow-up at this time.  We will wait for the official radiology interpretation to ensure we do not need to schedule a follow-up scan. We reviewed your sleep study today. We will work on starting CPAP treatment, establish the appropriate mask for you. Follow with Dr. Delton Coombes or APP in 3 months so we can assess your progress on the CPAP.

## 2022-12-07 NOTE — Assessment & Plan Note (Signed)
OSA confirmed on her home PSG.  She is willing to try CPAP.  We will ask DME to do a best mask fit assessment, start her on CPAP with a range of 8-20 cmH2O, heated humidity.  Follow-up in 3 months to assess her progress on CPAP.

## 2022-12-07 NOTE — Progress Notes (Signed)
Subjective:    Patient ID: Rebekah [name], female    DOB: Oct 03, 1955, 67 y.o.   MRN: [id number]  HPI 67 year old retired Engineer, civil (consulting), never smoker, with a history of atrial fibrillation on Eliquis, inflammatory arthritis on hydroxychloroquine, hypothyroidism, renal calculi with history of ureteral stent placement the past, GERD, hypertension, obesity with a gastric sleeve in 2015, latent TB (treated for 6 months with INH), nocturnal hypoxemia. I have seen her in the remote past for OSA/OHS, COP following hospitalization 2008.   She was admitted to [facility] in [place] in mid May for E. coli and Klebsiella bacteremia, presumed urinary source, septic shock.  She required intubation mechanical ventilation during that hospitalization.  There was some presumption that she had obstructive sleep apnea and she used CPAP during the hospitalization as well postextubation.  An echocardiogram showed RV dilation and evaluation consistent with secondary PAH A CT-PA was done during the hospitalization on 09/07/2022 as below She is doing ok since she got discharged but she is still weak. She is sleeping with 3L/min. No cough except w PND.   CT-PA 09/07/2022 [facility]) showed no evidence of PE, mild enlargement of the RV and a dilated main PA 3.8 cm.  There were some scattered patchy groundglass opacities in the right apex, scattered solid and semisolid pulmonary nodules largest 8 mm in the anterior left upper lobe. 4 mm apical right upper lobe x2 , 7 mm apical right upper lobe, mixed groundglass left upper lobe 8 mm, 6 mm solid left upper lobe anterior, 5 mm groundglass right lower lobe lateral basal   ROV 12/07/2022 --follow-up visit for 67 year old woman whom I saw in June for an abnormal CT scan of the chest that shows some scattered patchy groundglass opacities at the right apex, scattered solid and semisolid nodules including 8 mm mixed density nodule in the anterior left upper lobe, 4 mm  apical upper lobe nodules, 7 mm right upper lobe nodule.  She has a history of latent TB that was treated.  Her CT also showed enlarged RV and a dilated main PA suggestive of possible pulmonary hypertension.  She has A-fib, hypertension and diastolic dysfunction.  Based on this we decided to perform a PSG.  She tells me that she doesn't love the idea of doing CPAP, but is considering it. Overall she is feeling better, stronger. She gets exertional SOB.   Home sleep study shows moderate OSA with an AHI of 19.7.   Super D CT chest 12/03/2022 reviewed by me shows possible anterior left upper lobe nodule.  The other nodules that were mentioned on her prior CT are difficult to see, certainly do not note any evidence of progression.  They would appear to be resolved.   Review of Systems As per HPI  Past Medical History:  Diagnosis Date   Allergy    Multiple   Anxiety    Arthritis    Atrial fibrillation with rapid ventricular response (HCC) 12/26/2021   Atrial fibrillation with RVR (HCC) 12/27/2021   Bilateral bunions    Cataract    Both eyes- lenses replaced   Chronic kidney disease 2022   Not kidney disease but kidney stone   Complication of anesthesia    Depression    More anxiety-worry-stress   Difficulty sleeping    prior sleep study did not reveal sleep apnea per patient   DJD (degenerative joint disease)    Dyspnea    Dysrhythmia    a-fib   Environmental allergies  Food allergy    Gallbladder problem    GERD (gastroesophageal reflux disease)    Heart murmur    History of kidney stones    Hypertension    no meds after weight loss   Hypothyroidism    Incontinence of urine    at nite    Joint pain    Left atrial dilatation    Left foot pain    Leg edema    Obesity    s/p gastric sleeve 12/2013 (previously weighed close to 400 lbs)   Obstructive sleep apnea 12/07/2022   Osteoarthritis    Oxygen deficiency    3L/min for sleep due to slowed reaperations during sleep    Palpitations    Paroxysmal atrial fibrillation (HCC)    Pneumonia    hx of    PONV (postoperative nausea and vomiting)    Status post bilateral knee replacements    Supplemental oxygen dependent    uses 2l/[place] at night, states HR goes low and O2 drops   Tuberculosis    had 6 month of INH due to exposure    Vaginal vault prolapse      Family History  Problem Relation Age of Onset   Diabetes Father    Hyperlipidemia Father    Hypertension Father    Heart disease Father    Sudden death Father    Anxiety disorder Father    Obesity Father    Varicose Veins Father    Uterine cancer Mother    Cervical cancer Mother    Breast cancer Mother    Cancer Mother        cervical and brreast cancer   Hypertension Mother    Hyperlipidemia Mother    Obesity Mother    Arthritis Mother    Diabetes Maternal Grandmother    Obesity Maternal Grandmother    Diabetes Brother    Hypertension Brother    Stroke Brother    Hypertension Brother    Obesity Daughter      Social History   Socioeconomic History   Marital status: Married    Spouse name: Saliyah Afolabi   Number of children: 2   Years of education: 16   Highest education level: Bachelor's degree (e.g., BA, AB, BS)  Occupational History   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 Nothing else.  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    Social Determinants of Health   Financial Resource Strain: Low Risk  (10/09/2022)   Overall Financial Resource Strain (CARDIA)    Difficulty of Paying Living Expenses: Not very hard  Food Insecurity: No Food Insecurity (10/09/2022)   Hunger Vital Sign    Worried About Running Out of Food in the Last Year: Never true    Ran Out of Food in the Last Year:  Never true  Transportation Needs: No Transportation Needs (10/09/2022)   PRAPARE - Administrator, Civil Service (Medical): No    Lack of Transportation (Non-Medical): No  Physical Activity: Inactive (10/09/2022)   Exercise Vital Sign    Days of Exercise per Week: 0 days    Minutes of Exercise per Session: 0 min  Stress: Stress Concern Present (10/09/2022)   [employer] of Occupational Health - Occupational Stress Questionnaire    Feeling of Stress : To some extent  Social Connections: Unknown (10/09/2022)   Social Connection and Isolation Panel [NHANES]    Frequency of Communication with Friends and Family: More than three times a week    Frequency of Social Gatherings with Friends and Family: Three times a week    Attends Religious Services: Patient declined    Active Member of Clubs or Organizations: No    Attends Engineer, structural: More than 4 times per year    Marital Status: Married  Catering manager Violence: Not At Risk (09/12/2022)   Humiliation, Afraid, Rape, and Kick questionnaire    Fear of Current or Ex-Partner: No    Emotionally Abused: No    Physically Abused: No    Sexually Abused: No     Allergies  Allergen Reactions   Beef-Derived Products Anaphylaxis    After tick bite, cannot eat beef, pork or lamb   Lambs Quarters Anaphylaxis    After tick bite cannot eat beef, pork or lamb   Mucinex [Guaifenesin Er] Anaphylaxis   Pork-Derived Products Anaphylaxis    After tick bite cannot eat beef, pork or lamb   Darvon [Propoxyphene] Other (See Comments)    Hallucinations    Ace Inhibitors  Swelling and Cough    Pedal Edema   Hydrocodone Itching   Ppd [Tuberculin Purified Protein Derivative]     Always has positive testing to PPD, do not use   Hydromet [Hydrocodone Bit-Homatrop Mbr] Itching and Other (See Comments)    Severe stomach pain-face itches.    Sulfonamide Derivatives Rash     Outpatient Medications Prior to Visit  Medication Sig Dispense Refill   acetaminophen (TYLENOL) 500 MG tablet Take 1,000 mg by mouth every 6 (six) hours as needed for moderate pain or headache.     apixaban (ELIQUIS) 5 MG TABS tablet TAKE ONE TABLET BY MOUTH TWICE DAILY 180 tablet 1   busPIRone (BUSPAR) 15 MG tablet Take 1 tablet (15 mg total) by mouth 2 (two) times daily. 180 tablet 3   cetirizine (ZYRTEC) 10 MG tablet Take 10 mg by mouth at bedtime.     desvenlafaxine (PRISTIQ) 50 MG 24 hr tablet Take 1 tablet (50 mg total) by mouth daily. 90 tablet 0   diltiazem (CARDIZEM CD) 120 MG 24 hr capsule Take 1 capsule (120 mg total) by mouth at bedtime. 90 capsule 3   diltiazem (CARDIZEM CD) 240 MG 24 hr capsule Take 1 capsule (240 mg total) by mouth daily. 90 capsule 3   diltiazem (CARDIZEM) 30 MG tablet TAKE ONE TO TWO TABLETS BY MOUTH EVERY 6 HOURS AS NEEDED FOR FAST HEART RATES 30 tablet 9   EPINEPHRINE 0.3 mg/0.3 mL IJ SOAJ injection inject 0.87mls into the muscle once as needed for anaphylaxis 2 each 3   flecainide (TAMBOCOR) 50 MG tablet Take 1 tablet (50 mg total) by mouth 2 (two) times daily. 180 tablet 3   furosemide (LASIX) 20 MG tablet TAKE 1 TABLET BY MOUTH ONCE DAILY 90 tablet 3  hydroxychloroquine (PLAQUENIL) 200 MG tablet Take 200 mg by mouth 2 (two) times daily.     levothyroxine (SYNTHROID) 50 MCG tablet TAKE ONE TABLET BY MOUTH DAILY BEFORE BREAKFAST 90 tablet 1   OXYGEN Inhale 3 L/min into the lungs at bedtime. Continuously     telmisartan (MICARDIS) 80 MG tablet Take 1 tablet (80 mg total) by mouth daily. 90 tablet 3   traMADol (ULTRAM) 50 MG tablet Take 1-2 tablets (50-100 mg  total) by mouth every 6 (six) hours as needed for moderate pain. 15 tablet 0   No facility-administered medications prior to visit.        Objective:   Physical Exam Vitals:   12/07/22 0926  BP: (!) 140/70  Pulse: 65  SpO2: 94%  Weight: 271 lb 9.6 oz (123.2 kg)  Height: 5\' 1"  (1.549 m)   Gen: Pleasant, overweight woman, in no distress,  normal affect  ENT: No lesions,  mouth clear,  oropharynx clear, no postnasal drip  Neck: No JVD, no stridor  Lungs: No use of accessory muscles, no crackles or wheezing on normal respiration, no wheeze on forced expiration, decreased to both bases  Cardiovascular: RRR, heart sounds normal, no murmur or gallops, trace ankle edema  Musculoskeletal: No deformities, no cyanosis or clubbing  Neuro: alert, awake, non focal  Skin: Warm, no lesions or rash      Assessment & Plan:  Abnormal CT of the chest Based on the comparison report I have available from [facility] in [place], it seems that her pulmonary nodules have largely resolved.  The official radiology report is not back on this scan, will review that and determine whether we believe there are any nodules that merit serial follow-up.  Obstructive sleep apnea OSA confirmed on her home PSG.  She is willing to try CPAP.  We will ask DME to do a best mask fit assessment, start her on CPAP with a range of 8-20 cmH2O, heated humidity.  Follow-up in 3 months to assess her progress on CPAP.    Levy Pupa, MD, PhD 12/07/2022, 10:07 AM [facility] [phone] or if no answer before 7:00PM call [phone] For any issues after 7:00PM please call eLink [phone]

## 2022-12-07 NOTE — Assessment & Plan Note (Signed)
Based on the comparison report I have available from [facility] in [place], it seems that her pulmonary nodules have largely resolved.  The official radiology report is not back on this scan, will review that and determine whether we believe there are any nodules that merit serial follow-up.

## 2022-12-10 ENCOUNTER — Telehealth: Payer: Self-pay | Admitting: Family Medicine

## 2022-12-10 ENCOUNTER — Encounter: Payer: Self-pay | Admitting: Emergency Medicine

## 2022-12-12 NOTE — Telephone Encounter (Signed)
I have reached out to Patient per my Notes "phone did not ring went straight to VM - I M

## 2022-12-24 ENCOUNTER — Other Ambulatory Visit: Payer: Self-pay | Admitting: Internal Medicine

## 2022-12-24 DIAGNOSIS — I480 Paroxysmal atrial fibrillation: Secondary | ICD-10-CM

## 2022-12-24 NOTE — Telephone Encounter (Signed)
Prescription refill request for Eliquis received. Indication: Afib  Last office visit: 10/03/22 [facility] Knife)  Scr: 1.12 (11/20/22)  Age: 67 Weight: 123.2kg  Appropriate dose. Refill sent.

## 2022-12-25 DIAGNOSIS — H47291 Other optic atrophy, right eye: Secondary | ICD-10-CM | POA: Diagnosis not present

## 2022-12-25 DIAGNOSIS — Z961 Presence of intraocular lens: Secondary | ICD-10-CM | POA: Diagnosis not present

## 2022-12-25 DIAGNOSIS — Z79899 Other long term (current) drug therapy: Secondary | ICD-10-CM | POA: Diagnosis not present

## 2022-12-25 DIAGNOSIS — H18513 Endothelial corneal dystrophy, bilateral: Secondary | ICD-10-CM | POA: Diagnosis not present

## 2022-12-25 DIAGNOSIS — H35372 Puckering of macula, left eye: Secondary | ICD-10-CM | POA: Diagnosis not present

## 2022-12-25 DIAGNOSIS — H43813 Vitreous degeneration, bilateral: Secondary | ICD-10-CM | POA: Diagnosis not present

## 2022-12-26 ENCOUNTER — Ambulatory Visit: Payer: Medicare PPO | Admitting: Emergency Medicine

## 2022-12-31 ENCOUNTER — Encounter: Payer: Self-pay | Admitting: Family Medicine

## 2022-12-31 MED ORDER — BUSPIRONE HCL 10 MG PO TABS: 20.0000 mg | ORAL_TABLET | Freq: Two times a day (BID) | ORAL | 1 refills | Status: DC

## 2023-01-04 DIAGNOSIS — U071 COVID-19: Secondary | ICD-10-CM | POA: Diagnosis not present

## 2023-01-08 ENCOUNTER — Other Ambulatory Visit: Payer: Medicare PPO

## 2023-01-08 ENCOUNTER — Ambulatory Visit: Payer: Medicare PPO | Admitting: Family Medicine

## 2023-01-10 ENCOUNTER — Ambulatory Visit (HOSPITAL_COMMUNITY): Payer: Medicare PPO | Admitting: Physician Assistant

## 2023-01-11 ENCOUNTER — Ambulatory Visit: Payer: Medicare PPO

## 2023-01-11 ENCOUNTER — Telehealth: Payer: Medicare PPO | Admitting: Family Medicine

## 2023-01-11 ENCOUNTER — Encounter: Payer: Self-pay | Admitting: Family Medicine

## 2023-01-11 VITALS — BP 130/85

## 2023-01-11 DIAGNOSIS — Z8619 Personal history of other infectious and parasitic diseases: Secondary | ICD-10-CM

## 2023-01-11 DIAGNOSIS — F32A Depression, unspecified: Secondary | ICD-10-CM

## 2023-01-11 DIAGNOSIS — Z87440 Personal history of urinary (tract) infections: Secondary | ICD-10-CM | POA: Diagnosis not present

## 2023-01-11 DIAGNOSIS — F419 Anxiety disorder, unspecified: Secondary | ICD-10-CM | POA: Diagnosis not present

## 2023-01-11 DIAGNOSIS — N289 Disorder of kidney and ureter, unspecified: Secondary | ICD-10-CM | POA: Diagnosis not present

## 2023-01-11 MED ORDER — SERTRALINE HCL 50 MG PO TABS
50.0000 mg | ORAL_TABLET | Freq: Every day | ORAL | 3 refills | Status: DC
Start: 2023-01-11 — End: 2023-07-16

## 2023-01-11 NOTE — Progress Notes (Signed)
MyChart Video visit  Subjective: ZO:XWRUEA up AKI PCP: Raliegh Ip, DO [id number] Rebekah [name] is a 67 y.o. female. Patient provides verbal consent for consult held via video.  Due to COVID-19 pandemic this visit was conducted virtually. This visit type was conducted due to national recommendations for restrictions regarding the COVID-19 Pandemic (Rebekah.g. social distancing, sheltering in place) in an effort to limit this patient's exposure and mitigate transmission in our community. All issues noted in this document were discussed and addressed.  A physical exam was not performed with this format.   Location of patient: home Location of provider: [facility] Others present for call: none  1. AKI/ HTN/ GAD/Depression Function noted in the last couple of renal function checks.  This was initially thought to be secondary to severe sepsis and possible AKI.  She has been hydrating well and avoiding NSAID medications.  Blood pressure has been running high however since she started Pristiq that she reports that her mood has been under excellent control with the Pristiq.  She thinks that perhaps coming off the medication may be warranted.  She is willing to go over to Zoloft.  Sees her A-fib clinic soon.  Has seen urology noted have a tiny renal stone.  She does report ongoing concerned about development of urinary tract infection again because she was totally asymptomatic when the last 1, which led to severe sepsis, occurred.  She would like to have a urine specimen collected if possible.  She reports that she is recovering from COVID-19 but seems to be doing okay with this so far.  She still has not heard from the therapist referral and would like me to check up on that.   ROS: Per HPI  Allergies  Allergen Reactions   Beef-Derived Products Anaphylaxis    After tick bite, cannot eat beef, pork or lamb   Lambs Quarters Anaphylaxis    After tick bite cannot eat beef, pork or lamb   Mucinex [Guaifenesin Er]  Anaphylaxis   Pork-Derived Products Anaphylaxis    After tick bite cannot eat beef, pork or lamb   Darvon [Propoxyphene] Other (See Comments)    Hallucinations    Ace Inhibitors Swelling and Cough    Pedal Edema   Hydrocodone Itching   Ppd [Tuberculin Purified Protein Derivative]     Always has positive testing to PPD, do not use   Hydromet [Hydrocodone Bit-Homatrop Mbr] Itching and Other (See Comments)    Severe stomach pain-face itches.    Sulfonamide Derivatives Rash   Past Medical History:  Diagnosis Date   Allergy    Multiple   Anxiety    Arthritis    Atrial fibrillation with rapid ventricular response (HCC) 12/26/2021   Atrial fibrillation with RVR (HCC) 12/27/2021   Bilateral bunions    Cataract    Both eyes- lenses replaced   Chronic kidney disease 2022   Not kidney disease but kidney stone   Complication of anesthesia    Depression    More anxiety-worry-stress   Difficulty sleeping    prior sleep study did not reveal sleep apnea per patient   DJD (degenerative joint disease)    Dyspnea    Dysrhythmia    a-fib   Environmental allergies    Food allergy    Gallbladder problem    GERD (gastroesophageal reflux disease)    Heart murmur    History of kidney stones    Hypertension    no meds after weight loss   Hypothyroidism  Incontinence of urine    at nite    Joint pain    Left atrial dilatation    Left foot pain    Leg edema    Obesity    s/p gastric sleeve 12/2013 (previously weighed close to 400 lbs)   Obstructive sleep apnea 12/07/2022   Osteoarthritis    Oxygen deficiency    3L/min for sleep due to slowed reaperations during sleep   Palpitations    Paroxysmal atrial fibrillation (HCC)    Pneumonia    hx of    PONV (postoperative nausea and vomiting)    Status post bilateral knee replacements    Supplemental oxygen dependent    uses 2l/ at night, states HR goes low and O2 drops   Tuberculosis    had 6 month of INH due to exposure     Vaginal vault prolapse     Current Outpatient Medications:    acetaminophen (TYLENOL) 500 MG tablet, Take 1,000 mg by mouth every 6 (six) hours as needed for moderate pain or headache., Disp: , Rfl:    busPIRone (BUSPAR) 10 MG tablet, Take 2 tablets (20 mg total) by mouth 2 (two) times daily., Disp: 120 tablet, Rfl: 1   cetirizine (ZYRTEC) 10 MG tablet, Take 10 mg by mouth at bedtime., Disp: , Rfl:    desvenlafaxine (PRISTIQ) 50 MG 24 hr tablet, Take 1 tablet (50 mg total) by mouth daily., Disp: 90 tablet, Rfl: 0   diltiazem (CARDIZEM CD) 120 MG 24 hr capsule, Take 1 capsule (120 mg total) by mouth at bedtime., Disp: 90 capsule, Rfl: 3   diltiazem (CARDIZEM CD) 240 MG 24 hr capsule, Take 1 capsule (240 mg total) by mouth daily., Disp: 90 capsule, Rfl: 3   diltiazem (CARDIZEM) 30 MG tablet, TAKE ONE TO TWO TABLETS BY MOUTH EVERY 6 HOURS AS NEEDED FOR FAST HEART RATES, Disp: 30 tablet, Rfl: 9   ELIQUIS 5 MG TABS tablet, TAKE ONE TABLET BY MOUTH TWICE DAILY, Disp: 180 tablet, Rfl: 1   EPINEPHRINE 0.3 mg/0.3 mL IJ SOAJ injection, inject 0.9mls into the muscle once as needed for anaphylaxis, Disp: 2 each, Rfl: 3   flecainide (TAMBOCOR) 50 MG tablet, Take 1 tablet (50 mg total) by mouth 2 (two) times daily., Disp: 180 tablet, Rfl: 3   furosemide (LASIX) 20 MG tablet, TAKE 1 TABLET BY MOUTH ONCE DAILY, Disp: 90 tablet, Rfl: 3   hydroxychloroquine (PLAQUENIL) 200 MG tablet, Take 200 mg by mouth 2 (two) times daily., Disp: , Rfl:    levothyroxine (SYNTHROID) 50 MCG tablet, TAKE ONE TABLET BY MOUTH DAILY BEFORE BREAKFAST, Disp: 90 tablet, Rfl: 1   OXYGEN, Inhale 3 L/min into the lungs at bedtime. Continuously, Disp: , Rfl:    telmisartan (MICARDIS) 80 MG tablet, Take 1 tablet (80 mg total) by mouth daily., Disp: 90 tablet, Rfl: 3  Vitals:   01/11/23 1125 01/11/23 1126  BP: (!) 156/96 130/85   Gen: well apearing female, NAD Pulm: normal WOB on room air. No wheezes or dyspnea with speech Psych: mood  stable, speech normal  Assessment/ Plan: 67 y.o. female   Anxiety and depression - Plan: sertraline (ZOLOFT) 50 MG tablet  History of sepsis - Plan: CBC  History of UTI - Plan: CBC, Urinalysis, Routine w reflex microscopic  Impaired renal function - Plan: CMP14+EGFR, CBC, Microalbumin / creatinine urine ratio  Okay to taper off of Pristiq.  May take 1 tablet every other day for 1 week then stop.  Cross taper  with low-dose Zoloft.  Start at 25 mg daily and then after 1 week may go up to 50 mg daily.  Teach back method performed and patient understands how to switch over meds.  She will contact me if she finds the Zoloft to be ineffective in controlling her symptoms in which point we may need to consider revisiting Pristiq and simply adjusting her blood pressure medication regimen to accommodate the elevations associated with the Pristiq.  I have placed future order for urinalysis, urine microalbumin, CBC and CMP.  If microalbuminuria is present, may need to strongly consider starting something like Jardiance or [place].  My only hesitation would be the increased risk for urinary tract infections which she obviously had quite a severe case of recently.  Will certainly need to weigh the risk versus benefits and potentially even have her see nephrology for further assessment and recommendations.  Lab appointment has been scheduled for patient and she is aware of date and time.  Would like to see her back in the next 2-3 months to follow-up on Zoloft  Start time: 11:33a End time: 11:51a  Total time spent on patient care (including video visit/ documentation): 31 minutes  Rebekah Sanabia Hulen Skains, DO [facility] [phone]

## 2023-01-14 ENCOUNTER — Ambulatory Visit (HOSPITAL_COMMUNITY)
Admission: RE | Admit: 2023-01-14 | Discharge: 2023-01-14 | Disposition: A | Discharge status: Home / Self Care | Payer: Medicare PPO | Source: Ambulatory Visit | Attending: Physician Assistant | Admitting: Physician Assistant

## 2023-01-14 ENCOUNTER — Encounter (HOSPITAL_COMMUNITY): Payer: Self-pay | Admitting: Physician Assistant

## 2023-01-14 VITALS — BP 128/90 | HR 74 | Ht 61.0 in | Wt 271.4 lb

## 2023-01-14 DIAGNOSIS — Z7901 Long term (current) use of anticoagulants: Secondary | ICD-10-CM | POA: Diagnosis not present

## 2023-01-14 DIAGNOSIS — Z5181 Encounter for therapeutic drug level monitoring: Secondary | ICD-10-CM | POA: Diagnosis not present

## 2023-01-14 DIAGNOSIS — N189 Chronic kidney disease, unspecified: Secondary | ICD-10-CM | POA: Diagnosis not present

## 2023-01-14 DIAGNOSIS — G4733 Obstructive sleep apnea (adult) (pediatric): Secondary | ICD-10-CM | POA: Insufficient documentation

## 2023-01-14 DIAGNOSIS — Z8616 Personal history of COVID-19: Secondary | ICD-10-CM | POA: Insufficient documentation

## 2023-01-14 DIAGNOSIS — Z6841 Body Mass Index (BMI) 40.0 and over, adult: Secondary | ICD-10-CM | POA: Insufficient documentation

## 2023-01-14 DIAGNOSIS — Z79899 Other long term (current) drug therapy: Secondary | ICD-10-CM | POA: Insufficient documentation

## 2023-01-14 DIAGNOSIS — D6869 Other thrombophilia: Secondary | ICD-10-CM | POA: Diagnosis not present

## 2023-01-14 DIAGNOSIS — I129 Hypertensive chronic kidney disease with stage 1 through stage 4 chronic kidney disease, or unspecified chronic kidney disease: Secondary | ICD-10-CM | POA: Diagnosis not present

## 2023-01-14 DIAGNOSIS — I480 Paroxysmal atrial fibrillation: Secondary | ICD-10-CM | POA: Diagnosis not present

## 2023-01-14 DIAGNOSIS — E669 Obesity, unspecified: Secondary | ICD-10-CM | POA: Insufficient documentation

## 2023-01-14 NOTE — Progress Notes (Signed)
Primary Care Physician: Raliegh Ip, DO Primary Cardiologist: Chrystie Nose, MD Electrophysiologist: Will Jorja Loa, MD  Referring Physician: Dr Royann Shivers    Rebekah [name] is a 67 y.o. female with a history of CKD, HTN, OSA, atrial fibrillation who presents for follow up in the [facility]. Patient is on Eliquis for a CHADS2VASC score of 3. She has been maintained on flecainide for rhythm control.  Of note, she was hospitalized in [place] 08/2022 with sepsis due to obstructing kidney stone.   On follow up today, patient reports that she has done well from an afib standpoint. She did have 3-4 hours of afib when she tested positive for COVID but otherwise has been maintaining SR. No bleeding issues on anticoagulation.   Today, she denies symptoms of palpitations, chest pain, shortness of breath, orthopnea, PND, lower extremity edema, dizziness, presyncope, syncope, snoring, daytime somnolence, bleeding, or neurologic sequela. The patient is tolerating medications without difficulties and is otherwise without complaint today.    Atrial Fibrillation Risk Factors:  she does have symptoms or diagnosis of sleep apnea. she does not have a history of rheumatic fever.   Atrial Fibrillation Management history:  Previous antiarrhythmic drugs: flecainide  Previous cardioversions: none Previous ablations: none Anticoagulation history: Eliquis  ROS- All systems are reviewed and negative except as per the HPI above.  Past Medical History:  Diagnosis Date   Allergy    Multiple   Anxiety    Arthritis    Atrial fibrillation with rapid ventricular response (HCC) 12/26/2021   Atrial fibrillation with RVR (HCC) 12/27/2021   Bilateral bunions    Cataract    Both eyes- lenses replaced   Chronic kidney disease 2022   Not kidney disease but kidney stone   Complication of anesthesia    Depression    More anxiety-worry-stress   Difficulty sleeping     prior sleep study did not reveal sleep apnea per patient   DJD (degenerative joint disease)    Dyspnea    Dysrhythmia    a-fib   Environmental allergies    Food allergy    Gallbladder problem    GERD (gastroesophageal reflux disease)    Heart murmur    History of kidney stones    Hypertension    no meds after weight loss   Hypothyroidism    Incontinence of urine    at nite    Joint pain    Left atrial dilatation    Left foot pain    Leg edema    Obesity    s/p gastric sleeve 12/2013 (previously weighed close to 400 lbs)   Obstructive sleep apnea 12/07/2022   Osteoarthritis    Oxygen deficiency    3L/min for sleep due to slowed reaperations during sleep   Palpitations    Paroxysmal atrial fibrillation (HCC)    Pneumonia    hx of    PONV (postoperative nausea and vomiting)    Status post bilateral knee replacements    Supplemental oxygen dependent    uses 2l/[place] at night, states HR goes low and O2 drops   Tuberculosis    had 6 month of INH due to exposure    Vaginal vault prolapse     Current Outpatient Medications  Medication Sig Dispense Refill   acetaminophen (TYLENOL) 500 MG tablet Take 1,000 mg by mouth every 6 (six) hours as needed for moderate pain or headache.     busPIRone (BUSPAR) 10 MG tablet Take 2 tablets (  20 mg total) by mouth 2 (two) times daily. 120 tablet 1   cetirizine (ZYRTEC) 10 MG tablet Take 10 mg by mouth at bedtime.     diltiazem (CARDIZEM CD) 120 MG 24 hr capsule Take 1 capsule (120 mg total) by mouth at bedtime. 90 capsule 3   diltiazem (CARDIZEM CD) 240 MG 24 hr capsule Take 1 capsule (240 mg total) by mouth daily. 90 capsule 3   diltiazem (CARDIZEM) 30 MG tablet TAKE ONE TO TWO TABLETS BY MOUTH EVERY 6 HOURS AS NEEDED FOR FAST HEART RATES 30 tablet 9   ELIQUIS 5 MG TABS tablet TAKE ONE TABLET BY MOUTH TWICE DAILY 180 tablet 1   EPINEPHRINE 0.3 mg/0.3 mL IJ SOAJ injection inject 0.43mls into the muscle once as needed for anaphylaxis 2 each 3    flecainide (TAMBOCOR) 50 MG tablet Take 1 tablet (50 mg total) by mouth 2 (two) times daily. 180 tablet 3   furosemide (LASIX) 20 MG tablet TAKE 1 TABLET BY MOUTH ONCE DAILY 90 tablet 3   hydroxychloroquine (PLAQUENIL) 200 MG tablet Take 200 mg by mouth 2 (two) times daily.     levothyroxine (SYNTHROID) 50 MCG tablet TAKE ONE TABLET BY MOUTH DAILY BEFORE BREAKFAST 90 tablet 1   OXYGEN Inhale 3 L/min into the lungs at bedtime. Continuously     sertraline (ZOLOFT) 50 MG tablet Take 1 tablet (50 mg total) by mouth daily. 90 tablet 3   telmisartan (MICARDIS) 80 MG tablet Take 1 tablet (80 mg total) by mouth daily. 90 tablet 3   No current facility-administered medications for this encounter.    Physical Exam: BP (!) 128/90   Pulse 74   Ht 5\' 1"  (1.549 m)   Wt 123.1 kg   BMI 51.28 kg/m   GEN: Well nourished, well developed in no acute distress NECK: No JVD; No carotid bruits CARDIAC: Regular rate and rhythm, no murmurs, rubs, gallops RESPIRATORY:  Clear to auscultation without rales, wheezing or rhonchi  ABDOMEN: Soft, non-tender, non-distended EXTREMITIES:  No edema; No deformity   Wt Readings from Last 3 Encounters:  01/14/23 123.1 kg  12/07/22 123.2 kg  10/26/22 120.4 kg     EKG today demonstrates  SR, 1st degree AV block Vent. rate 74 BPM PR interval 210 ms QRS duration 162 ms QT/QTcB 432/479 ms  Echo 10/08/22 demonstrated   1. Left ventricular ejection fraction, by estimation, is 65 to 70%. The  left ventricle has normal function. The left ventricle has no regional  wall motion abnormalities. There is mild left ventricular hypertrophy.  Left ventricular diastolic parameters  were normal.   2. Right ventricular systolic function is normal. The right ventricular  size is normal. There is normal pulmonary artery systolic pressure. The  estimated right ventricular systolic pressure is 32.6 mmHg.   3. The mitral valve is normal in structure. Trivial mitral valve   regurgitation. No evidence of mitral stenosis.   4. The aortic valve was not well visualized. Aortic valve regurgitation  is trivial. No aortic stenosis is present.   5. Aortic dilatation noted. There is dilatation of the ascending aorta,  measuring 42 mm.   6. The inferior vena cava is normal in size with greater than 50%  respiratory variability, suggesting right atrial pressure of 3 mmHg.    CHA2DS2-VASc Score = 3  The patient's score is based upon: CHF History: 0 HTN History: 1 Diabetes History: 0 Stroke History: 0 Vascular Disease History: 0 Age Score: 1 Gender Score: 1  ASSESSMENT AND PLAN: Paroxysmal Atrial Fibrillation (ICD10:  I48.0) The patient's CHA2DS2-VASc score is 3, indicating a 3.2% annual risk of stroke.   Patient appears to be maintaining SR Continue flecainide 50 mg BID. Would not up titrate with RBBB. If QRS widens further, may need to discontinue. Continue diltiazem 240 mg AM and 120 mg PM with 30 mg PRN for heart racing.  Continue Eliquis 5 mg BID  Secondary Hypercoagulable State (ICD10:  D68.69) The patient is at significant risk for stroke/thromboembolism based upon her CHA2DS2-VASc Score of 3.  Continue Apixaban (Eliquis).   HTN Stable on current regimen  Obesity Body mass index is 51.28 kg/m.  Encouraged lifestyle modification  OSA  Encouraged nightly CPAP Followed by Dr Delton Coombes    Follow up in the AF clinic in 6 months.        Jorja Loa PA-C Afib Clinic [facility] [address] [place], [place] [address] [phone]

## 2023-01-15 ENCOUNTER — Encounter: Payer: Self-pay | Admitting: Professional Counselor

## 2023-01-15 ENCOUNTER — Other Ambulatory Visit: Payer: Medicare PPO

## 2023-01-15 ENCOUNTER — Ambulatory Visit: Payer: Medicare PPO | Admitting: Professional Counselor

## 2023-01-15 DIAGNOSIS — Z87440 Personal history of urinary (tract) infections: Secondary | ICD-10-CM

## 2023-01-15 DIAGNOSIS — F4322 Adjustment disorder with anxiety: Secondary | ICD-10-CM

## 2023-01-15 DIAGNOSIS — N289 Disorder of kidney and ureter, unspecified: Secondary | ICD-10-CM

## 2023-01-15 DIAGNOSIS — Z8619 Personal history of other infectious and parasitic diseases: Secondary | ICD-10-CM

## 2023-01-15 LAB — CMP14+EGFR
ALT: 12 [IU]/L (ref 0–32)
AST: 19 [IU]/L (ref 0–40)
Albumin: 4.4 g/dL (ref 3.9–4.9)
Alkaline Phosphatase: 80 [IU]/L (ref 44–121)
BUN/Creatinine Ratio: 19 (ref 12–28)
BUN: 25 mg/dL (ref 8–27)
Bilirubin Total: 0.3 mg/dL (ref 0.0–1.2)
CO2: 25 mmol/L (ref 20–29)
Calcium: 10.2 mg/dL (ref 8.7–10.3)
Chloride: 99 mmol/L (ref 96–106)
Creatinine, Ser: 1.3 mg/dL — ABNORMAL HIGH (ref 0.57–1.00)
Globulin, Total: 3.2 g/dL (ref 1.5–4.5)
Glucose: 96 mg/dL (ref 70–99)
Potassium: 4.3 mmol/L (ref 3.5–5.2)
Sodium: 139 mmol/L (ref 134–144)
Total Protein: 7.6 g/dL (ref 6.0–8.5)
eGFR: 45 mL/min/{1.73_m2} — ABNORMAL LOW (ref >59)

## 2023-01-15 LAB — URINALYSIS, ROUTINE W REFLEX MICROSCOPIC
Bilirubin, UA: NEGATIVE
Glucose, UA: NEGATIVE
Ketones, UA: NEGATIVE
Nitrite, UA: NEGATIVE
Protein,UA: NEGATIVE
RBC, UA: NEGATIVE
Specific Gravity, UA: 1.025 (ref 1.005–1.030)
Urobilinogen, Ur: 0.2 mg/dL (ref 0.2–1.0)
pH, UA: 6 (ref 5.0–7.5)

## 2023-01-15 LAB — CBC
Hematocrit: 39.5 % (ref 34.0–46.6)
Hemoglobin: 12.2 g/dL (ref 11.1–15.9)
MCH: 26.8 pg (ref 26.6–33.0)
MCHC: 30.9 g/dL — ABNORMAL LOW (ref 31.5–35.7)
MCV: 87 fL (ref 79–97)
Platelets: 202 10*3/uL (ref 150–450)
RBC: 4.55 x10E6/uL (ref 3.77–5.28)
RDW: 13.8 % (ref 11.7–15.4)
WBC: 5.1 10*3/uL (ref 3.4–10.8)

## 2023-01-15 LAB — MICROSCOPIC EXAMINATION
Epithelial Cells (non renal): NONE SEEN /[HPF] (ref 0–10)
RBC, Urine: NONE SEEN /[HPF] (ref 0–2)
Yeast, UA: NONE SEEN

## 2023-01-15 NOTE — BH Specialist Note (Signed)
Collaborative Care Initial Assessment  Session Start time: 9:00 am   Session End time: 10:00 am  Total time in minutes: 60 min   Type of Contact:  Face to Face Patient consent obtained:  Yes Types of Service: Collaborative care  Summary  Patient is a 67 yo female being referred to collaborative care by her pcp for anxiety and depression. Patient was engaged and cooperative during session.   Reason for referral in patient/family's own words:  "I have fear from a near death experience"  Patient's goal for today's visit: "I am not sure"  History of Present illness:   The patient is a 67 year old female with no documented history of anxiety or depression, presenting with anxiety symptoms following a near-death experience over the summer due to kidney stones. She reported passing out, being hospitalized, developing an infection that led to sepsis, and being told she might not survive. Since then, she has experienced persistent anxiety, worry, and fear, often thinking about the possibility of another similar episode. She has developed a fear of certain activities, such as traveling or visiting places far from hospitals, due to concerns about being too far from medical help if the situation were to recur. She described a lingering sense of impending doom but stated that this anxiety has not significantly impacted her day-to-day functioning.  Her GAD-7 score is 3, and her PHQ-9 score is 5, indicating relatively mild levels of anxiety and depression. However, she does have other medical conditions, including arthritis, which contribute to stress and interfere with her sleep due to pain. Recently, she declined attending her high school reunion because of fears related to her kidney stone history, despite her doctor reassuring her that a small stone they detected is not a cause for concern. She acknowledged difficulty moving past these fears and is seeking techniques to manage her anxiety.  The patient  is transitioning from Prestiq, which raised her blood pressure, to Zoloft. She has no history of psychiatric hospitalizations, suicide attempts, or substance abuse. Moving forward, a psychiatric consultation will be conducted to determine the appropriate course of treatment. The patient remains hopeful about making progress and learning how to navigate these emotions.  Clinical Assessment   PHQ-9 Assessments:    01/15/2023    9:29 AM 01/15/2023    9:25 AM 10/09/2022    3:38 PM 10/09/2022    3:37 PM 09/12/2022    2:38 PM  Depression screen PHQ 2/9  Decreased Interest 0 0  0 0  Down, Depressed, Hopeless 0 0  0 0  PHQ - 2 Score 0 0  0 0  Altered sleeping 3  3    Tired, decreased energy 1  2    Change in appetite 1  1    Feeling bad or failure about yourself  0  0    Trouble concentrating 0  0    Moving slowly or fidgety/restless 0  0    Suicidal thoughts 0  0    PHQ-9 Score 5      Difficult doing work/chores Not difficult at all  Not difficult at all      GAD-7 Assessments:    01/15/2023    9:30 AM 10/09/2022    3:38 PM 07/04/2022    9:06 AM 07/04/2022    8:20 AM  GAD 7 : Generalized Anxiety Score  Nervous, Anxious, on Edge 1 1 1  0  Control/stop worrying 0 0 0 0  Worry too much - different things 0 1 1 0  Trouble relaxing 0 1 1 0  Restless 0 1 1 0  Easily annoyed or irritable 1 1 1  0  Afraid - awful might happen 1 0 0 0  Total GAD 7 Score 3 5 5  0  Anxiety Difficulty Not difficult at all Somewhat difficult Somewhat difficult Not difficult at all     Social History:  Household: Lives with husband Marital status: Married Number of Children: 2 Employment: Retired Education: Masters  Psychiatric Review of systems: Insomnia: No Changes in appetite: Sleep disruptions due to discomfort in  Decreased need for sleep: No Family history of bipolar disorder: No Hallucinations: No   Paranoia: No    Psychotropic medications: Current medications: Pristiq and will transition to  Zoloft Patient taking medications as prescribed:  Yes Side effects reported: Yes elevated blood pressure.  Current medications (medication list) Current Outpatient Medications on File Prior to Visit  Medication Sig Dispense Refill   acetaminophen (TYLENOL) 500 MG tablet Take 1,000 mg by mouth every 6 (six) hours as needed for moderate pain or headache.     busPIRone (BUSPAR) 10 MG tablet Take 2 tablets (20 mg total) by mouth 2 (two) times daily. 120 tablet 1   cetirizine (ZYRTEC) 10 MG tablet Take 10 mg by mouth at bedtime.     diltiazem (CARDIZEM CD) 120 MG 24 hr capsule Take 1 capsule (120 mg total) by mouth at bedtime. 90 capsule 3   diltiazem (CARDIZEM CD) 240 MG 24 hr capsule Take 1 capsule (240 mg total) by mouth daily. 90 capsule 3   diltiazem (CARDIZEM) 30 MG tablet TAKE ONE TO TWO TABLETS BY MOUTH EVERY 6 HOURS AS NEEDED FOR FAST HEART RATES 30 tablet 9   ELIQUIS 5 MG TABS tablet TAKE ONE TABLET BY MOUTH TWICE DAILY 180 tablet 1   EPINEPHRINE 0.3 mg/0.3 mL IJ SOAJ injection inject 0.43mls into the muscle once as needed for anaphylaxis 2 each 3   flecainide (TAMBOCOR) 50 MG tablet Take 1 tablet (50 mg total) by mouth 2 (two) times daily. 180 tablet 3   furosemide (LASIX) 20 MG tablet TAKE 1 TABLET BY MOUTH ONCE DAILY 90 tablet 3   hydroxychloroquine (PLAQUENIL) 200 MG tablet Take 200 mg by mouth 2 (two) times daily.     levothyroxine (SYNTHROID) 50 MCG tablet TAKE ONE TABLET BY MOUTH DAILY BEFORE BREAKFAST 90 tablet 1   OXYGEN Inhale 3 L/min into the lungs at bedtime. Continuously     sertraline (ZOLOFT) 50 MG tablet Take 1 tablet (50 mg total) by mouth daily. 90 tablet 3   telmisartan (MICARDIS) 80 MG tablet Take 1 tablet (80 mg total) by mouth daily. 90 tablet 3   No current facility-administered medications on file prior to visit.    Psychiatric History: Past psychiatry diagnosis: No Patient currently being seen by therapist/psychiatrist: No Prior Suicide Attempts: No Past  psychiatry Hospitalization(s): No Past history of violence: No  Traumatic Experiences: History or current traumatic events (natural disaster, house fire, etc.) History or current physical trauma?  no History or current emotional trauma?  no History or current sexual trauma?  no History or current domestic or intimate partner violence?  no PTSD symptoms if any traumatic experiences no   Alcohol and/or Substance Use History   Tobacco Alcohol Other substances  Current use None None None  Past use     Past treatment      Withdrawal Potential:   Self-harm Behaviors Risk Assessment Self-harm risk factors: None Patient endorses recent thoughts of harming  self: Denies  Guns in the home: yes   Protective factors:   Danger to Others Risk Assessment Danger to others risk factors: None Patient endorses recent thoughts of harming others: Denies    Consulting civil engineer discussed emergency crisis plan with client and provided local emergency services resources.  Mental status exam:   General Appearance Luretha Murphy:  Casual Eye Contact:  Good Motor Behavior:  Normal Speech:  Normal Level of Consciousness:  Alert Mood:  Negative Affect:  Appropriate Anxiety Level:  Minimal Thought Process:  Coherent Thought Content:  WNL Perception:  Normal Judgment:  Good Insight:  Present  Diagnosis:   Goals: Increase healthy adjustment to current life circumstances   Interventions: CBT Cognitive Behavioral Therapy   Follow-up Plan:  Psychiatric consultation

## 2023-01-16 ENCOUNTER — Other Ambulatory Visit: Payer: Self-pay | Admitting: Family Medicine

## 2023-01-16 ENCOUNTER — Encounter: Payer: Self-pay | Admitting: Family Medicine

## 2023-01-16 DIAGNOSIS — N1831 Chronic kidney disease, stage 3a: Secondary | ICD-10-CM

## 2023-01-16 LAB — MICROALBUMIN / CREATININE URINE RATIO
Creatinine, Urine: 142.2 mg/dL
Microalb/Creat Ratio: 19 mg/g{creat} (ref 0–29)
Microalbumin, Urine: 27.3 ug/mL

## 2023-01-21 DIAGNOSIS — S9032XA Contusion of left foot, initial encounter: Secondary | ICD-10-CM | POA: Diagnosis not present

## 2023-01-21 DIAGNOSIS — M79672 Pain in left foot: Secondary | ICD-10-CM | POA: Diagnosis not present

## 2023-01-21 DIAGNOSIS — S93602A Unspecified sprain of left foot, initial encounter: Secondary | ICD-10-CM | POA: Diagnosis not present

## 2023-01-23 ENCOUNTER — Telehealth (INDEPENDENT_AMBULATORY_CARE_PROVIDER_SITE_OTHER): Payer: Medicare PPO | Admitting: Professional Counselor

## 2023-01-23 DIAGNOSIS — F4322 Adjustment disorder with anxiety: Secondary | ICD-10-CM

## 2023-01-23 NOTE — BH Specialist Note (Unsigned)
Virtual Behavioral Health Treatment Plan Team Note  MRN: [id number] NAME: Rebekah [name]  DATE: 01/24/23  Start time: Start Time: 1450 End time: Stop Time: 1500 Total time: Total Time in Minutes (Visit): 10 Documentation time: 20 min Total time: 30 min  Total number of Virtual BH Treatment Team Plan encounters: 1/4  Treatment Team Attendees: Dr. Vanetta Shawl and Esmond Harps  Assessment/Provisional Diagnosis Rebekah [name] is a 67 y.o. year old female with history of  CKD, hypertension, OSA, Afib, s/p bariatric surgery in 2015. The patient is referred for depression.    Pristiq was discontinued due to concern of hypertension.  She was started on sertraline.    # MDD, single episode, mild The patient experiences mood symptoms in the context of the following stressors.  BH specialist will offer CBT.    # Insomnia She reports experiencing insomnia due to physical issue. Will plan to provide sleep hygiene recommendations. According to the chart review, she was advised to start using a CPAP machine. We will inquire about this to optimize her care   Recommendation Continue sertraline 50 mg daily BH specialist to follow up every other week   Goals, Interventions and Follow-up Plan Goals: Increase healthy adjustment to current life circumstances Interventions: CBT Cognitive Behavioral Therapy Medication Management Recommendations: Continue sertraline 50 mg daily Follow-up Plan: Bi-weekly cbt therapy  History of the present illness Presenting Problem/Current Symptoms:  The patient is a 67 year old female with no documented history of anxiety or depression, presenting with anxiety symptoms following a near-death experience over the summer due to kidney stones. She reported passing out, being hospitalized, developing an infection that led to sepsis, and being told she might not survive. Since then, she has experienced persistent anxiety, worry, and fear, often thinking about the possibility of  another similar episode. She has developed a fear of certain activities, such as traveling or visiting places far from hospitals, due to concerns about being too far from medical help if the situation were to recur. She described a lingering sense of impending doom but stated that this anxiety has not significantly impacted her day-to-day functioning.   Her GAD-7 score is 3, and her PHQ-9 score is 5, indicating relatively mild levels of anxiety and depression. However, she does have other medical conditions, including arthritis, which contribute to stress and interfere with her sleep due to pain. Recently, she declined attending her high school reunion because of fears related to her kidney stone history, despite her doctor reassuring her that a small stone they detected is not a cause for concern. She acknowledged difficulty moving past these fears and is seeking techniques to manage her anxiety.   The patient is transitioning from Prestiq, which raised her blood pressure, to Zoloft. She has no history of psychiatric hospitalizations, suicide attempts, or substance abuse. Moving forward, a psychiatric consultation will be conducted to determine the appropriate course of treatment. The patient remains hopeful about making progress and learning how to navigate these emotions.   Screenings PHQ-9 Assessments:     01/15/2023    9:29 AM 01/15/2023    9:25 AM 10/09/2022    3:38 PM  Depression screen PHQ 2/9  Decreased Interest 0 0   Down, Depressed, Hopeless 0 0   PHQ - 2 Score 0 0   Altered sleeping 3  3  Tired, decreased energy 1  2  Change in appetite 1  1  Feeling bad or failure about yourself  0  0  Trouble concentrating 0  0  Moving slowly or fidgety/restless 0  0  Suicidal thoughts 0  0  PHQ-9 Score 5    Difficult doing work/chores Not difficult at all  Not difficult at all   GAD-7 Assessments:     01/15/2023    9:30 AM 10/09/2022    3:38 PM 07/04/2022    9:06 AM 07/04/2022    8:20 AM   GAD 7 : Generalized Anxiety Score  Nervous, Anxious, on Edge 1 1 1  0  Control/stop worrying 0 0 0 0  Worry too much - different things 0 1 1 0  Trouble relaxing 0 1 1 0  Restless 0 1 1 0  Easily annoyed or irritable 1 1 1  0  Afraid - awful might happen 1 0 0 0  Total GAD 7 Score 3 5 5  0  Anxiety Difficulty Not difficult at all Somewhat difficult Somewhat difficult Not difficult at all    Past Medical History Past Medical History:  Diagnosis Date   Allergy    Multiple   Anxiety    Arthritis    Atrial fibrillation with rapid ventricular response (HCC) 12/26/2021   Atrial fibrillation with RVR (HCC) 12/27/2021   Bilateral bunions    Cataract    Both eyes- lenses replaced   Chronic kidney disease 2022   Not kidney disease but kidney stone   Complication of anesthesia    Depression    More anxiety-worry-stress   Difficulty sleeping    prior sleep study did not reveal sleep apnea per patient   DJD (degenerative joint disease)    Dyspnea    Dysrhythmia    a-fib   Environmental allergies    Food allergy    Gallbladder problem    GERD (gastroesophageal reflux disease)    Heart murmur    History of kidney stones    Hypertension    no meds after weight loss   Hypothyroidism    Incontinence of urine    at nite    Joint pain    Left atrial dilatation    Left foot pain    Leg edema    Obesity    s/p gastric sleeve 12/2013 (previously weighed close to 400 lbs)   Obstructive sleep apnea 12/07/2022   Osteoarthritis    Oxygen deficiency    3L/min for sleep due to slowed reaperations during sleep   Palpitations    Paroxysmal atrial fibrillation (HCC)    Pneumonia    hx of    PONV (postoperative nausea and vomiting)    Status post bilateral knee replacements    Supplemental oxygen dependent    uses 2l/[place] at night, states HR goes low and O2 drops   Tuberculosis    had 6 month of INH due to exposure    Vaginal vault prolapse     Vital signs: There were no vitals filed  for this visit.  Allergies:  Allergies as of 01/23/2023 - Review Complete 01/14/2023  Allergen Reaction Noted   Beef-derived products Anaphylaxis 12/05/2016   Lambs quarters Anaphylaxis 12/05/2016   Mucinex [guaifenesin er] Anaphylaxis 07/02/2013   Pork-derived products Anaphylaxis 12/05/2016   Darvon [propoxyphene] Other (See Comments) 01/01/2017   Ace inhibitors Swelling and Cough 03/01/2014   Hydrocodone Itching 06/05/2022   Ppd [tuberculin purified protein derivative]  10/24/2018   Hydromet [hydrocodone bit-homatrop mbr] Itching and Other (See Comments) 09/05/2012   Sulfonamide derivatives Rash     Medication History Current medications:  Outpatient Encounter Medications as of 01/23/2023  Medication Sig   acetaminophen (  TYLENOL) 500 MG tablet Take 1,000 mg by mouth every 6 (six) hours as needed for moderate pain or headache.   busPIRone (BUSPAR) 10 MG tablet Take 2 tablets (20 mg total) by mouth 2 (two) times daily.   cetirizine (ZYRTEC) 10 MG tablet Take 10 mg by mouth at bedtime.   diltiazem (CARDIZEM CD) 120 MG 24 hr capsule Take 1 capsule (120 mg total) by mouth at bedtime.   diltiazem (CARDIZEM CD) 240 MG 24 hr capsule Take 1 capsule (240 mg total) by mouth daily.   diltiazem (CARDIZEM) 30 MG tablet TAKE ONE TO TWO TABLETS BY MOUTH EVERY 6 HOURS AS NEEDED FOR FAST HEART RATES   ELIQUIS 5 MG TABS tablet TAKE ONE TABLET BY MOUTH TWICE DAILY   EPINEPHRINE 0.3 mg/0.3 mL IJ SOAJ injection inject 0.38mls into the muscle once as needed for anaphylaxis   flecainide (TAMBOCOR) 50 MG tablet Take 1 tablet (50 mg total) by mouth 2 (two) times daily.   furosemide (LASIX) 20 MG tablet TAKE 1 TABLET BY MOUTH ONCE DAILY   hydroxychloroquine (PLAQUENIL) 200 MG tablet Take 200 mg by mouth 2 (two) times daily.   levothyroxine (SYNTHROID) 50 MCG tablet TAKE ONE TABLET BY MOUTH DAILY BEFORE BREAKFAST   OXYGEN Inhale 3 L/min into the lungs at bedtime. Continuously   sertraline (ZOLOFT) 50 MG  tablet Take 1 tablet (50 mg total) by mouth daily.   telmisartan (MICARDIS) 80 MG tablet Take 1 tablet (80 mg total) by mouth daily.   No facility-administered encounter medications on file as of 01/23/2023.     Scribe for Treatment Team: Reuel Boom

## 2023-01-25 ENCOUNTER — Other Ambulatory Visit: Payer: Medicare PPO

## 2023-01-30 ENCOUNTER — Other Ambulatory Visit: Payer: Self-pay | Admitting: Internal Medicine

## 2023-01-30 ENCOUNTER — Encounter: Payer: Self-pay | Admitting: Family Medicine

## 2023-02-04 DIAGNOSIS — H35372 Puckering of macula, left eye: Secondary | ICD-10-CM | POA: Diagnosis not present

## 2023-02-04 DIAGNOSIS — H47291 Other optic atrophy, right eye: Secondary | ICD-10-CM | POA: Diagnosis not present

## 2023-02-04 DIAGNOSIS — H18513 Endothelial corneal dystrophy, bilateral: Secondary | ICD-10-CM | POA: Diagnosis not present

## 2023-02-04 DIAGNOSIS — H43813 Vitreous degeneration, bilateral: Secondary | ICD-10-CM | POA: Diagnosis not present

## 2023-02-04 DIAGNOSIS — Z961 Presence of intraocular lens: Secondary | ICD-10-CM | POA: Diagnosis not present

## 2023-02-04 DIAGNOSIS — Z79899 Other long term (current) drug therapy: Secondary | ICD-10-CM | POA: Diagnosis not present

## 2023-02-04 MED ORDER — BUSPIRONE HCL 10 MG PO TABS: 20.0000 mg | ORAL_TABLET | Freq: Two times a day (BID) | ORAL | 1 refills | Status: DC

## 2023-02-05 ENCOUNTER — Ambulatory Visit: Payer: Medicare PPO | Admitting: Professional Counselor

## 2023-02-05 DIAGNOSIS — F4322 Adjustment disorder with anxiety: Secondary | ICD-10-CM

## 2023-02-05 NOTE — BH Specialist Note (Unsigned)
[facility] Telephone Follow-up  MRN: [id number] NAME: BAILEI VANDERMOLEN Date: 02/05/23  Start time: Start Time: 0900 End time: Stop Time: 0940 Total time: Total Time in Minutes (Visit): 40 Call number: Visit Number: 3- Third Visit  Reason for call today:  cpap. Adjustment period not impacting functioning.   PHQ-9 Scores:     02/05/2023    9:11 AM 01/15/2023    9:29 AM 01/15/2023    9:25 AM 10/09/2022    3:38 PM 10/09/2022    3:37 PM  Depression screen PHQ 2/9  Decreased Interest 0 0 0  0  Down, Depressed, Hopeless 0 0 0  0  PHQ - 2 Score 0 0 0  0  Altered sleeping 3 3  3    Tired, decreased energy 1 1  2    Change in appetite 1 1  1    Feeling bad or failure about yourself  0 0  0   Trouble concentrating 0 0  0   Moving slowly or fidgety/restless 0 0  0   Suicidal thoughts 0 0  0   PHQ-9 Score 5 5     Difficult doing work/chores Not difficult at all Not difficult at all  Not difficult at all    GAD-7 Scores:     02/05/2023    9:11 AM 01/15/2023    9:30 AM 10/09/2022    3:38 PM 07/04/2022    9:06 AM  GAD 7 : Generalized Anxiety Score  Nervous, Anxious, on Edge 0 1 1 1   Control/stop worrying 0 0 0 0  Worry too much - different things 0 0 1 1  Trouble relaxing 0 0 1 1  Restless 0 0 1 1  Easily annoyed or irritable 0 1 1 1   Afraid - awful might happen 0 1 0 0  Total GAD 7 Score 0 3 5 5   Anxiety Difficulty Not difficult at all Not difficult at all Somewhat difficult Somewhat difficult    Stress Current stressors:  cpap machine is uncomfortable, medical Sleep:  Disrupted Appetite:  Good Coping ability:  Good Patient taking medications as prescribed:  Yes  Current medications:  Outpatient Encounter Medications as of 02/05/2023  Medication Sig   acetaminophen (TYLENOL) 500 MG tablet Take 1,000 mg by mouth every 6 (six) hours as needed for moderate pain or headache.   busPIRone (BUSPAR) 10 MG tablet Take 2 tablets (20 mg total) by mouth 2 (two) times daily.    cetirizine (ZYRTEC) 10 MG tablet Take 10 mg by mouth at bedtime.   diltiazem (CARDIZEM CD) 120 MG 24 hr capsule Take 1 capsule (120 mg total) by mouth at bedtime.   diltiazem (CARDIZEM CD) 240 MG 24 hr capsule TAKE ONE CAPSULE BY MOUTH EVERY DAY   diltiazem (CARDIZEM) 30 MG tablet TAKE ONE TO TWO TABLETS BY MOUTH EVERY 6 HOURS AS NEEDED FOR FAST HEART RATES   ELIQUIS 5 MG TABS tablet TAKE ONE TABLET BY MOUTH TWICE DAILY   EPINEPHRINE 0.3 mg/0.3 mL IJ SOAJ injection inject 0.58mls into the muscle once as needed for anaphylaxis   flecainide (TAMBOCOR) 50 MG tablet Take 1 tablet (50 mg total) by mouth 2 (two) times daily.   furosemide (LASIX) 20 MG tablet TAKE 1 TABLET BY MOUTH ONCE DAILY   hydroxychloroquine (PLAQUENIL) 200 MG tablet Take 200 mg by mouth 2 (two) times daily.   levothyroxine (SYNTHROID) 50 MCG tablet TAKE ONE TABLET BY MOUTH DAILY BEFORE BREAKFAST   OXYGEN Inhale 3 L/min into the  lungs at bedtime. Continuously   sertraline (ZOLOFT) 50 MG tablet Take 1 tablet (50 mg total) by mouth daily.   telmisartan (MICARDIS) 80 MG tablet Take 1 tablet (80 mg total) by mouth daily.   No facility-administered encounter medications on file as of 02/05/2023.     Self-harm Behaviors Risk Assessment Self-harm risk factors:  None Patient endorses recent thoughts of harming self:  Denies   Danger to Others Risk Assessment Danger to others risk factors:  None Patient endorses recent thoughts of harming others:  Denies   Substance Use Assessment Patient recently consumed alcohol:    Alcohol Use Disorder Identification Test (AUDIT):     02/17/2021   11:29 AM 11/20/2021    5:07 PM 09/12/2022    2:38 PM 10/09/2022    9:32 AM  Alcohol Use Disorder Test (AUDIT)  1. How often do you have a drink containing alcohol? 0 1 0 0  2. How many drinks containing alcohol do you have on a typical day when you are drinking? 0 0 0   3. How often do you have six or more drinks on one occasion? 0 0 0   AUDIT-C  Score 0 1 0     Goals, Interventions and Follow-up Plan Goals: Increase healthy adjustment to current life circumstances Interventions: Mindfulness or Relaxation Training and CBT Cognitive Behavioral Therapy Follow-up Plan: 1 month follow up   Reuel Boom

## 2023-02-06 ENCOUNTER — Encounter: Payer: Self-pay | Admitting: Family Medicine

## 2023-02-06 DIAGNOSIS — G4733 Obstructive sleep apnea (adult) (pediatric): Secondary | ICD-10-CM | POA: Diagnosis not present

## 2023-02-13 DIAGNOSIS — G4733 Obstructive sleep apnea (adult) (pediatric): Secondary | ICD-10-CM | POA: Diagnosis not present

## 2023-02-27 ENCOUNTER — Ambulatory Visit: Payer: Medicare PPO | Admitting: Nurse Practitioner

## 2023-02-27 ENCOUNTER — Encounter: Payer: Self-pay | Admitting: Nurse Practitioner

## 2023-02-27 VITALS — BP 163/94 | HR 82 | Temp 97.1°F | Ht 61.0 in | Wt 280.6 lb

## 2023-02-27 DIAGNOSIS — F5102 Adjustment insomnia: Secondary | ICD-10-CM

## 2023-02-27 DIAGNOSIS — G4733 Obstructive sleep apnea (adult) (pediatric): Secondary | ICD-10-CM | POA: Diagnosis not present

## 2023-02-27 DIAGNOSIS — R918 Other nonspecific abnormal finding of lung field: Secondary | ICD-10-CM

## 2023-02-27 DIAGNOSIS — G4700 Insomnia, unspecified: Secondary | ICD-10-CM | POA: Insufficient documentation

## 2023-02-27 MED ORDER — ESZOPICLONE 1 MG PO TABS: 1.0000 mg | ORAL_TABLET | Freq: Every evening | ORAL | 0 refills | Status: DC | PRN

## 2023-02-27 NOTE — Patient Instructions (Addendum)
Continue to use CPAP every night, minimum of 4-6 hours a night.  Change equipment as directed. Wash your tubing with warm soap and water daily, hang to dry. Wash humidifier portion weekly. Use bottled, distilled water and change daily Be aware of reduced alertness and do not drive or operate heavy machinery if experiencing this or drowsiness.  Exercise encouraged, as tolerated. Healthy weight management discussed.  Avoid or decrease alcohol consumption and medications that make you more sleepy, if possible. Notify if persistent daytime sleepiness occurs even with consistent use of PAP therapy.  Adjust CPAP settings to 8-13 cmH2O  Mask fitting at [facility] - Have them try you on a nasal pillow mask or nasal cradle mask to see how you like this  Start Lunesta 1 mg At bedtime as needed for sleep. Do not drive after taking. Take within 30 minutes of intended sleep onset. Plan for 7-8 hours in the bed after taking. Stop medication and notify if any changes in sleep habits or mood occur. Make sure to apply your CPAP within 10 minutes of taking to avoid falling asleep without it, as this medication can make your sleep apnea worse without CPAP.  Recent CT chest looked good. No new nodules were identified. No necessary follow up is needed   Follow up in 6-8 weeks with Katie Cheyenne Bordeaux,NP, or sooner, if needed

## 2023-02-27 NOTE — Progress Notes (Signed)
@Patient  ID: Rebekah [name], female    DOB: [birth year], 67 y.o.   MRN: [id number]  Chief Complaint  Patient presents with   Follow-up    Referring provider: Raliegh Ip, DO  HPI: 67 year old female, never smoker followed for lung nodules and OSA on CPAP.  She is a patient Dr. Kavin Leech and last seen in office 12/07/2022.  Past medical history significant for A-fib on diltiazem and Eliquis, hypertension, PAH, hypothyroid, HLD, obesity.  TEST/EVENTS:  10/08/2022 echo: EF 65 to 70%.  RV size and function is normal.  Normal PASP.  Trivial MR.  Trivial AR.  Aortic dilatation. 11/20/2022 HST: AHI 20.2, SpO2 low 54% 12/03/2022 super D CT chest: Atherosclerosis.  Enlarged pulmonic trunk.  No suspicious pulmonary nodules.  12/07/2022: OV with Dr. Delton Coombes.  Seen in June for abnormal CT of the chest that showed some scattered patchy groundglass opacities, scattered solid and semisolid nodules including 8 mm mixed density nodule in the left upper lobe, 4 mm apical upper lobe nodules and 7 mm right upper lobe nodule.  Has a history of latent TB that was treated.  Repeat CT from August 2024 shows possible left upper lobe nodule but the other nodules appear resolved.  CT also showed enlarged RV and dilated main PA suggestive of possible pulmonary hypertension.  PSG was ordered given her cardiac history and findings on CT imaging to rule out underlying OSA.  She had a home sleep study that revealed moderate sleep apnea.  Willing to try CPAP -orders placed for auto CPAP 8-20 cmH2O.  02/27/2023: Today-follow-up Patient presents today for follow-up after starting on CPAP with her husband.  She has been wearing her CPAP most nights.  Unfortunately she is having a lot of difficulties with it.  She feels like she does not ever sleep when she is wearing it.  Tends to only get about 2 to 3 hours a night and then she is awake or tossing and turning the rest of the night.  She does keep the CPAP on even when she is awake.   She did not wear it the other night and slept for 8-1/2 hours, which she has not done in quite some time.  Feels like she is just is tired, if not more tired than when she started on therapy.  She does have a lot of issues with mask fit.  She has tried multiple different fullface masks.  The DME company told her that she would not be able to do a nasal mask but did not necessarily provide her with a reason other than it would not work for her.  She is currently waiting on a [employer] full facemask and size medium, which has already been mailed to her.  Feels like sometimes the pressures blow too hard.  She does have a daughter and friends who are on CPAP, who get a lot of benefit from use.  She wishes that she would feel this way.  She does have arthritis pains so tends to sleep in a recliner. She denies any sleep parasomnia/paralysis.  No issues with drowsy driving.  01/27/2023-02/25/2023: CPAP auto to 8-20 cmH2O 27/30 days; 87% >4 hr; average use 6 hours 47 minutes Pressure 95th 13.2 Leaks 95th 20.7 AHI 3.9  Allergies  Allergen Reactions   Beef-Derived Products Anaphylaxis    After tick bite, cannot eat beef, pork or lamb   Lambs Quarters Anaphylaxis    After tick bite cannot eat beef, pork or lamb   Mucinex [Guaifenesin  Er] Anaphylaxis   Pork-Derived Products Anaphylaxis    After tick bite cannot eat beef, pork or lamb   Darvon [Propoxyphene] Other (See Comments)    Hallucinations    Ace Inhibitors Swelling and Cough    Pedal Edema   Hydrocodone Itching   Ppd [Tuberculin Purified Protein Derivative]     Always has positive testing to PPD, do not use   Hydromet [Hydrocodone Bit-Homatrop Mbr] Itching and Other (See Comments)    Severe stomach pain-face itches.    Sulfonamide Derivatives Rash    Immunization History  Administered Date(s) Administered   Fluad Quad(high Dose 65+) 12/25/2018   Fluad Trivalent(High Dose 65+) 01/01/2023   Influenza Split 02/15/2012   Influenza,inj,Quad  PF,6+ Mos 01/06/2014, 01/30/2017, 01/26/2018   Influenza-Unspecified 01/26/2018, 02/08/2020   Moderna Covid-19 Fall Seasonal Vaccine 13yrs & older 01/01/2023   PFIZER(Purple Top)SARS-COV-2 Vaccination 08/17/2019, 09/07/2019, 02/08/2020   PNEUMOCOCCAL CONJUGATE-20 05/17/2021   Zoster Recombinant(Shingrix) 11/14/2020, 05/17/2021   Zoster, Live 11/24/2015    Past Medical History:  Diagnosis Date   Allergy    Multiple   Anxiety    Arthritis    Atrial fibrillation with rapid ventricular response (HCC) 12/26/2021   Atrial fibrillation with RVR (HCC) 12/27/2021   Bilateral bunions    Cataract    Both eyes- lenses replaced   Chronic kidney disease 2022   Not kidney disease but kidney stone   Complication of anesthesia    Depression    More anxiety-worry-stress   Difficulty sleeping    prior sleep study did not reveal sleep apnea per patient   DJD (degenerative joint disease)    Dyspnea    Dysrhythmia    a-fib   Environmental allergies    Food allergy    Gallbladder problem    GERD (gastroesophageal reflux disease)    Heart murmur    History of kidney stones    Hypertension    no meds after weight loss   Hypothyroidism    Incontinence of urine    at nite    Joint pain    Left atrial dilatation    Left foot pain    Leg edema    Obesity    s/p gastric sleeve 12/2013 (previously weighed close to 400 lbs)   Obstructive sleep apnea 12/07/2022   Osteoarthritis    Oxygen deficiency    3L/min for sleep due to slowed reaperations during sleep   Palpitations    Paroxysmal atrial fibrillation (HCC)    Pneumonia    hx of    PONV (postoperative nausea and vomiting)    Status post bilateral knee replacements    Supplemental oxygen dependent    uses 2l/[place] at night, states HR goes low and O2 drops   Tuberculosis    had 6 month of INH due to exposure    Vaginal vault prolapse     Tobacco History: Social History   Tobacco Use  Smoking Status Never   Passive exposure: Never   Smokeless Tobacco Never  Tobacco Comments   Tried 1-2 cigarettes as a child. Nothing else.   Counseling given: Not Answered Tobacco comments: Tried 1-2 cigarettes as a child. Nothing else.   Outpatient Medications Prior to Visit  Medication Sig Dispense Refill   acetaminophen (TYLENOL) 500 MG tablet Take 1,000 mg by mouth every 6 (six) hours as needed for moderate pain or headache.     busPIRone (BUSPAR) 10 MG tablet Take 2 tablets (20 mg total) by mouth 2 (two) times daily. 360 tablet  1   diltiazem (CARDIZEM CD) 120 MG 24 hr capsule Take 1 capsule (120 mg total) by mouth at bedtime. 90 capsule 3   diltiazem (CARDIZEM CD) 240 MG 24 hr capsule TAKE ONE CAPSULE BY MOUTH EVERY DAY 90 capsule 1   diltiazem (CARDIZEM) 30 MG tablet TAKE ONE TO TWO TABLETS BY MOUTH EVERY 6 HOURS AS NEEDED FOR FAST HEART RATES 30 tablet 9   ELIQUIS 5 MG TABS tablet TAKE ONE TABLET BY MOUTH TWICE DAILY 180 tablet 1   EPINEPHRINE 0.3 mg/0.3 mL IJ SOAJ injection inject 0.41mls into the muscle once as needed for anaphylaxis 2 each 3   flecainide (TAMBOCOR) 50 MG tablet Take 1 tablet (50 mg total) by mouth 2 (two) times daily. 180 tablet 3   furosemide (LASIX) 20 MG tablet TAKE 1 TABLET BY MOUTH ONCE DAILY 90 tablet 3   hydroxychloroquine (PLAQUENIL) 200 MG tablet Take 200 mg by mouth 2 (two) times daily.     levothyroxine (SYNTHROID) 50 MCG tablet TAKE ONE TABLET BY MOUTH DAILY BEFORE BREAKFAST 90 tablet 1   sertraline (ZOLOFT) 50 MG tablet Take 1 tablet (50 mg total) by mouth daily. 90 tablet 3   telmisartan (MICARDIS) 80 MG tablet Take 1 tablet (80 mg total) by mouth daily. 90 tablet 3   No facility-administered medications prior to visit.     Review of Systems:   Constitutional: No weight loss or gain, night sweats, fevers, chills, or lassitude. +fatigue  HEENT: No headaches, difficulty swallowing, tooth/dental problems, or sore throat. No sneezing, itching, ear ache, nasal congestion, or post nasal drip CV:   No chest pain, orthopnea, PND, swelling in lower extremities, anasarca, dizziness, palpitations, syncope Resp: +baseline shortness of breath with exertion or at rest. No excess mucus or change in color of mucus. No productive or non-productive. No hemoptysis. No wheezing.  No chest wall deformity GI:  No heartburn, indigestion, abdominal pain, nausea, vomiting, diarrhea, change in bowel habits, loss of appetite, bloody stools.  GU: No dysuria, change in color of urine, urgency or frequency.   Skin: No rash, lesions, ulcerations MSK:  No joint pain or swelling.  Neuro: No dizziness or lightheadedness.  Psych: No depression or anxiety. Mood stable. +sleep disturbance    Physical Exam:  BP (!) 163/94 (BP Location: Right Arm, Patient Position: Sitting, Cuff Size: Normal)   Pulse 82   Temp (!) 97.1 F (36.2 C) (Temporal)   Ht 5\' 1"  (1.549 m)   Wt 280 lb 9.6 oz (127.3 kg)   SpO2 98%   BMI 53.02 kg/m   GEN: Pleasant, interactive, well-kempt; obese; in no acute distress. HEENT:  Normocephalic and atraumatic. PERRLA. Sclera white. Nasal turbinates pink, moist and patent bilaterally. No rhinorrhea present. Oropharynx pink and moist, without exudate or edema. No lesions, ulcerations, or postnasal drip. Mallampati III/IV NECK:  Supple w/ fair ROM. No JVD present. Normal carotid impulses w/o bruits. Thyroid symmetrical with no goiter or nodules palpated. No lymphadenopathy.   CV: RRR, no m/r/g, no peripheral edema. Pulses intact, +2 bilaterally. No cyanosis, pallor or clubbing. PULMONARY:  Unlabored, regular breathing. Clear bilaterally A&P w/o wheezes/rales/rhonchi. No accessory muscle use.  GI: BS present and normoactive. Soft, non-tender to palpation. No organomegaly or masses detected.  MSK: No erythema, warmth or tenderness. Cap refil <2 sec all extrem. No deformities or joint swelling noted.  Neuro: A/Ox3. No focal deficits noted.   Skin: Warm, no lesions or rashe Psych: Normal affect and  behavior. Judgement and thought content  appropriate.     Lab Results:  CBC    Component Value Date/Time   WBC 5.1 01/15/2023 1032   WBC 12.6 (H) 12/27/2021 0529   WBC 12.2 (H) 12/27/2021 0529   RBC 4.55 01/15/2023 1032   RBC 4.95 12/27/2021 0529   RBC 5.03 12/27/2021 0529   HGB 12.2 01/15/2023 1032   HCT 39.5 01/15/2023 1032   PLT 202 01/15/2023 1032   MCV 87 01/15/2023 1032   MCH 26.8 01/15/2023 1032   MCH 28.3 12/27/2021 0529   MCH 27.6 12/27/2021 0529   MCHC 30.9 (L) 01/15/2023 1032   MCHC 31.8 12/27/2021 0529   MCHC 31.2 12/27/2021 0529   RDW 13.8 01/15/2023 1032   LYMPHSABS 1.9 10/22/2022 0820   MONOABS 1.0 12/27/2021 0529   EOSABS 0.3 10/22/2022 0820   BASOSABS 0.1 10/22/2022 0820    BMET    Component Value Date/Time   NA 139 01/15/2023 1032   K 4.3 01/15/2023 1032   CL 99 01/15/2023 1032   CO2 25 01/15/2023 1032   GLUCOSE 96 01/15/2023 1032   GLUCOSE 104 (H) 12/27/2021 0529   GLUCOSE 103 (H) 12/27/2021 0529   BUN 25 01/15/2023 1032   CREATININE 1.30 (H) 01/15/2023 1032   CALCIUM 10.2 01/15/2023 1032   GFRNONAA >60 12/27/2021 0529   GFRNONAA >60 12/27/2021 0529   GFRAA 104 08/13/2019 1458    BNP    Component Value Date/Time   BNP 107.6 (H) 12/26/2021 1012     Imaging:  No results found.  Administration History     None           No data to display          No results found for: "NITRICOXIDE"      Assessment & Plan:   Obstructive sleep apnea Moderate OSA on CPAP. She has good compliance and control; unfortunately struggling with sleep disturbances and insufficient sleep. She has a lot of mask difficulties as well.  Average pressure is 13 cm water.  Will adjust her CPAP settings to 8-13 cmH2O.  Plan to send for mask desensitization study.  Could consider CPAP titration study if she continues to have difficulties.  Will also start her on sleep aid to address adjustment insomniac symptoms.  Aware of proper care/use of CPAP.  Safe  during practices reviewed.  Encouraged to continue using nightly.  Reassess at follow-up.  Patient Instructions  Continue to use CPAP every night, minimum of 4-6 hours a night.  Change equipment as directed. Wash your tubing with warm soap and water daily, hang to dry. Wash humidifier portion weekly. Use bottled, distilled water and change daily Be aware of reduced alertness and do not drive or operate heavy machinery if experiencing this or drowsiness.  Exercise encouraged, as tolerated. Healthy weight management discussed.  Avoid or decrease alcohol consumption and medications that make you more sleepy, if possible. Notify if persistent daytime sleepiness occurs even with consistent use of PAP therapy.  Adjust CPAP settings to 8-13 cmH2O  Mask fitting at [facility] - Have them try you on a nasal pillow mask or nasal cradle mask to see how you like this  Start Lunesta 1 mg At bedtime as needed for sleep. Do not drive after taking. Take within 30 minutes of intended sleep onset. Plan for 7-8 hours in the bed after taking. Stop medication and notify if any changes in sleep habits or mood occur. Make sure to apply your CPAP within 10 minutes of taking to avoid  falling asleep without it, as this medication can make your sleep apnea worse without CPAP.  Recent CT chest looked good. No new nodules were identified. No necessary follow up is needed   Follow up in 6-8 weeks with Katie Jackqueline Aquilar,NP, or sooner, if needed    Insomnia Adjustment insomnia. See above. Will start Lunesta 1 mg At bedtime as needed for sleep. Side effect profile reviewed. Aware to not drive or operate heavy machinery after taking. Understands to monitor for mood changes/morning hangover/changes in sleep habits. Reassess response at follow up.   Morbid obesity (HCC) BMI 53. Healthy weight loss encouraged  Lung nodules Resolved on repeat imaging 11/2022. Never smoker. No need for dedicated follow up.    Advised if symptoms  do not improve or worsen, to please contact office for sooner follow up or seek emergency care.   I spent 35 minutes of dedicated to the care of this patient on the date of this encounter to include pre-visit review of records, face-to-face time with the patient discussing conditions above, post visit ordering of testing, clinical documentation with the electronic health record, making appropriate referrals as documented, and communicating necessary findings to members of the patients care team.  Noemi Chapel, NP 02/27/2023  Pt aware and understands NP's role.

## 2023-02-27 NOTE — Assessment & Plan Note (Signed)
Moderate OSA on CPAP. She has good compliance and control; unfortunately struggling with sleep disturbances and insufficient sleep. She has a lot of mask difficulties as well.  Average pressure is 13 cm water.  Will adjust her CPAP settings to 8-13 cmH2O.  Plan to send for mask desensitization study.  Could consider CPAP titration study if she continues to have difficulties.  Will also start her on sleep aid to address adjustment insomniac symptoms.  Aware of proper care/use of CPAP.  Safe during practices reviewed.  Encouraged to continue using nightly.  Reassess at follow-up.  Patient Instructions  Continue to use CPAP every night, minimum of 4-6 hours a night.  Change equipment as directed. Wash your tubing with warm soap and water daily, hang to dry. Wash humidifier portion weekly. Use bottled, distilled water and change daily Be aware of reduced alertness and do not drive or operate heavy machinery if experiencing this or drowsiness.  Exercise encouraged, as tolerated. Healthy weight management discussed.  Avoid or decrease alcohol consumption and medications that make you more sleepy, if possible. Notify if persistent daytime sleepiness occurs even with consistent use of PAP therapy.  Adjust CPAP settings to 8-13 cmH2O  Mask fitting at [facility] - Have them try you on a nasal pillow mask or nasal cradle mask to see how you like this  Start Lunesta 1 mg At bedtime as needed for sleep. Do not drive after taking. Take within 30 minutes of intended sleep onset. Plan for 7-8 hours in the bed after taking. Stop medication and notify if any changes in sleep habits or mood occur. Make sure to apply your CPAP within 10 minutes of taking to avoid falling asleep without it, as this medication can make your sleep apnea worse without CPAP.  Recent CT chest looked good. No new nodules were identified. No necessary follow up is needed   Follow up in 6-8 weeks with Katie Clayton Jarmon,NP, or sooner, if needed

## 2023-02-27 NOTE — Assessment & Plan Note (Signed)
BMI 53. Healthy weight loss encouraged

## 2023-02-27 NOTE — Assessment & Plan Note (Signed)
Adjustment insomnia. See above. Will start Lunesta 1 mg At bedtime as needed for sleep. Side effect profile reviewed. Aware to not drive or operate heavy machinery after taking. Understands to monitor for mood changes/morning hangover/changes in sleep habits. Reassess response at follow up.

## 2023-02-27 NOTE — Assessment & Plan Note (Signed)
Resolved on repeat imaging 11/2022. Never smoker. No need for dedicated follow up.

## 2023-03-04 ENCOUNTER — Ambulatory Visit: Payer: Medicare PPO | Admitting: Adult Health

## 2023-03-12 ENCOUNTER — Encounter: Payer: Self-pay | Admitting: Family Medicine

## 2023-03-12 ENCOUNTER — Ambulatory Visit: Payer: Medicare PPO | Admitting: Professional Counselor

## 2023-03-15 DIAGNOSIS — I5032 Chronic diastolic (congestive) heart failure: Secondary | ICD-10-CM | POA: Diagnosis not present

## 2023-03-15 DIAGNOSIS — I129 Hypertensive chronic kidney disease with stage 1 through stage 4 chronic kidney disease, or unspecified chronic kidney disease: Secondary | ICD-10-CM | POA: Diagnosis not present

## 2023-03-15 DIAGNOSIS — G4733 Obstructive sleep apnea (adult) (pediatric): Secondary | ICD-10-CM | POA: Diagnosis not present

## 2023-03-15 DIAGNOSIS — N1831 Chronic kidney disease, stage 3a: Secondary | ICD-10-CM | POA: Diagnosis not present

## 2023-03-16 DIAGNOSIS — G4733 Obstructive sleep apnea (adult) (pediatric): Secondary | ICD-10-CM | POA: Diagnosis not present

## 2023-03-20 ENCOUNTER — Other Ambulatory Visit (HOSPITAL_BASED_OUTPATIENT_CLINIC_OR_DEPARTMENT_OTHER): Payer: Medicare PPO

## 2023-03-21 NOTE — Progress Notes (Unsigned)
Cardiology Office Note:    Date:  03/21/2023   ID:  Rebekah [name], DOB 02/03/1956, MRN [id number]  PCP:  Raliegh Ip, DO  Cardiologist:  Chrystie Nose, MD Electrophysiologist:  Regan Lemming, MD { Click to update primary MD,subspecialty MD or APP then REFRESH:1}    Referring MD: Raliegh Ip, DO   Chief Complaint: follow-up of atrial fibrillation  History of Present Illness:    Rebekah [name] is a 67 y.o. female with a history of persistent atrial fibrillation on Flecainide and Eliquis, chronic hypoxic respiratory failure on *** O2 at home, hypertension, CKD IIIa, GERD, and obesity s/p laparoscopic sleeve gastrectomy who is followed by Dr. Rennis Golden and Dr. Elberta Fortis and presents today for routine follow-up.   Patient was previously followed by Dr. Alanda Amass and now follows with Dr. Rennis Golden. Patient developed post-op atrial fibrillation after gastric sleeve surgery in 12/2013. Echo at that time showed LVEF of 65-70% with normal wall motion and grade 2 diastolic dysfunction. She was previously used pill-in-pocket Flecainide for her atrial fibrillation but this was ultimately increased to daily maintenance use in 06/2021 due to more frequent episodes of atrial fibrillation. Myoview in 03/2015  and 07/2021 showed no evidence of ischemia.  She was admitted in 12/2021 with COVID and had recurrent atrial fibrillation with RVR. She was given an extra dose of Flecainide and ultimately converted back to normal sinus rhythm. She also follows with Dr. Elberta Fortis and the [facility]. Per Dr. Elberta Fortis last note in 06/2022, she is not felt to be a candidate for ablation given her BMI. However, Tikosyn is an option if patient ever wishes to try a different medication.   She was admitted at [facility] in 08/2022 for septic shock secondary to UTI/ pyelonephritis. Blood culture were positive for E.coli. CT showed a left sided obstructing renal stone and she underwent left ureteral  stenting. She required Phenylephrine due to persistent hypotension despite IV fluids. She was treated with antibiotics. Hospitalization was complicated by acute hypoxic respiratory failure requiring BiPAP, atrial fibrillation with RVR, and AKI. Echo showed LVEF around 65%, mildly dilated RV with normal RV function, and elevated PASP of 48 mmHg. She thankfully converted back to normal sinus rhythm during admission.  She was last seen by Perlie Gold, PA-C, in 09/2022 at which time she reported a decrease in energy and exertional tolerance since hospitalization but she denied any chest pain or palpitations and had not had any recurrence of atrial fibrillation. Repeat Echo and Myoview were ordered. Echo showed LVEF of 65-70% with normal wall motion and diastolic parameters, normal RV size and function, and normal PASP. Also showed dilatation of the ascending aorta measuring 42 mm. Myoview was low risk with no evidence of ischemia.   She was seen in the [facility] in 12/2022 and was maintaining sinus rhythm at that time. She was continued on current dose of Flecainide and Cardizem.  Patient presents today for follow-up. ***  Persistent Atrial Fibrillation Maintaining sinus rhythm. QTc *** - Continue Flecainide 50mg  twice daily.  - Continue Cardizem CD 240mg  in the AM and 120mg  in the PM. *** - Continue chronic anticoagulation with Eliquis 5mg  twice daily.  - Of note, per Clint Fenton's note in 12/2022: He does not recommend up-titrating dose of Flecainide given RBBB. And if QRS widens further, we may need to discontinue this.   Chronic Hypoxic Respiratory Failure ***  Hypertension BP well controlled. *** - Continue Cardizem CD as above. -  Continue Telmisartan 80mg  daily.   CKD Stage IIIa Baseline creatinine around 1.0 to 1.3. Stable at 1.30 in 01/2023.   Obesity  ***  EKGs/Labs/Other Studies Reviewed:    The following studies were reviewed:  Echocardiogram 10/08/2022: Impressions: 1.  Left ventricular ejection fraction, by estimation, is 65 to 70%. The  left ventricle has normal function. The left ventricle has no regional  wall motion abnormalities. There is mild left ventricular hypertrophy.  Left ventricular diastolic parameters  were normal.   2. Right ventricular systolic function is normal. The right ventricular  size is normal. There is normal pulmonary artery systolic pressure. The  estimated right ventricular systolic pressure is 32.6 mmHg.   3. The mitral valve is normal in structure. Trivial mitral valve  regurgitation. No evidence of mitral stenosis.   4. The aortic valve was not well visualized. Aortic valve regurgitation  is trivial. No aortic stenosis is present.   5. Aortic dilatation noted. There is dilatation of the ascending aorta,  measuring 42 mm.   6. The inferior vena cava is normal in size with greater than 50%  respiratory variability, suggesting right atrial pressure of 3 mmHg.  _______________  Myoview 10/10/2022:   The study is normal. The study is low risk.   No ST deviation was noted.   Left ventricular function is normal. Nuclear stress EF: 58 %. The left ventricular ejection fraction is normal (55-65%). End diastolic cavity size is normal.   Prior study available for comparison from 08/03/2021.   EKG:  EKG ordered today. EKG personally reviewed and demonstrates ***.  Recent Labs: 07/06/2022: TSH 2.850 01/15/2023: ALT 12; BUN 25; Creatinine, Ser 1.30; Hemoglobin 12.2; Platelets 202; Potassium 4.3; Sodium 139  Recent Lipid Panel    Component Value Date/Time   CHOL 150 06/29/2022 0954   TRIG 143 06/29/2022 0954   HDL 60 06/29/2022 0954   CHOLHDL 2.5 06/29/2022 0954   CHOLHDL 4.2 07/02/2013 1301   VLDL 43 (H) 07/02/2013 1301   LDLCALC 66 06/29/2022 0954    Physical Exam:    Vital Signs: There were no vitals taken for this visit.    Wt Readings from Last 3 Encounters:  02/27/23 280 lb 9.6 oz (127.3 kg)  01/14/23 271 lb 6.4 oz  (123.1 kg)  12/07/22 271 lb 9.6 oz (123.2 kg)     General: 67 y.o. female in no acute distress. HEENT: Normocephalic and atraumatic. Sclera clear.  Neck: Supple. No carotid bruits. No JVD. Heart: *** RRR. Distinct S1 and S2. No murmurs, gallops, or rubs.  Lungs: No increased work of breathing. Clear to ausculation bilaterally. No wheezes, rhonchi, or rales.  Abdomen: Soft, non-distended, and non-tender to palpation.  Extremities: No lower extremity edema.  Radial and distal pedal pulses 2+ and equal bilaterally. Skin: Warm and dry. Neuro: No focal deficits. Psych: Normal affect. Responds appropriately.   Assessment:    No diagnosis found.  Plan:     Disposition: Follow up in ***   Signed, Corrin Parker, PA-C  03/21/2023 8:17 AM    [facility]

## 2023-03-25 ENCOUNTER — Telehealth: Payer: Self-pay | Admitting: Family Medicine

## 2023-03-25 ENCOUNTER — Ambulatory Visit: Payer: Medicare PPO | Admitting: Internal Medicine

## 2023-03-25 NOTE — Telephone Encounter (Signed)
Pt is needing her labs done  for dr.manpreet the third week in feb she was referred to this dr by dr Percell Miller pt  also has a 24 urine test that she needs to ship off but wanted to know if she could just bring it to the lab pt need's a call back

## 2023-03-25 NOTE — Telephone Encounter (Signed)
Called and [facility]   Per lab patient will need lab orders and they will have to be for [facility].

## 2023-03-26 ENCOUNTER — Ambulatory Visit (HOSPITAL_BASED_OUTPATIENT_CLINIC_OR_DEPARTMENT_OTHER): Payer: Medicare PPO | Attending: Nurse Practitioner | Admitting: Radiology

## 2023-03-26 DIAGNOSIS — G4733 Obstructive sleep apnea (adult) (pediatric): Secondary | ICD-10-CM

## 2023-04-01 ENCOUNTER — Encounter: Payer: Self-pay | Admitting: Family Medicine

## 2023-04-04 ENCOUNTER — Ambulatory Visit: Payer: Medicare PPO | Attending: Student | Admitting: Student

## 2023-04-04 ENCOUNTER — Encounter: Payer: Self-pay | Admitting: Student

## 2023-04-04 VITALS — BP 134/82 | HR 63 | Ht 61.0 in | Wt 284.0 lb

## 2023-04-04 DIAGNOSIS — N1831 Chronic kidney disease, stage 3a: Secondary | ICD-10-CM | POA: Diagnosis not present

## 2023-04-04 DIAGNOSIS — I77810 Thoracic aortic ectasia: Secondary | ICD-10-CM

## 2023-04-04 DIAGNOSIS — I10 Essential (primary) hypertension: Secondary | ICD-10-CM | POA: Diagnosis not present

## 2023-04-04 DIAGNOSIS — I4819 Other persistent atrial fibrillation: Secondary | ICD-10-CM

## 2023-04-04 DIAGNOSIS — G4733 Obstructive sleep apnea (adult) (pediatric): Secondary | ICD-10-CM | POA: Diagnosis not present

## 2023-04-04 NOTE — Patient Instructions (Signed)
   Lab Work:  Your physician recommends that you return for lab work in MAY PRIOR TO CTA  If you have labs (blood work) drawn today and your tests are completely normal, you will receive your results only by: MyChart Message (if you have MyChart) OR A paper copy in the mail If you have any lab test that is abnormal or we need to change your treatment, we will call you to review the results.   Testing/Procedures:  CTA OF THE CHEST TO FOLLOW DILATED AORTA DUE IN MAY 2025 AT [facility]   Follow-Up: At [facility], you and your health needs are our priority.  As part of our continuing mission to provide you with exceptional heart care, we have created designated Provider Care Teams.  These Care Teams include your primary Cardiologist (physician) and Advanced Practice Providers (APPs -  Physician Assistants and Nurse Practitioners) who all work together to provide you with the care you need, when you need it.  Your next appointment:   6 month(s)  Provider:   Chrystie Nose, MD

## 2023-04-04 NOTE — Progress Notes (Signed)
Order(s) created erroneously. Erroneous order ID: [id number]  Order moved by: Ian Malkin  Order move date/time: 04/04/2023 3:19 PM  Source Patient: [id number]  Source Contact: 04/04/2023  Destination Patient: [id number]  Destination Contact: 09/26/2022

## 2023-04-11 NOTE — Telephone Encounter (Signed)
Katie,  Please see patient comment and advise. Thank you.

## 2023-04-14 DIAGNOSIS — G4733 Obstructive sleep apnea (adult) (pediatric): Secondary | ICD-10-CM | POA: Diagnosis not present

## 2023-04-15 DIAGNOSIS — G4733 Obstructive sleep apnea (adult) (pediatric): Secondary | ICD-10-CM | POA: Diagnosis not present

## 2023-04-23 NOTE — Telephone Encounter (Signed)
 Patient identification verified by 2 forms. Rebekah Cooks, RN    Called and spoke to patient  Patient states:   -sick with stomach bug, fever is gone, diarrhea is improving   -due to sickness, has been experiencing A-fib episodes -has been in Afib since  04/21/23  -usually has episodes of afib when she becomes sick and self converts when sickness passes -Taking diltiazem  240mg  every morning and diltiazem  120mg  nightly   -would like to know what is the maximum amount of diltiazem  she can take a day -how much of the diltiazem  30mg  can she take on top of her daily doses -currently takes 360mg  total of diltiazem  daily  -when resting her rate is <100, with activity it increases 110-120  -does not think she can come in for appointment due to stomach bug  Patient denies at time of call:  -Palpitation   -SOB   -chest pain Informed patient message sent for input/advisement  Patient has no further questions at this time

## 2023-04-30 ENCOUNTER — Other Ambulatory Visit: Payer: Self-pay | Admitting: Internal Medicine

## 2023-04-30 ENCOUNTER — Other Ambulatory Visit: Payer: Self-pay | Admitting: Family Medicine

## 2023-05-01 ENCOUNTER — Encounter: Payer: Self-pay | Admitting: Nurse Practitioner

## 2023-05-01 ENCOUNTER — Telehealth: Payer: Medicare PPO | Admitting: Nurse Practitioner

## 2023-05-01 ENCOUNTER — Ambulatory Visit: Payer: Medicare PPO | Admitting: Nurse Practitioner

## 2023-05-01 DIAGNOSIS — F5101 Primary insomnia: Secondary | ICD-10-CM

## 2023-05-01 DIAGNOSIS — G4733 Obstructive sleep apnea (adult) (pediatric): Secondary | ICD-10-CM | POA: Diagnosis not present

## 2023-05-01 DIAGNOSIS — R918 Other nonspecific abnormal finding of lung field: Secondary | ICD-10-CM | POA: Diagnosis not present

## 2023-05-01 DIAGNOSIS — F5102 Adjustment insomnia: Secondary | ICD-10-CM

## 2023-05-01 MED ORDER — ESZOPICLONE 1 MG PO TABS: 1.0000 mg | ORAL_TABLET | Freq: Every evening | ORAL | 5 refills | Status: DC | PRN

## 2023-05-01 NOTE — Assessment & Plan Note (Signed)
 Resolved on repeat imaging 11/2022. Never smoker. No need for dedicated follow up.

## 2023-05-01 NOTE — Patient Instructions (Addendum)
 Continue to use CPAP every night, minimum of 4-6 hours a night.  Change equipment as directed. Wash your tubing with warm soap and water  daily, hang to dry. Wash humidifier portion weekly. Use bottled, distilled water  and change daily Be aware of reduced alertness and do not drive or operate heavy machinery if experiencing this or drowsiness.  Exercise encouraged, as tolerated. Healthy weight management discussed.  Avoid or decrease alcohol consumption and medications that make you more sleepy, if possible. Notify if persistent daytime sleepiness occurs even with consistent use of PAP therapy.   Continue Lunesta  1-2 mg At bedtime as needed for sleep. Do not drive after taking. Take within 30 minutes of intended sleep onset. Plan for 7-8 hours in the bed after taking. Stop medication and notify if any changes in sleep habits or mood occur. Make sure to apply your CPAP within 10 minutes of taking to avoid falling asleep without it, as this medication can make your sleep apnea worse without CPAP.   Follow up in 3-4 months with Dr. Baldwin Levee or Gina Lagos, or sooner, if needed

## 2023-05-01 NOTE — Assessment & Plan Note (Signed)
 Moderate OSA on CPAP. Excellent compliance and control. Significant improvement in leaks/control with nasal pillow mask and tightening pressure settings. Receives benefit from use. Understands proper use/care of device. Aware of risks of untreated OSA. Safe driving practices.   Patient Instructions  Continue to use CPAP every night, minimum of 4-6 hours a night.  Change equipment as directed. Wash your tubing with warm soap and water  daily, hang to dry. Wash humidifier portion weekly. Use bottled, distilled water  and change daily Be aware of reduced alertness and do not drive or operate heavy machinery if experiencing this or drowsiness.  Exercise encouraged, as tolerated. Healthy weight management discussed.  Avoid or decrease alcohol consumption and medications that make you more sleepy, if possible. Notify if persistent daytime sleepiness occurs even with consistent use of PAP therapy.   Adjust CPAP settings to 8-13 cmH2O   Mask fitting at [facility] - Have them try you on a nasal pillow mask or nasal cradle mask to see how you like this   Continue Lunesta  1-2 mg At bedtime as needed for sleep. Do not drive after taking. Take within 30 minutes of intended sleep onset. Plan for 7-8 hours in the bed after taking. Stop medication and notify if any changes in sleep habits or mood occur. Make sure to apply your CPAP within 10 minutes of taking to avoid falling asleep without it, as this medication can make your sleep apnea worse without CPAP.   Follow up in 6-8 weeks with Katie Jaece Ducharme,NP, or sooner, if needed

## 2023-05-01 NOTE — Progress Notes (Signed)
 Patient ID: Rebekah [name], female     DOB: 09-26-1955, 68 y.o.      MRN: [id number]  Chief Complaint  Patient presents with   Follow-up    OSA F/U visit    Virtual Visit via Video Note  I connected with Rebekah [name] on 05/01/23 at  9:00 AM EST by a video enabled telemedicine application and verified that I am speaking with the correct person using two identifiers.  Location: Patient: Home Provider: Office    I discussed the limitations of evaluation and management by telemedicine and the availability of in person appointments. The patient expressed understanding and agreed to proceed.  History of Present Illness: 68 year old female, never smoker followed for lung nodules and OSA on CPAP.  She is a patient Dr. Lacinda Pica and last seen in office 02/27/2023.  Past medical history significant for A-fib on diltiazem  and Eliquis , hypertension, PAH, hypothyroid, HLD, obesity.   TEST/EVENTS:  10/08/2022 echo: EF 65 to 70%.  RV size and function is normal.  Normal PASP.  Trivial MR.  Trivial AR.  Aortic dilatation. 11/20/2022 HST: AHI 20.2, SpO2 low 54% 12/03/2022 super D CT chest: Atherosclerosis.  Enlarged pulmonic trunk.  No suspicious pulmonary nodules.   12/07/2022: OV with Dr. Baldwin Levee.  Seen in June for abnormal CT of the chest that showed some scattered patchy groundglass opacities, scattered solid and semisolid nodules including 8 mm mixed density nodule in the left upper lobe, 4 mm apical upper lobe nodules and 7 mm right upper lobe nodule.  Has a history of latent TB that was treated.  Repeat CT from August 2024 shows possible left upper lobe nodule but the other nodules appear resolved.  CT also showed enlarged RV and dilated main PA suggestive of possible pulmonary hypertension.  PSG was ordered given her cardiac history and findings on CT imaging to rule out underlying OSA.  She had a home sleep study that revealed moderate sleep apnea.  Willing to try CPAP -orders placed for auto CPAP 8-20  cmH2O.   02/27/2023: OV with Alexus Michael NP for follow-up after starting on CPAP with her husband.  She has been wearing her CPAP most nights.  Unfortunately she is having a lot of difficulties with it.  She feels like she does not ever sleep when she is wearing it.  Tends to only get about 2 to 3 hours a night and then she is awake or tossing and turning the rest of the night.  She does keep the CPAP on even when she is awake.  She did not wear it the other night and slept for 8-1/2 hours, which she has not done in quite some time.  Feels like she is just is tired, if not more tired than when she started on therapy.  She does have a lot of issues with mask fit.  She has tried multiple different fullface masks.  The DME company told her that she would not be able to do a nasal mask but did not necessarily provide her with a reason other than it would not work for her.  She is currently waiting on a [employer] full facemask and size medium, which has already been mailed to her.  Feels like sometimes the pressures blow too hard.  She does have a daughter and friends who are on CPAP, who get a lot of benefit from use.  She wishes that she would feel this way.  She does have arthritis pains so tends to sleep in  a recliner. She denies any sleep parasomnia/paralysis.  No issues with drowsy driving. 01/27/2023-02/25/2023: CPAP auto to 8-20 cmH2O 27/30 days; 87% >4 hr; average use 6 hours 47 minutes Pressure 95th 13.2 Leaks 95th 20.7 AHI 3.9  05/01/2023: Today - follow up Patient presents today for follow up via video visit. We had started her on Lunesta  at our last visit because she was having issues with sleep maintenance. She started this at 1 mg and went up to 2 mg. Feels like she would do better on 1.5 mg. She did feel like it helped her get back to sleep and have more restful sleep compared to when she doesn't take it. She had some concerns that were brought up by a geriatric provider she is close with regarding the  sleep aid. We discussed this in a previous MyChart encounter. She feels comfortable using it now but understands to monitor for grogginess and use caution when getting up at night to avoid falls. She also doesn't like to use it every night but overall, things are going okay with it. No significant morning grogginess.  No sleep parasomnias.   Regarding her CPAP, she did go for the mask fitting and switched to a nasal pillow. She likes this better than the full face. Feels like it fits well. We had also adjusted her pressure settings. With these changes, her leakage is minimal and she has better control. She did recently have norovirus. Feeling tired from this but slowly recovering. Denies any drowsy driving or morning headaches.  03/31/2023-04/29/2023: CPAP 8-13 cmH2O 28/30 days; 93% >4 hr; average use 6 hr 32 min Pressure 95th 12.3 Leaks 95th 3.7 AHI 0.9  Allergies  Allergen Reactions   Beef-Derived Drug Products Anaphylaxis    After tick bite, cannot eat beef, pork or lamb   Lambs Quarters Anaphylaxis    After tick bite cannot eat beef, pork or lamb   Mucinex [Guaifenesin Er] Anaphylaxis   Pork-Derived Products Anaphylaxis    After tick bite cannot eat beef, pork or lamb   Darvon [Propoxyphene] Other (See Comments)    Hallucinations    Ace Inhibitors Swelling and Cough    Pedal Edema   Hydrocodone Itching   Ppd [Tuberculin Purified Protein Derivative]     Always has positive testing to PPD, do not use   Hydromet [Hydrocodone Bit-Homatrop Mbr] Itching and Other (See Comments)    Severe stomach pain-face itches.    Sulfonamide Derivatives Rash   Immunization History  Administered Date(s) Administered   Fluad Quad(high Dose 65+) 12/25/2018   Fluad Trivalent(High Dose 65+) 01/01/2023   Influenza Split 02/15/2012   Influenza,inj,Quad PF,6+ Mos 01/06/2014, 01/30/2017, 01/26/2018   Influenza-Unspecified 01/26/2018, 02/08/2020   Moderna Covid-19 Fall Seasonal Vaccine 19yrs & older  01/01/2023   PFIZER(Purple Top)SARS-COV-2 Vaccination 08/17/2019, 09/07/2019, 02/08/2020   PNEUMOCOCCAL CONJUGATE-20 05/17/2021   Zoster Recombinant(Shingrix) 11/14/2020, 05/17/2021   Zoster, Live 11/24/2015   Past Medical History:  Diagnosis Date   Allergy    Multiple   Anxiety    Arthritis    Atrial fibrillation with rapid ventricular response (HCC) 12/26/2021   Atrial fibrillation with RVR (HCC) 12/27/2021   Bilateral bunions    Cataract    Both eyes- lenses replaced   Chronic kidney disease 2022   Not kidney disease but kidney stone   Complication of anesthesia    Depression    More anxiety-worry-stress   Difficulty sleeping    prior sleep study did not reveal sleep apnea per patient   DJD (  degenerative joint disease)    Dyspnea    Dysrhythmia    a-fib   Environmental allergies    Food allergy    Gallbladder problem    GERD (gastroesophageal reflux disease)    Heart murmur    History of kidney stones    Hypertension    no meds after weight loss   Hypothyroidism    Incontinence of urine    at nite    Joint pain    Left atrial dilatation    Left foot pain    Leg edema    Obesity    s/p gastric sleeve 12/2013 (previously weighed close to 400 lbs)   Obstructive sleep apnea 12/07/2022   Osteoarthritis    Oxygen deficiency    3L/min for sleep due to slowed reaperations during sleep   Palpitations    Paroxysmal atrial fibrillation (HCC)    Pneumonia    hx of    PONV (postoperative nausea and vomiting)    Status post bilateral knee replacements    Supplemental oxygen dependent    uses 2l/[place] at night, states HR goes low and O2 drops   Tuberculosis    had 6 month of INH due to exposure    Vaginal vault prolapse     Tobacco History: Social History   Tobacco Use  Smoking Status Never   Passive exposure: Never  Smokeless Tobacco Never  Tobacco Comments   Tried 1-2 cigarettes as a child. Nothing else.   Counseling given: Not Answered Tobacco comments:  Tried 1-2 cigarettes as a child. Nothing else.   Outpatient Medications Prior to Visit  Medication Sig Dispense Refill   acetaminophen  (TYLENOL ) 500 MG tablet Take 1,000 mg by mouth every 6 (six) hours as needed for moderate pain or headache.     busPIRone  (BUSPAR ) 10 MG tablet Take 2 tablets (20 mg total) by mouth 2 (two) times daily. 360 tablet 0   diltiazem  (CARDIZEM  CD) 120 MG 24 hr capsule Take 1 capsule (120 mg total) by mouth at bedtime. 90 capsule 3   diltiazem  (CARDIZEM  CD) 240 MG 24 hr capsule TAKE ONE CAPSULE BY MOUTH EVERY DAY 90 capsule 3   diltiazem  (CARDIZEM ) 30 MG tablet TAKE ONE TO TWO TABLETS BY MOUTH EVERY 6 HOURS AS NEEDED FOR FAST HEART RATES 30 tablet 9   ELIQUIS  5 MG TABS tablet TAKE ONE TABLET BY MOUTH TWICE DAILY 180 tablet 1   EPINEPHRINE  0.3 mg/0.3 mL IJ SOAJ injection inject 0.3mls into the muscle once as needed for anaphylaxis 2 each 3   eszopiclone  (LUNESTA ) 1 MG TABS tablet Take 1 tablet (1 mg total) by mouth at bedtime as needed for sleep. Take immediately before bedtime 30 tablet 0   flecainide  (TAMBOCOR ) 50 MG tablet Take 1 tablet (50 mg total) by mouth 2 (two) times daily. 180 tablet 3   furosemide  (LASIX ) 20 MG tablet TAKE 1 TABLET BY MOUTH ONCE DAILY (Patient taking differently: Take 20 mg by mouth daily as needed. TAKE 1 TABLET BY MOUTH ONCE DAILY) 90 tablet 3   hydroxychloroquine (PLAQUENIL) 200 MG tablet Take 200 mg by mouth 2 (two) times daily.     levothyroxine  (SYNTHROID ) 50 MCG tablet TAKE ONE TABLET BY MOUTH DAILY BEFORE BREAKFAST 90 tablet 1   sertraline  (ZOLOFT ) 50 MG tablet Take 1 tablet (50 mg total) by mouth daily. 90 tablet 3   telmisartan  (MICARDIS ) 80 MG tablet Take 1 tablet (80 mg total) by mouth daily. 90 tablet 3   No facility-administered  medications prior to visit.     Review of Systems:   Constitutional: No weight loss or gain, night sweats, fevers, chills, or lassitude. +fatigue  HEENT: No headaches, difficulty swallowing,  tooth/dental problems, or sore throat. No sneezing, itching, ear ache, nasal congestion, or post nasal drip CV:  No chest pain, orthopnea, PND, swelling in lower extremities, anasarca, dizziness, palpitations, syncope Resp: +baseline shortness of breath with exertion or at rest. No excess mucus or change in color of mucus. No productive or non-productive. No hemoptysis. No wheezing.  No chest wall deformity GI:  +resolved vomiting/diarrhea; appetite improving. No heartburn, indigestion, abdominal pain, change in bowel habits, bloody stools.  MSK:  No joint pain or swelling.  Neuro: No dizziness or lightheadedness.  Psych: No depression or anxiety. Mood stable. +sleep disturbance (improved)  Observations/Objective: Patient is well-developed, well-nourished in no acute distress. A&Ox3. Resting comfortably at home. Unlabored breathing. Speech is clear and coherent with logical content.   Assessment and Plan: Obstructive sleep apnea Moderate OSA on CPAP. Excellent compliance and control. Significant improvement in leaks/control with nasal pillow mask and tightening pressure settings. Receives benefit from use. Understands proper use/care of device. Aware of risks of untreated OSA. Safe driving practices.   Patient Instructions  Continue to use CPAP every night, minimum of 4-6 hours a night.  Change equipment as directed. Wash your tubing with warm soap and water  daily, hang to dry. Wash humidifier portion weekly. Use bottled, distilled water  and change daily Be aware of reduced alertness and do not drive or operate heavy machinery if experiencing this or drowsiness.  Exercise encouraged, as tolerated. Healthy weight management discussed.  Avoid or decrease alcohol consumption and medications that make you more sleepy, if possible. Notify if persistent daytime sleepiness occurs even with consistent use of PAP therapy.   Adjust CPAP settings to 8-13 cmH2O   Mask fitting at [facility] - Have them  try you on a nasal pillow mask or nasal cradle mask to see how you like this   Continue Lunesta  1-2 mg At bedtime as needed for sleep. Do not drive after taking. Take within 30 minutes of intended sleep onset. Plan for 7-8 hours in the bed after taking. Stop medication and notify if any changes in sleep habits or mood occur. Make sure to apply your CPAP within 10 minutes of taking to avoid falling asleep without it, as this medication can make your sleep apnea worse without CPAP.   Follow up in 6-8 weeks with Alston Jerry Teresa Nicodemus,NP, or sooner, if needed    Insomnia Seems to have improved with CPAP change and utilizing Lunesta  PRN. Understands side effect/risk profile of Lunesta . Will send refill. Sleep hygiene reviewed.   Lung nodules Resolved on repeat imaging 11/2022. Never smoker. No need for dedicated follow up.     I discussed the assessment and treatment plan with the patient. The patient was provided an opportunity to ask questions and all were answered. The patient agreed with the plan and demonstrated an understanding of the instructions.   The patient was advised to call back or seek an in-person evaluation if the symptoms worsen or if the condition fails to improve as anticipated.  I provided 25 minutes of non-face-to-face time during this encounter.   Roetta Clarke, NP

## 2023-05-01 NOTE — Assessment & Plan Note (Signed)
 Seems to have improved with CPAP change and utilizing Lunesta  PRN. Understands side effect/risk profile of Lunesta . Will send refill. Sleep hygiene reviewed.

## 2023-05-10 DIAGNOSIS — G4733 Obstructive sleep apnea (adult) (pediatric): Secondary | ICD-10-CM | POA: Diagnosis not present

## 2023-05-16 DIAGNOSIS — G4733 Obstructive sleep apnea (adult) (pediatric): Secondary | ICD-10-CM | POA: Diagnosis not present

## 2023-05-17 ENCOUNTER — Other Ambulatory Visit: Payer: Self-pay | Admitting: Family Medicine

## 2023-05-17 DIAGNOSIS — E034 Atrophy of thyroid (acquired): Secondary | ICD-10-CM

## 2023-05-23 DIAGNOSIS — G4733 Obstructive sleep apnea (adult) (pediatric): Secondary | ICD-10-CM | POA: Diagnosis not present

## 2023-05-30 ENCOUNTER — Ambulatory Visit: Payer: Medicare PPO | Admitting: Nurse Practitioner

## 2023-06-04 ENCOUNTER — Other Ambulatory Visit: Payer: Medicare PPO

## 2023-06-06 ENCOUNTER — Other Ambulatory Visit: Payer: Medicare PPO

## 2023-06-06 DIAGNOSIS — D631 Anemia in chronic kidney disease: Secondary | ICD-10-CM | POA: Diagnosis not present

## 2023-06-06 DIAGNOSIS — R809 Proteinuria, unspecified: Secondary | ICD-10-CM | POA: Diagnosis not present

## 2023-06-06 DIAGNOSIS — N189 Chronic kidney disease, unspecified: Secondary | ICD-10-CM | POA: Diagnosis not present

## 2023-06-11 LAB — LAB REPORT - SCANNED
A1c: 5.8
HM HIV Screening: NEGATIVE
HM Hepatitis Screen: NEGATIVE

## 2023-06-14 ENCOUNTER — Other Ambulatory Visit: Payer: Self-pay | Admitting: Internal Medicine

## 2023-06-14 DIAGNOSIS — I480 Paroxysmal atrial fibrillation: Secondary | ICD-10-CM

## 2023-06-14 DIAGNOSIS — G4733 Obstructive sleep apnea (adult) (pediatric): Secondary | ICD-10-CM | POA: Diagnosis not present

## 2023-06-14 NOTE — Telephone Encounter (Signed)
 Prescription refill request for Eliquis received. Indication:afib Last office visit:12/24 Scr:1.30  10/24 Age: 68 Weight:128.8  kg  Prescription refilled

## 2023-06-16 ENCOUNTER — Encounter: Payer: Self-pay | Admitting: Internal Medicine

## 2023-06-18 DIAGNOSIS — M154 Erosive (osteo)arthritis: Secondary | ICD-10-CM | POA: Diagnosis not present

## 2023-06-18 DIAGNOSIS — M5136 Other intervertebral disc degeneration, lumbar region with discogenic back pain only: Secondary | ICD-10-CM | POA: Diagnosis not present

## 2023-06-18 DIAGNOSIS — M064 Inflammatory polyarthropathy: Secondary | ICD-10-CM | POA: Diagnosis not present

## 2023-06-18 DIAGNOSIS — Z6841 Body Mass Index (BMI) 40.0 and over, adult: Secondary | ICD-10-CM | POA: Diagnosis not present

## 2023-06-20 DIAGNOSIS — I5032 Chronic diastolic (congestive) heart failure: Secondary | ICD-10-CM | POA: Diagnosis not present

## 2023-06-20 DIAGNOSIS — I129 Hypertensive chronic kidney disease with stage 1 through stage 4 chronic kidney disease, or unspecified chronic kidney disease: Secondary | ICD-10-CM | POA: Diagnosis not present

## 2023-06-20 DIAGNOSIS — G4733 Obstructive sleep apnea (adult) (pediatric): Secondary | ICD-10-CM | POA: Diagnosis not present

## 2023-06-20 DIAGNOSIS — N1831 Chronic kidney disease, stage 3a: Secondary | ICD-10-CM | POA: Diagnosis not present

## 2023-06-26 ENCOUNTER — Telehealth: Payer: Self-pay

## 2023-06-26 ENCOUNTER — Encounter: Payer: Self-pay | Admitting: Family Medicine

## 2023-06-26 ENCOUNTER — Telehealth: Admitting: Family Medicine

## 2023-06-26 DIAGNOSIS — B9689 Other specified bacterial agents as the cause of diseases classified elsewhere: Secondary | ICD-10-CM | POA: Diagnosis not present

## 2023-06-26 DIAGNOSIS — J019 Acute sinusitis, unspecified: Secondary | ICD-10-CM

## 2023-06-26 MED ORDER — CEFDINIR 300 MG PO CAPS: 300.0000 mg | ORAL_CAPSULE | Freq: Two times a day (BID) | ORAL | 0 refills | Status: DC

## 2023-06-26 MED ORDER — BENZONATATE 100 MG PO CAPS: 100.0000 mg | ORAL_CAPSULE | Freq: Three times a day (TID) | ORAL | 0 refills | Status: DC | PRN

## 2023-06-26 MED ORDER — FLUTICASONE PROPIONATE 50 MCG/ACT NA SUSP: 2.0000 | Freq: Every day | NASAL | 6 refills | Status: DC

## 2023-06-26 MED ORDER — CEFDINIR 250 MG/5ML PO SUSR: 300.0000 mg | Freq: Two times a day (BID) | ORAL | 0 refills | Status: AC

## 2023-06-26 NOTE — Telephone Encounter (Signed)
 Copied from CRM [id number]. Topic: Clinical - Prescription Issue >> Jun 26, 2023  3:12 PM Turkey B wrote: Reason for CRM: pt called back in again, about med, cefdinir (OMNICEF) 300 MG capsule, needs to be changed to liquid because she would be allergic to the pill form

## 2023-06-26 NOTE — Telephone Encounter (Signed)
 Copied from CRM [id number]. Topic: Clinical - Prescription Issue >> Jun 26, 2023  3:00 PM Higinio Roger wrote: Reason for CRM: Patient needs cefdinir (OMNICEF) 300 MG capsule changed to liquid version due to the capsules having Bovine in it.  Preferred pharmacy: [facility] [address] [place], [place] [address] [phone]

## 2023-06-26 NOTE — Progress Notes (Signed)
 MyChart Video visit  Subjective: CC:URI PCP: Rebekah Ip, DO ONG:EXBM Rebekah [name] is a 68 y.o. female. Patient provides verbal consent for consult held via video.  Due to COVID-19 pandemic this visit was conducted virtually. This visit type was conducted due to national recommendations for restrictions regarding the COVID-19 Pandemic (Rebekah.g. social distancing, sheltering in place) in an effort to limit this patient's exposure and mitigate transmission in our community. All issues noted in this document were discussed and addressed.  A physical exam was not performed with this format.   Location of patient: home Location of provider: [facility] Others present for call: daughter  1. URI She reports she started having drainage over the last few weeks.  She notes it progressed after she to visit her daughter.  She reports cough/ scratchy throat/ hoarseness/ sore throat.  She has been drinking hot tea.  She reports it got worse on Fri/ Saturday.  Using Coricidin.  She did pseudofed yesterday. Not using any nasal sprays.  No SOB, wheezing, hemoptysis.  She reports green sputum, which is a change from clear a few days ago.  No myalgia/ fevers.  ROS: Per HPI  Allergies  Allergen Reactions   Beef-Derived Drug Products Anaphylaxis    After tick bite, cannot eat beef, pork or lamb   Lambs Quarters Anaphylaxis    After tick bite cannot eat beef, pork or lamb   Mucinex [Guaifenesin Er] Anaphylaxis   Pork-Derived Products Anaphylaxis    After tick bite cannot eat beef, pork or lamb   Darvon [Propoxyphene] Other (See Comments)    Hallucinations    Ace Inhibitors Swelling and Cough    Pedal Edema   Hydrocodone Itching   Ppd [Tuberculin Purified Protein Derivative]     Always has positive testing to PPD, do not use   Hydromet [Hydrocodone Bit-Homatrop Mbr] Itching and Other (See Comments)    Severe stomach pain-face itches.    Sulfonamide Derivatives Rash   Past Medical History:  Diagnosis Date    Allergy    Multiple   Anxiety    Arthritis    Atrial fibrillation with rapid ventricular response (HCC) 12/26/2021   Atrial fibrillation with RVR (HCC) 12/27/2021   Bilateral bunions    Cataract    Both eyes- lenses replaced   Chronic kidney disease 2022   Not kidney disease but kidney stone   Complication of anesthesia    Depression    More anxiety-worry-stress   Difficulty sleeping    prior sleep study did not reveal sleep apnea per patient   DJD (degenerative joint disease)    Dyspnea    Dysrhythmia    a-fib   Environmental allergies    Food allergy    Gallbladder problem    GERD (gastroesophageal reflux disease)    Heart murmur    History of kidney stones    Hypertension    no meds after weight loss   Hypothyroidism    Incontinence of urine    at nite    Joint pain    Left atrial dilatation    Left foot pain    Leg edema    Obesity    s/p gastric sleeve 12/2013 (previously weighed close to 400 lbs)   Obstructive sleep apnea 12/07/2022   Osteoarthritis    Oxygen deficiency    3L/min for sleep due to slowed reaperations during sleep   Palpitations    Paroxysmal atrial fibrillation (HCC)    Pneumonia    hx of  PONV (postoperative nausea and vomiting)    Status post bilateral knee replacements    Supplemental oxygen dependent    uses 2l/[place] at night, states HR goes low and O2 drops   Tuberculosis    had 6 month of INH due to exposure    Vaginal vault prolapse     Current Outpatient Medications:    acetaminophen (TYLENOL) 500 MG tablet, Take 1,000 mg by mouth every 6 (six) hours as needed for moderate pain or headache., Disp: , Rfl:    busPIRone (BUSPAR) 10 MG tablet, Take 2 tablets (20 mg total) by mouth 2 (two) times daily., Disp: 360 tablet, Rfl: 0   diltiazem (CARDIZEM CD) 120 MG 24 hr capsule, Take 1 capsule (120 mg total) by mouth at bedtime., Disp: 90 capsule, Rfl: 3   diltiazem (CARDIZEM CD) 240 MG 24 hr capsule, TAKE ONE CAPSULE BY MOUTH EVERY DAY,  Disp: 90 capsule, Rfl: 3   diltiazem (CARDIZEM) 30 MG tablet, TAKE ONE TO TWO TABLETS BY MOUTH EVERY 6 HOURS AS NEEDED FOR FAST HEART RATES, Disp: 30 tablet, Rfl: 9   ELIQUIS 5 MG TABS tablet, TAKE ONE TABLET BY MOUTH TWICE DAILY, Disp: 180 tablet, Rfl: 1   EPINEPHRINE 0.3 mg/0.3 mL IJ SOAJ injection, inject 0.28mls into the muscle once as needed for anaphylaxis, Disp: 2 each, Rfl: 3   eszopiclone (LUNESTA) 1 MG TABS tablet, Take 1-2 tablets (1-2 mg total) by mouth at bedtime as needed for sleep. Take immediately before bedtime, Disp: 45 tablet, Rfl: 5   flecainide (TAMBOCOR) 50 MG tablet, Take 1 tablet (50 mg total) by mouth 2 (two) times daily., Disp: 180 tablet, Rfl: 3   furosemide (LASIX) 20 MG tablet, TAKE 1 TABLET BY MOUTH ONCE DAILY (Patient taking differently: Take 20 mg by mouth daily as needed. TAKE 1 TABLET BY MOUTH ONCE DAILY), Disp: 90 tablet, Rfl: 3   hydroxychloroquine (PLAQUENIL) 200 MG tablet, Take 200 mg by mouth 2 (two) times daily., Disp: , Rfl:    levothyroxine (SYNTHROID) 50 MCG tablet, TAKE ONE TABLET BY MOUTH DAILY BEFORE BREAKFAST, Disp: 90 tablet, Rfl: 0   sertraline (ZOLOFT) 50 MG tablet, Take 1 tablet (50 mg total) by mouth daily., Disp: 90 tablet, Rfl: 3   telmisartan (MICARDIS) 80 MG tablet, Take 1 tablet (80 mg total) by mouth daily., Disp: 90 tablet, Rfl: 3  Gen: nontoxic female, NAD Heent: no gross rhinorrhea or facial swelling. No conjunctival injection Pulm: intermittently coughing. No wheezing or labored breathing  Assessment/ Plan: 68 y.o. female   Acute bacterial sinusitis - Plan: cefdinir (OMNICEF) 300 MG capsule, fluticasone (FLONASE) 50 MCG/ACT nasal spray, benzonatate (TESSALON PERLES) 100 MG capsule  Presumed bacterial sinusitis after viral versus allergic rhinosinusitis.  Omnicef ordered.  Flonase ordered.  Tessalon Perles ordered.  Discussed red flag signs and symptoms warranting further evaluation and she voiced good understanding.  Follow-up as  needed  Start time: 12:07p End time: 12:18pm  Total time spent on patient care (including video visit/ documentation): 15 minutes  Rebekah [name] Rebekah Skains, DO [facility] [phone]

## 2023-06-26 NOTE — Telephone Encounter (Signed)
 Copied from CRM [id number]. Topic: Clinical - Medical Advice >> Jun 26, 2023  9:09 AM Mayer Masker wrote: Reason for CRM: Patient is currently in [place] and she stated she may have a possible Sinus infection patient is requesting call back

## 2023-06-26 NOTE — Telephone Encounter (Signed)
 Pt had apt with pcp today

## 2023-07-01 ENCOUNTER — Other Ambulatory Visit: Payer: Self-pay | Admitting: Internal Medicine

## 2023-07-02 ENCOUNTER — Other Ambulatory Visit: Payer: Self-pay

## 2023-07-02 MED ORDER — FLECAINIDE ACETATE 50 MG PO TABS: 50.0000 mg | ORAL_TABLET | Freq: Two times a day (BID) | ORAL | 2 refills | Status: DC

## 2023-07-11 ENCOUNTER — Ambulatory Visit (HOSPITAL_COMMUNITY): Payer: Medicare PPO | Admitting: Physician Assistant

## 2023-07-14 DIAGNOSIS — G4733 Obstructive sleep apnea (adult) (pediatric): Secondary | ICD-10-CM | POA: Diagnosis not present

## 2023-07-15 ENCOUNTER — Encounter: Payer: Self-pay | Admitting: Family Medicine

## 2023-07-15 ENCOUNTER — Ambulatory Visit (INDEPENDENT_AMBULATORY_CARE_PROVIDER_SITE_OTHER): Payer: Medicare PPO | Admitting: Family Medicine

## 2023-07-15 ENCOUNTER — Ambulatory Visit (INDEPENDENT_AMBULATORY_CARE_PROVIDER_SITE_OTHER): Payer: Medicare PPO

## 2023-07-15 VITALS — BP 138/78 | HR 82 | Temp 98.7°F | Ht 61.0 in | Wt 288.0 lb

## 2023-07-15 DIAGNOSIS — T782XXD Anaphylactic shock, unspecified, subsequent encounter: Secondary | ICD-10-CM

## 2023-07-15 DIAGNOSIS — Z780 Asymptomatic menopausal state: Secondary | ICD-10-CM

## 2023-07-15 DIAGNOSIS — I10 Essential (primary) hypertension: Secondary | ICD-10-CM | POA: Diagnosis not present

## 2023-07-15 DIAGNOSIS — E785 Hyperlipidemia, unspecified: Secondary | ICD-10-CM | POA: Diagnosis not present

## 2023-07-15 DIAGNOSIS — E034 Atrophy of thyroid (acquired): Secondary | ICD-10-CM

## 2023-07-15 DIAGNOSIS — Z23 Encounter for immunization: Secondary | ICD-10-CM

## 2023-07-15 DIAGNOSIS — I480 Paroxysmal atrial fibrillation: Secondary | ICD-10-CM

## 2023-07-15 DIAGNOSIS — B078 Other viral warts: Secondary | ICD-10-CM

## 2023-07-15 DIAGNOSIS — Z6841 Body Mass Index (BMI) 40.0 and over, adult: Secondary | ICD-10-CM

## 2023-07-15 DIAGNOSIS — L309 Dermatitis, unspecified: Secondary | ICD-10-CM

## 2023-07-15 DIAGNOSIS — Z0001 Encounter for general adult medical examination with abnormal findings: Secondary | ICD-10-CM | POA: Diagnosis not present

## 2023-07-15 DIAGNOSIS — G4733 Obstructive sleep apnea (adult) (pediatric): Secondary | ICD-10-CM | POA: Diagnosis not present

## 2023-07-15 DIAGNOSIS — I129 Hypertensive chronic kidney disease with stage 1 through stage 4 chronic kidney disease, or unspecified chronic kidney disease: Secondary | ICD-10-CM

## 2023-07-15 DIAGNOSIS — Z9884 Bariatric surgery status: Secondary | ICD-10-CM

## 2023-07-15 DIAGNOSIS — N1831 Chronic kidney disease, stage 3a: Secondary | ICD-10-CM | POA: Diagnosis not present

## 2023-07-15 DIAGNOSIS — Z0000 Encounter for general adult medical examination without abnormal findings: Secondary | ICD-10-CM

## 2023-07-15 DIAGNOSIS — F32A Depression, unspecified: Secondary | ICD-10-CM

## 2023-07-15 LAB — LIPID PANEL

## 2023-07-15 MED ORDER — ZEPBOUND 7.5 MG/0.5ML ~~LOC~~ SOAJ: 7.5000 mg | SUBCUTANEOUS | 0 refills | Status: DC

## 2023-07-15 MED ORDER — TRIAMCINOLONE ACETONIDE 0.1 % EX CREA: 1.0000 | TOPICAL_CREAM | Freq: Two times a day (BID) | CUTANEOUS | 0 refills | Status: AC | PRN

## 2023-07-15 MED ORDER — ZEPBOUND 10 MG/0.5ML ~~LOC~~ SOAJ
10.0000 mg | SUBCUTANEOUS | 0 refills | Status: DC
Start: 2023-07-15 — End: 2023-11-01

## 2023-07-15 MED ORDER — ZEPBOUND 5 MG/0.5ML ~~LOC~~ SOAJ: 5.0000 mg | SUBCUTANEOUS | 0 refills | Status: DC

## 2023-07-15 MED ORDER — ZEPBOUND 2.5 MG/0.5ML ~~LOC~~ SOAJ: 2.5000 mg | SUBCUTANEOUS | 0 refills | Status: DC

## 2023-07-15 NOTE — Patient Instructions (Signed)
 Tips for success with Zepbound (and by success, how not to be super sick on your stomach): Eat small meals AVOID heavy foods (fried/ high in carbs like bread, pasta, rice) AVOID carbonated beverages (soda/ beer, as these can increase bloating) DOUBLE your water intake (will help you avoid constipation/ dehydration)  Zebound CAN cause: Nausea Abdominal pain Increased acid reflux (sometimes presents as "sour burps") Constipation OR Diarrhea Fatigue (especially when you first start it)

## 2023-07-15 NOTE — Progress Notes (Unsigned)
 Rebekah [name] is a 68 y.o. female presents to office today for annual physical exam examination.    Concerns today include: 1. ***  Occupation: ***, Marital status: ***, Substance use: *** Health Maintenance Due  Topic Date Due   Fecal DNA (Cologuard)  Never done   Medicare Annual Wellness (AWV)  02/17/2022   DEXA SCAN  05/13/2022   COVID-19 Vaccine (5 - Pfizer risk 2024-25 season) 07/01/2023   Refills needed today: ***  Immunization History  Administered Date(s) Administered   Fluad Quad(high Dose 65+) 12/25/2018   Fluad Trivalent(High Dose 65+) 01/01/2023   Influenza Split 02/15/2012   Influenza,inj,Quad PF,6+ Mos 01/06/2014, 01/30/2017, 01/26/2018   Influenza-Unspecified 01/26/2018, 02/08/2020   Moderna Covid-19 Fall Seasonal Vaccine 39yrs & older 01/01/2023   PFIZER(Purple Top)SARS-COV-2 Vaccination 08/17/2019, 09/07/2019, 02/08/2020   PNEUMOCOCCAL CONJUGATE-20 05/17/2021   Zoster Recombinant(Shingrix) 11/14/2020, 05/17/2021   Zoster, Live 11/24/2015   Past Medical History:  Diagnosis Date   Allergy    Multiple   Anxiety    Arthritis    Atrial fibrillation with rapid ventricular response (HCC) 12/26/2021   Atrial fibrillation with RVR (HCC) 12/27/2021   Bilateral bunions    Cataract    Both eyes- lenses replaced   Chronic kidney disease 2022   Not kidney disease but kidney stone   Complication of anesthesia    Depression    More anxiety-worry-stress   Difficulty sleeping    prior sleep study did not reveal sleep apnea per patient   DJD (degenerative joint disease)    Dyspnea    Dysrhythmia    a-fib   Environmental allergies    Food allergy    Gallbladder problem    GERD (gastroesophageal reflux disease)    Heart murmur    History of kidney stones    Hypertension    no meds after weight loss   Hypothyroidism    Incontinence of urine    at nite    Joint pain    Left atrial dilatation    Left foot pain    Leg edema    Obesity    s/p gastric sleeve  12/2013 (previously weighed close to 400 lbs)   Obstructive sleep apnea 12/07/2022   Osteoarthritis    Oxygen deficiency    3L/min for sleep due to slowed reaperations during sleep   Palpitations    Paroxysmal atrial fibrillation (HCC)    Pneumonia    hx of    PONV (postoperative nausea and vomiting)    Status post bilateral knee replacements    Supplemental oxygen dependent    uses 2l/[place] at night, states HR goes low and O2 drops   Tuberculosis    had 6 month of INH due to exposure    Vaginal vault prolapse    Social History   Socioeconomic History   Marital status: Married    Spouse name: [name]   Number of children: 2   Years of education: 16   Highest education level: Bachelor's degree (e.g., BA, AB, BS)  Occupational History   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 Nothing else.  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   Social Drivers of Health   Financial Resource Strain: Low Risk  (07/14/2023)   Overall Financial Resource Strain (CARDIA)    Difficulty of Paying Living Expenses: Not very hard  Food Insecurity: No Food Insecurity (07/14/2023)   Hunger Vital Sign    Worried About Running Out of Food in the Last Year: Never true    Ran Out of Food in the Last Year: Never true  Transportation Needs: No Transportation Needs (07/14/2023)   PRAPARE - Scientist, research (physical sciences) (Medical): No    Lack of Transportation (Non-Medical): No  Physical Activity: Inactive (07/14/2023)   Exercise Vital Sign    Days of Exercise per Week: 0 days    Minutes of Exercise per Session: 0 min  Stress: No Stress Concern Present (07/14/2023)   [employer] of Occupational Health - Occupational Stress Questionnaire    Feeling of Stress : Only a little  Social Connections: Socially Integrated (07/14/2023)   Social Connection and Isolation Panel [NHANES]    Frequency of Communication with Friends and Family: More than three times a week    Frequency of Social Gatherings with Friends and Family: More than three times a week    Attends Religious Services: 1 to 4 times per year    Active Member of [employer] or Organizations: No    Attends Engineer, structural: More than 4 times per year    Marital Status: Married  Catering manager Violence: Not At Risk (09/12/2022)   Humiliation, Afraid, Rape, and Kick questionnaire    Fear of Current or Ex-Partner: No    Emotionally Abused: No    Physically Abused: No    Sexually Abused: No   Past Surgical History:  Procedure Laterality Date   APPENDECTOMY     CHOLECYSTECTOMY     CYSTOSCOPY/URETEROSCOPY/HOLMIUM LASER/STENT PLACEMENT Left 09/29/2020   Procedure: CYSTOSCOPY WITH LEFT RETROGRADE PYELOGRAM AND LEFT URETEROSCOPY/HOLMIUM LASER STONE REMOVAL LEFT /STENT PLACEMENT;  Surgeon: Crist Fat, MD;  Location: [facility];  Service: Urology;  Laterality: Left;   CYSTOSCOPY/URETEROSCOPY/HOLMIUM LASER/STENT PLACEMENT Left 10/12/2022   Procedure: CYSTOSCOPY LEFT URETEROSCOPY, HOLMIUM LASER LITHOTRIPSY, LEFT URETERAL STENT EXCHANGE;  Surgeon: Crist Fat, MD;  Location: [facility];  Service: Urology;  Laterality: Left;   EYE SURGERY     03/2014 lens implant left lens    FOOT ARTHRODESIS Left 03/15/2016   Procedure: TALONAVICULAR AND SUBTALAR ARTHRODESIS;  Surgeon: Toni Arthurs, MD;  Location: [facility];   Service: Orthopedics;  Laterality: Left;   GASTROC RECESSION EXTREMITY Left 03/15/2016   Procedure: LEFT GASTROC RECESSION;  Surgeon: Toni Arthurs, MD;  Location: [facility];  Service: Orthopedics;  Laterality: Left;   JOINT REPLACEMENT  04/16/1997   L TOTAL KNEE   LAPAROSCOPIC GASTRIC SLEEVE RESECTION N/A 01/04/2014   Procedure: LAPAROSCOPIC GASTRIC SLEEVE RESECTION;  Surgeon: Gaynelle Adu, MD;  Location: [facility];  Service: General;  Laterality: N/A;   TONSILLECTOMY     TOTAL KNEE ARTHROPLASTY Right 05/17/2015   Procedure: RIGHT TOTAL KNEE ARTHROPLASTY;  Surgeon: Durene Romans, MD;  Location: [facility];  Service: Orthopedics;  Laterality: Right;  TRANSTHORACIC ECHOCARDIOGRAM  11/14/2012   EF 60-65%, mod LVH & mod conc hypertrophy, grade 1 diastolic dysfunction; LA mildly dilated; RV systolic pressure increased; PA peak pressure   TUBAL LIGATION     UPPER GI ENDOSCOPY  01/04/2014   Procedure: UPPER GI ENDOSCOPY;  Surgeon: Gaynelle Adu, MD;  Location: [facility];  Service: General;;   Family History  Problem Relation Age of Onset   Diabetes Father    Hyperlipidemia Father    Hypertension Father    Heart disease Father    Sudden death Father    Anxiety disorder Father    Obesity Father    Varicose Veins Father    Uterine cancer Mother    Cervical cancer Mother    Breast cancer Mother    Cancer Mother        cervical and brreast cancer   Hypertension Mother    Hyperlipidemia Mother    Obesity Mother    Arthritis Mother    Diabetes Maternal Grandmother    Obesity Maternal Grandmother    Diabetes Brother    Hypertension Brother    Stroke Brother    Hypertension Brother    Obesity Daughter     Current Outpatient Medications:    benzonatate (TESSALON PERLES) 100 MG capsule, Take 1 capsule (100 mg total) by mouth 3 (three) times daily as needed for cough., Disp: 20 capsule, Rfl: 0   busPIRone (BUSPAR) 10 MG tablet, Take 2 tablets (20 mg total) by mouth 2 (two) times  daily., Disp: 360 tablet, Rfl: 0   diltiazem (CARDIZEM CD) 120 MG 24 hr capsule, Take 1 capsule (120 mg total) by mouth at bedtime., Disp: 90 capsule, Rfl: 3   diltiazem (CARDIZEM CD) 240 MG 24 hr capsule, TAKE ONE CAPSULE BY MOUTH EVERY DAY, Disp: 90 capsule, Rfl: 3   diltiazem (CARDIZEM) 30 MG tablet, TAKE ONE TO TWO TABLETS BY MOUTH EVERY 6 HOURS AS NEEDED FOR FAST HEART RATES, Disp: 30 tablet, Rfl: 9   ELIQUIS 5 MG TABS tablet, TAKE ONE TABLET BY MOUTH TWICE DAILY, Disp: 180 tablet, Rfl: 1   EPINEPHRINE 0.3 mg/0.3 mL IJ SOAJ injection, inject 0.63mls into the muscle once as needed for anaphylaxis, Disp: 2 each, Rfl: 3   eszopiclone (LUNESTA) 1 MG TABS tablet, Take 1-2 tablets (1-2 mg total) by mouth at bedtime as needed for sleep. Take immediately before bedtime, Disp: 45 tablet, Rfl: 5   flecainide (TAMBOCOR) 50 MG tablet, Take 1 tablet (50 mg total) by mouth 2 (two) times daily., Disp: 180 tablet, Rfl: 2   fluticasone (FLONASE) 50 MCG/ACT nasal spray, Place 2 sprays into both nostrils daily., Disp: 16 g, Rfl: 6   furosemide (LASIX) 20 MG tablet, TAKE 1 TABLET BY MOUTH ONCE DAILY (Patient taking differently: Take 20 mg by mouth daily as needed. TAKE 1 TABLET BY MOUTH ONCE DAILY), Disp: 90 tablet, Rfl: 3   hydroxychloroquine (PLAQUENIL) 200 MG tablet, Take 200 mg by mouth 2 (two) times daily., Disp: , Rfl:    levothyroxine (SYNTHROID) 50 MCG tablet, TAKE ONE TABLET BY MOUTH DAILY BEFORE BREAKFAST, Disp: 90 tablet, Rfl: 0   sertraline (ZOLOFT) 50 MG tablet, Take 1 tablet (50 mg total) by mouth daily., Disp: 90 tablet, Rfl: 3   telmisartan (MICARDIS) 80 MG tablet, Take 1 tablet (80 mg total) by mouth daily., Disp: 90 tablet, Rfl: 3  Allergies  Allergen Reactions   Beef-Derived Drug Products Anaphylaxis    After tick bite, cannot eat beef, pork or lamb  Lambs Quarters Anaphylaxis    After tick bite cannot eat beef, pork or lamb   Mucinex [Guaifenesin Er] Anaphylaxis   Pork-Derived Products  Anaphylaxis    After tick bite cannot eat beef, pork or lamb   Darvon [Propoxyphene] Other (See Comments)    Hallucinations    Ace Inhibitors Swelling and Cough    Pedal Edema   Hydrocodone Itching   Ppd [Tuberculin Purified Protein Derivative]     Always has positive testing to PPD, do not use   Hydromet [Hydrocodone Bit-Homatrop Mbr] Itching and Other (See Comments)    Severe stomach pain-face itches.    Sulfonamide Derivatives Rash     ROS: Review of Systems {ros; complete:30496}    Physical exam {Exam, Complete:[phone]}      02/05/2023    9:11 AM 01/15/2023    9:29 AM 01/15/2023    9:25 AM  Depression screen PHQ 2/9  Decreased Interest 0 0 0  Down, Depressed, Hopeless 0 0 0  PHQ - 2 Score 0 0 0  Altered sleeping 3 3   Tired, decreased energy 1 1   Change in appetite 1 1   Feeling bad or failure about yourself  0 0   Trouble concentrating 0 0   Moving slowly or fidgety/restless 0 0   Suicidal thoughts 0 0   PHQ-9 Score 5 5   Difficult doing work/chores Not difficult at all Not difficult at all       02/05/2023    9:11 AM 01/15/2023    9:30 AM 10/09/2022    3:38 PM 07/04/2022    9:06 AM  GAD 7 : Generalized Anxiety Score  Nervous, Anxious, on Edge 0 1 1 1   Control/stop worrying 0 0 0 0  Worry too much - different things 0 0 1 1  Trouble relaxing 0 0 1 1  Restless 0 0 1 1  Easily annoyed or irritable 0 1 1 1   Afraid - awful might happen 0 1 0 0  Total GAD 7 Score 0 3 5 5   Anxiety Difficulty Not difficult at all Not difficult at all Somewhat difficult Somewhat difficult     Assessment/ Plan: Rebekah [name] here for annual physical exam.   Annual physical exam  CKD stage 3a, GFR 45-59 ml/min (HCC) - Plan: CMP14+EGFR, CANCELED: VITAMIN D 25 Hydroxy (Vit-D Deficiency, Fractures), CANCELED: CBC, CANCELED: Iron, TIBC and Ferritin Panel  Essential hypertension - Plan: CMP14+EGFR  Paroxysmal atrial fibrillation (HCC) - Plan: CMP14+EGFR  Hypothyroidism due  to acquired atrophy of thyroid - Plan: CMP14+EGFR, TSH + free T4  Dyslipidemia - Plan: CMP14+EGFR, Lipid Panel, TSH + free T4  Morbid obesity (HCC) - Plan: CANCELED: Bayer DCA Hb A1c Waived  S/P laparoscopic sleeve gastrectomy 01/04/14 - Plan: Vitamin B12, CANCELED: Bayer DCA Hb A1c Waived, CANCELED: VITAMIN D 25 Hydroxy (Vit-D Deficiency, Fractures), CANCELED: CBC, CANCELED: Iron, TIBC and Ferritin Panel  ***    Counseled on healthy lifestyle choices, including diet (rich in fruits, vegetables and lean meats and low in salt and simple carbohydrates) and exercise (at least 30 minutes of moderate physical activity daily).  Patient to follow up ***  Zell Doucette M. Nadine Counts, DO

## 2023-07-16 ENCOUNTER — Encounter: Payer: Self-pay | Admitting: Family Medicine

## 2023-07-16 DIAGNOSIS — M545 Low back pain, unspecified: Secondary | ICD-10-CM

## 2023-07-16 DIAGNOSIS — E785 Hyperlipidemia, unspecified: Secondary | ICD-10-CM

## 2023-07-16 DIAGNOSIS — E034 Atrophy of thyroid (acquired): Secondary | ICD-10-CM | POA: Insufficient documentation

## 2023-07-16 DIAGNOSIS — M85832 Other specified disorders of bone density and structure, left forearm: Secondary | ICD-10-CM | POA: Diagnosis not present

## 2023-07-16 DIAGNOSIS — Z780 Asymptomatic menopausal state: Secondary | ICD-10-CM | POA: Diagnosis not present

## 2023-07-16 LAB — CMP14+EGFR
ALT: 12 IU/L (ref 0–32)
AST: 20 IU/L (ref 0–40)
Albumin: 4.6 g/dL (ref 3.9–4.9)
Alkaline Phosphatase: 95 IU/L (ref 44–121)
BUN/Creatinine Ratio: 16 (ref 12–28)
BUN: 21 mg/dL (ref 8–27)
Bilirubin Total: 0.5 mg/dL (ref 0.0–1.2)
CO2: 24 mmol/L (ref 20–29)
Calcium: 10.7 mg/dL — ABNORMAL HIGH (ref 8.7–10.3)
Chloride: 101 mmol/L (ref 96–106)
Creatinine, Ser: 1.31 mg/dL — ABNORMAL HIGH (ref 0.57–1.00)
Globulin, Total: 3.3 g/dL (ref 1.5–4.5)
Glucose: 102 mg/dL — ABNORMAL HIGH (ref 70–99)
Potassium: 4.9 mmol/L (ref 3.5–5.2)
Sodium: 142 mmol/L (ref 134–144)
Total Protein: 7.9 g/dL (ref 6.0–8.5)
eGFR: 44 mL/min/{1.73_m2} — ABNORMAL LOW (ref >59)

## 2023-07-16 LAB — TSH+FREE T4
Free T4: 1.16 ng/dL (ref 0.82–1.77)
TSH: 2.65 u[IU]/mL (ref 0.450–4.500)

## 2023-07-16 LAB — LIPID PANEL
Cholesterol, Total: 192 mg/dL (ref 100–199)
HDL: 80 mg/dL (ref >39)
LDL CALC COMMENT:: 2.4 ratio (ref 0.0–4.4)
LDL Chol Calc (NIH): 89 mg/dL (ref 0–99)
Triglycerides: 133 mg/dL (ref 0–149)
VLDL Cholesterol Cal: 23 mg/dL (ref 5–40)

## 2023-07-16 LAB — VITAMIN B12: Vitamin B-12: 309 pg/mL (ref 232–1245)

## 2023-07-16 MED ORDER — METHOCARBAMOL 500 MG PO TABS
500.0000 mg | ORAL_TABLET | Freq: Three times a day (TID) | ORAL | 0 refills | Status: DC | PRN
Start: 2023-07-16 — End: 2023-08-01

## 2023-07-16 MED ORDER — TELMISARTAN 80 MG PO TABS: 80.0000 mg | ORAL_TABLET | Freq: Every day | ORAL | 3 refills | Status: DC

## 2023-07-16 MED ORDER — EPINEPHRINE 0.3 MG/0.3ML IJ SOAJ: 0.3000 mg | INTRAMUSCULAR | 3 refills | Status: DC | PRN

## 2023-07-16 MED ORDER — ROSUVASTATIN CALCIUM 10 MG PO TABS: 10.0000 mg | ORAL_TABLET | Freq: Every day | ORAL | 3 refills | Status: DC

## 2023-07-16 MED ORDER — LEVOTHYROXINE SODIUM 50 MCG PO TABS: ORAL_TABLET | ORAL | 0 refills | Status: DC

## 2023-07-16 MED ORDER — BUSPIRONE HCL 10 MG PO TABS: 20.0000 mg | ORAL_TABLET | Freq: Two times a day (BID) | ORAL | 0 refills | Status: DC

## 2023-07-16 MED ORDER — SERTRALINE HCL 50 MG PO TABS: 50.0000 mg | ORAL_TABLET | Freq: Every day | ORAL | 3 refills | Status: DC

## 2023-07-18 ENCOUNTER — Telehealth: Payer: Self-pay

## 2023-07-18 NOTE — Telephone Encounter (Signed)
 Pharmacy Patient Advocate Encounter   Received notification from Onbase that prior authorization for [facility] is required/requested.   Insurance verification completed.   The patient is insured through [place] .   Per test claim: PA required; PA submitted to above mentioned insurance via Fax Key/confirmation #/EOC [id number] Status is pending    FAX: [phone] PHONE: [phone]

## 2023-07-19 NOTE — Telephone Encounter (Signed)
 Pharmacy Patient Advocate Encounter  Received notification from [facility] that Prior Authorization for [id number]  has been APPROVED from 07/18/2023 to 04/15/2024    PLEASE BE ADVISED APPROVAL LETTER HAS BEEN SCANNED IN MEDIA OF CHART

## 2023-07-24 NOTE — Telephone Encounter (Signed)
 She can try melatonin; however, it's not always the best at helping keep people asleep. If she wants to take it, recommend starting at 1-5 mg At bedtime and don't take the lunesta those nights. Sometimes gives people vivid dreams.

## 2023-07-29 ENCOUNTER — Other Ambulatory Visit: Payer: Self-pay | Admitting: Internal Medicine

## 2023-07-29 DIAGNOSIS — N1831 Chronic kidney disease, stage 3a: Secondary | ICD-10-CM

## 2023-07-29 DIAGNOSIS — I10 Essential (primary) hypertension: Secondary | ICD-10-CM

## 2023-07-30 ENCOUNTER — Other Ambulatory Visit: Payer: Self-pay

## 2023-07-30 MED ORDER — DILTIAZEM HCL ER COATED BEADS 120 MG PO CP24: 120.0000 mg | ORAL_CAPSULE | Freq: Every day | ORAL | 1 refills | Status: DC

## 2023-07-30 NOTE — Telephone Encounter (Signed)
This is a A-Fib clinic pt 

## 2023-08-01 ENCOUNTER — Telehealth (HOSPITAL_COMMUNITY): Payer: Self-pay

## 2023-08-01 ENCOUNTER — Ambulatory Visit (HOSPITAL_COMMUNITY)
Admission: RE | Admit: 2023-08-01 | Discharge: 2023-08-01 | Disposition: A | Discharge status: Home / Self Care | Source: Ambulatory Visit | Attending: Physician Assistant | Admitting: Physician Assistant

## 2023-08-01 VITALS — BP 130/84 | HR 70 | Ht 61.0 in | Wt 279.3 lb

## 2023-08-01 DIAGNOSIS — Z6841 Body Mass Index (BMI) 40.0 and over, adult: Secondary | ICD-10-CM | POA: Diagnosis not present

## 2023-08-01 DIAGNOSIS — I10 Essential (primary) hypertension: Secondary | ICD-10-CM | POA: Insufficient documentation

## 2023-08-01 DIAGNOSIS — Z87442 Personal history of urinary calculi: Secondary | ICD-10-CM | POA: Diagnosis not present

## 2023-08-01 DIAGNOSIS — G4733 Obstructive sleep apnea (adult) (pediatric): Secondary | ICD-10-CM | POA: Diagnosis not present

## 2023-08-01 DIAGNOSIS — Z5181 Encounter for therapeutic drug level monitoring: Secondary | ICD-10-CM

## 2023-08-01 DIAGNOSIS — I480 Paroxysmal atrial fibrillation: Secondary | ICD-10-CM

## 2023-08-01 DIAGNOSIS — I4891 Unspecified atrial fibrillation: Secondary | ICD-10-CM | POA: Diagnosis present

## 2023-08-01 DIAGNOSIS — E669 Obesity, unspecified: Secondary | ICD-10-CM | POA: Insufficient documentation

## 2023-08-01 DIAGNOSIS — Z79899 Other long term (current) drug therapy: Secondary | ICD-10-CM | POA: Diagnosis not present

## 2023-08-01 DIAGNOSIS — Z7901 Long term (current) use of anticoagulants: Secondary | ICD-10-CM | POA: Insufficient documentation

## 2023-08-01 DIAGNOSIS — D6869 Other thrombophilia: Secondary | ICD-10-CM | POA: Diagnosis not present

## 2023-08-01 NOTE — Progress Notes (Signed)
 Primary Care Physician: Raliegh Ip, DO Primary Cardiologist: Chrystie Nose, MD Electrophysiologist: Will Jorja Loa, MD  Referring Physician: Dr Royann Shivers    Rebekah [name] is a 68 y.o. female with a history of CKD, HTN, OSA, atrial fibrillation who presents for follow up in the [facility]. Patient is on Eliquis for stroke prevention. She has been maintained on flecainide for rhythm control.  Patient returns for follow up for atrial fibrillation and flecainide monitoring. She reports that she was in afib for about 2 weeks in the setting of an acute GI illness. Otherwise, she has been maintaining SR the majority of the time. No bleeding issues on anticoagulation.   Today, she  denies symptoms of palpitations, chest pain, shortness of breath, orthopnea, PND, lower extremity edema, dizziness, presyncope, syncope, bleeding, or neurologic sequela. The patient is tolerating medications without difficulties and is otherwise without complaint today.    Atrial Fibrillation Risk Factors:  she does have symptoms or diagnosis of sleep apnea. she does not have a history of rheumatic fever.   Atrial Fibrillation Management history:  Previous antiarrhythmic drugs: flecainide  Previous cardioversions: none Previous ablations: none Anticoagulation history: Eliquis  ROS- All systems are reviewed and negative except as per the HPI above.  Past Medical History:  Diagnosis Date   Allergy    Multiple   Anxiety    Arthritis    Atrial fibrillation with rapid ventricular response (HCC) 12/26/2021   Atrial fibrillation with RVR (HCC) 12/27/2021   Bilateral bunions    Cataract    Both eyes- lenses replaced   Chronic kidney disease 2022   Not kidney disease but kidney stone   Complication of anesthesia    Depression    More anxiety-worry-stress   Difficulty sleeping    prior sleep study did not reveal sleep apnea per patient   DJD (degenerative joint  disease)    Dyspnea    Dysrhythmia    a-fib   Environmental allergies    Food allergy    Gallbladder problem    GERD (gastroesophageal reflux disease)    Heart murmur    History of kidney stones    Hypertension    no meds after weight loss   Hypothyroidism    Incontinence of urine    at nite    Joint pain    Left atrial dilatation    Left foot pain    Leg edema    Obesity    s/p gastric sleeve 12/2013 (previously weighed close to 400 lbs)   Obstructive sleep apnea 12/07/2022   Osteoarthritis    Oxygen deficiency    3L/min for sleep due to slowed reaperations during sleep   Palpitations    Paroxysmal atrial fibrillation (HCC)    Pneumonia    hx of    PONV (postoperative nausea and vomiting)    Status post bilateral knee replacements    Supplemental oxygen dependent    uses 2l/[place] at night, states HR goes low and O2 drops   Tuberculosis    had 6 month of INH due to exposure    Vaginal vault prolapse     Current Outpatient Medications  Medication Sig Dispense Refill   acetaminophen (TYLENOL 8 HOUR) 650 MG CR tablet Take 1,300 mg by mouth 2 (two) times daily.     busPIRone (BUSPAR) 10 MG tablet Take 2 tablets (20 mg total) by mouth 2 (two) times daily. 360 tablet 0   cetirizine (ZYRTEC ALLERGY) 10 MG  tablet Take 10 mg by mouth daily.     cholecalciferol (VITAMIN D3) 25 MCG (1000 UNIT) tablet Take 1,000 Units by mouth daily.     diltiazem (CARDIZEM CD) 120 MG 24 hr capsule Take 1 capsule (120 mg total) by mouth at bedtime. 90 capsule 1   diltiazem (CARDIZEM CD) 240 MG 24 hr capsule TAKE ONE CAPSULE BY MOUTH EVERY DAY 90 capsule 3   diltiazem (CARDIZEM) 30 MG tablet TAKE ONE TO TWO TABLETS BY MOUTH EVERY 6 HOURS AS NEEDED FOR FAST HEART RATES 30 tablet 9   ELIQUIS 5 MG TABS tablet TAKE ONE TABLET BY MOUTH TWICE DAILY 180 tablet 1   EPINEPHrine 0.3 mg/0.3 mL IJ SOAJ injection Inject 0.3 mg into the muscle as needed for anaphylaxis (then call 911/ go to ER). 2 each 3   ferrous  sulfate 325 (65 FE) MG tablet Take 325 mg by mouth daily with breakfast.     flecainide (TAMBOCOR) 50 MG tablet Take 1 tablet (50 mg total) by mouth 2 (two) times daily. 180 tablet 2   fluticasone (FLONASE) 50 MCG/ACT nasal spray Place 2 sprays into both nostrils daily. (Patient taking differently: Place 2 sprays into both nostrils as needed.) 16 g 6   furosemide (LASIX) 20 MG tablet TAKE 1 TABLET BY MOUTH ONCE DAILY (Patient taking differently: Take 20 mg by mouth daily as needed. TAKE 1 TABLET BY MOUTH ONCE DAILY) 90 tablet 3   hydroxychloroquine (PLAQUENIL) 200 MG tablet Take 200 mg by mouth 2 (two) times daily.     levothyroxine (SYNTHROID) 50 MCG tablet TAKE ONE TABLET BY MOUTH DAILY BEFORE BREAKFAST 90 tablet 0   melatonin 5 MG TABS Taking 2.5 mg- 5 mg as needed for sleep     rosuvastatin (CRESTOR) 10 MG tablet Take 1 tablet (10 mg total) by mouth daily. 90 tablet 3   sertraline (ZOLOFT) 50 MG tablet Take 1 tablet (50 mg total) by mouth daily. 90 tablet 3   telmisartan (MICARDIS) 80 MG tablet Take 1 tablet (80 mg total) by mouth daily. 90 tablet 3   tirzepatide (ZEPBOUND) 2.5 MG/0.5ML Pen Inject 2.5 mg into the skin once a week. 2 mL 0   triamcinolone cream (KENALOG) 0.1 % Apply 1 Application topically 2 (two) times daily as needed (itchy rash on back/ neck). 80 g 0   tirzepatide (ZEPBOUND) 10 MG/0.5ML Pen Inject 10 mg into the skin once a week. (Patient not taking: Reported on 08/01/2023) 2 mL 0   tirzepatide (ZEPBOUND) 5 MG/0.5ML Pen Inject 5 mg into the skin once a week. (Patient not taking: Reported on 08/01/2023) 2 mL 0   tirzepatide (ZEPBOUND) 7.5 MG/0.5ML Pen Inject 7.5 mg into the skin once a week. (Patient not taking: Reported on 08/01/2023) 2 mL 0   No current facility-administered medications for this encounter.    Physical Exam: BP 130/84   Pulse 70   Ht 5\' 1"  (1.549 m)   Wt 126.7 kg   BMI 52.77 kg/m   GEN: Well nourished, well developed in no acute distress CARDIAC:  Regular rate and rhythm, no murmurs, rubs, gallops RESPIRATORY:  Clear to auscultation without rales, wheezing or rhonchi  ABDOMEN: Soft, non-tender, non-distended EXTREMITIES:  No edema; No deformity    Wt Readings from Last 3 Encounters:  08/01/23 126.7 kg  07/15/23 130.6 kg  04/04/23 128.8 kg     EKG today demonstrates  SR, 1st degree AV block RBBB Vent. rate 70 BPM PR interval 212 ms QRS duration  170 ms QT/QTcB 438/473 ms   Echo 10/08/22 demonstrated   1. Left ventricular ejection fraction, by estimation, is 65 to 70%. The  left ventricle has normal function. The left ventricle has no regional  wall motion abnormalities. There is mild left ventricular hypertrophy.  Left ventricular diastolic parameters were normal.   2. Right ventricular systolic function is normal. The right ventricular  size is normal. There is normal pulmonary artery systolic pressure. The  estimated right ventricular systolic pressure is 32.6 mmHg.   3. The mitral valve is normal in structure. Trivial mitral valve  regurgitation. No evidence of mitral stenosis.   4. The aortic valve was not well visualized. Aortic valve regurgitation  is trivial. No aortic stenosis is present.   5. Aortic dilatation noted. There is dilatation of the ascending aorta,  measuring 42 mm.   6. The inferior vena cava is normal in size with greater than 50%  respiratory variability, suggesting right atrial pressure of 3 mmHg.    CHA2DS2-VASc Score = 3  The patient's score is based upon: CHF History: 0 HTN History: 1 Diabetes History: 0 Stroke History: 0 Vascular Disease History: 0 Age Score: 1 Gender Score: 1       ASSESSMENT AND PLAN: Paroxysmal Atrial Fibrillation (ICD10:  I48.0) The patient's CHA2DS2-VASc score is 3, indicating a 3.2% annual risk of stroke.   Patient appears to be maintaining SR majority of the time.  Her QRS has widened further to 170 ms, usually 140s-150s. We discussed changing AAD.   Patient would like to pursue dofetilide admission Continue Eliquis 5 mg BID, states no missed doses in the last 3 weeks. No recent benadryl use PharmD to screen medications for QT prolonging agents. She will stop flecainide 3 days prior to admission. She is OK discontinuing hydroxychloroquine if needed.  QTc in SR 473 ms in setting of RBBB Continue diltiazem 240 mg AM and 120 mg PM with 30 mg PRN for heart racing.  Secondary Hypercoagulable State (ICD10:  D68.69) The patient is at significant risk for stroke/thromboembolism based upon her CHA2DS2-VASc Score of 3.  Continue Apixaban (Eliquis). No bleeding issues.   High Risk Medication Monitoring (ICD 10: Z79.899) QRS interval prolonged on flecainide. See plan above.   HTN Stable on current regimen  Obesity Body mass index is 52.77 kg/m.  Encouraged lifestyle modification Patient now on Zepbound.   OSA  Encouraged nightly CPAP Followed by Dr Baldwin Levee   Follow up in the AF clinic for dofetilide loading.       Myrtha Ates PA-C Afib Clinic [facility] [address] [place], [place] [address] [phone]

## 2023-08-01 NOTE — Patient Instructions (Signed)
 Please stop Flecainide 3 days prior to 4/29.

## 2023-08-01 NOTE — Telephone Encounter (Signed)
 Initiated Tikosyn admission for date of service: 08/13/2023. Phone # [phone] Faxed clinical information for review. Pending authorization # [id number]  Fax #  [phone]

## 2023-08-02 ENCOUNTER — Encounter: Payer: Self-pay | Admitting: Internal Medicine

## 2023-08-02 NOTE — Telephone Encounter (Signed)
 Tikosyn admission has been approved for date of service 08/13/23. Discharge information will need to be faxed to [facility]. Fax # [phone] Phone # [phone] Ext [phone] Authorization # [id number]

## 2023-08-05 ENCOUNTER — Telehealth: Payer: Self-pay | Admitting: Pharmacist Clinician (PhC)/ Clinical Pharmacy Specialist

## 2023-08-05 NOTE — Telephone Encounter (Signed)
Medication list reviewed in anticipation of upcoming Tikosyn initiation. Patient is not taking any contraindicated or QTc prolonging medications.   Patient is anticoagulated on Eliquis on the appropriate dose. Please ensure that patient has not missed any anticoagulation doses in the 3 weeks prior to Tikosyn initiation.   Patient will need to be counseled to avoid use of Benadryl while on Tikosyn and in the 2-3 days prior to Tikosyn initiation.  

## 2023-08-05 NOTE — Telephone Encounter (Signed)
-----   Message from Nurse Stacy C sent at 08/05/2023 11:46 AM EDT ----- Regarding: tikosyn Please review her meds for tikosyn pt is concerned regarding ingredients and her allergies. See her mychart messages as well.  Thanks stacy

## 2023-08-05 NOTE — Telephone Encounter (Signed)
 Addendum:  missed the fact that patient is on hydroxychloroquine.    Is QT prolonging, with a half life of ~40 days.  The interaction is considered a C - Monitor therapy.   If you don't want to wait for 3-5 half lives,  would recommend closer monitoring of EKG.  Please review with EP.    Patient also has alpha-gal beef/pork allergy.  Capsules are made with gelatin (usually beef or pork based, however she currently takes diltiazem  capsules without problems).  Would think she is fine with this.

## 2023-08-06 ENCOUNTER — Telehealth: Payer: Self-pay | Admitting: Pharmacist

## 2023-08-06 NOTE — Telephone Encounter (Addendum)
 Medication list reviewed in anticipation of upcoming Tikosyn initiation. Patient is not taking any contraindicated medication. She is on a few QTc prolonging medications.   Hydroxychloroquine- indeterminate risk of QTc prolongation- recommendation is to monitor, however given she is on a few things that can increase QTc along with being female and on a diuretic, if therapy could be changed this would be more ideal.  Sertraline - indeterminate risk of QTc prolongation. Ideally this would be changed to a non- QTc prolonging antidepressant like an SNRI, however if patient is stable and change cannot be made, then close monitoring is needed.  Diltiazem  can increase the concentration of Tikosyn and monitoring should be done for adverse effects  Patient is anticoagulated on Eliquis  on the appropriate dose. Please ensure that patient has not missed any anticoagulation doses in the 3 weeks prior to Tikosyn initiation.   Patient will need to be counseled to avoid use of Benadryl  while on Tikosyn and in the 2-3 days prior to Tikosyn initiation.  Flecainide  should be stopped 3-5 days prior to admission.  Electrolytes should be monitored closely since she is on furosemide , K at least 4 and Mag at least 2.  Another concern is patient's allergy to pork/beef (anaphylaxis) The capsules are made of gelatin and magnesium  stearate is a filler. Both of which can be derived from animal products.

## 2023-08-07 NOTE — Telephone Encounter (Signed)
 Patient prefers to stop hydroxychloroquine which she stopped on 4/22.  Patient instructed to stop flecainide  3 days prior to admission. Pt will continue on current dose of sertraline  per [facility] PA.  Patient verbalized understanding regarding capsule made of gelatin and magnesium  stearate as a filler with allergy to pork/beef and wishes to proceed with admission.

## 2023-08-09 ENCOUNTER — Telehealth (HOSPITAL_COMMUNITY): Payer: Self-pay

## 2023-08-09 NOTE — Telephone Encounter (Signed)
 Called [employer] spoke with Alvah Auerbach she changed admission date to 09/10/23.

## 2023-08-13 ENCOUNTER — Ambulatory Visit (HOSPITAL_COMMUNITY): Admitting: Physician Assistant

## 2023-08-13 ENCOUNTER — Encounter: Payer: Self-pay | Admitting: Family Medicine

## 2023-08-14 DIAGNOSIS — G4733 Obstructive sleep apnea (adult) (pediatric): Secondary | ICD-10-CM | POA: Diagnosis not present

## 2023-08-20 DIAGNOSIS — H35372 Puckering of macula, left eye: Secondary | ICD-10-CM | POA: Diagnosis not present

## 2023-08-20 DIAGNOSIS — H47291 Other optic atrophy, right eye: Secondary | ICD-10-CM | POA: Diagnosis not present

## 2023-08-20 DIAGNOSIS — H18513 Endothelial corneal dystrophy, bilateral: Secondary | ICD-10-CM | POA: Diagnosis not present

## 2023-08-20 DIAGNOSIS — H43813 Vitreous degeneration, bilateral: Secondary | ICD-10-CM | POA: Diagnosis not present

## 2023-08-20 DIAGNOSIS — Z961 Presence of intraocular lens: Secondary | ICD-10-CM | POA: Diagnosis not present

## 2023-08-21 DIAGNOSIS — G4733 Obstructive sleep apnea (adult) (pediatric): Secondary | ICD-10-CM | POA: Diagnosis not present

## 2023-08-22 ENCOUNTER — Other Ambulatory Visit

## 2023-08-28 ENCOUNTER — Other Ambulatory Visit

## 2023-08-28 DIAGNOSIS — I77810 Thoracic aortic ectasia: Secondary | ICD-10-CM | POA: Diagnosis not present

## 2023-08-29 LAB — BASIC METABOLIC PANEL WITH GFR
BUN/Creatinine Ratio: 15 (ref 12–28)
BUN: 15 mg/dL (ref 8–27)
CO2: 24 mmol/L (ref 20–29)
Calcium: 9.7 mg/dL (ref 8.7–10.3)
Chloride: 101 mmol/L (ref 96–106)
Creatinine, Ser: 0.99 mg/dL (ref 0.57–1.00)
Glucose: 96 mg/dL (ref 70–99)
Potassium: 4.3 mmol/L (ref 3.5–5.2)
Sodium: 140 mmol/L (ref 134–144)
eGFR: 62 mL/min/{1.73_m2} (ref >59)

## 2023-08-30 ENCOUNTER — Ambulatory Visit: Payer: Self-pay | Admitting: Student

## 2023-09-03 ENCOUNTER — Encounter: Payer: Self-pay | Admitting: Student

## 2023-09-05 ENCOUNTER — Ambulatory Visit
Admission: RE | Admit: 2023-09-05 | Discharge: 2023-09-05 | Disposition: A | Discharge status: Home / Self Care | Source: Ambulatory Visit | Attending: Student | Admitting: Student

## 2023-09-05 DIAGNOSIS — I700 Atherosclerosis of aorta: Secondary | ICD-10-CM | POA: Diagnosis not present

## 2023-09-05 DIAGNOSIS — I77810 Thoracic aortic ectasia: Secondary | ICD-10-CM | POA: Diagnosis not present

## 2023-09-05 MED ORDER — IOPAMIDOL (ISOVUE-370) INJECTION 76%
75.0000 mL | Freq: Once | INTRAVENOUS | Status: AC | PRN
  Administered 2023-09-05: 75 mL via INTRAVENOUS

## 2023-09-06 ENCOUNTER — Encounter (HOSPITAL_COMMUNITY): Payer: Self-pay

## 2023-09-10 ENCOUNTER — Telehealth (HOSPITAL_COMMUNITY): Payer: Self-pay | Admitting: Pharmacy Technician

## 2023-09-10 ENCOUNTER — Other Ambulatory Visit: Payer: Self-pay

## 2023-09-10 ENCOUNTER — Ambulatory Visit (HOSPITAL_COMMUNITY): Payer: Self-pay | Admitting: Internal Medicine

## 2023-09-10 ENCOUNTER — Inpatient Hospital Stay (HOSPITAL_COMMUNITY)
Admission: AD | Admit: 2023-09-10 | Discharge: 2023-09-13 | DRG: 309 | Disposition: A | Discharge status: Home / Self Care | Attending: Cardiology | Admitting: Cardiology

## 2023-09-10 ENCOUNTER — Ambulatory Visit (HOSPITAL_COMMUNITY)
Admission: RE | Admit: 2023-09-10 | Discharge: 2023-09-10 | Disposition: A | Discharge status: Home / Self Care | Source: Ambulatory Visit | Attending: Internal Medicine | Admitting: Internal Medicine

## 2023-09-10 ENCOUNTER — Other Ambulatory Visit (HOSPITAL_COMMUNITY): Payer: Self-pay

## 2023-09-10 ENCOUNTER — Encounter (HOSPITAL_COMMUNITY): Payer: Self-pay | Admitting: Cardiology

## 2023-09-10 VITALS — BP 142/80 | HR 69 | Ht 61.0 in | Wt 284.4 lb

## 2023-09-10 DIAGNOSIS — E039 Hypothyroidism, unspecified: Secondary | ICD-10-CM | POA: Diagnosis not present

## 2023-09-10 DIAGNOSIS — M21611 Bunion of right foot: Secondary | ICD-10-CM | POA: Diagnosis present

## 2023-09-10 DIAGNOSIS — Z8701 Personal history of pneumonia (recurrent): Secondary | ICD-10-CM

## 2023-09-10 DIAGNOSIS — Z6841 Body Mass Index (BMI) 40.0 and over, adult: Secondary | ICD-10-CM

## 2023-09-10 DIAGNOSIS — Z7901 Long term (current) use of anticoagulants: Secondary | ICD-10-CM

## 2023-09-10 DIAGNOSIS — E66813 Obesity, class 3: Secondary | ICD-10-CM | POA: Diagnosis present

## 2023-09-10 DIAGNOSIS — M1990 Unspecified osteoarthritis, unspecified site: Secondary | ICD-10-CM | POA: Diagnosis present

## 2023-09-10 DIAGNOSIS — I4891 Unspecified atrial fibrillation: Secondary | ICD-10-CM

## 2023-09-10 DIAGNOSIS — I493 Ventricular premature depolarization: Secondary | ICD-10-CM | POA: Diagnosis present

## 2023-09-10 DIAGNOSIS — I4819 Other persistent atrial fibrillation: Secondary | ICD-10-CM | POA: Diagnosis not present

## 2023-09-10 DIAGNOSIS — Z96653 Presence of artificial knee joint, bilateral: Secondary | ICD-10-CM | POA: Diagnosis present

## 2023-09-10 DIAGNOSIS — I4510 Unspecified right bundle-branch block: Secondary | ICD-10-CM | POA: Diagnosis present

## 2023-09-10 DIAGNOSIS — Z79899 Other long term (current) drug therapy: Secondary | ICD-10-CM | POA: Diagnosis not present

## 2023-09-10 DIAGNOSIS — R9431 Abnormal electrocardiogram [ECG] [EKG]: Secondary | ICD-10-CM | POA: Diagnosis present

## 2023-09-10 DIAGNOSIS — R001 Bradycardia, unspecified: Secondary | ICD-10-CM | POA: Diagnosis present

## 2023-09-10 DIAGNOSIS — F419 Anxiety disorder, unspecified: Secondary | ICD-10-CM | POA: Diagnosis present

## 2023-09-10 DIAGNOSIS — G479 Sleep disorder, unspecified: Secondary | ICD-10-CM | POA: Diagnosis present

## 2023-09-10 DIAGNOSIS — M21612 Bunion of left foot: Secondary | ICD-10-CM | POA: Diagnosis present

## 2023-09-10 DIAGNOSIS — Z7985 Long-term (current) use of injectable non-insulin antidiabetic drugs: Secondary | ICD-10-CM | POA: Diagnosis not present

## 2023-09-10 DIAGNOSIS — I480 Paroxysmal atrial fibrillation: Principal | ICD-10-CM | POA: Diagnosis present

## 2023-09-10 DIAGNOSIS — Z9981 Dependence on supplemental oxygen: Secondary | ICD-10-CM

## 2023-09-10 DIAGNOSIS — Z79890 Hormone replacement therapy (postmenopausal): Secondary | ICD-10-CM | POA: Diagnosis not present

## 2023-09-10 DIAGNOSIS — Z9884 Bariatric surgery status: Secondary | ICD-10-CM | POA: Diagnosis not present

## 2023-09-10 DIAGNOSIS — G4733 Obstructive sleep apnea (adult) (pediatric): Secondary | ICD-10-CM | POA: Diagnosis not present

## 2023-09-10 DIAGNOSIS — D6869 Other thrombophilia: Secondary | ICD-10-CM | POA: Diagnosis present

## 2023-09-10 DIAGNOSIS — Z87442 Personal history of urinary calculi: Secondary | ICD-10-CM

## 2023-09-10 DIAGNOSIS — K219 Gastro-esophageal reflux disease without esophagitis: Secondary | ICD-10-CM | POA: Diagnosis present

## 2023-09-10 DIAGNOSIS — I10 Essential (primary) hypertension: Secondary | ICD-10-CM | POA: Diagnosis not present

## 2023-09-10 DIAGNOSIS — F32A Depression, unspecified: Secondary | ICD-10-CM | POA: Diagnosis present

## 2023-09-10 LAB — BASIC METABOLIC PANEL WITH GFR
BUN/Creatinine Ratio: 16 (ref 12–28)
BUN: 18 mg/dL (ref 8–27)
CO2: 28 mmol/L (ref 20–29)
Calcium: 10 mg/dL (ref 8.7–10.3)
Chloride: 102 mmol/L (ref 96–106)
Creatinine, Ser: 1.11 mg/dL — ABNORMAL HIGH (ref 0.57–1.00)
Glucose: 98 mg/dL (ref 70–99)
Potassium: 4.3 mmol/L (ref 3.5–5.2)
Sodium: 141 mmol/L (ref 134–144)
eGFR: 54 mL/min/{1.73_m2} — ABNORMAL LOW (ref >59)

## 2023-09-10 LAB — HIV ANTIBODY (ROUTINE TESTING W REFLEX): HIV Screen 4th Generation wRfx: NONREACTIVE

## 2023-09-10 LAB — MAGNESIUM: Magnesium: 2.2 mg/dL (ref 1.6–2.3)

## 2023-09-10 MED ORDER — BUSPIRONE HCL 5 MG PO TABS
20.0000 mg | ORAL_TABLET | Freq: Two times a day (BID) | ORAL | Status: DC
  Administered 2023-09-10 – 2023-09-13 (×6): 20 mg via ORAL
  Filled 2023-09-10 (×7): qty 4

## 2023-09-10 MED ORDER — SODIUM CHLORIDE 0.9% FLUSH: 3.0000 mL | INTRAVENOUS | Status: DC | PRN

## 2023-09-10 MED ORDER — DILTIAZEM HCL ER COATED BEADS 120 MG PO CP24
120.0000 mg | ORAL_CAPSULE | Freq: Every day | ORAL | Status: DC
  Filled 2023-09-10: qty 1

## 2023-09-10 MED ORDER — ACETAMINOPHEN ER 650 MG PO TBCR: 1300.0000 mg | EXTENDED_RELEASE_TABLET | Freq: Two times a day (BID) | ORAL | Status: DC

## 2023-09-10 MED ORDER — IRBESARTAN 150 MG PO TABS
300.0000 mg | ORAL_TABLET | Freq: Every day | ORAL | Status: DC
  Administered 2023-09-11 – 2023-09-13 (×3): 300 mg via ORAL
  Filled 2023-09-10 (×3): qty 2

## 2023-09-10 MED ORDER — ACETAMINOPHEN 500 MG PO TABS
1000.0000 mg | ORAL_TABLET | Freq: Two times a day (BID) | ORAL | Status: DC
  Administered 2023-09-10 – 2023-09-11 (×2): 1000 mg via ORAL
  Filled 2023-09-10 (×2): qty 2

## 2023-09-10 MED ORDER — SODIUM CHLORIDE 0.9% FLUSH
3.0000 mL | Freq: Two times a day (BID) | INTRAVENOUS | Status: DC
  Administered 2023-09-10 – 2023-09-13 (×6): 3 mL via INTRAVENOUS

## 2023-09-10 MED ORDER — DOFETILIDE 500 MCG PO CAPS
500.0000 ug | ORAL_CAPSULE | Freq: Two times a day (BID) | ORAL | Status: DC
  Administered 2023-09-10 – 2023-09-13 (×6): 500 ug via ORAL
  Filled 2023-09-10 (×6): qty 1

## 2023-09-10 MED ORDER — LEVOTHYROXINE SODIUM 50 MCG PO TABS
50.0000 ug | ORAL_TABLET | Freq: Every day | ORAL | Status: DC
  Administered 2023-09-11 – 2023-09-13 (×3): 50 ug via ORAL
  Filled 2023-09-10 (×3): qty 1

## 2023-09-10 MED ORDER — SODIUM CHLORIDE 0.9 % IV SOLN: 250.0000 mL | INTRAVENOUS | Status: AC | PRN

## 2023-09-10 MED ORDER — SERTRALINE HCL 50 MG PO TABS
50.0000 mg | ORAL_TABLET | Freq: Every day | ORAL | Status: DC
  Administered 2023-09-11 – 2023-09-13 (×3): 50 mg via ORAL
  Filled 2023-09-10 (×3): qty 1

## 2023-09-10 MED ORDER — ROSUVASTATIN CALCIUM 5 MG PO TABS
10.0000 mg | ORAL_TABLET | Freq: Every day | ORAL | Status: DC
  Administered 2023-09-11 – 2023-09-13 (×3): 10 mg via ORAL
  Filled 2023-09-10 (×3): qty 2

## 2023-09-10 MED ORDER — APIXABAN 5 MG PO TABS
5.0000 mg | ORAL_TABLET | Freq: Two times a day (BID) | ORAL | Status: DC
  Administered 2023-09-10 – 2023-09-13 (×6): 5 mg via ORAL
  Filled 2023-09-10 (×6): qty 1

## 2023-09-10 MED ORDER — DILTIAZEM HCL ER COATED BEADS 240 MG PO CP24
240.0000 mg | ORAL_CAPSULE | Freq: Every day | ORAL | Status: DC
  Administered 2023-09-11: 240 mg via ORAL
  Filled 2023-09-10 (×2): qty 1

## 2023-09-10 NOTE — Plan of Care (Signed)

## 2023-09-10 NOTE — Progress Notes (Signed)
 Primary Care Physician: Eliodoro Guerin, DO Primary Cardiologist: Hazle Lites, MD Electrophysiologist: Will Cortland Ding, MD  Referring Physician: Dr Alvis Ba    Rebekah [name] is a 68 y.o. female with a history of CKD, HTN, OSA, atrial fibrillation who presents for follow up in the [facility]. Patient is on Eliquis  for stroke prevention. She has been maintained on flecainide  for rhythm control.  Patient returns for follow up for atrial fibrillation and flecainide  monitoring. She reports that she was in afib for about 2 weeks in the setting of an acute GI illness. Otherwise, she has been maintaining SR the majority of the time. No bleeding issues on anticoagulation.   On follow up 09/10/23, patient is here for Tikosyn admission. She stopped flecainide  3 days prior to today's admission. No new medications since last OV. No benadryl  use. No missed doses of anticoagulant.   Today, she  denies symptoms of palpitations, chest pain, shortness of breath, orthopnea, PND, lower extremity edema, dizziness, presyncope, syncope, bleeding, or neurologic sequela. The patient is tolerating medications without difficulties and is otherwise without complaint today.    Atrial Fibrillation Risk Factors:  she does have symptoms or diagnosis of sleep apnea. she does not have a history of rheumatic fever.   Atrial Fibrillation Management history:  Previous antiarrhythmic drugs: flecainide   Previous cardioversions: none Previous ablations: none Anticoagulation history: Eliquis   ROS- All systems are reviewed and negative except as per the HPI above.  Past Medical History:  Diagnosis Date   Allergy    Multiple   Anxiety    Arthritis    Atrial fibrillation with rapid ventricular response (HCC) 12/26/2021   Atrial fibrillation with RVR (HCC) 12/27/2021   Bilateral bunions    Cataract    Both eyes- lenses replaced   Chronic kidney disease 2022   Not kidney  disease but kidney stone   Complication of anesthesia    Depression    More anxiety-worry-stress   Difficulty sleeping    prior sleep study did not reveal sleep apnea per patient   DJD (degenerative joint disease)    Dyspnea    Dysrhythmia    a-fib   Environmental allergies    Food allergy    Gallbladder problem    GERD (gastroesophageal reflux disease)    Heart murmur    History of kidney stones    Hypertension    no meds after weight loss   Hypothyroidism    Incontinence of urine    at nite    Joint pain    Left atrial dilatation    Left foot pain    Leg edema    Obesity    s/p gastric sleeve 12/2013 (previously weighed close to 400 lbs)   Obstructive sleep apnea 12/07/2022   Osteoarthritis    Oxygen deficiency    3L/min for sleep due to slowed reaperations during sleep   Palpitations    Paroxysmal atrial fibrillation (HCC)    Pneumonia    hx of    PONV (postoperative nausea and vomiting)    Status post bilateral knee replacements    Supplemental oxygen dependent    uses 2l/[place] at night, states HR goes low and O2 drops   Tuberculosis    had 6 month of INH due to exposure    Vaginal vault prolapse     Current Outpatient Medications  Medication Sig Dispense Refill   acetaminophen  (TYLENOL  8 HOUR) 650 MG CR tablet Take 1,300 mg by mouth  2 (two) times daily.     busPIRone  (BUSPAR ) 10 MG tablet Take 2 tablets (20 mg total) by mouth 2 (two) times daily. (Patient taking differently: Taking 2 tablets by mouth twice daily) 360 tablet 0   cetirizine (ZYRTEC ALLERGY) 10 MG tablet Take 10 mg by mouth daily.     cholecalciferol (VITAMIN D3) 25 MCG (1000 UNIT) tablet Take 1,000 Units by mouth daily.     diltiazem  (CARDIZEM  CD) 120 MG 24 hr capsule Take 1 capsule (120 mg total) by mouth at bedtime. 90 capsule 1   diltiazem  (CARDIZEM  CD) 240 MG 24 hr capsule TAKE ONE CAPSULE BY MOUTH EVERY DAY 90 capsule 3   diltiazem  (CARDIZEM ) 30 MG tablet TAKE ONE TO TWO TABLETS BY MOUTH EVERY 6  HOURS AS NEEDED FOR FAST HEART RATES 30 tablet 9   ELIQUIS  5 MG TABS tablet TAKE ONE TABLET BY MOUTH TWICE DAILY 180 tablet 1   EPINEPHrine  0.3 mg/0.3 mL IJ SOAJ injection Inject 0.3 mg into the muscle as needed for anaphylaxis (then call 911/ go to ER). 2 each 3   ferrous sulfate  325 (65 FE) MG tablet Take 325 mg by mouth daily with breakfast.     fluticasone  (FLONASE ) 50 MCG/ACT nasal spray Place 2 sprays into both nostrils daily. (Patient taking differently: Place 2 sprays into both nostrils as needed.) 16 g 6   furosemide  (LASIX ) 20 MG tablet TAKE 1 TABLET BY MOUTH ONCE DAILY (Patient taking differently: Take 20 mg by mouth as needed. TAKE 1 TABLET BY MOUTH ONCE DAILY) 90 tablet 3   levothyroxine  (SYNTHROID ) 50 MCG tablet TAKE ONE TABLET BY MOUTH DAILY BEFORE BREAKFAST 90 tablet 0   rosuvastatin  (CRESTOR ) 10 MG tablet Take 1 tablet (10 mg total) by mouth daily. 90 tablet 3   sertraline  (ZOLOFT ) 50 MG tablet Take 1 tablet (50 mg total) by mouth daily. 90 tablet 3   telmisartan  (MICARDIS ) 80 MG tablet Take 1 tablet (80 mg total) by mouth daily. 90 tablet 3   triamcinolone  cream (KENALOG ) 0.1 % Apply 1 Application topically 2 (two) times daily as needed (itchy rash on back/ neck). 80 g 0   tirzepatide  (ZEPBOUND ) 10 MG/0.5ML Pen Inject 10 mg into the skin once a week. (Patient not taking: Reported on 09/10/2023) 2 mL 0   tirzepatide  (ZEPBOUND ) 7.5 MG/0.5ML Pen Inject 7.5 mg into the skin once a week. (Patient not taking: Reported on 09/10/2023) 2 mL 0   No current facility-administered medications for this encounter.    Physical Exam: BP (!) 142/80   Pulse 69   Ht 5\' 1"  (1.549 m)   Wt 129 kg   BMI 53.74 kg/m   GEN- The patient is well appearing, alert and oriented x 3 today.   Neck - no JVD or carotid bruit noted Lungs- Clear to ausculation bilaterally, normal work of breathing Heart- Regular rate and rhythm, no murmurs, rubs or gallops, PMI not laterally displaced Extremities- no  clubbing, cyanosis, or edema Skin - no rash or ecchymosis noted    Wt Readings from Last 3 Encounters:  09/10/23 129 kg  08/01/23 126.7 kg  07/15/23 130.6 kg     EKG today demonstrates  Vent. rate 69 BPM PR interval 178 ms QRS duration 148 ms QT/QTcB 440/471 ms P-R-T axes 52 -12 3 Sinus rhythm with PVCs Right bundle branch block Abnormal ECG    Echo 10/08/22 demonstrated   1. Left ventricular ejection fraction, by estimation, is 65 to 70%. The  left ventricle  has normal function. The left ventricle has no regional  wall motion abnormalities. There is mild left ventricular hypertrophy.  Left ventricular diastolic parameters were normal.   2. Right ventricular systolic function is normal. The right ventricular  size is normal. There is normal pulmonary artery systolic pressure. The  estimated right ventricular systolic pressure is 32.6 mmHg.   3. The mitral valve is normal in structure. Trivial mitral valve  regurgitation. No evidence of mitral stenosis.   4. The aortic valve was not well visualized. Aortic valve regurgitation  is trivial. No aortic stenosis is present.   5. Aortic dilatation noted. There is dilatation of the ascending aorta,  measuring 42 mm.   6. The inferior vena cava is normal in size with greater than 50%  respiratory variability, suggesting right atrial pressure of 3 mmHg.    CHA2DS2-VASc Score = 3  The patient's score is based upon: CHF History: 0 HTN History: 1 Diabetes History: 0 Stroke History: 0 Vascular Disease History: 0 Age Score: 1 Gender Score: 1       ASSESSMENT AND PLAN: Paroxysmal Atrial Fibrillation (ICD10:  I48.0) The patient's CHA2DS2-VASc score is 3, indicating a 3.2% annual risk of stroke.   Flecainide  discontinued due to QRS widening.  Patient presents for dofetilide admission. She stopped flecainide  3 days prior to admission. Continue Eliquis  5 mg BID, states no missed doses in the last 3 weeks. No recent benadryl   use PharmD has screened medications QTc in SR 419 ms Labs today currently pending CrCl 107 mL/min from [facility] on 5/14.  Secondary Hypercoagulable State (ICD10:  D68.69) The patient is at significant risk for stroke/thromboembolism based upon her CHA2DS2-VASc Score of 3.  Continue Apixaban  (Eliquis ). No bleeding issues.    HTN Stable on current regimen  Obesity Body mass index is 53.74 kg/m.  Encouraged lifestyle modification Patient now on Zepbound ; has held for admission.  OSA  Encouraged nightly CPAP Followed by Dr Baldwin Levee   Patient will present to admissions when a bed is available. Bed appears to be 6E22.       Woody Heading, PA-C [facility] [address] [place], [place] [address] [phone]

## 2023-09-10 NOTE — Progress Notes (Signed)
 Pharmacy: Dofetilide (Tikosyn) - Initial Consult Assessment and Electrolyte Replacement  Pharmacy consulted to assist in monitoring and replacing electrolytes in this 68 y.o. female admitted on 09/10/2023 undergoing dofetilide initiation. First dofetilide dose: 09/10/23  Assessment:  Patient Exclusion Criteria: If any screening criteria checked as "Yes", then  patient  should NOT receive dofetilide until criteria item is corrected.  If "Yes" please indicate correction plan.  YES  NO Patient  Exclusion Criteria Correction Plan/Comments   []   [x]   Baseline QTc interval is greater than or equal to 440 msec. IF above YES box checked dofetilide contraindicated unless patient has ICD; then may proceed if QTc 500-550 msec or with known ventricular conduction abnormalities may proceed with QTc 550-600 msec. QTc = 419    []   [x]   Patient is known or suspected to have a digoxin level greater than 2 ng/ml: No results found for: "DIGOXIN"     []   [x]   Creatinine clearance less than 20 ml/min (calculated using Cockcroft-Gault, actual body weight and serum creatinine): Estimated Creatinine Clearance: 60.6 mL/min (A) (by C-G formula based on SCr of 1.11 mg/dL (H)).     []   [x]  Patient has received drugs known to prolong the QT intervals within the last 48 hour (examples: phenothiazines, tricyclics or tetracyclic antidepressants, macrolides, 1st generation H-1 antihistamines (especially diphenhydramine ), fluoroquinolones, azoles, ondansetron , metoclopramide , promethazine ).   Updated information on QT prolonging agents is available to be searched on the following database:QT prolonging agents -If SSRI or antihistamine needed, preferred options are sertraline  and loratadine  respectively     []   [x]  Patient received a dose of a thiazide diuretic in the last 48 hours [including hydrochlorothiazide (Oretic) alone or in any combination including triamterene (Dyazide, Maxzide)].    []   [x]  Patient  received a medication known to increase dofetilide plasma concentrations prior to initial dofetilide dose:  Trimethoprim  (Primsol, Proloprim ) in the last 36 hours Verapamil (Calan, Verelan) in the last 36 hours or a sustained release dose in the last 72 hours Megestrol (Megace) in the last 5 days  Cimetidine (Tagamet) in the last 6 hours Ketoconazole (Nizoral) in the last 24 hours Itraconazole (Sporanox) in the last 48 hours  Prochlorperazine (Compazine) in the last 36 hours     []   [x]   Patient is known to have a history of torsades de pointes; congenital or acquired long QT syndromes.    []   [x]   Patient has received a Class 1 and Class 3 antiarrhythmic with less than 2 half-lives since last dose. (Disopyramide, Quinidine, Procainamide, Lidocaine , Mexiletine, Flecainide , Propafenone, Sotalol, Dronedarone) Flecainide  stopped   []   [x]   Patient has received amiodarone therapy in the past 3 months or amiodarone level is greater than 0.3 ng/ml.    Labs:    Component Value Date/Time   K 4.3 09/10/2023 0904   MG 2.2 09/10/2023 9562     Plan: [facility] Calculated CrCl  Dose q12h  [x]  > 60 ml/min 500 mcg  []  40-60 ml/min 250 mcg  []  20-40 ml/min 125 mcg   [x]   Physician selected initial dose within range recommended for patients level of renal function - will monitor for response.  []   Physician selected initial dose outside of range recommended for patients level of renal function - will discuss if the dose should be altered at this time.   Patient has been appropriately anticoagulated with apixaban  5mg  BID.  Potassium: K >/= 4: Appropriate to initiate Tikosyn, no replacement needed    Magnesium : Mg >2:  Appropriate to initiate Tikosyn, no replacement needed     Thank you for allowing pharmacy to participate in this patient's care    Levin Reamer, PharmD, BCPS, [facility] Clinical Pharmacist [phone] Please check AMION for all [facility] numbers 09/10/2023

## 2023-09-10 NOTE — Telephone Encounter (Signed)
 Pharmacy Patient Advocate Encounter  Insurance verification completed.    The patient is insured through [employer]. Patient has Medicare and is not eligible for a copay card, but may be able to apply for patient assistance or Medicare RX Payment Plan (Patient Must reach out to their plan, if eligible for payment plan), if available.    Ran test claim for Dofetilide 500 mcg capsules and the current 30 day co-pay is $0.00.   This test claim was processed through [facility]- copay amounts may vary at other pharmacies due to pharmacy/plan contracts, or as the patient moves through the different stages of their insurance plan.

## 2023-09-10 NOTE — H&P (Addendum)
 Primary Care Physician: Eliodoro Guerin, DO Primary Cardiologist: Hazle Lites, MD Electrophysiologist: Will Cortland Ding, MD  Referring Physician: Dr Alvis Ba    Rebekah [name] is a 68 y.o. female with a history of CKD, HTN, OSA, atrial fibrillation who presents for follow up in the [facility]. Patient is on Eliquis  for stroke prevention. She has been maintained on flecainide  for rhythm control.  Patient returns for follow up for atrial fibrillation and flecainide  monitoring. She reports that she was in afib for about 2 weeks in the setting of an acute GI illness. Otherwise, she has been maintaining SR the majority of the time. No bleeding issues on anticoagulation.   On follow up 09/10/23, patient is here for Tikosyn admission. She stopped flecainide  3 days prior to today's admission. No new medications since last OV. No benadryl  use. No missed doses of anticoagulant.   Today, she  denies symptoms of palpitations, chest pain, shortness of breath, orthopnea, PND, lower extremity edema, dizziness, presyncope, syncope, bleeding, or neurologic sequela. The patient is tolerating medications without difficulties and is otherwise without complaint today.    Atrial Fibrillation Risk Factors:  she does have symptoms or diagnosis of sleep apnea. she does not have a history of rheumatic fever.   Atrial Fibrillation Management history:  Previous antiarrhythmic drugs: flecainide   Previous cardioversions: none Previous ablations: none Anticoagulation history: Eliquis   ROS- All systems are reviewed and negative except as per the HPI above.  Past Medical History:  Diagnosis Date   Allergy    Multiple   Anxiety    Arthritis    Atrial fibrillation with rapid ventricular response (HCC) 12/26/2021   Atrial fibrillation with RVR (HCC) 12/27/2021   Bilateral bunions    Cataract    Both eyes- lenses replaced   Chronic kidney disease 2022   Not kidney  disease but kidney stone   Complication of anesthesia    Depression    More anxiety-worry-stress   Difficulty sleeping    prior sleep study did not reveal sleep apnea per patient   DJD (degenerative joint disease)    Dyspnea    Dysrhythmia    a-fib   Environmental allergies    Food allergy    Gallbladder problem    GERD (gastroesophageal reflux disease)    Heart murmur    History of kidney stones    Hypertension    no meds after weight loss   Hypothyroidism    Incontinence of urine    at nite    Joint pain    Left atrial dilatation    Left foot pain    Leg edema    Obesity    s/p gastric sleeve 12/2013 (previously weighed close to 400 lbs)   Obstructive sleep apnea 12/07/2022   Osteoarthritis    Oxygen deficiency    3L/min for sleep due to slowed reaperations during sleep   Palpitations    Paroxysmal atrial fibrillation (HCC)    Pneumonia    hx of    PONV (postoperative nausea and vomiting)    Status post bilateral knee replacements    Supplemental oxygen dependent    uses 2l/[place] at night, states HR goes low and O2 drops   Tuberculosis    had 6 month of INH due to exposure    Vaginal vault prolapse     No current facility-administered medications for this encounter.   Current Outpatient Medications  Medication Sig Dispense Refill   acetaminophen  (TYLENOL  8 HOUR)  650 MG CR tablet Take 1,300 mg by mouth 2 (two) times daily.     busPIRone  (BUSPAR ) 10 MG tablet Take 2 tablets (20 mg total) by mouth 2 (two) times daily. (Patient taking differently: Taking 2 tablets by mouth twice daily) 360 tablet 0   cetirizine (ZYRTEC ALLERGY) 10 MG tablet Take 10 mg by mouth daily.     cholecalciferol (VITAMIN D3) 25 MCG (1000 UNIT) tablet Take 1,000 Units by mouth daily.     diltiazem  (CARDIZEM  CD) 120 MG 24 hr capsule Take 1 capsule (120 mg total) by mouth at bedtime. 90 capsule 1   diltiazem  (CARDIZEM  CD) 240 MG 24 hr capsule TAKE ONE CAPSULE BY MOUTH EVERY DAY 90 capsule 3    diltiazem  (CARDIZEM ) 30 MG tablet TAKE ONE TO TWO TABLETS BY MOUTH EVERY 6 HOURS AS NEEDED FOR FAST HEART RATES 30 tablet 9   ELIQUIS  5 MG TABS tablet TAKE ONE TABLET BY MOUTH TWICE DAILY 180 tablet 1   EPINEPHrine  0.3 mg/0.3 mL IJ SOAJ injection Inject 0.3 mg into the muscle as needed for anaphylaxis (then call 911/ go to ER). 2 each 3   ferrous sulfate  325 (65 FE) MG tablet Take 325 mg by mouth daily with breakfast.     fluticasone  (FLONASE ) 50 MCG/ACT nasal spray Place 2 sprays into both nostrils daily. (Patient taking differently: Place 2 sprays into both nostrils as needed.) 16 g 6   furosemide  (LASIX ) 20 MG tablet TAKE 1 TABLET BY MOUTH ONCE DAILY (Patient taking differently: Take 20 mg by mouth as needed. TAKE 1 TABLET BY MOUTH ONCE DAILY) 90 tablet 3   levothyroxine  (SYNTHROID ) 50 MCG tablet TAKE ONE TABLET BY MOUTH DAILY BEFORE BREAKFAST 90 tablet 0   rosuvastatin  (CRESTOR ) 10 MG tablet Take 1 tablet (10 mg total) by mouth daily. 90 tablet 3   sertraline  (ZOLOFT ) 50 MG tablet Take 1 tablet (50 mg total) by mouth daily. 90 tablet 3   telmisartan  (MICARDIS ) 80 MG tablet Take 1 tablet (80 mg total) by mouth daily. 90 tablet 3   tirzepatide  (ZEPBOUND ) 10 MG/0.5ML Pen Inject 10 mg into the skin once a week. (Patient not taking: Reported on 09/10/2023) 2 mL 0   tirzepatide  (ZEPBOUND ) 7.5 MG/0.5ML Pen Inject 7.5 mg into the skin once a week. (Patient not taking: Reported on 09/10/2023) 2 mL 0   triamcinolone  cream (KENALOG ) 0.1 % Apply 1 Application topically 2 (two) times daily as needed (itchy rash on back/ neck). 80 g 0    Physical Exam: There were no vitals taken for this visit.  GEN- The patient is well appearing, alert and oriented x 3 today.   Neck - no JVD or carotid bruit noted Lungs- Clear to ausculation bilaterally, normal work of breathing Heart- Regular rate and rhythm, no murmurs, rubs or gallops, PMI not laterally displaced Extremities- no clubbing, cyanosis, or edema Skin - no  rash or ecchymosis noted    Wt Readings from Last 3 Encounters:  09/10/23 129 kg  08/01/23 126.7 kg  07/15/23 130.6 kg     EKG today demonstrates  Vent. rate 69 BPM PR interval 178 ms QRS duration 148 ms QT/QTcB 440/471 ms P-R-T axes 52 -12 3 Sinus rhythm with PVCs Right bundle branch block Abnormal ECG    Echo 10/08/22 demonstrated   1. Left ventricular ejection fraction, by estimation, is 65 to 70%. The  left ventricle has normal function. The left ventricle has no regional  wall motion abnormalities. There is mild left  ventricular hypertrophy.  Left ventricular diastolic parameters were normal.   2. Right ventricular systolic function is normal. The right ventricular  size is normal. There is normal pulmonary artery systolic pressure. The  estimated right ventricular systolic pressure is 32.6 mmHg.   3. The mitral valve is normal in structure. Trivial mitral valve  regurgitation. No evidence of mitral stenosis.   4. The aortic valve was not well visualized. Aortic valve regurgitation  is trivial. No aortic stenosis is present.   5. Aortic dilatation noted. There is dilatation of the ascending aorta,  measuring 42 mm.   6. The inferior vena cava is normal in size with greater than 50%  respiratory variability, suggesting right atrial pressure of 3 mmHg.    CHA2DS2-VASc Score = 3  The patient's score is based upon: CHF History: 0 HTN History: 1 Diabetes History: 0 Stroke History: 0 Vascular Disease History: 0 Age Score: 1 Gender Score: 1       ASSESSMENT AND PLAN: Paroxysmal Atrial Fibrillation (ICD10:  I48.0) The patient's CHA2DS2-VASc score is 3, indicating a 3.2% annual risk of stroke.   Flecainide  discontinued due to QRS widening.  Patient presents for dofetilide admission. She stopped flecainide  3 days prior to admission. Continue Eliquis  5 mg BID, states no missed doses in the last 3 weeks. No recent benadryl  use PharmD has screened medications QTc  in SR 419 ms Labs today currently pending CrCl 107 mL/min from [facility] on 5/14.  Secondary Hypercoagulable State (ICD10:  D68.69) The patient is at significant risk for stroke/thromboembolism based upon her CHA2DS2-VASc Score of 3.  Continue Apixaban  (Eliquis ). No bleeding issues.    HTN Stable on current regimen  Obesity There is no height or weight on file to calculate BMI.  Encouraged lifestyle modification Patient now on Zepbound ; has held for admission.  OSA  Encouraged nightly CPAP Followed by Dr Baldwin Levee   Patient will present to admissions when a bed is available. Bed appears to be 6E22.       Woody Heading, PA-C Afib Clinic [facility] [address] [place], [place] [address] [phone]  ____________________________________________________  Admit for high risk drug monitoring: Tikosyn Loading. Previously on flecainide  for ~8-9 years but QRS widening noted & medication stopped by [facility] Clinic.   Primary EP > Dr. Lawana Pray Admit Labs > K+ 4.3, Mg+ 2.2, Cr 1.11 / CrCl 97 mL/min EKG Review > QTc 440 ms in NSR (V1) on 09/10/23  Anticipated Tikosyn Dose > 500 mcg  Anticoagulation > Eliquis  5mg  BID for CHA2DS2-VASc 3    Note patient has hx of Alpha-Gal with anaphylaxis.  Creighton Doffing, NP-C, AGACNP-BC [facility] - Electrophysiology  09/10/2023, 11:00 AM

## 2023-09-11 ENCOUNTER — Other Ambulatory Visit (HOSPITAL_COMMUNITY): Payer: Self-pay

## 2023-09-11 DIAGNOSIS — I480 Paroxysmal atrial fibrillation: Secondary | ICD-10-CM | POA: Diagnosis not present

## 2023-09-11 LAB — BASIC METABOLIC PANEL WITH GFR
Anion gap: 8 (ref 5–15)
Anion gap: 7 (ref 5–15)
BUN: 16 mg/dL (ref 8–23)
BUN: 16 mg/dL (ref 8–23)
CO2: 27 mmol/L (ref 22–32)
CO2: 27 mmol/L (ref 22–32)
Calcium: 9.2 mg/dL (ref 8.9–10.3)
Calcium: 9.9 mg/dL (ref 8.9–10.3)
Chloride: 104 mmol/L (ref 98–111)
Chloride: 106 mmol/L (ref 98–111)
Creatinine, Ser: 1.17 mg/dL — ABNORMAL HIGH (ref 0.44–1.00)
Creatinine, Ser: 1.09 mg/dL — ABNORMAL HIGH (ref 0.44–1.00)
GFR, Estimated: 51 mL/min — ABNORMAL LOW (ref >60)
GFR, Estimated: 55 mL/min — ABNORMAL LOW (ref >60)
Glucose, Bld: 103 mg/dL — ABNORMAL HIGH (ref 70–99)
Glucose, Bld: 121 mg/dL — ABNORMAL HIGH (ref 70–99)
Potassium: 3.5 mmol/L (ref 3.5–5.1)
Potassium: 4.5 mmol/L (ref 3.5–5.1)
Sodium: 139 mmol/L (ref 135–145)
Sodium: 140 mmol/L (ref 135–145)

## 2023-09-11 LAB — MAGNESIUM: Magnesium: 2.2 mg/dL (ref 1.7–2.4)

## 2023-09-11 MED ORDER — ACETAMINOPHEN 500 MG PO TABS
1000.0000 mg | ORAL_TABLET | Freq: Three times a day (TID) | ORAL | Status: DC
  Administered 2023-09-11 – 2023-09-13 (×6): 1000 mg via ORAL
  Filled 2023-09-11 (×6): qty 2

## 2023-09-11 MED ORDER — METHYLPREDNISOLONE SODIUM SUCC 125 MG IJ SOLR: 60.0000 mg | Freq: Once | INTRAMUSCULAR | Status: DC | PRN

## 2023-09-11 MED ORDER — FAMOTIDINE IN NACL 20-0.9 MG/50ML-% IV SOLN
20.0000 mg | Freq: Once | INTRAVENOUS | Status: DC | PRN
  Filled 2023-09-11: qty 50

## 2023-09-11 MED ORDER — POTASSIUM CHLORIDE CRYS ER 20 MEQ PO TBCR
30.0000 meq | EXTENDED_RELEASE_TABLET | Freq: Once | ORAL | Status: DC
  Filled 2023-09-11: qty 1

## 2023-09-11 MED ORDER — POTASSIUM CHLORIDE CRYS ER 20 MEQ PO TBCR
30.0000 meq | EXTENDED_RELEASE_TABLET | Freq: Once | ORAL | Status: AC
  Administered 2023-09-11: 30 meq via ORAL
  Filled 2023-09-11: qty 1

## 2023-09-11 NOTE — Progress Notes (Addendum)
 Pharmacy: Dofetilide (Tikosyn) - Follow Up Assessment and Electrolyte Replacement  Pharmacy consulted to assist in monitoring and replacing electrolytes in this 68 y.o. female admitted on 09/10/2023 undergoing dofetilide initiation. First dofetilide dose: 09/10/23  Labs:    Component Value Date/Time   K 3.5 09/11/2023 0339   MG 2.2 09/11/2023 4098     Plan: Potassium: K 3.5-3.7:  Give KCl 60 mEq po x1  (30 mEq ordered by MD, will order additional 30 mEq x1)  Magnesium : Mg > 2: No additional supplementation needed    Levin Reamer, PharmD, BCPS, [facility] Clinical Pharmacist [phone] Please check AMION for all [facility] Pharmacy numbers 09/11/2023

## 2023-09-11 NOTE — TOC CM/SW Note (Signed)
 Transition of Care [facility]) - Inpatient Brief Assessment   Patient Details  Name: Rebekah [name] MRN: [id number] Date of Birth: [birth year]  [facility] of [facility] CM/SW Contact:    Cosimo Diones, RN Phone Number: 09/11/2023, 4:10 PM   Clinical Narrative: Patient presented for Tikosyn Load. Case Manager spoke with the patient regarding co pay cost. Patient is agreeable to cost and would like to have the initial Rx filled via [facility] and the Rx refills 90 day supply escribed to [facility]. No further needs identified at this time.    Transition of Care Asessment: Insurance and Status: Insurance coverage has been reviewed Patient has primary care physician: Yes Home environment has been reviewed: reviewed Prior level of function:: independent Prior/Current Home Services: No current home services Social Drivers of Health Review: SDOH reviewed no interventions necessary Readmission risk has been reviewed: Yes Transition of care needs: no transition of care needs at this time

## 2023-09-11 NOTE — Progress Notes (Signed)
 EKG post first dose Tikosyn showed sinus brady with 1st degree AV block(previous EKG with right bundle branch block). Pt HR in low 50s, per pt her HR runs between 46 and 61 when asleep at home. Cardizem  120mg  24hr caps scheduled tonight. Discussed above with Annitta Kindler, MD, ok to hold Cardizem  tonight.

## 2023-09-11 NOTE — Progress Notes (Addendum)
 Morning EKG reviewed     Shows remains in NSR with stable QTc at 488 ms.   Continue  Tikosyn 500 mcg BID. No significant issues post am dose of Tikosyn. Continue to monitor closely.   Potassium4.5 (05/28 1610) Magnesium   2.2 (05/28 0339) Creatinine, ser  1.09* (05/28 9604)  Plan for home Friday if QTc remains stable     Creighton Doffing, NP-C, AGACNP-BC [facility] - Electrophysiology  09/11/2023, 11:06 AM

## 2023-09-11 NOTE — Progress Notes (Signed)
 Patient's heart rate is ranging 51-56. Currently it is 44 with a night dose of Cardizem  scheduled. Salah MD notified. Advised to hold night dose.

## 2023-09-11 NOTE — Progress Notes (Addendum)
 Electrophysiology Rounding Note  Patient Name: Rebekah [name] Date of Encounter: 09/11/2023  Primary Cardiologist: Hazle Lites, MD  Electrophysiologist: Lei Pump, MD    Subjective   Pt remains in NSR on Tikosyn  500 mcg BID   QTc from EKG last pm shows stable QTc at 452  She reports she had an "interesting evening last night".  She took her first dose of Tikosyn  and after 30 minutes had a hot flash that was similar to getting IV contrast but more intense.  No issues with rash, airway / breathing or swelling.  The sensation went away spontaneously.   Inpatient Medications    Scheduled Meds:  acetaminophen   1,000 mg Oral BID   apixaban   5 mg Oral BID   busPIRone   20 mg Oral BID   diltiazem   120 mg Oral QHS   diltiazem   240 mg Oral Daily   dofetilide   500 mcg Oral BID   irbesartan   300 mg Oral Daily   levothyroxine   50 mcg Oral Q0600   potassium chloride   30 mEq Oral Once   rosuvastatin   10 mg Oral Daily   sertraline   50 mg Oral Daily   sodium chloride  flush  3 mL Intravenous Q12H   Continuous Infusions:  sodium chloride      PRN Meds: sodium chloride , sodium chloride  flush   Vital Signs    Vitals:   09/10/23 1958 09/10/23 2300 09/11/23 0003 09/11/23 0500  BP: 131/76  (!) 153/73 (!) 148/83  Pulse: (!) 56 (!) 59 68 60  Resp: 19 18 20 19   Temp: 98.6 F (37 C)  98.4 F (36.9 C) 98 F (36.7 C)  TempSrc: Oral  Oral Oral  SpO2: 96% 96% 96% 96%  Weight:      Height:       No intake or output data in the 24 hours ending 09/11/23 0726 Filed Weights   09/10/23 1100  Weight: 129.5 kg    Physical Exam    GEN- NAD, A&O x 3. Normal affect.  Lungs- CTAB, Normal effort.  Heart- Regular rate and rhythm. No M/G/R GI- Soft, NT, ND Extremities- No clubbing, cyanosis, or edema Skin- no rash or lesion  Labs    CBC No results for input(s): "WBC", "NEUTROABS", "HGB", "HCT", "MCV", "PLT" in the last 72 hours.  Basic Metabolic Panel Recent Labs     09/10/23 0904 09/11/23 0339  NA 141 139  K 4.3 3.5  CL 102 104  CO2 28 27  GLUCOSE 98 103*  BUN 18 16  CREATININE 1.11* 1.17*  CALCIUM  10.0 9.2  MG 2.2 2.2    Telemetry    SB 50's - SR 80's, rare PVC's (personally reviewed)  Patient Profile     Rebekah [name] is a 68 y.o. female with a past medical history significant for persistent atrial fibrillation.  They were admitted for tikosyn  load.   Assessment & Plan    Persistent Atrial Fibrillation Pt remains in NSR on Tikosyn  500 mcg BID  Continue Eliquis  Creatinine, ser  1.17* (05/28 4098) Magnesium   2.2 (05/28 0339) Potassium3.5 (05/28 0339) Supplement K Douglas Rooks continue for a second dose of Tikosyn , if further issues like above, Armonte Tortorella stop Tikosyn .  Discussed other AAD options with her such as Multaq and Sotalol.   Plan for home Friday if QTc remains stable.   For questions or updates, please contact [facility] Please consult [email or web address] for contact info under Cardiology/STEMI.  Signed, Creighton Doffing, NP-C, AGACNP-BC [facility] -  Electrophysiology  09/11/2023, 7:29 AM  I have seen and examined this patient with Creighton Doffing.  Agree with above, note added to reflect my findings.  Feeling well today.  Episode of flushing immediately after her dose last night.  GEN: No acute distress.   Neck: No JVD Cardiac: RRR, no murmurs, rubs, or gallops.  Respiratory: normal BS bases bilaterally. GI: Soft, nontender, non-distended  MS: No edema; No deformity. Neuro:  Nonfocal  Skin: warm and dry Psych: Normal affect    Persistent atrial fibrillation: Remains in sinus rhythm.  Continue dofetilide 500 mcg twice daily.  Potassium supplemented. Secondary hypercoagulable: Continue Eliquis  Alpha gal: Capsule has gelatin.  Patient had flushing immediately after, but no breathing difficulties or rash.  Lamoine Magallon continue dofetilide for now.  If any issues arise, we Stirling Orton switch to sotalol or  Floria Brandau M. Demani Weyrauch  MD 09/11/2023 2:50 PM

## 2023-09-11 NOTE — TOC Benefit Eligibility Note (Signed)
 Pharmacy Patient Advocate Encounter  Insurance verification completed.    The patient is insured through [employer]. Patient has Medicare and is not eligible for a copay card, but may be able to apply for patient assistance or Medicare RX Payment Plan (Patient Must reach out to their plan, if eligible for payment plan), if available.    Ran test claim for dofetilide 500mg  and the current 30 day co-pay is $0.  Ran test claim for Tikosyn 500mg  and the current 30 day co-pay is requires a prior authorization.     This test claim was processed through [facility]- copay amounts may vary at other pharmacies due to pharmacy/plan contracts, or as the patient moves through the different stages of their insurance plan.

## 2023-09-11 NOTE — Progress Notes (Signed)
 Pt called at 2115 asking for RN. Upon entering pt room, pt c/o hot flash all over her body, and her eyes feeling tired.  No other symptoms at this time, she denied chest pain, no chest pressure,  no SOB, no skin rash nor redness, no edema nor itching sensation.  No changes in her mental status no generalized weakness. Pharmacy consulted, and nursing instructed to continue to monitor pt closely. About 15 min later pt reported improvement in her symptoms, with some heat still present to her thighs.  Pt continue to report decrease in her symptoms.  At 0010 pt reported that her symptoms are gone.  Will notify day shift team, and continue to monitor pt.

## 2023-09-12 ENCOUNTER — Encounter (HOSPITAL_COMMUNITY)
Admission: AD | Disposition: A | Discharge status: Home / Self Care | Payer: Self-pay | Source: Home / Self Care | Attending: Cardiology

## 2023-09-12 DIAGNOSIS — I480 Paroxysmal atrial fibrillation: Secondary | ICD-10-CM | POA: Diagnosis not present

## 2023-09-12 LAB — BASIC METABOLIC PANEL WITH GFR
Anion gap: 7 (ref 5–15)
BUN: 16 mg/dL (ref 8–23)
CO2: 26 mmol/L (ref 22–32)
Calcium: 9 mg/dL (ref 8.9–10.3)
Chloride: 107 mmol/L (ref 98–111)
Creatinine, Ser: 1.07 mg/dL — ABNORMAL HIGH (ref 0.44–1.00)
GFR, Estimated: 56 mL/min — ABNORMAL LOW (ref >60)
Glucose, Bld: 86 mg/dL (ref 70–99)
Potassium: 4.2 mmol/L (ref 3.5–5.1)
Sodium: 140 mmol/L (ref 135–145)

## 2023-09-12 LAB — MAGNESIUM: Magnesium: 2.3 mg/dL (ref 1.7–2.4)

## 2023-09-12 SURGERY — CARDIOVERSION (CATH LAB)
Anesthesia: General

## 2023-09-12 MED ORDER — DILTIAZEM HCL ER COATED BEADS 120 MG PO CP24
120.0000 mg | ORAL_CAPSULE | Freq: Every day | ORAL | Status: DC
  Administered 2023-09-12 – 2023-09-13 (×2): 120 mg via ORAL
  Filled 2023-09-12 (×2): qty 1

## 2023-09-12 NOTE — Progress Notes (Signed)
 Pharmacy: Dofetilide (Tikosyn) - Follow Up Assessment and Electrolyte Replacement  Pharmacy consulted to assist in monitoring and replacing electrolytes in this 68 y.o. female admitted on 09/10/2023 undergoing dofetilide initiation. First dofetilide dose: 09/10/23  Labs:    Component Value Date/Time   K 4.2 09/12/2023 0450   MG 2.3 09/12/2023 0450     Plan: Potassium: K >/= 4: No additional supplementation needed   Magnesium : Mg > 2: No additional supplementation needed    Levin Reamer, PharmD, BCPS, [facility] Clinical Pharmacist [phone] Please check AMION for all [facility] Pharmacy numbers 09/12/2023

## 2023-09-12 NOTE — Progress Notes (Signed)
 Called regarding HR's in 50's.  Stable bradycardia.    Reduced diltiazem  to 120 mg daily.    Creighton Doffing, NP-C, AGACNP-BC [facility] - Electrophysiology  09/12/2023, 11:13 AM

## 2023-09-12 NOTE — Plan of Care (Signed)
   Problem: Nutrition: Goal: Adequate nutrition will be maintained Outcome: Adequate for Discharge

## 2023-09-12 NOTE — Plan of Care (Signed)
   Problem: Education: Goal: Knowledge of General Education information will improve Description: Including pain rating scale, medication(s)/side effects and non-pharmacologic comfort measures Outcome: Progressing   Problem: Health Behavior/Discharge Planning: Goal: Ability to manage health-related needs will improve Outcome: Progressing   Problem: Clinical Measurements: Goal: Ability to maintain clinical measurements within normal limits will improve Outcome: Progressing Goal: Will remain free from infection Outcome: Progressing Goal: Diagnostic test results will improve Outcome: Progressing Goal: Respiratory complications will improve Outcome: Progressing Goal: Cardiovascular complication will be avoided Outcome: Progressing   Problem: Clinical Measurements: Goal: Ability to maintain clinical measurements within normal limits will improve Outcome: Progressing Goal: Will remain free from infection Outcome: Progressing Goal: Diagnostic test results will improve Outcome: Progressing Goal: Respiratory complications will improve Outcome: Progressing Goal: Cardiovascular complication will be avoided Outcome: Progressing

## 2023-09-12 NOTE — Progress Notes (Addendum)
 Electrophysiology Rounding Note  Patient Name: Rebekah [name] Date of Encounter: 09/12/2023  Primary Cardiologist: Hazle Lites, MD  Electrophysiologist: Lei Pump, MD    Subjective   Pt remains in NSR on Tikosyn 500 mcg BID   QTc from EKG last pm shows borderline QTc at 489 ms  The patient is doing well today. No acute issues overnight / no further flushing.  At this time, the patient denies chest pain, shortness of breath, or any new concerns.  Inpatient Medications    Scheduled Meds:  acetaminophen   1,000 mg Oral TID   apixaban   5 mg Oral BID   busPIRone   20 mg Oral BID   diltiazem   120 mg Oral QHS   diltiazem   240 mg Oral Daily   dofetilide  500 mcg Oral BID   irbesartan   300 mg Oral Daily   levothyroxine   50 mcg Oral Q0600   rosuvastatin   10 mg Oral Daily   sertraline   50 mg Oral Daily   sodium chloride  flush  3 mL Intravenous Q12H   Continuous Infusions:  famotidine (PEPCID) IV     PRN Meds: famotidine (PEPCID) IV, methylPREDNISolone  (SOLU-MEDROL ) injection, sodium chloride  flush   Vital Signs    Vitals:   09/11/23 1700 09/11/23 2131 09/12/23 0000 09/12/23 0441  BP: (!) 119/58 125/64 122/62 (!) 140/69  Pulse: (!) 54 (!) 54 73 (!) 59  Resp: (!) 29 18 18 20   Temp: 98.4 F (36.9 C) 98.4 F (36.9 C) 97.7 F (36.5 C) 97.7 F (36.5 C)  TempSrc: Oral Oral Oral Oral  SpO2: 98% 97% 98% 100%  Weight:      Height:        Intake/Output Summary (Last 24 hours) at 09/12/2023 0726 Last data filed at 09/12/2023 2694 Gross per 24 hour  Intake 720 ml  Output --  Net 720 ml   Filed Weights   09/10/23 1100  Weight: 129.5 kg    Physical Exam    GEN- pleasant adult female in NAD, A&O x 3. Normal affect.  Lungs- CTAB, Normal effort.  Heart- Regular rate and rhythm. No M/G/R GI- Soft, NT, ND Extremities- No clubbing, cyanosis, or edema Skin- no rashes or lesion  Labs    CBC No results for input(s): "WBC", "NEUTROABS", "HGB", "HCT", "MCV",  "PLT" in the last 72 hours.  Basic Metabolic Panel Recent Labs    85/46/27 0339 09/11/23 0927 09/12/23 0450  NA 139 140 140  K 3.5 4.5 4.2  CL 104 106 107  CO2 27 27 26   GLUCOSE 103* 121* 86  BUN 16 16 16   CREATININE 1.17* 1.09* 1.07*  CALCIUM  9.2 9.9 9.0  MG 2.2  --  2.3    Telemetry    SB mid 40's to SR 60's, 1AVB, occ PVC (personally reviewed)  Patient Profile     Rebekah [name] is a 68 y.o. female with a past medical history significant for paroxysmal atrial fibrillation.  They were admitted for tikosyn load.   Assessment & Plan    Paroxysmal Atrial Fibrillation Bradycardia  Pt remains in NSR on Tikosyn 500 mcg BID  Continue Eliquis  Creatinine, ser  1.07* (05/29 0450) Magnesium   2.3 (05/29 0450) Potassium4.2 (05/29 0450) No electrolyte supplementation needed Stop PM dose of Diltiazem  with bradycardia   Secondary Hypercoagulable State  Continue Eliquis  5mg  BID, appropriately dosed by age / wt   Alpha Gal  Tikosyn capsule has gelatin coating (can have meat protein), flushing after first dose, reduced  after second, none since -monitor closely, if further issues, would switch to sotalol   Plan for home Friday if QTc remains stable.   For questions or updates, please contact [facility] Please consult [email or web address] for contact info under Cardiology/STEMI.  Signed, Creighton Doffing, NP-C, AGACNP-BC [facility] - Electrophysiology  09/12/2023, 7:26 AM  I have seen and examined this patient with Creighton Doffing.  Agree with above, note added to reflect my findings.  Patient feeling well.  Did not have a reaction after her dofetilide  last night.  No acute complaints.  GEN: No acute distress.   Neck: No JVD Cardiac: Regular rhythm Respiratory: Normal work of breathing. GI: Soft, nontender, non-distended  MS: No edema; No deformity. Neuro:  Nonfocal  Skin: warm and dry Psych: Normal affect    Paroxysmal atrial fibrillation/high risk medication  monitoring: Remains in sinus rhythm.  QTc remains acceptable on dofetilide .  No need for supplementation of potassium magnesium .  Hazelynn Mckenny continue at current dose. Secondary hypercoagulable state: On Eliquis  Alpha gal: No further reactions to dofetilide .  Continue with current management.  Breaunna Gottlieb M. Jacaria Colburn MD 09/12/2023 11:22 AM

## 2023-09-12 NOTE — Progress Notes (Signed)
 Morning EKG reviewed     Shows remains in NSR with stable QTc at 455 ms.  Continue  Tikosyn 500 mcg BID.   Potassium4.2 (05/29 0450) Magnesium   2.3 (05/29 0450) Creatinine, ser  1.07* (05/29 0450)  Plan for home Friday if QTc remains stable    Creighton Doffing, NP-C, AGACNP-BC [facility] - Electrophysiology  09/12/2023, 4:18 PM

## 2023-09-13 ENCOUNTER — Other Ambulatory Visit (HOSPITAL_COMMUNITY): Payer: Self-pay

## 2023-09-13 DIAGNOSIS — I480 Paroxysmal atrial fibrillation: Secondary | ICD-10-CM | POA: Diagnosis not present

## 2023-09-13 LAB — BASIC METABOLIC PANEL WITH GFR
Anion gap: 4 — ABNORMAL LOW (ref 5–15)
BUN: 17 mg/dL (ref 8–23)
CO2: 28 mmol/L (ref 22–32)
Calcium: 9.3 mg/dL (ref 8.9–10.3)
Chloride: 108 mmol/L (ref 98–111)
Creatinine, Ser: 0.94 mg/dL (ref 0.44–1.00)
GFR, Estimated: >60 mL/min (ref >60)
Glucose, Bld: 90 mg/dL (ref 70–99)
Potassium: 4.6 mmol/L (ref 3.5–5.1)
Sodium: 140 mmol/L (ref 135–145)

## 2023-09-13 LAB — MAGNESIUM: Magnesium: 2.2 mg/dL (ref 1.7–2.4)

## 2023-09-13 MED ORDER — DOFETILIDE 500 MCG PO CAPS
500.0000 ug | ORAL_CAPSULE | Freq: Two times a day (BID) | ORAL | 6 refills | Status: DC
  Filled 2023-09-13: qty 60, 30d supply, fill #0

## 2023-09-13 MED ORDER — DILTIAZEM HCL ER COATED BEADS 120 MG PO CP24: 120.0000 mg | ORAL_CAPSULE | Freq: Every day | ORAL | Status: DC

## 2023-09-13 NOTE — Discharge Summary (Addendum)
 ELECTROPHYSIOLOGY DISCHARGE SUMMARY    Patient ID: REGIS WILAND,  MRN: [id number], DOB/AGE: 1955-08-26 68 y.o.  Admit date: 09/10/2023 Discharge date: 09/13/2023  Primary Care Physician: Eliodoro Guerin, DO  Primary Cardiologist: Hazle Lites, MD  Electrophysiologist: Dr. Lawana Pray   Primary Discharge Diagnosis:  1.  Paroxysmal atrial fibrillation status post Tikosyn loading this admission  Secondary Discharge Diagnosis:  Secondary Hypercoagulable State HTN OSA Alpha Gal CKD  Obesity  Hypothyroidism   Allergies  Allergen Reactions   Beef-Derived Drug Products Anaphylaxis    After tick bite, cannot eat beef, pork or lamb   Lambs Quarters Anaphylaxis    After tick bite cannot eat beef, pork or lamb   Mucinex [Guaifenesin Er] Anaphylaxis   Pork-Derived Products Anaphylaxis    After tick bite cannot eat beef, pork or lamb   Darvon [Propoxyphene] Other (See Comments)    Hallucinations    Ace Inhibitors Swelling and Cough    Pedal Edema   Hydrocodone Itching   Ppd [Tuberculin Purified Protein Derivative]     Always has positive testing to PPD, do not use   Hydromet [Hydrocodone Bit-Homatrop Mbr] Itching and Other (See Comments)    Severe stomach pain-face itches.    Sulfonamide Derivatives Rash     Procedures This Admission:  1.  Tikosyn loading   Brief HPI: DIOSELINA BRUMBAUGH is a 68 y.o. female with a past medical history as noted above.  They were referred to EP for treatment options of atrial fibrillation.  Risks, benefits, and alternatives to Tikosyn were reviewed with the patient who wished to proceed with admission for loading.  Hospital Course:  The patient was admitted and Tikosyn was initiated.  Renal function and electrolytes were followed during the hospitalization.  Their QTc remained stable. The patient presented in sinus rhythm and did not require cardioversion. The patients QTc remained stable. Her home cardizem  dosing was adjusted during  admission > reduced to 120mg  daily. They were monitored on telemetry up to discharge. On the day of discharge, they were examined by Dr. Lawana Pray  who considered them stable for discharge to home.  Follow-up has been arranged with the Atrial Fibrillation clinic in approximately 1 week.   Physical Exam: Vitals:   09/12/23 1655 09/12/23 2007 09/12/23 2306 09/13/23 0500  BP: 133/64 130/68 130/60 135/71  Pulse: (!) 56 (!) 54 (!) 49 72  Resp: 19 18  18   Temp: 98.2 F (36.8 C) 97.8 F (36.6 C) 97.6 F (36.4 C) 98.3 F (36.8 C)  TempSrc: Oral Oral Oral Oral  SpO2: 100% 96%  98%  Weight:      Height:        GEN- NAD, A&O x 3. Normal affect.  Lungs- CTAB, Normal effort.  Heart- Regular rate and rhythm. No M/G/R GI- Soft, NT, ND Extremities- No clubbing, cyanosis, or edema Skin- no rash or lesion  Labs:   Lab Results  Component Value Date   WBC 5.1 01/15/2023   HGB 12.2 01/15/2023   HCT 39.5 01/15/2023   MCV 87 01/15/2023   PLT 202 01/15/2023    Recent Labs  Lab 09/13/23 0412  NA 140  K 4.6  CL 108  CO2 28  BUN 17  CREATININE 0.94  CALCIUM  9.3  GLUCOSE 90    Discharge Medications:  Allergies as of 09/13/2023       Reactions   Beef-derived Drug Products Anaphylaxis   After tick bite, cannot eat beef, pork or lamb  Lambs Quarters Anaphylaxis   After tick bite cannot eat beef, pork or lamb   Mucinex [guaifenesin Er] Anaphylaxis   Pork-derived Products Anaphylaxis   After tick bite cannot eat beef, pork or lamb   Darvon [propoxyphene] Other (See Comments)   Hallucinations   Ace Inhibitors Swelling, Cough   Pedal Edema   Hydrocodone Itching   Ppd [tuberculin Purified Protein Derivative]    Always has positive testing to PPD, do not use   Hydromet [hydrocodone Bit-homatrop Mbr] Itching, Other (See Comments)   Severe stomach pain-face itches.    Sulfonamide Derivatives Rash        Medication List     TAKE these medications    busPIRone  10 MG  tablet Commonly known as: BUSPAR  Take 2 tablets (20 mg total) by mouth 2 (two) times daily.   cholecalciferol 25 MCG (1000 UNIT) tablet Commonly known as: VITAMIN D3 Take 1,000 Units by mouth daily.   diltiazem  120 MG 24 hr capsule Commonly known as: Cardizem  CD Take 1 capsule (120 mg total) by mouth daily. What changed:  when to take this Another medication with the same name was removed. Continue taking this medication, and follow the directions you see here.   diltiazem  30 MG tablet Commonly known as: CARDIZEM  TAKE ONE TO TWO TABLETS BY MOUTH EVERY 6 HOURS AS NEEDED FOR FAST HEART RATES   dofetilide 500 MCG capsule Commonly known as: TIKOSYN Take 1 capsule (500 mcg total) by mouth 2 (two) times daily.   Eliquis  5 MG Tabs tablet Generic drug: apixaban  TAKE ONE TABLET BY MOUTH TWICE DAILY   EPINEPHrine  0.3 mg/0.3 mL Soaj injection Commonly known as: EPI-PEN Inject 0.3 mg into the muscle as needed for anaphylaxis (then call 911/ go to ER).   eszopiclone  1 MG Tabs tablet Commonly known as: LUNESTA  Take 0.5-1 mg by mouth at bedtime as needed for sleep. Take immediately before bedtime   ferrous sulfate  325 (65 FE) MG tablet Take 325 mg by mouth daily with breakfast.   fluticasone  50 MCG/ACT nasal spray Commonly known as: FLONASE  Place 2 sprays into both nostrils daily. What changed:  when to take this reasons to take this   furosemide  20 MG tablet Commonly known as: LASIX  TAKE 1 TABLET BY MOUTH ONCE DAILY What changed:  when to take this reasons to take this   levothyroxine  50 MCG tablet Commonly known as: SYNTHROID  TAKE ONE TABLET BY MOUTH DAILY BEFORE BREAKFAST   rosuvastatin  10 MG tablet Commonly known as: Crestor  Take 1 tablet (10 mg total) by mouth daily.   sertraline  50 MG tablet Commonly known as: Zoloft  Take 1 tablet (50 mg total) by mouth daily.   telmisartan  80 MG tablet Commonly known as: MICARDIS  Take 1 tablet (80 mg total) by mouth daily.    triamcinolone  cream 0.1 % Commonly known as: KENALOG  Apply 1 Application topically 2 (two) times daily as needed (itchy rash on back/ neck).   Tylenol  8 Hour 650 MG CR tablet Generic drug: acetaminophen  Take 1,300 mg by mouth 2 (two) times daily.   Zepbound  5 MG/0.5ML Pen Generic drug: tirzepatide  Inject 5 mg into the skin once a week.   Zepbound  7.5 MG/0.5ML Pen Generic drug: tirzepatide  Inject 7.5 mg into the skin once a week.   Zepbound  10 MG/0.5ML Pen Generic drug: tirzepatide  Inject 10 mg into the skin once a week.   ZyrTEC Allergy 10 MG tablet Generic drug: cetirizine Take 10 mg by mouth daily.  Disposition:  Home with follow up in AF clinic in 1 week as in AVS.   Duration of Discharge Encounter:  APP time: 33 minutes  Signed, Creighton Doffing, NP-C, AGACNP-BC [facility] - Electrophysiology  09/13/2023, 11:38 AM   I have seen and examined this patient with Creighton Doffing.  Agree with above, note added to reflect my findings.  Patient feeling well without acute complaint.  Tolerating dofetilide without issue.  GEN: No acute distress.   Neck: No JVD Cardiac: RRR, no murmurs, rubs, or gallops.  Respiratory: normal BS bases bilaterally. GI: Soft, nontender, non-distended  MS: No edema; No deformity. Neuro:  Nonfocal  Skin: warm and dry Psych: Normal affect    Paroxysmal atrial fibrillation: Patient is post dofetilide load.  QTc remained stable throughout dofetilide load.  Loren Vicens continue with current management and follow-up in A-fib clinic. Secondary hypercoagulable state: Continue anticoagulation Class III obesity: Body mass index is 53.93 kg/m.  Style modification with diet and exercise encouraged  MD time at discharge: 55 minutes  Zania Kalisz M. Thanvi Blincoe MD 09/13/2023 2:10 PM

## 2023-09-13 NOTE — Progress Notes (Signed)
 EKG from yesterday evening 09/12/2023 reviewed     Shows remains in NSR with stable QTc at 453 ms.  Continue  Tikosyn 500 mcg BID.   Potassium4.6 (05/30 0412) Magnesium   2.2 (05/30 0412) Creatinine, ser  0.94 (05/30 0412)  Plan for home Friday if QTc remains stable    Creighton Doffing, NP-C, AGACNP-BC [facility] - Electrophysiology  09/13/2023, 6:36 AM

## 2023-09-13 NOTE — Progress Notes (Signed)
 Discharge instructions (including medications) discussed with and copy provided to patient/caregiver. Verbalized understanding.   PIV x1 removed and patient dressed herself. TOC medications retrieved at discharge.

## 2023-09-13 NOTE — Plan of Care (Signed)

## 2023-09-13 NOTE — Discharge Instructions (Signed)
 Dofetilide Capsules What is this medication? DOFETILIDE (doe FET il ide) treats a fast or irregular heartbeat (arrhythmia). It works by slowing down overactive electric signals in the heart, which stabilizes your heart rhythm. It belongs to a group of medications called antiarrhythmics. This medicine may be used for other purposes; ask your health care provider or pharmacist if you have questions. COMMON BRAND NAME(S): Tikosyn What should I tell my care team before I take this medication? They need to know if you have any of these conditions: Heart disease History of irregular heartbeat History of low levels of potassium or magnesium in the blood Kidney disease Liver disease An unusual or allergic reaction to dofetilide, other medications, foods, dyes, or preservatives Pregnant or trying to get pregnant Breast-feeding How should I use this medication? Take this medication by mouth with a glass of water. Follow the directions on the prescription label. Do not take with grapefruit juice. You can take it with or without food. If it upsets your stomach, take it with food. Take your medication at regular intervals. Do not take it more often than directed. Do not stop taking except on your care team's advice. A special MedGuide will be given to you by the pharmacist with each prescription and refill. Be sure to read this information carefully each time. Talk to your care team about the use of this medication in children. Special care may be needed. Overdosage: If you think you have taken too much of this medicine contact a poison control center or emergency room at once. NOTE: This medicine is only for you. Do not share this medicine with others. What if I miss a dose? If you miss a dose, skip it. Take your next dose at the normal time. Do not take extra or 2 doses at the same time to make up for the missed dose. What may interact with this medication? Do not take this medication with any of the  following: Benadryl (Diphenhydramine) Cimetidine Cisapride Dolutegravir Dronedarone Erdafitinib Hydrochlorothiazide Immodium Ketoconazole Megestrol Pimozide Prochlorperazine Thioridazine Trimethoprim Verapamil This medication may also interact with the following: Amiloride Cannabinoids Certain antibiotics like erythromycin or clarithromycin Certain antiviral medications for HIV or hepatitis Certain medications for depression, anxiety, or psychotic disorders Digoxin Diltiazem Grapefruit juice Metformin Nefazodone Other medications that prolong the QT interval (an abnormal heart rhythm) Quinine Triamterene Zafirlukast Ziprasidone This list may not describe all possible interactions. Give your health care provider a list of all the medicines, herbs, non-prescription drugs, or dietary supplements you use. Also tell them if you smoke, drink alcohol, or use illegal drugs. Some items may interact with your medicine. What should I watch for while using this medication? Your condition will be monitored carefully while you are receiving this medication. What side effects may I notice from receiving this medication? Side effects that you should report to your care team as soon as possible: Allergic reactions--skin rash, itching, hives, swelling of the face, lips, tongue, or throat Chest pain Heart rhythm changes--fast or irregular heartbeat, dizziness, feeling faint or lightheaded, chest pain, trouble breathing Side effects that usually do not require medical attention (report to your care team if they continue or are bothersome): Dizziness Headache Nausea Stomach pain Trouble sleeping This list may not describe all possible side effects. Call your doctor for medical advice about side effects. You may report side effects to FDA at 1-800-FDA-1088. Where should I keep my medication? Keep out of the reach of children. Store at room temperature between 15 and 30 degrees  C (59 and 86  degrees F). Throw away any unused medication after the expiration date. NOTE: This sheet is a summary. It may not cover all possible information. If you have questions about this medicine, talk to your doctor, pharmacist, or health care provider.  2024 Elsevier/Gold Standard (2021-03-03 00:00:00)

## 2023-09-16 ENCOUNTER — Telehealth: Payer: Self-pay | Admitting: *Deleted

## 2023-09-16 NOTE — Transitions of Care (Post Inpatient/ED Visit) (Signed)
   09/16/2023  Name: Rebekah [name] MRN: [id number] DOB: 05-Jul-1955  Today's TOC FU Call Status: Today's TOC FU Call Status:: Unsuccessful Call (1st Attempt) Unsuccessful Call (1st Attempt) Date: 09/16/23  Attempted to reach the patient regarding the most recent Inpatient/ED visit.  Follow Up Plan: Additional outreach attempts will be made to reach the patient to complete the Transitions of Care (Post Inpatient/ED visit) call.   Cecilie Coffee [facility], BSN RN Care Manager/ Transition of Care [place]/ [facility] [phone]

## 2023-09-16 NOTE — Transitions of Care (Post Inpatient/ED Visit) (Signed)
 09/16/2023  Name: Rebekah [name] MRN: [id number] DOB: 1955/10/18  Today's TOC FU Call Status: Today's TOC FU Call Status:: Successful TOC FU Call Completed TOC FU Call Complete Date: 09/16/23 Patient's Name and Date of Birth confirmed.  Transition Care Management Follow-up Telephone Call Date of Discharge: 09/13/23 Discharge Facility: [facility]) Type of Discharge: Inpatient Admission Primary Inpatient Discharge Diagnosis:: Paroxysmal atrial fibrillation How have you been since you were released from the hospital?:  (eating, drinking well, no issues with bowel/ bladder, has walker and cane on hand but not using)  Items Reviewed: Did you receive and understand the discharge instructions provided?: Yes Medications obtained,verified, and reconciled?: Yes (Medications Reviewed) Any new allergies since your discharge?: No Dietary orders reviewed?: Yes Type of Diet Ordered:: low sodium   heart healthy Do you have support at home?: Yes People in Home [RPT]: spouse Name of Support/Comfort Primary Source: Nonah Bayley Reviewed atrial fibrillation action plan  Medications Reviewed Today: Medications Reviewed Today     Reviewed by Daralyn Earl, RN (Registered Nurse) on 09/16/23 at 1118  Med List Status: <None>   Medication Order Taking? Sig Documenting Provider Last Dose Status Informant  acetaminophen  (TYLENOL  8 HOUR) 650 MG CR tablet [id number] Yes Take 1,300 mg by mouth 2 (two) times daily. [provider] Taking Active Self, Pharmacy Records  busPIRone  (BUSPAR ) 10 MG tablet [id number] Yes Take 2 tablets (20 mg total) by mouth 2 (two) times daily. Eliodoro Guerin, DO Taking Active Self, Pharmacy Records  cetirizine (ZYRTEC ALLERGY) 10 MG tablet [id number] Yes Take 10 mg by mouth daily. [provider] Taking Active Self, Pharmacy Records  cholecalciferol (VITAMIN D3) 25 MCG (1000 UNIT) tablet [id number] Yes Take 1,000 Units by mouth daily. [provider]  Taking Active Self, Pharmacy Records  diltiazem  (CARDIZEM  CD) 120 MG 24 hr capsule [id number] Yes Take 1 capsule (120 mg total) by mouth daily. Thomasena Fleming, NP Taking Active   diltiazem  (CARDIZEM ) 30 MG tablet [id number] Yes TAKE ONE TO TWO TABLETS BY MOUTH EVERY 6 HOURS AS NEEDED FOR FAST HEART RATES Hilty, Aviva Lemmings, MD Taking Active Self, Pharmacy Records  dofetilide  (TIKOSYN ) 500 MCG capsule [id number] Yes Take 1 capsule (500 mcg total) by mouth 2 (two) times daily. Thomasena Fleming, NP Taking Active   ELIQUIS  5 MG TABS tablet [id number] Yes TAKE ONE TABLET BY MOUTH TWICE DAILY Hilty, Aviva Lemmings, MD Taking Active Self, Pharmacy Records  EPINEPHrine  0.3 mg/0.3 mL IJ SOAJ injection [id number] No Inject 0.3 mg into the muscle as needed for anaphylaxis (then call 911/ go to ER).  Patient not taking: Reported on 09/16/2023   Eliodoro Guerin, DO Not Taking Active Self, Pharmacy Records  eszopiclone  (LUNESTA ) 1 MG TABS tablet [id number] No Take 0.5-1 mg by mouth at bedtime as needed for sleep. Take immediately before bedtime  Patient not taking: Reported on 09/16/2023   [provider] Not Taking Active Self, Pharmacy Records           Med Note Micah Ade   Tue Sep 10, 2023 12:31 PM) Berniece Brisk to not take. Has only had "part" of a tablet within past 30 days.   ferrous sulfate  325 (65 FE) MG tablet [id number] Yes Take 325 mg by mouth daily with breakfast. [provider] Taking Active Self, Pharmacy Records  fluticasone  (FLONASE ) 50 MCG/ACT nasal spray [id number] Yes Place 2 sprays into both nostrils daily.  Patient taking differently: Place 2 sprays into both nostrils as needed for  allergies.   Eliodoro Guerin, DO Taking Active Self, Pharmacy Records  furosemide  (LASIX ) 20 MG tablet [id number]  TAKE 1 TABLET BY MOUTH ONCE DAILY  Patient taking differently: Take 20 mg by mouth daily as needed for fluid.   Hazle Lites, MD  Active Self, Pharmacy Records           Med Note  Micah Ade   Tue Sep 10, 2023 12:27 PM) States she tries to take "fairly often"  levothyroxine  (SYNTHROID ) 50 MCG tablet [id number] Yes TAKE ONE TABLET BY MOUTH DAILY BEFORE Donal Frohlich, DO Taking Active Self, Pharmacy Records  rosuvastatin  (CRESTOR ) 10 MG tablet [id number] Yes Take 1 tablet (10 mg total) by mouth daily. Eliodoro Guerin, DO Taking Active Self, Pharmacy Records  sertraline  (ZOLOFT ) 50 MG tablet [id number] Yes Take 1 tablet (50 mg total) by mouth daily. Eliodoro Guerin, DO Taking Active Self, Pharmacy Records  telmisartan  (MICARDIS ) 80 MG tablet [id number] Yes Take 1 tablet (80 mg total) by mouth daily. Eliodoro Guerin, DO Taking Active Self, Pharmacy Records  tirzepatide  (ZEPBOUND ) 10 MG/0.5ML Pen [id number] No Inject 10 mg into the skin once a week.  Patient not taking: Reported on 09/16/2023   Eliodoro Guerin, DO Not Taking Active Self, Pharmacy Records  tirzepatide  (ZEPBOUND ) 5 MG/0.5ML Pen [id number] No Inject 5 mg into the skin once a week.  Patient not taking: Reported on 09/16/2023   [provider] Not Taking Active Pharmacy Records, Self  tirzepatide  (ZEPBOUND ) 7.5 MG/0.5ML Pen [id number] Yes Inject 7.5 mg into the skin once a week. Eliodoro Guerin, DO Taking Active Self, Pharmacy Records  triamcinolone  cream (KENALOG ) 0.1 % [id number] No Apply 1 Application topically 2 (two) times daily as needed (itchy rash on back/ neck).  Patient not taking: Reported on 09/16/2023   Eliodoro Guerin, DO Not Taking Active Self, Pharmacy Records            Home Care and Equipment/Supplies: Were Home Health Services Ordered?: No Any new equipment or medical supplies ordered?: No  Functional Questionnaire: Do you need assistance with bathing/showering or dressing?: No Do you need assistance with meal preparation?: No Do you need assistance with eating?: No Do you have difficulty maintaining continence: No Do you need assistance with  getting out of bed/getting out of a chair/moving?: No Do you have difficulty managing or taking your medications?: No  Follow up appointments reviewed: PCP Follow-up appointment confirmed?: Yes Date of PCP follow-up appointment?: 10/21/23 Follow-up Provider: Vicky Grange Specialist [facility] Follow-up appointment confirmed?: Yes Date of Specialist follow-up appointment?: 09/20/23 Follow-Up Specialty Provider:: Atrial Fibrillation clinic @ 830 am     Cardiologit Dr. Dinah Franco  6/18 @ 920 am Do you need transportation to your follow-up appointment?: No Do you understand care options if your condition(s) worsen?: Yes-patient verbalized understanding  SDOH Interventions Today    Flowsheet Row Most Recent Value  SDOH Interventions   Food Insecurity Interventions Intervention Not Indicated  Housing Interventions Intervention Not Indicated  Transportation Interventions Intervention Not Indicated  Utilities Interventions Intervention Not Indicated       Cecilie Coffee [facility], BSN RN Care Manager/ Transition of Care [place]/ [facility] Population [facility] [phone]

## 2023-09-20 ENCOUNTER — Ambulatory Visit (HOSPITAL_COMMUNITY)
Admit: 2023-09-20 | Discharge: 2023-09-20 | Disposition: A | Discharge status: Home / Self Care | Attending: Internal Medicine | Admitting: Internal Medicine

## 2023-09-20 VITALS — BP 136/86 | HR 61 | Ht 61.0 in | Wt 284.2 lb

## 2023-09-20 DIAGNOSIS — D6869 Other thrombophilia: Secondary | ICD-10-CM

## 2023-09-20 DIAGNOSIS — Z79899 Other long term (current) drug therapy: Secondary | ICD-10-CM

## 2023-09-20 DIAGNOSIS — Z5181 Encounter for therapeutic drug level monitoring: Secondary | ICD-10-CM | POA: Diagnosis not present

## 2023-09-20 DIAGNOSIS — I480 Paroxysmal atrial fibrillation: Secondary | ICD-10-CM

## 2023-09-20 DIAGNOSIS — I4891 Unspecified atrial fibrillation: Secondary | ICD-10-CM

## 2023-09-20 MED ORDER — DOFETILIDE 250 MCG PO CAPS
250.0000 ug | ORAL_CAPSULE | Freq: Two times a day (BID) | ORAL | 3 refills | Status: DC
Start: 2023-09-20 — End: 2023-12-24

## 2023-09-20 NOTE — Patient Instructions (Signed)
 Decrease dofetilide  250 mcg twice daily

## 2023-09-20 NOTE — Progress Notes (Signed)
 Primary Care Physician: Eliodoro Guerin, DO Primary Cardiologist: Hazle Lites, MD Electrophysiologist: Will Cortland Ding, MD  Referring Physician: Dr Alvis Ba    Rebekah [name] is a 68 y.o. female with a history of CKD, HTN, OSA, atrial fibrillation who presents for follow up in the [facility]. Patient is on Eliquis  for stroke prevention. She has been maintained on flecainide  for rhythm control.  Patient returns for follow up for atrial fibrillation and flecainide  monitoring. She reports that she was in afib for about 2 weeks in the setting of an acute GI illness. Otherwise, she has been maintaining SR the majority of the time. No bleeding issues on anticoagulation.   On follow up 09/10/23, patient is here for Tikosyn  admission. She stopped flecainide  3 days prior to today's admission. No new medications since last OV. No benadryl  use. No missed doses of anticoagulant.   On follow up 09/20/23, patient is here for Tikosyn  surveillance. S/p Tikosyn  admission 5/27-30/2025. Patient did not require cardioversion. Discharged on 500 mcg BID. Diltiazem  reduced to 120 mg daily.   Today, she  denies symptoms of palpitations, chest pain, shortness of breath, orthopnea, PND, lower extremity edema, dizziness, presyncope, syncope, bleeding, or neurologic sequela. The patient is tolerating medications without difficulties and is otherwise without complaint today.    Atrial Fibrillation Risk Factors:  she does have symptoms or diagnosis of sleep apnea. she does not have a history of rheumatic fever.   Atrial Fibrillation Management history:  Previous antiarrhythmic drugs: flecainide , tikosyn  Previous cardioversions: none Previous ablations: none Anticoagulation history: Eliquis   ROS- All systems are reviewed and negative except as per the HPI above.  Past Medical History:  Diagnosis Date   Allergy    Multiple   Anxiety    Arthritis    Atrial fibrillation  with rapid ventricular response (HCC) 12/26/2021   Atrial fibrillation with RVR (HCC) 12/27/2021   Bilateral bunions    Cataract    Both eyes- lenses replaced   Chronic kidney disease 2022   Not kidney disease but kidney stone   Complication of anesthesia    Depression    More anxiety-worry-stress   Difficulty sleeping    prior sleep study did not reveal sleep apnea per patient   DJD (degenerative joint disease)    Dyspnea    Dysrhythmia    a-fib   Environmental allergies    Food allergy    Gallbladder problem    GERD (gastroesophageal reflux disease)    Heart murmur    History of kidney stones    Hypertension    no meds after weight loss   Hypothyroidism    Incontinence of urine    at nite    Joint pain    Left atrial dilatation    Left foot pain    Leg edema    Obesity    s/p gastric sleeve 12/2013 (previously weighed close to 400 lbs)   Obstructive sleep apnea 12/07/2022   Osteoarthritis    Oxygen deficiency    3L/min for sleep due to slowed reaperations during sleep   Palpitations    Paroxysmal atrial fibrillation (HCC)    Pneumonia    hx of    PONV (postoperative nausea and vomiting)    Status post bilateral knee replacements    Supplemental oxygen dependent    uses 2l/[place] at night, states HR goes low and O2 drops   Tuberculosis    had 6 month of INH due to exposure  Vaginal vault prolapse     Current Outpatient Medications  Medication Sig Dispense Refill   acetaminophen  (TYLENOL  8 HOUR) 650 MG CR tablet Take 1,300 mg by mouth 2 (two) times daily.     busPIRone  (BUSPAR ) 10 MG tablet Take 2 tablets (20 mg total) by mouth 2 (two) times daily. 360 tablet 0   cetirizine (ZYRTEC ALLERGY) 10 MG tablet Take 10 mg by mouth daily.     cholecalciferol (VITAMIN D3) 25 MCG (1000 UNIT) tablet Take 1,000 Units by mouth daily.     diltiazem  (CARDIZEM  CD) 120 MG 24 hr capsule Take 1 capsule (120 mg total) by mouth daily.     diltiazem  (CARDIZEM ) 30 MG tablet TAKE ONE TO  TWO TABLETS BY MOUTH EVERY 6 HOURS AS NEEDED FOR FAST HEART RATES 30 tablet 9   dofetilide  (TIKOSYN ) 500 MCG capsule Take 1 capsule (500 mcg total) by mouth 2 (two) times daily. 60 capsule 6   ELIQUIS  5 MG TABS tablet TAKE ONE TABLET BY MOUTH TWICE DAILY 180 tablet 1   EPINEPHrine  0.3 mg/0.3 mL IJ SOAJ injection Inject 0.3 mg into the muscle as needed for anaphylaxis (then call 911/ go to ER). 2 each 3   ferrous sulfate  325 (65 FE) MG tablet Take 325 mg by mouth daily with breakfast.     furosemide  (LASIX ) 20 MG tablet TAKE 1 TABLET BY MOUTH ONCE DAILY (Patient taking differently: Take 20 mg by mouth daily as needed for fluid.) 90 tablet 3   levothyroxine  (SYNTHROID ) 50 MCG tablet TAKE ONE TABLET BY MOUTH DAILY BEFORE BREAKFAST 90 tablet 0   rosuvastatin  (CRESTOR ) 10 MG tablet Take 1 tablet (10 mg total) by mouth daily. 90 tablet 3   sertraline  (ZOLOFT ) 50 MG tablet Take 1 tablet (50 mg total) by mouth daily. 90 tablet 3   telmisartan  (MICARDIS ) 80 MG tablet Take 1 tablet (80 mg total) by mouth daily. 90 tablet 3   tirzepatide  (ZEPBOUND ) 7.5 MG/0.5ML Pen Inject 7.5 mg into the skin once a week. 2 mL 0   triamcinolone  cream (KENALOG ) 0.1 % Apply 1 Application topically 2 (two) times daily as needed (itchy rash on back/ neck). 80 g 0   eszopiclone  (LUNESTA ) 1 MG TABS tablet Take 0.5-1 mg by mouth at bedtime as needed for sleep. Take immediately before bedtime (Patient not taking: Reported on 09/20/2023)     tirzepatide  (ZEPBOUND ) 10 MG/0.5ML Pen Inject 10 mg into the skin once a week. (Patient not taking: Reported on 09/16/2023) 2 mL 0   No current facility-administered medications for this encounter.    Physical Exam: BP 136/86   Pulse 61   Ht 5\' 1"  (1.549 m)   Wt 128.9 kg   BMI 53.70 kg/m   GEN- The patient is well appearing, alert and oriented x 3 today.   Neck - no JVD or carotid bruit noted Lungs- Clear to ausculation bilaterally, normal work of breathing Heart- Regular rate and rhythm,  no murmurs, rubs or gallops, PMI not laterally displaced Extremities- no clubbing, cyanosis, or edema Skin - no rash or ecchymosis noted   Wt Readings from Last 3 Encounters:  09/20/23 128.9 kg  09/10/23 129.5 kg  09/10/23 129 kg     EKG today demonstrates  Vent. rate 61 BPM PR interval 148 ms QRS duration 142 ms QT/QTcB 540/543 ms P-R-T axes 44 -29 18 Normal sinus rhythm Right bundle branch block Anterior infarct , age undetermined Abnormal ECG When compared with ECG of 13-Sep-2023 10:06, Premature atrial  complexes are no longer Present Anterior infarct is now Present QT has lengthened   Echo 10/08/22 demonstrated   1. Left ventricular ejection fraction, by estimation, is 65 to 70%. The  left ventricle has normal function. The left ventricle has no regional  wall motion abnormalities. There is mild left ventricular hypertrophy.  Left ventricular diastolic parameters were normal.   2. Right ventricular systolic function is normal. The right ventricular  size is normal. There is normal pulmonary artery systolic pressure. The  estimated right ventricular systolic pressure is 32.6 mmHg.   3. The mitral valve is normal in structure. Trivial mitral valve  regurgitation. No evidence of mitral stenosis.   4. The aortic valve was not well visualized. Aortic valve regurgitation  is trivial. No aortic stenosis is present.   5. Aortic dilatation noted. There is dilatation of the ascending aorta,  measuring 42 mm.   6. The inferior vena cava is normal in size with greater than 50%  respiratory variability, suggesting right atrial pressure of 3 mmHg.    CHA2DS2-VASc Score = 3  The patient's score is based upon: CHF History: 0 HTN History: 1 Diabetes History: 0 Stroke History: 0 Vascular Disease History: 0 Age Score: 1 Gender Score: 1       ASSESSMENT AND PLAN: Paroxysmal Atrial Fibrillation (ICD10:  I48.0) The patient's CHA2DS2-VASc score is 3, indicating a 3.2% annual  risk of stroke.   Flecainide  discontinued due to QRS widening. S/p Tikosyn  admission 5/27-30/2025.  She is currently in NSR. Her QT interval has unfortunately prolonged. She will require a permanent reduction in her Tikosyn  dosage down to 250 mcg BID. Begin 250 mcg dose tonight, recheck ECG Monday morning.  High risk medication monitoring (ICD10: [id number]) Patient requires ongoing monitoring for anti-arrhythmic medication which has the potential to cause life threatening arrhythmias or AV block. Qtc is prolonged, will reduce Tikosyn  to 250 mcg BID. Recheck ECG Monday. Bmet and mag drawn today.   Secondary Hypercoagulable State (ICD10:  D68.69) The patient is at significant risk for stroke/thromboembolism based upon her CHA2DS2-VASc Score of 3.  Continue Apixaban  (Eliquis ).  No bleeding issues.    HTN Stable today  Obesity Body mass index is 53.7 kg/m.  Encouraged daily walking  OSA  Encouraged nightly CPAP Followed by Dr Baldwin Levee   Repeat ECG Monday. Follow up as scheduled in 1 month for Tikosyn  surveillance.      Woody Heading, PA-C [facility] [address] [place], [place] [address] [phone]

## 2023-09-21 LAB — BASIC METABOLIC PANEL WITH GFR
BUN/Creatinine Ratio: 16 (ref 12–28)
BUN: 17 mg/dL (ref 8–27)
CO2: 25 mmol/L (ref 20–29)
Calcium: 10.2 mg/dL (ref 8.7–10.3)
Chloride: 98 mmol/L (ref 96–106)
Creatinine, Ser: 1.05 mg/dL — ABNORMAL HIGH (ref 0.57–1.00)
Glucose: 91 mg/dL (ref 70–99)
Potassium: 4.7 mmol/L (ref 3.5–5.2)
Sodium: 139 mmol/L (ref 134–144)
eGFR: 58 mL/min/{1.73_m2} — ABNORMAL LOW (ref >59)

## 2023-09-21 LAB — MAGNESIUM: Magnesium: 2.3 mg/dL (ref 1.6–2.3)

## 2023-09-23 ENCOUNTER — Ambulatory Visit (HOSPITAL_COMMUNITY): Payer: Self-pay | Admitting: Internal Medicine

## 2023-09-23 ENCOUNTER — Ambulatory Visit (HOSPITAL_COMMUNITY)
Admission: RE | Admit: 2023-09-23 | Discharge: 2023-09-23 | Disposition: A | Discharge status: Home / Self Care | Source: Ambulatory Visit | Attending: Internal Medicine | Admitting: Internal Medicine

## 2023-09-23 VITALS — HR 63

## 2023-09-23 DIAGNOSIS — I4891 Unspecified atrial fibrillation: Secondary | ICD-10-CM | POA: Diagnosis not present

## 2023-09-23 MED ORDER — DILTIAZEM HCL 30 MG PO TABS: 30.0000 mg | ORAL_TABLET | Freq: Two times a day (BID) | ORAL | 5 refills | Status: DC

## 2023-09-23 NOTE — Patient Instructions (Signed)
 Stop 120 mg diltiazem   Start 30 mg diltiazem  twice daily

## 2023-09-23 NOTE — Progress Notes (Signed)
 Patient came for 1 week Tikosyn  surveillance on 6/6 and QT prolonged; dose of Tikosyn  reduced to 250 mcg BID. She is here today for ECG recheck.   ECG today shows  Vent. rate 63 BPM PR interval 148 ms QRS duration 138 ms QT/QTcB 462/472 ms P-R-T axes 28 -16 7 Normal sinus rhythm Right bundle branch block Abnormal ECG When compared with ECG of 20-Sep-2023 08:42, Criteria for Anterior infarct are no longer Present QT has shortened   Continue Tikosyn  250 mcg BID. Patient notes fatigue with resting HR in the 40s. Will stop diltiazem  120 mg daily. Begin diltiazem  30 mg BID. Patient will follow up as scheduled for 1 month Tikosyn  surveillance.

## 2023-10-02 ENCOUNTER — Ambulatory Visit: Payer: Medicare PPO | Attending: Internal Medicine | Admitting: Internal Medicine

## 2023-10-02 VITALS — BP 118/79 | HR 91 | Ht 61.0 in | Wt 275.0 lb

## 2023-10-02 DIAGNOSIS — I4510 Unspecified right bundle-branch block: Secondary | ICD-10-CM

## 2023-10-02 DIAGNOSIS — I10 Essential (primary) hypertension: Secondary | ICD-10-CM | POA: Diagnosis not present

## 2023-10-02 DIAGNOSIS — Z5181 Encounter for therapeutic drug level monitoring: Secondary | ICD-10-CM | POA: Diagnosis not present

## 2023-10-02 DIAGNOSIS — Z79899 Other long term (current) drug therapy: Secondary | ICD-10-CM

## 2023-10-02 DIAGNOSIS — I480 Paroxysmal atrial fibrillation: Secondary | ICD-10-CM | POA: Diagnosis not present

## 2023-10-02 DIAGNOSIS — E785 Hyperlipidemia, unspecified: Secondary | ICD-10-CM

## 2023-10-02 NOTE — Progress Notes (Signed)
 OFFICE NOTE  Chief Complaint:  Occasional palpitations  Primary Care Physician: Eliodoro Guerin, DO  HPI:  Rebekah [name] is a pleasant 68 year old female with a past medical history significant for her super morbid obesity, chronic hypoxia, hypertension, GERD and prior knee replacement. She was previously followed by Dr. Ed Gondola and was being evaluated for bariatric surgery. She is here to establish new care with me today and for preoperative evaluation. She is a former Engineer, civil (consulting) and worked at [employer]. Unfortunately she's had significant weight gain over the past several years and is now significantly affecting her quality of life. She appears he seen Dr. Ed Gondola will order an echocardiogram on August of 2014 which showed moderate thickening of the left ventricle with EF of 60-65%, mildly increased RV systolic pressure and no significant valvular disease. There was left atrial enlargement. She denies any chest pain but does have expected shortness of breath with exertion. Lower extremity venous Dopplers were performed which show no evidence of significant venous insufficiency.    Rebekah [name] returns today for follow-up. She seems to be doing remarkably well. She underwent gastric sleeve surgery and has had close to 60 pound weight loss. Unfortunately she was complicated by postoperative atrial fibrillation. She was seen by Dr. [place] and placed on Cardizem . Subsequently she converted to sinus rhythm within 24 hours spontaneously. She's had no recurrence since that time and has not been additionally anticoagulated. She was not continued on Cardizem . She does have an elevated CHADSVASC score of 2.  I saw Rebekah [name] back in the office today. Please see her recent notes when I saw her in the emergency room at [facility]. She was noted to be in atrial fibrillation again and given flecainide  to help convert her, but this was not successful. She is placed on diltiazem  and in addition to  metoprolol . Rate slowed down and eventually she cardioverted on her own. We also started her on Xarelto  which she's continues to take. Overall she's had very good rate control his heart rate is remained in the mid 40s to mid 60s. Blood pressure is somewhat labile and ranges between the low 90s up to the 140s. Overall she feels fairly well. She still has a few episodes of palpitations during the week when she feels that she is in A. fib although she feels like she is in A. fib today and her rhythm is sinus. She may not be sensitive to A. fib anymore. 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  I saw Rebekah [name] back in the office today. She's had remarkable weight loss. In fact her weight after sleeve gastrectomy is down 288 pounds. Her main issue now is excess tissue for which she's tried to get plastic surgery but has been denied multiple times. She reports no further palpitations or atrial fibrillation. She continues to take Xarelto  without any bleeding complications. She continues to use nocturnal oxygen due to hypoxia, although her sleep study indicated no sleep apnea, her overnight oximetry study did indicate hypoxia with a low O2 sats ration of 86%. This was, of course prior to the recent 80-100 pound weight loss. She also reports recently she's been having epistaxis. In fact she had an episode that lasted for 10-12 hours, she used a box of 40 micro-tampons to try to get it to stop and then eventually tried to go to the emergency room. She is using nasal saline but feels that the oxygen at night is probably drying  her nose out.  [name] was seen today for an urgent add-on. This morning she was awakened by her alarm clock around 5 AM and noticed that her heart was racing when she woke up. It did not slow down right away and she felt increasingly more fatigue. She said after taking a shower she felt dizzy and had some chest pressure. Her heart rate was very elevated and she tried to take  it noting it was greater than 200. She decided then take 300 mg of flecainide . Subsequently she took 2 diltiazem 's and after several hours he decided to come in for her appointment with the orthopedist. She says that on the way in to the office her heart rate slowed down and she felt better. When she saw her orthopedist they felt that she should be reevaluated by us  here. Currently she is asymptomatic. EKG was performed showing sinus rhythm with sinus arrhythmia at 81.  Mrs. Blackston returns today for follow-up. She had been reporting some worsening leg swelling however this seems to be improved somewhat today. She has had no recurrent paroxysmal arrhythmias. She continues to want to use the pocket pill strategy.  11/21/2016  Rebekah [name] returns a for follow-up. Is been over year since I last saw her. She was last seen in the A. fib clinic for follow-up and is reported very infrequent use of flecainide . She says that she's used it a few times over the past year and has been successful in converting her to sinus rhythm. Of most notable is that she's had significant weight gain. Weight in March 2017 was 188 pounds and now up to 271 pounds. She was 204 pounds in January. She says this is typical to many factors including immobility after foot surgery, social stressors, brother who had a stroke and poor eating habits. She has a significant understanding of nutrition and her dietary needs but has had difficulty balancing that. She reports that she is consuming only 600-700 cal a day and has lost 20 pounds but has recently plateaued.  03/15/2017  Rebekah [name] returns today for follow-up.  I last saw her about 3 months ago.  We were working on her blood pressure which has been elevated.  Mostly it appears that her blood pressure is elevated in the morning.  She does not have sleep apnea however has had infrequent nocturnal oxygen desaturations and uses oxygen at night.  This may be contributing to her elevated blood  pressure in the morning.  Recently she has been taking extra losartan  50 mg at night but then takes 25 mg in the morning.  I reviewed her blood home blood pressures and they appear to be well controlled between 100-130 systolic and the 16X and 70s diastolic.  She reports infrequent atrial fibrillation.  The episodes however now are longer lasting and may last throughout the day, despite taking her flecainide .  They eventually go away however it is not clear if it works as quickly.  We considered and discussed monitoring and I presented to her an option of using the alive core application.  01/03/2018  Rebekah [name] is seen today in follow-up.  She reported recently having had some occasional palpitations.  She thought she experienced an allergic reaction and took up 200 mg of Benadryl  over 4 hours.  Absolutely she had some A. fib and then she took her flecainide  within 24 hours.  I strongly advised against this at this point as there is significant interaction between Benadryl  and flecainide  which could potentiate  the levels of flecainide  and potentially cause QT prolongation.  He had considered using her EpiPen , and I supported this if she ever feels that she may be having anaphylaxis.  Overall, she denies any chest pain or worsening shortness of breath.  She is been working with the weight management center and unfortunately has not lost a lot of weight but recently started on the original Atkins diet and has had some benefit there.  Blood pressures were running slightly higher and she increased her medicine dose to 50 mg twice daily of the losartan .  I reviewed her home blood pressure readings and the appear to be well controlled.  She is tolerating Xarelto  without any difficulty.  And as I mentioned she is only had a couple of episodes of A. fib for which she has needed the pocket pill flecainide .  12/23/2018  Rebekah [name] is seen today in follow-up.  She has had a couple episodes of persistent atrial  fibrillation despite taking flecainide  and diltiazem .  Overall though she says her episodes are short and typically occur less than 1 time a month.  Her blood pressure is elevated today however she feels there is a whitecoat component to this.  Her home blood pressures were reviewed and seem to be normal.  Recently she has had some readings suggestive of some daytime hypoxia.  She continues to use some oxygen at night although feels no different on the days she does not use it.  She did have a repeat echocardiogram which showed normal systolic function and no evidence of pulmonary hypertension.  For some reason her oxygen saturations have improved recently.  She continues to lose some weight and is working with Dr. Alvia Awkward.    08/21/2021  Rebekah [name] returns today for follow-up.  Recently she saw Kathyrn Lawrence, DNP who started her on daily flecainide  at my direction because of increases in her palpitations and pocket pill use of high-dose flecainide .  Currently she is on 50 mg twice daily.  In general she says her palpitations have actually improved.  She is also not needed breakthrough diltiazem .  I advised her to not use the breakthrough flecainide  since she is on daily dosing.  She has continued struggle with weight.  She is working with [employer].D. in her practice on some medical therapy for this.  She was previously seeing Dr. Alvia Awkward however felt like the weight loss center recommendations did not work well with her prior gastric bypass surgery.  She just underwent stress testing which was negative for ischemia and showed normal LV function.  02/26/2022  Rebekah [name] returns today for follow-up.  Back in September she was hospitalized with COVID.  She was given Paxlovid and says that she had development of tachycardia and atrial fibrillation.  She was given an extra dose of flecainide  and ultimately converted within about 24 hours just prior to plans for TEE guided cardioversion.  Since then she is recovered  although does feel fatigue and some shortness of breath.  She says she does not sleep well.  She has some mild persistent cough.  She had several episodes of short breakthrough A-fib for a few weeks but over the past month she has done well without it.  She has noted that her heart rate remains elevated.  10/02/2023  Rebekah [name] is seen today in follow-up.  Of note she has had a lot of significant events of the last time I saw her.  This included what sounds like sepsis related to ureteral obstruction  with a kidney stone in which she became hypotensive and nearly died.  Fortunately she is feeling somewhat better.  She was ultimately transition from flecainide  over to dofetilide .  She cannot tolerated 500 mcg dose and was recently decreased to 250 mcg.  Repeat EKG showed improved QTc of 473 ms.  We did an EKG today which shows underlying right bundle branch block, sinus rhythm with PACs and a QTc of 493 ms.  She does have follow-up again soon in the EP clinic.  From a cardiac standpoint she remains asymptomatic.  She has had some low blood pressure readings at home including recently in the 90s but she is asymptomatic with this.  She uses a wrist cuff which today seems to correlate with our office blood pressure readings.  PMHx:  Past Medical History:  Diagnosis Date   Allergy    Multiple   Anxiety    Arthritis    Atrial fibrillation with rapid ventricular response (HCC) 12/26/2021   Atrial fibrillation with RVR (HCC) 12/27/2021   Bilateral bunions    Cataract    Both eyes- lenses replaced   Chronic kidney disease 2022   Not kidney disease but kidney stone   Complication of anesthesia    Depression    More anxiety-worry-stress   Difficulty sleeping    prior sleep study did not reveal sleep apnea per patient   DJD (degenerative joint disease)    Dyspnea    Dysrhythmia    a-fib   Environmental allergies    Food allergy    Gallbladder problem    GERD (gastroesophageal reflux disease)     Heart murmur    History of kidney stones    Hypertension    no meds after weight loss   Hypothyroidism    Incontinence of urine    at nite    Joint pain    Left atrial dilatation    Left foot pain    Leg edema    Obesity    s/p gastric sleeve 12/2013 (previously weighed close to 400 lbs)   Obstructive sleep apnea 12/07/2022   Osteoarthritis    Oxygen deficiency    3L/min for sleep due to slowed reaperations during sleep   Palpitations    Paroxysmal atrial fibrillation (HCC)    Pneumonia    hx of    PONV (postoperative nausea and vomiting)    Status post bilateral knee replacements    Supplemental oxygen dependent    uses 2l/[place] at night, states HR goes low and O2 drops   Tuberculosis    had 6 month of INH due to exposure    Vaginal vault prolapse     Past Surgical History:  Procedure Laterality Date   APPENDECTOMY     CHOLECYSTECTOMY     CYSTOSCOPY/URETEROSCOPY/HOLMIUM LASER/STENT PLACEMENT Left 09/29/2020   Procedure: CYSTOSCOPY WITH LEFT RETROGRADE PYELOGRAM AND LEFT URETEROSCOPY/HOLMIUM LASER STONE REMOVAL LEFT /STENT PLACEMENT;  Surgeon: Andrez Banker, MD;  Location: [facility];  Service: Urology;  Laterality: Left;   CYSTOSCOPY/URETEROSCOPY/HOLMIUM LASER/STENT PLACEMENT Left 10/12/2022   Procedure: CYSTOSCOPY LEFT URETEROSCOPY, HOLMIUM LASER LITHOTRIPSY, LEFT URETERAL STENT EXCHANGE;  Surgeon: Andrez Banker, MD;  Location: [facility];  Service: Urology;  Laterality: Left;   EYE SURGERY     03/2014 lens implant left lens    FOOT ARTHRODESIS Left 03/15/2016   Procedure: TALONAVICULAR AND SUBTALAR ARTHRODESIS;  Surgeon: Amada Backer, MD;  Location: [facility];  Service: Orthopedics;  Laterality: Left;   GASTROC RECESSION EXTREMITY Left  03/15/2016   Procedure: LEFT GASTROC RECESSION;  Surgeon: Amada Backer, MD;  Location: [facility];  Service: Orthopedics;  Laterality: Left;   JOINT REPLACEMENT  04/16/1997   L TOTAL KNEE   LAPAROSCOPIC  GASTRIC SLEEVE RESECTION N/A 01/04/2014   Procedure: LAPAROSCOPIC GASTRIC SLEEVE RESECTION;  Surgeon: Aldean Hummingbird, MD;  Location: [facility];  Service: General;  Laterality: N/A;   TONSILLECTOMY     TOTAL KNEE ARTHROPLASTY Right 05/17/2015   Procedure: RIGHT TOTAL KNEE ARTHROPLASTY;  Surgeon: Claiborne Crew, MD;  Location: [facility];  Service: Orthopedics;  Laterality: Right;   TRANSTHORACIC ECHOCARDIOGRAM  11/14/2012   EF 60-65%, mod LVH & mod conc hypertrophy, grade 1 diastolic dysfunction; LA mildly dilated; RV systolic pressure increased; PA peak pressure   TUBAL LIGATION     UPPER GI ENDOSCOPY  01/04/2014   Procedure: UPPER GI ENDOSCOPY;  Surgeon: Aldean Hummingbird, MD;  Location: [facility];  Service: General;;    FAMHx:  Family History  Problem Relation Age of Onset   Diabetes Father    Hyperlipidemia Father    Hypertension Father    Heart disease Father    Sudden death Father    Anxiety disorder Father    Obesity Father    Varicose Veins Father    Uterine cancer Mother    Cervical cancer Mother    Breast cancer Mother    Cancer Mother        cervical and brreast cancer   Hypertension Mother    Hyperlipidemia Mother    Obesity Mother    Arthritis Mother    Diabetes Maternal Grandmother    Obesity Maternal Grandmother    Diabetes Brother    Hypertension Brother    Stroke Brother    Hypertension Brother    Obesity Daughter     SOCHx:   reports that she has never smoked. She has never been exposed to tobacco smoke. She has never used smokeless tobacco. She reports that she does not drink alcohol and does not use drugs.  ALLERGIES:  Allergies  Allergen Reactions   Beef-Derived Drug Products Anaphylaxis    After tick bite, cannot eat beef, pork or lamb   Lambs Quarters Anaphylaxis    After tick bite cannot eat beef, pork or lamb   Mucinex [Guaifenesin Er] Anaphylaxis   Pork-Derived Products Anaphylaxis    After tick bite cannot eat beef, pork or lamb   Darvon  [Propoxyphene] Other (See Comments)    Hallucinations    Ace Inhibitors Swelling and Cough    Pedal Edema   Hydrocodone Itching   Ppd [Tuberculin Purified Protein Derivative]     Always has positive testing to PPD, do not use   Hydromet [Hydrocodone Bit-Homatrop Mbr] Itching and Other (See Comments)    Severe stomach pain-face itches.    Sulfonamide Derivatives Rash    ROS: Pertinent items noted in HPI and remainder of comprehensive ROS otherwise negative.  HOME MEDS: Current Outpatient Medications  Medication Sig Dispense Refill   acetaminophen  (TYLENOL  8 HOUR) 650 MG CR tablet Take 1,300 mg by mouth 2 (two) times daily.     busPIRone  (BUSPAR ) 10 MG tablet Take 2 tablets (20 mg total) by mouth 2 (two) times daily. 360 tablet 0   cetirizine (ZYRTEC ALLERGY) 10 MG tablet Take 10 mg by mouth daily.     cholecalciferol (VITAMIN D3) 25 MCG (1000 UNIT) tablet Take 1,000 Units by mouth daily.     diltiazem  (CARDIZEM ) 30 MG tablet Take 1 tablet (  30 mg total) by mouth 2 (two) times daily. 60 tablet 5   dofetilide  (TIKOSYN ) 250 MCG capsule Take 1 capsule (250 mcg total) by mouth 2 (two) times daily. 60 capsule 3   ELIQUIS  5 MG TABS tablet TAKE ONE TABLET BY MOUTH TWICE DAILY 180 tablet 1   EPINEPHrine  0.3 mg/0.3 mL IJ SOAJ injection Inject 0.3 mg into the muscle as needed for anaphylaxis (then call 911/ go to ER). 2 each 3   ferrous sulfate  325 (65 FE) MG tablet Take 325 mg by mouth daily with breakfast.     furosemide  (LASIX ) 20 MG tablet TAKE 1 TABLET BY MOUTH ONCE DAILY (Patient taking differently: Take 20 mg by mouth daily as needed for fluid.) 90 tablet 3   levothyroxine  (SYNTHROID ) 50 MCG tablet TAKE ONE TABLET BY MOUTH DAILY BEFORE BREAKFAST 90 tablet 0   rosuvastatin  (CRESTOR ) 10 MG tablet Take 1 tablet (10 mg total) by mouth daily. 90 tablet 3   sertraline  (ZOLOFT ) 50 MG tablet Take 1 tablet (50 mg total) by mouth daily. 90 tablet 3   telmisartan  (MICARDIS ) 80 MG tablet Take 1 tablet  (80 mg total) by mouth daily. 90 tablet 3   tirzepatide  (ZEPBOUND ) 10 MG/0.5ML Pen Inject 10 mg into the skin once a week. (Patient taking differently: Inject 7.5 mg into the skin once a week.) 2 mL 0   tirzepatide  (ZEPBOUND ) 7.5 MG/0.5ML Pen Inject 7.5 mg into the skin once a week. 2 mL 0   triamcinolone  cream (KENALOG ) 0.1 % Apply 1 Application topically 2 (two) times daily as needed (itchy rash on back/ neck). 80 g 0   eszopiclone  (LUNESTA ) 1 MG TABS tablet Take 0.5-1 mg by mouth at bedtime as needed for sleep. Take immediately before bedtime (Patient not taking: Reported on 10/02/2023)     No current facility-administered medications for this visit.    LABS/IMAGING: No results found for this or any previous visit (from the past 48 hours). No results found.  VITALS: BP 118/79 (BP Location: Left Arm, Patient Position: Sitting)   Pulse 91   Ht 5' 1 (1.549 m)   Wt 275 lb (124.7 kg)   SpO2 94%   BMI 51.96 kg/m   EXAM: General appearance: alert, no distress and morbidly obese Neck: no carotid bruit, no JVD and thyroid  not enlarged, symmetric, no tenderness/mass/nodules Lungs: clear to auscultation bilaterally Heart: regular rate and rhythm, S1, S2 normal, no murmur, click, rub or gallop Abdomen: soft, non-tender; bowel sounds normal; no masses,  no organomegaly Extremities: No ankle edema, excess tissue and firmness around the upper calf and knee areas Pulses: 2+ and symmetric Skin: Skin color, texture, turgor normal. No rashes or lesions Neurologic: Grossly normal Psych: Pleasant  EKG: EKG Interpretation Date/Time:  Wednesday October 02 2023 09:37:24 EDT Ventricular Rate:  83 PR Interval:  192 QRS Duration:  144 QT Interval:  420 QTC Calculation: 493 R Axis:   4  Text Interpretation: Sinus rhythm with Premature atrial complexes Right bundle branch block T wave abnormality, consider inferolateral ischemia QTc 493 msec When compared with ECG of 23-Sep-2023 09:02, Premature  atrial complexes are now Present T wave inversion now evident in Lateral leads Confirmed by Dinah Franco [phone]) on 10/02/2023 9:40:42 AM   ASSESSMENT: Paroxysmal atrial fibrillation -now on tikosyn  250 mcg q12h Bilateral leg edema - minimal Morbid obesity with significant weight gain and prior bariatric surgery Essential hypertension RBBB Coronary artery calcification/aortic atherosclerosis Dyslipidemia, goal LDL less than 70  PLAN: 1.  Mrs. Gillin has transition to dofetilide  for A-fib control.  She is in sinus rhythm with PACs.  She had recently had a prolonged QTc that improved on lower dose Tikosyn  and although it is slightly longer today.  She has an upcoming follow-up with cardiac EP.  She is now on Zepbound  for weight loss.  Will have to monitor that closely if she loses significant weight we may be able to back off on some of her blood pressure medications.  She had been noted to have some coronary artery calcification on chest imaging but has no anginal symptoms.  She recently had lipids drawn that showed an LDL of 89 in March and was started on rosuvastatin  10 mg daily to target LDL to less than 70.  Follow-up in 6 months or sooner as necessary.  Hazle Lites, MD, [facility], FNLA, FACP  [place]  [facility]  Medical Director of the [facility] Lipid [facility] &  Cardiovascular Risk Reduction Clinic Diplomate of the American Board of Clinical Lipidology Attending Cardiologist  Direct Dial: [phone]  Fax: [phone]  Website:  [email or web address]  Hazle Lites 10/02/2023, 9:40 AM

## 2023-10-02 NOTE — Progress Notes (Unsigned)
 Electrophysiology Office Note:   Date:  10/14/2023  ID:  Rebekah [name], DOB 01/02/1956, MRN [id number]  Primary Cardiologist: Vinie JAYSON Maxcy, MD Primary Heart Failure: None Electrophysiologist: Will Gladis Norton, MD      History of Present Illness:   Rebekah [name] is a 68 y.o. female with h/o AF, HTN, HLD, OSA, Alpha Gal, hypothyroidism, CKD IIIa, obesity s/p sleeve gastrectomy seen today for routine electrophysiology follow up post Tikosyn  loading.   The patient was admitted 5/27-5/30/25 for Tikosyn  loading. QTc remained stable during admit.  Her Cardizem  dosing was reduced to 120 mg daily. She followed up in the AF clinic and she was felt to have had an increase in her QTc and dose was reduced to 250 mcg.    Since last being seen in our clinic the patient reports doing well on Tikosyn , no missed doses.  No issues on Eliquis .  Notes baseline hemorrhoids but no bleeding.     She denies chest pain, palpitations, dyspnea, PND, orthopnea, nausea, vomiting, dizziness, syncope, edema, weight gain, or early satiety.   Review of systems complete and found to be negative unless listed in HPI.   EP Information / Studies Reviewed:    EKG is ordered today. Personal review as below.  EKG Interpretation Date/Time:  Monday October 14 2023 08:29:47 EDT Ventricular Rate:  62 PR Interval:  162 QRS Duration:  148 QT Interval:  498 QTC Calculation: 505 R Axis:   17  Text Interpretation: Normal sinus rhythm Qtc 478 ms II / corrected Right bundle branch block Confirmed by Aniceto Jarvis ([id number]) on 10/14/2023 8:38:40 AM   Studies:  ECHO 10/08/22 > LVEF 65-70%   Arrhythmia / AAD AF  Tikosyn  > loading 08/2023   Risk Assessment/Calculations:    CHA2DS2-VASc Score = 3   This indicates a 3.2% annual risk of stroke. The patient's score is based upon: CHF History: 0 HTN History: 1 Diabetes History: 0 Stroke History: 0 Vascular Disease History: 0 Age Score: 1 Gender Score: 1              Physical Exam:   VS:  BP 120/70 (BP Location: Left Wrist)   Pulse 62   Ht 5' 1 (1.549 m)   Wt 275 lb 4.8 oz (124.9 kg)   SpO2 96%   BMI 52.02 kg/m    Wt Readings from Last 3 Encounters:  10/14/23 275 lb 4.8 oz (124.9 kg)  10/02/23 275 lb (124.7 kg)  09/20/23 284 lb 3.2 oz (128.9 kg)     GEN: pleasant, well nourished, well developed in no acute distress NECK: No JVD; No carotid bruits CARDIAC: Regular rate and rhythm, no murmurs, rubs, gallops RESPIRATORY:  Clear to auscultation without rales, wheezing or rhonchi  ABDOMEN: Soft, non-tender, non-distended EXTREMITIES:  No edema; No deformity   ASSESSMENT AND PLAN:    Paroxysmal Atrial Fibrillation  High Risk Medication Monitoring: Tikosyn   CHA2DS2-VASc 3  -EKG with NSR, QTc (corrected for RBBB) -Tikosyn  250 mcg BID  -update BMP, Mg+    -reviewed Tikosyn  instructions with patient / daughter -reduce Cardizem  to 30 mg Q4 PRN  Secondary Hypercoagulable State  -continue Eliquis  5mg  BID, dose reviewed and appropriate by age / wt   Hypertension  -well controlled on current regimen    OSA  -CPAP compliant    -follows with Dr. Shelah   Follow up with Afib Clinic or EP APP in 3 months (as previously scheduled)  Signed, Jarvis Aniceto, NP-C, AGACNP-BC [facility] -  Electrophysiology  10/14/2023, 8:38 AM

## 2023-10-02 NOTE — Patient Instructions (Signed)
 Medication Instructions:  Your physician recommends that you continue on your current medications as directed. Please refer to the Current Medication list given to you today.  *If you need a refill on your cardiac medications before your next appointment, please call your pharmacy*  Lab Work: NONE  If you have labs (blood work) drawn today and your tests are completely normal, you will receive your results only by: MyChart Message (if you have MyChart) OR A paper copy in the mail If you have any lab test that is abnormal or we need to change your treatment, we will call you to review the results.  Testing/Procedures: NONE  Follow-Up: At [facility], you and your health needs are our priority.  As part of our continuing mission to provide you with exceptional heart care, our providers are all part of one team.  This team includes your primary Cardiologist (physician) and Advanced Practice Providers or APPs (Physician Assistants and Nurse Practitioners) who all work together to provide you with the care you need, when you need it.  Your next appointment:   6 month(s)  Provider:   Hazle Lites, MD or One of our Advanced Practice Providers (APPs): Melita Springer, PA-C  Friddie Jetty, NP Evaline Hill, NP  Theotis Flake, PA-C Lawana Pray, NP  Willis Harter, PA-C Lovette Rud, PA-C  [place], PA-C Ernest Dick, NP  Marlana Silvan, NP Marcie Sever, PA-C  Laquita Plant, PA-C    Dayna Dunn, PA-C  Marlyse Single, PA-C Palmer Bobo, NP Katlyn West, NP Callie Goodrich, PA-C  Evan Williams, PA-C Sheng Haley, PA-C  Xika Zhao, NP Kathleen Johnson, PA-C

## 2023-10-07 ENCOUNTER — Other Ambulatory Visit: Payer: Self-pay | Admitting: Family Medicine

## 2023-10-07 DIAGNOSIS — F419 Anxiety disorder, unspecified: Secondary | ICD-10-CM

## 2023-10-14 ENCOUNTER — Ambulatory Visit: Attending: Pulmonary Disease | Admitting: Pulmonary Disease

## 2023-10-14 ENCOUNTER — Encounter: Payer: Self-pay | Admitting: Pulmonary Disease

## 2023-10-14 VITALS — BP 120/70 | HR 62 | Ht 61.0 in | Wt 275.3 lb

## 2023-10-14 DIAGNOSIS — I10 Essential (primary) hypertension: Secondary | ICD-10-CM

## 2023-10-14 DIAGNOSIS — G4733 Obstructive sleep apnea (adult) (pediatric): Secondary | ICD-10-CM

## 2023-10-14 DIAGNOSIS — Z5181 Encounter for therapeutic drug level monitoring: Secondary | ICD-10-CM

## 2023-10-14 DIAGNOSIS — Z79899 Other long term (current) drug therapy: Secondary | ICD-10-CM

## 2023-10-14 DIAGNOSIS — I480 Paroxysmal atrial fibrillation: Secondary | ICD-10-CM

## 2023-10-14 DIAGNOSIS — I4510 Unspecified right bundle-branch block: Secondary | ICD-10-CM | POA: Diagnosis not present

## 2023-10-14 DIAGNOSIS — D6869 Other thrombophilia: Secondary | ICD-10-CM

## 2023-10-14 MED ORDER — DILTIAZEM HCL 30 MG PO TABS: ORAL_TABLET | ORAL | 1 refills | Status: AC

## 2023-10-14 NOTE — Patient Instructions (Addendum)
 Medication Instructions:  Change diltiazem  to taking every 4 hours as needed for afib symptoms only *If you need a refill on your cardiac medications before your next appointment, please call your pharmacy*  Lab Work: BMET, MAG-TODAY If you have labs (blood work) drawn today and your tests are completely normal, you will receive your results only by: MyChart Message (if you have MyChart) OR A paper copy in the mail If you have any lab test that is abnormal or we need to change your treatment, we will call you to review the results.  Follow-Up: At [facility], you and your health needs are our priority.  As part of our continuing mission to provide you with exceptional heart care, our providers are all part of one team.  This team includes your primary Cardiologist (physician) and Advanced Practice Providers or APPs (Physician Assistants and Nurse Practitioners) who all work together to provide you with the care you need, when you need it.  Your next appointment:   As scheduled  Provider:   You will follow up in the Atrial Fibrillation Clinic located at [facility].

## 2023-10-15 ENCOUNTER — Ambulatory Visit: Payer: Self-pay | Admitting: Pulmonary Disease

## 2023-10-15 LAB — MAGNESIUM: Magnesium: 2.2 mg/dL (ref 1.6–2.3)

## 2023-10-15 LAB — BASIC METABOLIC PANEL WITH GFR
BUN/Creatinine Ratio: 15 (ref 12–28)
BUN: 16 mg/dL (ref 8–27)
CO2: 20 mmol/L (ref 20–29)
Calcium: 10.4 mg/dL — ABNORMAL HIGH (ref 8.7–10.3)
Chloride: 101 mmol/L (ref 96–106)
Creatinine, Ser: 1.05 mg/dL — ABNORMAL HIGH (ref 0.57–1.00)
Glucose: 85 mg/dL (ref 70–99)
Potassium: 4.6 mmol/L (ref 3.5–5.2)
Sodium: 139 mmol/L (ref 134–144)
eGFR: 58 mL/min/{1.73_m2} — ABNORMAL LOW (ref >59)

## 2023-10-21 ENCOUNTER — Ambulatory Visit: Admitting: Family Medicine

## 2023-10-29 ENCOUNTER — Other Ambulatory Visit: Payer: Self-pay | Admitting: Internal Medicine

## 2023-10-30 ENCOUNTER — Encounter: Payer: Self-pay | Admitting: Nurse Practitioner

## 2023-10-31 ENCOUNTER — Encounter: Payer: Self-pay | Admitting: Family Medicine

## 2023-10-31 DIAGNOSIS — Z6841 Body Mass Index (BMI) 40.0 and over, adult: Secondary | ICD-10-CM

## 2023-10-31 DIAGNOSIS — N1831 Chronic kidney disease, stage 3a: Secondary | ICD-10-CM

## 2023-10-31 DIAGNOSIS — G4733 Obstructive sleep apnea (adult) (pediatric): Secondary | ICD-10-CM

## 2023-11-01 MED ORDER — ZEPBOUND 12.5 MG/0.5ML ~~LOC~~ SOAJ: 12.5000 mg | SUBCUTANEOUS | 1 refills | Status: AC

## 2023-11-13 DIAGNOSIS — G4733 Obstructive sleep apnea (adult) (pediatric): Secondary | ICD-10-CM | POA: Diagnosis not present

## 2023-11-15 ENCOUNTER — Other Ambulatory Visit

## 2023-11-15 DIAGNOSIS — R809 Proteinuria, unspecified: Secondary | ICD-10-CM | POA: Diagnosis not present

## 2023-11-15 DIAGNOSIS — E211 Secondary hyperparathyroidism, not elsewhere classified: Secondary | ICD-10-CM | POA: Diagnosis not present

## 2023-11-15 DIAGNOSIS — D631 Anemia in chronic kidney disease: Secondary | ICD-10-CM | POA: Diagnosis not present

## 2023-11-15 DIAGNOSIS — N189 Chronic kidney disease, unspecified: Secondary | ICD-10-CM | POA: Diagnosis not present

## 2023-11-18 ENCOUNTER — Encounter: Payer: Self-pay | Admitting: Family Medicine

## 2023-11-18 ENCOUNTER — Other Ambulatory Visit: Payer: Self-pay

## 2023-11-18 DIAGNOSIS — E034 Atrophy of thyroid (acquired): Secondary | ICD-10-CM

## 2023-11-18 MED ORDER — LEVOTHYROXINE SODIUM 50 MCG PO TABS: ORAL_TABLET | ORAL | 0 refills | Status: DC

## 2023-11-19 DIAGNOSIS — G4733 Obstructive sleep apnea (adult) (pediatric): Secondary | ICD-10-CM | POA: Diagnosis not present

## 2023-11-20 DIAGNOSIS — G4733 Obstructive sleep apnea (adult) (pediatric): Secondary | ICD-10-CM | POA: Diagnosis not present

## 2023-11-20 DIAGNOSIS — I5032 Chronic diastolic (congestive) heart failure: Secondary | ICD-10-CM | POA: Diagnosis not present

## 2023-11-20 DIAGNOSIS — I129 Hypertensive chronic kidney disease with stage 1 through stage 4 chronic kidney disease, or unspecified chronic kidney disease: Secondary | ICD-10-CM | POA: Diagnosis not present

## 2023-11-20 DIAGNOSIS — N1831 Chronic kidney disease, stage 3a: Secondary | ICD-10-CM | POA: Diagnosis not present

## 2023-11-26 ENCOUNTER — Ambulatory Visit: Admitting: Family Medicine

## 2023-11-29 ENCOUNTER — Encounter: Payer: Self-pay | Admitting: Family Medicine

## 2023-11-29 ENCOUNTER — Ambulatory Visit: Admitting: Family Medicine

## 2023-11-29 VITALS — BP 121/81 | HR 74 | Temp 97.8°F | Ht 61.0 in | Wt 271.1 lb

## 2023-11-29 DIAGNOSIS — F32A Depression, unspecified: Secondary | ICD-10-CM

## 2023-11-29 DIAGNOSIS — Z6841 Body Mass Index (BMI) 40.0 and over, adult: Secondary | ICD-10-CM | POA: Diagnosis not present

## 2023-11-29 DIAGNOSIS — N1831 Chronic kidney disease, stage 3a: Secondary | ICD-10-CM | POA: Diagnosis not present

## 2023-11-29 DIAGNOSIS — E034 Atrophy of thyroid (acquired): Secondary | ICD-10-CM | POA: Diagnosis not present

## 2023-11-29 DIAGNOSIS — G4733 Obstructive sleep apnea (adult) (pediatric): Secondary | ICD-10-CM

## 2023-11-29 DIAGNOSIS — F419 Anxiety disorder, unspecified: Secondary | ICD-10-CM

## 2023-11-29 DIAGNOSIS — E611 Iron deficiency: Secondary | ICD-10-CM

## 2023-11-29 MED ORDER — BUSPIRONE HCL 10 MG PO TABS: 20.0000 mg | ORAL_TABLET | Freq: Two times a day (BID) | ORAL | 3 refills | Status: AC

## 2023-11-29 MED ORDER — ZEPBOUND 15 MG/0.5ML ~~LOC~~ SOAJ: 15.0000 mg | SUBCUTANEOUS | 3 refills | Status: AC

## 2023-11-29 MED ORDER — LEVOTHYROXINE SODIUM 50 MCG PO TABS: ORAL_TABLET | ORAL | 3 refills | Status: AC

## 2023-11-29 MED ORDER — SERTRALINE HCL 50 MG PO TABS: 50.0000 mg | ORAL_TABLET | Freq: Every day | ORAL | 3 refills | Status: AC

## 2023-11-29 NOTE — Progress Notes (Signed)
 Subjective: CC: Morbid obesity PCP: Jolinda Norene HERO, DO YEP:Rebekah [name] is a 68 y.o. female presenting to clinic today for:  1.  Morbid obesity associated with moderate obstructive sleep apnea and history of gastric bypass surgery Patient reports that she has been doing fairly okay on the Zepbound .  Finally, she is at the 12.5 mg weekly and this has decreased her p.o. intake and suppressed appetite.  She has not noticed a huge difference in the food noise but seems to be doing better on this strength compared to the others.  She had some nausea initially but has not reported any ongoing issues with this.  No reports of uncontrolled GERD.  She continues to have some loose stools but this has been the case ever since she had her gallbladder out  2.  Atrial fibrillation Under the care of Dr. Inocencio and Dr. Mona.  She was recently taken off of daily Cardizem  and only given this for as needed use.  She is now titrated onto Tikosyn  but that actually had to be reduced due to QTc prolongation on the higher dose.  She still has some abnormal sensations of arrhythmia but has not seen overt atrial fibrillation on any of her testing.  Blood pressures are fluctuating just a little bit where systolics will go as far as 90 and as high as 120s.  Utilizing a wrist monitor.  Has smart watch at home but can do EKGs as well  3.  CKD 3A with iron deficiency Recently saw nephrology and renal function stable.  Iron deficiency noted but no drop in hemoglobin.  Has been on ferrous sulfate  325 daily since March.  Has not had iron rechecked.   ROS: Per HPI  Allergies  Allergen Reactions   Beef-Derived Drug Products Anaphylaxis    After tick bite, cannot eat beef, pork or lamb   Lambs Quarters Anaphylaxis    After tick bite cannot eat beef, pork or lamb   Mucinex [Guaifenesin Er] Anaphylaxis   Pork-Derived Products Anaphylaxis    After tick bite cannot eat beef, pork or lamb   Darvon [Propoxyphene] Other  (See Comments)    Hallucinations    Ace Inhibitors Swelling and Cough    Pedal Edema   Hydrocodone Itching   Ppd [Tuberculin Purified Protein Derivative]     Always has positive testing to PPD, do not use   Hydromet [Hydrocodone Bit-Homatrop Mbr] Itching and Other (See Comments)    Severe stomach pain-face itches.    Sulfonamide Derivatives Rash   Past Medical History:  Diagnosis Date   Allergy    Multiple   Anxiety    Arthritis    Atrial fibrillation with rapid ventricular response (HCC) 12/26/2021   Atrial fibrillation with RVR (HCC) 12/27/2021   Bilateral bunions    Cataract    Both eyes- lenses replaced   Chronic kidney disease 2022   Not kidney disease but kidney stone   Complication of anesthesia    Depression    More anxiety-worry-stress   Difficulty sleeping    prior sleep study did not reveal sleep apnea per patient   DJD (degenerative joint disease)    Dyspnea    Dysrhythmia    a-fib   Environmental allergies    Food allergy    Gallbladder problem    GERD (gastroesophageal reflux disease)    Heart murmur    History of kidney stones    Hypertension    no meds after weight loss   Hypothyroidism  Incontinence of urine    at nite    Joint pain    Left atrial dilatation    Left foot pain    Leg edema    Obesity    s/p gastric sleeve 12/2013 (previously weighed close to 400 lbs)   Obstructive sleep apnea 12/07/2022   Osteoarthritis    Oxygen deficiency    3L/min for sleep due to slowed reaperations during sleep   Palpitations    Paroxysmal atrial fibrillation (HCC)    Pneumonia    hx of    PONV (postoperative nausea and vomiting)    Status post bilateral knee replacements    Supplemental oxygen dependent    uses 2l/[place] at night, states HR goes low and O2 drops   Tuberculosis    had 6 month of INH due to exposure    Vaginal vault prolapse     Current Outpatient Medications:    acetaminophen  (TYLENOL  8 HOUR) 650 MG CR tablet, Take 1,300 mg by  mouth 2 (two) times daily., Disp: , Rfl:    busPIRone  (BUSPAR ) 10 MG tablet, Take 2 tablets (20 mg total) by mouth 2 (two) times daily., Disp: 360 tablet, Rfl: 0   cetirizine (ZYRTEC ALLERGY) 10 MG tablet, Take 10 mg by mouth daily., Disp: , Rfl:    cholecalciferol (VITAMIN D3) 25 MCG (1000 UNIT) tablet, Take 1,000 Units by mouth daily., Disp: , Rfl:    diltiazem  (CARDIZEM ) 30 MG tablet, Take one tablet by mouth every 4 hours as needed for afib symptoms only, Disp: 90 tablet, Rfl: 1   dofetilide  (TIKOSYN ) 250 MCG capsule, Take 1 capsule (250 mcg total) by mouth 2 (two) times daily., Disp: 60 capsule, Rfl: 3   ELIQUIS  5 MG TABS tablet, TAKE ONE TABLET BY MOUTH TWICE DAILY, Disp: 180 tablet, Rfl: 1   EPINEPHrine  0.3 mg/0.3 mL IJ SOAJ injection, Inject 0.3 mg into the muscle as needed for anaphylaxis (then call 911/ go to ER)., Disp: 2 each, Rfl: 3   eszopiclone  (LUNESTA ) 1 MG TABS tablet, Take 0.5-1 mg by mouth at bedtime as needed for sleep. Take immediately before bedtime, Disp: , Rfl:    ferrous sulfate  325 (65 FE) MG tablet, Take 325 mg by mouth daily with breakfast., Disp: , Rfl:    furosemide  (LASIX ) 20 MG tablet, TAKE 1 TABLET BY MOUTH ONCE DAILY, Disp: 90 tablet, Rfl: 3   levothyroxine  (SYNTHROID ) 50 MCG tablet, TAKE ONE TABLET BY MOUTH DAILY BEFORE BREAKFAST, Disp: 11 tablet, Rfl: 0   rosuvastatin  (CRESTOR ) 10 MG tablet, Take 1 tablet (10 mg total) by mouth daily., Disp: 90 tablet, Rfl: 3   sertraline  (ZOLOFT ) 50 MG tablet, Take 1 tablet (50 mg total) by mouth daily., Disp: 90 tablet, Rfl: 0   telmisartan  (MICARDIS ) 80 MG tablet, Take 1 tablet (80 mg total) by mouth daily., Disp: 90 tablet, Rfl: 3   tirzepatide  (ZEPBOUND ) 12.5 MG/0.5ML Pen, Inject 12.5 mg into the skin once a week., Disp: 6 mL, Rfl: 1   triamcinolone  cream (KENALOG ) 0.1 %, Apply 1 Application topically 2 (two) times daily as needed (itchy rash on back/ neck)., Disp: 80 g, Rfl: 0 Social History   Socioeconomic History    Marital status: Married    Spouse name: Kellee Sittner   Number of children: 2   Years of education: 16   Highest education level: Bachelor's degree (e.g., BA, AB, BS)  Occupational History   Occupation: Merchant navy officer: OTHER    Comment: [employer]   Occupation: retired Engineer, civil (consulting) - L&D @ [employer]  Tobacco Use   Smoking status: Never    Passive exposure: Never   Smokeless tobacco: Never   Tobacco comments:    Tried 1-2 cigarettes as a child. Nothing else.  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   Social Drivers of Health   Financial Resource Strain: Low Risk  (11/22/2023)   Overall Financial Resource Strain (CARDIA)    Difficulty of Paying Living Expenses: Not very hard  Food Insecurity: No Food Insecurity (11/22/2023)   Hunger Vital Sign    Worried About Running Out of Food in the Last Year: Never true    Ran Out of Food in the Last Year: Never true  Transportation Needs: No Transportation Needs (11/22/2023)   PRAPARE - Administrator, Civil Service (Medical): No    Lack of Transportation (Non-Medical): No  Physical Activity: Inactive (11/22/2023)   Exercise Vital Sign    Days of Exercise per Week: 0 days    Minutes of Exercise per Session: Not on file  Stress: Stress Concern Present (11/22/2023)   [employer] of Occupational Health - Occupational Stress Questionnaire    Feeling of Stress: To some extent  Social Connections: Moderately Integrated (11/22/2023)   Social Connection and Isolation Panel    Frequency of Communication with Friends and Family: More than three times a week     Frequency of Social Gatherings with Friends and Family: More than three times a week    Attends Religious Services: 1 to 4 times per year    Active Member of [employer] or Organizations: No    Attends Engineer, structural: Not on file    Marital Status: Married  Catering manager Violence: Not At Risk (09/16/2023)   Humiliation, Afraid, Rape, and Kick questionnaire    Fear of Current or Ex-Partner: No    Emotionally Abused: No    Physically Abused: No    Sexually Abused: No   Family History  Problem Relation Age of Onset   Diabetes Father    Hyperlipidemia Father    Hypertension Father    Heart disease Father    Sudden death Father    Anxiety disorder Father    Obesity Father    Varicose Veins Father    Uterine cancer Mother    Cervical cancer Mother    Breast cancer Mother    Cancer Mother        cervical and brreast cancer   Hypertension Mother    Hyperlipidemia Mother    Obesity Mother    Arthritis Mother    Diabetes Maternal Grandmother    Obesity Maternal Grandmother    Diabetes Brother    Hypertension Brother    Stroke Brother    Hypertension Brother    Obesity Daughter     Objective: Office vital signs reviewed. BP 121/81   Pulse 74   Temp 97.8 F (36.6 C)   Ht 5' 1 (1.549 m)   Wt 271 lb 2 oz (123 kg)   SpO2 93%   BMI 51.23  kg/m   Physical Examination:  General: Awake, alert, well nourished, No acute distress HEENT: sclera white, MMM.  No exophthalmos Cardio: regular rate and rhythm, S1S2 heard, no murmurs appreciated Pulm: clear to auscultation bilaterally, no wheezes, rhonchi or rales; normal work of breathing on room air     11/29/2023    1:06 PM 09/16/2023   11:20 AM 07/15/2023    9:24 AM 02/05/2023    9:11 AM 01/15/2023    9:29 AM  Depression screen PHQ 2/9  Decreased Interest 0 0 0 0 0  Down, Depressed, Hopeless 0 0 0 0 0  PHQ - 2 Score 0 0 0 0 0  Altered sleeping 2  1 3 3   Tired, decreased energy 2  1 1 1   Change in appetite 0  1 1 1    Feeling bad or failure about yourself  0  0 0 0  Trouble concentrating 0  0 0 0  Moving slowly or fidgety/restless 0  0 0 0  Suicidal thoughts 0  0 0 0  PHQ-9 Score 4  3 5 5   Difficult doing work/chores Somewhat difficult  Not difficult at all Not difficult at all Not difficult at all      11/29/2023    1:07 PM 09/16/2023   11:20 AM 07/15/2023    9:24 AM 02/05/2023    9:11 AM  GAD 7 : Generalized Anxiety Score  Nervous, Anxious, on Edge 0 0 0 0  Control/stop worrying 0 0 0 0  Worry too much - different things 0 0 0 0  Trouble relaxing 0 0 0 0  Restless 0 0 0 0  Easily annoyed or irritable 1 0 0 0  Afraid - awful might happen 0 0 0 0  Total GAD 7 Score 1 0 0 0  Anxiety Difficulty Not difficult at all Not difficult at all Not difficult at all Not difficult at all     Assessment/ Plan: 68 y.o. female   Hypothyroidism due to acquired atrophy of thyroid  - Plan: TSH + free T4, levothyroxine  (SYNTHROID ) 50 MCG tablet  BMI 50.0-59.9, adult (HCC) - Plan: tirzepatide  (ZEPBOUND ) 15 MG/0.5ML Pen  Moderate obstructive sleep apnea - Plan: tirzepatide  (ZEPBOUND ) 15 MG/0.5ML Pen  CKD stage 3a, GFR 45-59 ml/min (HCC) - Plan: tirzepatide  (ZEPBOUND ) 15 MG/0.5ML Pen  Iron deficiency - Plan: Iron, TIBC and Ferritin Panel  Anxiety and depression - Plan: sertraline  (ZOLOFT ) 50 MG tablet  Medications have been renewed.  Will check her thyroid  level given reports of change in Synthroid  manufacturer.  I did question if some of the arrhythmia and/or diarrhea may be coming from her new thyroid  medication  I will advance her Zepbound  to 15 mg as tolerated.  She should still have several months of the 12.5 mg should she want to stay on that for a little bit longer.  She is having appropriate reduction in weight on this medication going down almost 20 pounds since initiation  Her renal function has been nice and stable at CKD 3A.  Labcor labs were reviewed and iron deficiency is noted so we will recheck  her iron levels as these have not been done since March and she has been consistent on iron meds.  I do question if in the setting of gastric bypass surgery she is not absorbing adequately.  If not, plan for referral to hematology/oncology for consideration of IV iron  Mood stable.  Meds renewed   Norene CHRISTELLA Fielding, DO [facility] [phone]

## 2023-11-30 ENCOUNTER — Encounter: Payer: Self-pay | Admitting: Family Medicine

## 2023-11-30 LAB — IRON,TIBC AND FERRITIN PANEL
Ferritin: 87 ng/mL (ref 15–150)
Iron Saturation: 15 % (ref 15–55)
Iron: 53 ug/dL (ref 27–139)
Total Iron Binding Capacity: 347 ug/dL (ref 250–450)
UIBC: 294 ug/dL (ref 118–369)

## 2023-11-30 LAB — TSH+FREE T4
Free T4: 1.13 ng/dL (ref 0.82–1.77)
TSH: 1.68 u[IU]/mL (ref 0.450–4.500)

## 2023-12-02 ENCOUNTER — Ambulatory Visit: Payer: Self-pay | Admitting: Family Medicine

## 2023-12-09 ENCOUNTER — Other Ambulatory Visit: Payer: Self-pay | Admitting: Internal Medicine

## 2023-12-09 DIAGNOSIS — I480 Paroxysmal atrial fibrillation: Secondary | ICD-10-CM

## 2023-12-09 NOTE — Telephone Encounter (Signed)
 Prescription refill request for Eliquis  received. Indication: PAF Last office visit: 10/14/23  KATHEE Barrack NP Scr: 1.05 on 10/14/23  Epic Age: 68 Weight: 124.9kg  Based on above findings Eliquis  5mg  twice daily is the appropriate dose.  Refill approved.

## 2023-12-14 DIAGNOSIS — G4733 Obstructive sleep apnea (adult) (pediatric): Secondary | ICD-10-CM | POA: Diagnosis not present

## 2023-12-18 DIAGNOSIS — Z6841 Body Mass Index (BMI) 40.0 and over, adult: Secondary | ICD-10-CM | POA: Diagnosis not present

## 2023-12-18 DIAGNOSIS — M154 Erosive (osteo)arthritis: Secondary | ICD-10-CM | POA: Diagnosis not present

## 2023-12-18 DIAGNOSIS — M5136 Other intervertebral disc degeneration, lumbar region with discogenic back pain only: Secondary | ICD-10-CM | POA: Diagnosis not present

## 2023-12-18 DIAGNOSIS — M064 Inflammatory polyarthropathy: Secondary | ICD-10-CM | POA: Diagnosis not present

## 2023-12-24 ENCOUNTER — Ambulatory Visit: Admitting: Nurse Practitioner

## 2023-12-24 ENCOUNTER — Ambulatory Visit (HOSPITAL_COMMUNITY)
Admission: RE | Admit: 2023-12-24 | Discharge: 2023-12-24 | Disposition: A | Discharge status: Home / Self Care | Source: Ambulatory Visit | Attending: Internal Medicine | Admitting: Internal Medicine

## 2023-12-24 ENCOUNTER — Encounter (HOSPITAL_COMMUNITY): Payer: Self-pay | Admitting: Internal Medicine

## 2023-12-24 VITALS — BP 116/88 | HR 71 | Ht 61.0 in | Wt 273.1 lb

## 2023-12-24 DIAGNOSIS — Z79899 Other long term (current) drug therapy: Secondary | ICD-10-CM

## 2023-12-24 DIAGNOSIS — D6869 Other thrombophilia: Secondary | ICD-10-CM | POA: Diagnosis not present

## 2023-12-24 DIAGNOSIS — Z5181 Encounter for therapeutic drug level monitoring: Secondary | ICD-10-CM | POA: Diagnosis not present

## 2023-12-24 DIAGNOSIS — I480 Paroxysmal atrial fibrillation: Secondary | ICD-10-CM

## 2023-12-24 MED ORDER — DOFETILIDE 250 MCG PO CAPS: 250.0000 ug | ORAL_CAPSULE | Freq: Two times a day (BID) | ORAL | 1 refills | Status: AC

## 2023-12-24 NOTE — Progress Notes (Signed)
 Primary Care Physician: Jolinda Norene HERO, DO Primary Cardiologist: Vinie JAYSON Maxcy, MD Electrophysiologist: Will Gladis Norton, MD  Referring Physician: Dr Francyne    Rebekah [name] is a 68 y.o. female with a history of CKD, HTN, OSA, atrial fibrillation who presents for follow up in the [facility]. Patient is on Eliquis  for stroke prevention. She has been maintained on flecainide  for rhythm control.  Patient returns for follow up for atrial fibrillation and flecainide  monitoring. She reports that she was in afib for about 2 weeks in the setting of an acute GI illness. Otherwise, she has been maintaining SR the majority of the time. No bleeding issues on anticoagulation.   On follow up 09/10/23, patient is here for Tikosyn  admission. She stopped flecainide  3 days prior to today's admission. No new medications since last OV. No benadryl  use. No missed doses of anticoagulant.   On follow up 09/20/23, patient is here for Tikosyn  surveillance. S/p Tikosyn  admission 5/27-30/2025. Patient did not require cardioversion. Discharged on 500 mcg BID. Diltiazem  reduced to 120 mg daily.   On follow up 12/24/23, patient is here for Tikosyn  surveillance. She has had overall very low Afib burden since last office visit. She has shown a brief episode of Afib via Kardiamobile strip that actually shows conversion to NSR within 30 seconds. No missed doses of Tikosyn  250 mcg BID. Labs from 8/1 show creatinine of 1.23 and potassium 3.8.   Today, she  denies symptoms of palpitations, chest pain, shortness of breath, orthopnea, PND, lower extremity edema, dizziness, presyncope, syncope, bleeding, or neurologic sequela. The patient is tolerating medications without difficulties and is otherwise without complaint today.    Atrial Fibrillation Risk Factors:  she does have symptoms or diagnosis of sleep apnea. she does not have a history of rheumatic fever.   Atrial Fibrillation  Management history:  Previous antiarrhythmic drugs: flecainide , tikosyn  Previous cardioversions: none Previous ablations: none Anticoagulation history: Eliquis   ROS- All systems are reviewed and negative except as per the HPI above.  Past Medical History:  Diagnosis Date   Allergy    Multiple   Anxiety    Arthritis    Atrial fibrillation with rapid ventricular response (HCC) 12/26/2021   Atrial fibrillation with RVR (HCC) 12/27/2021   Bilateral bunions    Cataract    Both eyes- lenses replaced   Chronic kidney disease 2022   Not kidney disease but kidney stone   Complication of anesthesia    Depression    More anxiety-worry-stress   Difficulty sleeping    prior sleep study did not reveal sleep apnea per patient   DJD (degenerative joint disease)    Dyspnea    Dysrhythmia    a-fib   Environmental allergies    Food allergy    Gallbladder problem    GERD (gastroesophageal reflux disease)    Heart murmur    History of kidney stones    Hypertension    no meds after weight loss   Hypothyroidism    Incontinence of urine    at nite    Iron deficiency anemia    Joint pain    Left atrial dilatation    Left foot pain    Leg edema    Obesity    s/p gastric sleeve 12/2013 (previously weighed close to 400 lbs)   Obstructive sleep apnea 12/07/2022   Osteoarthritis    Oxygen deficiency    3L/min for sleep due to slowed reaperations during sleep  Palpitations    Paroxysmal atrial fibrillation (HCC)    Pneumonia    hx of    PONV (postoperative nausea and vomiting)    Status post bilateral knee replacements    Supplemental oxygen dependent    uses 2l/[place] at night, states HR goes low and O2 drops   Tuberculosis    had 6 month of INH due to exposure    Vaginal vault prolapse     Current Outpatient Medications  Medication Sig Dispense Refill   busPIRone  (BUSPAR ) 10 MG tablet Take 2 tablets (20 mg total) by mouth 2 (two) times daily. 360 tablet 3   cetirizine (ZYRTEC  ALLERGY) 10 MG tablet Take 10 mg by mouth daily.     cholecalciferol (VITAMIN D3) 25 MCG (1000 UNIT) tablet Take 1,000 Units by mouth daily.     diltiazem  (CARDIZEM ) 30 MG tablet Take one tablet by mouth every 4 hours as needed for afib symptoms only 90 tablet 1   ELIQUIS  5 MG TABS tablet TAKE ONE TABLET BY MOUTH TWICE DAILY 180 tablet 1   EPINEPHrine  0.3 mg/0.3 mL IJ SOAJ injection Inject 0.3 mg into the muscle as needed for anaphylaxis (then call 911/ go to ER). 2 each 3   eszopiclone  (LUNESTA ) 1 MG TABS tablet Take 0.5-1 mg by mouth at bedtime as needed for sleep. Take immediately before bedtime     ferrous sulfate  325 (65 FE) MG tablet Take 325 mg by mouth daily with breakfast.     furosemide  (LASIX ) 20 MG tablet TAKE 1 TABLET BY MOUTH ONCE DAILY 90 tablet 3   levothyroxine  (SYNTHROID ) 50 MCG tablet TAKE ONE TABLET BY MOUTH DAILY BEFORE BREAKFAST 90 tablet 3   rosuvastatin  (CRESTOR ) 10 MG tablet Take 1 tablet (10 mg total) by mouth daily. 90 tablet 3   sertraline  (ZOLOFT ) 50 MG tablet Take 1 tablet (50 mg total) by mouth daily. 90 tablet 3   telmisartan  (MICARDIS ) 80 MG tablet Take 1 tablet (80 mg total) by mouth daily. 90 tablet 3   tirzepatide  (ZEPBOUND ) 12.5 MG/0.5ML Pen Inject 12.5 mg into the skin once a week. 6 mL 1   tirzepatide  (ZEPBOUND ) 15 MG/0.5ML Pen Inject 15 mg into the skin once a week. 6 mL 3   triamcinolone  cream (KENALOG ) 0.1 % Apply 1 Application topically 2 (two) times daily as needed (itchy rash on back/ neck). 80 g 0   dofetilide  (TIKOSYN ) 250 MCG capsule Take 1 capsule (250 mcg total) by mouth 2 (two) times daily. 180 capsule 1   No current facility-administered medications for this encounter.    Physical Exam: BP 116/88   Pulse 71   Ht 5' 1 (1.549 m)   Wt 123.9 kg   BMI 51.60 kg/m   GEN- The patient is well appearing, alert and oriented x 3 today.   Neck - no JVD or carotid bruit noted Lungs- Clear to ausculation bilaterally, normal work of breathing Heart-  Regular rate and rhythm, no murmurs, rubs or gallops, PMI not laterally displaced Extremities- no clubbing, cyanosis, or edema Skin - no rash or ecchymosis noted   Wt Readings from Last 3 Encounters:  12/24/23 123.9 kg  11/29/23 123 kg  10/14/23 124.9 kg     EKG today demonstrates  Vent. rate 71 BPM PR interval 142 ms QRS duration 88 ms QT/QTcB 412/447 ms P-R-T axes 57 45 32 Normal sinus rhythm Nonspecific T wave abnormality Abnormal ECG When compared with ECG of 14-Oct-2023 08:29, RBBB is no longer present  Echo 10/08/22 demonstrated   1. Left ventricular ejection fraction, by estimation, is 65 to 70%. The  left ventricle has normal function. The left ventricle has no regional  wall motion abnormalities. There is mild left ventricular hypertrophy.  Left ventricular diastolic parameters were normal.   2. Right ventricular systolic function is normal. The right ventricular  size is normal. There is normal pulmonary artery systolic pressure. The  estimated right ventricular systolic pressure is 32.6 mmHg.   3. The mitral valve is normal in structure. Trivial mitral valve  regurgitation. No evidence of mitral stenosis.   4. The aortic valve was not well visualized. Aortic valve regurgitation  is trivial. No aortic stenosis is present.   5. Aortic dilatation noted. There is dilatation of the ascending aorta,  measuring 42 mm.   6. The inferior vena cava is normal in size with greater than 50%  respiratory variability, suggesting right atrial pressure of 3 mmHg.    CHA2DS2-VASc Score = 3  The patient's score is based upon: CHF History: 0 HTN History: 1 Diabetes History: 0 Stroke History: 0 Vascular Disease History: 0 Age Score: 1 Gender Score: 1       ASSESSMENT AND PLAN: Paroxysmal Atrial Fibrillation (ICD10:  I48.0) The patient's CHA2DS2-VASc score is 3, indicating a 3.2% annual risk of stroke.   Flecainide  discontinued due to QRS widening. S/p Tikosyn  admission  5/27-30/2025.  Patient is currently in NSR. Overall happy with current management.    High risk medication monitoring (ICD10: J342684) Patient requires ongoing monitoring for anti-arrhythmic medication which has the potential to cause life threatening arrhythmias or AV block. Qtc stable. Continue Tikosyn  250 mcg BID. Bmet and mag drawn today.  Secondary Hypercoagulable State (ICD10:  D68.69) The patient is at significant risk for stroke/thromboembolism based upon her CHA2DS2-VASc Score of 3.  Continue Apixaban  (Eliquis ).  Continue Eliquis .   HTN Stable today.  Obesity Body mass index is 51.6 kg/m.  Encouraged daily walking.   OSA  Encouraged nightly CPAP Followed by Dr Shelah   Follow up in 3 months for Tikosyn  surveillance.      Dorn Heinrich, PA-C Af[facility] [address] [place], [place] [address] [phone]

## 2023-12-25 ENCOUNTER — Ambulatory Visit (HOSPITAL_COMMUNITY): Payer: Self-pay | Admitting: Internal Medicine

## 2023-12-25 LAB — BASIC METABOLIC PANEL WITH GFR
BUN/Creatinine Ratio: 13 (ref 12–28)
BUN: 14 mg/dL (ref 8–27)
CO2: 24 mmol/L (ref 20–29)
Calcium: 9.9 mg/dL (ref 8.7–10.3)
Chloride: 98 mmol/L (ref 96–106)
Creatinine, Ser: 1.12 mg/dL — ABNORMAL HIGH (ref 0.57–1.00)
Glucose: 87 mg/dL (ref 70–99)
Potassium: 4.2 mmol/L (ref 3.5–5.2)
Sodium: 140 mmol/L (ref 134–144)
eGFR: 53 mL/min/1.73 — ABNORMAL LOW (ref >59)

## 2023-12-25 LAB — MAGNESIUM: Magnesium: 2.1 mg/dL (ref 1.6–2.3)

## 2023-12-30 DIAGNOSIS — G4733 Obstructive sleep apnea (adult) (pediatric): Secondary | ICD-10-CM | POA: Diagnosis not present

## 2023-12-30 DIAGNOSIS — I5032 Chronic diastolic (congestive) heart failure: Secondary | ICD-10-CM | POA: Diagnosis not present

## 2023-12-30 DIAGNOSIS — I129 Hypertensive chronic kidney disease with stage 1 through stage 4 chronic kidney disease, or unspecified chronic kidney disease: Secondary | ICD-10-CM | POA: Diagnosis not present

## 2023-12-30 DIAGNOSIS — N1831 Chronic kidney disease, stage 3a: Secondary | ICD-10-CM | POA: Diagnosis not present

## 2023-12-31 ENCOUNTER — Telehealth: Admitting: Family Medicine

## 2023-12-31 DIAGNOSIS — J208 Acute bronchitis due to other specified organisms: Secondary | ICD-10-CM

## 2023-12-31 DIAGNOSIS — B9689 Other specified bacterial agents as the cause of diseases classified elsewhere: Secondary | ICD-10-CM | POA: Diagnosis not present

## 2023-12-31 MED ORDER — LEVALBUTEROL TARTRATE 45 MCG/ACT IN AERO: 1.0000 | INHALATION_SPRAY | RESPIRATORY_TRACT | 12 refills | Status: AC | PRN

## 2023-12-31 MED ORDER — DOXYCYCLINE HYCLATE 100 MG PO TABS: 100.0000 mg | ORAL_TABLET | Freq: Two times a day (BID) | ORAL | 0 refills | Status: AC

## 2023-12-31 MED ORDER — BENZONATATE 100 MG PO CAPS: 100.0000 mg | ORAL_CAPSULE | Freq: Three times a day (TID) | ORAL | 0 refills | Status: AC | PRN

## 2023-12-31 NOTE — Progress Notes (Signed)
 Virtual Visit Consent   Rebekah [name], you are scheduled for a virtual visit with a [facility] provider today. Just as with appointments in the office, your consent must be obtained to participate. Your consent will be active for this visit and any virtual visit you may have with one of our providers in the next 365 days. If you have a MyChart account, a copy of this consent can be sent to you electronically.  As this is a virtual visit, video technology does not allow for your provider to perform a traditional examination. This may limit your provider's ability to fully assess your condition. If your provider identifies any concerns that need to be evaluated in person or the need to arrange testing (such as labs, EKG, etc.), we will make arrangements to do so. Although advances in technology are sophisticated, we cannot ensure that it will always work on either your end or our end. If the connection with a video visit is poor, the visit may have to be switched to a telephone visit. With either a video or telephone visit, we are not always able to ensure that we have a secure connection.  By engaging in this virtual visit, you consent to the provision of healthcare and authorize for your insurance to be billed (if applicable) for the services provided during this visit. Depending on your insurance coverage, you may receive a charge related to this service.  I need to obtain your verbal consent now. Are you willing to proceed with your visit today? Rebekah [name] has provided verbal consent on 12/31/2023 for a virtual visit (video or telephone). Rebekah CHRISTELLA Barefoot, NP  Date: 12/31/2023 8:12 AM   Virtual Visit via Video Note   I, Rebekah [name], connected with  Rebekah [name]  ([id number], 16-Jun-1955) on 12/31/23 at  8:30 AM EDT by a video-enabled telemedicine application and verified that I am speaking with the correct person using two identifiers.  Location: Patient: Virtual Visit Location Patient:  Home Provider: Virtual Visit Location Provider: Home Office   I discussed the limitations of evaluation and management by telemedicine and the availability of in person appointments. The patient expressed understanding and agreed to proceed.    History of Present Illness: Rebekah [name] is a 68 y.o. who identifies as a female who was assigned female at birth, and is being seen today for sinus congestion.   Onset was last week- 5 days ago. Started with what seemed like allergies (uses zyrtec). Runny nose, sneezing, developed thick post nasal drainage.  Associated symptoms are laryngitis, wheezing at times, cough with green mucus, scratchy throat Modifying factors are Coricidin hbp cold & flu, saline nasal spray, honey & lemon drops Denies chest pain, shortness of breath, fevers, chills Has history of developing bronchitis.   Exposure to sick contacts- unknown COVID test: is going to be delivered today    Problems:  Patient Active Problem List   Diagnosis Date Noted   Paroxysmal atrial fibrillation (HCC) 09/10/2023   Hypothyroidism due to acquired atrophy of thyroid  07/16/2023   CKD stage 3a, GFR 45-59 ml/min (HCC) 07/15/2023   Insomnia 02/27/2023   Lung nodules 02/27/2023   Hypercoagulable state due to paroxysmal atrial fibrillation (HCC) 01/14/2023   Obstructive sleep apnea 12/07/2022   Abnormal CT of the chest 09/29/2022   Atrial fibrillation (HCC) 12/26/2021   Cystocele with uterine prolapse 10/27/2018   Nocturia 12/13/2017   OAB (overactive bladder) 12/13/2017   Elevated LFTs 05/16/2017   Vitamin D   deficiency 05/02/2017   Other fatigue 01/01/2017   Other specified hypothyroidism 01/01/2017   S/P right TKA 05/17/2015   Dyslipidemia 01/06/2014   Arthritis 01/06/2014   S/P laparoscopic sleeve gastrectomy 01/04/14 01/06/2014   Morbid obesity (HCC) 01/04/2014   Essential hypertension 04/21/2007    Allergies:  Allergies  Allergen Reactions   Beef-Derived Drug Products  Anaphylaxis    After tick bite, cannot eat beef, pork or lamb   Lambs Quarters Anaphylaxis    After tick bite cannot eat beef, pork or lamb   Mucinex [Guaifenesin Er] Anaphylaxis   Pork-Derived Products Anaphylaxis    After tick bite cannot eat beef, pork or lamb   Darvon [Propoxyphene] Other (See Comments)    Hallucinations    Ace Inhibitors Swelling and Cough    Pedal Edema   Hydrocodone Itching   Ppd [Tuberculin Purified Protein Derivative]     Always has positive testing to PPD, do not use   Hydromet [Hydrocodone Bit-Homatrop Mbr] Itching and Other (See Comments)    Severe stomach pain-face itches.    Sulfonamide Derivatives Rash   Medications:  Current Outpatient Medications:    busPIRone  (BUSPAR ) 10 MG tablet, Take 2 tablets (20 mg total) by mouth 2 (two) times daily., Disp: 360 tablet, Rfl: 3   cetirizine (ZYRTEC ALLERGY) 10 MG tablet, Take 10 mg by mouth daily., Disp: , Rfl:    cholecalciferol (VITAMIN D3) 25 MCG (1000 UNIT) tablet, Take 1,000 Units by mouth daily., Disp: , Rfl:    diltiazem  (CARDIZEM ) 30 MG tablet, Take one tablet by mouth every 4 hours as needed for afib symptoms only, Disp: 90 tablet, Rfl: 1   dofetilide  (TIKOSYN ) 250 MCG capsule, Take 1 capsule (250 mcg total) by mouth 2 (two) times daily., Disp: 180 capsule, Rfl: 1   ELIQUIS  5 MG TABS tablet, TAKE ONE TABLET BY MOUTH TWICE DAILY, Disp: 180 tablet, Rfl: 1   EPINEPHrine  0.3 mg/0.3 mL IJ SOAJ injection, Inject 0.3 mg into the muscle as needed for anaphylaxis (then call 911/ go to ER)., Disp: 2 each, Rfl: 3   eszopiclone  (LUNESTA ) 1 MG TABS tablet, Take 0.5-1 mg by mouth at bedtime as needed for sleep. Take immediately before bedtime, Disp: , Rfl:    ferrous sulfate  325 (65 FE) MG tablet, Take 325 mg by mouth daily with breakfast., Disp: , Rfl:    furosemide  (LASIX ) 20 MG tablet, TAKE 1 TABLET BY MOUTH ONCE DAILY, Disp: 90 tablet, Rfl: 3   levothyroxine  (SYNTHROID ) 50 MCG tablet, TAKE ONE TABLET BY MOUTH DAILY  BEFORE BREAKFAST, Disp: 90 tablet, Rfl: 3   rosuvastatin  (CRESTOR ) 10 MG tablet, Take 1 tablet (10 mg total) by mouth daily., Disp: 90 tablet, Rfl: 3   sertraline  (ZOLOFT ) 50 MG tablet, Take 1 tablet (50 mg total) by mouth daily., Disp: 90 tablet, Rfl: 3   telmisartan  (MICARDIS ) 80 MG tablet, Take 1 tablet (80 mg total) by mouth daily., Disp: 90 tablet, Rfl: 3   tirzepatide  (ZEPBOUND ) 12.5 MG/0.5ML Pen, Inject 12.5 mg into the skin once a week., Disp: 6 mL, Rfl: 1   tirzepatide  (ZEPBOUND ) 15 MG/0.5ML Pen, Inject 15 mg into the skin once a week., Disp: 6 mL, Rfl: 3   triamcinolone  cream (KENALOG ) 0.1 %, Apply 1 Application topically 2 (two) times daily as needed (itchy rash on back/ neck)., Disp: 80 g, Rfl: 0  Observations/Objective: Patient is well-developed, well-nourished in no acute distress.  Resting comfortably at home.  Head is normocephalic, atraumatic.  No labored breathing.  Speech is clear and coherent with logical content.  Patient is alert and oriented at baseline.  Cough -wet- sound  Hoarseness    Assessment and Plan:  1. Acute bacterial bronchitis (Primary)  - levalbuterol  (XOPENEX  HFA) 45 MCG/ACT inhaler; Inhale 1-2 puffs into the lungs every 4 (four) hours as needed for wheezing.  Dispense: 1 each; Refill: 12 - doxycycline  (VIBRA -TABS) 100 MG tablet; Take 1 tablet (100 mg total) by mouth 2 (two) times daily for 7 days.  Dispense: 14 tablet; Refill: 0 - benzonatate  (TESSALON ) 100 MG capsule; Take 1 capsule (100 mg total) by mouth 3 (three) times daily as needed for cough.  Dispense: 30 capsule; Refill: 0   -Check for COVID  - Take meds as prescribed - Rest voice - Use a cool mist humidifier especially during the winter months when heat dries out the air. - Use saline nose sprays frequently to help soothe nasal passages if they are drying out. - Stay hydrated by drinking plenty of fluids - Keep thermostat turn down low to prevent drying out which can cause a dry  cough.  If you do not improve you will need a follow up visit in person.               Reviewed side effects, risks and benefits of medication.    Patient acknowledged agreement and understanding of the plan.   Past Medical, Surgical, Social History, Allergies, and Medications have been Reviewed.    Follow Up Instructions: I discussed the assessment and treatment plan with the patient. The patient was provided an opportunity to ask questions and all were answered. The patient agreed with the plan and demonstrated an understanding of the instructions.  A copy of instructions were sent to the patient via MyChart unless otherwise noted below.    The patient was advised to call back or seek an in-person evaluation if the symptoms worsen or if the condition fails to improve as anticipated.    Rebekah CHRISTELLA Barefoot, NP

## 2023-12-31 NOTE — Patient Instructions (Addendum)
 Levorn FORBES Clause, thank you for joining [name], NP for today's virtual visit.  While this provider is not your primary care provider (PCP), if your PCP is located in our provider database this encounter information will be shared with them immediately following your visit.   A [facility] MyChart account gives you access to today's visit and all your visits, tests, and labs performed at [facility]  click here if you don't have a [facility] MyChart account or go to [email or web address]  Consent: (Patient) Rebekah [name] provided verbal consent for this virtual visit at the beginning of the encounter.  Current Medications:  Current Outpatient Medications:    benzonatate  (TESSALON ) 100 MG capsule, Take 1 capsule (100 mg total) by mouth 3 (three) times daily as needed for cough., Disp: 30 capsule, Rfl: 0   doxycycline  (VIBRA -TABS) 100 MG tablet, Take 1 tablet (100 mg total) by mouth 2 (two) times daily for 7 days., Disp: 14 tablet, Rfl: 0   levalbuterol  (XOPENEX  HFA) 45 MCG/ACT inhaler, Inhale 1-2 puffs into the lungs every 4 (four) hours as needed for wheezing., Disp: 1 each, Rfl: 12   busPIRone  (BUSPAR ) 10 MG tablet, Take 2 tablets (20 mg total) by mouth 2 (two) times daily., Disp: 360 tablet, Rfl: 3   cetirizine (ZYRTEC ALLERGY) 10 MG tablet, Take 10 mg by mouth daily., Disp: , Rfl:    cholecalciferol (VITAMIN D3) 25 MCG (1000 UNIT) tablet, Take 1,000 Units by mouth daily., Disp: , Rfl:    diltiazem  (CARDIZEM ) 30 MG tablet, Take one tablet by mouth every 4 hours as needed for afib symptoms only, Disp: 90 tablet, Rfl: 1   dofetilide  (TIKOSYN ) 250 MCG capsule, Take 1 capsule (250 mcg total) by mouth 2 (two) times daily., Disp: 180 capsule, Rfl: 1   ELIQUIS  5 MG TABS tablet, TAKE ONE TABLET BY MOUTH TWICE DAILY, Disp: 180 tablet, Rfl: 1   EPINEPHrine  0.3 mg/0.3 mL IJ SOAJ injection, Inject 0.3 mg into the muscle as needed for anaphylaxis (then call 911/ go to ER)., Disp: 2  each, Rfl: 3   eszopiclone  (LUNESTA ) 1 MG TABS tablet, Take 0.5-1 mg by mouth at bedtime as needed for sleep. Take immediately before bedtime, Disp: , Rfl:    ferrous sulfate  325 (65 FE) MG tablet, Take 325 mg by mouth daily with breakfast., Disp: , Rfl:    furosemide  (LASIX ) 20 MG tablet, TAKE 1 TABLET BY MOUTH ONCE DAILY, Disp: 90 tablet, Rfl: 3   levothyroxine  (SYNTHROID ) 50 MCG tablet, TAKE ONE TABLET BY MOUTH DAILY BEFORE BREAKFAST, Disp: 90 tablet, Rfl: 3   rosuvastatin  (CRESTOR ) 10 MG tablet, Take 1 tablet (10 mg total) by mouth daily., Disp: 90 tablet, Rfl: 3   sertraline  (ZOLOFT ) 50 MG tablet, Take 1 tablet (50 mg total) by mouth daily., Disp: 90 tablet, Rfl: 3   telmisartan  (MICARDIS ) 80 MG tablet, Take 1 tablet (80 mg total) by mouth daily., Disp: 90 tablet, Rfl: 3   tirzepatide  (ZEPBOUND ) 12.5 MG/0.5ML Pen, Inject 12.5 mg into the skin once a week., Disp: 6 mL, Rfl: 1   tirzepatide  (ZEPBOUND ) 15 MG/0.5ML Pen, Inject 15 mg into the skin once a week., Disp: 6 mL, Rfl: 3   triamcinolone  cream (KENALOG ) 0.1 %, Apply 1 Application topically 2 (two) times daily as needed (itchy rash on back/ neck)., Disp: 80 g, Rfl: 0   Medications ordered in this encounter:  Meds ordered this encounter  Medications   levalbuterol  (XOPENEX  HFA) 45 MCG/ACT inhaler  Sig: Inhale 1-2 puffs into the lungs every 4 (four) hours as needed for wheezing.    Dispense:  1 each    Refill:  12    Supervising Provider:   BLAISE ALEENE KIDD [[id number]]   doxycycline  (VIBRA -TABS) 100 MG tablet    Sig: Take 1 tablet (100 mg total) by mouth 2 (two) times daily for 7 days.    Dispense:  14 tablet    Refill:  0    Supervising Provider:   LAMPTEY, PHILIP O [[id number]]   benzonatate  (TESSALON ) 100 MG capsule    Sig: Take 1 capsule (100 mg total) by mouth 3 (three) times daily as needed for cough.    Dispense:  30 capsule    Refill:  0    Supervising Provider:   BLAISE ALEENE KIDD [[id number]]     *If you need refills on  other medications prior to your next appointment, please contact your pharmacy*  Follow-Up: Call back or seek an in-person evaluation if the symptoms worsen or if the condition fails to improve as anticipated.  [facility] [phone]  Other Instructions  -Check for COVID - Take meds as prescribed - Rest voice - Use a cool mist humidifier especially during the winter months when heat dries out the air. - Use saline nose sprays frequently to help soothe nasal passages if they are drying out. - Stay hydrated by drinking plenty of fluids - Keep thermostat turn down low to prevent drying out which can cause a dry cough.  If you do not improve you will need a follow up visit in person.    If you have been instructed to have an in-person evaluation today at a local Urgent Care facility, please use the link below. It will take you to a list of all of our available [facility], including address, phone number and hours of operation. Please do not delay care.  [facility]  If you or a family member do not have a primary care provider, use the link below to schedule a visit and establish care. When you choose a [facility] primary care physician or advanced practice provider, you gain a long-term partner in health. Find a Primary Care Provider  Learn more about [facility] in-office and virtual care options:  - Get Care Now

## 2024-01-14 DIAGNOSIS — G4733 Obstructive sleep apnea (adult) (pediatric): Secondary | ICD-10-CM | POA: Diagnosis not present

## 2024-01-24 ENCOUNTER — Ambulatory Visit: Admitting: Gastroenterology

## 2024-02-11 ENCOUNTER — Ambulatory Visit
Admission: RE | Admit: 2024-02-11 | Discharge: 2024-02-11 | Disposition: A | Discharge status: Home / Self Care | Attending: Family Medicine | Admitting: Family Medicine

## 2024-02-11 VITALS — BP 153/100 | HR 77 | Temp 98.2°F | Resp 18

## 2024-02-11 DIAGNOSIS — M62838 Other muscle spasm: Secondary | ICD-10-CM

## 2024-02-11 DIAGNOSIS — S20212A Contusion of left front wall of thorax, initial encounter: Secondary | ICD-10-CM | POA: Diagnosis not present

## 2024-02-11 DIAGNOSIS — J309 Allergic rhinitis, unspecified: Secondary | ICD-10-CM

## 2024-02-11 MED ORDER — OXYCODONE-ACETAMINOPHEN 5-325 MG PO TABS: 1.0000 | ORAL_TABLET | Freq: Three times a day (TID) | ORAL | 0 refills | Status: AC | PRN

## 2024-02-11 MED ORDER — FEXOFENADINE HCL 180 MG PO TABS: 180.0000 mg | ORAL_TABLET | Freq: Every day | ORAL | 0 refills | Status: AC

## 2024-02-11 MED ORDER — METHOCARBAMOL 500 MG PO TABS: 500.0000 mg | ORAL_TABLET | Freq: Three times a day (TID) | ORAL | 0 refills | Status: AC | PRN

## 2024-02-11 NOTE — Discharge Instructions (Addendum)
 Instructed patient to discontinue Zyrtec.  Advised patient to take Allegra daily for the next 5 days.  May use as needed afterwards for concurrent postnasal drainage/drip.  Advised may use Robaxin  daily or as needed for muscle spasms of left chest.  Advised may use Percocet daily or as needed for left sided chest contusion.  Patient has been advised of sedative effects of this medication.  Encouraged to increase daily water  intake to 64 ounces per day while taking these medications.  Advised if symptoms worsen and/or unresolved please follow-up with your PCP or here for further evaluation.

## 2024-02-11 NOTE — ED Provider Notes (Signed)
 Rebekah [name]    CSN: [id number] Arrival date & time: 02/11/24  9157      History   Chief Complaint Chief Complaint  Patient presents with   Chest Injury    9/17 Rx for sinus inf. Freq cough- better. Also, 18# cat jumped on mid-left chest last week. Bruised feel/now also muscle spasms/pain w/movement/breathing. Thought better. Worse Mon eve & overnight. On Eliquis  & dofetilide  & others. Tylenol  650. - Entered by patient    HPI Rebekah [name] is a 68 y.o. female.   HPI Very pleasant 68 year old female presents with chest injury.  Patient is accompanied by her husband this morning.  Patient reports Jumping on her 5 days ago in the middle of the chest which radiates around to her left shoulder.  Reports spasms come and go when she takes a deep breath.  Reports that her cat is about 18 pounds in weight.  PMH significant for morbid/severe obesity, A-fib with RVR, and HTN.  Patient is currently on apixaban  and denies any unusual bleeding.  Past Medical History:  Diagnosis Date   Allergy    Multiple   Anxiety    Arthritis    Atrial fibrillation with rapid ventricular response (HCC) 12/26/2021   Atrial fibrillation with RVR (HCC) 12/27/2021   Bilateral bunions    Cataract    Both eyes- lenses replaced   Chronic kidney disease 2022   Not kidney disease but kidney stone   Complication of anesthesia    Depression    More anxiety-worry-stress   Difficulty sleeping    prior sleep study did not reveal sleep apnea per patient   DJD (degenerative joint disease)    Dyspnea    Dysrhythmia    a-fib   Environmental allergies    Food allergy    Gallbladder problem    GERD (gastroesophageal reflux disease)    Heart murmur    History of kidney stones    Hypertension    no meds after weight loss   Hypothyroidism    Incontinence of urine    at nite    Iron deficiency anemia    Joint pain    Left atrial dilatation    Left foot pain    Leg edema    Obesity    s/p gastric  sleeve 12/2013 (previously weighed close to 400 lbs)   Obstructive sleep apnea 12/07/2022   Osteoarthritis    Oxygen deficiency    3L/min for sleep due to slowed reaperations during sleep   Palpitations    Paroxysmal atrial fibrillation (HCC)    Pneumonia    hx of    PONV (postoperative nausea and vomiting)    Status post bilateral knee replacements    Supplemental oxygen dependent    uses 2l/[place] at night, states HR goes low and O2 drops   Tuberculosis    had 6 month of INH due to exposure    Vaginal vault prolapse     Patient Active Problem List   Diagnosis Date Noted   Paroxysmal atrial fibrillation (HCC) 09/10/2023   Hypothyroidism due to acquired atrophy of thyroid  07/16/2023   CKD stage 3a, GFR 45-59 ml/min (HCC) 07/15/2023   Insomnia 02/27/2023   Lung nodules 02/27/2023   Hypercoagulable state due to paroxysmal atrial fibrillation (HCC) 01/14/2023   Obstructive sleep apnea 12/07/2022   Abnormal CT of the chest 09/29/2022   Atrial fibrillation (HCC) 12/26/2021   Cystocele with uterine prolapse 10/27/2018   Nocturia 12/13/2017   OAB (overactive bladder) 12/13/2017  Elevated LFTs 05/16/2017   Vitamin D  deficiency 05/02/2017   Other fatigue 01/01/2017   Other specified hypothyroidism 01/01/2017   S/P right TKA 05/17/2015   Dyslipidemia 01/06/2014   Arthritis 01/06/2014   S/P laparoscopic sleeve gastrectomy 01/04/14 01/06/2014   Morbid obesity (HCC) 01/04/2014   Essential hypertension 04/21/2007    Past Surgical History:  Procedure Laterality Date   APPENDECTOMY     CHOLECYSTECTOMY     CYSTOSCOPY/URETEROSCOPY/HOLMIUM LASER/STENT PLACEMENT Left 09/29/2020   Procedure: CYSTOSCOPY WITH LEFT RETROGRADE PYELOGRAM AND LEFT URETEROSCOPY/HOLMIUM LASER STONE REMOVAL LEFT /STENT PLACEMENT;  Surgeon: Cam Morene ORN, MD;  Location: [facility];  Service: Urology;  Laterality: Left;   CYSTOSCOPY/URETEROSCOPY/HOLMIUM LASER/STENT PLACEMENT Left 10/12/2022   Procedure: CYSTOSCOPY  LEFT URETEROSCOPY, HOLMIUM LASER LITHOTRIPSY, LEFT URETERAL STENT EXCHANGE;  Surgeon: Cam Morene ORN, MD;  Location: [facility];  Service: Urology;  Laterality: Left;   EYE SURGERY     03/2014 lens implant left lens    FOOT ARTHRODESIS Left 03/15/2016   Procedure: TALONAVICULAR AND SUBTALAR ARTHRODESIS;  Surgeon: Norleen Armor, MD;  Location: [facility];  Service: Orthopedics;  Laterality: Left;   GASTROC RECESSION EXTREMITY Left 03/15/2016   Procedure: LEFT GASTROC RECESSION;  Surgeon: Norleen Armor, MD;  Location: [facility];  Service: Orthopedics;  Laterality: Left;   JOINT REPLACEMENT  04/16/1997   L TOTAL KNEE   LAPAROSCOPIC GASTRIC SLEEVE RESECTION N/A 01/04/2014   Procedure: LAPAROSCOPIC GASTRIC SLEEVE RESECTION;  Surgeon: Camellia Blush, MD;  Location: [facility];  Service: General;  Laterality: N/A;   TONSILLECTOMY     TOTAL KNEE ARTHROPLASTY Right 05/17/2015   Procedure: RIGHT TOTAL KNEE ARTHROPLASTY;  Surgeon: Donnice Car, MD;  Location: [facility];  Service: Orthopedics;  Laterality: Right;   TRANSTHORACIC ECHOCARDIOGRAM  11/14/2012   EF 60-65%, mod LVH & mod conc hypertrophy, grade 1 diastolic dysfunction; LA mildly dilated; RV systolic pressure increased; PA peak pressure   TUBAL LIGATION     UPPER GI ENDOSCOPY  01/04/2014   Procedure: UPPER GI ENDOSCOPY;  Surgeon: Camellia Blush, MD;  Location: [facility];  Service: General;;    OB History     Gravida  2   Para      Term      Preterm      AB      Living  2      SAB      IAB      Ectopic      Multiple      Live Births               Home Medications    Prior to Admission medications   Medication Sig Start Date End Date Taking? Authorizing Provider  benzonatate  (TESSALON ) 100 MG capsule Take 1 capsule (100 mg total) by mouth 3 (three) times daily as needed for cough. 12/31/23  Yes Moishe Chiquita HERO, NP  busPIRone  (BUSPAR ) 10 MG tablet Take 2 tablets (20 mg total) by mouth 2 (two)  times daily. 11/29/23  Yes Gottschalk, Norene M, DO  cetirizine (ZYRTEC ALLERGY) 10 MG tablet Take 10 mg by mouth daily. 06/18/23  Yes [provider]  cholecalciferol (VITAMIN D3) 25 MCG (1000 UNIT) tablet Take 1,000 Units by mouth daily. 06/20/23  Yes [provider]  diltiazem  (CARDIZEM ) 30 MG tablet Take one tablet by mouth every 4 hours as needed for afib symptoms only 10/14/23  Yes Ollis, Brandi L, NP  dofetilide  (TIKOSYN ) 250 MCG capsule Take 1 capsule (250 mcg  total) by mouth 2 (two) times daily. 12/24/23  Yes Terra Fairy PARAS, PA-C  ELIQUIS  5 MG TABS tablet TAKE ONE TABLET BY MOUTH TWICE DAILY 12/09/23  Yes Hilty, Vinie BROCKS, MD  EPINEPHrine  0.3 mg/0.3 mL IJ SOAJ injection Inject 0.3 mg into the muscle as needed for anaphylaxis (then call 911/ go to ER). 07/16/23  Yes Gottschalk, Norene M, DO  eszopiclone  (LUNESTA ) 1 MG TABS tablet Take 0.5-1 mg by mouth at bedtime as needed for sleep. Take immediately before bedtime   Yes [provider]  ferrous sulfate  325 (65 FE) MG tablet Take 325 mg by mouth daily with breakfast. 06/20/23  Yes [provider]  fexofenadine (ALLEGRA ALLERGY) 180 MG tablet Take 1 tablet (180 mg total) by mouth daily for 15 days. 02/11/24 02/26/24 Yes Teddy Sharper, FNP  furosemide  (LASIX ) 20 MG tablet TAKE 1 TABLET BY MOUTH ONCE DAILY 10/29/23  Yes Hilty, Vinie BROCKS, MD  levalbuterol  (XOPENEX  HFA) 45 MCG/ACT inhaler Inhale 1-2 puffs into the lungs every 4 (four) hours as needed for wheezing. 12/31/23  Yes Moishe Chiquita HERO, NP  levothyroxine  (SYNTHROID ) 50 MCG tablet TAKE ONE TABLET BY MOUTH DAILY BEFORE BREAKFAST 11/29/23  Yes Jolinda Norene M, DO  methocarbamol  (ROBAXIN ) 500 MG tablet Take 1 tablet (500 mg total) by mouth 3 (three) times daily as needed. 02/11/24  Yes Teddy Sharper, FNP  oxyCODONE -acetaminophen  (PERCOCET/ROXICET) 5-325 MG tablet Take 1 tablet by mouth every 8 (eight) hours as needed for severe pain (pain score 7-10). 02/11/24  Yes  Teddy Sharper, FNP  rosuvastatin  (CRESTOR ) 10 MG tablet Take 1 tablet (10 mg total) by mouth daily. 07/16/23  Yes Jolinda Norene M, DO  sertraline  (ZOLOFT ) 50 MG tablet Take 1 tablet (50 mg total) by mouth daily. 11/29/23  Yes Jolinda Norene M, DO  telmisartan  (MICARDIS ) 80 MG tablet Take 1 tablet (80 mg total) by mouth daily. 07/16/23  Yes Gottschalk, Norene M, DO  tirzepatide  (ZEPBOUND ) 12.5 MG/0.5ML Pen Inject 12.5 mg into the skin once a week. 11/01/23  Yes Gottschalk, Norene M, DO  tirzepatide  (ZEPBOUND ) 15 MG/0.5ML Pen Inject 15 mg into the skin once a week. 11/29/23  Yes Jolinda Norene M, DO  triamcinolone  cream (KENALOG ) 0.1 % Apply 1 Application topically 2 (two) times daily as needed (itchy rash on back/ neck). 07/15/23  Yes Jolinda Norene HERO, DO    Family History Family History  Problem Relation Age of Onset   Diabetes Father    Hyperlipidemia Father    Hypertension Father    Heart disease Father    Sudden death Father    Anxiety disorder Father    Obesity Father    Varicose Veins Father    Uterine cancer Mother    Cervical cancer Mother    Breast cancer Mother    Cancer Mother        cervical and brreast cancer   Hypertension Mother    Hyperlipidemia Mother    Obesity Mother    Arthritis Mother    Diabetes Maternal Grandmother    Obesity Maternal Grandmother    Diabetes Brother    Hypertension Brother    Stroke Brother    Hypertension Brother    Obesity Daughter     Social History Social History   Tobacco Use   Smoking status: Never    Passive exposure: Never   Smokeless tobacco: Never   Tobacco comments:    Tried 1-2 cigarettes as a child. Nothing else.  Vaping Use   Vaping status: Never  Used  Substance Use Topics   Alcohol use: No   Drug use: No     Allergies   Bovine (beef) protein-containing drug products, Lambs quarters, Mucinex [guaifenesin er], Porcine (pork) protein-containing drug products, Darvon [propoxyphene], Ace inhibitors,  Hydrocodone, Ppd [tuberculin purified protein derivative], Hydromet [hydrocodone bit-homatrop mbr], and Sulfonamide derivatives   Review of Systems Review of Systems  HENT:  Positive for postnasal drip.   Musculoskeletal:        Left sternal border chest contusion per patient from a 18 pound cat jumping on her chest.     Physical Exam Triage Vital Signs ED Triage Vitals  Encounter Vitals Group     BP      Girls Systolic BP Percentile      Girls Diastolic BP Percentile      Boys Systolic BP Percentile      Boys Diastolic BP Percentile      Pulse      Resp      Temp      Temp src      SpO2      Weight      Height      Head Circumference      Peak Flow      Pain Score      Pain Loc      Pain Education      Exclude from Growth Chart    No data found.  Updated Vital Signs BP (!) 153/100 (BP Location: Right Arm)   Pulse 77   Temp 98.2 F (36.8 C) (Oral)   Resp 18   SpO2 96%    Physical Exam Vitals and nursing note reviewed.  Constitutional:      Appearance: Normal appearance. She is obese. She is ill-appearing.  HENT:     Head: Normocephalic and atraumatic.     Right Ear: Tympanic membrane, ear canal and external ear normal.     Left Ear: Tympanic membrane, ear canal and external ear normal.     Mouth/Throat:     Mouth: Mucous membranes are moist.     Pharynx: Oropharynx is clear.  Eyes:     Extraocular Movements: Extraocular movements intact.     Pupils: Pupils are equal, round, and reactive to light.  Cardiovascular:     Pulses: Normal pulses.     Heart sounds: Normal heart sounds.     Comments: Irregularly irregular Pulmonary:     Effort: Pulmonary effort is normal.     Breath sounds: Normal breath sounds. No wheezing, rhonchi or rales.  Musculoskeletal:        General: Normal range of motion.     Comments: Patient reporting pain and tenderness over left sternal border from 18 pound Described as size of raccoon jumped over her shoulder and landed on  her chest 5 days ago, no deformity, no bruising, no ecchymosis  Skin:    General: Skin is warm and dry.  Neurological:     General: No focal deficit present.     Mental Status: She is alert and oriented to person, place, and time. Mental status is at baseline.  Psychiatric:        Mood and Affect: Mood normal.        Behavior: Behavior normal.      UC Treatments / Results  Labs (all labs ordered are listed, but only abnormal results are displayed) Labs Reviewed - No data to display  EKG   Radiology No results found.  Procedures Procedures (including critical  [name] time)  Medications Ordered in UC Medications - No data to display  Initial Impression / Assessment and Plan / UC Course  I have reviewed the triage vital signs and the nursing notes.  Pertinent labs & imaging results that were available during my [name] of the patient were reviewed by me and considered in my medical decision making (see chart for details).     MDM: 1.  Chest wall contusion, left, initial encounter-Rx to Percocet 5/325 mg tablet: Take 1 tablet every 8 hours for chest contusion pain; 2.  Muscle spasm-Rx'd Robaxin  500 mg tablet: Take 1 tablet 3 times daily, as needed for muscle spasm of left chest; 3.  Allergic rhinitis, unspecified seasonality, unspecified trigger. Instructed patient to discontinue Zyrtec.  Advised patient to take Allegra daily for the next 5 days.  May use as needed afterwards for concurrent postnasal drainage/drip.  Advised may use Robaxin  daily or as needed for muscle spasms of left chest.  Advised may use Percocet daily or as needed for left sided chest contusion.  Patient has been advised of sedative effects of this medication.  Encouraged to increase daily water  intake to 64 ounces per day while taking these medications.  Advised if symptoms worsen and/or unresolved please follow-up with your PCP or here for further evaluation.  Patient discharged home, hemodynamically stable Final  Clinical Impressions(s) / UC Diagnoses   Final diagnoses:  Chest wall contusion, left, initial encounter  Muscle spasm  Allergic rhinitis, unspecified seasonality, unspecified trigger     Discharge Instructions      Instructed patient to discontinue Zyrtec.  Advised patient to take Allegra daily for the next 5 days.  May use as needed afterwards for concurrent postnasal drainage/drip.  Advised may use Robaxin  daily or as needed for muscle spasms of left chest.  Advised may use Percocet daily or as needed for left sided chest contusion.  Patient has been advised of sedative effects of this medication.  Encouraged to increase daily water  intake to 64 ounces per day while taking these medications.  Advised if symptoms worsen and/or unresolved please follow-up with your PCP or here for further evaluation.     ED Prescriptions     Medication Sig Dispense Auth. Provider   fexofenadine (ALLEGRA ALLERGY) 180 MG tablet Take 1 tablet (180 mg total) by mouth daily for 15 days. 15 tablet Enyah Moman, FNP   oxyCODONE -acetaminophen  (PERCOCET/ROXICET) 5-325 MG tablet Take 1 tablet by mouth every 8 (eight) hours as needed for severe pain (pain score 7-10). 12 tablet Alisan Dokes, FNP   methocarbamol  (ROBAXIN ) 500 MG tablet Take 1 tablet (500 mg total) by mouth 3 (three) times daily as needed. 30 tablet Annalisse Minkoff, FNP      I have reviewed the PDMP during this encounter.   Teddy Sharper, FNP 02/11/24 1010

## 2024-02-11 NOTE — ED Triage Notes (Signed)
 Patient c/o chest injury from a cat jumping on her 5 days ago.  The pain is in the middle of her chest and radiates around to her left shoulder area.  Spasms come and go, hurts to take a deep breathe.  The cat is about 18lbs.  Taken Arthritis Tylenol .

## 2024-02-18 DIAGNOSIS — G4733 Obstructive sleep apnea (adult) (pediatric): Secondary | ICD-10-CM | POA: Diagnosis not present

## 2024-02-18 DIAGNOSIS — H35372 Puckering of macula, left eye: Secondary | ICD-10-CM | POA: Diagnosis not present

## 2024-02-18 DIAGNOSIS — Z961 Presence of intraocular lens: Secondary | ICD-10-CM | POA: Diagnosis not present

## 2024-02-18 DIAGNOSIS — H18513 Endothelial corneal dystrophy, bilateral: Secondary | ICD-10-CM | POA: Diagnosis not present

## 2024-02-18 DIAGNOSIS — H43813 Vitreous degeneration, bilateral: Secondary | ICD-10-CM | POA: Diagnosis not present

## 2024-02-18 DIAGNOSIS — H47291 Other optic atrophy, right eye: Secondary | ICD-10-CM | POA: Diagnosis not present

## 2024-02-20 ENCOUNTER — Ambulatory Visit: Admitting: Nurse Practitioner

## 2024-03-24 ENCOUNTER — Ambulatory Visit: Admitting: Nurse Practitioner

## 2024-03-25 ENCOUNTER — Ambulatory Visit (HOSPITAL_COMMUNITY): Admitting: Internal Medicine

## 2024-04-06 ENCOUNTER — Encounter (HOSPITAL_COMMUNITY): Payer: Self-pay | Admitting: Internal Medicine

## 2024-04-06 ENCOUNTER — Ambulatory Visit (HOSPITAL_COMMUNITY)
Admission: RE | Admit: 2024-04-06 | Discharge: 2024-04-06 | Disposition: A | Discharge status: Home / Self Care | Source: Ambulatory Visit | Attending: Internal Medicine | Admitting: Internal Medicine

## 2024-04-06 ENCOUNTER — Other Ambulatory Visit (HOSPITAL_COMMUNITY): Payer: Self-pay

## 2024-04-06 VITALS — BP 132/90 | HR 66 | Ht 61.0 in | Wt 271.3 lb

## 2024-04-06 DIAGNOSIS — D6869 Other thrombophilia: Secondary | ICD-10-CM | POA: Diagnosis not present

## 2024-04-06 DIAGNOSIS — Z5181 Encounter for therapeutic drug level monitoring: Secondary | ICD-10-CM | POA: Diagnosis not present

## 2024-04-06 DIAGNOSIS — I480 Paroxysmal atrial fibrillation: Secondary | ICD-10-CM

## 2024-04-06 DIAGNOSIS — Z79899 Other long term (current) drug therapy: Secondary | ICD-10-CM | POA: Diagnosis not present

## 2024-04-06 MED ORDER — FLUZONE HIGH-DOSE 0.5 ML IM SUSY
0.5000 mL | PREFILLED_SYRINGE | Freq: Once | INTRAMUSCULAR | 0 refills | Status: AC
  Filled 2024-04-06: qty 0.5, 1d supply, fill #0

## 2024-04-06 NOTE — Progress Notes (Signed)
 "   Primary Care Physician: Jolinda Norene HERO, DO Primary Cardiologist: Vinie JAYSON Maxcy, MD Electrophysiologist: Will Gladis Norton, MD  Referring Physician: Dr Francyne    Rebekah [name] is a 68 y.o. female with a history of CKD, HTN, OSA, atrial fibrillation who presents for follow up in the [facility]. Patient is on Eliquis  for stroke prevention. She has been maintained on flecainide  for rhythm control.  Patient returns for follow up for atrial fibrillation and flecainide  monitoring. She reports that she was in afib for about 2 weeks in the setting of an acute GI illness. Otherwise, she has been maintaining SR the majority of the time. No bleeding issues on anticoagulation.   On follow up 09/10/23, patient is here for Tikosyn  admission. She stopped flecainide  3 days prior to today's admission. No new medications since last OV. No benadryl  use. No missed doses of anticoagulant.   On follow up 09/20/23, patient is here for Tikosyn  surveillance. S/p Tikosyn  admission 5/27-30/2025. Patient did not require cardioversion. Discharged on 500 mcg BID. Diltiazem  reduced to 120 mg daily.   On follow up 12/24/23, patient is here for Tikosyn  surveillance. She has had overall very low Afib burden since last office visit. She has shown a brief episode of Afib via Kardiamobile strip that actually shows conversion to NSR within 30 seconds. No missed doses of Tikosyn  250 mcg BID. Labs from 8/1 show creatinine of 1.23 and potassium 3.8.   Follow-up 04/06/2024 for Tikosyn  surveillance.  Patient is currently in NSR.  She has had overall very low A-fib burden since last office visit.  She had one likely episode of Afib about a month ago that lasted several hours. No missed doses of Tikosyn .  No bleeding issues on Eliquis .  Today, she  denies symptoms of palpitations, chest pain, shortness of breath, orthopnea, PND, lower extremity edema, dizziness, presyncope, syncope, bleeding, or  neurologic sequela. The patient is tolerating medications without difficulties and is otherwise without complaint today.    Atrial Fibrillation Risk Factors:  she does have symptoms or diagnosis of sleep apnea. she does not have a history of rheumatic fever.   Atrial Fibrillation Management history:  Previous antiarrhythmic drugs: flecainide , tikosyn  Previous cardioversions: none Previous ablations: none Anticoagulation history: Eliquis   ROS- All systems are reviewed and negative except as per the HPI above.  Past Medical History:  Diagnosis Date   Allergy    Multiple   Anxiety    Arthritis    Atrial fibrillation with rapid ventricular response (HCC) 12/26/2021   Atrial fibrillation with RVR (HCC) 12/27/2021   Bilateral bunions    Cataract    Both eyes- lenses replaced   Chronic kidney disease 2022   Not kidney disease but kidney stone   Complication of anesthesia    Depression    More anxiety-worry-stress   Difficulty sleeping    prior sleep study did not reveal sleep apnea per patient   DJD (degenerative joint disease)    Dyspnea    Dysrhythmia    a-fib   Environmental allergies    Food allergy    Gallbladder problem    GERD (gastroesophageal reflux disease)    Heart murmur    History of kidney stones    Hypertension    no meds after weight loss   Hypothyroidism    Incontinence of urine    at nite    Iron deficiency anemia    Joint pain    Left atrial dilatation  Left foot pain    Leg edema    Obesity    s/p gastric sleeve 12/2013 (previously weighed close to 400 lbs)   Obstructive sleep apnea 12/07/2022   Osteoarthritis    Oxygen deficiency    3L/min for sleep due to slowed reaperations during sleep   Palpitations    Paroxysmal atrial fibrillation (HCC)    Pneumonia    hx of    PONV (postoperative nausea and vomiting)    Status post bilateral knee replacements    Supplemental oxygen dependent    uses 2l/[place] at night, states HR goes low and O2  drops   Tuberculosis    had 6 month of INH due to exposure    Vaginal vault prolapse     Current Outpatient Medications  Medication Sig Dispense Refill   benzonatate  (TESSALON ) 100 MG capsule Take 1 capsule (100 mg total) by mouth 3 (three) times daily as needed for cough. 30 capsule 0   busPIRone  (BUSPAR ) 10 MG tablet Take 2 tablets (20 mg total) by mouth 2 (two) times daily. 360 tablet 3   cetirizine (ZYRTEC ALLERGY) 10 MG tablet Take 10 mg by mouth daily.     cholecalciferol (VITAMIN D3) 25 MCG (1000 UNIT) tablet Take 1,000 Units by mouth daily.     diltiazem  (CARDIZEM ) 30 MG tablet Take one tablet by mouth every 4 hours as needed for afib symptoms only 90 tablet 1   dofetilide  (TIKOSYN ) 250 MCG capsule Take 1 capsule (250 mcg total) by mouth 2 (two) times daily. 180 capsule 1   ELIQUIS  5 MG TABS tablet TAKE ONE TABLET BY MOUTH TWICE DAILY 180 tablet 1   EPINEPHrine  0.3 mg/0.3 mL IJ SOAJ injection Inject 0.3 mg into the muscle as needed for anaphylaxis (then call 911/ go to ER). 2 each 3   eszopiclone  (LUNESTA ) 1 MG TABS tablet Take 0.5-1 mg by mouth at bedtime as needed for sleep. Take immediately before bedtime     ferrous sulfate  325 (65 FE) MG tablet Take 325 mg by mouth daily with breakfast.     fexofenadine  (ALLEGRA  ALLERGY) 180 MG tablet Take 1 tablet (180 mg total) by mouth daily for 15 days. 15 tablet 0   furosemide  (LASIX ) 20 MG tablet TAKE 1 TABLET BY MOUTH ONCE DAILY 90 tablet 3   Influenza vac split trivalent PF (FLUZONE  HIGH-DOSE) 0.5 ML injection Inject 0.5 mLs into the muscle once for 1 dose. 0.5 mL 0   levalbuterol  (XOPENEX  HFA) 45 MCG/ACT inhaler Inhale 1-2 puffs into the lungs every 4 (four) hours as needed for wheezing. 1 each 12   levothyroxine  (SYNTHROID ) 50 MCG tablet TAKE ONE TABLET BY MOUTH DAILY BEFORE BREAKFAST 90 tablet 3   methocarbamol  (ROBAXIN ) 500 MG tablet Take 1 tablet (500 mg total) by mouth 3 (three) times daily as needed. 30 tablet 0    oxyCODONE -acetaminophen  (PERCOCET/ROXICET) 5-325 MG tablet Take 1 tablet by mouth every 8 (eight) hours as needed for severe pain (pain score 7-10). 12 tablet 0   rosuvastatin  (CRESTOR ) 10 MG tablet Take 1 tablet (10 mg total) by mouth daily. 90 tablet 3   sertraline  (ZOLOFT ) 50 MG tablet Take 1 tablet (50 mg total) by mouth daily. 90 tablet 3   telmisartan  (MICARDIS ) 80 MG tablet Take 1 tablet (80 mg total) by mouth daily. 90 tablet 3   tirzepatide  (ZEPBOUND ) 12.5 MG/0.5ML Pen Inject 12.5 mg into the skin once a week. 6 mL 1   tirzepatide  (ZEPBOUND ) 15 MG/0.5ML Pen Inject  15 mg into the skin once a week. 6 mL 3   triamcinolone  cream (KENALOG ) 0.1 % Apply 1 Application topically 2 (two) times daily as needed (itchy rash on back/ neck). 80 g 0   No current facility-administered medications for this encounter.    Physical Exam: BP (!) 132/90   Pulse 66   Ht 5' 1 (1.549 m)   Wt 123.1 kg   BMI 51.26 kg/m   GEN- The patient is well appearing, alert and oriented x 3 today.   Neck - no JVD or carotid bruit noted Lungs- Clear to ausculation bilaterally, normal work of breathing Heart- Regular rate and rhythm, no murmurs, rubs or gallops, PMI not laterally displaced Extremities- no clubbing, cyanosis, or edema Skin - no rash or ecchymosis noted   Wt Readings from Last 3 Encounters:  04/06/24 123.1 kg  12/24/23 123.9 kg  11/29/23 123 kg     EKG Interpretation Date/Time:  Monday April 06 2024 09:10:40 EST Ventricular Rate:  66 PR Interval:  166 QRS Duration:  86 QT Interval:  438 QTC Calculation: 459 R Axis:   12  Text Interpretation: Normal sinus rhythm Cannot rule out Anterior infarct , age undetermined Abnormal ECG When compared with ECG of 24-Dec-2023 09:22, No significant change was found Confirmed by Terra Pac ([id number]) on 04/06/2024 9:27:02 AM     Echo 10/08/22 demonstrated   1. Left ventricular ejection fraction, by estimation, is 65 to 70%. The  left ventricle has  normal function. The left ventricle has no regional  wall motion abnormalities. There is mild left ventricular hypertrophy.  Left ventricular diastolic parameters were normal.   2. Right ventricular systolic function is normal. The right ventricular  size is normal. There is normal pulmonary artery systolic pressure. The  estimated right ventricular systolic pressure is 32.6 mmHg.   3. The mitral valve is normal in structure. Trivial mitral valve  regurgitation. No evidence of mitral stenosis.   4. The aortic valve was not well visualized. Aortic valve regurgitation  is trivial. No aortic stenosis is present.   5. Aortic dilatation noted. There is dilatation of the ascending aorta,  measuring 42 mm.   6. The inferior vena cava is normal in size with greater than 50%  respiratory variability, suggesting right atrial pressure of 3 mmHg.   CHA2DS2-VASc Score = 3  The patient's score is based upon: CHF History: 0 HTN History: 1 Diabetes History: 0 Stroke History: 0 Vascular Disease History: 0 Age Score: 1 Gender Score: 1       ASSESSMENT AND PLAN: Paroxysmal Atrial Fibrillation (ICD10:  I48.0) The patient's CHA2DS2-VASc score is 3, indicating a 3.2% annual risk of stroke.   Flecainide  discontinued due to QRS widening. S/p Tikosyn  admission 5/27-30/2025.  Patient is currently in NSR.  She is overall happy with current management and we will not make any changes at this time.  High risk medication monitoring (ICD10: [id number]) Patient requires ongoing monitoring for anti-arrhythmic medication which has the potential to cause life threatening arrhythmias or AV block. Qtc stable. Continue Tikosyn  250 mcg twice daily. BMET and mag level drawn today.  Secondary Hypercoagulable State (ICD10:  D68.69) The patient is at significant risk for stroke/thromboembolism based upon her CHA2DS2-VASc Score of 3.  Continue Apixaban  (Eliquis ).  Continue Eliquis .   HTN Stable  today.  Obesity Body mass index is 51.26 kg/m.  Encouraged daily walking.   OSA  Encouraged nightly CPAP Followed by Dr Shelah   Follow up in 3 months  for Tikosyn  surveillance.      Dorn Heinrich, PA-C [facility] [address] [place], [place] [address] [phone] "

## 2024-04-07 ENCOUNTER — Ambulatory Visit (HOSPITAL_COMMUNITY): Payer: Self-pay | Admitting: Internal Medicine

## 2024-04-07 LAB — BASIC METABOLIC PANEL WITH GFR
BUN/Creatinine Ratio: 16 (ref 12–28)
BUN: 15 mg/dL (ref 8–27)
CO2: 22 mmol/L (ref 20–29)
Calcium: 10.2 mg/dL (ref 8.7–10.3)
Chloride: 104 mmol/L (ref 96–106)
Creatinine, Ser: 0.94 mg/dL (ref 0.57–1.00)
Glucose: 89 mg/dL (ref 70–99)
Potassium: 4 mmol/L (ref 3.5–5.2)
Sodium: 144 mmol/L (ref 134–144)
eGFR: 66 mL/min/1.73

## 2024-04-07 LAB — MAGNESIUM: Magnesium: 2.1 mg/dL (ref 1.6–2.3)

## 2024-04-10 ENCOUNTER — Other Ambulatory Visit: Payer: Self-pay | Admitting: Family Medicine

## 2024-04-10 DIAGNOSIS — E785 Hyperlipidemia, unspecified: Secondary | ICD-10-CM

## 2024-04-10 DIAGNOSIS — T782XXD Anaphylactic shock, unspecified, subsequent encounter: Secondary | ICD-10-CM

## 2024-04-21 DIAGNOSIS — I10 Essential (primary) hypertension: Secondary | ICD-10-CM

## 2024-04-21 DIAGNOSIS — N1831 Chronic kidney disease, stage 3a: Secondary | ICD-10-CM

## 2024-04-23 ENCOUNTER — Ambulatory Visit: Admitting: Nurse Practitioner

## 2024-04-23 ENCOUNTER — Telehealth: Admitting: Nurse Practitioner

## 2024-04-23 ENCOUNTER — Encounter: Payer: Self-pay | Admitting: Nurse Practitioner

## 2024-04-23 DIAGNOSIS — G4733 Obstructive sleep apnea (adult) (pediatric): Secondary | ICD-10-CM

## 2024-04-23 DIAGNOSIS — R5382 Chronic fatigue, unspecified: Secondary | ICD-10-CM | POA: Diagnosis not present

## 2024-04-23 DIAGNOSIS — R918 Other nonspecific abnormal finding of lung field: Secondary | ICD-10-CM

## 2024-04-23 NOTE — Progress Notes (Signed)
 "  Patient ID: Rebekah [name], female     DOB: 09-Apr-1956, 69 y.o.      MRN: [id number]  Chief Complaint  Patient presents with   Sleep Apnea    Follow up     Virtual Visit via Video Note  I connected with Rebekah [name] on 04/23/2024 at 10:00 AM EST by a video enabled telemedicine application and verified that I am speaking with the correct person using two identifiers.  Location: Patient: Home Provider: Office    I discussed the limitations of evaluation and management by telemedicine and the availability of in person appointments. The patient expressed understanding and agreed to proceed.  History of Present Illness: 69 year old female, never smoker followed for lung nodules and OSA on CPAP.  She is a patient Rebekah [name] and last seen 05/01/2023 by Rebekah [name].  Past medical history significant for A-fib on diltiazem  and Eliquis , hypertension, PAH, hypothyroid, HLD, obesity.   TEST/EVENTS:  10/08/2022 echo: EF 65 to 70%.  RV size and function is normal.  Normal PASP.  Trivial MR.  Trivial AR.  Aortic dilatation. 11/20/2022 HST: AHI 20.2, SpO2 low 54% 12/03/2022 super D CT chest: Atherosclerosis.  Enlarged pulmonic trunk.  No suspicious pulmonary nodules.   12/07/2022: OV with Rebekah [name].  Seen in June for abnormal CT of the chest that showed some scattered patchy groundglass opacities, scattered solid and semisolid nodules including 8 mm mixed density nodule in the left upper lobe, 4 mm apical upper lobe nodules and 7 mm right upper lobe nodule.  Has a history of latent TB that was treated.  Repeat CT from August 2024 shows possible left upper lobe nodule but the other nodules appear resolved.  CT also showed enlarged RV and dilated main PA suggestive of possible pulmonary hypertension.  PSG was ordered given her cardiac history and findings on CT imaging to rule out underlying OSA.  She had a home sleep study that revealed moderate sleep apnea.  Willing to try CPAP -orders placed for auto CPAP 8-20  cmH2O.   02/27/2023: OV with Rebekah Boehme [name] for follow-up after starting on CPAP with her husband.  She has been wearing her CPAP most nights.  Unfortunately she is having a lot of difficulties with it.  She feels like she does not ever sleep when she is wearing it.  Tends to only get about 2 to 3 hours a night and then she is awake or tossing and turning the rest of the night.  She does keep the CPAP on even when she is awake.  She did not wear it the other night and slept for 8-1/2 hours, which she has not done in quite some time.  Feels like she is just is tired, if not more tired than when she started on therapy.  She does have a lot of issues with mask fit.  She has tried multiple different fullface masks.  The DME company told her that she would not be able to do a nasal mask but did not necessarily provide her with a reason other than it would not work for her.  She is currently waiting on a [employer]ear full facemask and size medium, which has already been mailed to her.  Feels like sometimes the pressures blow too hard.  She does have a daughter and friends who are on CPAP, who get a lot of benefit from use.  She wishes that she would feel this way.  She does have arthritis pains so tends to  sleep in a recliner. She denies any sleep parasomnia/paralysis.  No issues with drowsy driving. 01/27/2023-02/25/2023: CPAP auto to 8-20 cmH2O 27/30 days; 87% >4 hr; average use 6 hours 47 minutes Pressure 95th 13.2 Leaks 95th 20.7 AHI 3.9  05/01/2023: Virtual visit with Rebekah Skalsky [name] Patient presents today for follow up via video visit. We had started her on Lunesta  at our last visit because she was having issues with sleep maintenance. She started this at 1 mg and went up to 2 mg. Feels like she would do better on 1.5 mg. She did feel like it helped her get back to sleep and have more restful sleep compared to when she doesn't take it. She had some concerns that were brought up by a geriatric provider she is close with  regarding the sleep aid. We discussed this in a previous MyChart encounter. She feels comfortable using it now but understands to monitor for grogginess and use caution when getting up at night to avoid falls. She also doesn't like to use it every night but overall, things are going okay with it. No significant morning grogginess.  No sleep parasomnias.  Regarding her CPAP, she did go for the mask fitting and switched to a nasal pillow. She likes this better than the full face. Feels like it fits well. We had also adjusted her pressure settings. With these changes, her leakage is minimal and she has better control. She did recently have norovirus. Feeling tired from this but slowly recovering. Denies any drowsy driving or morning headaches. 03/31/2023-04/29/2023: CPAP 8-13 cmH2O 28/30 days; 93% >4 hr; average use 6 hr 32 min Pressure 95th 12.3 Leaks 95th 3.7 AHI 0.9   04/23/2024: Today - follow up Patient presents today for follow up. She has been doing well with her CPAP. She wears it most nights. No issues with pressures or mask fit. She does not have any residual snoring with the CPAP. She does feel she is better with it than without it. She still has issues with her sleep at night being very fragmented. She's only able to get about 2-4 hours. Her sleep issues are related to chronic pain from her arthritis. She is working with Rebekah [name] on this. She's on Tikosyn , which limits her medication options. She did try the lunesta  but it didn't seem to make a difference in her sleep so she's no longer taking this. No drowsy driving.   01/23/2024-04/21/2024: CPAP 8-13 cmH2O 78/90 days; 87% >4 hr; average use 7 hr 52 min Pressure 95th 11.8 Leaks 95th 9.4 AHI 0.9  Allergies  Allergen Reactions   Bovine (Beef) Protein-Containing Drug Products Anaphylaxis    After tick bite, cannot eat beef, pork or lamb   Lambs Quarters Anaphylaxis    After tick bite cannot eat beef, pork or lamb   Mucinex [Guaifenesin  Er] Anaphylaxis   Porcine (Pork) Protein-Containing Drug Products Anaphylaxis    After tick bite cannot eat beef, pork or lamb   Darvon [Propoxyphene] Other (See Comments)    Hallucinations    Ace Inhibitors Swelling and Cough    Pedal Edema   Hydrocodone Itching   Ppd [Tuberculin Purified Protein Derivative]     Always has positive testing to PPD, do not use   Hydromet [Hydrocodone Bit-Homatrop Mbr] Itching and Other (See Comments)    Severe stomach pain-face itches.    Sulfonamide Derivatives Rash   Immunization History  Administered Date(s) Administered   Fluad Quad(high Dose 65+) 12/25/2018   Fluad Trivalent(High Dose 65+) 01/01/2023  INFLUENZA, HIGH DOSE SEASONAL PF 04/06/2024   Influenza Split 02/15/2012   Influenza,inj,Quad PF,6+ Mos 01/06/2014, 01/30/2017, 01/26/2018   Influenza-Unspecified 01/26/2018, 02/08/2020   Moderna Covid-19 Fall Seasonal Vaccine 12yrs & older 01/01/2023   PFIZER(Purple Top)SARS-COV-2 Vaccination 08/17/2019, 09/07/2019, 02/08/2020   PNEUMOCOCCAL CONJUGATE-20 05/17/2021   Tdap 07/15/2023   Zoster Recombinant(Shingrix) 11/14/2020, 05/17/2021   Zoster, Live 11/24/2015   Past Medical History:  Diagnosis Date   Allergy    Multiple   Anxiety    Arthritis    Atrial fibrillation with rapid ventricular response (HCC) 12/26/2021   Atrial fibrillation with RVR (HCC) 12/27/2021   Bilateral bunions    Cataract    Both eyes- lenses replaced   Chronic kidney disease 2022   Not kidney disease but kidney stone   Complication of anesthesia    Depression    More anxiety-worry-stress   Difficulty sleeping    prior sleep study did not reveal sleep apnea per patient   DJD (degenerative joint disease)    Dyspnea    Dysrhythmia    a-fib   Environmental allergies    Food allergy    Gallbladder problem    GERD (gastroesophageal reflux disease)    Heart murmur    History of kidney stones    Hypertension    no meds after weight loss   Hypothyroidism     Incontinence of urine    at nite    Iron deficiency anemia    Joint pain    Left atrial dilatation    Left foot pain    Leg edema    Obesity    s/p gastric sleeve 12/2013 (previously weighed close to 400 lbs)   Obstructive sleep apnea 12/07/2022   Osteoarthritis    Oxygen deficiency    3L/min for sleep due to slowed reaperations during sleep   Palpitations    Paroxysmal atrial fibrillation (HCC)    Pneumonia    hx of    PONV (postoperative nausea and vomiting)    Status post bilateral knee replacements    Supplemental oxygen dependent    uses 2l/[place] at night, states HR goes low and O2 drops   Tuberculosis    had 6 month of INH due to exposure    Vaginal vault prolapse     Tobacco History: Social History   Tobacco Use  Smoking Status Never   Passive exposure: Never  Smokeless Tobacco Never  Tobacco Comments   Tried 1-2 cigarettes as a child. Nothing else.   Counseling given: Not Answered Tobacco comments: Tried 1-2 cigarettes as a child. Nothing else.   Outpatient Medications Prior to Visit  Medication Sig Dispense Refill   benzonatate  (TESSALON ) 100 MG capsule Take 1 capsule (100 mg total) by mouth 3 (three) times daily as needed for cough. 30 capsule 0   busPIRone  (BUSPAR ) 10 MG tablet Take 2 tablets (20 mg total) by mouth 2 (two) times daily. 360 tablet 3   cetirizine (ZYRTEC ALLERGY) 10 MG tablet Take 10 mg by mouth daily.     cholecalciferol (VITAMIN D3) 25 MCG (1000 UNIT) tablet Take 1,000 Units by mouth daily.     diltiazem  (CARDIZEM ) 30 MG tablet Take one tablet by mouth every 4 hours as needed for afib symptoms only 90 tablet 1   dofetilide  (TIKOSYN ) 250 MCG capsule Take 1 capsule (250 mcg total) by mouth 2 (two) times daily. 180 capsule 1   ELIQUIS  5 MG TABS tablet TAKE ONE TABLET BY MOUTH TWICE DAILY 180 tablet 1  EPINEPHRINE  0.3 mg/0.3 mL IJ SOAJ injection Inject 0.3 mg into the muscle as needed for anaphylaxis (then call 911/ go to ER). 2 each 3    ferrous sulfate  325 (65 FE) MG tablet Take 325 mg by mouth daily with breakfast.     fexofenadine  (ALLEGRA  ALLERGY) 180 MG tablet Take 1 tablet (180 mg total) by mouth daily for 15 days. 15 tablet 0   furosemide  (LASIX ) 20 MG tablet TAKE 1 TABLET BY MOUTH ONCE DAILY 90 tablet 3   levalbuterol  (XOPENEX  HFA) 45 MCG/ACT inhaler Inhale 1-2 puffs into the lungs every 4 (four) hours as needed for wheezing. 1 each 12   levothyroxine  (SYNTHROID ) 50 MCG tablet TAKE ONE TABLET BY MOUTH DAILY BEFORE BREAKFAST 90 tablet 3   methocarbamol  (ROBAXIN ) 500 MG tablet Take 1 tablet (500 mg total) by mouth 3 (three) times daily as needed. 30 tablet 0   oxyCODONE -acetaminophen  (PERCOCET/ROXICET) 5-325 MG tablet Take 1 tablet by mouth every 8 (eight) hours as needed for severe pain (pain score 7-10). 12 tablet 0   rosuvastatin  (CRESTOR ) 10 MG tablet Take 1 tablet (10 mg total) by mouth daily. 90 tablet 3   sertraline  (ZOLOFT ) 50 MG tablet Take 1 tablet (50 mg total) by mouth daily. 90 tablet 3   telmisartan  (MICARDIS ) 80 MG tablet Take 1 tablet (80 mg total) by mouth daily. 90 tablet 0   tirzepatide  (ZEPBOUND ) 12.5 MG/0.5ML Pen Inject 12.5 mg into the skin once a week. 6 mL 1   tirzepatide  (ZEPBOUND ) 15 MG/0.5ML Pen Inject 15 mg into the skin once a week. 6 mL 3   triamcinolone  cream (KENALOG ) 0.1 % Apply 1 Application topically 2 (two) times daily as needed (itchy rash on back/ neck). 80 g 0   eszopiclone  (LUNESTA ) 1 MG TABS tablet Take 0.5-1 mg by mouth at bedtime as needed for sleep. Take immediately before bedtime     No facility-administered medications prior to visit.     Review of Systems: as above  Observations/Objective: Patient is well-developed, well-nourished in no acute distress. A&Ox3. Resting comfortably at home. Unlabored breathing. Speech is clear and coherent with logical content.   Assessment and Plan: Obstructive sleep apnea Moderate OSA on CPAP. Excellent compliance and control. Receives  benefit from use. Still with daytime fatigue due to sleep disruptions related to arthritis/chronic pain. Encouraged to discuss with Rebekah [name] regarding pain management. No perceived benefit with sleep aid, likely due to underlying cause being pain and not true insomnia. Encouraged to continue utilizing CPAP nightly. Understands proper use/care of device. Aware of risks of untreated OSA. Safe driving practices.   Patient Instructions  Continue to use CPAP every night, minimum of 4-6 hours a night.  Change equipment as directed. Wash your tubing with warm soap and water  daily, hang to dry. Wash humidifier portion weekly. Use bottled, distilled water  and change daily Be aware of reduced alertness and do not drive or operate heavy machinery if experiencing this or drowsiness.  Exercise encouraged, as tolerated. Healthy weight management discussed.  Avoid or decrease alcohol consumption and medications that make you more sleepy, if possible. Notify if persistent daytime sleepiness occurs even with consistent use of PAP therapy.  Change CPAP supplies... Every month Mask cushions and/or nasal pillows CPAP machine filters Every 3 months Mask frame (not including the headgear) CPAP tubing Every 6 months Mask headgear Chin strap (if applicable) Humidifier water  tub   Follow up with Dr. Fernanda in one year at the [facility] office (new pt 30  min slot), or sooner, if needed    Lung nodules Resolved on repeat imaging 11/2022. Never smoker. No need for dedicated follow up.   Fatigue Chronic fatigue related to frequent night awakenings related to chronic pain. See above. Sleep hygiene reviewed.      I discussed the assessment and treatment plan with the patient. The patient was provided an opportunity to ask questions and all were answered. The patient agreed with the plan and demonstrated an understanding of the instructions.   The patient was advised to call back or seek an in-person  evaluation if the symptoms worsen or if the condition fails to improve as anticipated.  I provided 25 minutes of non-face-to-face time during this encounter.   Rebekah LULLA Rouleau, [name]   "

## 2024-04-23 NOTE — Assessment & Plan Note (Addendum)
 Moderate OSA on CPAP. Excellent compliance and control. Receives benefit from use. Still with daytime fatigue due to sleep disruptions related to arthritis/chronic pain. Encouraged to discuss with Dr. Mai regarding pain management. No perceived benefit with sleep aid, likely due to underlying cause being pain and not true insomnia. Encouraged to continue utilizing CPAP nightly. Understands proper use/care of device. Aware of risks of untreated OSA. Safe driving practices.   Patient Instructions  Continue to use CPAP every night, minimum of 4-6 hours a night.  Change equipment as directed. Wash your tubing with warm soap and water  daily, hang to dry. Wash humidifier portion weekly. Use bottled, distilled water  and change daily Be aware of reduced alertness and do not drive or operate heavy machinery if experiencing this or drowsiness.  Exercise encouraged, as tolerated. Healthy weight management discussed.  Avoid or decrease alcohol consumption and medications that make you more sleepy, if possible. Notify if persistent daytime sleepiness occurs even with consistent use of PAP therapy.  Change CPAP supplies... Every month Mask cushions and/or nasal pillows CPAP machine filters Every 3 months Mask frame (not including the headgear) CPAP tubing Every 6 months Mask headgear Chin strap (if applicable) Humidifier water  tub   Follow up with Dr. Fernanda in one year at the [facility] office (new pt 30 min slot), or sooner, if needed

## 2024-04-23 NOTE — Assessment & Plan Note (Signed)
 Chronic fatigue related to frequent night awakenings related to chronic pain. See above. Sleep hygiene reviewed.

## 2024-04-23 NOTE — Assessment & Plan Note (Signed)
 Resolved on repeat imaging 11/2022. Never smoker. No need for dedicated follow up.

## 2024-04-23 NOTE — Patient Instructions (Addendum)
 Continue to use CPAP every night, minimum of 4-6 hours a night.  Change equipment as directed. Wash your tubing with warm soap and water  daily, hang to dry. Wash humidifier portion weekly. Use bottled, distilled water  and change daily Be aware of reduced alertness and do not drive or operate heavy machinery if experiencing this or drowsiness.  Exercise encouraged, as tolerated. Healthy weight management discussed.  Avoid or decrease alcohol consumption and medications that make you more sleepy, if possible. Notify if persistent daytime sleepiness occurs even with consistent use of PAP therapy.  Change CPAP supplies... Every month Mask cushions and/or nasal pillows CPAP machine filters Every 3 months Mask frame (not including the headgear) CPAP tubing Every 6 months Mask headgear Chin strap (if applicable) Humidifier water  tub   Follow up with Dr. Fernanda in one year at the [facility] office (new pt 30 min slot), or sooner, if needed

## 2024-04-27 ENCOUNTER — Ambulatory Visit: Admitting: Internal Medicine

## 2024-04-27 ENCOUNTER — Ambulatory Visit: Admitting: Student

## 2024-05-04 ENCOUNTER — Other Ambulatory Visit (HOSPITAL_BASED_OUTPATIENT_CLINIC_OR_DEPARTMENT_OTHER): Payer: Self-pay

## 2024-05-04 MED ORDER — TIRZEPATIDE-WEIGHT MANAGEMENT 15 MG/0.5ML ~~LOC~~ SOAJ
15.0000 mg | SUBCUTANEOUS | 3 refills | Status: AC
  Filled 2024-05-04: qty 2, 28d supply, fill #0

## 2024-05-08 ENCOUNTER — Other Ambulatory Visit

## 2024-06-09 ENCOUNTER — Ambulatory Visit: Payer: Self-pay | Admitting: Family Medicine

## 2024-06-12 ENCOUNTER — Ambulatory Visit: Admitting: Internal Medicine

## 2024-07-27 ENCOUNTER — Ambulatory Visit (HOSPITAL_COMMUNITY): Admitting: Internal Medicine

## 2024-12-07 ENCOUNTER — Encounter: Payer: Self-pay | Admitting: Family Medicine
